# Patient Record
Sex: Male | Born: 1945 | Race: Black or African American | Hispanic: No | Smoking: Former smoker
Health system: Southern US, Community
[De-identification: ages and names within clinical notes are randomized; demographics above are authoritative.]

## PROBLEM LIST (undated history)

## (undated) DIAGNOSIS — R058 Adverse effect of angiotensin-converting-enzyme inhibitors, initial encounter: Secondary | ICD-10-CM

## (undated) DIAGNOSIS — I10 Essential (primary) hypertension: Secondary | ICD-10-CM

## (undated) DIAGNOSIS — R05 Cough: Secondary | ICD-10-CM

## (undated) DIAGNOSIS — I509 Heart failure, unspecified: Secondary | ICD-10-CM

## (undated) DIAGNOSIS — R002 Palpitations: Secondary | ICD-10-CM

## (undated) DIAGNOSIS — F1021 Alcohol dependence, in remission: Secondary | ICD-10-CM

## (undated) DIAGNOSIS — I2699 Other pulmonary embolism without acute cor pulmonale: Secondary | ICD-10-CM

## (undated) DIAGNOSIS — E1140 Type 2 diabetes mellitus with diabetic neuropathy, unspecified: Secondary | ICD-10-CM

## (undated) DIAGNOSIS — R0602 Shortness of breath: Secondary | ICD-10-CM

## (undated) DIAGNOSIS — J45909 Unspecified asthma, uncomplicated: Secondary | ICD-10-CM

## (undated) DIAGNOSIS — Z87891 Personal history of nicotine dependence: Secondary | ICD-10-CM

## (undated) DIAGNOSIS — D649 Anemia, unspecified: Secondary | ICD-10-CM

## (undated) DIAGNOSIS — T464X5A Adverse effect of angiotensin-converting-enzyme inhibitors, initial encounter: Secondary | ICD-10-CM

## (undated) DIAGNOSIS — G4730 Sleep apnea, unspecified: Secondary | ICD-10-CM

## (undated) DIAGNOSIS — M1990 Unspecified osteoarthritis, unspecified site: Secondary | ICD-10-CM

## (undated) DIAGNOSIS — H5440 Blindness, one eye, unspecified eye: Secondary | ICD-10-CM

## (undated) DIAGNOSIS — E11319 Type 2 diabetes mellitus with unspecified diabetic retinopathy without macular edema: Secondary | ICD-10-CM

## (undated) DIAGNOSIS — E119 Type 2 diabetes mellitus without complications: Secondary | ICD-10-CM

## (undated) DIAGNOSIS — N529 Male erectile dysfunction, unspecified: Secondary | ICD-10-CM

## (undated) DIAGNOSIS — E7800 Pure hypercholesterolemia, unspecified: Secondary | ICD-10-CM

## (undated) DIAGNOSIS — C61 Malignant neoplasm of prostate: Secondary | ICD-10-CM

## (undated) DIAGNOSIS — F411 Generalized anxiety disorder: Secondary | ICD-10-CM

## (undated) HISTORY — PX: BACK SURGERY: SHX140

## (undated) HISTORY — DX: Pure hypercholesterolemia, unspecified: E78.00

## (undated) HISTORY — DX: Essential (primary) hypertension: I10

## (undated) HISTORY — DX: Unspecified osteoarthritis, unspecified site: M19.90

## (undated) HISTORY — DX: Palpitations: R00.2

## (undated) HISTORY — DX: Type 2 diabetes mellitus with diabetic neuropathy, unspecified: E11.40

## (undated) HISTORY — DX: Other pulmonary embolism without acute cor pulmonale: I26.99

## (undated) HISTORY — DX: Type 2 diabetes mellitus without complications: E11.9

## (undated) HISTORY — DX: Personal history of nicotine dependence: Z87.891

## (undated) HISTORY — DX: Alcohol dependence, in remission: F10.21

## (undated) HISTORY — DX: Unspecified asthma, uncomplicated: J45.909

## (undated) HISTORY — DX: Male erectile dysfunction, unspecified: N52.9

## (undated) HISTORY — DX: Morbid (severe) obesity due to excess calories: E66.01

## (undated) HISTORY — DX: Adverse effect of angiotensin-converting-enzyme inhibitors, initial encounter: R05.8

## (undated) HISTORY — DX: Malignant neoplasm of prostate: C61

## (undated) HISTORY — PX: COLONOSCOPY: SHX174

## (undated) HISTORY — DX: Adverse effect of angiotensin-converting-enzyme inhibitors, initial encounter: T46.4X5A

## (undated) HISTORY — DX: Generalized anxiety disorder: F41.1

## (undated) HISTORY — DX: Anemia, unspecified: D64.9

## (undated) HISTORY — PX: TRACHEOSTOMY: SUR1362

## (undated) HISTORY — DX: Blindness, one eye, unspecified eye: H54.40

## (undated) HISTORY — DX: Type 2 diabetes mellitus with unspecified diabetic retinopathy without macular edema: E11.319

## (undated) HISTORY — DX: Heart failure, unspecified: I50.9

## (undated) HISTORY — DX: Cough: R05

---

## 1997-10-30 ENCOUNTER — Ambulatory Visit (HOSPITAL_COMMUNITY): Admission: RE | Admit: 1997-10-30 | Discharge: 1997-10-30 | Payer: Self-pay | Admitting: Sports Medicine

## 1999-03-27 ENCOUNTER — Emergency Department (HOSPITAL_COMMUNITY): Admission: EM | Admit: 1999-03-27 | Discharge: 1999-03-28 | Payer: Self-pay | Admitting: *Deleted

## 1999-03-28 ENCOUNTER — Encounter: Payer: Self-pay | Admitting: Emergency Medicine

## 1999-04-08 ENCOUNTER — Ambulatory Visit (HOSPITAL_COMMUNITY): Admission: RE | Admit: 1999-04-08 | Discharge: 1999-04-08 | Payer: Self-pay | Admitting: Endocrinology

## 1999-04-08 ENCOUNTER — Encounter: Payer: Self-pay | Admitting: Endocrinology

## 1999-05-20 ENCOUNTER — Encounter: Payer: Self-pay | Admitting: Endocrinology

## 1999-05-20 ENCOUNTER — Ambulatory Visit (HOSPITAL_COMMUNITY): Admission: RE | Admit: 1999-05-20 | Discharge: 1999-05-20 | Payer: Self-pay | Admitting: Endocrinology

## 1999-06-27 ENCOUNTER — Encounter: Payer: Self-pay | Admitting: *Deleted

## 1999-06-27 ENCOUNTER — Ambulatory Visit (HOSPITAL_COMMUNITY): Admission: RE | Admit: 1999-06-27 | Discharge: 1999-06-27 | Payer: Self-pay | Admitting: *Deleted

## 1999-09-06 ENCOUNTER — Encounter: Admission: RE | Admit: 1999-09-06 | Discharge: 1999-09-06 | Payer: Self-pay | Admitting: Specialist

## 1999-09-06 ENCOUNTER — Encounter: Payer: Self-pay | Admitting: Specialist

## 1999-11-02 ENCOUNTER — Ambulatory Visit (HOSPITAL_COMMUNITY): Admission: RE | Admit: 1999-11-02 | Discharge: 1999-11-02 | Payer: Self-pay | Admitting: Cardiology

## 1999-11-02 ENCOUNTER — Encounter: Payer: Self-pay | Admitting: *Deleted

## 2000-11-16 ENCOUNTER — Inpatient Hospital Stay (HOSPITAL_COMMUNITY): Admission: EM | Admit: 2000-11-16 | Discharge: 2000-11-24 | Payer: Self-pay | Admitting: Cardiology

## 2000-11-16 ENCOUNTER — Encounter: Payer: Self-pay | Admitting: Cardiology

## 2000-11-17 ENCOUNTER — Encounter: Payer: Self-pay | Admitting: Cardiology

## 2002-09-13 ENCOUNTER — Emergency Department (HOSPITAL_COMMUNITY): Admission: EM | Admit: 2002-09-13 | Discharge: 2002-09-13 | Payer: Self-pay | Admitting: Emergency Medicine

## 2002-09-15 ENCOUNTER — Emergency Department (HOSPITAL_COMMUNITY): Admission: EM | Admit: 2002-09-15 | Discharge: 2002-09-16 | Payer: Self-pay | Admitting: Emergency Medicine

## 2002-09-16 ENCOUNTER — Ambulatory Visit (HOSPITAL_COMMUNITY): Admission: RE | Admit: 2002-09-16 | Discharge: 2002-09-16 | Payer: Self-pay | Admitting: Emergency Medicine

## 2002-10-23 ENCOUNTER — Encounter: Payer: Self-pay | Admitting: Endocrinology

## 2002-10-23 ENCOUNTER — Ambulatory Visit (HOSPITAL_COMMUNITY): Admission: RE | Admit: 2002-10-23 | Discharge: 2002-10-23 | Payer: Self-pay | Admitting: Endocrinology

## 2003-07-26 ENCOUNTER — Emergency Department (HOSPITAL_COMMUNITY): Admission: EM | Admit: 2003-07-26 | Discharge: 2003-07-26 | Payer: Self-pay | Admitting: Emergency Medicine

## 2003-07-29 ENCOUNTER — Encounter: Admission: RE | Admit: 2003-07-29 | Discharge: 2003-07-29 | Payer: Self-pay | Admitting: Orthopedic Surgery

## 2003-11-18 ENCOUNTER — Ambulatory Visit: Payer: Self-pay | Admitting: Endocrinology

## 2003-12-04 ENCOUNTER — Ambulatory Visit: Payer: Self-pay | Admitting: Endocrinology

## 2004-03-18 ENCOUNTER — Ambulatory Visit: Payer: Self-pay | Admitting: Endocrinology

## 2004-03-23 ENCOUNTER — Ambulatory Visit: Payer: Self-pay | Admitting: Endocrinology

## 2004-03-30 ENCOUNTER — Ambulatory Visit: Payer: Self-pay | Admitting: Endocrinology

## 2004-04-11 ENCOUNTER — Ambulatory Visit: Payer: Self-pay | Admitting: Internal Medicine

## 2004-04-20 ENCOUNTER — Ambulatory Visit: Payer: Self-pay | Admitting: Internal Medicine

## 2004-04-20 LAB — HM COLONOSCOPY

## 2004-06-23 ENCOUNTER — Ambulatory Visit: Payer: Self-pay | Admitting: Endocrinology

## 2004-06-30 ENCOUNTER — Ambulatory Visit: Payer: Self-pay | Admitting: Endocrinology

## 2004-07-06 ENCOUNTER — Ambulatory Visit: Payer: Self-pay | Admitting: Internal Medicine

## 2004-07-06 ENCOUNTER — Ambulatory Visit: Payer: Self-pay | Admitting: Cardiology

## 2004-07-06 ENCOUNTER — Encounter: Payer: Self-pay | Admitting: Cardiology

## 2004-07-06 ENCOUNTER — Inpatient Hospital Stay (HOSPITAL_COMMUNITY): Admission: EM | Admit: 2004-07-06 | Discharge: 2004-07-29 | Payer: Self-pay | Admitting: Emergency Medicine

## 2004-07-08 ENCOUNTER — Ambulatory Visit: Payer: Self-pay | Admitting: Internal Medicine

## 2004-07-10 ENCOUNTER — Encounter: Payer: Self-pay | Admitting: Critical Care Medicine

## 2004-08-16 ENCOUNTER — Ambulatory Visit (HOSPITAL_COMMUNITY): Admission: RE | Admit: 2004-08-16 | Discharge: 2004-08-16 | Payer: Self-pay | Admitting: Internal Medicine

## 2004-09-07 ENCOUNTER — Ambulatory Visit: Payer: Self-pay | Admitting: Internal Medicine

## 2004-09-07 ENCOUNTER — Ambulatory Visit: Payer: Self-pay | Admitting: Endocrinology

## 2004-09-09 ENCOUNTER — Ambulatory Visit: Payer: Self-pay | Admitting: Endocrinology

## 2004-09-14 ENCOUNTER — Ambulatory Visit: Payer: Self-pay | Admitting: *Deleted

## 2004-09-20 ENCOUNTER — Ambulatory Visit: Payer: Self-pay | Admitting: Endocrinology

## 2004-09-23 ENCOUNTER — Ambulatory Visit: Payer: Self-pay | Admitting: Endocrinology

## 2004-09-30 ENCOUNTER — Ambulatory Visit: Payer: Self-pay | Admitting: Cardiology

## 2004-10-03 ENCOUNTER — Ambulatory Visit: Payer: Self-pay | Admitting: Cardiology

## 2004-10-07 ENCOUNTER — Ambulatory Visit: Payer: Self-pay | Admitting: Internal Medicine

## 2004-10-11 ENCOUNTER — Ambulatory Visit: Payer: Self-pay | Admitting: *Deleted

## 2004-10-25 ENCOUNTER — Ambulatory Visit: Payer: Self-pay | Admitting: Cardiovascular Disease

## 2004-10-28 ENCOUNTER — Ambulatory Visit: Payer: Self-pay | Admitting: Internal Medicine

## 2004-11-12 ENCOUNTER — Emergency Department (HOSPITAL_COMMUNITY): Admission: EM | Admit: 2004-11-12 | Discharge: 2004-11-12 | Payer: Self-pay | Admitting: Emergency Medicine

## 2004-11-15 ENCOUNTER — Ambulatory Visit: Payer: Self-pay | Admitting: Internal Medicine

## 2004-11-15 ENCOUNTER — Ambulatory Visit: Payer: Self-pay | Admitting: Cardiology

## 2004-11-25 ENCOUNTER — Ambulatory Visit: Payer: Self-pay | Admitting: Cardiology

## 2004-12-19 ENCOUNTER — Ambulatory Visit: Payer: Self-pay | Admitting: Cardiology

## 2005-01-10 ENCOUNTER — Ambulatory Visit: Payer: Self-pay | Admitting: Internal Medicine

## 2005-01-11 ENCOUNTER — Ambulatory Visit: Payer: Self-pay | Admitting: Internal Medicine

## 2005-02-03 ENCOUNTER — Ambulatory Visit: Payer: Self-pay | Admitting: Cardiology

## 2005-03-10 ENCOUNTER — Ambulatory Visit: Payer: Self-pay | Admitting: Internal Medicine

## 2005-03-31 ENCOUNTER — Ambulatory Visit: Payer: Self-pay | Admitting: Endocrinology

## 2005-04-07 ENCOUNTER — Ambulatory Visit: Payer: Self-pay | Admitting: Endocrinology

## 2005-04-07 ENCOUNTER — Ambulatory Visit: Payer: Self-pay | Admitting: Cardiovascular Disease

## 2005-04-28 ENCOUNTER — Ambulatory Visit: Payer: Self-pay | Admitting: Cardiology

## 2005-05-05 ENCOUNTER — Ambulatory Visit: Payer: Self-pay | Admitting: Endocrinology

## 2005-05-29 ENCOUNTER — Ambulatory Visit: Payer: Self-pay | Admitting: Endocrinology

## 2005-06-01 ENCOUNTER — Ambulatory Visit: Payer: Self-pay | Admitting: Endocrinology

## 2005-06-28 ENCOUNTER — Ambulatory Visit: Payer: Self-pay | Admitting: Cardiovascular Disease

## 2005-06-28 ENCOUNTER — Emergency Department (HOSPITAL_COMMUNITY): Admission: EM | Admit: 2005-06-28 | Discharge: 2005-06-28 | Payer: Self-pay | Admitting: Emergency Medicine

## 2005-06-30 ENCOUNTER — Ambulatory Visit: Payer: Self-pay | Admitting: Cardiology

## 2005-07-07 ENCOUNTER — Ambulatory Visit: Payer: Self-pay | Admitting: Endocrinology

## 2005-07-07 ENCOUNTER — Ambulatory Visit: Payer: Self-pay | Admitting: Internal Medicine

## 2005-09-04 ENCOUNTER — Ambulatory Visit: Payer: Self-pay | Admitting: Internal Medicine

## 2005-10-02 ENCOUNTER — Ambulatory Visit: Payer: Self-pay | Admitting: Cardiology

## 2005-10-05 ENCOUNTER — Ambulatory Visit: Payer: Self-pay | Admitting: Cardiology

## 2005-10-06 ENCOUNTER — Ambulatory Visit: Payer: Self-pay | Admitting: Endocrinology

## 2005-10-10 ENCOUNTER — Ambulatory Visit: Payer: Self-pay | Admitting: Cardiovascular Disease

## 2005-10-23 ENCOUNTER — Ambulatory Visit: Admission: RE | Admit: 2005-10-23 | Discharge: 2005-10-23 | Payer: Self-pay | Admitting: Endocrinology

## 2005-10-27 ENCOUNTER — Ambulatory Visit: Payer: Self-pay | Admitting: Endocrinology

## 2005-11-20 ENCOUNTER — Ambulatory Visit: Payer: Self-pay | Admitting: Cardiology

## 2005-11-23 ENCOUNTER — Ambulatory Visit: Payer: Self-pay | Admitting: Endocrinology

## 2005-12-01 ENCOUNTER — Emergency Department (HOSPITAL_COMMUNITY): Admission: EM | Admit: 2005-12-01 | Discharge: 2005-12-01 | Payer: Self-pay | Admitting: Emergency Medicine

## 2005-12-18 ENCOUNTER — Ambulatory Visit: Payer: Self-pay | Admitting: Internal Medicine

## 2006-01-23 ENCOUNTER — Ambulatory Visit: Payer: Self-pay | Admitting: Cardiology

## 2006-02-20 ENCOUNTER — Ambulatory Visit: Payer: Self-pay | Admitting: Cardiology

## 2006-02-27 ENCOUNTER — Ambulatory Visit: Payer: Self-pay | Admitting: Cardiology

## 2006-03-27 ENCOUNTER — Ambulatory Visit: Payer: Self-pay | Admitting: Cardiology

## 2006-04-19 ENCOUNTER — Ambulatory Visit: Payer: Self-pay | Admitting: Endocrinology

## 2006-04-19 LAB — CONVERTED CEMR LAB
ALT: 13 units/L (ref 0–40)
AST: 18 units/L (ref 0–37)
Albumin: 3.2 g/dL — ABNORMAL LOW (ref 3.5–5.2)
Alkaline Phosphatase: 64 units/L (ref 39–117)
BUN: 22 mg/dL (ref 6–23)
Bacteria, UA: NEGATIVE
Basophils Absolute: 0.1 10*3/uL (ref 0.0–0.1)
Basophils Relative: 0.7 % (ref 0.0–1.0)
Bilirubin Urine: NEGATIVE
Bilirubin, Direct: 0.1 mg/dL (ref 0.0–0.3)
CO2: 32 meq/L (ref 19–32)
Calcium: 9.4 mg/dL (ref 8.4–10.5)
Chloride: 106 meq/L (ref 96–112)
Cholesterol: 125 mg/dL (ref 0–200)
Creatinine, Ser: 1.2 mg/dL (ref 0.4–1.5)
Creatinine,U: 296.1 mg/dL
Crystals: NEGATIVE
Eosinophils Absolute: 0.1 10*3/uL (ref 0.0–0.6)
Eosinophils Relative: 1.8 % (ref 0.0–5.0)
GFR calc Af Amer: 79 mL/min
GFR calc non Af Amer: 66 mL/min
Glucose, Bld: 100 mg/dL — ABNORMAL HIGH (ref 70–99)
HCT: 36.9 % — ABNORMAL LOW (ref 39.0–52.0)
HDL: 43.7 mg/dL (ref >39.0)
Hemoglobin, Urine: NEGATIVE
Hemoglobin: 12.3 g/dL — ABNORMAL LOW (ref 13.0–17.0)
Hgb A1c MFr Bld: 6 % (ref 4.6–6.0)
Ketones, ur: NEGATIVE mg/dL
LDL Cholesterol: 70 mg/dL (ref 0–99)
Leukocytes, UA: NEGATIVE
Lymphocytes Relative: 20.2 % (ref 12.0–46.0)
MCHC: 33.4 g/dL (ref 30.0–36.0)
MCV: 86.5 fL (ref 78.0–100.0)
Microalb Creat Ratio: 24.3 mg/g (ref 0.0–30.0)
Microalb, Ur: 7.2 mg/dL — ABNORMAL HIGH (ref 0.0–1.9)
Monocytes Absolute: 0.5 10*3/uL (ref 0.2–0.7)
Monocytes Relative: 6.2 % (ref 3.0–11.0)
Neutro Abs: 5.5 10*3/uL (ref 1.4–7.7)
Neutrophils Relative %: 71.1 % (ref 43.0–77.0)
Nitrite: NEGATIVE
PSA: 2.3 ng/mL (ref 0.10–4.00)
Platelets: 236 10*3/uL (ref 150–400)
Potassium: 4 meq/L (ref 3.5–5.1)
RBC: 4.27 M/uL (ref 4.22–5.81)
RDW: 13.1 % (ref 11.5–14.6)
Sodium: 142 meq/L (ref 135–145)
Specific Gravity, Urine: >=1.03 (ref 1.000–1.03)
TSH: 1.12 microintl units/mL (ref 0.35–5.50)
Total Bilirubin: 0.8 mg/dL (ref 0.3–1.2)
Total CHOL/HDL Ratio: 2.9
Total Protein: 7.1 g/dL (ref 6.0–8.3)
Triglycerides: 56 mg/dL (ref 0–149)
Urine Glucose: NEGATIVE mg/dL
Urobilinogen, UA: 0.2 (ref 0.0–1.0)
VLDL: 11 mg/dL (ref 0–40)
Vitamin B-12: 949 pg/mL — ABNORMAL HIGH (ref 211–911)
WBC: 7.8 10*3/uL (ref 4.5–10.5)
pH: 6 (ref 5.0–8.0)

## 2006-04-24 ENCOUNTER — Ambulatory Visit: Payer: Self-pay | Admitting: Cardiology

## 2006-05-28 ENCOUNTER — Ambulatory Visit: Payer: Self-pay | Admitting: Internal Medicine

## 2006-05-29 ENCOUNTER — Ambulatory Visit: Payer: Self-pay | Admitting: Endocrinology

## 2006-06-28 ENCOUNTER — Ambulatory Visit: Payer: Self-pay | Admitting: Cardiology

## 2006-07-23 ENCOUNTER — Encounter: Payer: Self-pay | Admitting: Endocrinology

## 2006-07-23 DIAGNOSIS — J45909 Unspecified asthma, uncomplicated: Secondary | ICD-10-CM | POA: Insufficient documentation

## 2006-07-23 DIAGNOSIS — F411 Generalized anxiety disorder: Secondary | ICD-10-CM

## 2006-07-23 DIAGNOSIS — M1990 Unspecified osteoarthritis, unspecified site: Secondary | ICD-10-CM | POA: Insufficient documentation

## 2006-07-23 DIAGNOSIS — I10 Essential (primary) hypertension: Secondary | ICD-10-CM

## 2006-07-23 HISTORY — DX: Unspecified asthma, uncomplicated: J45.909

## 2006-07-23 HISTORY — DX: Essential (primary) hypertension: I10

## 2006-07-23 HISTORY — DX: Unspecified osteoarthritis, unspecified site: M19.90

## 2006-07-23 HISTORY — DX: Generalized anxiety disorder: F41.1

## 2006-07-25 ENCOUNTER — Ambulatory Visit: Payer: Self-pay | Admitting: Cardiology

## 2006-08-16 ENCOUNTER — Ambulatory Visit: Payer: Self-pay | Admitting: Cardiology

## 2006-08-29 ENCOUNTER — Ambulatory Visit: Payer: Self-pay | Admitting: Endocrinology

## 2006-08-29 LAB — CONVERTED CEMR LAB
ALT: 20 units/L (ref 0–53)
AST: 19 units/L (ref 0–37)
Albumin: 3.2 g/dL — ABNORMAL LOW (ref 3.5–5.2)
Alkaline Phosphatase: 67 units/L (ref 39–117)
Bilirubin, Direct: 0.1 mg/dL (ref 0.0–0.3)
Cholesterol: 87 mg/dL (ref 0–200)
HDL: 38.9 mg/dL — ABNORMAL LOW (ref >39.0)
Hgb A1c MFr Bld: 6.6 % — ABNORMAL HIGH (ref 4.6–6.0)
LDL Cholesterol: 42 mg/dL (ref 0–99)
Total Bilirubin: 0.8 mg/dL (ref 0.3–1.2)
Total CHOL/HDL Ratio: 2.2
Total Protein: 7.1 g/dL (ref 6.0–8.3)
Triglycerides: 30 mg/dL (ref 0–149)
VLDL: 6 mg/dL (ref 0–40)

## 2006-08-30 ENCOUNTER — Ambulatory Visit: Payer: Self-pay | Admitting: Cardiovascular Disease

## 2006-09-20 ENCOUNTER — Ambulatory Visit: Payer: Self-pay | Admitting: Cardiology

## 2006-09-26 ENCOUNTER — Ambulatory Visit: Payer: Self-pay | Admitting: Endocrinology

## 2006-10-30 ENCOUNTER — Ambulatory Visit: Payer: Self-pay | Admitting: Cardiology

## 2006-11-27 ENCOUNTER — Ambulatory Visit: Payer: Self-pay | Admitting: Cardiovascular Disease

## 2007-02-04 ENCOUNTER — Ambulatory Visit: Payer: Self-pay | Admitting: Cardiology

## 2007-03-13 ENCOUNTER — Ambulatory Visit: Payer: Self-pay | Admitting: Internal Medicine

## 2007-05-21 ENCOUNTER — Encounter: Payer: Self-pay | Admitting: Endocrinology

## 2007-05-23 ENCOUNTER — Ambulatory Visit: Payer: Self-pay | Admitting: Endocrinology

## 2007-05-23 DIAGNOSIS — E7800 Pure hypercholesterolemia, unspecified: Secondary | ICD-10-CM | POA: Insufficient documentation

## 2007-05-23 DIAGNOSIS — R9431 Abnormal electrocardiogram [ECG] [EKG]: Secondary | ICD-10-CM | POA: Insufficient documentation

## 2007-05-23 DIAGNOSIS — I428 Other cardiomyopathies: Secondary | ICD-10-CM

## 2007-05-23 DIAGNOSIS — M25519 Pain in unspecified shoulder: Secondary | ICD-10-CM

## 2007-05-23 DIAGNOSIS — E119 Type 2 diabetes mellitus without complications: Secondary | ICD-10-CM

## 2007-05-23 HISTORY — DX: Pure hypercholesterolemia, unspecified: E78.00

## 2007-05-23 HISTORY — DX: Type 2 diabetes mellitus without complications: E11.9

## 2007-05-24 ENCOUNTER — Telehealth: Payer: Self-pay | Admitting: Endocrinology

## 2007-05-27 ENCOUNTER — Encounter: Payer: Self-pay | Admitting: Endocrinology

## 2007-05-27 LAB — CONVERTED CEMR LAB
ALT: 20 units/L (ref 0–53)
AST: 25 units/L (ref 0–37)
Albumin: 3.7 g/dL (ref 3.5–5.2)
Alkaline Phosphatase: 65 units/L (ref 39–117)
BUN: 21 mg/dL (ref 6–23)
Basophils Absolute: 0 10*3/uL (ref 0.0–0.1)
Basophils Relative: 0.5 % (ref 0.0–1.0)
Bilirubin Urine: NEGATIVE
Bilirubin, Direct: 0.1 mg/dL (ref 0.0–0.3)
CO2: 30 meq/L (ref 19–32)
Calcium: 9.5 mg/dL (ref 8.4–10.5)
Chloride: 104 meq/L (ref 96–112)
Cholesterol: 104 mg/dL (ref 0–200)
Creatinine, Ser: 1.3 mg/dL (ref 0.4–1.5)
Creatinine,U: 291.2 mg/dL
Crystals: NEGATIVE
Eosinophils Absolute: 0.1 10*3/uL (ref 0.0–0.7)
Eosinophils Relative: 1 % (ref 0.0–5.0)
GFR calc Af Amer: 72 mL/min
GFR calc non Af Amer: 60 mL/min
Glucose, Bld: 101 mg/dL — ABNORMAL HIGH (ref 70–99)
HCT: 39.5 % (ref 39.0–52.0)
HDL: 38.5 mg/dL — ABNORMAL LOW (ref >39.0)
Hemoglobin, Urine: NEGATIVE
Hemoglobin: 12.8 g/dL — ABNORMAL LOW (ref 13.0–17.0)
Hgb A1c MFr Bld: 6.4 % — ABNORMAL HIGH (ref 4.6–6.0)
Ketones, ur: NEGATIVE mg/dL
LDL Cholesterol: 56 mg/dL (ref 0–99)
Leukocytes, UA: NEGATIVE
Lymphocytes Relative: 17 % (ref 12.0–46.0)
MCHC: 32.5 g/dL (ref 30.0–36.0)
MCV: 85.6 fL (ref 78.0–100.0)
Microalb Creat Ratio: 76.9 mg/g — ABNORMAL HIGH (ref 0.0–30.0)
Microalb, Ur: 22.4 mg/dL — ABNORMAL HIGH (ref 0.0–1.9)
Monocytes Absolute: 0.4 10*3/uL (ref 0.1–1.0)
Monocytes Relative: 5.4 % (ref 3.0–12.0)
Neutro Abs: 5.4 10*3/uL (ref 1.4–7.7)
Neutrophils Relative %: 76.1 % (ref 43.0–77.0)
Nitrite: NEGATIVE
PSA: 4.36 ng/mL — ABNORMAL HIGH (ref 0.10–4.00)
Platelets: 189 10*3/uL (ref 150–400)
Potassium: 3.3 meq/L — ABNORMAL LOW (ref 3.5–5.1)
RBC: 4.61 M/uL (ref 4.22–5.81)
RDW: 12.5 % (ref 11.5–14.6)
Sed Rate: 59 mm/hr — ABNORMAL HIGH (ref 0–16)
Sodium: 144 meq/L (ref 135–145)
Specific Gravity, Urine: >=1.03 (ref 1.000–1.03)
TSH: 1.25 microintl units/mL (ref 0.35–5.50)
Total Bilirubin: 0.9 mg/dL (ref 0.3–1.2)
Total CHOL/HDL Ratio: 2.7
Total Protein, Urine: 100 mg/dL — AB
Total Protein: 7.5 g/dL (ref 6.0–8.3)
Triglycerides: 49 mg/dL (ref 0–149)
Urine Glucose: NEGATIVE mg/dL
Urobilinogen, UA: 1 (ref 0.0–1.0)
VLDL: 10 mg/dL (ref 0–40)
WBC: 7.1 10*3/uL (ref 4.5–10.5)
pH: 6 (ref 5.0–8.0)

## 2007-05-29 ENCOUNTER — Ambulatory Visit: Payer: Self-pay | Admitting: Cardiology

## 2007-06-12 ENCOUNTER — Ambulatory Visit: Payer: Self-pay | Admitting: Cardiology

## 2007-06-18 ENCOUNTER — Ambulatory Visit: Payer: Self-pay | Admitting: Cardiology

## 2007-06-18 LAB — CONVERTED CEMR LAB: Pro B Natriuretic peptide (BNP): 27 pg/mL (ref 0.0–100.0)

## 2007-07-03 ENCOUNTER — Encounter: Payer: Self-pay | Admitting: Cardiology

## 2007-07-03 ENCOUNTER — Ambulatory Visit: Payer: Self-pay

## 2007-07-08 ENCOUNTER — Ambulatory Visit: Payer: Self-pay

## 2007-08-29 ENCOUNTER — Ambulatory Visit: Payer: Self-pay | Admitting: Endocrinology

## 2007-08-29 DIAGNOSIS — R0609 Other forms of dyspnea: Secondary | ICD-10-CM

## 2007-08-29 DIAGNOSIS — R0989 Other specified symptoms and signs involving the circulatory and respiratory systems: Secondary | ICD-10-CM

## 2007-09-09 ENCOUNTER — Ambulatory Visit: Payer: Self-pay | Admitting: Internal Medicine

## 2007-09-16 ENCOUNTER — Telehealth (INDEPENDENT_AMBULATORY_CARE_PROVIDER_SITE_OTHER): Payer: Self-pay | Admitting: *Deleted

## 2007-10-04 ENCOUNTER — Encounter: Payer: Self-pay | Admitting: Internal Medicine

## 2007-10-11 ENCOUNTER — Telehealth (INDEPENDENT_AMBULATORY_CARE_PROVIDER_SITE_OTHER): Payer: Self-pay | Admitting: *Deleted

## 2007-10-21 ENCOUNTER — Ambulatory Visit: Payer: Self-pay | Admitting: Endocrinology

## 2007-12-02 ENCOUNTER — Ambulatory Visit: Payer: Self-pay | Admitting: Cardiology

## 2008-01-01 ENCOUNTER — Telehealth (INDEPENDENT_AMBULATORY_CARE_PROVIDER_SITE_OTHER): Payer: Self-pay | Admitting: *Deleted

## 2008-01-23 ENCOUNTER — Telehealth (INDEPENDENT_AMBULATORY_CARE_PROVIDER_SITE_OTHER): Payer: Self-pay | Admitting: *Deleted

## 2008-01-27 ENCOUNTER — Telehealth (INDEPENDENT_AMBULATORY_CARE_PROVIDER_SITE_OTHER): Payer: Self-pay | Admitting: *Deleted

## 2008-01-30 ENCOUNTER — Ambulatory Visit: Payer: Self-pay | Admitting: Endocrinology

## 2008-01-30 LAB — CONVERTED CEMR LAB: Hgb A1c MFr Bld: 7 % — ABNORMAL HIGH (ref 4.6–6.0)

## 2008-02-04 ENCOUNTER — Telehealth (INDEPENDENT_AMBULATORY_CARE_PROVIDER_SITE_OTHER): Payer: Self-pay | Admitting: *Deleted

## 2008-02-10 ENCOUNTER — Ambulatory Visit: Payer: Self-pay | Admitting: Endocrinology

## 2008-02-10 DIAGNOSIS — R002 Palpitations: Secondary | ICD-10-CM

## 2008-02-10 HISTORY — DX: Palpitations: R00.2

## 2008-03-02 ENCOUNTER — Telehealth (INDEPENDENT_AMBULATORY_CARE_PROVIDER_SITE_OTHER): Payer: Self-pay | Admitting: *Deleted

## 2008-04-14 ENCOUNTER — Telehealth: Payer: Self-pay | Admitting: Endocrinology

## 2008-05-18 ENCOUNTER — Encounter: Payer: Self-pay | Admitting: Internal Medicine

## 2008-06-16 ENCOUNTER — Encounter: Payer: Self-pay | Admitting: *Deleted

## 2008-06-16 ENCOUNTER — Telehealth: Payer: Self-pay | Admitting: Endocrinology

## 2008-07-22 ENCOUNTER — Encounter: Payer: Self-pay | Admitting: *Deleted

## 2008-07-31 ENCOUNTER — Telehealth: Payer: Self-pay | Admitting: Endocrinology

## 2008-08-07 ENCOUNTER — Telehealth: Payer: Self-pay | Admitting: Endocrinology

## 2008-08-26 ENCOUNTER — Encounter (INDEPENDENT_AMBULATORY_CARE_PROVIDER_SITE_OTHER): Payer: Self-pay | Admitting: *Deleted

## 2008-08-26 ENCOUNTER — Ambulatory Visit: Payer: Self-pay | Admitting: Endocrinology

## 2008-08-26 ENCOUNTER — Encounter: Payer: Self-pay | Admitting: Cardiology

## 2008-08-26 DIAGNOSIS — Z87891 Personal history of nicotine dependence: Secondary | ICD-10-CM | POA: Insufficient documentation

## 2008-08-26 DIAGNOSIS — F1021 Alcohol dependence, in remission: Secondary | ICD-10-CM

## 2008-08-26 HISTORY — DX: Alcohol dependence, in remission: F10.21

## 2008-08-26 HISTORY — DX: Personal history of nicotine dependence: Z87.891

## 2008-08-31 ENCOUNTER — Telehealth: Payer: Self-pay | Admitting: Endocrinology

## 2008-08-31 DIAGNOSIS — D649 Anemia, unspecified: Secondary | ICD-10-CM

## 2008-08-31 DIAGNOSIS — R809 Proteinuria, unspecified: Secondary | ICD-10-CM | POA: Insufficient documentation

## 2008-08-31 HISTORY — DX: Anemia, unspecified: D64.9

## 2008-08-31 LAB — CONVERTED CEMR LAB
ALT: 19 units/L (ref 0–53)
AST: 21 units/L (ref 0–37)
Albumin: 3.7 g/dL (ref 3.5–5.2)
Alkaline Phosphatase: 64 units/L (ref 39–117)
BUN: 27 mg/dL — ABNORMAL HIGH (ref 6–23)
Basophils Absolute: 0.1 10*3/uL (ref 0.0–0.1)
Basophils Relative: 0.8 % (ref 0.0–3.0)
Bilirubin Urine: NEGATIVE
Bilirubin, Direct: 0.2 mg/dL (ref 0.0–0.3)
CO2: 34 meq/L — ABNORMAL HIGH (ref 19–32)
Calcium: 9.5 mg/dL (ref 8.4–10.5)
Chloride: 102 meq/L (ref 96–112)
Cholesterol: 97 mg/dL (ref 0–200)
Creatinine, Ser: 1.4 mg/dL (ref 0.4–1.5)
Creatinine,U: 190.9 mg/dL
Eosinophils Absolute: 0.1 10*3/uL (ref 0.0–0.7)
Eosinophils Relative: 1.5 % (ref 0.0–5.0)
GFR calc non Af Amer: 65.93 mL/min (ref >60)
Glucose, Bld: 68 mg/dL — ABNORMAL LOW (ref 70–99)
HCT: 38 % — ABNORMAL LOW (ref 39.0–52.0)
HDL: 42.4 mg/dL (ref >39.00)
Hemoglobin, Urine: NEGATIVE
Hemoglobin: 12.9 g/dL — ABNORMAL LOW (ref 13.0–17.0)
Hgb A1c MFr Bld: 6.7 % — ABNORMAL HIGH (ref 4.6–6.5)
Ketones, ur: NEGATIVE mg/dL
LDL Cholesterol: 46 mg/dL (ref 0–99)
Leukocytes, UA: NEGATIVE
Lymphocytes Relative: 30.1 % (ref 12.0–46.0)
Lymphs Abs: 2.2 10*3/uL (ref 0.7–4.0)
MCHC: 34 g/dL (ref 30.0–36.0)
MCV: 86.2 fL (ref 78.0–100.0)
Microalb Creat Ratio: 79.6 mg/g — ABNORMAL HIGH (ref 0.0–30.0)
Microalb, Ur: 15.2 mg/dL — ABNORMAL HIGH (ref 0.0–1.9)
Monocytes Absolute: 0.4 10*3/uL (ref 0.1–1.0)
Monocytes Relative: 5.3 % (ref 3.0–12.0)
Neutro Abs: 4.4 10*3/uL (ref 1.4–7.7)
Neutrophils Relative %: 62.3 % (ref 43.0–77.0)
Nitrite: NEGATIVE
PSA: 6.23 ng/mL — ABNORMAL HIGH (ref 0.10–4.00)
Platelets: 189 10*3/uL (ref 150.0–400.0)
Potassium: 3.5 meq/L (ref 3.5–5.1)
RBC: 4.4 M/uL (ref 4.22–5.81)
RDW: 13.3 % (ref 11.5–14.6)
Sodium: 142 meq/L (ref 135–145)
Specific Gravity, Urine: 1.025 (ref 1.000–1.030)
TSH: 2.28 microintl units/mL (ref 0.35–5.50)
Total Bilirubin: 0.7 mg/dL (ref 0.3–1.2)
Total CHOL/HDL Ratio: 2
Total Protein, Urine: 30 mg/dL
Total Protein: 7.4 g/dL (ref 6.0–8.3)
Triglycerides: 41 mg/dL (ref 0.0–149.0)
Urine Glucose: NEGATIVE mg/dL
Urobilinogen, UA: 0.2 (ref 0.0–1.0)
VLDL: 8.2 mg/dL (ref 0.0–40.0)
WBC: 7.2 10*3/uL (ref 4.5–10.5)
pH: 6 (ref 5.0–8.0)

## 2008-09-22 ENCOUNTER — Encounter: Payer: Self-pay | Admitting: Endocrinology

## 2008-09-29 ENCOUNTER — Telehealth: Payer: Self-pay | Admitting: Endocrinology

## 2008-10-26 ENCOUNTER — Telehealth: Payer: Self-pay | Admitting: Endocrinology

## 2008-11-04 ENCOUNTER — Ambulatory Visit: Payer: Self-pay | Admitting: Internal Medicine

## 2008-11-04 LAB — CONVERTED CEMR LAB: POC INR: 3.5

## 2008-11-05 ENCOUNTER — Telehealth: Payer: Self-pay | Admitting: Endocrinology

## 2008-11-23 ENCOUNTER — Ambulatory Visit: Payer: Self-pay | Admitting: Endocrinology

## 2008-11-23 DIAGNOSIS — R635 Abnormal weight gain: Secondary | ICD-10-CM | POA: Insufficient documentation

## 2008-11-23 LAB — CONVERTED CEMR LAB: Hgb A1c MFr Bld: 7 % — ABNORMAL HIGH (ref 4.6–6.5)

## 2008-11-25 ENCOUNTER — Ambulatory Visit: Payer: Self-pay | Admitting: Internal Medicine

## 2008-11-25 LAB — CONVERTED CEMR LAB: POC INR: 3.7

## 2008-11-30 ENCOUNTER — Telehealth: Payer: Self-pay | Admitting: Endocrinology

## 2008-12-09 ENCOUNTER — Ambulatory Visit: Payer: Self-pay | Admitting: Internal Medicine

## 2008-12-09 LAB — CONVERTED CEMR LAB: POC INR: 2.3

## 2008-12-14 ENCOUNTER — Ambulatory Visit: Payer: Self-pay | Admitting: Cardiology

## 2008-12-14 LAB — CONVERTED CEMR LAB: POC INR: 1.4

## 2009-01-04 ENCOUNTER — Ambulatory Visit (HOSPITAL_COMMUNITY): Admission: RE | Admit: 2009-01-04 | Discharge: 2009-01-04 | Payer: Self-pay | Admitting: Urology

## 2009-01-26 ENCOUNTER — Encounter: Payer: Self-pay | Admitting: Endocrinology

## 2009-02-01 ENCOUNTER — Ambulatory Visit: Admission: RE | Admit: 2009-02-01 | Discharge: 2009-05-02 | Payer: Self-pay | Admitting: Radiation Oncology

## 2009-02-02 ENCOUNTER — Encounter: Payer: Self-pay | Admitting: Endocrinology

## 2009-02-08 ENCOUNTER — Telehealth: Payer: Self-pay | Admitting: Endocrinology

## 2009-02-08 ENCOUNTER — Encounter: Payer: Self-pay | Admitting: Endocrinology

## 2009-02-19 ENCOUNTER — Ambulatory Visit: Payer: Self-pay | Admitting: Endocrinology

## 2009-02-24 ENCOUNTER — Ambulatory Visit: Payer: Self-pay | Admitting: Cardiology

## 2009-02-24 LAB — CONVERTED CEMR LAB: POC INR: 2.2

## 2009-03-03 ENCOUNTER — Encounter: Payer: Self-pay | Admitting: Internal Medicine

## 2009-03-08 ENCOUNTER — Ambulatory Visit: Payer: Self-pay | Admitting: Internal Medicine

## 2009-03-08 LAB — CONVERTED CEMR LAB: POC INR: 1.2

## 2009-03-09 ENCOUNTER — Encounter: Payer: Self-pay | Admitting: Endocrinology

## 2009-03-22 ENCOUNTER — Ambulatory Visit: Payer: Self-pay | Admitting: Endocrinology

## 2009-04-21 ENCOUNTER — Ambulatory Visit: Payer: Self-pay | Admitting: Endocrinology

## 2009-04-26 ENCOUNTER — Telehealth: Payer: Self-pay | Admitting: Endocrinology

## 2009-04-28 ENCOUNTER — Ambulatory Visit: Payer: Self-pay | Admitting: Internal Medicine

## 2009-04-28 LAB — CONVERTED CEMR LAB: POC INR: 1.5

## 2009-05-03 ENCOUNTER — Ambulatory Visit: Admission: RE | Admit: 2009-05-03 | Discharge: 2009-05-25 | Payer: Self-pay | Admitting: Radiation Oncology

## 2009-05-18 ENCOUNTER — Telehealth: Payer: Self-pay | Admitting: Endocrinology

## 2009-05-24 ENCOUNTER — Encounter: Payer: Self-pay | Admitting: Endocrinology

## 2009-07-07 ENCOUNTER — Ambulatory Visit: Payer: Self-pay | Admitting: Cardiovascular Disease

## 2009-07-08 ENCOUNTER — Encounter: Payer: Self-pay | Admitting: Endocrinology

## 2009-07-31 ENCOUNTER — Encounter: Payer: Self-pay | Admitting: Endocrinology

## 2009-08-03 ENCOUNTER — Telehealth: Payer: Self-pay | Admitting: Endocrinology

## 2009-08-24 ENCOUNTER — Encounter: Payer: Self-pay | Admitting: Endocrinology

## 2009-09-16 ENCOUNTER — Ambulatory Visit: Payer: Self-pay | Admitting: Internal Medicine

## 2009-09-17 ENCOUNTER — Telehealth: Payer: Self-pay | Admitting: Endocrinology

## 2009-09-21 ENCOUNTER — Telehealth: Payer: Self-pay | Admitting: Endocrinology

## 2009-09-23 ENCOUNTER — Encounter: Payer: Self-pay | Admitting: Endocrinology

## 2009-09-29 ENCOUNTER — Encounter: Payer: Self-pay | Admitting: Endocrinology

## 2009-09-29 ENCOUNTER — Ambulatory Visit: Payer: Self-pay | Admitting: Endocrinology

## 2009-09-29 DIAGNOSIS — M25569 Pain in unspecified knee: Secondary | ICD-10-CM | POA: Insufficient documentation

## 2009-09-29 DIAGNOSIS — C61 Malignant neoplasm of prostate: Secondary | ICD-10-CM

## 2009-09-29 HISTORY — DX: Malignant neoplasm of prostate: C61

## 2009-09-29 LAB — CONVERTED CEMR LAB
ALT: 19 units/L (ref 0–53)
Albumin: 3.4 g/dL — ABNORMAL LOW (ref 3.5–5.2)
Basophils Relative: 0.3 % (ref 0.0–3.0)
Bilirubin Urine: NEGATIVE
Bilirubin, Direct: 0.1 mg/dL (ref 0.0–0.3)
CO2: 30 meq/L (ref 19–32)
Calcium: 9.3 mg/dL (ref 8.4–10.5)
Chloride: 104 meq/L (ref 96–112)
Cholesterol: 100 mg/dL (ref 0–200)
Creatinine,U: 217.4 mg/dL
Eosinophils Relative: 1.9 % (ref 0.0–5.0)
Folate: 4.9 ng/mL
GFR calc non Af Amer: 68.52 mL/min (ref >60)
Glucose, Bld: 174 mg/dL — ABNORMAL HIGH (ref 70–99)
HCT: 36 % — ABNORMAL LOW (ref 39.0–52.0)
HDL: 34 mg/dL — ABNORMAL LOW (ref >39.00)
Hemoglobin, Urine: NEGATIVE
Hgb A1c MFr Bld: 7.6 % — ABNORMAL HIGH (ref 4.6–6.5)
Iron: 79 ug/dL (ref 42–165)
Ketones, ur: NEGATIVE mg/dL
LDL Cholesterol: 50 mg/dL (ref 0–99)
Leukocytes, UA: NEGATIVE
Lymphs Abs: 0.8 10*3/uL (ref 0.7–4.0)
MCV: 86.9 fL (ref 78.0–100.0)
Microalb Creat Ratio: 42.3 mg/g — ABNORMAL HIGH (ref 0.0–30.0)
Microalb, Ur: 92 mg/dL — ABNORMAL HIGH (ref 0.0–1.9)
Monocytes Absolute: 0.4 10*3/uL (ref 0.1–1.0)
Monocytes Relative: 8.3 % (ref 3.0–12.0)
Neutro Abs: 3.8 10*3/uL (ref 1.4–7.7)
Neutrophils Relative %: 74.5 % (ref 43.0–77.0)
PSA: 1.46 ng/mL (ref 0.10–4.00)
Platelets: 173 10*3/uL (ref 150.0–400.0)
Potassium: 3.8 meq/L (ref 3.5–5.1)
RBC: 4.14 M/uL — ABNORMAL LOW (ref 4.22–5.81)
RDW: 14.4 % (ref 11.5–14.6)
Saturation Ratios: 30.5 % (ref 20.0–50.0)
Sodium: 141 meq/L (ref 135–145)
Specific Gravity, Urine: >=1.03 (ref 1.000–1.030)
Total CHOL/HDL Ratio: 3
Total Protein: 6.6 g/dL (ref 6.0–8.3)
Transferrin: 185.3 mg/dL — ABNORMAL LOW (ref 212.0–360.0)
Triglycerides: 81 mg/dL (ref 0.0–149.0)
Uric Acid, Serum: 4.8 mg/dL (ref 4.0–7.8)
Urine Glucose: NEGATIVE mg/dL
Urobilinogen, UA: 1 (ref 0.0–1.0)
VLDL: 16.2 mg/dL (ref 0.0–40.0)
WBC: 5.1 10*3/uL (ref 4.5–10.5)

## 2009-10-04 ENCOUNTER — Telehealth: Payer: Self-pay | Admitting: Endocrinology

## 2009-10-13 ENCOUNTER — Telehealth: Payer: Self-pay | Admitting: Endocrinology

## 2009-10-13 ENCOUNTER — Ambulatory Visit: Payer: Self-pay | Admitting: Internal Medicine

## 2009-10-13 LAB — CONVERTED CEMR LAB: POC INR: 3.3

## 2009-10-26 ENCOUNTER — Telehealth: Payer: Self-pay | Admitting: Endocrinology

## 2009-11-19 ENCOUNTER — Encounter (INDEPENDENT_AMBULATORY_CARE_PROVIDER_SITE_OTHER): Payer: Self-pay | Admitting: Pharmacist

## 2009-12-02 ENCOUNTER — Ambulatory Visit: Payer: Self-pay | Admitting: Cardiovascular Disease

## 2009-12-02 LAB — CONVERTED CEMR LAB: POC INR: 2.5

## 2009-12-24 ENCOUNTER — Ambulatory Visit: Payer: Self-pay

## 2009-12-27 ENCOUNTER — Ambulatory Visit: Payer: Self-pay

## 2010-01-25 ENCOUNTER — Telehealth: Payer: Self-pay | Admitting: Endocrinology

## 2010-02-06 ENCOUNTER — Encounter: Payer: Self-pay | Admitting: Orthopedic Surgery

## 2010-02-07 ENCOUNTER — Encounter (INDEPENDENT_AMBULATORY_CARE_PROVIDER_SITE_OTHER): Payer: Self-pay | Admitting: Pharmacist

## 2010-02-14 ENCOUNTER — Ambulatory Visit: Admission: RE | Admit: 2010-02-14 | Discharge: 2010-02-14 | Payer: Self-pay | Source: Home / Self Care

## 2010-02-14 LAB — CONVERTED CEMR LAB: POC INR: 1.2

## 2010-02-15 NOTE — Medication Information (Signed)
Summary: cvrr  Anticoagulant Therapy  Managed by: Cloyde Reams, RN, BSN Referring MD: Sandrea Hughs PCP: Minus Breeding MD Supervising MD: Jens Som MD, Arlys John Indication 1: Pulmonary embolus (ICD-415.19) [place] Site: [facility] INR POC 2.2 INR RANGE 2 - 3  Dietary changes: no    Health status changes: no    Bleeding/hemorrhagic complications: no    Recent/future hospitalizations: no    Any changes in medication regimen? no    Recent/future dental: no  Any missed doses?: no       Is patient compliant with meds? yes      Comments: Pt having procedure on 2/22, will be holding coumadin 5 days prior to procedure.    Allergies (verified): No Known Drug Allergies  Anticoagulation Management History:      The patient is taking warfarin and comes in today for a routine follow up visit.  Positive risk factors for bleeding include presence of serious comorbidities.  Negative risk factors for bleeding include an age less than 77 years old.  The bleeding index is 'intermediate risk'.  Positive CHADS2 values include History of HTN and History of Diabetes.  Negative CHADS2 values include Age > 48 years old.  The start date was 01/16/2002.  Anticoagulation responsible provider: Jens Som MD, Arlys John.  INR POC: 2.2.  Cuvette Lot#: [id number].  Exp: 04/2009.    Anticoagulation Management Assessment/Plan:      The target INR is 2 - 3.  The next INR is due 01/05/2009.  Results were reviewed/authorized by Cloyde Reams, RN, BSN.  He was notified by Cloyde Reams RN.         Prior Anticoagulation Instructions: INR: 1.4  Resume Coumadin per Dr. instructions.  Recheck in 1 week.  Current Anticoagulation Instructions: INR 2.2 Continue on same dosage 10mg  daily except 5mg  on Mondays, Wednesdays, and Fridays.  Last dose of coumadin is on 03/03/09.  Recheck on 03/08/09.

## 2010-02-15 NOTE — Letter (Signed)
Summary: [facility]  [facility]   Imported By: Sherian Rein 08/09/2009 15:08:28  _____________________________________________________________________  External Attachment:    Type:   Image     Comment:   External Document

## 2010-02-15 NOTE — Letter (Signed)
Summary: Primary Care Appointment Letter  [facility]  [address] [place], [place] [address]   Phone: [phone]  Fax: [phone]    09/23/2009 MRN: [id number]  Travis Chapman [address] [place], [place]  [address]  Dear [name],   Your Primary Care Physician Minus Breeding MD has indicated that:    ___X____it is time to schedule an appointment with Dr. Everardo All for your physical.  Please call the office.    _______you missed your appointment on______ and need to call and          reschedule.    _______you need to have lab work done.    _______you need to schedule an appointment discuss lab or test results.    _______you need to call to reschedule your appointment that is                       scheduled on _________.     Please call our office as soon as possible. Our phone number is [phone]. Please press option 1. Our office is open 8a-12noon and 1p-5p, Monday through Friday.     Thank you,    [facility]

## 2010-02-15 NOTE — Progress Notes (Signed)
Summary: clonidine  Phone Note Refill Request Message from:  Fax from Pharmacy on October 13, 2009 11:31 AM  Refills Requested: Medication #1:  CLONIDINE HCL 0.2 MG TABS 1 tid   Dosage confirmed as above?Dosage Confirmed   Last Refilled: 09/11/2009  Method Requested: Electronic Initial call taken by: Brenton Grills MA,  October 13, 2009 11:32 AM    Prescriptions: CLONIDINE HCL 0.2 MG TABS (CLONIDINE HCL) 1 tid  #100 Each x 2   Entered by:   Brenton Grills MA   Authorized by:   Minus Breeding MD   Signed by:   Brenton Grills MA on 10/13/2009   Method used:   Electronically to        [facility] (retail)       [address]       [place], [place]  [address]       Ph: [phone]       Fax: [phone]   RxID:   [id number]

## 2010-02-15 NOTE — Consult Note (Signed)
Summary: [facility]  [facility]   Imported By: Lester Crellin 02/09/2009 17:02:46  _____________________________________________________________________  External Attachment:    Type:   Image     Comment:   External Document

## 2010-02-15 NOTE — Progress Notes (Signed)
Summary: refill  Phone Note Refill Request Message from:  Fax from Pharmacy on May 18, 2009 11:28 AM  Refills Requested: Medication #1:  ALPRAZOLAM 2 MG TABS 1 at bedtime  Medication #2:  CELEXA 40 MG  TABS TAKE 1 by mouth QD Next Appointment Scheduled: none Initial call taken by: Lucious Groves,  May 18, 2009 11:28 AM  Follow-up for Phone Call        i printed Follow-up by: Minus Breeding MD,  May 18, 2009 12:57 PM  Additional Follow-up for Phone Call Additional follow up Details #1::        Xanax faxed to pharmacy Additional Follow-up by: Margaret Pyle, CMA,  May 18, 2009 1:22 PM    Prescriptions: CELEXA 40 MG  TABS (CITALOPRAM HYDROBROMIDE) TAKE 1 by mouth QD  #30 Each x 10   Entered and Authorized by:   Minus Breeding MD   Signed by:   Minus Breeding MD on 05/18/2009   Method used:   Electronically to        [facility] (retail)       [address]       [place], [place]  [address]       Ph: [phone]       Fax: [phone]   RxID:   [id number] ALPRAZOLAM 2 MG TABS (ALPRAZOLAM) 1 at bedtime  #30 x 5   Entered and Authorized by:   Minus Breeding MD   Signed by:   Minus Breeding MD on 05/18/2009   Method used:   Print then Give to Patient   RxID:   [id number]

## 2010-02-15 NOTE — Letter (Signed)
[facility]   Imported By: Sherian Rein 02/17/2009 10:08:13  _____________________________________________________________________  External Attachment:    Type:   Image     Comment:   External Document

## 2010-02-15 NOTE — Assessment & Plan Note (Signed)
Summary: MED REFILL/#/CD   Vital Signs:  Patient profile:   65 year old male Height:      76 inches (193.04 cm) Weight:      378 pounds (171.82 kg) O2 Sat:      95 % on Room air Temp:     97.4 degrees F (36.33 degrees C) oral Pulse rate:   78 / minute BP sitting:   140 / 98  (left arm) Cuff size:   large  Vitals Entered By: Josph Macho CMA (February 19, 2009 2:09 PM)  O2 Flow:  Room air CC: Med refill/ pt needs Triazolam refilled/ All of the other meds have 1-2 refills availble EXCEPT Hydrochlorothiazide/ CF   Primary Provider:  Minus Breeding MD  CC:  Med refill/ pt needs Triazolam refilled/ All of the other meds have 1-2 refills availble EXCEPT Hydrochlorothiazide/ CF.  History of Present Illness: pt takes bp meds as rx'ed.  bp was normal at cancer center, last week he says with triazolam x2, he gets to sleep, but awakens early.  Current Medications (verified): 1)  Triazolam 0.25 Mg  Tabs (Triazolam) .... Take 2 At Bedtime Qd 2)  Hydrochlorothiazide 25 Mg  Tabs (Hydrochlorothiazide) .... Take 1 By Mouth Qd 3)  Furosemide 20 Mg  Tabs (Furosemide) .... Take 1 By Mouth Qd 4)  Celexa 40 Mg  Tabs (Citalopram Hydrobromide) .... Take 1 By Mouth Qd 5)  Norvasc 10 Mg  Tabs (Amlodipine Besylate) .... Take 1 By Mouth Qd 6)  Simvastatin 80 Mg  Tabs (Simvastatin) .... Take 1 By Mouth Qd 7)  Multivitamins   Tabs (Multiple Vitamin) .... Take 1 Qd 8)  Relion Insulin Syringe 30g X 5/16" 0.5 Ml  Misc (Insulin Syringe-Needle U-100) .... [place] As Directed 9)  Truetrack Test   Strp (Glucose Blood) .... Use 1 Strip Three Times A Day Once Daily 10)  Vicodin 5-500 Mg  Tabs (Hydrocodone-Acetaminophen) .... One Every 4 Hours As Needed For Pain 11)  Klor-Con M20 20 Meq Cr-Tabs (Potassium Chloride Crys Cr) .... Take 2 By Mouth Once Daily 12)  Lantus 100 Unit/ml Soln (Insulin Glargine) .... 45 Units Qam 13)  Benicar 20 Mg Tabs (Olmesartan Medoxomil) .Marland Kitchen.. 1 Qd 14)  Reglan 5 Mg Tabs (Metoclopramide  Hcl) .... Take 1 Three Times A Day 15)  Clonidine Hcl 0.2 Mg Tabs (Clonidine Hcl) .Marland Kitchen.. 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 1-2 at bedtime as needed sleep 2)  Benicar 40 Mg Tabs (Olmesartan medoxomil) .Marland Kitchen.. 1 qd  Other Orders: Est. Patient Level III ([address])  Patient Instructions: 1)  change triazolam to temazepam 30 mg, 1-2 at bedtime as needed sleep. 2)  increase benicar to 40 mg once daily 3)  return 1 month Prescriptions: SIMVASTATIN 80 MG  TABS (SIMVASTATIN) TAKE 1 by mouth QD  #30 Each x 11  Entered and Authorized by:   Minus Breeding MD   Signed by:   Minus Breeding MD on 02/19/2009   Method used:   Electronically to        [facility] (retail)       [address]       [place], [place]  [address]       Ph: [phone]       Fax: [phone]   RxID:   [id number] TEMAZEPAM 30 MG CAPS (TEMAZEPAM) 1-2 at bedtime as needed sleep  #60 x 0   Entered and Authorized by:   Minus Breeding MD   Signed by:   Minus Breeding MD on 02/19/2009   Method used:   Print then Give to Patient   RxID:   [id number]

## 2010-02-15 NOTE — Medication Information (Signed)
Summary: rov/ewj  Anticoagulant Therapy  Managed by: Bethena Midget, RN, BSN Referring MD: Sandrea Hughs PCP: Minus Breeding MD Supervising MD: Gala Romney MD, Reuel Boom Indication 1: Pulmonary embolus (ICD-415.19) [place] Site: [facility] INR POC 1.2 INR RANGE 2 - 3  Dietary changes: no    Health status changes: no    Bleeding/hemorrhagic complications: no    Recent/future hospitalizations: no    Any changes in medication regimen? no    Recent/future dental: no  Any missed doses?: yes     Details: Been off for 5 days for procedure tomorrow  Is patient compliant with meds? yes      Comments: Having Seed Implantation on tomorrow for Prostate CA  Allergies: No Known Drug Allergies  Anticoagulation Management History:      The patient is taking warfarin and comes in today for a routine follow up visit.  Positive risk factors for bleeding include presence of serious comorbidities.  Negative risk factors for bleeding include an age less than 28 years old.  The bleeding index is 'intermediate risk'.  Positive CHADS2 values include History of HTN and History of Diabetes.  Negative CHADS2 values include Age > 69 years old.  The start date was 01/16/2002.  Anticoagulation responsible provider: Ashtynn Berke MD, Reuel Boom.  INR POC: 1.2.  Cuvette Lot#: [id number].  Exp: 04/2009.    Anticoagulation Management Assessment/Plan:      The target INR is 2 - 3.  The next INR is due 03/17/2009.  Anticoagulation instructions were given to patient.  Results were reviewed/authorized by Bethena Midget, RN, BSN.  He was notified by Bethena Midget, RN, BSN.         Prior Anticoagulation Instructions: INR 2.2 Continue on same dosage 10mg  daily except 5mg  on Mondays, Wednesdays, and Fridays.  Last dose of coumadin is on 03/03/09.  Recheck on 03/08/09.  Current Anticoagulation Instructions: INR 1.2 Resume coumadin per MD instructions. Recheck on 03/17/09.

## 2010-02-15 NOTE — Medication Information (Signed)
Summary: ccr  Anticoagulant Therapy  Managed by: Bethena Midget, RN, BSN Referring MD: Sandrea Hughs PCP: Minus Breeding MD Supervising MD: Johney Frame MD, Fayrene Fearing Indication 1: Pulmonary embolus (ICD-415.19) Lab Used: [facility] [place] Site: [address] INR POC 1.5 INR RANGE 2 - 3  Dietary changes: no    Health status changes: no    Bleeding/hemorrhagic complications: no    Recent/future hospitalizations: no    Any changes in medication regimen? no    Recent/future dental: no  Any missed doses?: yes     Details: Been out of coumadin for 5 days.   Is patient compliant with meds? yes      Comments: Taking Radiation for Prostate CA. Goes 5 days aweek for 8weeks. Has 3 weeks left to go.   Allergies: No Known Drug Allergies  Anticoagulation Management History:      The patient is taking warfarin and comes in today for a routine follow up visit.  Positive risk factors for bleeding include presence of serious comorbidities.  Negative risk factors for bleeding include an age less than 78 years old.  The bleeding index is 'intermediate risk'.  Positive CHADS2 values include History of HTN and History of Diabetes.  Negative CHADS2 values include Age > 93 years old.  The start date was 01/16/2002.  Anticoagulation responsible provider: Yeraldine Forney MD, Fayrene Fearing.  INR POC: 1.5.  Cuvette Lot#: [id number].  Exp: 05/2010.    Anticoagulation Management Assessment/Plan:      The patient's current anticoagulation dose is Warfarin sodium 10 mg tabs: Use as directed by Anticoagulation Clinic.  The target INR is 2 - 3.  The next INR is due 05/12/2009.  Anticoagulation instructions were given to patient.  Results were reviewed/authorized by Bethena Midget, RN, BSN.  He was notified by Bethena Midget, RN, BSN.         Prior Anticoagulation Instructions: INR 1.2 Resume coumadin per MD instructions. Recheck on 03/17/09.   Current Anticoagulation Instructions: INR 1.5 Today take 10mg s then resume 10mg s  everyday except 5mg s on Mondays, Wednesdays and Fridays. Recheck in 2 weeks.   Prescriptions: WARFARIN SODIUM 10 MG TABS (WARFARIN SODIUM) Use as directed by Anticoagulation Clinic  #30 x 1   Entered by:   Bethena Midget, RN, BSN   Authorized by:   Hillis Range, MD   Signed by:   Bethena Midget, RN, BSN on 04/28/2009   Method used:   Electronically to        [facility] (retail)       [address]       [place], [place]  [address]       Ph: [phone]       Fax: [phone]   RxID:   [id number]

## 2010-02-15 NOTE — Progress Notes (Signed)
Summary: Coumadin refill request  Phone Note Call from Patient   Caller: Patient Call For: Coumadin Clinic/Dr Everardo All Summary of Call: Pt called out of coumadin, last dose yesterday, needs refill.  Advised pt last PT/INR check was 03/08/09 and he was 1.2 off coumadin for seed implant.  Pt states he resumed after procedure and has not had coumadin checked since.  Pt refused to make f/u appt in CVRR clinic states he is unable to afford $35 per month.  Advised it is not safe to take coumadin without having it monitored at least once a month.  Pt refused to make appt, Dr George Hugh office denied rx refill will forward message to them to address.   Initial call taken by: Cloyde Reams RN,  April 26, 2009 2:40 PM  Follow-up for Phone Call        Pt spoke with Ferdie Ping this morning and advised pt to contact Coumadin Clinic.  Per phone note on 11/05/08 Coumadin Clinic would not be giving pt anymore Coumadin? Please advise? Follow-up by: Josph Macho RMA,  April 26, 2009 3:31 PM  Additional Follow-up for Phone Call Additional follow up Details #1::        i called pt 04/26/09--his # does not accept "blocked" calls.   please call patient: it is not safe to take coumadin unless he goes to coumadin clinic.  please advise him that refill is declined until he resumes f/u in coumadin clinic. Additional Follow-up by: Minus Breeding MD,  April 26, 2009 7:42 PM    Additional Follow-up for Phone Call Additional follow up Details #2::    pt advised and states that he will call Coumadin clinic and check for appt today Follow-up by: Margaret Pyle, CMA,  April 27, 2009 8:52 AM

## 2010-02-15 NOTE — Miscellaneous (Signed)
Summary: Substances contract  Substances contract   Imported By: Lester  10/01/2009 12:52:43  _____________________________________________________________________  External Attachment:    Type:   Image     Comment:   External Document

## 2010-02-15 NOTE — Progress Notes (Signed)
Summary: Call Report  Phone Note Other Incoming   Caller: [facility] Summary of Call: [facility] Triage Call Report Triage Record Num: [id number] Operator: Karenann Cai Patient Name: Travis Chapman Call Date & Time: 09/19/2009 3:03:06PM Patient Phone: [phone] PCP: Romero Belling Patient Gender: Male PCP Fax : [phone] Patient DOB: 02-11-1945 Practice Name: [facility] Reason for Call: Katrice/Family regarding insulin prescription for patient. Patient's pharmacy is [facility] at [phone]. Prescription needed is [facility] insulin: RN contacted Pharmacist. Patient's prescription had been filled and is ready for pick up. This information shared with Katrice. Protocol(s) Used: Office Note Recommended Outcome per Protocol: Information Noted and Sent to Office Reason for Outcome: Caller information to office Care Advice:  ~ Initial call taken by: Margaret Pyle, CMA,  September 21, 2009 8:13 AM  Follow-up for Phone Call        please call patient: cpx is due Follow-up by: Minus Breeding MD,  September 21, 2009 8:56 AM  Additional Follow-up for Phone Call Additional follow up Details #1::        Pt informed via VM Additional Follow-up by: Margaret Pyle, CMA,  September 21, 2009 9:20 AM

## 2010-02-15 NOTE — Assessment & Plan Note (Signed)
Summary: FU--MED REFILL STC   Vital Signs:  Patient profile:   65 year old male Height:      76 inches (193.04 cm) Weight:      386 pounds (175.45 kg) O2 Sat:      87 % on Room air Temp:     98.4 degrees F (36.89 degrees C) oral Pulse rate:   100 / minute BP sitting:   152 / 90  (left arm) Cuff size:   large  Vitals Entered By: Josph Macho RMA (April 21, 2009 11:13 AM)  O2 Flow:  Room air CC: Follow-up visit/ pt needs a refill on Vicodin/ pt would like to change the sleeping medication/ CF Is Patient Diabetic? Yes   Primary Provider:  Minus Breeding MD  CC:  Follow-up visit/ pt needs a refill on Vicodin/ pt would like to change the sleeping medication/ CF.  History of Present Illness: the status of at least 3 ongoing medical problems is addressed today: pt says he forgot his bp meds this am, but tolerates them well.   pt says vicodin helps his chronic low-back pain.  he says he requires more since his prostate radiation. pt states temazepam gets him 3-4 hrs of sleep, then he awakens. no cbg record, but states cbg's are well-controlled.  he refuses a1c today.  Current Medications (verified): 1)  Hydrochlorothiazide 25 Mg  Tabs (Hydrochlorothiazide) .... Take 1 By Mouth Qd 2)  Furosemide 20 Mg  Tabs (Furosemide) .... Take 1 By Mouth Qd 3)  Celexa 40 Mg  Tabs (Citalopram Hydrobromide) .... Take 1 By Mouth Qd 4)  Norvasc 10 Mg  Tabs (Amlodipine Besylate) .... Take 1 By Mouth Qd 5)  Simvastatin 80 Mg  Tabs (Simvastatin) .... Take 1 By Mouth Qd 6)  Multivitamins   Tabs (Multiple Vitamin) .... Take 1 Qd 7)  Relion Insulin Syringe 30g X 5/16" 0.5 Ml  Misc (Insulin Syringe-Needle U-100) .... [place] As Directed 8)  Truetrack Test   Strp (Glucose Blood) .... Use 1 Strip Three Times A Day Once Daily 9)  Vicodin 5-500 Mg  Tabs (Hydrocodone-Acetaminophen) .... One Every 4 Hours As Needed For Pain 10)  Klor-Con M20 20 Meq Cr-Tabs (Potassium Chloride Crys Cr) .... Take 2 By Mouth Once  Daily 11)  Lantus 100 Unit/ml Soln (Insulin Glargine) .... 45 Units Qam 12)  Reglan 5 Mg Tabs (Metoclopramide Hcl) .... Take 1 Three Times A Day 13)  Clonidine Hcl 0.2 Mg Tabs (Clonidine Hcl) .Marland Kitchen.. 1 Tid 14)  Temazepam 30 Mg Caps (Temazepam) .Marland Kitchen.. 1-2 At Bedtime As Needed Sleep 15)  Benicar 40 Mg Tabs (Olmesartan Medoxomil) .Marland Kitchen.. 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  The patient denies depression.    Physical Exam  General:  morbidly obese.  no distress Msk:  back is nontender.  there is a healed midline lumbar surgical scar. Neurologic:  sensation is intact to touch on the feet    Impression & Recommendations:  Problem # 1:  HYPERTENSION (ICD-401.9) therapy limited by noncompliance.  i'll do the best i can.  Problem # 2:  ANXIETY (ICD-300.00) and  insomnia, needs increased rx.  Problem # 3:  OSTEOARTHRITIS (ICD-715.90) pain is well-controlled  Problem # 4:  DM (ICD-250.00)  Medications Added to Medication List This Visit: 1)  Alprazolam 2 Mg Tabs (Alprazolam) .Marland Kitchen.. 1 at bedtime  Other Orders: Est. Patient Level IV ([address])  Patient Instructions: 1)  change temazepam to alprazolam 2 mg at bedtime.  here is a 21-month supply on a trial basis.  call us before it runs out, and let us know how it works. 2)  here is also a refill of your vicodin.  please continue the same. 3)  please come in for a physical in 4 months. Prescriptions: VICODIN 5-500 MG  TABS (HYDROCODONE-ACETAMINOPHEN) one every 4 hours as needed for pain  #50 x 5   Entered and Authorized  by:   Minus Breeding MD   Signed by:   Minus Breeding MD on 04/21/2009   Method used:   Print then Give to Patient   RxID:   [id number] ALPRAZOLAM 2 MG TABS (ALPRAZOLAM) 1 at bedtime  #30 x 0   Entered and Authorized by:   Minus Breeding MD   Signed by:   Minus Breeding MD on 04/21/2009   Method used:   Print then Give to Patient   RxID:   [id number]

## 2010-02-15 NOTE — Progress Notes (Signed)
Summary: Rx refill req  Phone Note Refill Request Message from:  Patient on August 03, 2009 2:37 PM  Refills Requested: Medication #1:  BENICAR 40 MG TABS 1 qd   Dosage confirmed as above?Dosage Confirmed   Supply Requested: 6 months  Method Requested: Electronic Initial call taken by: Margaret Pyle, CMA,  August 03, 2009 2:37 PM    Prescriptions: BENICAR 40 MG TABS (OLMESARTAN MEDOXOMIL) 1 qd  #90 x 1   Entered by:   Margaret Pyle, CMA   Authorized by:   Minus Breeding MD   Signed by:   Margaret Pyle, CMA on 08/03/2009   Method used:   Electronically to        [facility] (retail)       [address]       [place], [place]  [address]       Ph: [phone]       Fax: [phone]   RxID:   [id number]

## 2010-02-15 NOTE — Progress Notes (Signed)
  Phone Note Refill Request Message from:  Fax from Pharmacy on September 17, 2009 4:12 PM  Refills Requested: Medication #1:  LANTUS 100 UNIT/ML SOLN 45 units qam   Dosage confirmed as above?Dosage Confirmed  Method Requested: Fax to Local Pharmacy Initial call taken by: Brenton Grills MA,  September 17, 2009 4:13 PM    Prescriptions: LANTUS 100 UNIT/ML SOLN (INSULIN GLARGINE) 45 units qam  #20 [place] x 0   Entered by:   Brenton Grills MA   Authorized by:   Minus Breeding MD   Signed by:   Brenton Grills MA on 09/17/2009   Method used:   F[facility]ed to ...       [facility]       [address]       [place], [place]  [address]       Ph: [phone]       Fax: [phone]   RxID:   [id number]

## 2010-02-15 NOTE — Letter (Signed)
[facility]   Imported By: Sherian Rein 03/04/2009 11:34:14  _____________________________________________________________________  External Attachment:    Type:   Image     Comment:   External Document

## 2010-02-15 NOTE — Letter (Signed)
[facility]   Imported By: Sherian Rein 07/28/2009 13:19:19  _____________________________________________________________________  External Attachment:    Type:   Image     Comment:   External Document

## 2010-02-15 NOTE — Progress Notes (Signed)
Summary: benicar  Phone Note Refill Request Message from:  Fax from Pharmacy on October 26, 2009 10:13 AM  Refills Requested: Medication #1:  BENICAR 40 MG TABS 1 qd   Dosage confirmed as above?Dosage Confirmed  Method Requested: Electronic Initial call taken by: Brenton Grills MA,  October 26, 2009 10:13 AM    Prescriptions: BENICAR 40 MG TABS (OLMESARTAN MEDOXOMIL) 1 qd  #90 x 2   Entered by:   Brenton Grills MA   Authorized by:   Minus Breeding MD   Signed by:   Brenton Grills MA on 10/26/2009   Method used:   Electronically to        [facility] (retail)       [address]       [place], [place]  [address]       Ph: [phone]       Fax: [phone]   RxID:   [id number]

## 2010-02-15 NOTE — Medication Information (Signed)
Summary: COUMADIN CK/MT  Anticoagulant Therapy  Managed by: Bethena Midget, RN, BSN Referring MD: Sandrea Hughs PCP: Minus Breeding MD Supervising MD: Graciela Husbands MD, Viviann Spare Indication 1: Pulmonary embolus (ICD-415.19) Lab Used: [facility] [place] Site: [address] INR POC 1.2 INR RANGE 2 - 3  Dietary changes: no    Health status changes: no    Bleeding/hemorrhagic complications: no    Recent/future hospitalizations: no    Any changes in medication regimen? no    Recent/future dental: no  Any missed doses?: yes     Details: Been off for a week. Ran out  Is patient compliant with meds? yes       Allergies: No Known Drug Allergies  Anticoagulation Management History:      The patient is taking warfarin and comes in today for a routine follow up visit.  Positive risk factors for bleeding include presence of serious comorbidities.  Negative risk factors for bleeding include an age less than 46 years old.  The bleeding index is 'intermediate risk'.  Positive CHADS2 values include History of HTN and History of Diabetes.  Negative CHADS2 values include Age > 61 years old.  The start date was 01/16/2002.  Anticoagulation responsible provider: Graciela Husbands MD, Viviann Spare.  INR POC: 1.2.  Cuvette Lot#: [id number].  Exp: 10/2010.    Anticoagulation Management Assessment/Plan:      The patient's current anticoagulation dose is Warfarin sodium 10 mg tabs: Use as directed by Anticoagulation Clinic.  The target INR is 2 - 3.  The next INR is due 09/27/2009.  Anticoagulation instructions were given to patient.  Results were reviewed/authorized by Bethena Midget, RN, BSN.  He was notified by Bethena Midget, RN, BSN.         Prior Anticoagulation Instructions: INR 2.9 Continue 10mg s everyday except 5mg s on Mondays, Wednesdays and Fridays. Recheck in 4 weeks.   Current Anticoagulation Instructions: INR 1.2 Today and Friday take 10mg s. Then resume regular dose of 10mg s everyday except 5mg s on Mondays,  Wednesdays and Fridays. Recheck in 10 days.  Prescriptions: WARFARIN SODIUM 10 MG TABS (WARFARIN SODIUM) Use as directed by Anticoagulation Clinic  #30 Each x 0   Entered by:   Bethena Midget, RN, BSN   Authorized by:   Nathen May, MD, [facility]   Signed by:   Bethena Midget, RN, BSN on 09/16/2009   Method used:   Electronically to        [facility] (retail)       [address]       [place], [place]  [address]       Ph: [phone]       Fax: [phone]   RxID:   [id number]

## 2010-02-15 NOTE — Progress Notes (Signed)
Summary: Lab results  Phone Note Call from Patient Call back at Home Phone [phone]   Caller: Patient Summary of Call: Pt called requesting results of labs. Pt was advised that labs were not review but it will be requested from MD when he returns to office tomorrow. Initial call taken by: Margaret Pyle, CMA,  October 04, 2009 12:02 PM  Follow-up for Phone Call        phone-tree message was left last week.  i checked today, and it is still there. Follow-up by: Minus Breeding MD,  October 04, 2009 5:31 PM  Additional Follow-up for Phone Call Additional follow up Details #1::        no answer/no VM. Margaret Pyle, CMA  October 05, 2009 9:17 AM  Pt's mother advise and will inform Pt to check PT for results Additional Follow-up by: Margaret Pyle, CMA,  October 05, 2009 11:47 AM

## 2010-02-15 NOTE — Letter (Signed)
[facility]   Imported By: Sherian Rein 06/09/2009 13:50:49  _____________________________________________________________________  External Attachment:    Type:   Image     Comment:   External Document

## 2010-02-15 NOTE — Medication Information (Signed)
Summary: rov/tm  Anticoagulant Therapy  Managed by: Bethena Midget, RN, BSN Referring MD: Sandrea Hughs PCP: Minus Breeding MD Supervising MD: Eden Emms MD, Theron Arista Indication 1: Pulmonary embolus (ICD-415.19) Lab Used: [facility] [place] Site: [address] INR POC 2.9 INR RANGE 2 - 3  Dietary changes: yes       Details: Eating less green veggies  Health status changes: no    Bleeding/hemorrhagic complications: no    Recent/future hospitalizations: no    Any changes in medication regimen? no    Recent/future dental: no  Any missed doses?: no       Is patient compliant with meds? yes       Allergies: No Known Drug Allergies  Anticoagulation Management History:      The patient is taking warfarin and comes in today for a routine follow up visit.  Positive risk factors for bleeding include presence of serious comorbidities.  Negative risk factors for bleeding include an age less than 83 years old.  The bleeding index is 'intermediate risk'.  Positive CHADS2 values include History of HTN and History of Diabetes.  Negative CHADS2 values include Age > 66 years old.  The start date was 01/16/2002.  Anticoagulation responsible provider: Eden Emms MD, Theron Arista.  INR POC: 2.9.  Cuvette Lot#: [id number].  Exp: 09/2010.    Anticoagulation Management Assessment/Plan:      The patient's current anticoagulation dose is Warfarin sodium 10 mg tabs: Use as directed by Anticoagulation Clinic.  The target INR is 2 - 3.  The next INR is due 08/04/2009.  Anticoagulation instructions were given to patient.  Results were reviewed/authorized by Bethena Midget, RN, BSN.  He was notified by Bethena Midget, RN, BSN.         Prior Anticoagulation Instructions: INR 1.5 Today take 10mg s then resume 10mg s everyday except 5mg s on Mondays, Wednesdays and Fridays. Recheck in 2 weeks.    Current Anticoagulation Instructions: INR 2.9 Continue 10mg s everyday except 5mg s on Mondays, Wednesdays and Fridays. Recheck  in 4 weeks.

## 2010-02-15 NOTE — Progress Notes (Signed)
  Phone Note Other Incoming   Caller: Dr Nadine Counts [phone] Summary of Call: Dr Larene Beach called regarding this pt. He would like to speak with you when you get a chance Initial call taken by: Ami Bullins CMA,  February 08, 2009 3:18 PM  Follow-up for Phone Call        i called dr Dayton Scrape 02/08/09.  we should plan and hope for > 5 years survival Follow-up by: Minus Breeding MD,  February 10, 2009 8:49 AM

## 2010-02-15 NOTE — Medication Information (Signed)
Summary: ccr   Anticoagulant Therapy  Managed by: Lyna Poser, PharmD Referring MD: Sandrea Hughs PCP: Minus Breeding MD Supervising MD: Clifton James MD, Cristal Deer Indication 1: Pulmonary embolus (ICD-415.19) Lab Used: [facility] [place] Site: [address] INR POC 2.5 INR RANGE 2 - 3  Dietary changes: no    Health status changes: no    Bleeding/hemorrhagic complications: no    Recent/future hospitalizations: no    Any changes in medication regimen? no    Recent/future dental: no  Any missed doses?: no       Is patient compliant with meds? yes       Allergies: No Known Drug Allergies  Anticoagulation Management History:      The patient is taking warfarin and comes in today for a routine follow up visit.  Positive risk factors for bleeding include presence of serious comorbidities.  Negative risk factors for bleeding include an age less than 40 years old.  The bleeding index is 'intermediate risk'.  Positive CHADS2 values include History of HTN and History of Diabetes.  Negative CHADS2 values include Age > 62 years old.  The start date was 01/16/2002.  Anticoagulation responsible provider: Clifton James MD, Cristal Deer.  INR POC: 2.5.  Cuvette Lot#: [id number].  Exp: 11/2010.    Anticoagulation Management Assessment/Plan:      The patient's current anticoagulation dose is Warfarin sodium 10 mg tabs: Use as directed by Anticoagulation Clinic.  The target INR is 2 - 3.  The next INR is due 12/24/2009.  Anticoagulation instructions were given to patient.  Results were reviewed/authorized by Lyna Poser, PharmD.         Prior Anticoagulation Instructions: INR 3.3  Skip today's dose of Coumadin then resume your same schedule of 10mg  on Sunday, Tuesday, Thursday and Saturday, 5mg on Monday and Thursday and no Coumadin on Wednesday.  Recheck INR in 3 weeks.   Current Anticoagulation Instructions: INR 2.5 Continue taking 1 tablet on sunday, tuesday, thursday, and  saturday. And a half tablet on monday and friday. And no coumadin on wednesday. Recheck in 3 weeks.  Prescriptions: WARFARIN SODIUM 10 MG TABS (WARFARIN SODIUM) Use as directed by Anticoagulation Clinic  #30 Each x 0   Entered by:   Monica Wilson PharmD   Authorized by:   Steven Cochran Klein, MD, FACC   Signed by:   Monica Wilson PharmD on 12/02/2009   Method used:   Electronically to        [facility] (retail)       [address]       [place], [place]  [address]       Ph: [phone]       Fax: [phone]   RxID:   [id number]

## 2010-02-15 NOTE — Letter (Signed)
Summary: [facility]  [facility]  [address]   [place], [place] [address]   Phone: [phone]  Fax: [phone]     November 19, 2009 MRN: [id number]   Travis Chapman [address] [place], [place]  [address]   Dear Mr. Plate,  This letter is being sent to you as a reminder that it is necessary for you to get your INR/PT checked regularly so that we can optimize your care.  Our records indicate that you were scheduled to have a test done recently.  As of today, we have not received the results of this test.  It is very important that you have your INR checked.  Please call our office at the number listed above to schedule an appointment at your earliest convenience.    If you have recently had your protime checked or have discontinued this medication, please contact our office at the above phone number to clarify this issue.  Thank you for this prompt attention to this important health care matter.  Sincerely,   [facility] Team

## 2010-02-15 NOTE — Letter (Signed)
Summary: [facility]  [facility]   Imported By: Sherian Rein 03/16/2009 10:53:28  _____________________________________________________________________  External Attachment:    Type:   Image     Comment:   External Document

## 2010-02-15 NOTE — Assessment & Plan Note (Signed)
Summary: PER PT PER LETTER SCHED PHY  STC   Vital Signs:  Patient profile:   65 year old male Height:      76 inches (193.04 cm) Weight:      383 pounds (174.09 kg) BMI:     46.79 O2 Sat:      94 % on Room air Temp:     98.5 degrees F (36.94 degrees C) oral Pulse rate:   81 / minute BP sitting:   142 / 82  (left arm) Cuff size:   large  Vitals Entered By: Brenton Grills MA (September 29, 2009 10:06 AM)  O2 Flow:  Room air CC: physical/aj Is Patient Diabetic? Yes   Primary Provider:  Minus Breeding MD  CC:  physical/aj.  History of Present Illness: here for regular wellness examination.  He's feeling pretty well in general, and does not drink or smoke.   Current Medications (verified): 1)  Hydrochlorothiazide 25 Mg  Tabs (Hydrochlorothiazide) .... Take 1 By Mouth Qd 2)  Furosemide 20 Mg  Tabs (Furosemide) .... Take 1 By Mouth Qd 3)  Celexa 40 Mg  Tabs (Citalopram Hydrobromide) .... Take 1 By Mouth Qd 4)  Norvasc 10 Mg  Tabs (Amlodipine Besylate) .... Take 1 By Mouth Qd 5)  Simvastatin 80 Mg  Tabs (Simvastatin) .... Take 1 By Mouth Qd 6)  Multivitamins   Tabs (Multiple Vitamin) .... Take 1 Qd 7)  Relion Insulin Syringe 30g X 5/16" 0.5 Ml  Misc (Insulin Syringe-Needle U-100) .... [place] As Directed 8)  Truetrack Test   Strp (Glucose Blood) .... Use 1 Strip Three Times A Day Once Daily 9)  Vicodin 5-500 Mg  Tabs (Hydrocodone-Acetaminophen) .... One Every 4 Hours As Needed For Pain 10)  Klor-Con M20 20 Meq Cr-Tabs (Potassium Chloride Crys Cr) .... Take 2 By Mouth Once Daily 11)  Lantus 100 Unit/ml Soln (Insulin Glargine) .... 45 Units Qam 12)  Reglan 5 Mg Tabs (Metoclopramide Hcl) .... Take 1 Three Times A Day 13)  Clonidine Hcl 0.2 Mg Tabs (Clonidine Hcl) .Marland Kitchen.. 1 Tid 14)  Benicar 40 Mg Tabs (Olmesartan Medoxomil) .Marland Kitchen.. 1 Qd 15)  Alprazolam 2 Mg Tabs (Alprazolam) .Marland Kitchen.. 1 At Bedtime 16)  Warfarin Sodium 10 Mg Tabs (Warfarin Sodium) .... Use As Directed By Anticoagulation  Clinic  Allergies (verified): No Known Drug Allergies  Family History: Reviewed history from 05/23/2007 and no changes required. no cancer  Social History: Reviewed history from 05/23/2007 and no changes required. disabled single lives with his elderly mother  Review of Systems  The patient denies fever, weight gain, vision loss, decreased hearing, chest pain, syncope, prolonged cough, headaches, abdominal pain, melena, hematochezia, severe indigestion/heartburn, hematuria, suspicious skin lesions, and depression.    Physical Exam  General:  morbidly obese.   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 Normal S1,S2.   Abdomen:  abdomen is soft, nontender.  no hepatosplenomegaly.   not distended.  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   Rectal:  sees urology  Genitalia:  sees urology  Prostate:  sees urology  Msk:  muscle bulk and strength are grossly normal.  no obvious joint swelling.  gait is normal and steady  Pulses:  dorsalis pedis intact bilat.  no carotid bruit Extremities:  no deformity.  no ulcer on the feet.  feet are  of normal color and temp.   skin on the feet is very dry toenails are very long. trace right pedal edema and trace left pedal edema.   Neurologic:  cn 2-12 grossly intact.   readily moves all 4's.   sensation is intact to touch on the feet  Skin:  normal texture and temp.  no rash.  not diaphoretic  Cervical Nodes:  No significant adenopathy.  Psych:  Alert and cooperative; normal mood and affect; normal attention span and concentration.   Additional Exam:  SEPARATE EVALUATION FOLLOWS--EACH PROBLEM HERE IS NEW, NOT RESPONDING TO TREATMENT, OR POSES SIGNIFICANT RISK TO THE PATIENT'S HEALTH: HISTORY OF THE PRESENT ILLNESS: he takes bp meds as rx'ed. he  takes zocor as rx'ed w/u of anemia has been neg pt states many years of mod bilat knee pain, but no assoc numbness. PAST MEDICAL HISTORY reviewed and up to date today REVIEW OF SYSTEMS: no change in chrnic sob.  denies weight loss PHYSICAL EXAMINATION: see vs page clear to auscultation.  no respiratory distress knees are normal to my exam LAB/XRAY RESULTS: LDL Cholesterol           50 mg/dL    Hemoglobin           [L]  12.0 g/dL                   40.9-81.1 Hematocrit           [L]  36.0 %         Microalbumin Ratio   [H]  42.3 mg/g        IMPRESSION: dyslipidemia.  zocor can interfere with norvasc microalbuminuria, prob due to htn and dm htn, needs increased rx knee pain, uncertain etiology anemia, uncertain etiology. PLAN: see instruction sheet   Impression & Recommendations:  Problem # 1:  ROUTINE GENERAL MEDICAL EXAM@HEALTH  CARE FACL (ICD-V70.0)  Medications Added to Medication List This Visit: 1)  Simvastatin 20 Mg Tabs (Simvastatin) .Marland Kitchen.. 1 tab at bedtime  Other Orders: Flu Vaccine 36yrs + MEDICARE PATIENTS (B1478) Administration Flu vaccine - MCR (G0008) TLB-Lipid Panel (80061-LIPID) TLB-BMP (Basic Metabolic Panel-BMET) (80048-METABOL) TLB-CBC Platelet - w/Differential (85025-CBCD) TLB-Hepatic/Liver Function Pnl (80076-HEPATIC) TLB-TSH (Thyroid Stimulating Hormone) (84443-TSH) TLB-IBC Pnl (Iron/FE;Transferrin) (83550-IBC) TLB-B12 + Folate Pnl (82746_82607-B12/FOL) TLB-A1C / Hgb A1C (Glycohemoglobin) (83036-A1C) TLB-Microalbumin/Creat Ratio, Urine (82043-MALB) TLB-PSA (Prostate Specific Antigen) (84153-PSA) TLB-Udip w/ Micro (81001-URINE) TLB-Uric Acid, Blood (84550-URIC) Est. Patient Level IV ([address]) Est. Patient 40-64 years (13086)  Patient Instructions: 1)  blood tests are being ordered for you today.  please call [phone] to hear your test results. 2)  please consider these measures for your health:  minimize alcohol.  do not use tobacco products.  have a  colonoscopy at least every 10 years from age 10.  keep firearms safely stored.  always use seat belts.  have working smoke alarms in your home.  see an eye doctor and dentist regularly.  never drive under the influence of alcohol or drugs (including prescription drugs).   3)  please let me know what your wishes would be, if artificial life support measures should become necessary.  it is critically important to prevent falling down (keep floor areas well-lit, dry, and free of loose objects) 4)  add tekturna 150 mg once daily.  here are some samples.  you can take 1 of 150 mg, or 1/2 of 300 mg. 5)  Please schedule a follow-up appointment in 4 months. 6)  go back to the foot center as  soon as you can, because you are overdue to have your toenails clipped. 7)  (update: i left message on phone-tree:  reduce zocor to 20 mg once daily.  we'll follow the anemia and proteinuria.) Prescriptions: SIMVASTATIN 20 MG TABS (SIMVASTATIN) 1 tab at bedtime  #30 x 11   Entered and Authorized by:   Minus Breeding MD   Signed by:   Minus Breeding MD on 09/29/2009   Method used:   Electronically to        [facility] (retail)       [address]       [place], [place]  [address]       Ph: [phone]       Fax: [phone]   RxID:   [id number]           Flu Vaccine Consent Questions     Do you have a history of severe allergic reactions to this vaccine? no    Any prior history of allergic reactions to egg and/or gelatin? no    Do you have a sensitivity to the preservative Thimersol? no    Do you have a past history of Guillan-Barre Syndrome? no    Do you currently have an acute febrile illness? no    Have you ever had a severe reaction to latex? no    Vaccine information given and explained to patient? yes    Are you currently pregnant? no    Lot Number:[id number]   Exp Date:07/16/2010   Site Given  Right Deltoid IMu

## 2010-02-15 NOTE — Miscellaneous (Signed)
Summary: [facility]  [facility]   Imported By: Lester  03/09/2009 09:45:42  _____________________________________________________________________  External Attachment:    Type:   Image     Comment:   External Document

## 2010-02-15 NOTE — Letter (Signed)
[facility]: [facility]   Imported By: Sherian Rein 08/30/2009 09:46:14  _____________________________________________________________________  External Attachment:    Type:   Image     Comment:   External Document

## 2010-02-15 NOTE — Medication Information (Signed)
Summary: ccr   Anticoagulant Therapy  Managed by: Weston Brass, PharmD Referring MD: Sandrea Hughs PCP: Minus Breeding MD Supervising MD: Ladona Ridgel MD, Sharlot Gowda Indication 1: Pulmonary embolus (ICD-415.19) Lab Used: [facility] [place] Site: [address] INR POC 3.3 INR RANGE 2 - 3  Dietary changes: no    Health status changes: no    Bleeding/hemorrhagic complications: no    Recent/future hospitalizations: no    Any changes in medication regimen? no    Recent/future dental: no  Any missed doses?: no       Is patient compliant with meds? yes       Allergies: No Known Drug Allergies  Anticoagulation Management History:      The patient is taking warfarin and comes in today for a routine follow up visit.  Positive risk factors for bleeding include presence of serious comorbidities.  Negative risk factors for bleeding include an age less than 37 years old.  The bleeding index is 'intermediate risk'.  Positive CHADS2 values include History of HTN and History of Diabetes.  Negative CHADS2 values include Age > 49 years old.  The start date was 01/16/2002.  Anticoagulation responsible provider: Ladona Ridgel MD, Sharlot Gowda.  INR POC: 3.3.  Cuvette Lot#: [id number].  Exp: 11/2010.    Anticoagulation Management Assessment/Plan:      The patient's current anticoagulation dose is Warfarin sodium 10 mg tabs: Use as directed by Anticoagulation Clinic.  The target INR is 2 - 3.  The next INR is due 11/03/2009.  Anticoagulation instructions were given to patient.  Results were reviewed/authorized by Weston Brass, PharmD.  He was notified by Weston Brass PharmD.         Prior Anticoagulation Instructions: INR 1.2 Today and Friday take 10mg s. Then resume regular dose of 10mg s everyday except 5mg s on Mondays, Wednesdays and Fridays. Recheck in 10 days.   Current Anticoagulation Instructions: INR 3.3  Skip today's dose of Coumadin then resume your same schedule of 10mg  on Sunday, Tuesday, Thursday and  Saturday, 5mg  on Monday and Thursday and no Coumadin on Wednesday.  Recheck INR in 3 weeks.

## 2010-02-17 NOTE — Letter (Signed)
Summary: [facility]  [facility]  [address]   [place], [place] [address]   Phone: [phone]  Fax: [phone]     February 07, 2010 MRN: [id number]   PARTH MCCORMAC [address] [place], [place]  [address]   Dear Mr. Kutsch,  This letter is being sent to you as a reminder that it is necessary for you to get your INR/PT checked regularly so that we can optimize your care.  Our records indicate that you were scheduled to have a test done recently.  As of today, we have not received the results of this test.  It is very important that you have your INR checked.  Please call our office at the number listed above to schedule an appointment at your earliest convenience.    If you have recently had your protime checked or have discontinued this medication, please contact our office at the above phone number to clarify this issue.  Thank you for this prompt attention to this important health care matter.  Sincerely,   [facility] Team    Appended Document: [facility] Coumadin 1 Spoke with Pt's mom, and she will give him message to call us.

## 2010-02-17 NOTE — Progress Notes (Signed)
Summary: rx refill req  Phone Note Refill Request Message from:  Fax from Pharmacy on January 25, 2010 8:38 AM  Refills Requested: Medication #1:  CLONIDINE HCL 0.2 MG TABS 1 tid   Dosage confirmed as above?Dosage Confirmed   Last Refilled: 09/11/2009  Method Requested: Electronic Next Appointment Scheduled: none Initial call taken by: Brenton Grills CMA Duncan Dull),  January 25, 2010 8:39 AM    Prescriptions: CLONIDINE HCL 0.2 MG TABS (CLONIDINE HCL) 1 tid  #100 Each x 2   Entered by:   Brenton Grills CMA (AAMA)   Authorized by:   Minus Breeding MD   Signed by:   Brenton Grills CMA (AAMA) on 01/25/2010   Method used:   Electronically to        [facility] (retail)       [address]       [place], [place]  [address]       Ph: [phone]       Fax: [phone]   RxID:   [id number]

## 2010-02-22 ENCOUNTER — Telehealth: Payer: Self-pay | Admitting: Endocrinology

## 2010-02-23 NOTE — Medication Information (Signed)
Summary: coumadin ck/mt  Anticoagulant Therapy  Managed by: Cloyde Reams, RN, BSN Referring MD: Sandrea Hughs PCP: Minus Breeding MD Supervising MD: Antoine Poche MD, Fayrene Fearing Indication 1: Pulmonary embolus (ICD-415.19) Lab Used: [facility] [place] Site: [address] INR POC 1.2 INR RANGE 2 - 3  Dietary changes: no    Health status changes: no    Bleeding/hemorrhagic complications: no    Recent/future hospitalizations: no    Any changes in medication regimen? no    Recent/future dental: no  Any missed doses?: yes     Details: Missed 1 week of Coumadin, ran out of meds needs refill.   Is patient compliant with meds? yes       Allergies: No Known Drug Allergies  Anticoagulation Management History:      The patient is taking warfarin and comes in today for a routine follow up visit.  Positive risk factors for bleeding include presence of serious comorbidities.  Negative risk factors for bleeding include an age less than 36 years old.  The bleeding index is 'intermediate risk'.  Positive CHADS2 values include History of HTN and History of Diabetes.  Negative CHADS2 values include Age > 66 years old.  The start date was 01/16/2002.  Anticoagulation responsible provider: Antoine Poche MD, Fayrene Fearing.  INR POC: 1.2.  Cuvette Lot#: [id number].  Exp: 01/2011.    Anticoagulation Management Assessment/Plan:      The patient's current anticoagulation dose is Warfarin sodium 10 mg tabs: Use as directed by Anticoagulation Clinic.  The target INR is 2 - 3.  The next INR is due 03/02/2010.  Anticoagulation instructions were given to patient.  Results were reviewed/authorized by Cloyde Reams, RN, BSN.  He was notified by Cloyde Reams RN.         Prior Anticoagulation Instructions: INR 2.5 Continue taking 1 tablet on sunday, tuesday, thursday, and saturday. And a half tablet on monday and friday. And no coumadin on wednesday. Recheck in 3 weeks.   Current Anticoagulation Instructions: INR  1.2  Take 1 tablet  today and tomorrow, then resume same dosage 1 tablet on Sundays, Tuesdays, Thursdays, and Saturdays and take 1/2 tablet on Mondays and Fridays, and no Coumadin on Wednesdays.   Prescriptions: WARFARIN SODIUM 10 MG TABS (WARFARIN SODIUM) Use as directed by Anticoagulation Clinic  #30 x 1   Entered by:   Erika Johnson RN   Authorized by:   Jeni Duling, MD, FACC   Signed by:   Erika Johnson RN on 02/14/2010   Method used:   Electronically to        [facility] (retail)       [address]       [place], [place]  [address]       Ph: [phone]       Fax: [phone]   RxID:   [id number]

## 2010-03-02 ENCOUNTER — Encounter (INDEPENDENT_AMBULATORY_CARE_PROVIDER_SITE_OTHER): Payer: Medicare Other

## 2010-03-02 ENCOUNTER — Encounter: Payer: Self-pay | Admitting: Internal Medicine

## 2010-03-02 DIAGNOSIS — I2699 Other pulmonary embolism without acute cor pulmonale: Secondary | ICD-10-CM

## 2010-03-02 DIAGNOSIS — Z7901 Long term (current) use of anticoagulants: Secondary | ICD-10-CM

## 2010-03-03 NOTE — Progress Notes (Signed)
Summary: new rx for BP med?  Phone Note Call from Patient Call back at Home Phone [phone]   Caller: Patient Reason for Call: Talk to Nurse Summary of Call: Requesting call back concerning medications. Initial call taken by: Orlan Leavens RMA,  February 22, 2010 2:16 PM  Follow-up for Phone Call        pt would like rx for Tekturna 150mg  to be sent to [facility], he received samples at his last OV in 09/2009. Follow-up by: Brenton Grills CMA Duncan Dull),  February 22, 2010 3:55 PM  Additional Follow-up for Phone Call Additional follow up Details #1::        ov is due.  can we address then? Additional Follow-up by: Minus Breeding MD,  February 22, 2010 4:04 PM    Additional Follow-up for Phone Call Additional follow up Details #2::    pt states he will callback office once he checks his schedule to schedule F/U OV  Follow-up by: Brenton Grills CMA Duncan Dull),  February 22, 2010 4:11 PM  Additional Follow-up for Phone Call Additional follow up Details #3:: Details for Additional Follow-up Action Taken: i have refilled x 1, pending ov Additional Follow-up by: Minus Breeding MD,  February 22, 2010 4:16 PM  New/Updated Medications: TEKTURNA 150 MG TABS (ALISKIREN FUMARATE) 1 tab once daily Prescriptions: TEKTURNA 150 MG TABS (ALISKIREN FUMARATE) 1 tab once daily  #30 x 0   Entered and Authorized by:   Minus Breeding MD   Signed by:   Minus Breeding MD on 02/22/2010   Method used:   Electronically to        [facility] (retail)       [address]       [place], [place]  [address]       Ph: [phone]       Fax: [phone]   RxID:   [id number]  Pt advised that 30 day supply sent to pharmacy pending OV Margaret Pyle, CMA  February 22, 2010 4:24 PM

## 2010-03-09 NOTE — Medication Information (Signed)
Summary: Coumadin Clinic  Anticoagulant Therapy  Managed by: Cloyde Reams, RN, BSN Referring MD: Sandrea Hughs PCP: Minus Breeding MD Supervising MD: Gala Romney MD, Reuel Boom Indication 1: Pulmonary embolus (ICD-415.19) Lab Used: [facility] [place] Site: [address] INR RANGE 2 - 3  Dietary changes: no    Health status changes: no    Bleeding/hemorrhagic complications: no    Recent/future hospitalizations: no    Any changes in medication regimen? no    Recent/future dental: no  Any missed doses?: no       Is patient compliant with meds? yes       Allergies: No Known Drug Allergies  Anticoagulation Management History:      Positive risk factors for bleeding include presence of serious comorbidities.  Negative risk factors for bleeding include an age less than 2 years old.  The bleeding index is 'intermediate risk'.  Positive CHADS2 values include History of HTN and History of Diabetes.  Negative CHADS2 values include Age > 33 years old.  The start date was 01/16/2002.  Anticoagulation responsible provider: Varian Innes MD, Reuel Boom.  Exp: 01/2011.    Anticoagulation Management Assessment/Plan:      The patient's current anticoagulation dose is Warfarin sodium 10 mg tabs: Use as directed by Anticoagulation Clinic.  The target INR is 2 - 3.  The next INR is due 03/16/2010.  Anticoagulation instructions were given to patient.  Results were reviewed/authorized by Cloyde Reams, RN, BSN.  He was notified by Cloyde Reams RN.         Prior Anticoagulation Instructions: INR 1.2  Take 1 tablet  today and tomorrow, then resume same dosage 1 tablet on Sundays, Tuesdays, Thursdays, and Saturdays and take 1/2 tablet on Mondays and Fridays, and no Coumadin on Wednesdays.    Current Anticoagulation Instructions: INR 1.9  Take 1/2 tablet today, then start taking 1 tablet daily except 1/2 tablet on Mondays, Wednesdays, and Fridays.  Recheck in 2 weeks.

## 2010-03-16 ENCOUNTER — Encounter: Payer: Self-pay | Admitting: Endocrinology

## 2010-03-16 DIAGNOSIS — I2699 Other pulmonary embolism without acute cor pulmonale: Secondary | ICD-10-CM

## 2010-03-16 HISTORY — DX: Other pulmonary embolism without acute cor pulmonale: I26.99

## 2010-03-21 ENCOUNTER — Encounter: Payer: Self-pay | Admitting: Cardiology

## 2010-03-21 ENCOUNTER — Encounter (INDEPENDENT_AMBULATORY_CARE_PROVIDER_SITE_OTHER): Payer: Medicare Other

## 2010-03-21 DIAGNOSIS — I2699 Other pulmonary embolism without acute cor pulmonale: Secondary | ICD-10-CM

## 2010-03-21 DIAGNOSIS — Z7901 Long term (current) use of anticoagulants: Secondary | ICD-10-CM

## 2010-03-29 NOTE — Medication Information (Signed)
Summary: rov/ewj   Anticoagulant Therapy  Managed by: Georgina Pillion, PharmD Referring MD: Sandrea Hughs PCP: Minus Breeding MD Supervising MD: Daleen Squibb MD, Maisie Fus Indication 1: Pulmonary embolus (ICD-415.19) Lab Used: [facility] [place] Site: [address] INR POC 4.1 INR RANGE 2 - 3  Dietary changes: no    Health status changes: no    Bleeding/hemorrhagic complications: no    Recent/future hospitalizations: no    Any changes in medication regimen? no    Recent/future dental: no  Any missed doses?: no       Is patient compliant with meds? yes       Allergies: No Known Drug Allergies  Anticoagulation Management History:      Positive risk factors for bleeding include presence of serious comorbidities.  Negative risk factors for bleeding include an age less than 75 years old.  The bleeding index is 'intermediate risk'.  Positive CHADS2 values include History of HTN and History of Diabetes.  Negative CHADS2 values include Age > 34 years old.  The start date was 01/16/2002.  Anticoagulation responsible provider: Daleen Squibb MD, Maisie Fus.  INR POC: 4.1.  Cuvette Lot#: [id number].  Exp: 01/2011.    Anticoagulation Management Assessment/Plan:      The patient's current anticoagulation dose is Warfarin sodium 10 mg tabs: Use as directed by Anticoagulation Clinic.  The target INR is 2 - 3.  The next INR is due 04/04/2010.  Anticoagulation instructions were given to patient.  Results were reviewed/authorized by Georgina Pillion, PharmD.  He was notified by Georgina Pillion PharmD.         Prior Anticoagulation Instructions: INR 1.9  Take 1/2 tablet today, then start taking 1 tablet daily except 1/2 tablet on Mondays, Wednesdays, and Fridays.  Recheck in 2 weeks.    Current Anticoagulation Instructions: Hold coumadin today only. Then take 1/2 tablet on Tuesday and Wednesday. Then change regimen to take 1/2 tablet (5 mg) daily EXCEPT for 1 tablet (10 mg) on Mondays, Wednesdays, and  Fridays.  INR 4.1

## 2010-04-25 ENCOUNTER — Other Ambulatory Visit: Payer: Self-pay | Admitting: Endocrinology

## 2010-04-26 NOTE — Telephone Encounter (Signed)
1 mo without refills pending further eval by PCP - thanks

## 2010-04-26 NOTE — Telephone Encounter (Signed)
SAE pt, last refilled 11/01/09 with 5 refills. Last OV 09/29/2009.

## 2010-04-26 NOTE — Telephone Encounter (Signed)
Rx faxed to [facility].

## 2010-05-02 ENCOUNTER — Other Ambulatory Visit: Payer: Self-pay | Admitting: Cardiology

## 2010-05-09 ENCOUNTER — Ambulatory Visit (INDEPENDENT_AMBULATORY_CARE_PROVIDER_SITE_OTHER): Payer: Medicare Other | Admitting: *Deleted

## 2010-05-09 DIAGNOSIS — I2699 Other pulmonary embolism without acute cor pulmonale: Secondary | ICD-10-CM

## 2010-05-24 ENCOUNTER — Telehealth: Payer: Self-pay | Admitting: *Deleted

## 2010-05-24 MED ORDER — ALPRAZOLAM 2 MG PO TABS: 2.0000 mg | ORAL_TABLET | Freq: Every evening | ORAL | Status: DC | PRN

## 2010-05-24 NOTE — Telephone Encounter (Signed)
i refilled No further refills until ov

## 2010-05-24 NOTE — Telephone Encounter (Signed)
Rx faxed to pharmacy  

## 2010-05-24 NOTE — Telephone Encounter (Signed)
R'cd fax from [facility] [place] [facility]  Pharmacy for refill of pt's Alprazolam-please advise  Last Filled-04/26/2010

## 2010-05-30 ENCOUNTER — Other Ambulatory Visit: Payer: Self-pay | Admitting: Endocrinology

## 2010-05-31 NOTE — Assessment & Plan Note (Signed)
[facility]                            CARDIOLOGY OFFICE NOTE   Travis Chapman, Travis Chapman                          MRN:          [id number]  DATE:06/18/2007                            DOB:          1945/12/21    PRIMARY CARE PHYSICIAN:  [name] A. Everardo All, MD   REASON FOR CONSULTATION:  Evaluate patient with dyspnea.   HISTORY OF PRESENT ILLNESS:  The patient is a pleasant 65 year old  gentleman who was seen in this clinic in 2006 most recently for  evaluation of heart failure with preserved ejection fraction.  His other  cardiac workup has included catheterization in 2002 with no coronary  disease.  He has had echocardiograms most recently dating back to 2006  demonstrating a well-preserved ejection fraction of 60%.  There has been  evidence of mild diastolic dysfunction.   He is now referred back for evaluation of dyspnea.  He has had  progressive shortness of breath over time.  There has been no acute or  abrupt change.  However, he is now dyspneic walking a short distance on  level ground.  He is not describing any resting shortness of breath.  He  did have ventilator-dependent respiratory failure 3 years ago.  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  With this, he sleeps through the night and does not have any  difficulty waking with shortness of breath.  He does not describe any  orthopnea.  He does not have any chest pressure, neck or arm discomfort.  He does not have palpitations, presyncope or syncope.  Has has had no  swelling or new weight gain.   PAST MEDICAL HISTORY:  1. Respiratory failure with subsequent tracheostomy as described      above.  2. Heart failure with well-preserved ejection fraction.  3. COPD.  4. Diabetes mellitus x20 years.  5. Hypertension x20 years.  6. Hyperlipidemia x20 years.  7. Pulmonary embolism in  2006.  8. Severe sleep apnea.   PAST SURGICAL HISTORY:  1. Tracheostomy.  2. Back surgery.   ALLERGIES:  None.   MEDICATIONS:  1. Mucous relief.  2. Trazodone.  3. Multivitamin.  4. Insulin.  5. Vitamin C.  6. Simvastatin 80 mg daily.  7. Norvasc 10 mg daily.  8. Metoclopramide 5 mg 3 times a day .  9. Klor-Con 40 mg daily.  10.Atacand 8 mg b.i.d.  11.Hydrochlorothiazide 25 mg daily.  12.Clonidine 0.3 mg 3 times a day.  13.Triazolam.  14.Coumadin.   SOCIAL HISTORY:  The patient he is not married.  He has four adult  children.  He lives with his mom.  He quit smoking about 6 years ago.  He does not drink alcohol.   FAMILY HISTORY:  Noncontributory for early coronary artery disease.   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  Otherwise negative for all  other systems.   PHYSICAL EXAMINATION:  GENERAL:  The patient is in no distress.  VITAL SIGNS:  Blood pressure 154/90, heart 60 and regular, weight 359  pounds.  HEENT:  The right cornea is opacified, the pupil not reactive. Left is  round and reactive. Fundi is within normal limits. Oral mucosa  unremarkable.  NECK:  No jugular venous distention at 45 degrees. Carotid upstroke  brisk and symmetric.  No bruits, no thyromegaly.  LYMPHATICS:  No cervical, axillary, inguinal adenopathy.  LUNGS:  Clear to auscultation bilaterally.  BACK:  No costovertebral angle tenderness.  CHEST:  Unremarkable.  HEART:  PMI not displaced or sustained. S1-S2 within normal.  No S3, no  S4, no clicks, rubs, murmurs.  ABDOMEN:  Obese, positive bowel sounds normal in frequency and pitch. No  bruits, rebound, guarding.  No midline pulsatile mass.  No hepatomegaly,  no splenomegaly.  SKIN:  No rashes, no nodules.  EXTREMITIES:  2+ pulse throughout, trace bilateral lower extremity  edema.  No cyanosis or clubbing.  NEUROLOGIC:  Oriented to person,  place, and time. Cranial nerves II-XII  are grossly intact. Motor grossly intact.   ASSESSMENT AND PLAN:  1. Dyspnea.  The patient's dyspnea is probably related to his      hypoventilation obesity syndrome, deconditioning, and weight.      However, cannot exclude the possibility of ischemia.  He certainly      has significant cardiovascular risk factors.  Today I am going to      check a BNP level.  I am going to order an adenosine perfusion      study.  He would not be able to walk on a treadmill.  Further      evaluation and treatment will be based on this.  2. Diastolic heart failure.  Again, I will check a BNP level.  I will      get an echocardiogram as it has been quite awhile.  I may      empirically give him some diuretic going forward to see if his      dyspnea improves.  3. Hypertension.  Blood pressure is slightly elevated today.  However,      he reports that it is well controlled in the 130s/80s usually.  He      needs certainly weight loss.  He avoids salt.  At this point, he      should keep a close eye on blood pressure, but I will not make any      changes to his regimen based on this one reading.  4. Diabetes per Dr. Everardo All.  5. Dyslipidemia.  I would suggest a goal LDL of less than 100, HDL      greater than 40.  6. Obesity.  He understands the need to lose weight with diet and      exercise.  7. Followup.  I will see the patient back based on the results of the      above.     Rollene Rotunda, MD, [facility]  Electronically Signed    JH/MedQ  DD: 06/18/2007  DT: 06/18/2007  Job #: [id number]   cc:   [name] A. Everardo All, MD

## 2010-06-03 NOTE — Cardiovascular Report (Signed)
[place]  Patient:    Travis Chapman, Travis Chapman                          MRN: [id number] Proc. Date: 11/02/99 Adm. Date:  [id number] Attending:  Learta Codding CC:         Justine Null, M.D. [facility]   Cardiac Catheterization  DATE OF BIRTH:  [birth year]  REFERRING PHYSICIAN:  Justine Null, M.D.  CARDIOLOGIST:  Lewayne Bunting, M.D.  DIAGNOSES: 1. Normal coronary anatomy. 2. Elevated systolic blood pressure. 3. Markedly elevated left ventricular end-diastolic pressure consistent with    diastolic dysfunction.  HISTORY:  Mr. Rockhill is a 65 year old male with a longstanding history severe hypertension treated by Dr. Romero Belling.  Over the last couple of months the patient has complained of increased dyspnea on exertion.  The patient had several noninvasive test done which were suggestive of possible coronary artery disease.  He had been referred for diagnostic catheterization to assess his coronary anatomy.  DESCRIPTION OF PROCEDURE:  After informed consent was obtained, the patient was brought to the catheterization laboratory.  The right groin was sterilely prepped and draped.  Lidocaine 1% was used to infiltrate the right groin and a 6 French arterial sheath was placed using the modified Seldinger technique. Subsequently a 6 [place] and JR4 preformed catheters were used to engage the left and right coronary artery.  Angiographic images were obtained in various views using manual injections of contrast.  After coronary angiography a 6 French pigtail catheter was advanced and placed in the left ventricle. Appropriate left-sided hemodynamically were obtained.  There was no ventriculogram done due to known normal LV function and underlying renal insufficiency.  Subsequently, the catheter was pulled back and assessment was made for an aortic valve gradient.  Subsequently the pigtail catheter was pulled back and removed.  An arteriogram was  then obtained in the 55% degree RAO projection to evaluate if the patient could possible be a Perclose closure device candidate.  However, it appeared that the sheath came in right at the level of the bifurcation and it was decided not to Perclose this patient.  At the termination of the case, the catheters and sheaths were removed and manual pressure applied until adequate hemostasis was achieved.  In addition, the patient was given 20 mg of IV labetalol to lower systolic blood pressure. The patient tolerated the procedure well and was transferred to holding are in stable condition.  HEMODYNAMICS:  Aortic pressure 172/104 mmHg.  Left ventricular pressure 172/38 mmHg.  SELECTIVE CORONARY ANGIOGRAPHY: 1. Left main coronary artery was a very large caliber vessel with no    evidence of flow-limiting coronary artery disease. 2. Left anterior descending artery was a large caliber artery giving rise to    several diagonal branches with no flow-limiting coronary artery disease. 3. Left circumflex coronary artery showed no evidence of flow-limiting    coronary artery disease. 4. The right coronary artery is a moderate sized vessel with no evidence of    flow-limiting coronary artery disease.  Coronary system was codominant.  CONCLUSIONS: 1. Normal coronary anatomy. 2. Severe diastolic dysfunction with markedly left ventricular diastolic    pressure. 3. Systemic hypertension.  RECOMMENDATIONS:  The results of the catheterization were discussed with the patient and Dr. Everardo All.  The plan is to increase his Lasix to 120 mg p.o. b.i.d.  The patient with follow up with Dr. Everardo All, ______ and  likely will further increase the patients labetalol. DD:  11/02/99 TD:  11/02/99 Job: [id number]

## 2010-06-03 NOTE — Op Note (Signed)
NAMEMCKINLEY, OLHEISER NO.:  [id number]   MEDICAL RECORD NO.:  [id number]          PATIENT TYPE:  INP   LOCATION:  2110                         FACILITY:  [facility]   PHYSICIAN:  Lucky Cowboy, MD         DATE OF BIRTH:  07-02-1945   DATE OF PROCEDURE:  07/21/2004  DATE OF DISCHARGE:                                 OPERATIVE REPORT   PREOPERATIVE DIAGNOSIS:  Chronic ventilatory dependency.   POSTOPERATIVE DIAGNOSIS:  Chronic ventilatory dependency.   PROCEDURE:  Tracheotomy.   SURGEON:  Lucky Cowboy, M.D.   ANESTHESIA:  General endotracheal anesthesia.   ESTIMATED BLOOD LOSS:  Less than 20 mL.   SPECIMENS:  None.   COMPLICATIONS:  None.   INDICATIONS:  This patient is a 65 year old male who has been intubated and  ventilated since July 09, 2004, due to pulmonary embolus and underlying  chronic pulmonary disease. He also has underlying severe obstructive sleep  apnea. He has failed extubation and weaning attempts and requires  tracheotomy.   FINDINGS:  The patient was noted to have normal tracheal anatomy with a  large trachea. The tracheotomy tube was placed between rings 1 and 2. There  was a large isthmus which was tied off in the midline.   DESCRIPTION OF PROCEDURE:  The patient is taken to the operating room and  placed on the table in the supine position. He was then placed under general  endotracheal anesthesia. The neck was prepped with Betadine and draped in  the usual sterile fashion. A transverse incision was made using Bovie  cautery over the cricoid cartilage. Lipectomy was performed using Bovie  cautery. Bovie was used to divide the strap muscles and the median raphe and  the cricoid cartilage was identified. It was divided from its thyroid  attachments down to the trachea. The thyroid isthmus was elevated using  Bovie cautery off of the tracheal cartilage. The isthmus was doubly clamped  with a right angle clamps and divided using Bovie cautery.  The isthmus was  tied off using 2-0 silk suture. Please note that there was a right anterior  jugular that was also required to be tied off using 2-0 silk suture. At this  point, a tracheal hook was placed and reflected superiorly. A #15 blade was  used to enter the trachea which was extended using Metzenbaum scissors. This  was performed between rings 1 and 2. A 2-0 silk suture was placed around  rings 2 and 1. Two knots were placed in the upper suture and one in the  lower suture. This was performed to serve the function as stay sutures. The  existing endotracheal tube was removed superiorly and a #8 cuffed Shiley  tube placed in the lumen and the cuff inflated. Breath sounds were equal and  CO2 was returned. The tracheotomy tube was secured to the  neck in four quadrant locations using 2-0 silk suture in a simple  interrupted fashion. It was further secured using Velcro trach tie. The  patient was then transported back to the intensive care unit in stable  condition. There were no complications.       SJ/MEDQ  D:  07/21/2004  T:  07/21/2004  Job:  [id number]   cc:   Ladora Daniel, Nose and Throat

## 2010-06-03 NOTE — H&P (Signed)
NAME:  Travis Chapman, Travis Chapman NO.:  [id number]   MEDICAL RECORD NO.:  [id number]          PATIENT TYPE:  EMS   LOCATION:  MAJO                         FACILITY:  [facility]   PHYSICIAN:  Charlton Haws, M.D.     DATE OF BIRTH:  [birth year]   DATE OF ADMISSION:  06/28/2005  DATE OF DISCHARGE:                                HISTORY & PHYSICAL   EMERGENCY ROOM NOTE:  Travis Chapman is a 65 year old gentleman we were asked to  evaluate in the minor site of the ER.  He came to the hospital for  tremulousness. He felt like his heart may have been fluttering.   The patient has a long and complicated history.  He has significant morbid  obesity. He apparently has COPD and asthma and had a pulmonary embolus which  required a prolonged hospitalization in July of 2006 with pulmonary emboli,  prolonged ventilation, needing endotracheal intubation and eventually  tracheostomy.  He is currently on a ventilator at home at night.  He only  knows that he is on 3 liters but his daughter describes positive pressure  ventilation and an actual ventilator at the house.   His primary care physician is Dr. Everardo All and he sees Dr. Sherene Sires.   The patient has not had any significant chest pain.  He does not feel like  his breathing is any worse than normal.  His daughter indicate that she  thought he might have been under increased stress and anxiety since he ran  out of some of his blood pressure medicine 2 days ago. His pharmacy  substituted amlodipine for Cardene.   He has since had all his medications.   The patient has not had any significant chest pain or syncope, his breathing  is stable.   In talking to the daughter, the patient has not had any undue edema, no  cough, no sputum and no fever.   REVIEW OF SYSTEMS:  Otherwise remarkable for difficulty speaking due to  having to plug his trache.  He is morbidly obese.  He does not do much  around the house.   PAST MEDICAL HISTORY:  Is otherwise  remarkable for significant hypertension.  He has no documented coronary disease and had a clean cath in 2001.   He is seen by Dr. Andee Lineman in our group.   He last saw Dr. Andee Lineman this past year and had no acute problems.   MEDICATIONS:  1.  Klor-Con b.i.d.  2.  Simvastatin 80 daily.  3.  Amlodipine 10 daily.  4.  Metoclopramide 5 mg t.i.d.  5.  Hygroton.  6.  Warfarin 10 mg daily (is followed in our [facility]).  7.  Triazolam 0.2 mg two at h.s.  8.  Lasix 20 daily.  9.  Atacand 8 mg two tablets once a day.  10. Clonidine 0.3 mg t.i.d.  11. Lantus 25 units at approximately 10:30 at night.  12. Over-the-counter Mucinex.  13. Multivitamins.   ALLERGIES:  The patient denies any allergies.   The patient does not have a history of atrial fibrillation or ventricular  tachycardia.   PHYSICAL EXAMINATION:  GENERAL:  Exam is remarkable for morbid obesity. He  is in no distress, he does seem a bit anxious.  RESPIRATORY:  He has minimal end-expiratory wheezes.  His trache area looks  chronic with no acute infection.  CARDIOVASCULAR:  His heart sounds are distant. His JVP is not visible.  ABDOMEN:  Benign, there is no palpable mass and no tenderness.  EXTREMITIES:  He has +1 to 2 lower extremity edema which his daughter says  is chronic.   The patient is not married.  He has four children who are in good health. He  has five grandchildren. He is currently not smoking. He denies alcohol.   His EKG shows sinus rhythm, he has occasional PVCs on the monitor, there are  no acute changes.   He had an echocardiogram from July of 2006 which showed normal LV function  and no cor pulmonale. No significant valvular disease.   The patient's lab work was remarkable for a hematocrit of 41.2, platelet  count of 216,000, white count was only 8900 without a left shift. His  potassium is 3.6, BUN 21, creatinine 1. 4.   The patient's D-dimer was less than 0.22.  BNP was less than 30.  Liver   function tests were normal at the time of this dictation. An INR was not  drawn.   Chest x-ray showed no acute problems with minimal cardiomegaly, trache site  and no acute infiltrates.   IMPRESSION:  I am not sure what to make of the patient's tremulousness.  I  watched him in the room for over 10 minutes and they are clearly not related  to his PVCs. He has no history of coronary disease and normal left  ventricular function.  I do not think that he is high risk for malignant  arrhythmias. His breathing status is stable per his daughter, and his trache  site seems fine.  His lab work is in order.   He may be a bit anxious due to his medication changes.  At this time I see  no reason to admit the patient. He has follow up appointments next week with  Dr. Everardo All and Dr. Sherene Sires.   They may want to further adjust his Xanax dose.   The patient does have albuterol but has not been using it lately.   I discussed this plan with patient and the daughter and they were  comfortable with being discharged.           ______________________________  Charlton Haws, M.D.     PN/MEDQ  D:  06/28/2005  T:  06/28/2005  Job:  [id number]

## 2010-06-03 NOTE — H&P (Signed)
[facility]  Patient:    Travis Chapman, Travis Chapman Visit Number: [id number] MRN: [id number]          Service Type: MED Location: 3W 0373 01 Attending Physician:  Learta Codding Dictated by:   Lewayne Bunting, M.D. [facility] Admit Date:  11/16/2000   CC:         Justine Null, M.D. [facility]  E. Graceann Congress, M.D. [facility]   History and Physical  REASON FOR ADMISSION:  New onset congestive heart failure with marked shortness of breath and lower extremity edema.  HISTORY OF PRESENT ILLNESS:  The patient is a 65 year old African-American male, morbidly obese, with a long-standing history of severe hypertension, which has been rather difficult to manage.  The patient is followed for this problem by Dr. Romero Belling and is on a multiple drug regimen.  The patient also has a prior history of substernal chest pain and was evaluated for coronary artery disease approximately a year ago.  He underwent a cardiac catheterization, which did not reveal any evidence of flow-limiting coronary artery disease.  Of note, was that he, however, had severe diastolic dysfunction with marked elevated left ventricular and diastolic pressure of 38-40 mmHg.  The patient was treated medically and placed on diuretic regimen. Over the last year, he had been doing well.  He also has a history of obstructive sleep apnea which is severe and has required BiPAP in the past. The patient, however, is very uncomfortable in BiPAP and refuses to wear this at this point in time.  He presented today to the office with complaints of increased shortness of breath.  He reports increased dyspnea on exertion of almost 2-3 weeks. However, in the last week, he became markedly dyspneic and was unable to do any further activity.  He was sitting up most of the time in his chair, unable to lie flat.  Today he called the office, complaining of extreme tightness and marked dyspnea, as well as audible wheezing.  He also reports  over the last week marked lower extremity edema.  He is positive for orthopnea and PND.  He denies, however, palpitations or syncope.  He denies nausea or vomiting.  It is not entirely clear what the patients weight is, but he appears to have gained a significant amount of weight since his last clinic visit.  ALLERGIES:  No known drug allergies.  PAST MEDICAL HISTORY:  1. Hypertension, severe, on multiple drug regimen including minoxidil,     followed by Dr. Romero Belling.  2. Severe diastolic dysfunction secondary to hypertensive heart disease, as     well as diabetes mellitus.  Last catheterization showed a markedly     elevated left ventricular end-diastolic pressure of 38-40 mmHg.  3. Status post __ cardiac catheterization with no evidence of     coronary artery disease, on November 02, 1999.  4. Obstructive sleep apnea, noncompliant with BiPAP, possibly also     hypertension, although we do not have an actual measurement of this.  5. Diabetes mellitus followed by Dr. Romero Belling.  6. Questionable history of atrial fibrillation, not documented, currently     normal sinus rhythm.  7. History of asthma and bronchospasms.  MEDICATIONS:  1. Diovan 320 mg p.o. q.d.  2. Actos 45 mg a day.  3. Norvasc 10 mg a day.  4. Aspirin one tablet daily.  5. Lasix 80 mg p.o. b.i.d.  6. Vioxx 25 mg p.o. q.d.  7. Normodyne 1200 mg p.o. b.i.d.  8. Advair  Diskus one puff b.i.d.  9. Minoxidil 10 mg p.o. q.d. 10. Lactulose 30 cc p.o. b.i.d 11. Mevacor 20 mg p.o. q.d. 12. Glucophage 1000 mg p.o. b.i.d.  FAMILY HISTORY/SOCIAL HISTORY:  The patient is not married.  He has four children who are in good health.  He has five grandchildren.  He does not smoke.  He used to smoke, however, one pack every 3-4 days.  He denies any alcohol use.  Family history is noncontributory.  REVIEW OF SYSTEMS:  As per HPI.  No nausea, vomiting, fever, or chills, audible wheezing, orthopnea, PND, no palpitations or  syncope.  PHYSICAL EXAMINATION:  VITAL SIGNS:  Blood pressure is 142/82 with a heart rate of 73 beats per minute.  Weight is unclear.  GENERAL:  Markedly obese African-American male in moderate distress with audible wheezing.  HEENT:  Pupils isocoric, conjunctivae clear.  NECK:  Supple, no carotid upstroke and no carotid bruits.  Pulses JVP and normal jugular reflex.  LUNGS:  Bilateral wheezing with crackles, approximately one third of the way up.  HEART:  Regular rate and rhythm, distant heart sounds, normal S1, S2, a questionable S3.  No obvious murmurs.  ABDOMEN:  Protuberant.  EXTREMITIES:  Supple extremities with 2+ peripheral pulses, with marked peripheral edema and atrophic skin changes.  LABORATORY:  Total electrocardiogram, low voltage QRS, but no acute ischemic changes.  IMPRESSION/PLAN: 1. Dyspnea.  The patient is in moderate respiratory distress.  He has clear    volume overload with marked wheezing.  This probably represents a    combination of congestive heart failure secondary to diastolic dysfunction,    as well as asthma exacerbation.  BMP level will be obtained, but the    patient will be started on Lasix 80 mg IV t.i.d.  I suspect he will need    several days of aggressive diuresis.  Exacerbating factor to this may well    be worsening asthma, rule out pneumonia.  Chest x-ray is currently pending.    We will give treatment on patients transfer to [facility].  We    will also rule out myocardial infarction with serial troponins, but given    his prior catheterization result, I think this is distinctly unlikely 2. Rule out asthma exacerbation versus bronchitis/bronchopneumonia.  I have    started the patient on inhalant therapy with albuterol/Atrovent and    continue his inhaled steroids.  Review of a chest x-ray, should clarify    whether the patient has underlying pneumonia.  Should also be treated     aggressively for this problem. 3.  Obstructive sleep apnea.  The patient has refused BiPAP in the past, but if    his shortness of breath becomes worse, this should be considered as an    interim therapy. 4. DVT prophylaxis.  Given the patients multiple risk factors for DVT, I have    opted to start him on Lovenox 40 mg q.d. 5. Diabetes mellitus.  Continue his current medications, but creatinine will    need to be followed closely, as Glucophage may be contraindicated in this    patient.  However, I will not make any changes in his medical regimen, as    the patient has been monitored very closely by Dr. Romero Belling. 6. Hypertension.  Blood pressures are well controlled and we will continue    with extensive multidrug regimen including minoxidil.  DISPOSITION:  I anticipate the patient will stay in the hospital for several days until he  has been adequately diuresed. Dictated by:   Lewayne Bunting, M.D. [facility] Attending Physician:  Learta Codding DD:  11/16/00 TD:  11/17/00 Job: [id number]

## 2010-06-03 NOTE — Discharge Summary (Signed)
NAMELY, BACCHI NO.:  [id number]   MEDICAL RECORD NO.:  [id number]          PATIENT TYPE:  INP   LOCATION:  3101                         FACILITY:  [facility]   PHYSICIAN:  Shan Levans, M.D. LHCDATE OF BIRTH:  Mar 15, 1945   DATE OF ADMISSION:  07/09/2004  DATE OF DISCHARGE:  07/29/2004                                 DISCHARGE SUMMARY   CONSULTING PHYSICIAN:  Lucky Cowboy, MD.   PROCEDURES:  1.  Tracheostomy placed July 21, 2004.  2.  July 25, 2004, endotracheal tube replaced.  3.  July 10, 2004, endotracheal intubation.  4.  July 10, 2004, [place] line.   DISCHARGE DIAGNOSES:  1.  Acute on chronic respiratory failure secondary to obstructive sleep      apnea and obesity hypoventilation syndrome.  2.  Ventilator dependent respiratory failure secondary to #1.  3.  Acute tracheal bronchitis.  4.  History of pulmonary embolus.  5.  Chronic obstructive pulmonary disease.   BRIEF HISTORY:  [name] is a 65 year old morbidly obese African-American  gentleman who was admitted on July 06, 2004 with a chief complaint of left  flank pain x3 days, and three days of increasing/progressive left-sided  thoracic knife-like pain.  His original admitting ABG was 7.31, PCO2 73, PO2  55, bicarbonate 36.  His admitting chest CAT scan demonstrated a left upper  lobe pulmonary embolus.  He was admitted to the medical ward, developed  progressive somnolence with hypoxic and hypercarbic respiratory failure.  He  was moved to intensive care on June 22 and supported on noninvasive positive  pressure ventilation.  His blood gasses continued to worsen.  He was placed  intermittently on BiPAP support, but was noncompliant with this.  He was  maintained on this regimen up until July 10, 2004.  At which time he was  transferred from [facility] to [facility] on the request  of his family.  He continued to demonstrate both hypoxic and hypercarbic  respiratory failure.  He  was intubated on July 10, 2004.   HOSPITAL COURSE BY DISCHARGE DIAGNOSIS:  1.  Ventilatory dependent respiratory failure secondary to obesity      hypoventilation syndrome and obstructive sleep apnea complicated by      tracheal bronchitis and chronic obstructive pulmonary disease.  The      patient was admitted to the hospital emergently at [facility],      then transferred later to [facility] for definitive treatment.      At which time he was intubated and at first empirically treated with      antibiotics, IV steroids, and aggressive bronchodilator regimen.  It was      found at this time that his underlying chronic obstructive pulmonary      disease may have been the major complicating factor influencing the      patient's progressive respiratory failure.  The patient was maintained      on mechanical ventilation.  He continued to demonstrate improvement in      his level of consciousness and gas exchange.  He was later extubated      after positive response to current regimen.  Unfortunately,      approximately 48 hours status post extubation, the patient again became      increasing somnolent, more hypoxic, and developed progressive      respiratory failure.  Because of this, he was again re-intubated, placed      back on full ventilator support.  The patient then underwent      tracheostomy placement on July 21, 2004 by Dr. Alita Chyle.  Since this      time, he has continued to make slow, but steady improvement in his      weaning status.  He is currently cycling on and off the ventilator.      However, this has been complicated secondary to an acute tracheal      bronchitis which has complicated his weaning process.  Culture data on      this has been negative.  He is currently on day #2 of 10 empiric      Rocephin.  Although the patient has made dramatic improvement in his      weaning progress, due to the complexity of his respiratory failure with      both  obstructive sleep apnea and obesity hypoventilation syndrome, he      still has required night time ventilation and occasional ventilation      during the day.  It is the opinion of Dr. Shan Levans that until the      patient has significant degree of weight loss he will certainly be slow      to wean and the entire time to complete ventilator liberation will be at      least minimally one month if not longer.  The patient will require      extensive physical rehabilitation and weight loss in order to stay off      the ventilator.  2.  Acute pulmonary embolus diagnosed by CT scan.  The patient has been      maintained on therapeutic low-molecular weight heparin throughout his      hospitalization.  This will need to be continued for up to six months      post acute pulmonary embolus.  3.  Chronic obstructive pulmonary disease.  The patient is currently on      inhaled nebulizer budesonide, Atrovent, and Ventolin in which he      receives this three times a day.  Additional therapy includes aggressive      pulmonary toilet and daily diuresis for underlying cor pulmonale.  4.  Hyperglycemia, managed primarily with aggressive insulin protocol.  5.  Acute tracheal bronchitis, currently on empiric day 2/10 for tracheal      bronchitis with IV Rocephin.  6.  Hypertension.  The patient has been managed primarily with diuretic      therapy and Clonidine.   LABORATORY DATA:  Blood gas date July 29, 2004:  PH 7.24, PO2 73, bicarb  40.1.  July 28, 2004 magnesium 2.4.  July 29, 2004 white blood cells 9.2,  hemoglobin 10.3, hematocrit 31.2, platelets 205.  July 29, 2004 PT 28.4, INR  2.7.  July 28, 2004 sodium 143, potassium 2.4, chloride 94, Co2 38, glucose  74, BUN 25, creatinine 1.7, calcium 9.5.  July 26, 2004 chest x-ray stable  pleural parenchymal opacities on the right, tracheostomy tube in place.  SPEECH LANGUAGE PATHOLOGY EVALUATION:  The patient is currently undergoing a  bedside  evaluation for swallowing.  He has been receiving tube feedings via  Osmolite by nasogastric tube.  However, should swallowing evaluation  demonstrate no significant dysphagia, will send recommendations per SLP.   MEDICATIONS:  1.  Warfarin daily.  2.  Budesonide 0.25 mg inhaled q.6h.  3.  Atrovent 0.5 mg inhaled q.6h.  4.  Ventolin 2.5 mg inhaled q.6h.  5.  Chlorhexidine 15 mL oral swab b.i.d.  6.  Catapres 0.2 mg via tube twice daily.  7.  Nasonex two sprays 50 mcg spray twice daily.  8.  Hydrochlorothiazide 25 mg p.o. daily.  9.  Protonix 40 mg p.o. daily.  10. Sliding scale insulin consistently for 60-100 equals 0 units, 101-150      equals three units, 151-200 equals six units, 201-250 equals nine units.  11. Reglan 10 mg IV q.6h.  12. Lantus 30 units subcutaneously every 12 hours.  13. Lovenox 120 mg subcu q.12h.  14. Free water via tube 150 mL every four hours.  15. Ceftriaxone 1 g daily IV, now day three of 10.   ACTIVITY:  Up out of bed t.i.d.   DIET:  Currently Osmolite tube feeds on 55 mL.  Will attach results of  swallowing evaluation to discharge packet.   WOUND CARE:  Trach care as applicable.   FOLLOW UP:  Not applicable.   DISPOSITION:  The patient is currently afebrile.  He is currently undergoing  swallowing evaluation.  Heart rate in the 80s.  Blood pressure is stable at  110/70, saturation is 96-98%,respirations unlabored on trach collar 30%,  respirations approximately 20 breaths per minute.  Chest x-ray with no clear  acute disease.  He is currently awaiting transfer to [facility].       PB/MEDQ  D:  07/29/2004  T:  07/29/2004  Job:  [id number]   cc:   Jackie Plum, M.D.   Lucky Cowboy, MD  [phone] [place]  [place] [address]  Fax: [phone]

## 2010-06-03 NOTE — Discharge Summary (Signed)
[place]  Patient:    Travis Chapman, Travis Chapman Visit Number: [id number] MRN: [id number]          Service Type: MED Location: [phone] Attending Physician:  Learta Codding Dictated by:   Delton See, P.A. Admit Date:  11/16/2000 Disc. Date: 11/24/00   CC:         Justine Null, M.D. [facility]             Barbaraann Share, M.D. [facility]                           Discharge Summary  DATE OF BIRTH:  10-29-1945  BRIEF HISTORY:  Travis Chapman is a 65 year old male with morbid obesity and long-standing history of severe hypertension who has been rather difficult to manage.  The patient has been followed by Dr. Everardo All.  He has a history of substernal chest pain and was evaluated for coronary artery disease with a cardiac catheterization approximately one year ago.  This did not reveal any significant flow-limiting lesions.  The patient does have diastolic dysfunction with marked elevated left ventricular and diastolic pressures of 38-40 mmHg.  The patient has been treated medically with diuretics and had been doing well until the day of admission, when he presented to the office with increased shortness of breath.  The patient was admitted for further evaluation and treatment of congestive heart failure.  PAST MEDICAL HISTORY:  Significant for obstructive sleep apnea.  The patient was prescribed BiPAP in the past; however, he refuses to wear this.  He also has a history of hypertension and has been on multiple medications.  He has severe diastolic dysfunction as noted above.  He has diabetes mellitus.  He underwent cardiac catheterization that did not reveal any significant flow-limiting lesions.  There is a question of atrial fibrillation; however, this has not been documented.  He has asthma and a history of bronchospasm.  HOSPITAL COURSE:  As noted, this patient was admitted to [facility] with congestive heart failure due to diastolic  dysfunction.  He was diuresed and followed carefully.  His potassium was noted to be low and was supplemented during his stay.  He reportedly had some bradycardia with a heart rate in the 30s and 40s; however, this was not documented by telemetry. A pulmonary consult was obtained from Dr. Sung Amabile during the patients stay. It was recommended that the patient stay on continuous home oxygen therapy at 2 liters/minute as well as BiPAP or CPAP which had previously been prescribed. Apparently the patient is still smoking and smoking cessation was recommended as well as nebulized bronchodilators.  The patient is to follow up with Dr. Shelle Iron on an outpatient basis.  The patient was diuresed with Demadex.  His Normodyne was decreased secondary to his bradycardia.  He was noted to have some ventricular ectopy on telemetry; however, this may have been secondary to low potassium levels. Arrangements were eventually made to discharge the patient on November 24, 2000 in improved condition.  Potassium on the day of discharge was 4.0.  LABORATORY DATA:  A BMET on the day of discharge revealed a BUN of 37, creatinine 1.8, potassium 4.0.  On the day prior to this, potassium was 3.4. ABGs performed on November 4 on room air revealed a pH 7.34, PCO2 70, PO2 49, bicarb 37, total CO2 39, O2 saturation 80%.  A CBC on admission revealed hemoglobin 11.3; hematocrit 33.6;  wbcs 5.1; platelets 166,000.  A d-dimer was elevated at 0.94.  Cardiac enzymes were negative.  A beta natriuretic peptide was 173, which was elevated.  A CT scan of the chest was negative for pulmonary emboli.  There were patchy areas of subpleural atelectasis but no evidence of pulmonary emboli.  A chest x-ray on November 1 showed marked cardiomegaly with pulmonary vascular congestion.  DISCHARGE MEDICATIONS:  1. Lactulose 30 mg b.i.d.  2. Mevacor 20 mg at h.s.  3. Glucophage 1000 mg b.i.d.  4. Combivent metered dose inhaler two puffs  q.i.d. p.r.n.  5. Diovan 325 mg daily.  6. Actos 40 mg daily.  7. Norvasc 10 mg daily.  8. Enteric-coated aspirin 325 mg daily.  9. Lasix 120 mg b.i.d. 10. Vioxx 25 mg daily. 11. Normodyne 300 mg b.i.d. 12. Minoxidil 10 mg daily. 13. K-Dur 20 mEq daily. 14. Xanax 0.25 mg one-half to one tablet q.8h. as needed # 30 no refill - the     patient requested a prescription for an anxiety medication.  DIET:  The patient was to be on a low salt/low fat diet.  SPECIAL INSTRUCTIONS:  He was to have a potassium level at his next office visit.  He was to check his weight daily.  He was to record this and bring it to his next appointment.  He was told to quit smoking.  FOLLOW-UP:  He was to see Dr. Everardo All as needed or as scheduled; Dr. Andee Lineman on November 30, 2000 and on January 10, 2001; Dr. Shelle Iron on December 26, 2000.  PROBLEM LIST AT TIME OF DISCHARGE:  1. Congestive heart failure secondary to diastolic dysfunction.  2. Cardiac catheterization November 02, 1999 revealing no significant     coronary artery disease.  3. Noncompliance.  4. Obstructive sleep apnea.  5. Diabetes mellitus.  6. Hypertension.  7. Questionable history of atrial fibrillation - no documentation.  8. Bradycardia this admission.  9. History of bronchospasm/asthma. 10. Mildly elevated BUN levels. 11. Mild anemia. 12. Hypokalemia - treated and resolved. 13. Mild ventricular ectopy. Dictated by:   Delton See, P.A. Attending Physician:  Learta Codding DD:  11/24/00 TD:  11/24/00 Job: [id number]

## 2010-06-06 ENCOUNTER — Encounter: Payer: Medicare Other | Admitting: *Deleted

## 2010-06-23 ENCOUNTER — Other Ambulatory Visit (INDEPENDENT_AMBULATORY_CARE_PROVIDER_SITE_OTHER): Payer: Medicare Other

## 2010-06-23 ENCOUNTER — Ambulatory Visit (INDEPENDENT_AMBULATORY_CARE_PROVIDER_SITE_OTHER): Payer: Medicare Other | Admitting: Endocrinology

## 2010-06-23 ENCOUNTER — Encounter: Payer: Self-pay | Admitting: Endocrinology

## 2010-06-23 DIAGNOSIS — E119 Type 2 diabetes mellitus without complications: Secondary | ICD-10-CM

## 2010-06-23 DIAGNOSIS — D649 Anemia, unspecified: Secondary | ICD-10-CM

## 2010-06-23 DIAGNOSIS — Z79899 Other long term (current) drug therapy: Secondary | ICD-10-CM

## 2010-06-23 LAB — CBC WITH DIFFERENTIAL/PLATELET
Basophils Relative: 0.2 % (ref 0.0–3.0)
Eosinophils Absolute: 0 10*3/uL (ref 0.0–0.7)
Hemoglobin: 12.7 g/dL — ABNORMAL LOW (ref 13.0–17.0)
MCHC: 34.1 g/dL (ref 30.0–36.0)
MCV: 88.1 fl (ref 78.0–100.0)
Monocytes Absolute: 0.4 10*3/uL (ref 0.1–1.0)
Neutro Abs: 3.7 10*3/uL (ref 1.4–7.7)
Neutrophils Relative %: 76.9 % (ref 43.0–77.0)
RBC: 4.25 Mil/uL (ref 4.22–5.81)

## 2010-06-23 MED ORDER — CLONIDINE HCL 0.2 MG PO TABS: 0.2000 mg | ORAL_TABLET | Freq: Three times a day (TID) | ORAL | Status: DC

## 2010-06-23 MED ORDER — CITALOPRAM HYDROBROMIDE 40 MG PO TABS: 40.0000 mg | ORAL_TABLET | Freq: Every day | ORAL | Status: DC

## 2010-06-23 MED ORDER — ALISKIREN FUMARATE 150 MG PO TABS: ORAL_TABLET | ORAL | Status: DC

## 2010-06-23 MED ORDER — ALPRAZOLAM 2 MG PO TABS: 2.0000 mg | ORAL_TABLET | Freq: Every evening | ORAL | Status: DC | PRN

## 2010-06-23 MED ORDER — OXYCODONE-ACETAMINOPHEN 10-325 MG PO TABS: 1.0000 | ORAL_TABLET | Freq: Four times a day (QID) | ORAL | Status: DC | PRN

## 2010-06-23 NOTE — Patient Instructions (Addendum)
Reduce tekturna to 75 mg daily. blood tests are being ordered for you today.  please call [phone] to hear your test results.  You will be prompted to enter the 9-digit "MRN" number that appears at the top left of this page, followed by #.  Then you will hear the message. Please make a regular physical appointment for late September. Here is a prescription for  stronger pain medication.  Call if this helps, so we can refill it. (update: i left message on phone-tree:  increase lantus to 50 units qam).

## 2010-06-23 NOTE — Progress Notes (Signed)
Subjective:    Patient ID: Travis Chapman, male    DOB: Oct 20, 1945, 65 y.o.   MRN: [id number]  HPI The state of at least three ongoing medical problems is addressed today: The safety of "tekturna" has been called into question.  Chronic sob is unchanged. Dm: no cbg record, but states cbg's are well-controlled Dslipidemia: denies myalgias. Chronic bilat shoulder pain: he says vicodin no longer helps. Past Medical History  Diagnosis Date  . ANXIETY 07/23/2006  . ASTHMA 07/23/2006  . DM 05/23/2007  . HYPERTENSION 07/23/2006  . OSTEOARTHRITIS 07/23/2006  . CARCINOMA, PROSTATE 09/29/2009  . HYPERCHOLESTEROLEMIA 05/23/2007  . ANEMIA 08/31/2008  . CARDIOMYOPATHY 05/23/2007  . Palpitations 02/10/2008  . ALCOHOL ABUSE, IN REMISSION, HX OF 08/26/2008  . TOBACCO ABUSE, HX OF 08/26/2008  . Pulmonary embolism 03/16/2010  . ED (erectile dysfunction)   . Morbid obesity   . DM retinopathy   . 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 DM neuropathy with neurologic complication   . CHF (congestive heart failure)   . 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  History   Social History  . Marital Status: Divorced    Spouse Name: N/A    Number of Children: N/A  . Years of Education: N/A   Occupational History  . Disabled    Social History Main Topics  . Smoking status: Former Games developer  . Smokeless tobacco: Not on file  . Alcohol Use: Not on file  . Drug Use: Not on file  . Sexually Active: Not on file   Other Topics Concern  . Not on file   Social History Narrative   SingleLives with his elderly mother    Current Outpatient Prescriptions on File Prior to Visit  Medication Sig Dispense Refill  . amLODipine (NORVASC) 10 MG tablet Take 10 mg by mouth daily.        . furosemide (LASIX) 20 MG tablet Take 20 mg by mouth daily.        Marland Kitchen glucose blood test strip Use as directed three times a day        . hydrochlorothiazide 25 MG tablet Take 25 mg by mouth daily.        . insulin glargine (LANTUS) 100 UNIT/ML injection Inject 50 Units into the skin every morning.       . Insulin Syringe-Needle U-100 (RELION INSULIN SYR 0.5CC/30G) 30G X 5/16" 0.5 ML MISC Use as directed       . KLOR-CON M20 20 MEQ tablet TAKE TWO TABLETS BY MOUTH EVERY DAY  60 each  3  . metoCLOPramide (REGLAN) 5 MG tablet Take 5 mg by mouth 3 (three) times daily.        . Multiple Vitamin (MULTIVITAMIN) tablet Take 1 tablet by mouth daily.        Marland Kitchen olmesartan (BENICAR) 40 MG tablet Take 40 mg by mouth daily.        . simvastatin (ZOCOR) 20 MG tablet Take 20 mg by mouth at bedtime.        Marland Kitchen warfarin (COUMADIN) 10 MG tablet TAKE AS DIRECTED BY ANTICOAGULATION CLINIC  40 tablet  0    No Known Allergies  Family History  Problem Relation Age of Onset  . Cancer Neg Hx     BP 132/84  Pulse 98  Temp(Src) 98.9 F (37.2 C) (Oral)  Ht 6\' 4"  (1.93 m)  Wt 391 lb 12.8 oz (177.719 kg)  BMI 47.69 kg/m2  SpO2 91% Review of Systems denies hypoglycemia, brbpr and hematuria.    Objective:   Physical Exam Pulses:  dorsalis pedis intact bilat.  no carotid bruit Extremities:  no deformity.  no ulcer on the feet.  feet are of normal color and temp.   skin on the feet is very dry.  toenails are very long.  1+ bilat leg edema.   Neurologic:  sensation is intact to touch on the feet Lab Results  Component Value Date   HGBA1C 9.0* 06/23/2010      Assessment & Plan:  Chronic anemia, w/u neg, stable Dm, therapy limited by noncompliance with cbg recording.  i'll do the best i can.  He needs increased rx Chronic bilat shoulder pain, needs increased rx. Htn.  The safety of tekturna in the setting of dm has been called into question. Dyslipidemia.  He tolerates med well.

## 2010-06-29 ENCOUNTER — Telehealth: Payer: Self-pay | Admitting: *Deleted

## 2010-06-29 NOTE — Telephone Encounter (Signed)
Message copied by Carin Primrose on Wed Jun 29, 2010 11:29 AM ------      Message from: Ian Malkin      Created: Tue Jun 28, 2010  4:01 PM                   ----- Message -----         From: Minus Breeding, MD         Sent: 06/26/2010   6:01 PM           To: Jocelyn Lamer            please call patient:      A1c=9.0.  Increase lantus to 50 units qam

## 2010-06-29 NOTE — Telephone Encounter (Signed)
Pt's daughter, Velma Hanna,  informed of lab results and of MD's advisement of increased dosage of insulin.

## 2010-07-04 ENCOUNTER — Other Ambulatory Visit: Payer: Self-pay | Admitting: Endocrinology

## 2010-07-29 ENCOUNTER — Other Ambulatory Visit: Payer: Self-pay

## 2010-07-29 MED ORDER — OXYCODONE-ACETAMINOPHEN 10-325 MG PO TABS: 1.0000 | ORAL_TABLET | Freq: Four times a day (QID) | ORAL | Status: DC | PRN

## 2010-07-29 NOTE — Telephone Encounter (Signed)
Left message on machine for pt to return my call  

## 2010-08-30 ENCOUNTER — Other Ambulatory Visit: Payer: Self-pay | Admitting: Endocrinology

## 2010-08-31 ENCOUNTER — Other Ambulatory Visit: Payer: Self-pay

## 2010-08-31 MED ORDER — OXYCODONE-ACETAMINOPHEN 10-325 MG PO TABS: 1.0000 | ORAL_TABLET | Freq: Four times a day (QID) | ORAL | Status: DC | PRN

## 2010-09-01 NOTE — Telephone Encounter (Signed)
Pt's spouse advised 

## 2010-09-01 NOTE — Telephone Encounter (Signed)
No answer, no VM

## 2010-09-26 ENCOUNTER — Other Ambulatory Visit: Payer: Self-pay

## 2010-09-26 MED ORDER — OXYCODONE-ACETAMINOPHEN 10-325 MG PO TABS: 1.0000 | ORAL_TABLET | Freq: Four times a day (QID) | ORAL | Status: DC | PRN

## 2010-09-26 NOTE — Telephone Encounter (Signed)
Pt informed, rx placed upfront in cabinet ready for pickup. 

## 2010-09-28 ENCOUNTER — Other Ambulatory Visit: Payer: Self-pay | Admitting: Endocrinology

## 2010-09-29 ENCOUNTER — Other Ambulatory Visit: Payer: Self-pay | Admitting: Endocrinology

## 2010-10-19 ENCOUNTER — Other Ambulatory Visit: Payer: Self-pay | Admitting: Endocrinology

## 2010-10-21 ENCOUNTER — Telehealth: Payer: Self-pay | Admitting: *Deleted

## 2010-10-21 DIAGNOSIS — Z0389 Encounter for observation for other suspected diseases and conditions ruled out: Secondary | ICD-10-CM

## 2010-10-21 DIAGNOSIS — Z0000 Encounter for general adult medical examination without abnormal findings: Secondary | ICD-10-CM

## 2010-10-21 DIAGNOSIS — E119 Type 2 diabetes mellitus without complications: Secondary | ICD-10-CM

## 2010-10-21 NOTE — Telephone Encounter (Signed)
Upcoming CPX labs placed into Epic.

## 2010-10-21 NOTE — Telephone Encounter (Signed)
Message copied by Carin Primrose on Fri Oct 21, 2010  3:38 PM ------      Message from: Newell Coral      Created: Fri Oct 21, 2010  1:29 PM      Regarding: physical scheduled - needs labs       This pt scheduled his physical for the end of October and is hoping to get labs done.             Thanks so much!

## 2010-10-26 ENCOUNTER — Other Ambulatory Visit: Payer: Self-pay | Admitting: Endocrinology

## 2010-11-15 ENCOUNTER — Encounter: Payer: Self-pay | Admitting: Endocrinology

## 2010-11-15 ENCOUNTER — Ambulatory Visit (INDEPENDENT_AMBULATORY_CARE_PROVIDER_SITE_OTHER): Payer: Medicare Other | Admitting: Endocrinology

## 2010-11-15 ENCOUNTER — Other Ambulatory Visit (INDEPENDENT_AMBULATORY_CARE_PROVIDER_SITE_OTHER): Payer: Medicare Other

## 2010-11-15 VITALS — BP 168/88 | HR 94 | Temp 99.0°F | Resp 16 | Ht 73.0 in | Wt 383.0 lb

## 2010-11-15 DIAGNOSIS — Z23 Encounter for immunization: Secondary | ICD-10-CM

## 2010-11-15 DIAGNOSIS — Z0389 Encounter for observation for other suspected diseases and conditions ruled out: Secondary | ICD-10-CM

## 2010-11-15 DIAGNOSIS — Z125 Encounter for screening for malignant neoplasm of prostate: Secondary | ICD-10-CM

## 2010-11-15 DIAGNOSIS — I428 Other cardiomyopathies: Secondary | ICD-10-CM

## 2010-11-15 DIAGNOSIS — Z136 Encounter for screening for cardiovascular disorders: Secondary | ICD-10-CM

## 2010-11-15 DIAGNOSIS — E119 Type 2 diabetes mellitus without complications: Secondary | ICD-10-CM

## 2010-11-15 DIAGNOSIS — Z0000 Encounter for general adult medical examination without abnormal findings: Secondary | ICD-10-CM

## 2010-11-15 LAB — URINALYSIS, ROUTINE W REFLEX MICROSCOPIC
Bilirubin Urine: NEGATIVE
Hgb urine dipstick: NEGATIVE
Ketones, ur: NEGATIVE
Leukocytes, UA: NEGATIVE
Specific Gravity, Urine: 1.02 (ref 1.000–1.030)
Urine Glucose: NEGATIVE
Urobilinogen, UA: 1 (ref 0.0–1.0)

## 2010-11-15 LAB — CBC WITH DIFFERENTIAL/PLATELET
Basophils Absolute: 0 10*3/uL (ref 0.0–0.1)
Basophils Relative: 0.3 % (ref 0.0–3.0)
Eosinophils Absolute: 0.1 10*3/uL (ref 0.0–0.7)
Hemoglobin: 13.1 g/dL (ref 13.0–17.0)
MCHC: 32.7 g/dL (ref 30.0–36.0)
MCV: 88.8 fl (ref 78.0–100.0)
Monocytes Absolute: 0.3 10*3/uL (ref 0.1–1.0)
Neutro Abs: 5.1 10*3/uL (ref 1.4–7.7)
Neutrophils Relative %: 81.7 % — ABNORMAL HIGH (ref 43.0–77.0)
RBC: 4.5 Mil/uL (ref 4.22–5.81)
RDW: 14 % (ref 11.5–14.6)

## 2010-11-15 LAB — LIPID PANEL
HDL: 43.1 mg/dL (ref >39.00)
LDL Cholesterol: 84 mg/dL (ref 0–99)
Total CHOL/HDL Ratio: 3
VLDL: 16.4 mg/dL (ref 0.0–40.0)

## 2010-11-15 LAB — TSH: TSH: 1.08 u[IU]/mL (ref 0.35–5.50)

## 2010-11-15 LAB — HEPATIC FUNCTION PANEL
AST: 15 U/L (ref 0–37)
Albumin: 3.7 g/dL (ref 3.5–5.2)
Alkaline Phosphatase: 75 U/L (ref 39–117)
Bilirubin, Direct: 0.1 mg/dL (ref 0.0–0.3)

## 2010-11-15 LAB — BASIC METABOLIC PANEL
GFR: 81.33 mL/min (ref >60.00)
Glucose, Bld: 159 mg/dL — ABNORMAL HIGH (ref 70–99)
Potassium: 3.8 mEq/L (ref 3.5–5.1)
Sodium: 137 mEq/L (ref 135–145)

## 2010-11-15 LAB — PSA: PSA: 0.3 ng/mL (ref 0.10–4.00)

## 2010-11-15 MED ORDER — FUROSEMIDE 40 MG PO TABS: 40.0000 mg | ORAL_TABLET | Freq: Two times a day (BID) | ORAL | Status: DC

## 2010-11-15 MED ORDER — OXYCODONE HCL 15 MG PO TABS: 15.0000 mg | ORAL_TABLET | ORAL | Status: DC | PRN

## 2010-11-15 NOTE — Progress Notes (Signed)
Subjective:    Patient ID: Travis Chapman, male    DOB: [birth year], 65 y.o.   MRN: [id number]  HPI here for regular wellness examination.  He's feeling pretty well in general, and says chronic med probs are stable, except as noted below Past Medical History  Diagnosis Date  . ANXIETY 07/23/2006  . ASTHMA 07/23/2006  . DM 05/23/2007  . HYPERTENSION 07/23/2006  . OSTEOARTHRITIS 07/23/2006  . CARCINOMA, PROSTATE 09/29/2009  . HYPERCHOLESTEROLEMIA 05/23/2007  . ANEMIA 08/31/2008  . CARDIOMYOPATHY 05/23/2007  . Palpitations 02/10/2008  . ALCOHOL ABUSE, IN REMISSION, HX OF 08/26/2008  . TOBACCO ABUSE, HX OF 08/26/2008  . Pulmonary embolism 03/16/2010  . ED (erectile dysfunction)   . Morbid obesity   . DM retinopathy   . 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 DM neuropathy with neurologic complication   . CHF (congestive heart failure)   . 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  History   Social History  . Marital Status: Divorced    Spouse Name: N/A    Number of Children: N/A  . Years of Education: N/A   Occupational History  . Disabled    Social History Main Topics  . Smoking status: Former Games developer  . Smokeless tobacco: Not on file  . Alcohol Use: Not on file  . Drug Use: Not on file  . Sexually Active: Not on file   Other Topics Concern  . Not on file   Social History Narrative   SingleLives with his elderly mother    Current Outpatient Prescriptions on File Prior to Visit  Medication Sig Dispense Refill  . alprazolam (XANAX) 2 MG tablet Take 1 tablet (2 mg total) by mouth at bedtime as needed for sleep.  30 tablet  5  . amLODipine (NORVASC) 10 MG tablet TAKE ONE TABLET BY MOUTH EVERY DAY  30 tablet  3  . BENICAR 40 MG tablet TAKE ONE TABLET BY MOUTH EVERY DAY  90 each  0  . citalopram (CELEXA) 40 MG tablet Take 1 tablet (40 mg total) by mouth daily.  30 tablet  11    . cloNIDine (CATAPRES) 0.2 MG tablet Take 1 tablet (0.2 mg total) by mouth 3 (three) times daily.  90 tablet  11  . glucose blood test strip Use as directed three times a day       . hydrochlorothiazide 25 MG tablet TAKE ONE TABLET BY MOUTH EVERY DAY  30 tablet  4  . insulin glargine (LANTUS) 100 UNIT/ML injection Inject 50 Units into the skin every morning.  20 mL  3  . Insulin Syringe-Needle U-100 (RELION INSULIN SYR 0.5CC/30G) 30G X 5/16" 0.5 ML MISC Use as directed       . KLOR-CON M20 20 MEQ tablet TAKE TWO TABLETS BY MOUTH EVERY DAY  60 each  2  . metoCLOPramide (REGLAN) 5 MG tablet TAKE ONE TABLET BY MOUTH THREE TIMES DAILY  90 tablet  3  . Multiple Vitamin (MULTIVITAMIN) tablet Take 1 tablet by mouth daily.        . simvastatin (ZOCOR) 20 MG tablet Take 20 mg by mouth at bedtime.        Marland Kitchen warfarin (COUMADIN) 10 MG tablet TAKE AS DIRECTED BY  ANTICOAGULATION  CLINIC  40 tablet  1    No Known Allergies  Family History  Problem Relation Age of Onset  . Cancer Neg Hx     BP 168/88  Pulse 94  Temp(Src) 99 F (37.2 C) (Oral)  Resp 16  Ht 6\' 1"  (1.854 m)  Wt 383 lb (173.728 kg)  BMI 50.53 kg/m2  SpO2 90%     Review of Systems  Constitutional: Negative for fever and diaphoresis.  HENT: Negative for hearing loss.   Eyes: Negative for visual disturbance.  Respiratory: Negative for cough.   Cardiovascular: Negative for chest pain.  Gastrointestinal: Negative for anal bleeding.  Genitourinary: Negative for hematuria.  Musculoskeletal: Negative for back pain.  Skin: Negative for rash.  Neurological: Negative for syncope and numbness.  Hematological: Does not bruise/bleed easily.  Psychiatric/Behavioral:       ? Of depression       Objective:   Physical Exam VS: see vs page GEN: no distress HEAD: head: no deformity eyes: no periorbital swelling, no proptosis. external nose and ears are normal. mouth: no lesion seen. NECK: supple, thyroid exam is not possible due  to tracheostomy. CHEST WALL: no deformity. LUNGS:  Clear to auscultation. BREASTS:  There is bilat pseudogynecomastia.  CV: reg rate and rhythm, no murmur ABD: abdomen is soft, nontender.  no hepatosplenomegaly.  not distended.  no hernia GENITALIA:  Normal external male.  Normal bimanual exam RECTAL: unable to do due to severe obesity MUSCULOSKELETAL: muscle bulk and strength are grossly normal.  no obvious joint swelling.  gait is normal and steady PULSES: dorsalis pedis intact bilat.  no carotid bruit. NEURO:  cn 2-12 grossly intact.   readily moves all 4's.  sensation is intact to touch on the feet. SKIN:  Normal texture and temperature.  No rash or suspicious lesion is visible.   NODES:  None palpable at the neck. PSYCH: alert, oriented x3.  Does not appear anxious nor depressed.     Assessment & Plan:  Wellness visit today, with problems stable, except as noted.    SEPARATE EVALUATION FOLLOWS--EACH PROBLEM HERE IS NEW, NOT RESPONDING TO TREATMENT, OR POSES SIGNIFICANT RISK TO THE PATIENT'S HEALTH: HISTORY OF THE PRESENT ILLNESS: He ran out of coumadin 1 week ago.   The safety of tekturna has been called into question, especially in patients who also take arb.  He says he inconsistently takes his meds.  No change in chronic sob He has few years of severe pain at the knees.  He is not a surgical candidate. PAST MEDICAL HISTORY reviewed and up to date today REVIEW OF SYSTEMS: denies hypoglycemia and weight gain PHYSICAL EXAMINATION: VITAL SIGNS:  See vs page GENERAL: no distress EXTEMITIES: no deformity.  no ulcer on the feet.  feet are of normal color and temp.  1+ bilat leg edema.  There is severely dry skin at both feet.  There is bilateral onychomycosis. LAB/XRAY RESULTS: Lab Results  Component Value Date   HGBA1C 7.5* 11/15/2010  IMPRESSION: PE, therapy limited by noncompliance.  i'll do the best i can. Dm, therapy limited by pt's need for a simple regimen Htn, he  needs some adjustment in his therapy Knee pain, chronic.  Therapy is limited by inability to safely undergo surgery. PLAN: See instruction page

## 2010-11-15 NOTE — Patient Instructions (Addendum)
Please go back to see dr hochrein.  you will receive a phone call, about a day and time for an appointment. blood tests are being requested for you today.  please call [phone] to hear your test results.  You will be prompted to enter the 9-digit "MRN" number that appears at the top left of this page, followed by #.  Then you will hear the message. Stop tekturna Increase furosemide to 40 mg daily. Here is a prescription for a stronger pain medication, on a trial basis. please consider these measures for your health:  minimize alcohol.  do not use tobacco products.  have a colonoscopy at least every 10 years from age 89.  keep firearms safely stored.  always use seat belts.  have working smoke alarms in your home.  see an eye doctor and dentist regularly.  never drive under the influence of alcohol or drugs (including prescription drugs).  please let me know what your wishes would be, if artificial life support measures should become necessary.  it is critically important to prevent falling down (keep floor areas well-lit, dry, and free of loose objects.  If you have a cane, walker, or wheelchair, you should use it, even for short trips around the house.  Also, try not to rush).  Please come back for a follow-up appointment for 1 month.  Please make an appointment. (update: we discussed code status.  pt requests full code, but would not want to be started or maintained on artificial life-support measures if there was not a reasonable chance of recovery).

## 2010-11-16 LAB — MICROALBUMIN / CREATININE URINE RATIO
Creatinine,U: 118.8 mg/dL
Microalb Creat Ratio: 98.4 mg/g — ABNORMAL HIGH (ref 0.0–30.0)
Microalb, Ur: 116.9 mg/dL — ABNORMAL HIGH (ref 0.0–1.9)

## 2010-11-16 LAB — LDL CHOLESTEROL, DIRECT: Direct LDL: 87.2 mg/dL

## 2010-11-17 ENCOUNTER — Institutional Professional Consult (permissible substitution): Payer: Medicare Other | Admitting: Cardiology

## 2010-11-28 ENCOUNTER — Institutional Professional Consult (permissible substitution): Payer: Medicare Other | Admitting: Cardiology

## 2010-11-29 ENCOUNTER — Other Ambulatory Visit: Payer: Self-pay | Admitting: Endocrinology

## 2010-12-02 ENCOUNTER — Other Ambulatory Visit: Payer: Self-pay

## 2010-12-02 MED ORDER — FUROSEMIDE 40 MG PO TABS: 40.0000 mg | ORAL_TABLET | Freq: Two times a day (BID) | ORAL | Status: DC

## 2010-12-02 NOTE — Telephone Encounter (Signed)
Pt called requesting updated quantity for Lasix 40 bid # 30, should be # 60. Rx resent per pt request.

## 2010-12-09 ENCOUNTER — Ambulatory Visit (INDEPENDENT_AMBULATORY_CARE_PROVIDER_SITE_OTHER): Payer: Medicare Other | Admitting: *Deleted

## 2010-12-09 DIAGNOSIS — I2699 Other pulmonary embolism without acute cor pulmonale: Secondary | ICD-10-CM

## 2010-12-09 LAB — POCT INR: INR: 1.2

## 2010-12-09 MED ORDER — WARFARIN SODIUM 10 MG PO TABS: 10.0000 mg | ORAL_TABLET | ORAL | Status: DC

## 2010-12-16 ENCOUNTER — Encounter: Payer: Medicare Other | Admitting: *Deleted

## 2010-12-19 ENCOUNTER — Encounter: Payer: Medicare Other | Admitting: *Deleted

## 2010-12-21 ENCOUNTER — Other Ambulatory Visit: Payer: Self-pay | Admitting: Endocrinology

## 2010-12-22 NOTE — Telephone Encounter (Signed)
Rx faxed to [facility].  

## 2010-12-22 NOTE — Telephone Encounter (Signed)
i printed Ov is due 

## 2010-12-26 ENCOUNTER — Other Ambulatory Visit: Payer: Self-pay

## 2010-12-26 MED ORDER — OXYCODONE HCL 15 MG PO TABS: 15.0000 mg | ORAL_TABLET | ORAL | Status: AC | PRN

## 2010-12-26 NOTE — Telephone Encounter (Signed)
Pt informed rx ready for pickup, rx placed upfront in cabinet.

## 2011-01-05 ENCOUNTER — Telehealth: Payer: Self-pay

## 2011-01-05 NOTE — Telephone Encounter (Signed)
Pt called stating he fell yesterday and now has some stiffness in his back. Pt is requesting Rx for muscle relaxer. Please advise in Dr George Hugh absence. Thanks!

## 2011-01-05 NOTE — Telephone Encounter (Signed)
Use Ibuprofen or tylenol and heat after 2 days OV if problems Thx

## 2011-01-06 NOTE — Telephone Encounter (Signed)
Pt's spouse advise and understood

## 2011-01-24 ENCOUNTER — Other Ambulatory Visit: Payer: Self-pay | Admitting: Endocrinology

## 2011-01-25 ENCOUNTER — Telehealth: Payer: Self-pay | Admitting: *Deleted

## 2011-01-25 NOTE — Telephone Encounter (Signed)
R'cd call from [facility] regarding rx for Reglan. 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

## 2011-01-26 NOTE — Telephone Encounter (Signed)
Ov due.  We'll address then

## 2011-01-27 NOTE — Telephone Encounter (Signed)
Can you call this pt to schedule an appointment for Dr. Everardo All, Thanks.

## 2011-02-01 ENCOUNTER — Ambulatory Visit (INDEPENDENT_AMBULATORY_CARE_PROVIDER_SITE_OTHER): Payer: Medicare Other | Admitting: Endocrinology

## 2011-02-01 ENCOUNTER — Encounter: Payer: Self-pay | Admitting: Endocrinology

## 2011-02-01 ENCOUNTER — Other Ambulatory Visit: Payer: Self-pay | Admitting: Endocrinology

## 2011-02-01 VITALS — BP 134/84 | HR 76 | Temp 98.6°F | Ht 73.0 in | Wt 375.0 lb

## 2011-02-01 DIAGNOSIS — E119 Type 2 diabetes mellitus without complications: Secondary | ICD-10-CM

## 2011-02-01 DIAGNOSIS — I428 Other cardiomyopathies: Secondary | ICD-10-CM

## 2011-02-01 MED ORDER — OXYCODONE HCL 15 MG PO TABS: 15.0000 mg | ORAL_TABLET | ORAL | Status: AC | PRN

## 2011-02-01 MED ORDER — ALPRAZOLAM ER 3 MG PO TB24: 3.0000 mg | ORAL_TABLET | ORAL | Status: AC

## 2011-02-01 NOTE — Progress Notes (Signed)
Subjective:    Patient ID: Travis Chapman, male    DOB: [birth year], 66 y.o.   MRN: [id number]  HPI Xanax doe not help sleep.  He is on a ventilator at night. The oxy-ir medication works well for his knee pain.  He received 2 local injections, but they did not help.   No change in chronic sob.  He missed his appt with dr hochrein. He says his diet is poor. Past Medical History  Diagnosis Date  . ANXIETY 07/23/2006  . ASTHMA 07/23/2006  . DM 05/23/2007  . HYPERTENSION 07/23/2006  . OSTEOARTHRITIS 07/23/2006  . CARCINOMA, PROSTATE 09/29/2009  . HYPERCHOLESTEROLEMIA 05/23/2007  . ANEMIA 08/31/2008  . CARDIOMYOPATHY 05/23/2007  . Palpitations 02/10/2008  . ALCOHOL ABUSE, IN REMISSION, HX OF 08/26/2008  . TOBACCO ABUSE, HX OF 08/26/2008  . Pulmonary embolism 03/16/2010  . ED (erectile dysfunction)   . Morbid obesity   . DM retinopathy   . 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 DM neuropathy with neurologic complication   . CHF (congestive heart failure)   . 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  History   Social History  . Marital Status: Divorced    Spouse Name: N/A    Number of Children: N/A  . Years of Education: N/A   Occupational History  . Disabled    Social History Main Topics  . Smoking status: Former Smoker    Quit date: 01/16/2005  . Smokeless tobacco: Not on file  . Alcohol Use: No  . Drug Use: Not on file  . Sexually Active: Not on file   Other Topics Concern  . Not on file   Social History Narrative   SingleLives with his elderly mother    Current Outpatient Prescriptions on File Prior to Visit  Medication Sig Dispense Refill  . BENICAR 40 MG tablet TAKE ONE TABLET BY MOUTH EVERY DAY  90 each  0  . citalopram (CELEXA) 40 MG tablet Take 1 tablet (40 mg total) by mouth daily.  30 tablet  11  . cloNIDine (CATAPRES) 0.2 MG tablet Take 1 tablet (0.2 mg  total) by mouth 3 (three) times daily.  90 tablet  11  . furosemide (LASIX) 40 MG tablet Take 1 tablet (40 mg total) by mouth 2 (two) times daily.  60 tablet  11  . glucose blood test strip Use as directed three times a day       . hydrochlorothiazide 25 MG tablet TAKE ONE TABLET BY MOUTH EVERY DAY  30 tablet  4  . insulin glargine (LANTUS) 100 UNIT/ML injection Inject 50 Units into the skin every morning.  20 mL  3  . Insulin Syringe-Needle U-100 (RELION INSULIN SYR 0.5CC/30G) 30G X 5/16" 0.5 ML MISC Use as directed       . KLOR-CON M20 20 MEQ tablet TAKE TWO TABLETS BY MOUTH EVERY DAY  60 each  2  . metoCLOPramide (REGLAN) 5 MG tablet TAKE ONE TABLET BY MOUTH THREE TIMES DAILY  90 tablet  2  . Multiple Vitamin (MULTIVITAMIN) tablet Take 1 tablet by mouth daily.        . simvastatin (ZOCOR) 20 MG tablet TAKE ONE TABLET BY MOUTH AT BEDTIME  30 tablet  5  . warfarin (COUMADIN) 10 MG tablet Take 1 tablet (10 mg total) by mouth as directed.  40 tablet  1    No Known Allergies  Family History  Problem Relation Age of Onset  . Cancer Neg Hx     BP 134/84  Pulse 76  Temp(Src) 98.6 F (37 C) (Oral)  Ht 6\' 1"  (1.854 m)  Wt 375 lb (170.099 kg)  BMI 49.48 kg/m2  SpO2 96%  Review of Systems he has anxiety.  He has weight gain.      Objective:   Physical Exam VITAL SIGNS:  See vs page GENERAL: no distress.  Morbid obesity.  In wheelchair Ext: 1+ bilat leg edema   Lab Results  Component Value Date   HGBA1C 7.5* 11/15/2010      Assessment & Plan:  CHF.  therapy limited by noncompliance with cardiol f/u.  i'll do the best i can. Insomnia, needs increased rx Hypovetilation.  His cpap mask gives him some protection against this, even with pain and sleep meds oa of knees.  i'll weight the risks and benefits of narcotics as best i can.   DM, this is the best control this pt should aim for, given this regimen, which does match insulin to his changing needs throughout the day

## 2011-02-01 NOTE — Patient Instructions (Addendum)
Increase alproazolam to 3 mg at bedtime, as needed for sleep.  Here is a prescription.   blood tests are being requested for you today.  please call [phone] to hear your test results.  You will be prompted to enter the 9-digit "MRN" number that appears at the top left of this page, followed by #.  Then you will hear the message. Here are 2 prescriptions for your pain medication.   Please come back for a follow-up appointment in 3 months. i have requested another appointment with dr hochrein.   Also refer to a dietician specialist.  you will receive 2 phone calls, about days and times for appointments.   (update: i left message on phone-tree:  rx as we discussed)

## 2011-02-11 ENCOUNTER — Other Ambulatory Visit: Payer: Self-pay | Admitting: Endocrinology

## 2011-02-21 ENCOUNTER — Other Ambulatory Visit: Payer: Self-pay | Admitting: Endocrinology

## 2011-02-21 NOTE — Telephone Encounter (Signed)
Faxed script back to [facility]... 02/21/11@4 :18pm/LMB

## 2011-02-21 NOTE — Telephone Encounter (Signed)
i printed 

## 2011-02-23 ENCOUNTER — Institutional Professional Consult (permissible substitution): Payer: Medicare Other | Admitting: Cardiology

## 2011-02-28 ENCOUNTER — Encounter: Payer: Self-pay | Admitting: Cardiology

## 2011-02-28 ENCOUNTER — Ambulatory Visit (INDEPENDENT_AMBULATORY_CARE_PROVIDER_SITE_OTHER): Payer: Medicare Other | Admitting: Cardiology

## 2011-02-28 DIAGNOSIS — I10 Essential (primary) hypertension: Secondary | ICD-10-CM

## 2011-02-28 DIAGNOSIS — I5030 Unspecified diastolic (congestive) heart failure: Secondary | ICD-10-CM

## 2011-02-28 DIAGNOSIS — I5032 Chronic diastolic (congestive) heart failure: Secondary | ICD-10-CM | POA: Insufficient documentation

## 2011-02-28 DIAGNOSIS — I428 Other cardiomyopathies: Secondary | ICD-10-CM

## 2011-02-28 DIAGNOSIS — R635 Abnormal weight gain: Secondary | ICD-10-CM

## 2011-02-28 NOTE — Patient Instructions (Signed)
Your physician recommends that you schedule a follow-up appointment in: AS NEEDED  

## 2011-02-28 NOTE — Assessment & Plan Note (Signed)
Obesity is certainly his most substantial problem. We discussed specific weight loss strategies.

## 2011-02-28 NOTE — Assessment & Plan Note (Signed)
The patient has a history of heart failure but with preserved ejection fraction. We talked about the physiology of this and the need for blood pressure control salt and fluid restriction. However, at this point I don't think change to his therapy or further testing is indicated. He actually has less dyspnea and edema than I would suspect with this situation.

## 2011-02-28 NOTE — Assessment & Plan Note (Signed)
I looked over previous blood pressure readings. His blood pressure is somewhat labile and not always above target. He has a neighbor who can take his blood pressure and keep a diary. We talked about salt and fluid restriction and weight loss.

## 2011-02-28 NOTE — Progress Notes (Signed)
HPI The patient presents for followup of congestive heart year. I saw him greater than 3 years ago for evaluation of dyspnea. In 2006 he had heart failure with preserved ejection fraction. His last echo in 2009 had poor acoustic windows but an EF of 65%. He has dyspnea with previous respiratory failure and tracheostomy. He describes to me being hooked up to a ventilator at night although I am not sure whether this is CPAP.  He was referred back to his of his previous history. He doesn't report to me any change in his dyspnea. He is very inactive. He can and related to his house. However, he doesn't really leave his house. He does not describe any new dyspnea with exertion. His weights have been slowly creeping up over the years and he's had chronic lower extremity swelling been no recent change. He is not describing new PND or orthopnea. There has been no chest pressure, neck or arm discomfort. He doesn't report palpitations, presyncope or syncope.  No Known Allergies  Current Outpatient Prescriptions  Medication Sig Dispense Refill  . ALPRAZolam (XANAX XR) 3 MG 24 hr tablet Take 1 tablet (3 mg total) by mouth every morning.  30 tablet  5  . amLODipine (NORVASC) 10 MG tablet TAKE ONE TABLET BY MOUTH EVERY DAY  30 tablet  5  . BENICAR 40 MG tablet TAKE ONE TABLET BY MOUTH EVERY DAY  90 each  0  . citalopram (CELEXA) 40 MG tablet Take 1 tablet (40 mg total) by mouth daily.  30 tablet  11  . cloNIDine (CATAPRES) 0.2 MG tablet Take 1 tablet (0.2 mg total) by mouth 3 (three) times daily.  90 tablet  11  . furosemide (LASIX) 40 MG tablet Take 1 tablet (40 mg total) by mouth 2 (two) times daily.  60 tablet  11  . glucose blood test strip Use as directed three times a day       . hydrochlorothiazide 25 MG tablet TAKE ONE TABLET BY MOUTH EVERY DAY  30 tablet  4  . Insulin Syringe-Needle U-100 (RELION INSULIN SYR 0.5CC/30G) 30G X 5/16" 0.5 ML MISC Use as directed       . KLOR-CON M20 20 MEQ tablet TAKE TWO  TABLETS BY MOUTH EVERY DAY  60 each  2  . LANTUS 100 UNIT/ML injection INJECT  50 UNITS INTO THE SKIN EVERY MORNING  20 mL  5  . metoCLOPramide (REGLAN) 5 MG tablet TAKE ONE TABLET BY MOUTH THREE TIMES DAILY  90 tablet  2  . Multiple Vitamin (MULTIVITAMIN) tablet Take 1 tablet by mouth daily.        Marland Kitchen oxyCODONE (OXYCONTIN) 15 MG TB12 Take 15 mg by mouth 4 (four) times daily as needed.      . simvastatin (ZOCOR) 20 MG tablet TAKE ONE TABLET BY MOUTH AT BEDTIME  30 tablet  5  . warfarin (COUMADIN) 10 MG tablet Take 1 tablet (10 mg total) by mouth as directed.  40 tablet  1    Past Medical History  Diagnosis Date  . ANXIETY 07/23/2006  . ASTHMA 07/23/2006  . DM 05/23/2007  . HYPERTENSION 07/23/2006  . OSTEOARTHRITIS 07/23/2006  . CARCINOMA, PROSTATE 09/29/2009  . HYPERCHOLESTEROLEMIA 05/23/2007  . ANEMIA 08/31/2008  . CARDIOMYOPATHY 05/23/2007  . Palpitations 02/10/2008  . ALCOHOL ABUSE, IN REMISSION, HX OF 08/26/2008  . TOBACCO ABUSE, HX OF 08/26/2008  . Pulmonary embolism 03/16/2010  . ED (erectile dysfunction)   . Morbid obesity   . DM  retinopathy   . 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 DM neuropathy with neurologic complication   . CHF (congestive heart failure)   . 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  Family History  Problem Relation Age of Onset  . Cancer Neg Hx     History   Social History  . Marital Status: Divorced    Spouse Name: N/A    Number of Children: N/A  . Years of Education: N/A   Occupational History  . Disabled    Social History Main Topics  . Smoking status: Former Smoker    Quit date: 01/16/2005  . Smokeless tobacco: Not on file  . Alcohol Use: No  . Drug Use: Not on file  . Sexually Active: Not on file   Other Topics Concern  . Not on file   Social History Narrative   SingleLives with his elderly mother    ROS:  Positive for  joint pains, leg file. Otherwise as stated in the HPI and negative for all other systems.  PHYSICAL EXAM BP 155/90  Pulse 73  Ht 6\' 2"  (1.88 m)  Wt 384 lb (174.181 kg)  BMI 49.30 kg/m2 PHYSICAL EXAM GEN:  No distress HEENT:  Right eye enucleation.   NECK:  No jugular venous distention at 90 degrees, waveform within normal limits, carotid upstroke brisk and symmetric, no bruits, no thyromegaly, tracheostomy. LYMPHATICS:  No cervical adenopathy LUNGS:  Clear to auscultation bilaterally BACK:  No CVA tenderness CHEST:  Unremarkable HEART:  S1 and S2 within normal limits, no S3, no S4, no clicks, no rubs, no murmurs ABD:  Positive bowel sounds normal in frequency in pitch, no bruits, no rebound, no guarding, unable to assess midline mass or bruit with the patient seated. EXT:  2 plus pulses throughout, moderate edema, no cyanosis no clubbing SKIN:  No rashes no nodules NEURO:  Cranial nerves II through XII grossly intact, motor grossly intact throughout PSYCH:  Cognitively intact, oriented to person place and time  EKG:  Sinus rhythm, left axis deviation, left anterior fascicular block, premature ventricular contractions, diffuse T-wave flattening. 02/28/2011   ASSESSMENT AND PLAN

## 2011-03-01 ENCOUNTER — Other Ambulatory Visit: Payer: Self-pay | Admitting: Endocrinology

## 2011-03-09 ENCOUNTER — Inpatient Hospital Stay (HOSPITAL_COMMUNITY)
Admission: EM | Admit: 2011-03-09 | Discharge: 2011-03-17 | DRG: 292 | Disposition: A | Discharge status: Home / Self Care | Payer: Medicare Other | Attending: Internal Medicine | Admitting: Internal Medicine

## 2011-03-09 DIAGNOSIS — R531 Weakness: Secondary | ICD-10-CM | POA: Diagnosis present

## 2011-03-09 DIAGNOSIS — I4729 Other ventricular tachycardia: Secondary | ICD-10-CM | POA: Diagnosis present

## 2011-03-09 DIAGNOSIS — Z8546 Personal history of malignant neoplasm of prostate: Secondary | ICD-10-CM

## 2011-03-09 DIAGNOSIS — R29898 Other symptoms and signs involving the musculoskeletal system: Secondary | ICD-10-CM | POA: Diagnosis present

## 2011-03-09 DIAGNOSIS — Z930 Tracheostomy status: Secondary | ICD-10-CM

## 2011-03-09 DIAGNOSIS — E876 Hypokalemia: Secondary | ICD-10-CM | POA: Diagnosis not present

## 2011-03-09 DIAGNOSIS — E1149 Type 2 diabetes mellitus with other diabetic neurological complication: Secondary | ICD-10-CM | POA: Diagnosis present

## 2011-03-09 DIAGNOSIS — Z7901 Long term (current) use of anticoagulants: Secondary | ICD-10-CM

## 2011-03-09 DIAGNOSIS — G4733 Obstructive sleep apnea (adult) (pediatric): Secondary | ICD-10-CM

## 2011-03-09 DIAGNOSIS — I472 Ventricular tachycardia, unspecified: Secondary | ICD-10-CM | POA: Diagnosis present

## 2011-03-09 DIAGNOSIS — I493 Ventricular premature depolarization: Secondary | ICD-10-CM | POA: Diagnosis present

## 2011-03-09 DIAGNOSIS — I509 Heart failure, unspecified: Secondary | ICD-10-CM | POA: Diagnosis present

## 2011-03-09 DIAGNOSIS — Z86711 Personal history of pulmonary embolism: Secondary | ICD-10-CM

## 2011-03-09 DIAGNOSIS — G4730 Sleep apnea, unspecified: Secondary | ICD-10-CM

## 2011-03-09 DIAGNOSIS — Z6841 Body Mass Index (BMI) 40.0 and over, adult: Secondary | ICD-10-CM

## 2011-03-09 DIAGNOSIS — Z87891 Personal history of nicotine dependence: Secondary | ICD-10-CM

## 2011-03-09 DIAGNOSIS — I5033 Acute on chronic diastolic (congestive) heart failure: Principal | ICD-10-CM | POA: Diagnosis present

## 2011-03-09 DIAGNOSIS — I10 Essential (primary) hypertension: Secondary | ICD-10-CM | POA: Diagnosis present

## 2011-03-09 DIAGNOSIS — E1142 Type 2 diabetes mellitus with diabetic polyneuropathy: Secondary | ICD-10-CM | POA: Diagnosis present

## 2011-03-09 DIAGNOSIS — I2699 Other pulmonary embolism without acute cor pulmonale: Secondary | ICD-10-CM | POA: Diagnosis present

## 2011-03-09 HISTORY — DX: Sleep apnea, unspecified: G47.30

## 2011-03-09 HISTORY — DX: Shortness of breath: R06.02

## 2011-03-09 NOTE — ED Notes (Addendum)
Pt AO X 4.  Pt able to push against resistance with LE.  He feels weakness is weight-bearing related.  However, his daughter stated for the last month she has noticed that he is walking "like my toddler" and holding on to things.

## 2011-03-09 NOTE — ED Notes (Addendum)
Earlier today pt was unable to get out of his chair to stand d/t weakness.  Pt is being given "steroid shots" for bil knee pain and "loose bones" but he states it is not working.  EMS attempted to ambulate pt and his knees buckled.  EMS CBG of 89.

## 2011-03-10 ENCOUNTER — Encounter (HOSPITAL_COMMUNITY): Payer: Self-pay | Admitting: Internal Medicine

## 2011-03-10 ENCOUNTER — Other Ambulatory Visit: Payer: Self-pay

## 2011-03-10 ENCOUNTER — Emergency Department (HOSPITAL_COMMUNITY): Payer: Medicare Other

## 2011-03-10 DIAGNOSIS — R531 Weakness: Secondary | ICD-10-CM | POA: Diagnosis present

## 2011-03-10 LAB — CARDIAC PANEL(CRET KIN+CKTOT+MB+TROPI)
CK, MB: 2.3 ng/mL (ref 0.3–4.0)
CK, MB: 2 ng/mL (ref 0.3–4.0)
CK, MB: 2 ng/mL (ref 0.3–4.0)
Total CK: 141 U/L (ref 7–232)
Troponin I: <0.3 ng/mL (ref <0.30)

## 2011-03-10 LAB — URINALYSIS, ROUTINE W REFLEX MICROSCOPIC
Hgb urine dipstick: NEGATIVE
Leukocytes, UA: NEGATIVE
Nitrite: NEGATIVE
Protein, ur: >300 mg/dL — AB
Specific Gravity, Urine: 1.026 (ref 1.005–1.030)
Urobilinogen, UA: 1 mg/dL (ref 0.0–1.0)

## 2011-03-10 LAB — DIFFERENTIAL
Basophils Absolute: 0 10*3/uL (ref 0.0–0.1)
Basophils Relative: 0 % (ref 0–1)
Eosinophils Relative: 1 % (ref 0–5)
Lymphocytes Relative: 17 % (ref 12–46)
Neutro Abs: 4.5 10*3/uL (ref 1.7–7.7)

## 2011-03-10 LAB — CBC
Hemoglobin: 12.8 g/dL — ABNORMAL LOW (ref 13.0–17.0)
MCHC: 31.5 g/dL (ref 30.0–36.0)
MCHC: 32.5 g/dL (ref 30.0–36.0)
Platelets: 168 10*3/uL (ref 150–400)
RDW: 13.6 % (ref 11.5–15.5)
WBC: 6.1 10*3/uL (ref 4.0–10.5)
WBC: 7.1 10*3/uL (ref 4.0–10.5)

## 2011-03-10 LAB — COMPREHENSIVE METABOLIC PANEL
ALT: 10 U/L (ref 0–53)
ALT: 9 U/L (ref 0–53)
AST: 15 U/L (ref 0–37)
Albumin: 3.5 g/dL (ref 3.5–5.2)
BUN: 21 mg/dL (ref 6–23)
CO2: 30 mEq/L (ref 19–32)
CO2: 29 mEq/L (ref 19–32)
Calcium: 9.9 mg/dL (ref 8.4–10.5)
Calcium: 10 mg/dL (ref 8.4–10.5)
Creatinine, Ser: 1.27 mg/dL (ref 0.50–1.35)
Creatinine, Ser: 1.31 mg/dL (ref 0.50–1.35)
GFR calc Af Amer: 64 mL/min — ABNORMAL LOW (ref >90)
GFR calc non Af Amer: 58 mL/min — ABNORMAL LOW (ref >90)
GFR calc non Af Amer: 56 mL/min — ABNORMAL LOW (ref >90)
Glucose, Bld: 163 mg/dL — ABNORMAL HIGH (ref 70–99)
Sodium: 142 mEq/L (ref 135–145)
Sodium: 141 mEq/L (ref 135–145)

## 2011-03-10 LAB — GLUCOSE, CAPILLARY
Glucose-Capillary: 122 mg/dL — ABNORMAL HIGH (ref 70–99)
Glucose-Capillary: 158 mg/dL — ABNORMAL HIGH (ref 70–99)
Glucose-Capillary: 150 mg/dL — ABNORMAL HIGH (ref 70–99)
Glucose-Capillary: 174 mg/dL — ABNORMAL HIGH (ref 70–99)

## 2011-03-10 LAB — TSH: TSH: 1.539 u[IU]/mL (ref 0.350–4.500)

## 2011-03-10 LAB — PROTIME-INR
INR: 2.07 — ABNORMAL HIGH (ref 0.00–1.49)
Prothrombin Time: 23.7 seconds — ABNORMAL HIGH (ref 11.6–15.2)

## 2011-03-10 LAB — APTT: aPTT: 39 seconds — ABNORMAL HIGH (ref 24–37)

## 2011-03-10 LAB — MAGNESIUM: Magnesium: 2.1 mg/dL (ref 1.5–2.5)

## 2011-03-10 LAB — HEMOGLOBIN A1C: Hgb A1c MFr Bld: 6 % — ABNORMAL HIGH (ref <5.7)

## 2011-03-10 LAB — PRO B NATRIURETIC PEPTIDE: Pro B Natriuretic peptide (BNP): 266.8 pg/mL — ABNORMAL HIGH (ref 0–125)

## 2011-03-10 LAB — URINE MICROSCOPIC-ADD ON

## 2011-03-10 MED ORDER — FUROSEMIDE 10 MG/ML IJ SOLN
80.0000 mg | Freq: Once | INTRAMUSCULAR | Status: AC
  Administered 2011-03-10: 80 mg via INTRAVENOUS
  Filled 2011-03-10: qty 8

## 2011-03-10 MED ORDER — ACETAMINOPHEN 325 MG PO TABS
650.0000 mg | ORAL_TABLET | Freq: Four times a day (QID) | ORAL | Status: DC | PRN
  Administered 2011-03-11 – 2011-03-16 (×6): 650 mg via ORAL
  Filled 2011-03-10 (×6): qty 2

## 2011-03-10 MED ORDER — ACETAMINOPHEN 650 MG RE SUPP: 650.0000 mg | Freq: Four times a day (QID) | RECTAL | Status: DC | PRN

## 2011-03-10 MED ORDER — CITALOPRAM HYDROBROMIDE 20 MG PO TABS
20.0000 mg | ORAL_TABLET | Freq: Every day | ORAL | Status: DC
  Administered 2011-03-11 – 2011-03-17 (×7): 20 mg via ORAL
  Filled 2011-03-10 (×8): qty 1

## 2011-03-10 MED ORDER — OLMESARTAN MEDOXOMIL 40 MG PO TABS
40.0000 mg | ORAL_TABLET | Freq: Every day | ORAL | Status: DC
  Administered 2011-03-10 – 2011-03-12 (×3): 40 mg via ORAL
  Filled 2011-03-10 (×3): qty 1

## 2011-03-10 MED ORDER — PNEUMOCOCCAL VAC POLYVALENT 25 MCG/0.5ML IJ INJ
0.5000 mL | INJECTION | Freq: Once | INTRAMUSCULAR | Status: AC
  Administered 2011-03-10: 0.5 mL via INTRAMUSCULAR
  Filled 2011-03-10: qty 0.5

## 2011-03-10 MED ORDER — AMLODIPINE BESYLATE 10 MG PO TABS
10.0000 mg | ORAL_TABLET | Freq: Every day | ORAL | Status: DC
  Administered 2011-03-10: 10 mg via ORAL
  Filled 2011-03-10: qty 1

## 2011-03-10 MED ORDER — SIMVASTATIN 20 MG PO TABS
20.0000 mg | ORAL_TABLET | Freq: Every evening | ORAL | Status: DC
  Administered 2011-03-10 – 2011-03-16 (×7): 20 mg via ORAL
  Filled 2011-03-10 (×9): qty 1

## 2011-03-10 MED ORDER — SODIUM CHLORIDE 0.9 % IJ SOLN
3.0000 mL | Freq: Two times a day (BID) | INTRAMUSCULAR | Status: DC
  Administered 2011-03-10 – 2011-03-12 (×4): 3 mL via INTRAVENOUS

## 2011-03-10 MED ORDER — ALPRAZOLAM ER 1 MG PO TB24: 3.0000 mg | ORAL_TABLET | Freq: Every day | ORAL | Status: DC

## 2011-03-10 MED ORDER — ONDANSETRON HCL 4 MG PO TABS: 4.0000 mg | ORAL_TABLET | Freq: Four times a day (QID) | ORAL | Status: DC | PRN

## 2011-03-10 MED ORDER — FUROSEMIDE 10 MG/ML IJ SOLN
60.0000 mg | Freq: Three times a day (TID) | INTRAMUSCULAR | Status: DC
  Administered 2011-03-10 – 2011-03-12 (×8): 60 mg via INTRAVENOUS
  Filled 2011-03-10 (×10): qty 6

## 2011-03-10 MED ORDER — POTASSIUM CHLORIDE CRYS ER 20 MEQ PO TBCR
40.0000 meq | EXTENDED_RELEASE_TABLET | Freq: Every day | ORAL | Status: DC
  Administered 2011-03-10 – 2011-03-11 (×2): 40 meq via ORAL
  Filled 2011-03-10 (×2): qty 2

## 2011-03-10 MED ORDER — ONDANSETRON HCL 4 MG/2ML IJ SOLN: 4.0000 mg | Freq: Four times a day (QID) | INTRAMUSCULAR | Status: DC | PRN

## 2011-03-10 MED ORDER — ALISKIREN FUMARATE 150 MG PO TABS
75.0000 mg | ORAL_TABLET | Freq: Every day | ORAL | Status: DC
  Administered 2011-03-10: 75 mg via ORAL
  Filled 2011-03-10: qty 1

## 2011-03-10 MED ORDER — CHLORHEXIDINE GLUCONATE CLOTH 2 % EX PADS
6.0000 | MEDICATED_PAD | Freq: Every day | CUTANEOUS | Status: AC
  Administered 2011-03-10 – 2011-03-14 (×4): 6 via TOPICAL

## 2011-03-10 MED ORDER — ADULT MULTIVITAMIN W/MINERALS CH
1.0000 | ORAL_TABLET | Freq: Every day | ORAL | Status: DC
  Administered 2011-03-10 – 2011-03-17 (×8): 1 via ORAL
  Filled 2011-03-10 (×10): qty 1

## 2011-03-10 MED ORDER — HYDROCHLOROTHIAZIDE 25 MG PO TABS
25.0000 mg | ORAL_TABLET | Freq: Every day | ORAL | Status: DC
  Administered 2011-03-10 – 2011-03-13 (×4): 25 mg via ORAL
  Filled 2011-03-10 (×4): qty 1

## 2011-03-10 MED ORDER — ALPRAZOLAM 0.25 MG PO TABS
1.0000 mg | ORAL_TABLET | Freq: Three times a day (TID) | ORAL | Status: DC
  Administered 2011-03-10 – 2011-03-12 (×7): 1 mg via ORAL
  Filled 2011-03-10: qty 4
  Filled 2011-03-10: qty 1
  Filled 2011-03-10: qty 4
  Filled 2011-03-10: qty 1
  Filled 2011-03-10: qty 2
  Filled 2011-03-10: qty 1
  Filled 2011-03-10: qty 2
  Filled 2011-03-10: qty 4
  Filled 2011-03-10: qty 3

## 2011-03-10 MED ORDER — BIOTENE DRY MOUTH MT LIQD
15.0000 mL | Freq: Four times a day (QID) | OROMUCOSAL | Status: DC
  Administered 2011-03-11 – 2011-03-17 (×26): 15 mL via OROMUCOSAL

## 2011-03-10 MED ORDER — WARFARIN SODIUM 5 MG PO TABS
5.0000 mg | ORAL_TABLET | ORAL | Status: DC
  Administered 2011-03-13 – 2011-03-16 (×2): 5 mg via ORAL
  Filled 2011-03-10 (×2): qty 1

## 2011-03-10 MED ORDER — OXYCODONE HCL 5 MG PO TABS: 15.0000 mg | ORAL_TABLET | Freq: Four times a day (QID) | ORAL | Status: DC | PRN

## 2011-03-10 MED ORDER — METOCLOPRAMIDE HCL 5 MG PO TABS
5.0000 mg | ORAL_TABLET | Freq: Three times a day (TID) | ORAL | Status: DC
  Administered 2011-03-10 – 2011-03-17 (×23): 5 mg via ORAL
  Filled 2011-03-10 (×30): qty 1

## 2011-03-10 MED ORDER — FUROSEMIDE 10 MG/ML IJ SOLN: 60.0000 mg | Freq: Three times a day (TID) | INTRAMUSCULAR | Status: DC

## 2011-03-10 MED ORDER — WARFARIN SODIUM 10 MG PO TABS
10.0000 mg | ORAL_TABLET | ORAL | Status: DC
  Administered 2011-03-10: 10 mg via ORAL
  Filled 2011-03-10 (×2): qty 1

## 2011-03-10 MED ORDER — SODIUM CHLORIDE 0.9 % IJ SOLN
3.0000 mL | Freq: Two times a day (BID) | INTRAMUSCULAR | Status: DC
  Administered 2011-03-10 – 2011-03-17 (×14): 3 mL via INTRAVENOUS

## 2011-03-10 MED ORDER — CITALOPRAM HYDROBROMIDE 40 MG PO TABS
40.0000 mg | ORAL_TABLET | Freq: Every day | ORAL | Status: DC
  Administered 2011-03-10: 40 mg via ORAL
  Filled 2011-03-10 (×2): qty 1

## 2011-03-10 MED ORDER — INSULIN ASPART 100 UNIT/ML ~~LOC~~ SOLN
0.0000 [IU] | Freq: Three times a day (TID) | SUBCUTANEOUS | Status: DC
  Administered 2011-03-10: 1 [IU] via SUBCUTANEOUS
  Administered 2011-03-10 – 2011-03-11 (×2): 2 [IU] via SUBCUTANEOUS
  Administered 2011-03-11: 1 [IU] via SUBCUTANEOUS
  Administered 2011-03-11 – 2011-03-12 (×2): 2 [IU] via SUBCUTANEOUS
  Administered 2011-03-12: 1 [IU] via SUBCUTANEOUS
  Administered 2011-03-13 (×2): 2 [IU] via SUBCUTANEOUS
  Administered 2011-03-14: 1 [IU] via SUBCUTANEOUS
  Administered 2011-03-14: 2 [IU] via SUBCUTANEOUS
  Filled 2011-03-10: qty 3

## 2011-03-10 MED ORDER — CHLORHEXIDINE GLUCONATE 0.12 % MT SOLN
15.0000 mL | Freq: Two times a day (BID) | OROMUCOSAL | Status: DC
  Administered 2011-03-11 – 2011-03-17 (×13): 15 mL via OROMUCOSAL
  Filled 2011-03-10 (×16): qty 15

## 2011-03-10 MED ORDER — INSULIN GLARGINE 100 UNIT/ML ~~LOC~~ SOLN
50.0000 [IU] | Freq: Every morning | SUBCUTANEOUS | Status: DC
  Administered 2011-03-10 – 2011-03-17 (×8): 50 [IU] via SUBCUTANEOUS
  Filled 2011-03-10 (×2): qty 3

## 2011-03-10 MED ORDER — MUPIROCIN 2 % EX OINT
1.0000 "application " | TOPICAL_OINTMENT | Freq: Two times a day (BID) | CUTANEOUS | Status: AC
  Administered 2011-03-10 – 2011-03-14 (×10): 1 via NASAL
  Filled 2011-03-10: qty 22

## 2011-03-10 MED ORDER — CLONIDINE HCL 0.2 MG PO TABS
0.2000 mg | ORAL_TABLET | Freq: Three times a day (TID) | ORAL | Status: DC
  Administered 2011-03-10 – 2011-03-12 (×8): 0.2 mg via ORAL
  Filled 2011-03-10 (×11): qty 1

## 2011-03-10 MED ORDER — CARVEDILOL 6.25 MG PO TABS
6.2500 mg | ORAL_TABLET | Freq: Two times a day (BID) | ORAL | Status: DC
  Administered 2011-03-10 – 2011-03-12 (×4): 6.25 mg via ORAL
  Filled 2011-03-10 (×6): qty 1

## 2011-03-10 NOTE — ED Notes (Signed)
Patient transported to X-ray 

## 2011-03-10 NOTE — Progress Notes (Signed)
PHARMACY - ANTICOAGULATION CONSULT INITIAL NOTE  Pharmacy Consult for: Coumadin  Indication: H/o pulmonary embolism     Patient Data:   Allergies: No Known Allergies  Patient Measurements: Height: 6\' 2"  (188 cm) Weight: 375 lb 7.1 oz (170.3 kg) IBW/kg (Calculated) : 82.2    Vital Signs: Temp:  [98 F (36.7 C)-98.4 F (36.9 C)] 98.4 F (36.9 C) (02/22 0620) Pulse Rate:  [65-88] 65  (02/22 0620) Resp:  [14-19] 18  (02/22 0620) BP: (135-189)/(72-97) 189/72 mmHg (02/22 0620) SpO2:  [94 %-97 %] 97 % (02/22 0620) Weight:  [375 lb 7.1 oz (170.3 kg)] 375 lb 7.1 oz (170.3 kg) (02/22 0620)  Intake/Output from previous day:  Intake/Output Summary (Last 24 hours) at 03/10/11 0635 Last data filed at 03/10/11 0509  Gross per 24 hour  Intake    250 ml  Output   2001 ml  Net  -1751 ml    Labs:  Basename 03/10/11 0500 03/10/11 0019  HGB 12.8* 11.5*  HCT 39.4 36.5*  PLT 192 168  APTT -- 39*  LABPROT -- 23.7*  INR -- 2.07*  HEPARINUNFRC -- --  CREATININE 1.31 1.27  CKTOTAL -- --  CKMB -- --  TROPONINI -- --   Estimated Creatinine Clearance: 93.4 ml/min (by C-G formula based on Cr of 1.31).  Medical History: Past Medical History  Diagnosis Date  . ANXIETY 07/23/2006  . ASTHMA 07/23/2006  . DM 05/23/2007  . HYPERTENSION 07/23/2006  . OSTEOARTHRITIS 07/23/2006  . HYPERCHOLESTEROLEMIA 05/23/2007  . ANEMIA 08/31/2008  . Palpitations 02/10/2008  . ALCOHOL ABUSE, IN REMISSION, HX OF 08/26/2008  . TOBACCO ABUSE, HX OF 08/26/2008  . Pulmonary embolism 03/16/2010  . ED (erectile dysfunction)   . Morbid obesity   . DM retinopathy   . 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 DM neuropathy with neurologic complication   . CHF (congestive heart failure)     With a preserved EF  . 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 Sleep apnea   . Shortness of breath   . CARCINOMA, PROSTATE 09/29/2009    Scheduled  medications:     . aliskiren  75 mg Oral Daily  . ALPRAZolam  1 mg Oral TID  . amLODipine  10 mg Oral Daily  . citalopram  40 mg Oral Daily  . cloNIDine  0.2 mg Oral Q8H  . furosemide  60 mg Intravenous Q8H  . furosemide  80 mg Intravenous Once  . hydrochlorothiazide  25 mg Oral Daily  . insulin aspart  0-9 Units Subcutaneous TID WC  . insulin glargine  50 Units Subcutaneous q morning - 10a  . metoCLOPramide  5 mg Oral TID WC  . mulitivitamin with minerals  1 tablet Oral Daily  . olmesartan  40 mg Oral Daily  . potassium chloride SA  40 mEq Oral Daily  . simvastatin  20 mg Oral QPM  . sodium chloride  3 mL Intravenous Q12H  . sodium chloride  3 mL Intravenous Q12H  . DISCONTD: ALPRAZolam  3 mg Oral QHS  . DISCONTD: furosemide  60 mg Intravenous Q8H     Assessment:  66 y.o. male admitted on 03/09/2011, with h/o pulmonary embolism per 12/09/2010 anticoagulation note. Pharmacy consulted to manage Coumadin. INR (2.07) is at-goal. PTA patient takes Coumadin 10mg  daily, except 5mg  on Monday and Thursdays.   Goal of Therapy:  1. INR  2-3  Plan:  1. Continue home Coumadin regimen of 10mg  daily, except 5mg  on Monday and Thursdays. 2. Daily PT / INR.  Dineen Kid Thad Ranger, PharmD 03/10/2011, 6:35 AM

## 2011-03-10 NOTE — Progress Notes (Signed)
Pt was noted PVCs on the monitor and some couplets,denied any discomfort otherwise-Dr Mcclung made aware.

## 2011-03-10 NOTE — ED Notes (Signed)
Dr. Kakrakandy at bedside. 

## 2011-03-10 NOTE — ED Notes (Signed)
RT cleaned inner cannula and is attempted to order new inner cannula. She stated there is a hole in the cannuala.

## 2011-03-10 NOTE — ED Notes (Addendum)
PVC's are increasing with several triplicates.  Pt continues to deny pain and denies nv.  Dr Toniann Fail paged.

## 2011-03-10 NOTE — ED Notes (Signed)
Dr Toniann Fail feels no further actions need to be taken other than observation and labs at this time.  Await orders to send pt to 2900.

## 2011-03-10 NOTE — H&P (Signed)
Travis Chapman is an 66 y.o. male.   PCP - Dr.[name] Everardo All Chief Complaint: Shortness of breath and weakness. HPI: 66 year-old male with history of diastolic CHF, OSA status post tracheostomy and uses ventilator at bedtime, questionable history of PE versus atrial fibrillation on Coumadin, history of hypertension, diabetes mellitus2 presents to the ER because of increasing weakness for the last couple of days and getting short of breath with minimal exertion. Patient in addition also has noted that he has gained weight but does not recall exactly how much. Chest x-ray in the ER shows vascular congestion and pulmonary edema. Patient had almost 1600 cc output after 80 mg of IV Lasix. Patient denies any chest pain loss of consciousness any focal deficits fever chills cough or phlegm. Patient will be admitted for further management.  Past Medical History  Diagnosis Date  . ANXIETY 07/23/2006  . ASTHMA 07/23/2006  . DM 05/23/2007  . HYPERTENSION 07/23/2006  . OSTEOARTHRITIS 07/23/2006  . CARCINOMA, PROSTATE 09/29/2009  . HYPERCHOLESTEROLEMIA 05/23/2007  . ANEMIA 08/31/2008  . Palpitations 02/10/2008  . ALCOHOL ABUSE, IN REMISSION, HX OF 08/26/2008  . TOBACCO ABUSE, HX OF 08/26/2008  . Pulmonary embolism 03/16/2010  . ED (erectile dysfunction)   . Morbid obesity   . DM retinopathy   . 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 DM neuropathy with neurologic complication   . CHF (congestive heart failure)     With a preserved EF  . 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 Back surgery   . Tracheostomy     Family History  Problem Relation Age of Onset  . Cancer Neg Hx    Social History:  reports that he quit smoking about 6 years ago. He does not have any smokeless tobacco history on file. He reports that he does not drink alcohol. His drug history not on file.  Allergies: No Known  Allergies  Medications Prior to Admission  Medication Dose Route Frequency Provider Last Rate Last Dose  . furosemide (LASIX) injection 80 mg  80 mg Intravenous Once Felisa Bonier, MD   80 mg at 03/10/11 0207   Medications Prior to Admission  Medication Sig Dispense Refill  . aliskiren (TEKTURNA) 150 MG tablet Take 75 mg by mouth daily.      Marland Kitchen ALPRAZolam (XANAX XR) 3 MG 24 hr tablet Take 3 mg by mouth at bedtime.      . citalopram (CELEXA) 40 MG tablet Take 40 mg by mouth daily.      . cloNIDine (CATAPRES) 0.2 MG tablet Take 0.2 mg by mouth 3 (three) times daily.      . furosemide (LASIX) 40 MG tablet Take 40 mg by mouth 2 (two) times daily.      . Multiple Vitamin (MULTIVITAMIN) tablet Take 1 tablet by mouth daily.        Marland Kitchen oxyCODONE (OXYCONTIN) 15 MG TB12 Take 15 mg by mouth 4 (four) times daily as needed. For pain.      Marland Kitchen warfarin (COUMADIN) 10 MG tablet Take 5-10 mg by mouth daily. Take 1 tablet (10 MG) every day except Mondays and Thursdays. On Mondays and Thursdays take 0.5 tablets (5 MG)        Results for orders placed during the hospital encounter of 03/09/11 (from the past 48 hour(s))  CBC  Status: Abnormal   Collection Time   03/10/11 12:19 AM      Component Value Range Comment   WBC 6.1  4.0 - 10.5 (K/uL)    RBC 4.06 (*) 4.22 - 5.81 (MIL/uL)    Hemoglobin 11.5 (*) 13.0 - 17.0 (g/dL)    HCT 16.1 (*) 09.6 - 52.0 (%)    MCV 89.9  78.0 - 100.0 (fL)    MCH 28.3  26.0 - 34.0 (pg)    MCHC 31.5  30.0 - 36.0 (g/dL)    RDW 04.5  40.9 - 81.1 (%)    Platelets 168  150 - 400 (K/uL)   DIFFERENTIAL     Status: Normal   Collection Time   03/10/11 12:19 AM      Component Value Range Comment   Neutrophils Relative 74  43 - 77 (%)    Neutro Abs 4.5  1.7 - 7.7 (K/uL)    Lymphocytes Relative 17  12 - 46 (%)    Lymphs Abs 1.0  0.7 - 4.0 (K/uL)    Monocytes Relative 8  3 - 12 (%)    Monocytes Absolute 0.5  0.1 - 1.0 (K/uL)    Eosinophils Relative 1  0 - 5 (%)    Eosinophils  Absolute 0.1  0.0 - 0.7 (K/uL)    Basophils Relative 0  0 - 1 (%)    Basophils Absolute 0.0  0.0 - 0.1 (K/uL)   COMPREHENSIVE METABOLIC PANEL     Status: Abnormal   Collection Time   03/10/11 12:19 AM      Component Value Range Comment   Sodium 142  135 - 145 (mEq/L)    Potassium 4.4  3.5 - 5.1 (mEq/L)    Chloride 106  96 - 112 (mEq/L)    CO2 30  19 - 32 (mEq/L)    Glucose, Bld 60 (*) 70 - 99 (mg/dL)    BUN 22  6 - 23 (mg/dL)    Creatinine, Ser 9.14  0.50 - 1.35 (mg/dL)    Calcium 9.9  8.4 - 10.5 (mg/dL)    Total Protein 7.5  6.0 - 8.3 (g/dL)    Albumin 3.5  3.5 - 5.2 (g/dL)    AST 15  0 - 37 (U/L)    ALT 10  0 - 53 (U/L)    Alkaline Phosphatase 77  39 - 117 (U/L)    Total Bilirubin 0.2 (*) 0.3 - 1.2 (mg/dL)    GFR calc non Af Amer 58 (*) >90 (mL/min)    GFR calc Af Amer 67 (*) >90 (mL/min)   LIPASE, BLOOD     Status: Abnormal   Collection Time   03/10/11 12:19 AM      Component Value Range Comment   Lipase 75 (*) 11 - 59 (U/L)   PROTIME-INR     Status: Abnormal   Collection Time   03/10/11 12:19 AM      Component Value Range Comment   Prothrombin Time 23.7 (*) 11.6 - 15.2 (seconds)    INR 2.07 (*) 0.00 - 1.49    APTT     Status: Abnormal   Collection Time   03/10/11 12:19 AM      Component Value Range Comment   aPTT 39 (*) 24 - 37 (seconds)   PRO B NATRIURETIC PEPTIDE     Status: Abnormal   Collection Time   03/10/11 12:29 AM      Component Value Range Comment   Pro B Natriuretic peptide (BNP) 266.8 (*)  0 - 125 (pg/mL)   URINALYSIS, ROUTINE W REFLEX MICROSCOPIC     Status: Abnormal   Collection Time   03/10/11 12:42 AM      Component Value Range Comment   Color, Urine YELLOW  YELLOW     APPearance CLEAR  CLEAR     Specific Gravity, Urine 1.026  1.005 - 1.030     pH 5.5  5.0 - 8.0     Glucose, UA NEGATIVE  NEGATIVE (mg/dL)    Hgb urine dipstick NEGATIVE  NEGATIVE     Bilirubin Urine NEGATIVE  NEGATIVE     Ketones, ur NEGATIVE  NEGATIVE (mg/dL)    Protein, ur >161 (*)  NEGATIVE (mg/dL)    Urobilinogen, UA 1.0  0.0 - 1.0 (mg/dL)    Nitrite NEGATIVE  NEGATIVE     Leukocytes, UA NEGATIVE  NEGATIVE    URINE MICROSCOPIC-ADD ON     Status: Abnormal   Collection Time   03/10/11 12:42 AM      Component Value Range Comment   Squamous Epithelial / LPF RARE  RARE     Bacteria, UA RARE  RARE     Casts GRANULAR CAST (*) NEGATIVE    POCT I-STAT TROPONIN I     Status: Normal   Collection Time   03/10/11  1:10 AM      Component Value Range Comment   Troponin i, poc 0.00  0.00 - 0.08 (ng/mL)    Comment 3            GLUCOSE, CAPILLARY     Status: Abnormal   Collection Time   03/10/11  2:52 AM      Component Value Range Comment   Glucose-Capillary 122 (*) 70 - 99 (mg/dL)    Comment 1 Notify RN      Comment 2 Documented in Chart     CBC     Status: Abnormal   Collection Time   03/10/11  5:00 AM      Component Value Range Comment   WBC 7.1  4.0 - 10.5 (K/uL)    RBC 4.43  4.22 - 5.81 (MIL/uL)    Hemoglobin 12.8 (*) 13.0 - 17.0 (g/dL)    HCT 09.6  04.5 - 40.9 (%)    MCV 88.9  78.0 - 100.0 (fL)    MCH 28.9  26.0 - 34.0 (pg)    MCHC 32.5  30.0 - 36.0 (g/dL)    RDW 81.1  91.4 - 78.2 (%)    Platelets 192  150 - 400 (K/uL)    Dg Chest 2 View  03/10/2011  *RADIOLOGY REPORT*  Clinical Data: Weakness; shortness of breath.  Inability to ambulate.  History of asthma and diabetes.  CHEST - 2 VIEW  Comparison: Chest radiograph performed 06/28/2005  Findings: The patient's tracheostomy tube is seen ending 6-7 cm above the carina.  The lungs are well-aerated.  Vascular congestion is noted, with question of mild interstitial edema, especially on correlation with the lateral view.  There is no evidence of pleural effusion or pneumothorax.  The heart remains significantly enlarged; the mediastinal contour is within normal limits.  No acute osseous abnormalities are seen.  IMPRESSION: Vascular congestion and significant cardiomegaly, with concern for mild pulmonary edema, particularly  on the lateral view.  Original Report Authenticated By: Tonia Ghent, M.D.    Review of Systems  HENT: Negative.   Eyes: Negative.   Respiratory: Positive for shortness of breath.   Cardiovascular: Negative.   Gastrointestinal: Negative.  Genitourinary: Negative.   Musculoskeletal: Negative.   Skin: Negative.   Neurological: Positive for weakness.  Endo/Heme/Allergies: Negative.   Psychiatric/Behavioral: Negative.     Blood pressure 135/97, pulse 88, temperature 98 F (36.7 C), temperature source Oral, resp. rate 14, SpO2 94.00%. Physical Exam  Constitutional: He is oriented to person, place, and time. He appears well-developed and well-nourished. No distress.  HENT:  Head: Normocephalic and atraumatic.  Right Ear: External ear normal.  Left Ear: External ear normal.  Nose: Nose normal.  Mouth/Throat: Oropharynx is clear and moist. No oropharyngeal exudate.  Eyes: Conjunctivae are normal. Right eye exhibits no discharge. Left eye exhibits no discharge. No scleral icterus.       Blind right eye.  Neck: Normal range of motion. Neck supple.  Cardiovascular: Normal rate and regular rhythm.   Respiratory: Effort normal and breath sounds normal. No respiratory distress. He has no wheezes.  GI: Soft. Bowel sounds are normal. He exhibits no distension. There is no tenderness. There is no rebound.  Musculoskeletal: He exhibits edema. He exhibits no tenderness.  Neurological: He is alert and oriented to person, place, and time.       Moves upper and lower extremities..  Skin: Skin is warm and dry. He is not diaphoretic.  Psychiatric: His behavior is normal.     Assessment/Plan #1. Decompensated diastolic CHF - we will continue with IV Lasix 60 mg IV every 8 with close followup of intake output and daily weights. Check metabolic panel. Cycle cardiac markers. #2. History of OSA status post tracheostomy and uses ventilator at bedtime - have discussed pulmonary consult. At this time  they will be monitoring the ventilator requirements. #3. Diabetes mellitus type 2 - continue present medications with sliding scale coverage. #4. On Coumadin for possible PE versus A. Fib - Coumadin per pharmacy. Per patient's old chart it states that patient had a PE. EKG shows sinus rhythm at this time. #5. Moderate obesity.  Code Status - Full code.  Galileah Piggee   03/10/2011,  5:41 AM

## 2011-03-10 NOTE — Progress Notes (Signed)
SPOKE WITH THE PT AND HIS FAMILY ABOUT THE CARE GIVEN AT HOME.  PT LIVES AT HOME WITH HIS MOTHER AND HAS A TRACH WHICH IS MANAGED BY [facility].  HE IS ON RA DURING THE DAY AND VENT AT NIGHT IN WHICH HE CONNECTS.  PT STATED THAT HE HAS LOTS OF FAMILY SUPPORT AND WAS AMBULATORY AND CARING FOR HIMSELF UNTIL THIS HOSP STAY.  WILL ASK ATTENDING TO ORDER PT/OT ONCE HE IS MED STABLE.  WILL F/U ON DC NEEDS. Willa Rough 03/10/2011 [phone] OR [phone]

## 2011-03-10 NOTE — Progress Notes (Signed)
Chaplain made initial visit with patient. Patient stated that he was coping okay, but was exhausted from being up all night from arrival in the ED at 10 p.m. on 2/21 until arrival to 2900 at 6 a.m. on 2/22. Chaplain listened, offered emotional support, and prayed with patient. No follow up needed.

## 2011-03-10 NOTE — ED Provider Notes (Addendum)
History     CSN: [id number]  Arrival date & time 03/09/11  2232   First MD Initiated Contact with Patient 03/09/11 2352      Chief Complaint  Patient presents with  . Knee Pain    knee pain and increased weakness    (Consider location/radiation/quality/duration/timing/severity/associated sxs/prior treatment) Patient is a 66 y.o. male presenting with weakness. The history is provided by the patient, medical records, the spouse and the EMS personnel.  Weakness Primary symptoms do not include headaches, syncope, loss of consciousness, altered mental status, seizures, dizziness, visual change, paresthesias, focal weakness, loss of sensation, speech change, memory loss, fever, nausea or vomiting. The symptoms began 2 to 6 hours ago (The patient noticed the weakness when he tried to get up off the couch at approximately 7:00 PM and could not hold his weight up.). The episode lasted 5 hours. The symptoms are unchanged. The neurological symptoms are diffuse (Weakness mostly notable in the bilateral lower extremities equally). Context: No back pain, no nausea, vomiting, or diarrhea, no chest pain, no fever, no chills.  Additional symptoms include weakness. Additional symptoms do not include neck stiffness, pain, lower back pain, leg pain, loss of balance, photophobia, hallucinations, hearing loss, anxiety or irritability. Medical issues also include diabetes and hypertension. Medical issues do not include seizures, cerebral vascular accident, alcohol use, drug use or recent surgery.    Past Medical History  Diagnosis Date  . ANXIETY 07/23/2006  . ASTHMA 07/23/2006  . DM 05/23/2007  . HYPERTENSION 07/23/2006  . OSTEOARTHRITIS 07/23/2006  . CARCINOMA, PROSTATE 09/29/2009  . HYPERCHOLESTEROLEMIA 05/23/2007  . ANEMIA 08/31/2008  . Palpitations 02/10/2008  . ALCOHOL ABUSE, IN REMISSION, HX OF 08/26/2008  . TOBACCO ABUSE, HX OF 08/26/2008  . Pulmonary embolism 03/16/2010  . ED (erectile dysfunction)   . Morbid  obesity   . DM retinopathy   . 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 DM neuropathy with neurologic complication   . CHF (congestive heart failure)     With a preserved EF  . 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 Back surgery   . Tracheostomy     Family History  Problem Relation Age of Onset  . Cancer Neg Hx     History  Substance Use Topics  . Smoking status: Former Smoker    Quit date: 01/16/2005  . Smokeless tobacco: Not on file  . Alcohol Use: No      Review of Systems  Constitutional: Positive for fatigue. Negative for fever, chills, diaphoresis, appetite change and irritability.  HENT: Negative for hearing loss, ear pain, congestion, sore throat, rhinorrhea, drooling, trouble swallowing, neck pain, neck stiffness, voice change and postnasal drip.   Eyes: Negative for photophobia and visual disturbance.  Respiratory: Negative for cough, chest tightness, shortness of breath and wheezing.   Cardiovascular: Negative for chest pain, palpitations and syncope.  Gastrointestinal: Negative for nausea, vomiting, abdominal pain, diarrhea, constipation and abdominal distention.  Genitourinary: Negative.   Musculoskeletal: Negative for myalgias, back pain, joint swelling, arthralgias and gait problem.  Skin: Negative for color change, pallor, rash and wound.  Neurological: Positive for weakness. Negative for dizziness, tremors, speech change, focal weakness, seizures, loss of consciousness, syncope, facial asymmetry, speech difficulty, light-headedness, numbness, headaches, paresthesias and loss of balance.  Psychiatric/Behavioral: Negative for hallucinations, memory loss, behavioral problems, confusion and  altered mental status.    Allergies  Review of patient's allergies indicates no known allergies.  Home Medications   Current Outpatient Rx  Name  Route Sig Dispense Refill  . AMLODIPINE BESYLATE 10 MG PO TABS Oral Take 10 mg by mouth daily.    Marland Kitchen CITALOPRAM HYDROBROMIDE 40 MG PO TABS Oral Take 40 mg by mouth daily.    Marland Kitchen CLONIDINE HCL 0.2 MG PO TABS Oral Take 0.2 mg by mouth 3 (three) times daily.    . FUROSEMIDE 40 MG PO TABS Oral Take 40 mg by mouth 2 (two) times daily.    Marland Kitchen HYDROCHLOROTHIAZIDE 25 MG PO TABS Oral Take 25 mg by mouth daily.    . INSULIN GLARGINE 100 UNIT/ML [place] SOLN Subcutaneous Inject 50 Units into the skin every morning.    Marland Kitchen METOCLOPRAMIDE HCL 5 MG PO TABS Oral Take 5 mg by mouth 3 (three) times daily.    Marland Kitchen ONE-DAILY MULTI VITAMINS PO TABS Oral Take 1 tablet by mouth daily.      Marland Kitchen OLMESARTAN MEDOXOMIL 40 MG PO TABS Oral Take 40 mg by mouth daily.    . OXYCODONE HCL ER 15 MG PO TB12 Oral Take 15 mg by mouth 4 (four) times daily as needed. For pain.    Marland Kitchen POTASSIUM CHLORIDE CRYS ER 20 MEQ PO TBCR Oral Take 40 mEq by mouth daily.    Marland Kitchen SIMVASTATIN 20 MG PO TABS Oral Take 20 mg by mouth every evening.    . WARFARIN SODIUM 10 MG PO TABS Oral Take 5-10 mg by mouth daily. Take 1 tablet (10 MG) every day except Mondays and Thursdays. On Mondays and Thursdays take 0.5 tablets (5 MG)      BP 153/75  Pulse 79  Resp 18  SpO2 95%  Physical Exam  Nursing note and vitals reviewed. Constitutional: He is oriented to person, place, and time. He appears well-nourished. No distress.       Morbidly obese  HENT:  Head: Normocephalic and atraumatic.  Right Ear: Hearing, tympanic membrane and external ear normal.  Left Ear: Hearing, tympanic membrane, external ear and ear canal normal.  Nose: Nose normal.  Mouth/Throat: Uvula is midline, oropharynx is clear and moist and mucous membranes are normal. No oropharyngeal exudate.  Eyes: Conjunctivae and EOM are normal. Pupils are equal, round, and reactive to light.  Fundoscopic exam:      The right eye shows no AV nicking, no exudate and no hemorrhage.       The left eye shows no AV  nicking, no exudate and no hemorrhage.  Neck: Normal range of motion. Neck supple. No tracheal deviation present.       Due to the patient's morbid obesity, I am unable to evaluate for jugular venous distention. He has a tracheostomy in place without any secretions.  Cardiovascular: Normal rate, regular rhythm, S1 normal, S2 normal, normal heart sounds and intact distal pulses.   No extrasystoles are present. PMI is not displaced.  Exam reveals no gallop and no friction rub.   No murmur heard. Pulmonary/Chest: Effort normal and breath sounds normal. No accessory muscle usage or stridor. Not tachypneic. No respiratory distress. He has no decreased breath sounds. He has no wheezes. He has no rhonchi. He has no rales. He exhibits no tenderness, no bony tenderness, no crepitus and no retraction.  Abdominal: Soft. Bowel sounds are normal. He exhibits no distension and no mass. There is no tenderness. There is no rebound and no guarding.  Musculoskeletal:  Normal range of motion. He exhibits no edema and no tenderness.  Neurological: He is alert and oriented to person, place, and time. He displays normal reflexes. No cranial nerve deficit or sensory deficit. He exhibits normal muscle tone. Coordination normal. GCS eye subscore is 4. GCS verbal subscore is 5. GCS motor subscore is 6.       The patient has 4+ out of 5 strength bilaterally at the hip flexors, 5 out of 5 strength at the hip extensors, knee flexors and extensors, ankle flexors and extensors.  Skin: Skin is warm and dry. No rash noted. He is not diaphoretic. No erythema. No pallor.  Psychiatric: He has a normal mood and affect. His behavior is normal. Judgment and thought content normal.    ED Course  Procedures (including critical care time)   Date: 03/10/2011  Rate: 86  Rhythm: normal sinus rhythm and premature ventricular contractions (PVC)  QRS Axis: left  Intervals: normal  ST/T Wave abnormalities: nonspecific T wave changes and  Diffuse T-wave flattening  Conduction Disutrbances:none  Narrative Interpretation: Left axis deviation otherwise no significant changes  Old EKG Reviewed: changes noted    Labs Reviewed  CBC  DIFFERENTIAL  COMPREHENSIVE METABOLIC PANEL  LIPASE, BLOOD  PROTIME-INR  APTT  URINALYSIS, ROUTINE W REFLEX MICROSCOPIC   No results found.   No diagnosis found.    MDM  Anemia, electrolyte abnormality, hyperglycemia, acidosis, arrhythmia, myocardial infarction, pulmonary edema, congestive heart failure, urinary tract infection are all potential etiologies of the patient's symptoms considered amongst others. I do not suspect stroke in this patient as his symptoms are bilateral and equal in both lower extremities. This would not be compatible with a single ischemic injury.        Felisa Bonier, MD 03/10/11 0025  1:36 AM The patient's laboratory workup appears normal with the exception of elevated pro B. natriuretic peptide and a chest x-ray showing pulmonary vascular congestion, explaining his shortness of breath as congestive heart failure. I will give the patient an initial dose of IV Lasix in the emergency department and assess for improvement as well as assess for his ability to ambulate afterwards.  Felisa Bonier, MD 03/10/11 1610  Felisa Bonier, MD 03/10/11 [phone]

## 2011-03-10 NOTE — Progress Notes (Signed)
[facility] [place] TEAM 8  Subjective: 66 year-old male with history of diastolic CHF, OSA status post tracheostomy and uses ventilator at bedtime, history of PE versus on Coumadin, history of hypertension, and diabetes mellitus 2 presents to the ER because of increasing weakness for the last couple of days and getting short of breath with minimal exertion.  At the time of my evaluation the pt reports feeling much more comfortable, with signif less SOB.    Objective: Weight change:   Intake/Output Summary (Last 24 hours) at 03/10/11 0844 Last data filed at 03/10/11 0700  Gross per 24 hour  Intake    400 ml  Output   2476 ml  Net  -2076 ml   Blood pressure 166/78, pulse 90, temperature 98.4 F (36.9 C), temperature source Oral, resp. rate 16, height 6\' 2"  (1.88 m), weight 170.3 kg (375 lb 7.1 oz), SpO2 94.00%.  Physical Exam: F/U exam completed  Lab Results:  Basename 03/10/11 0500 03/10/11 0019  NA 141 142  K 4.0 4.4  CL 102 106  CO2 29 30  GLUCOSE 163* 60*  BUN 21 22  CREATININE 1.31 1.27  CALCIUM 10.0 9.9  MG 2.1 --  PHOS -- --    Basename 03/10/11 0500 03/10/11 0019  AST 17 15  ALT 9 10  ALKPHOS 85 77  BILITOT 0.3 0.2*  PROT 8.3 7.5  ALBUMIN 3.7 3.5    Basename 03/10/11 0500 03/10/11 0019  WBC 7.1 6.1  NEUTROABS -- 4.5  HGB 12.8* 11.5*  HCT 39.4 36.5*  MCV 88.9 89.9  PLT 192 168    Basename 03/10/11 0500  CKTOTAL 116  CKMB 2.3  CKMBINDEX --  TROPONINI <0.30   No results found for this basename: HGBA1C:12 in the last 72 hours  Micro Results: Recent Results (from the past 240 hour(s))  MRSA PCR SCREENING     Status: Abnormal   Collection Time   03/10/11  6:24 AM      Component Value Range Status Comment   MRSA by PCR POSITIVE (*) NEGATIVE  Final     Studies/Results: All recent x-ray/radiology reports have been reviewed in detail.   Medications: I have reviewed the patient's complete medication list.  Assessment/Plan:  Acute  exacerbation of chronic diastolic CHF Last echo in 2009 revealed EF 65% - followed by [facility]) - was well compensated at last visit (02/28/11)  OSA s/p trach w/ QHS vent  PCCM aware and wishes to see "as a consult" for vent monitoring  DM2  hx of PE Compliance has been a complicating issue - INR at goal   Obesity  HTN  Hx of cough on ACE Inhib  Remote hx of EtOH and tobacco abuse  Lonia Blood, MD [facility] Office  [phone] Pager [phone]  On-Call/Text Page:      [email or web address]      password [facility]

## 2011-03-10 NOTE — Progress Notes (Signed)
Utilization Review Completed.Delena Casebeer T2/22/2013   

## 2011-03-10 NOTE — ED Notes (Signed)
Pt needs vent at night to sleep and RT states step-down will not take pt who is on a vent.  Flow called to order pt bed on 2600 per Dr Toniann Fail.

## 2011-03-10 NOTE — ED Notes (Addendum)
PT unable to ambulate.  HR increased to 145 when pt stood up.  Pt needed to lean on chair in order to support himself and he did not have enough strength to ambulate.  Pt also continues with pvc's every 6 beats.    Pt with diminished LS and rhonci noted in post lung bases bil.  2+ pitting edema bil to LE.

## 2011-03-11 LAB — BASIC METABOLIC PANEL
CO2: 35 mEq/L — ABNORMAL HIGH (ref 19–32)
Calcium: 10.1 mg/dL (ref 8.4–10.5)
Glucose, Bld: 135 mg/dL — ABNORMAL HIGH (ref 70–99)
Potassium: 3.3 mEq/L — ABNORMAL LOW (ref 3.5–5.1)
Sodium: 140 mEq/L (ref 135–145)

## 2011-03-11 LAB — GLUCOSE, CAPILLARY: Glucose-Capillary: 147 mg/dL — ABNORMAL HIGH (ref 70–99)

## 2011-03-11 LAB — PROTIME-INR: Prothrombin Time: 19.7 seconds — ABNORMAL HIGH (ref 11.6–15.2)

## 2011-03-11 LAB — CBC
Hemoglobin: 12.5 g/dL — ABNORMAL LOW (ref 13.0–17.0)
MCH: 28.8 pg (ref 26.0–34.0)
MCV: 87.1 fL (ref 78.0–100.0)
Platelets: 173 10*3/uL (ref 150–400)
RBC: 4.34 MIL/uL (ref 4.22–5.81)
WBC: 6.7 10*3/uL (ref 4.0–10.5)

## 2011-03-11 MED ORDER — WARFARIN SODIUM 7.5 MG PO TABS
15.0000 mg | ORAL_TABLET | Freq: Once | ORAL | Status: AC
  Administered 2011-03-11: 15 mg via ORAL
  Filled 2011-03-11: qty 2

## 2011-03-11 MED ORDER — POTASSIUM CHLORIDE CRYS ER 20 MEQ PO TBCR
40.0000 meq | EXTENDED_RELEASE_TABLET | Freq: Two times a day (BID) | ORAL | Status: DC
  Administered 2011-03-11 – 2011-03-15 (×8): 40 meq via ORAL
  Filled 2011-03-11: qty 2
  Filled 2011-03-11: qty 1
  Filled 2011-03-11 (×9): qty 2

## 2011-03-11 MED ORDER — ENOXAPARIN SODIUM 150 MG/ML ~~LOC~~ SOLN
170.0000 mg | Freq: Two times a day (BID) | SUBCUTANEOUS | Status: DC
  Administered 2011-03-11 – 2011-03-12 (×3): 170 mg via SUBCUTANEOUS
  Filled 2011-03-11 (×7): qty 2

## 2011-03-11 NOTE — Progress Notes (Signed)
[facility] called back with pts. home ventilator settings as used @ nite: SIMV/rate-6/vt-700/fi02-28%/peep+5/pressure support+13, **need order for our Ventilator use @ nite while admitted to unit ** by Primary/Attending, Thanks, RT

## 2011-03-11 NOTE — Progress Notes (Signed)
ANTICOAGULATION CONSULT NOTE - Follow Up Consult  Pharmacy Consult for lovenox  Indication: hx PE  No Known Allergies  Patient Measurements: Height: 6\' 2"  (188 cm) Weight: 370 lb 6 oz (168 kg) IBW/kg (Calculated) : 82.2   Vital Signs: Temp: 98.8 F (37.1 C) (02/23 1200) Temp src: Oral (02/23 1200) BP: 123/83 mmHg (02/23 1331) Pulse Rate: 71  (02/23 1200)  Labs:  Basename 03/11/11 0415 03/10/11 2200 03/10/11 1520 03/10/11 0500 03/10/11 0019  HGB 12.5* -- -- 12.8* --  HCT 37.8* -- -- 39.4 36.5*  PLT 173 -- -- 192 168  APTT -- -- -- -- 39*  LABPROT 19.7* -- -- -- 23.7*  INR 1.64* -- -- -- 2.07*  HEPARINUNFRC -- -- -- -- --  CREATININE 1.34 -- -- 1.31 1.27  CKTOTAL -- 141 118 116 --  CKMB -- 2.0 2.0 2.3 --  TROPONINI -- <0.30 <0.30 <0.30 --   Estimated Creatinine Clearance: 90.6 ml/min (by C-G formula based on Cr of 1.34).  Assessment: 66 yom on coumadin for history of PE. INR today is subtherapeutic. To start enoxaparin until INR is consistently at goal INR for 48 hours. Pt is obese so may need to consider checking anti-Xa levels to ensure the patient is getting a therapeutic dose.   Goal of Therapy:  INR 2-3 Anti-Xa 0.6-1.2   Plan:  1. Lovenox 170mg  SQ Q12H 2. CBC Q72H while on lovenox 3. F/u INR (continue lovenox until coumadin consistently at goal x 48 hours) 4. Consider an anti-Xa level due to patient size  Rhodesia Stanger, Drake Leach 03/11/2011,4:12 PM

## 2011-03-11 NOTE — Progress Notes (Signed)
03/11/2011: Pt. and family stated that existing Trach,(Shiley 7.0 cuffed XLT Proximal),  was scheduled to be changed Tuesday, February 19 th @ Physicians office in [place], have brand/model/size available to be done while in unit if ok'd with primary/attending, also need order for Nocturnal ventilation while in unit, pt. uses home ventilator h/s. Thanks.

## 2011-03-11 NOTE — Progress Notes (Signed)
Pt attempted to get OOB by himself. Pt did not call for help even though the call light was within reach. Pt is A&Ox 4 and does not have any sign of memory impairment. Pt is helped to the chair and is reminded to call for assistance and pt verbalized understanding.

## 2011-03-11 NOTE — Progress Notes (Signed)
PHARMACY - ANTICOAGULATION CONSULT INITIAL NOTE  Pharmacy Consult for: Coumadin  Indication: H/o pulmonary embolism     Patient Data:   Allergies: No Known Allergies  Patient Measurements: Height: 6\' 2"  (188 cm) Weight: 370 lb 6 oz (168 kg) IBW/kg (Calculated) : 82.2    Vital Signs: Temp:  [98 F (36.7 C)-99.2 F (37.3 C)] 99.2 F (37.3 C) (02/23 0422) Pulse Rate:  [68-86] 78  (02/23 0800) Resp:  [13-24] 24  (02/23 0800) BP: (115-156)/(61-92) 115/82 mmHg (02/23 0800) SpO2:  [93 %-100 %] 98 % (02/23 0800) FiO2 (%):  [28 %] 28 % (02/23 0800) Weight:  [365 lb 15.4 oz (166 kg)-370 lb 6 oz (168 kg)] 370 lb 6 oz (168 kg) (02/23 0445)  Intake/Output from previous day:  Intake/Output Summary (Last 24 hours) at 03/11/11 0818 Last data filed at 03/11/11 0424  Gross per 24 hour  Intake    818 ml  Output   5075 ml  Net  -4257 ml    Labs:  Basename 03/11/11 0415 03/10/11 2200 03/10/11 1520 03/10/11 0500 03/10/11 0019  HGB 12.5* -- -- 12.8* --  HCT 37.8* -- -- 39.4 36.5*  PLT 173 -- -- 192 168  APTT -- -- -- -- 39*  LABPROT 19.7* -- -- -- 23.7*  INR 1.64* -- -- -- 2.07*  HEPARINUNFRC -- -- -- -- --  CREATININE 1.34 -- -- 1.31 1.27  CKTOTAL -- 141 118 116 --  CKMB -- 2.0 2.0 2.3 --  TROPONINI -- <0.30 <0.30 <0.30 --   Estimated Creatinine Clearance: 90.6 ml/min (by C-G formula based on Cr of 1.34).  Medical History: Past Medical History  Diagnosis Date  . ANXIETY 07/23/2006  . ASTHMA 07/23/2006  . DM 05/23/2007  . HYPERTENSION 07/23/2006  . OSTEOARTHRITIS 07/23/2006  . HYPERCHOLESTEROLEMIA 05/23/2007  . ANEMIA 08/31/2008  . Palpitations 02/10/2008  . ALCOHOL ABUSE, IN REMISSION, HX OF 08/26/2008  . TOBACCO ABUSE, HX OF 08/26/2008  . Pulmonary embolism 03/16/2010  . ED (erectile dysfunction)   . Morbid obesity   . DM retinopathy   . 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 DM neuropathy with neurologic complication   . CHF (congestive heart failure)      With a preserved EF  . 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 Sleep apnea   . Shortness of breath   . CARCINOMA, PROSTATE 09/29/2009    Scheduled medications:     . ALPRAZolam  1 mg Oral TID  . antiseptic oral rinse  15 mL Mouth Rinse QID  . carvedilol  6.25 mg Oral BID WC  . chlorhexidine  15 mL Mouth Rinse BID  . Chlorhexidine Gluconate Cloth  6 each Topical Q0600  . citalopram  20 mg Oral Daily  . cloNIDine  0.2 mg Oral Q8H  . furosemide  60 mg Intravenous Q8H  . hydrochlorothiazide  25 mg Oral Daily  . insulin aspart  0-9 Units Subcutaneous TID WC  . insulin glargine  50 Units Subcutaneous q morning - 10a  . metoCLOPramide  5 mg Oral TID WC  . mulitivitamin with minerals  1 tablet Oral Daily  . mupirocin ointment  1 application Nasal BID  . olmesartan  40 mg Oral Daily  . pneumococcal 23 valent vaccine  0.5 mL Intramuscular Once  . potassium chloride SA  40 mEq Oral Daily  . simvastatin  20  mg Oral QPM  . sodium chloride  3 mL Intravenous Q12H  . sodium chloride  3 mL Intravenous Q12H  . warfarin  10 mg Oral Custom  . warfarin  5 mg Oral Custom  . DISCONTD: aliskiren  75 mg Oral Daily  . DISCONTD: amLODipine  10 mg Oral Daily  . DISCONTD: citalopram  40 mg Oral Daily     Assessment:  66 y.o. male admitted on 03/09/2011, with h/o pulmonary embolism in February 2012. Pharmacy consulted to manage Coumadin. INR 1.64<---2.07, received 10 mg yesterday per home dose. PTA patient takes Coumadin 10mg  daily, except 5mg  on Monday and Thursdays.  Goal of Therapy:  1. INR 2-3  Plan:  1. Supplement daily dose with 5 mg Coumadin tonight @1800  (total 15 mg today), and continue regularly scheduled dose thereafter. 2. Continue home Coumadin regimen of 10mg  daily, except 5mg  on Monday and Thursdays. 3. Daily PT / INR.  [place] R. Ki Corbo, PharmD 03/11/2011, 8:18 AM

## 2011-03-11 NOTE — Progress Notes (Signed)
[facility] [place] TEAM 8  Subjective: 66 year-old male with history of diastolic CHF, OSA status post tracheostomy and uses ventilator at bedtime, history of PE on Coumadin, history of hypertension, and diabetes mellitus 2 presents to the ER because of increasing weakness for the last couple of days and getting short of breath with minimal exertion.  The pt remains comfortable today.  He has no new complaints today.    Objective: Weight change: -4.3 kg (-9 lb 7.7 oz)  Intake/Output Summary (Last 24 hours) at 03/11/11 1559 Last data filed at 03/11/11 1200  Gross per 24 hour  Intake    616 ml  Output   3075 ml  Net  -2459 ml   Blood pressure 123/83, pulse 71, temperature 98.8 F (37.1 C), temperature source Oral, resp. rate 28, height 6\' 2"  (1.88 m), weight 168 kg (370 lb 6 oz), SpO2 97.00%.  Physical Exam: General: No acute respiratory distress-resting comfortably  Lungs: Clear to auscultation bilaterally without wheezes or crackles-distant BS  Cardiovascular: Regular rate and rhythm without murmur gallop or rub - heart sounds are quite distant Abdomen: Nontender, nondistended, soft, bowel sounds positive, no rebound, no ascites, no appreciable mass - morbidly obese Extremities: 2+ B LE edema, but improved since exam yesterday  Lab Results:  Basename 03/11/11 0415 03/10/11 0500 03/10/11 0019  NA 140 141 142  K 3.3* 4.0 4.4  CL 97 102 106  CO2 35* 29 30  GLUCOSE 135* 163* 60*  BUN 19 21 22   CREATININE 1.34 1.31 1.27  CALCIUM 10.1 10.0 9.9  MG -- 2.1 --  PHOS -- -- --    Basename 03/10/11 0500 03/10/11 0019  AST 17 15  ALT 9 10  ALKPHOS 85 77  BILITOT 0.3 0.2*  PROT 8.3 7.5  ALBUMIN 3.7 3.5    Basename 03/11/11 0415 03/10/11 0500 03/10/11 0019  WBC 6.7 7.1 6.1  NEUTROABS -- -- 4.5  HGB 12.5* 12.8* 11.5*  HCT 37.8* 39.4 36.5*  MCV 87.1 88.9 89.9  PLT 173 192 168    Basename 03/10/11 2200 03/10/11 1520 03/10/11 0500  CKTOTAL 141 118 116  CKMB 2.0  2.0 2.3  CKMBINDEX -- -- --  TROPONINI <0.30 <0.30 <0.30    Basename 03/10/11 0500  HGBA1C 6.0*    Micro Results: Recent Results (from the past 240 hour(s))  MRSA PCR SCREENING     Status: Abnormal   Collection Time   03/10/11  6:24 AM      Component Value Range Status Comment   MRSA by PCR POSITIVE (*) NEGATIVE  Final     Studies/Results: All recent x-ray/radiology reports have been reviewed in detail.   Medications: I have reviewed the patient's complete medication list.  Assessment/Plan:  Acute exacerbation of chronic diastolic CHF Last echo in 2009 revealed EF 65% - followed by Corinda [facility] [place]) - was well compensated at last visit (02/28/11) - fluid balance is -~7L since admit - cont to diurese - watch renal fxn and BP  OSA s/p trach w/ QHS vent  PCCM aware and to see PRN for vent monitoring - will resume home vent settings as clarified by RT - discuss trach change with PCCM   hypokalemia Due to diuresis - increase replacement and follow  DM2 Reasonably well controlled at present   hx of PE Compliance has been a complicating issue - Pharmacy to manage tx while in hospital  Obesity  HTN Well controlled at present   Hx of cough on ACE Inhib  Is tolerating an ARB w/o difficulty at present  Remote hx of EtOH and tobacco abuse Denies current use of either  Lonia Blood, MD [facility] Office  [phone] Pager [phone]  On-Call/Text Page:      [email or web address]      password [facility]

## 2011-03-12 LAB — CBC
HCT: 37.4 % — ABNORMAL LOW (ref 39.0–52.0)
Hemoglobin: 12.1 g/dL — ABNORMAL LOW (ref 13.0–17.0)
MCH: 28.7 pg (ref 26.0–34.0)
MCHC: 32.4 g/dL (ref 30.0–36.0)
MCV: 88.6 fL (ref 78.0–100.0)
Platelets: 180 10*3/uL (ref 150–400)
RBC: 4.22 MIL/uL (ref 4.22–5.81)
RDW: 13.2 % (ref 11.5–15.5)
WBC: 6.3 10*3/uL (ref 4.0–10.5)

## 2011-03-12 LAB — BASIC METABOLIC PANEL
BUN: 28 mg/dL — ABNORMAL HIGH (ref 6–23)
CO2: 36 mEq/L — ABNORMAL HIGH (ref 19–32)
Calcium: 9.5 mg/dL (ref 8.4–10.5)
Chloride: 96 mEq/L (ref 96–112)
Creatinine, Ser: 1.59 mg/dL — ABNORMAL HIGH (ref 0.50–1.35)

## 2011-03-12 LAB — GLUCOSE, CAPILLARY: Glucose-Capillary: 155 mg/dL — ABNORMAL HIGH (ref 70–99)

## 2011-03-12 LAB — PROTIME-INR
INR: 2.07 — ABNORMAL HIGH (ref 0.00–1.49)
Prothrombin Time: 23.7 seconds — ABNORMAL HIGH (ref 11.6–15.2)

## 2011-03-12 MED ORDER — FUROSEMIDE 40 MG PO TABS
40.0000 mg | ORAL_TABLET | Freq: Two times a day (BID) | ORAL | Status: DC
  Administered 2011-03-12 – 2011-03-13 (×2): 40 mg via ORAL
  Filled 2011-03-12 (×5): qty 1

## 2011-03-12 MED ORDER — ALPRAZOLAM 0.25 MG PO TABS
0.5000 mg | ORAL_TABLET | Freq: Three times a day (TID) | ORAL | Status: DC
  Administered 2011-03-12 – 2011-03-17 (×14): 0.5 mg via ORAL
  Filled 2011-03-12 (×14): qty 2

## 2011-03-12 MED ORDER — CLONIDINE HCL 0.1 MG PO TABS
0.1000 mg | ORAL_TABLET | Freq: Three times a day (TID) | ORAL | Status: DC
  Administered 2011-03-12 – 2011-03-13 (×3): 0.1 mg via ORAL
  Filled 2011-03-12 (×6): qty 1

## 2011-03-12 MED ORDER — OLMESARTAN MEDOXOMIL 20 MG PO TABS
20.0000 mg | ORAL_TABLET | Freq: Every day | ORAL | Status: DC
  Administered 2011-03-13 – 2011-03-17 (×5): 20 mg via ORAL
  Filled 2011-03-12 (×6): qty 1

## 2011-03-12 MED ORDER — WARFARIN SODIUM 10 MG PO TABS
10.0000 mg | ORAL_TABLET | ORAL | Status: DC
  Administered 2011-03-12: 10 mg via ORAL
  Filled 2011-03-12 (×2): qty 1

## 2011-03-12 MED ORDER — CARVEDILOL 3.125 MG PO TABS
3.1250 mg | ORAL_TABLET | Freq: Two times a day (BID) | ORAL | Status: DC
  Administered 2011-03-12 – 2011-03-13 (×2): 3.125 mg via ORAL
  Filled 2011-03-12 (×4): qty 1

## 2011-03-12 NOTE — Progress Notes (Signed)
[facility] [place] TEAM 8  Subjective: 66 year-old male with history of diastolic CHF, OSA status post tracheostomy and uses ventilator at bedtime, history of PE on Coumadin, history of hypertension, and diabetes mellitus 2 presents to the ER because of increasing weakness for the last couple of days and getting short of breath with minimal exertion.  The patient denies fevers chills nausea vomiting or chest pain today.  For the first time today the patient reports he is having severe weakness in both legs.  He states that this is the "problem that brought him into the emergency room" though this is the first time he is mentioning it to me.  He describes severe weakness in both legs that was slowly progressive but then acutely picked up speed last Thursday.  He states that prior to Thursday he was able to get around with extreme difficulty but that since Thursday his legs have been "so weak I can't stand on them".  Objective: Weight change: -0.8 kg (-1 lb 12.2 oz)  Intake/Output Summary (Last 24 hours) at 03/12/11 1624 Last data filed at 03/12/11 1500  Gross per 24 hour  Intake    240 ml  Output   1450 ml  Net  -1210 ml   Blood pressure 91/58, pulse 73, temperature 98.7 F (37.1 C), temperature source Oral, resp. rate 16, height 6\' 2"  (1.88 m), weight 165.2 kg (364 lb 3.2 oz), SpO2 93.00%.  Physical Exam: General: No acute respiratory distress-resting comfortably  Lungs: Clear to auscultation bilaterally without wheezes or crackles-distant BS throughout Cardiovascular: Regular rate and rhythm without murmur gallop or rub - heart sounds are quite distant Abdomen: Nontender, nondistended, soft, bowel sounds positive, no rebound, no ascites, no appreciable mass - morbidly obese Extremities: 1+ B LE edema Neuro:  4+/5 strength bilateral lower extremities - no appreciable hyperreflexia  Lab Results:  Basename 03/12/11 0520 03/11/11 0415 03/10/11 0500  NA 141 140 141  K 3.5 3.3*  4.0  CL 96 97 102  CO2 36* 35* 29  GLUCOSE 115* 135* 163*  BUN 28* 19 21  CREATININE 1.59* 1.34 1.31  CALCIUM 9.5 10.1 10.0  MG -- -- 2.1  PHOS -- -- --    Basename 03/10/11 0500 03/10/11 0019  AST 17 15  ALT 9 10  ALKPHOS 85 77  BILITOT 0.3 0.2*  PROT 8.3 7.5  ALBUMIN 3.7 3.5    Basename 03/12/11 0520 03/11/11 0415 03/10/11 0500 03/10/11 0019  WBC 6.3 6.7 7.1 --  NEUTROABS -- -- -- 4.5  HGB 12.1* 12.5* 12.8* --  HCT 37.4* 37.8* 39.4 --  MCV 88.6 87.1 88.9 --  PLT 180 173 192 --    Basename 03/10/11 2200 03/10/11 1520 03/10/11 0500  CKTOTAL 141 118 116  CKMB 2.0 2.0 2.3  CKMBINDEX -- -- --  TROPONINI <0.30 <0.30 <0.30    Basename 03/10/11 0500  HGBA1C 6.0*    Micro Results: Recent Results (from the past 240 hour(s))  MRSA PCR SCREENING     Status: Abnormal   Collection Time   03/10/11  6:24 AM      Component Value Range Status Comment   MRSA by PCR POSITIVE (*) NEGATIVE  Final     Studies/Results: All recent x-ray/radiology reports have been reviewed in detail.   Medications: I have reviewed the patient's complete medication list.  Assessment/Plan:  Acute exacerbation of chronic diastolic CHF Last echo in 2009 revealed EF 65% - followed by [facility] ([place]) - was well compensated at  last visit (02/28/11) - fluid balance is -8L since admit - renal function and blood pressure suggest that the patient is becoming volume depleted - we will slow diuresis  OSA s/p trach w/ QHS vent  PCCM aware and to see PRN for vent monitoring - have resumed home vent settings as clarified by RT - discuss trach change with PCCM on Monday  hypokalemia Due to diuresis - improving with replacement  DM2 Reasonably well controlled at present   Bilateral lower extremity weakness It's possible that this is simply related to the excessive weight due to the marked lower extremity edema present at admission - also, given his obesity, the possibility of a herniated nucleus  pulposus is quite high - unfortunately I suspect that imaging this area will be quite difficult - I find no evidence to suggest an acute cord compression - we will attempt to advance with physical therapy and occupational therapy and monitor  hx of PE Compliance has been a complicating issue - Pharmacy to manage tx while in hospital  Obesity  HTN Well controlled at present   Hx of cough on ACE Inhib Is tolerating an ARB w/o difficulty at present  Remote hx of EtOH and tobacco abuse Denies current use of either  Lonia Blood, MD [facility] Office  [phone] Pager [phone]  On-Call/Text Page:      [email or web address]      password [facility]

## 2011-03-12 NOTE — Progress Notes (Signed)
PHARMACY - ANTICOAGULATION CONSULT INITIAL NOTE  Pharmacy Consult for: Coumadin and Lovenox  Indication: H/o pulmonary embolism     Patient Data:   Allergies: No Known Allergies  Patient Measurements: Height: 6\' 2"  (188 cm) Weight: 364 lb 3.2 oz (165.2 kg) IBW/kg (Calculated) : 82.2    Vital Signs: Temp:  [97.1 F (36.2 C)-98.9 F (37.2 C)] 98.2 F (36.8 C) (02/24 0729) Pulse Rate:  [62-78] 62  (02/24 0800) Resp:  [11-28] 12  (02/24 0800) BP: (100-133)/(46-91) 114/80 mmHg (02/24 0800) SpO2:  [97 %-100 %] 100 % (02/24 0800) FiO2 (%):  [28 %-91.1 %] 91.1 % (02/24 0800) Weight:  [364 lb 3.2 oz (165.2 kg)] 364 lb 3.2 oz (165.2 kg) (02/24 0351)  Intake/Output from previous day:  Intake/Output Summary (Last 24 hours) at 03/12/11 0847 Last data filed at 03/12/11 0354  Gross per 24 hour  Intake    600 ml  Output   1750 ml  Net  -1150 ml    Labs:  Basename 03/12/11 0520 03/11/11 0415 03/10/11 2200 03/10/11 1520 03/10/11 0500 03/10/11 0019  HGB 12.1* 12.5* -- -- -- --  HCT 37.4* 37.8* -- -- 39.4 --  PLT 180 173 -- -- 192 --  APTT -- -- -- -- -- 39*  LABPROT 23.7* 19.7* -- -- -- 23.7*  INR 2.07* 1.64* -- -- -- 2.07*  HEPARINUNFRC -- -- -- -- -- --  CREATININE 1.59* 1.34 -- -- 1.31 --  CKTOTAL -- -- 141 118 116 --  CKMB -- -- 2.0 2.0 2.3 --  TROPONINI -- -- <0.30 <0.30 <0.30 --   Estimated Creatinine Clearance: 75.6 ml/min (by C-G formula based on Cr of 1.59).  Medical History: Past Medical History  Diagnosis Date  . ANXIETY 07/23/2006  . ASTHMA 07/23/2006  . DM 05/23/2007  . HYPERTENSION 07/23/2006  . OSTEOARTHRITIS 07/23/2006  . HYPERCHOLESTEROLEMIA 05/23/2007  . ANEMIA 08/31/2008  . Palpitations 02/10/2008  . ALCOHOL ABUSE, IN REMISSION, HX OF 08/26/2008  . TOBACCO ABUSE, HX OF 08/26/2008  . Pulmonary embolism 03/16/2010  . ED (erectile dysfunction)   . Morbid obesity   . DM retinopathy   . 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 DM neuropathy  with neurologic complication   . CHF (congestive heart failure)     With a preserved EF  . 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 Sleep apnea   . Shortness of breath   . CARCINOMA, PROSTATE 09/29/2009    Scheduled medications:     . ALPRAZolam  1 mg Oral TID  . antiseptic oral rinse  15 mL Mouth Rinse QID  . carvedilol  6.25 mg Oral BID WC  . chlorhexidine  15 mL Mouth Rinse BID  . Chlorhexidine Gluconate Cloth  6 each Topical Q0600  . citalopram  20 mg Oral Daily  . cloNIDine  0.2 mg Oral Q8H  . enoxaparin (LOVENOX) injection  170 mg Subcutaneous Q12H  . furosemide  60 mg Intravenous Q8H  . hydrochlorothiazide  25 mg Oral Daily  . insulin aspart  0-9 Units Subcutaneous TID WC  . insulin glargine  50 Units Subcutaneous q morning - 10a  . metoCLOPramide  5 mg Oral TID WC  . mulitivitamin with minerals  1 tablet Oral Daily  . mupirocin ointment  1 application Nasal BID  . olmesartan  40 mg Oral Daily  . potassium chloride SA  40 mEq Oral BID  . simvastatin  20 mg Oral QPM  . sodium chloride  3 mL Intravenous Q12H  . sodium chloride  3 mL Intravenous Q12H  . warfarin  15 mg Oral ONCE-1800  . warfarin  5 mg Oral Custom  . DISCONTD: potassium chloride SA  40 mEq Oral Daily     Assessment:  66 y.o. male admitted on 03/09/2011, with h/o pulmonary embolism in February 2012. Pharmacy consulted to manage Coumadin. INR 2.07<---1.64<---2.07, received 10 mg 2/22 and 15 mg 2/23. PTA patient takes Coumadin 10mg  daily, except 5mg  on Monday and Thursdays. Will continue with home dose. MD started Lovenox 2/23 until INR therapeutic x48 hours, dosed at 170 mg sq q12h. Will continue until INR 2-3 x48 hours.  Goal of Therapy:  1. INR 2-3  Plan:  1. Resume home dose Coumadin of 10 mg daily except 5 mg on Monday and Thursday 2. Daily PT / INR.  [place] R. Tannie Koskela, PharmD 03/12/2011, 8:47 AM

## 2011-03-13 DIAGNOSIS — I472 Ventricular tachycardia, unspecified: Secondary | ICD-10-CM

## 2011-03-13 DIAGNOSIS — I4729 Other ventricular tachycardia: Secondary | ICD-10-CM | POA: Diagnosis present

## 2011-03-13 DIAGNOSIS — I493 Ventricular premature depolarization: Secondary | ICD-10-CM | POA: Diagnosis present

## 2011-03-13 LAB — BASIC METABOLIC PANEL
BUN: 40 mg/dL — ABNORMAL HIGH (ref 6–23)
CO2: 34 mEq/L — ABNORMAL HIGH (ref 19–32)
Calcium: 9.4 mg/dL (ref 8.4–10.5)
Chloride: 95 mEq/L — ABNORMAL LOW (ref 96–112)
Creatinine, Ser: 1.9 mg/dL — ABNORMAL HIGH (ref 0.50–1.35)
GFR calc Af Amer: 45 mL/min — ABNORMAL LOW (ref >90)
GFR calc non Af Amer: 39 mL/min — ABNORMAL LOW (ref >90)
Potassium: 3.8 mEq/L (ref 3.5–5.1)
Sodium: 136 mEq/L (ref 135–145)

## 2011-03-13 LAB — GLUCOSE, CAPILLARY: Glucose-Capillary: 199 mg/dL — ABNORMAL HIGH (ref 70–99)

## 2011-03-13 LAB — MAGNESIUM: Magnesium: 2.3 mg/dL (ref 1.5–2.5)

## 2011-03-13 MED ORDER — CARVEDILOL 6.25 MG PO TABS
6.2500 mg | ORAL_TABLET | Freq: Two times a day (BID) | ORAL | Status: DC
  Administered 2011-03-14 – 2011-03-17 (×6): 6.25 mg via ORAL
  Filled 2011-03-13 (×10): qty 1

## 2011-03-13 MED ORDER — CLONIDINE HCL 0.1 MG PO TABS
0.1000 mg | ORAL_TABLET | Freq: Two times a day (BID) | ORAL | Status: DC
  Administered 2011-03-14 – 2011-03-17 (×7): 0.1 mg via ORAL
  Filled 2011-03-13 (×10): qty 1

## 2011-03-13 NOTE — Progress Notes (Signed)
Physical Therapy Evaluation Patient Details Name: Travis Chapman MRN: [id number] DOB: 1945/03/07 Today's Date: 03/13/2011  Problem List:  Patient Active Problem List  Diagnoses  . CARCINOMA, PROSTATE  . DM  . HYPERCHOLESTEROLEMIA  . ANEMIA  . ANXIETY  . HYPERTENSION  . CARDIOMYOPATHY  . ASTHMA  . OSTEOARTHRITIS  . SHOULDER PAIN, BILATERAL  . KNEE PAIN  . WEIGHT GAIN  . PALPITATIONS  . HYPOVENTILATION  . PROTEINURIA, MILD  . NONSPECIFIC ABNORMAL ELECTROCARDIOGRAM  . ALCOHOL ABUSE, IN REMISSION, HX OF  . TOBACCO ABUSE, HX OF  . Pulmonary embolism  . Diastolic HF (heart failure)  . CHF (congestive heart failure)  . Weakness    Past Medical History:  Past Medical History  Diagnosis Date  . ANXIETY 07/23/2006  . ASTHMA 07/23/2006  . DM 05/23/2007  . HYPERTENSION 07/23/2006  . OSTEOARTHRITIS 07/23/2006  . HYPERCHOLESTEROLEMIA 05/23/2007  . ANEMIA 08/31/2008  . Palpitations 02/10/2008  . ALCOHOL ABUSE, IN REMISSION, HX OF 08/26/2008  . TOBACCO ABUSE, HX OF 08/26/2008  . Pulmonary embolism 03/16/2010  . ED (erectile dysfunction)   . Morbid obesity   . DM retinopathy   . 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 DM neuropathy with neurologic complication   . CHF (congestive heart failure)     With a preserved EF  . 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 Sleep apnea   . Shortness of breath   . CARCINOMA, PROSTATE 09/29/2009   Past Surgical History:  Past Surgical History  Procedure Date  . Back surgery   . Tracheostomy     PT Assessment/Plan/Recommendation PT Assessment Clinical Impression Statement: Pt is a 66 y/o male admitted with severe weakness as well as CHF along with the below PT problem list.  Pt would benefit from acute PT to maximize independence and facilitate d/c to CIR. PT Recommendation/Assessment: Patient will need skilled PT in the acute care venue PT Problem List: Decreased  strength;Decreased activity tolerance;Decreased balance;Decreased mobility Barriers to Discharge: None PT Therapy Diagnosis : Generalized weakness;Difficulty walking PT Plan PT Frequency: Min 3X/week PT Treatment/Interventions: DME instruction;Gait training;Functional mobility training;Therapeutic activities;Therapeutic exercise;Balance training;Patient/family education PT Recommendation Recommendations for Other Services: Rehab consult Follow Up Recommendations: Inpatient Rehab Equipment Recommended: Defer to next venue PT Goals  Acute Rehab PT Goals PT Goal Formulation: With patient/family Time For Goal Achievement: 2 weeks Pt will go Supine/Side to Sit: with min assist PT Goal: Supine/Side to Sit - Progress: Goal set today Pt will go Sit to Supine/Side: with min assist PT Goal: Sit to Supine/Side - Progress: Goal set today Pt will go Sit to Stand: with min assist PT Goal: Sit to Stand - Progress: Goal set today Pt will go Stand to Sit: with min assist PT Goal: Stand to Sit - Progress: Goal set today Pt will Ambulate: 16 - 50 feet;with min assist;with least restrictive assistive device PT Goal: Ambulate - Progress: Goal set today Pt will Perform Home Exercise Program: Independently PT Goal: Perform Home Exercise Program - Progress: Goal set today  PT Evaluation Precautions/Restrictions  Precautions Precautions: Fall Required Braces or Orthoses: No Restrictions Weight Bearing Restrictions: No Prior Functioning  Home Living Lives With: Family;Other (Comment) (Mother.) Receives Help From: Family Type of Home: House Home Layout: One level Home Access: Stairs to enter Entrance Stairs-Rails: Right;Left;Can reach both Entrance Stairs-Number of Steps: 2 Bathroom Shower/Tub: Forensic scientist: Standard Home  Adaptive Equipment: Straight cane;Walker - standard Additional Comments: states someone brought him a w/c but he is not sure he fits into it. Prior  Function Level of Independence: Independent with basic ADLs;Independent with gait;Independent with transfers;Needs assistance with homemaking Homemaking Assistance Comments: Pt's 55 year old mother cooks and does all cleaning. Able to Take Stairs?: Yes Driving: Yes Vocation: On disability Leisure: Hobbies-no Comments: Pt drives and visits friends. Cognition Cognition Arousal/Alertness: Awake/alert Overall Cognitive Status: Appears within functional limits for tasks assessed Cognition - Other Comments: Intact. Sensation/Coordination Sensation Light Touch: Appears Intact Stereognosis: Not tested Hot/Cold: Not tested Proprioception: Not tested Coordination Gross Motor Movements are Fluid and Coordinated: Yes Fine Motor Movements are Fluid and Coordinated: No Extremity Assessment RUE Assessment RUE Assessment: Within Functional Limits LUE Assessment LUE Assessment: Within Functional Limits RLE Assessment RLE Assessment: Exceptions to [facility] RLE Strength RLE Overall Strength: Deficits RLE Overall Strength Comments: 4/5 LLE Assessment LLE Assessment: Exceptions to [facility] LLE Strength LLE Overall Strength: Deficits LLE Overall Strength Comments: 3+/5 Pain "My knees always bother me."  Pt repositioned. Mobility (including Balance) Bed Mobility Bed Mobility: No Transfers Transfers: Yes Sit to Stand: 1: +2 Total assist;Patient percentage (comment);With upper extremity assist;From chair/3-in-1 ((pt=50%); 2 trials.) Sit to Stand Details (indicate cue type and reason): Assist to translate trunk anterior with facilitation at sacrum/ischials to raise superiorly while blocking bilateral knees to prevent buckling.  Cues for sequence. Stand to Sit: 1: +2 Total assist;Patient percentage (comment);With upper extremity assist;To chair/3-in-1 ((pt=50%); 2 trials.) Stand to Sit Details: Assist to control eccentric descent to chair at sacrum while blocking bilateral knees to prevent buckling.  Cues  for sequence. Ambulation/Gait Ambulation/Gait: No Stairs: No Wheelchair Mobility Wheelchair Mobility: No  Posture/Postural Control Posture/Postural Control: No significant limitations Balance Balance Assessed: Yes Static Standing Balance Static Standing - Balance Support: No upper extremity supported Static Standing - Level of Assistance: 1: +2 Total assist;Patient percentage (comment) ((pt=65%)) Static Standing - Comment/# of Minutes: Pt able to stand 5 seconds x 2 trials with 2 person assist for balance and due to weakness.  Blocked bilateral knees throughout to prevent buckling.  Cues to extend at knees, hips, and trunk. End of Session PT - End of Session Activity Tolerance: Patient tolerated treatment well Patient left: in chair;with call bell in reach;with family/visitor present Nurse Communication: Mobility status for transfers General Behavior During Session: [facility] for tasks performed Cognition: [facility] for tasks performed  Cephus Shelling 03/13/2011, 10:51 AM  03/13/2011 Cephus Shelling, PT, DPT [phone]

## 2011-03-13 NOTE — Progress Notes (Signed)
[facility] [employer] TEAM 8  Subjective: 66 year-old male with history of diastolic CHF, OSA status post tracheostomy and uses ventilator at bedtime, history of PE on Coumadin, history of hypertension, and diabetes mellitus 2 presents to the ER because of increasing weakness for the last couple of days and getting short of breath with minimal exertion.  Endorsed on 03/12/2011 had developed severe bilateral extremity weakness last Thursday. Prior to that was able to walk as well as drop motor vehicle. Today he continues to endorse similar symptoms. As noted by physical therapy as well as occupational therapy with significant difficulty with ambulating do to same weakness. Also has mild lumbar back pain.  Objective: Weight change: -0.2 kg (-7.1 oz)  Intake/Output Summary (Last 24 hours) at 03/13/11 1258 Last data filed at 03/13/11 1000  Gross per 24 hour  Intake    590 ml  Output    950 ml  Net   -360 ml   Blood pressure 123/92, pulse 77, temperature 98.4 F (36.9 C), temperature source Oral, resp. rate 21, height 6\' 2"  (1.88 m), weight 165 kg (363 lb 12.1 oz), SpO2 94.00%.  Physical Exam: General: No acute respiratory distress-resting comfortably. Able to vocalize by placing finger over opening of tracheostomy tube Lungs: Clear to auscultation bilaterally without wheezes or crackles-distant BS throughout, midline cuffed trach patent- trach collar in place (has a [place]iley 7.0 cuffed XLT Proximal trach tube) Cardiovascular: Regular rate and rhythm without murmur gallop or rub - heart sounds are quite distant Abdomen: Nontender, nondistended, soft, bowel sounds positive, no rebound, no ascites, no appreciable mass - morbidly obese Extremities: 1+ B LE edema Neuro:  4+/5 strength bilateral lower extremities - no appreciable hyperreflexia  Lab Results:  Basename 03/13/11 0515 03/12/11 0520 03/11/11 0415  NA 138 141 140  K 3.8 3.5 3.3*  CL 95* 96 97  CO2 34* 36* 35*  GLUCOSE 118*  115* 135*  BUN 40* 28* 19  CREATININE 1.90* 1.59* 1.34  CALCIUM 9.4 9.5 10.1  MG -- -- --  PHOS -- -- --    Basename 03/12/11 0520 03/11/11 0415  WBC 6.3 6.7  NEUTROABS -- --  HGB 12.1* 12.5*  HCT 37.4* 37.8*  MCV 88.6 87.1  PLT 180 173    Basename 03/10/11 2200 03/10/11 1520  CKTOTAL 141 118  CKMB 2.0 2.0  CKMBINDEX -- --  TROPONINI <0.30 <0.30    Micro Results: Recent Results (from the past 240 hour(s))  MRSA PCR SCREENING     Status: Abnormal   Collection Time   03/10/11  6:24 AM      Component Value Range Status Comment   MRSA by PCR POSITIVE (*) NEGATIVE  Final     Studies/Results: All recent x-ray/radiology reports have been reviewed in detail.   Medications: I have reviewed the patient's complete medication list.  Assessment/Plan:  Acute exacerbation of chronic diastolic CHF *Last echo in 2009 revealed EF 65% - followed by [facility]) - was well compensated at last visit (02/28/11)  *fluid balance is negative 8L since admit and BUN/creatinine rising therefore will stop Lasix but continue hydrochlorothiazide  OSA s/p trach w/ QHS vent  *PCCM aware and to see PRN for vent monitoring - have resumed home vent settings as clarified by RT  *discussed trach change with PCCM today 03/13/2011  hypokalemia *Due to diuresis - improving with replacement  DM2 *Reasonably well controlled at present  *Continue Lantus insulin as well as sliding scale insulin  Bilateral lower extremity  weakness *Etiology unclear and no definitive neurological deficits on clinical exam but clinical exam also limited by patient's obesity *Concerned over acute change in ability to ambulate therefore we'll proceed with MRI of the lumbar spine today  hx of PE *Compliance has been a complicating issue  *Pharmacy managing warfarin dosing  Obesity  HTN *Well controlled at present  *Continue hydrochlorothiazide, clonidine, Olmesartan and Coreg  Hx of cough on ACE Inhib *Is  tolerating an ARB w/o difficulty at present  Remote hx of EtOH and tobacco abuse *Denies current use of either   Disposition *Will remain in step down until medically stable for discharge due to requirement of nocturnal mechanical ventilation *Pending results of MRI may be a candidate for [facility] admission  Junious Silk ANP [facility] Office  [phone] Pager [phone]  On-Call/Text Page:      [email or web address]      password [email or web address]  Addendum: 3:15pm PVC bigeminy/nonsustained ventricular tachycardia RN called. Pt with 25 beat run VT-asleep and no symptoms, Will check magnesium and potassium levels. Discussed with Dr. Sharon Seller- consider ECHO since last done 2009 and EF at lower limits of normal- deferred to Cardiology, as he is already established w/ [facility]. Will ask them to see in consultation.  Junious Silk, ANP  I have personally examined this patient and reviewed the entire database. I have reviewed the above note, made any necessary editorial changes, and agree with its content.  Lonia Blood, MD [facility]

## 2011-03-13 NOTE — Progress Notes (Signed)
PHARMACY - ANTICOAGULATION CONSULT INITIAL NOTE  Pharmacy Consult for: Coumadin and Lovenox  Indication: H/o pulmonary embolism     Patient Data:   Allergies: No Known Allergies  Patient Measurements: Height: 6\' 2"  (188 cm) Weight: 363 lb 12.1 oz (165 kg) IBW/kg (Calculated) : 82.2    Vital Signs: Temp:  [98 F (36.7 C)-99.2 F (37.3 C)] 99.2 F (37.3 C) (02/25 0700) Pulse Rate:  [68-80] 79  (02/25 0813) Resp:  [11-24] 21  (02/25 0813) BP: (91-125)/(58-92) 124/92 mmHg (02/25 0810) SpO2:  [93 %-100 %] 97 % (02/25 0813) FiO2 (%):  [28 %] 28 % (02/25 0813) Weight:  [363 lb 12.1 oz (165 kg)] 363 lb 12.1 oz (165 kg) (02/25 0500)  Intake/Output from previous day:  Intake/Output Summary (Last 24 hours) at 03/13/11 0902 Last data filed at 03/13/11 0700  Gross per 24 hour  Intake    240 ml  Output   1300 ml  Net  -1060 ml    Labs:  Basename 03/13/11 0515 03/12/11 0520 03/11/11 0415 03/10/11 2200 03/10/11 1520  HGB -- 12.1* 12.5* -- --  HCT -- 37.4* 37.8* -- --  PLT -- 180 173 -- --  APTT -- -- -- -- --  LABPROT 28.3* 23.7* 19.7* -- --  INR 2.60* 2.07* 1.64* -- --  HEPARINUNFRC -- -- -- -- --  CREATININE 1.90* 1.59* 1.34 -- --  CKTOTAL -- -- -- 141 118  CKMB -- -- -- 2.0 2.0  TROPONINI -- -- -- <0.30 <0.30   Estimated Creatinine Clearance: 63.2 ml/min (by C-G formula based on Cr of 1.9).  Medical History: Past Medical History  Diagnosis Date  . ANXIETY 07/23/2006  . ASTHMA 07/23/2006  . DM 05/23/2007  . HYPERTENSION 07/23/2006  . OSTEOARTHRITIS 07/23/2006  . HYPERCHOLESTEROLEMIA 05/23/2007  . ANEMIA 08/31/2008  . Palpitations 02/10/2008  . ALCOHOL ABUSE, IN REMISSION, HX OF 08/26/2008  . TOBACCO ABUSE, HX OF 08/26/2008  . Pulmonary embolism 03/16/2010  . ED (erectile dysfunction)   . Morbid obesity   . DM retinopathy   . 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 DM neuropathy with neurologic complication   . CHF (congestive heart failure)    With a preserved EF  . 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 Sleep apnea   . Shortness of breath   . CARCINOMA, PROSTATE 09/29/2009    Scheduled medications:     . ALPRAZolam  0.5 mg Oral TID  . antiseptic oral rinse  15 mL Mouth Rinse QID  . carvedilol  3.125 mg Oral BID WC  . chlorhexidine  15 mL Mouth Rinse BID  . Chlorhexidine Gluconate Cloth  6 each Topical Q0600  . citalopram  20 mg Oral Daily  . cloNIDine  0.1 mg Oral Q8H  . enoxaparin (LOVENOX) injection  170 mg Subcutaneous Q12H  . furosemide  40 mg Oral BID  . hydrochlorothiazide  25 mg Oral Daily  . insulin aspart  0-9 Units Subcutaneous TID WC  . insulin glargine  50 Units Subcutaneous q morning - 10a  . metoCLOPramide  5 mg Oral TID WC  . mulitivitamin with minerals  1 tablet Oral Daily  . mupirocin ointment  1 application Nasal BID  . olmesartan  20 mg Oral Daily  . potassium chloride SA  40 mEq Oral BID  . simvastatin  20 mg Oral QPM  . sodium  chloride  3 mL Intravenous Q12H  . sodium chloride  3 mL Intravenous Q12H  . warfarin  10 mg Oral Custom  . warfarin  5 mg Oral Custom  . DISCONTD: ALPRAZolam  1 mg Oral TID  . DISCONTD: carvedilol  6.25 mg Oral BID WC  . DISCONTD: cloNIDine  0.2 mg Oral Q8H  . DISCONTD: furosemide  60 mg Intravenous Q8H  . DISCONTD: olmesartan  40 mg Oral Daily     Assessment:  66 y.o. male admitted on 03/09/2011, with h/o pulmonary embolism in February 2012. Pharmacy consulted to manage Coumadin. INR 2.6 today, received 10 mg 2/22 and 15 mg 2/23. PTA patient takes Coumadin 10mg  daily, except 5mg  on Monday and Thursdays. Will continue with home dose. Will dc lovenox today since INR is therapeutic x 2 days now.   Goal of Therapy:  1. INR 2-3  Plan:  1. Resume home dose Coumadin of 10 mg daily except 5 mg on Monday and Thursday 2. Daily PT / INR. 3. Dc lovenox

## 2011-03-13 NOTE — Evaluation (Signed)
Occupational Therapy Evaluation Patient Details Name: Travis Chapman MRN: [id number] DOB: [birth year] Today's Date: 03/13/2011  Problem List:  Patient Active Problem List  Diagnoses  . CARCINOMA, PROSTATE  . DM  . HYPERCHOLESTEROLEMIA  . ANEMIA  . ANXIETY  . HYPERTENSION  . CARDIOMYOPATHY  . ASTHMA  . OSTEOARTHRITIS  . SHOULDER PAIN, BILATERAL  . KNEE PAIN  . WEIGHT GAIN  . PALPITATIONS  . HYPOVENTILATION  . PROTEINURIA, MILD  . NONSPECIFIC ABNORMAL ELECTROCARDIOGRAM  . ALCOHOL ABUSE, IN REMISSION, HX OF  . TOBACCO ABUSE, HX OF  . Pulmonary embolism  . Diastolic HF (heart failure)  . CHF (congestive heart failure)  . Weakness    Past Medical History:  Past Medical History  Diagnosis Date  . ANXIETY 07/23/2006  . ASTHMA 07/23/2006  . DM 05/23/2007  . HYPERTENSION 07/23/2006  . OSTEOARTHRITIS 07/23/2006  . HYPERCHOLESTEROLEMIA 05/23/2007  . ANEMIA 08/31/2008  . Palpitations 02/10/2008  . ALCOHOL ABUSE, IN REMISSION, HX OF 08/26/2008  . TOBACCO ABUSE, HX OF 08/26/2008  . Pulmonary embolism 03/16/2010  . ED (erectile dysfunction)   . Morbid obesity   . DM retinopathy   . 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 DM neuropathy with neurologic complication   . CHF (congestive heart failure)     With a preserved EF  . 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 Sleep apnea   . Shortness of breath   . CARCINOMA, PROSTATE 09/29/2009   Past Surgical History:  Past Surgical History  Procedure Date  . Back surgery   . Tracheostomy     OT Assessment/Plan/Recommendation OT Assessment Clinical Impression Statement: Pt admitted for acute weakness in LEs starting last Thursday that made it difficult for pt to ambulate and complete adls.  Pt would benefit from cont OT to increase I in basic adls and achieve baseline level of independence. OT Recommendation/Assessment: Patient will need skilled OT in the acute care  venue OT Problem List: Decreased strength;Decreased activity tolerance;Impaired balance (sitting and/or standing);Decreased coordination;Decreased knowledge of use of DME or AE;Obesity Barriers to Discharge: Decreased caregiver support Barriers to Discharge Comments: Pt lives with 65 year old mother who is unable to physically help too much.   OT Therapy Diagnosis : Generalized weakness OT Plan OT Frequency: Min 2X/week OT Treatment/Interventions: Self-care/ADL training;Therapeutic exercise;DME and/or AE instruction;Therapeutic activities OT Recommendation Recommendations for Other Services: PT consult;Rehab consult Follow Up Recommendations: Inpatient Rehab;Skilled nursing facility;Other (comment);Supervision/Assistance - 24 hour (depending on MRI results.  ) Equipment Recommended: Defer to next venue Individuals Consulted Consulted and Agree with Results and Recommendations: Patient OT Goals Acute Rehab OT Goals OT Goal Formulation: With patient Time For Goal Achievement: 2 weeks ADL Goals Pt Will Perform Grooming: with min assist;Standing at sink ADL Goal: Grooming - Progress: Goal set today Pt Will Perform Lower Body Bathing: with mod assist;Sitting at sink;Sit to stand from chair ADL Goal: Lower Body Bathing - Progress: Goal set today Pt Will Perform Lower Body Dressing: with min assist;Sit to stand from chair;with adaptive equipment ADL Goal: Lower Body Dressing - Progress: Goal set today Pt Will Perform Tub/Shower Transfer: Tub transfer;with min assist;Stand pivot transfer;Transfer tub bench ADL Goal: Tub/Shower Transfer - Progress: Goal set today Additional ADL Goal #1: Pt will complete all aspects of toileting onto a bariatric 3:1 with S. ADL Goal: Additional Goal #1 - Progress: Goal set today  OT Evaluation Precautions/Restrictions  Precautions Precautions: Fall Required Braces or Orthoses: No Restrictions Weight Bearing Restrictions: No Prior Functioning Home  Living Lives With: Family;Other (Comment) (mother) Receives Help From: Family Type of Home: House Home Layout: One level Home Access: Stairs to enter Entrance Stairs-Rails: Right;Left;Can reach both Secretary/administrator of Steps: 2 Bathroom Shower/Tub: Tub/shower unit;Curtain Firefighter: Standard Home Adaptive Equipment: Straight cane;Walker - standard Additional Comments: states someone brought him a w/c but he is not sure he fits into it. Prior Function Level of Independence: Independent with basic ADLs;Independent with gait;Independent with transfers;Needs assistance with homemaking Homemaking Assistance Comments: Pt's 40 year old mother cooks and does all cleaning. Able to Take Stairs?: Yes Driving: Yes Vocation: On disability Leisure: Hobbies-no Comments: Pt drives and visits friends. ADL ADL Eating/Feeding: Performed;Set up Eating/Feeding Details (indicate cue type and reason): Pt had difficulty cutting food and opening packages. Where Assessed - Eating/Feeding: Chair Grooming: Performed;Set up Grooming Details (indicate cue type and reason): Pts body habitus makes it difficult for him to reach his tray and therefore needs some assist with set up. Where Assessed - Grooming: Sitting, chair Upper Body Bathing: Simulated;Minimal assistance Upper Body Bathing Details (indicate cue type and reason): min assist to reach all areas secondary to obesity. Where Assessed - Upper Body Bathing: Sitting, chair Lower Body Bathing: Simulated;Maximal assistance Lower Body Bathing Details (indicate cue type and reason): Pt requires mod assist to stand but once in standing cannot let go of walker to perform adls. Where Assessed - Lower Body Bathing: Sit to stand from chair Upper Body Dressing: Simulated;Minimal assistance Where Assessed - Upper Body Dressing: Sitting, chair Lower Body Dressing: Maximal assistance;Simulated Lower Body Dressing Details (indicate cue type and reason): max  assist reaching feet.  May need to introduce adaptive equipment.  Pt states he can reach feet if he sits on EOB.  Will attempt next visit. Where Assessed - Lower Body Dressing: Sit to stand from chair Toilet Transfer: Performed;Maximal assistance Toilet Transfer Method: Stand pivot Toilet Transfer Equipment: Bedside commode Toileting - Clothing Manipulation: Simulated;+1 Total assistance Toileting - Clothing Manipulation Details (indicate cue type and reason): Pt unable to let go of walker to manage clothes. Where Assessed - Toileting Clothing Manipulation: Sit to stand from 3-in-1 or toilet Toileting - Hygiene: Simulated;Moderate assistance Toileting - Hygiene Details (indicate cue type and reason): difficult to reach on BSC secondary to body habitus. Where Assessed - Toileting Hygiene: Sit on 3-in-1 or toilet Tub/Shower Transfer: Not assessed Tub/Shower Transfer Method: Not assessed Ambulation Related to ADLs: unable to ambulate with one person assist. ADL Comments: Pt has difficulty now with LE adls due to weakness in LEs and decreased mobility. Vision/Perception  Vision - History Baseline Vision: Other (comment) (Blind in R eye) Visual History: Other (comment) (Blind R eye.) Patient Visual Report: No change from baseline Vision - Assessment Eye Alignment: Within Functional Limits Vision Assessment: Vision not tested Cognition Cognition Arousal/Alertness: Awake/alert Overall Cognitive Status: Appears within functional limits for tasks assessed Cognition - Other Comments: Intact. Sensation/Coordination Sensation Light Touch: Appears Intact Coordination Gross Motor Movements are Fluid and Coordinated: Yes Fine Motor Movements are Fluid and Coordinated: No Extremity Assessment RUE Assessment RUE Assessment: Within Functional Limits LUE Assessment LUE Assessment: Within Functional Limits Mobility  Bed Mobility Bed Mobility: No Transfers Transfers: Yes Sit to Stand: 3: Mod  assist;With upper extremity assist;With armrests;From chair/3-in-1 Sit to Stand Details (indicate cue type and reason): Pt required mod assist to maintain standing and could only do so for appx 20 seconds before  needing to sit. Stand to Sit: 3: Mod assist;With upper extremity assist;With armrests;To chair/3-in-1;Other (comment) (cues to control descent.) Exercises   End of Session OT - End of Session Activity Tolerance: Patient limited by fatigue Patient left: in chair;with call bell in reach;with family/visitor present Nurse Communication: Mobility status for transfers General Behavior During Session: [facility] for tasks performed Cognition: [facility] for tasks performed   Hope Budds [phone] 03/13/2011, 9:33 AM

## 2011-03-13 NOTE — Consult Note (Signed)
CARDIOLOGY CONSULT NOTE  Patient ID: Travis Chapman MRN: [id number] DOB/AGE: May 08, 1945 66 y.o.  Admit date: 03/09/2011 Referring Physician: Dr Sharon Seller Primary Physician: Dr Everardo All Primary Cardiologist: Dr Antoine Poche Reason for Consultation: Nonsustained VT  HPI: This is a 66 year old chronically ill gentleman who we were asked to evaluate for nonsustained ventricular tachycardia.  The patient was hospitalized 03/10/2011 with progressive weakness and inability to stand. The patient has a tracheostomy and he uses a ventilator on a nightly basis. His baseline activity level is markedly decreased. He has previously been able to get up and move around in his house as long as he has something to lean on. He was noted to have a fairly long run of nonsustained ventricular tachycardia today of 24 beats. He was asymptomatic with this. We were asked to see him for further evaluation.  The patient has a history of diastolic congestive heart failure. His last echocardiogram done in 2009 demonstrated preserved left ventricular systolic function. The patient's comorbidities include type 2 diabetes with complications, hypertension, history of pulmonary embolism on long-term warfarin, and morbid obesity.  The patient is a limited historian because of his tracheostomy. He clearly denies chest pain or pressure. He feels his breathing is stable. Much of the history was taken from his family members. They reported that he had significant leg swelling but that this has improved since admission. He denies orthopnea, PND, lightheadedness, or syncope.  The patient was recently seen in the office by Dr. Antoine Poche and these notes were reviewed. He was felt to be stable from a cardiovascular perspective, with recommendations made for lifestyle modification, salt reduction, weight loss, and no further cardiac testing was recommended.  Past Medical History  Diagnosis Date  . ANXIETY 07/23/2006  . ASTHMA 07/23/2006  . DM 05/23/2007   . HYPERTENSION 07/23/2006  . OSTEOARTHRITIS 07/23/2006  . HYPERCHOLESTEROLEMIA 05/23/2007  . ANEMIA 08/31/2008  . Palpitations 02/10/2008  . ALCOHOL ABUSE, IN REMISSION, HX OF 08/26/2008  . TOBACCO ABUSE, HX OF 08/26/2008  . Pulmonary embolism 03/16/2010  . ED (erectile dysfunction)   . Morbid obesity   . DM retinopathy   . 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 DM neuropathy with neurologic complication   . CHF (congestive heart failure)     With a preserved EF  . 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 Sleep apnea   . Shortness of breath   . CARCINOMA, PROSTATE 09/29/2009     Past Surgical History  Procedure Date  . Back surgery   . Tracheostomy      Family History  Problem Relation Age of Onset  . Cancer Neg Hx     Social History: History   Social History  . Marital Status: Divorced    Spouse Name: N/A    Number of Children: N/A  . Years of Education: N/A   Occupational History  . Disabled    Social History Main Topics  . Smoking status: Former Smoker    Quit date: 01/16/2005  . Smokeless tobacco: Not on file  . Alcohol Use: No  . Drug Use: Not on file  . Sexually Active: Not on file   Other Topics Concern  . Not on file   Social History Narrative   SingleLives with his elderly mother     Prescriptions prior to admission  Medication Sig Dispense Refill  . aliskiren (TEKTURNA) 150 MG tablet  Take 75 mg by mouth daily.      Marland Kitchen ALPRAZolam (XANAX XR) 3 MG 24 hr tablet Take 3 mg by mouth at bedtime.      Marland Kitchen amLODipine (NORVASC) 10 MG tablet Take 10 mg by mouth daily.      . citalopram (CELEXA) 40 MG tablet Take 40 mg by mouth daily.      . cloNIDine (CATAPRES) 0.2 MG tablet Take 0.2 mg by mouth 3 (three) times daily.      . furosemide (LASIX) 40 MG tablet Take 40 mg by mouth 2 (two) times daily.      . hydrochlorothiazide (HYDRODIURIL) 25 MG tablet Take 25 mg by mouth daily.      .  insulin glargine (LANTUS) 100 UNIT/ML injection Inject 50 Units into the skin every morning.      . metoCLOPramide (REGLAN) 5 MG tablet Take 5 mg by mouth 3 (three) times daily.      . Multiple Vitamin (MULTIVITAMIN) tablet Take 1 tablet by mouth daily.        Marland Kitchen olmesartan (BENICAR) 40 MG tablet Take 40 mg by mouth daily.      Marland Kitchen oxyCODONE (OXYCONTIN) 15 MG TB12 Take 15 mg by mouth 4 (four) times daily as needed. For pain.      . potassium chloride SA (K-DUR,KLOR-CON) 20 MEQ tablet Take 40 mEq by mouth daily.      . simvastatin (ZOCOR) 20 MG tablet Take 20 mg by mouth every evening.      . warfarin (COUMADIN) 10 MG tablet Take 5-10 mg by mouth daily. Take 1 tablet (10 MG) every day except Mondays and Thursdays. On Mondays and Thursdays take 0.5 tablets (5 MG)        ROS: General: no fevers/chills/night sweats. Positive for generalized weakness Eyes: no blurry vision, diplopia, or amaurosis. Positive for blindness in the left eye ENT: no sore throat or hearing loss Resp: Positive for cough, negative for wheezing or hemoptysis CV: See history of present illness GI: no abdominal pain, nausea, vomiting, diarrhea, or constipation GU: no dysuria, frequency, or hematuria Skin: no rash Neuro: no headache, numbness, tingling, or weakness of extremities Musculoskeletal: Positive for chronic bilateral knee pain Heme: no bleeding, DVT, or easy bruising Endo: no polydipsia or polyuria   Physical Exam: Blood pressure 104/56, pulse 81, temperature 98.4 F (36.9 C), temperature source Oral, resp. rate 23, height 6\' 2"  (1.88 m), weight 165 kg (363 lb 12.1 oz), SpO2 97.00%.  Pt is alert and oriented, chronically ill-appearing, morbidly obese male in no distress. HEENT: normal Neck: JVP - unable to visualize. Carotid upstrokes normal without bruits. No thyromegaly. Lungs: equal expansion, coarse bilateral upper respiratory sounds with good air movement CV: Apex is discrete and nondisplaced, RRR without  murmur or gallop. Heart sounds are distant Abd: soft, NT, +BS, no bruit, obese Back: no CVA tenderness Ext: Trace pretibial edema        Femoral pulses 2+=         DP/PT pulses intact and = Skin: warm and dry without rash Neuro: CNII-XII intact             Strength intact = bilaterally  Labs:   Lab Results  Component Value Date   WBC 6.3 03/12/2011   HGB 12.1* 03/12/2011   HCT 37.4* 03/12/2011   MCV 88.6 03/12/2011   PLT 180 03/12/2011    Lab 03/13/11 0515 03/10/11 0500  NA 138 --  K 3.8 --  CL 95* --  CO2 34* --  BUN 40* --  CREATININE 1.90* --  CALCIUM 9.4 --  PROT -- 8.3  BILITOT -- 0.3  ALKPHOS -- 85  ALT -- 9  AST -- 17  GLUCOSE 118* --   Lab Results  Component Value Date   CKTOTAL 141 03/10/2011   CKMB 2.0 03/10/2011   TROPONINI <0.30 03/10/2011    Lab Results  Component Value Date   CHOL 143 11/15/2010   CHOL 100 09/29/2009   CHOL 97 08/26/2008   Lab Results  Component Value Date   HDL 43.10 11/15/2010   HDL 34.00* 09/29/2009   HDL 42.40 08/26/2008   Lab Results  Component Value Date   LDLCALC 84 11/15/2010   LDLCALC 50 09/29/2009   LDLCALC 46 08/26/2008   Lab Results  Component Value Date   TRIG 82.0 11/15/2010   TRIG 81.0 09/29/2009   TRIG 41.0 08/26/2008   Lab Results  Component Value Date   CHOLHDL 3 11/15/2010   CHOLHDL 3 09/29/2009   CHOLHDL 2 08/26/2008   Lab Results  Component Value Date   LDLDIRECT 87.2 11/15/2010      Radiology: No results found.  EKG: 03/10/2011: Normal sinus rhythm with frequent PVCs. Poor R wave progression across the precordium cannot rule out anterior infarct age undetermined. Left axis deviation.  Telemetry: The baseline rhythm is normal sinus. There are frequent PVCs. There is a 24 beat run of wide-complex tachycardia with an axis change suggestive of nonsustained ventricular tachycardia.  ASSESSMENT AND PLAN:  1. Nonsustained ventricular tachycardia 2. Acute on chronic diastolic heart failure 3. 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 Acute on chronic renal insufficiency 5. Type 2 diabetes with complications  The patient had an asymptomatic episode of nonsustained ventricular tachycardia. This occurred on a background of chronic medical illness with multiple comorbidities. Recommend a 2-D echocardiogram to reassess left ventricular systolic function and to rule out new cardiomyopathy. Also would repeat an EKG to rule out medication induced prolonged QT.  Electrolytes should be repleted so the magnesium is maintained at greater than 2.0 and potassium is maintained greater than 4.0. Would also recommend decreasing clonidine to allow more beta blocker. Will reduce his clonidine dose to 0.1 mg twice daily and will increase his carvedilol dose to 6.25 mg twice daily. Also hold the patient's hydrochlorothiazide in order to avoid hypokalemia and worsening of his acute on chronic renal insufficiency. Will defer further management to the internal medicine team. We will followup tomorrow after the patient's echocardiogram is completed. Thank you for the opportunity to see this nice gentleman in consultation. Please feel free to call at any time with questions.   Tonny Bollman 03/13/2011, 5:26 PM

## 2011-03-13 NOTE — Progress Notes (Signed)
Pt has 25 beats run of V-tach. Pt is asymptomatic and VS remain stable (see flow sheet). PA notified. New order received. Will continue to monitor pt.

## 2011-03-13 NOTE — Clinical Documentation Improvement (Signed)
RESPIRATORY FAILURE DOCUMENTATION CLARIFICATION QUERY   THIS DOCUMENT IS NOT A PERMANENT PART OF THE MEDICAL RECORD  TO RESPOND TO THE THIS QUERY, FOLLOW THE INSTRUCTIONS BELOW:  1. If needed, update documentation for the patient's encounter via the notes activity.  2. Access this query again and click edit on the In [employer].  3. After updating, or not, click F2 to complete all highlighted (required) fields concerning your review. Select "additional documentation in the medical record" OR "no additional documentation provided".  4. Click Sign note button.  5. The deficiency will fall out of your In Basket *Please let us know if you are not able to complete this workflow by phone or e-mail (listed below).  Please update your documentation within the medical record to reflect your response to this query.                                                                                     03/13/11  Dear Dr. [facility],  In a better effort to capture your patient's severity of illness, reflect appropriate length of stay and utilization of resources, a review of the patient medical record has revealed the following indicators.    Based on your clinical judgment, please clarify and document in a progress note and/or discharge summary the clinical condition associated with the following supporting information:  In responding to this query please exercise your independent judgment.  The fact that a query is asked, does not imply that any particular answer is desired or expected.  Possible Clinical Conditions  _______Chronic Respiratory Failure _______Obesity Hyperventilation Syndrome _______Other Condition _______Cannot Clinically Determine    Supporting Information:  Risk Factors: Hx OSA s/p Trach (on ventilator at night) Diastolic Heart Failure Morbid Obesity w/ BMI=48   Treatment: Trach collar @ 50% during the day and Ventilator at HS Continuous  monitoring                   You may use possible, probable, or suspect with inpatient documentation. possible, probable, suspected diagnoses MUST be documented at the time of discharge   Reviewed: Query answered by Dr Murrell Converse    Thank You,    Rossie Muskrat RN, BSN Clinical Documentation Specialist Pager:  [phone] [email or web address]  Health Information Management [place]

## 2011-03-14 DIAGNOSIS — I369 Nonrheumatic tricuspid valve disorder, unspecified: Secondary | ICD-10-CM

## 2011-03-14 DIAGNOSIS — I509 Heart failure, unspecified: Secondary | ICD-10-CM

## 2011-03-14 LAB — COMPREHENSIVE METABOLIC PANEL WITH GFR
ALT: 15 U/L (ref 0–53)
AST: 16 U/L (ref 0–37)
Albumin: 3.2 g/dL — ABNORMAL LOW (ref 3.5–5.2)
Alkaline Phosphatase: 68 U/L (ref 39–117)
BUN: 40 mg/dL — ABNORMAL HIGH (ref 6–23)
CO2: 35 meq/L — ABNORMAL HIGH (ref 19–32)
Calcium: 9.6 mg/dL (ref 8.4–10.5)
Chloride: 95 meq/L — ABNORMAL LOW (ref 96–112)
Creatinine, Ser: 1.55 mg/dL — ABNORMAL HIGH (ref 0.50–1.35)
GFR calc Af Amer: 53 mL/min — ABNORMAL LOW
GFR calc non Af Amer: 45 mL/min — ABNORMAL LOW
Glucose, Bld: 136 mg/dL — ABNORMAL HIGH (ref 70–99)
Potassium: 3.6 meq/L (ref 3.5–5.1)
Sodium: 138 meq/L (ref 135–145)
Total Bilirubin: 0.3 mg/dL (ref 0.3–1.2)
Total Protein: 7.2 g/dL (ref 6.0–8.3)

## 2011-03-14 LAB — GLUCOSE, CAPILLARY
Glucose-Capillary: 124 mg/dL — ABNORMAL HIGH (ref 70–99)
Glucose-Capillary: 198 mg/dL — ABNORMAL HIGH (ref 70–99)

## 2011-03-14 LAB — PROTIME-INR
INR: 2.84 — ABNORMAL HIGH (ref 0.00–1.49)
Prothrombin Time: 30.3 seconds — ABNORMAL HIGH (ref 11.6–15.2)

## 2011-03-14 LAB — MAGNESIUM: Magnesium: 2.5 mg/dL (ref 1.5–2.5)

## 2011-03-14 MED ORDER — WARFARIN SODIUM 10 MG PO TABS
10.0000 mg | ORAL_TABLET | ORAL | Status: DC
  Administered 2011-03-15: 10 mg via ORAL
  Filled 2011-03-14 (×3): qty 1

## 2011-03-14 MED ORDER — INSULIN ASPART 100 UNIT/ML ~~LOC~~ SOLN
0.0000 [IU] | Freq: Every day | SUBCUTANEOUS | Status: DC
  Administered 2011-03-14: 2 [IU] via SUBCUTANEOUS

## 2011-03-14 MED ORDER — INSULIN ASPART 100 UNIT/ML ~~LOC~~ SOLN
0.0000 [IU] | Freq: Three times a day (TID) | SUBCUTANEOUS | Status: DC
  Administered 2011-03-14: 2 [IU] via SUBCUTANEOUS
  Administered 2011-03-15: 3 [IU] via SUBCUTANEOUS
  Administered 2011-03-16 – 2011-03-17 (×2): 2 [IU] via SUBCUTANEOUS

## 2011-03-14 MED ORDER — WARFARIN SODIUM 5 MG PO TABS
5.0000 mg | ORAL_TABLET | Freq: Once | ORAL | Status: AC
  Administered 2011-03-14: 5 mg via ORAL
  Filled 2011-03-14: qty 1

## 2011-03-14 MED ORDER — POTASSIUM CHLORIDE CRYS ER 20 MEQ PO TBCR
20.0000 meq | EXTENDED_RELEASE_TABLET | Freq: Once | ORAL | Status: AC
  Administered 2011-03-14: 20 meq via ORAL

## 2011-03-14 NOTE — Progress Notes (Signed)
SUBJECTIVE:   Breathing better than previous.  No chest pain.   PHYSICAL EXAM Filed Vitals:   03/14/11 1145 03/14/11 1226 03/14/11 1255 03/14/11 1624  BP: 129/115  93/60   Pulse: 49 81 74 63  Temp:      TempSrc:      Resp: 28 23 25 23   Height:      Weight:      SpO2: 97% 98% 94% 96%   General:  No distress Lungs:  Decreased BS, no crackles Heart:  RRR Abdomen:  Positive bowel sounds, no rebound no guarding Extremities:  Mild edema  LABS: Lab Results  Component Value Date   CKTOTAL 141 03/10/2011   CKMB 2.0 03/10/2011   TROPONINI <0.30 03/10/2011   Results for orders placed during the hospital encounter of 03/09/11 (from the past 24 hour(s))  MAGNESIUM     Status: Normal   Collection Time   03/13/11  5:13 PM      Component Value Range   Magnesium 2.3  1.5 - 2.5 (mg/dL)  BASIC METABOLIC PANEL     Status: Abnormal   Collection Time   03/13/11  5:13 PM      Component Value Range   Sodium 136  135 - 145 (mEq/L)   Potassium 3.8  3.5 - 5.1 (mEq/L)   Chloride 95 (*) 96 - 112 (mEq/L)   CO2 34 (*) 19 - 32 (mEq/L)   Glucose, Bld 177 (*) 70 - 99 (mg/dL)   BUN 43 (*) 6 - 23 (mg/dL)   Creatinine, Ser 1.61 (*) 0.50 - 1.35 (mg/dL)   Calcium 9.4  8.4 - 09.6 (mg/dL)   GFR calc non Af Amer 39 (*) >90 (mL/min)   GFR calc Af Amer 45 (*) >90 (mL/min)  GLUCOSE, CAPILLARY     Status: Abnormal   Collection Time   03/13/11  5:48 PM      Component Value Range   Glucose-Capillary 161 (*) 70 - 99 (mg/dL)   Comment 1 Notify RN    GLUCOSE, CAPILLARY     Status: Abnormal   Collection Time   03/13/11  9:10 PM      Component Value Range   Glucose-Capillary 286 (*) 70 - 99 (mg/dL)   Comment 1 Documented in Chart     Comment 2 Notify RN    PROTIME-INR     Status: Abnormal   Collection Time   03/14/11  4:18 AM      Component Value Range   Prothrombin Time 30.3 (*) 11.6 - 15.2 (seconds)   INR 2.84 (*) 0.00 - 1.49   MAGNESIUM     Status: Normal   Collection Time   03/14/11  4:18 AM   Component Value Range   Magnesium 2.5  1.5 - 2.5 (mg/dL)  COMPREHENSIVE METABOLIC PANEL     Status: Abnormal   Collection Time   03/14/11  4:18 AM      Component Value Range   Sodium 138  135 - 145 (mEq/L)   Potassium 3.6  3.5 - 5.1 (mEq/L)   Chloride 95 (*) 96 - 112 (mEq/L)   CO2 35 (*) 19 - 32 (mEq/L)   Glucose, Bld 136 (*) 70 - 99 (mg/dL)   BUN 40 (*) 6 - 23 (mg/dL)   Creatinine, Ser 0.45 (*) 0.50 - 1.35 (mg/dL)   Calcium 9.6  8.4 - 40.9 (mg/dL)   Total Protein 7.2  6.0 - 8.3 (g/dL)   Albumin 3.2 (*) 3.5 - 5.2 (g/dL)   AST  16  0 - 37 (U/L)   ALT 15  0 - 53 (U/L)   Alkaline Phosphatase 68  39 - 117 (U/L)   Total Bilirubin 0.3  0.3 - 1.2 (mg/dL)   GFR calc non Af Amer 45 (*) >90 (mL/min)   GFR calc Af Amer 53 (*) >90 (mL/min)  GLUCOSE, CAPILLARY     Status: Abnormal   Collection Time   03/14/11  8:19 AM      Component Value Range   Glucose-Capillary 124 (*) 70 - 99 (mg/dL)   Comment 1 Notify RN    GLUCOSE, CAPILLARY     Status: Abnormal   Collection Time   03/14/11 11:49 AM      Component Value Range   Glucose-Capillary 198 (*) 70 - 99 (mg/dL)   Comment 1 Notify RN      Intake/Output Summary (Last 24 hours) at 03/14/11 1639 Last data filed at 03/14/11 1500  Gross per 24 hour  Intake   1133 ml  Output   1277 ml  Net   -144 ml    ECHO:   Normal EF.  No evidence of valvular abnormalities.  ASSESSMENT AND PLAN:   1)  CHF (congestive heart failure):  Diastolic dysfunction.  Volume difficult to assess but pro BNP is markedly elevated.  Total I/O negative 8 liters.  Diuretics have been held with increased BUN/Creat.   2)   NSVT (nonsustained ventricular tachycardia):  Telemetry without sustained tachyarrhythmia.  No change in therapy suggested.  We will follow as needed.   Rollene Rotunda 03/14/2011 4:39 PM

## 2011-03-14 NOTE — Progress Notes (Signed)
PHARMACY - ANTICOAGULATION CONSULT INITIAL NOTE  Pharmacy Consult for: Coumadin and Lovenox  Indication: H/o pulmonary embolism     Patient Data:   Allergies: No Known Allergies  Patient Measurements: Height: 6\' 2"  (188 cm) Weight: 360 lb 0.2 oz (163.3 kg) IBW/kg (Calculated) : 82.2    Vital Signs: Temp:  [97.8 F (36.6 C)-98.6 F (37 C)] 98.1 F (36.7 C) (02/26 0754) Pulse Rate:  [77-92] 83  (02/26 0754) Resp:  [16-26] 21  (02/26 0754) BP: (100-137)/(39-92) 137/76 mmHg (02/26 0754) SpO2:  [94 %-99 %] 96 % (02/26 0754) FiO2 (%):  [27.8 %-28.2 %] 28 % (02/26 0754) Weight:  [360 lb 0.2 oz (163.3 kg)] 360 lb 0.2 oz (163.3 kg) (02/26 0100)  Intake/Output from previous day:  Intake/Output Summary (Last 24 hours) at 03/14/11 1022 Last data filed at 03/14/11 0700  Gross per 24 hour  Intake    893 ml  Output   1325 ml  Net   -432 ml    Labs:  Basename 03/14/11 0418 03/13/11 1713 03/13/11 0515 03/12/11 0520  HGB -- -- -- 12.1*  HCT -- -- -- 37.4*  PLT -- -- -- 180  APTT -- -- -- --  LABPROT 30.3* -- 28.3* 23.7*  INR 2.84* -- 2.60* 2.07*  HEPARINUNFRC -- -- -- --  CREATININE 1.55* 1.76* 1.90* --  CKTOTAL -- -- -- --  CKMB -- -- -- --  TROPONINI -- -- -- --   Estimated Creatinine Clearance: 77 ml/min (by C-G formula based on Cr of 1.55).  Medical History: Past Medical History  Diagnosis Date  . ANXIETY 07/23/2006  . ASTHMA 07/23/2006  . DM 05/23/2007  . HYPERTENSION 07/23/2006  . OSTEOARTHRITIS 07/23/2006  . HYPERCHOLESTEROLEMIA 05/23/2007  . ANEMIA 08/31/2008  . Palpitations 02/10/2008  . ALCOHOL ABUSE, IN REMISSION, HX OF 08/26/2008  . TOBACCO ABUSE, HX OF 08/26/2008  . Pulmonary embolism 03/16/2010  . ED (erectile dysfunction)   . Morbid obesity   . DM retinopathy   . 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 DM neuropathy with neurologic complication   . CHF (congestive heart failure)     With a preserved EF  . 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 Sleep apnea   . Shortness of breath   . CARCINOMA, PROSTATE 09/29/2009    Scheduled medications:     . ALPRAZolam  0.5 mg Oral TID  . antiseptic oral rinse  15 mL Mouth Rinse QID  . carvedilol  6.25 mg Oral BID WC  . chlorhexidine  15 mL Mouth Rinse BID  . Chlorhexidine Gluconate Cloth  6 each Topical Q0600  . citalopram  20 mg Oral Daily  . cloNIDine  0.1 mg Oral BID  . insulin aspart  0-9 Units Subcutaneous TID WC  . insulin glargine  50 Units Subcutaneous q morning - 10a  . metoCLOPramide  5 mg Oral TID WC  . mulitivitamin with minerals  1 tablet Oral Daily  . mupirocin ointment  1 application Nasal BID  . olmesartan  20 mg Oral Daily  . potassium chloride  20 mEq Oral Once  . potassium chloride SA  40 mEq Oral BID  . simvastatin  20 mg Oral QPM  . sodium chloride  3 mL Intravenous Q12H  . warfarin  10 mg Oral Custom  . warfarin  5 mg Oral Custom  . DISCONTD: carvedilol  3.125 mg Oral BID WC  . DISCONTD: cloNIDine  0.1 mg Oral Q8H  . DISCONTD: hydrochlorothiazide  25 mg Oral Daily     Assessment:  66 y.o. male admitted on 03/09/2011, with h/o pulmonary embolism in February 2012. Pharmacy consulted to manage Coumadin. INR 2.6 today, received 10 mg 2/22 and 15 mg 2/23. PTA patient takes Coumadin 10mg  daily, except 5mg  on Monday and Thursdays. Will continue with home dose.   Goal of Therapy:  1. INR 2-3  Plan:  1. Coumadin 5mg  x 1 today then resume home dose

## 2011-03-14 NOTE — Progress Notes (Signed)
Placed pt on vent for the night. No complications noted.

## 2011-03-14 NOTE — Progress Notes (Signed)
[facility] [place] TEAM 8  Subjective: 66 year-old male with history of diastolic CHF, OSA status post tracheostomy and uses ventilator at bedtime, history of PE on Coumadin, history of hypertension, and diabetes mellitus 2 presents to the ER because of increasing weakness for the last couple of days and getting short of breath with minimal exertion.  Continues to endorse mild lumbar pain and new bilateral extremity profound weakness without numbness, tingling, or neuropathic/shooting type pains.  Objective: Weight change: -1.7 kg (-3 lb 12 oz)  Intake/Output Summary (Last 24 hours) at 03/14/11 1422 Last data filed at 03/14/11 1300  Gross per 24 hour  Intake   1133 ml  Output   1526 ml  Net   -393 ml   Blood pressure 93/60, pulse 74, temperature 98.4 F (36.9 C), temperature source Oral, resp. rate 25, height 6\' 2"  (1.88 m), weight 163.3 kg (360 lb 0.2 oz), SpO2 94.00%.  Physical Exam: General: No acute respiratory distress-resting comfortably. Able to vocalize by placing finger over opening of tracheostomy tube Lungs: Clear to auscultation bilaterally without wheezes or crackles-distant BS throughout, midline cuffed trach patent- trach collar in place (has a Shiley 7.0 cuffed XLT Proximal trach tube) Cardiovascular: Regular rate and rhythm without murmur gallop or rub - heart sounds are quite distant Abdomen: Nontender, nondistended, soft, bowel sounds positive, no rebound, no ascites, no appreciable mass - morbidly obese Extremities: 1+ B LE edema Neuro:  4+/5 strength bilateral lower extremities - no appreciable hyperreflexia  Lab Results:  Basename 03/14/11 0418 03/13/11 1713 03/13/11 0515  NA 138 136 138  K 3.6 3.8 3.8  CL 95* 95* 95*  CO2 35* 34* 34*  GLUCOSE 136* 177* 118*  BUN 40* 43* 40*  CREATININE 1.55* 1.76* 1.90*  CALCIUM 9.6 9.4 9.4  MG 2.5 2.3 --  PHOS -- -- --    Basename 03/12/11 0520  WBC 6.3  NEUTROABS --  HGB 12.1*  HCT 37.4*  MCV 88.6    PLT 180   No results found for this basename: CKTOTAL:3,CKMB:3,CKMBINDEX:3,TROPONINI:3 in the last 72 hours  Micro Results: Recent Results (from the past 240 hour(s))  MRSA PCR SCREENING     Status: Abnormal   Collection Time   03/10/11  6:24 AM      Component Value Range Status Comment   MRSA by PCR POSITIVE (*) NEGATIVE  Final     Studies/Results: All recent x-ray/radiology reports have been reviewed in detail.   Medications: I have reviewed the patient's complete medication list.  Assessment/Plan:  Acute exacerbation of chronic diastolic CHF *Last echo in 2009 revealed EF 65% - followed by [facility]) - was well compensated at last visit (02/28/11)  *fluid balance is negative 8L since admit and BUN/creatinine rising therefore will stop Lasix but continue hydrochlorothiazide  PVC bigeminy/nonsustained ventricular tachycardia *Had 24 beat run VT on 03/13/2011 while asleep and no symptoms  *Magnesium level was 2.3 and potassium level is less than 4.0.  *Cardiology was consulted.  *2-D echocardiogram has been ordered to evaluate for possible cardiomyopathy.  *Hydrochlorothiazide was discontinued. Clonidine dosage was decreased in favor of increasing beta blocker dosage.  OSA s/p trach w/ QHS vent  *PCCM aware and to see PRN for vent monitoring - have resumed home vent settings as clarified by RT  *discussed trach change with PCCM today 03/13/2011  hypokalemia *Cardiology recommends keeping potassium greater than or equal to 4.0 in setting of recent nonsustained ventricular tachycardia *Already on twice a day potassium will also  give 20 mEq extra dose today *Follow electrolyte  DM2 *Reasonably well controlled at present  *Continue Lantus insulin as well as sliding scale insulin  Bilateral lower extremity weakness *Etiology unclear and no definitive neurological deficits on clinical exam but clinical exam also limited by patient's obesity *Concerned over acute  change in ability to ambulate - MRI of the lumbar spine is pending  hx of PE *Compliance has been a complicating issue  *Pharmacy managing warfarin dosing  Obesity  HTN *Well controlled at present  *Continue clonidine, Olmesartan and Coreg  Hx of cough on ACE Inhib *Is tolerating an ARB w/o difficulty at present  Remote hx of EtOH and tobacco abuse *Denies current use of either   Disposition *Will remain in step down until medically stable for discharge due to requirement of nocturnal mechanical ventilation *Pending results of MRI may be a candidate for LTAC since patient would like to return to home environment  Junious Silk ANP [facility] Office  [phone] Pager [phone]  On-Call/Text Page:      [email or web address]      password [email or web address]    I have examined the patient, reviewed the chart and discussed the plan with Junious Silk, NP. I agree with the above note.   Calvert Cantor, MD [phone]

## 2011-03-14 NOTE — Progress Notes (Signed)
*  PRELIMINARY RESULTS* Echocardiogram 2D Echocardiogram has been performed.  Travis Chapman 03/14/2011, 10:19 AM

## 2011-03-14 NOTE — Progress Notes (Signed)
Inpatient Diabetes Program Recommendations  AACE/ADA: New Consensus Statement on Inpatient Glycemic Control (2009)  Target Ranges:  Prepandial:   less than 140 mg/dL      Peak postprandial:   less than 180 mg/dL (1-2 hours)      Critically ill patients:  140 - 180 mg/dL   Reason for Visit: Hyperglycemia following meals    Inpatient Diabetes Program Recommendations Correction (SSI): Please increase to moderate correciton tidwc from sensitive. Diet: Please change to carbohydrate modified (which includes heart healthy) rather than Heart diet whicih does not include carb modififications)  Note: Thank you, Lenor Coffin, RN, CNS, Diabetes Coordinator [phone])

## 2011-03-14 NOTE — Progress Notes (Signed)
Pt requested to go back on trach collar. No complications noted.

## 2011-03-14 NOTE — Progress Notes (Signed)
Physical Therapy Treatment Patient Details Name: Volney Reierson MRN: [id number] DOB: August 16, 1945 Today's Date: 03/14/2011  PT Assessment/Plan  PT - Assessment/Plan Comments on Treatment Session: Pt with CHF improving with mobility from evaluation. Pt able to ambulate today on RA with sats 89-94 throughout. Pt with increased ability with transfers and mobility today and encouraged to continue mobilizing with nursing assist.  PT Plan: Discharge plan remains appropriate Follow Up Recommendations: Inpatient Rehab PT Goals  Acute Rehab PT Goals PT Goal: Supine/Side to Sit - Progress: Progressing toward goal PT Goal: Sit to Supine/Side - Progress: Progressing toward goal PT Goal: Sit to Stand - Progress: Progressing toward goal Pt will go Stand to Sit: with supervision PT Goal: Stand to Sit - Progress: Updated due to goals met PT Goal: Ambulate - Progress: Progressing toward goal PT Goal: Perform Home Exercise Program - Progress: Progressing toward goal  PT Treatment Precautions/Restrictions  Precautions Precautions: Fall Required Braces or Orthoses: No Restrictions Weight Bearing Restrictions: No Mobility (including Balance) Bed Mobility Bed Mobility: No Transfers Sit to Stand: 3: Mod assist;4: Min assist;From chair/3-in-1;With armrests Sit to Stand Details (indicate cue type and reason): Pt mod assist to stand x 3 from chair, min assist x 1 and supervision x 1 with cueing each time to scoot to edge of surface, perform anterior weight shift and push with armrests Stand to Sit: 4: Min assist;To chair/3-in-1;With armrests Stand to Sit Details: cueing for hand placement and control of descent Ambulation/Gait Ambulation/Gait: Yes Ambulation/Gait Assistance: 4: Min assist Ambulation/Gait Assistance Details (indicate cue type and reason): minguard with assist x 1 to turn RW Ambulation Distance (Feet): 22 Feet (16' then 22' second trial) Assistive device: Rolling walker Gait Pattern: Trunk  flexed;Decreased stride length (increased BOS) Stairs: No  Posture/Postural Control Posture/Postural Control: No significant limitations Exercise  General Exercises - Lower Extremity Long Arc Quad: AROM;Both;10 reps;Seated Hip Flexion/Marching: AROM;Both;Standing;Other reps (comment) Awilda Bill) End of Session PT - End of Session Activity Tolerance: Patient tolerated treatment well Patient left: in chair;with call bell in reach;with family/visitor present Nurse Communication: Mobility status for transfers;Mobility status for ambulation General Behavior During Session: [facility] for tasks performed Cognition: [facility] for tasks performed  Delorse Lek 03/14/2011, 3:12 PM Toney Sang, PT [phone]

## 2011-03-15 LAB — BASIC METABOLIC PANEL
BUN: 37 mg/dL — ABNORMAL HIGH (ref 6–23)
CO2: 36 mEq/L — ABNORMAL HIGH (ref 19–32)
Calcium: 10 mg/dL (ref 8.4–10.5)
Glucose, Bld: 133 mg/dL — ABNORMAL HIGH (ref 70–99)
Sodium: 139 mEq/L (ref 135–145)

## 2011-03-15 LAB — GLUCOSE, CAPILLARY
Glucose-Capillary: 120 mg/dL — ABNORMAL HIGH (ref 70–99)
Glucose-Capillary: 108 mg/dL — ABNORMAL HIGH (ref 70–99)
Glucose-Capillary: 141 mg/dL — ABNORMAL HIGH (ref 70–99)

## 2011-03-15 MED ORDER — OXYCODONE HCL 5 MG PO TABS: 15.0000 mg | ORAL_TABLET | Freq: Four times a day (QID) | ORAL | Status: DC | PRN

## 2011-03-15 MED ORDER — POTASSIUM CHLORIDE CRYS ER 20 MEQ PO TBCR
40.0000 meq | EXTENDED_RELEASE_TABLET | Freq: Every day | ORAL | Status: DC
  Administered 2011-03-16 – 2011-03-17 (×2): 40 meq via ORAL
  Filled 2011-03-15 (×3): qty 2

## 2011-03-15 NOTE — Progress Notes (Signed)
SUBJECTIVE:   Breathing better than previous.  No chest pain.  Wants to have his trach changed while he is here.   PHYSICAL EXAM Filed Vitals:   03/14/11 2316 03/15/11 0039 03/15/11 0403 03/15/11 0632  BP:  136/92 118/83 116/69  Pulse: 76 80 59 85  Temp:  98.3 F (36.8 C) 98.3 F (36.8 C)   TempSrc:  Oral Oral   Resp: 23 15 12 20   Height:      Weight:  163.5 kg (360 lb 7.2 oz)    SpO2: 99% 99% 99% 98%   General:  No distress Lungs:  Decreased BS, no crackles Heart:  RRR Abdomen:  Positive bowel sounds, no rebound no guarding Extremities:  Mild edema  LABS: Lab Results  Component Value Date   CKTOTAL 141 03/10/2011   CKMB 2.0 03/10/2011   TROPONINI <0.30 03/10/2011   Results for orders placed during the hospital encounter of 03/09/11 (from the past 24 hour(s))  GLUCOSE, CAPILLARY     Status: Abnormal   Collection Time   03/14/11  8:19 AM      Component Value Range   Glucose-Capillary 124 (*) 70 - 99 (mg/dL)   Comment 1 Notify RN    GLUCOSE, CAPILLARY     Status: Abnormal   Collection Time   03/14/11 11:49 AM      Component Value Range   Glucose-Capillary 198 (*) 70 - 99 (mg/dL)   Comment 1 Notify RN    GLUCOSE, CAPILLARY     Status: Abnormal   Collection Time   03/14/11  5:58 PM      Component Value Range   Glucose-Capillary 142 (*) 70 - 99 (mg/dL)   Comment 1 Notify RN    GLUCOSE, CAPILLARY     Status: Abnormal   Collection Time   03/14/11 10:20 PM      Component Value Range   Glucose-Capillary 202 (*) 70 - 99 (mg/dL)   Comment 1 Documented in Chart     Comment 2 Notify RN    PROTIME-INR     Status: Abnormal   Collection Time   03/15/11  4:20 AM      Component Value Range   Prothrombin Time 25.7 (*) 11.6 - 15.2 (seconds)   INR 2.30 (*) 0.00 - 1.49   BASIC METABOLIC PANEL     Status: Abnormal   Collection Time   03/15/11  4:20 AM      Component Value Range   Sodium 139  135 - 145 (mEq/L)   Potassium 4.5  3.5 - 5.1 (mEq/L)   Chloride 96  96 - 112 (mEq/L)     CO2 36 (*) 19 - 32 (mEq/L)   Glucose, Bld 133 (*) 70 - 99 (mg/dL)   BUN 37 (*) 6 - 23 (mg/dL)   Creatinine, Ser 4.09 (*) 0.50 - 1.35 (mg/dL)   Calcium 81.1  8.4 - 10.5 (mg/dL)   GFR calc non Af Amer 44 (*) >90 (mL/min)   GFR calc Af Amer 51 (*) >90 (mL/min)    Intake/Output Summary (Last 24 hours) at 03/15/11 9147 Last data filed at 03/15/11 0500  Gross per 24 hour  Intake    833 ml  Output   1203 ml  Net   -370 ml    ASSESSMENT AND PLAN:   1)  CHF (congestive heart failure):  Diastolic dysfunction.  Volume difficult to assess but pro BNP is markedly elevated.  Total I/O negative 8.795  liters.  Diuretics have been held  with increased BUN/Creat.  He will need to be restarted on Lasix at discharge. (I would suggest back to 40 bid.)  He eats a lot of salted snacks at home and he has no scale.  I discussed this with the patient and his family.   2)   NSVT (nonsustained ventricular tachycardia):  Telemetry without sustained tachyarrhythmia.  No change in therapy suggested.  We will follow as needed.   Fayrene Fearing [facility] 03/15/2011 6:43 AM

## 2011-03-15 NOTE — Progress Notes (Signed)
PHARMACY - ANTICOAGULATION CONSULT   Pharmacy Consult for: Coumadin  Indication: H/o pulmonary embolism     Patient Data:   Allergies: No Known Allergies  Patient Measurements: Height: 6\' 2"  (188 cm) Weight: 360 lb 7.2 oz (163.5 kg) IBW/kg (Calculated) : 82.2    Vital Signs: Temp:  [98.2 F (36.8 C)-98.5 F (36.9 C)] 98.2 F (36.8 C) (02/27 0840) Pulse Rate:  [49-95] 93  (02/27 0859) Resp:  [12-28] 20  (02/27 0859) BP: (93-168)/(60-115) 133/76 mmHg (02/27 0859) SpO2:  [94 %-99 %] 98 % (02/27 0859) FiO2 (%):  [28 %-28.1 %] 28 % (02/27 0859) Weight:  [360 lb 7.2 oz (163.5 kg)] 360 lb 7.2 oz (163.5 kg) (02/27 0039)  Intake/Output from previous day:  Intake/Output Summary (Last 24 hours) at 03/15/11 1142 Last data filed at 03/15/11 0500  Gross per 24 hour  Intake    483 ml  Output    802 ml  Net   -319 ml    Labs:  Basename 03/15/11 0420 03/14/11 0418 03/13/11 1713 03/13/11 0515  HGB -- -- -- --  HCT -- -- -- --  PLT -- -- -- --  APTT -- -- -- --  LABPROT 25.7* 30.3* -- 28.3*  INR 2.30* 2.84* -- 2.60*  HEPARINUNFRC -- -- -- --  CREATININE 1.59* 1.55* 1.76* --  CKTOTAL -- -- -- --  CKMB -- -- -- --  TROPONINI -- -- -- --   Estimated Creatinine Clearance: 75.1 ml/min (by C-G formula based on Cr of 1.59).     Assessment:  66 y.o. male admitted on 03/09/2011, with h/o pulmonary embolism in February 2012. Pharmacy consulted to manage Coumadin. INR 2.3 today. PTA patient takes Coumadin 10mg  daily, except 5mg  on Monday and Thursdays. Will continue with home dose.   Goal of Therapy:  1. INR 2-3  Plan:  1. Coumadin 10mg  today per home dose 2. Check INR 03/17/11  Celedonio Miyamoto, PharmD, BCPS Clinical Pharmacist Pager [phone]

## 2011-03-15 NOTE — Progress Notes (Signed)
[facility] [place] TEAM 8  Subjective: 65 year-old male with history of diastolic CHF, OSA status post tracheostomy and uses ventilator at bedtime, history of PE on Coumadin, history of hypertension, and diabetes mellitus 2 presents to the ER because of increasing weakness for the last couple of days and getting short of breath with minimal exertion.  More alert today and states lumbar pain has improved and now is complaining of bilateral knee pain. Patient feels mobility has also improved and when discussed he feels was related to extensive lower extremity edema causing difficulty in leg movement. He denies symptoms such as numbness weakness or tingling or shooting pains down either or both legs. He states he is able to feel his feet.  Objective: Weight change: 0.2 kg (7.1 oz)  Intake/Output Summary (Last 24 hours) at 03/15/11 1238 Last data filed at 03/15/11 0500  Gross per 24 hour  Intake    483 ml  Output    802 ml  Net   -319 ml   Blood pressure 133/76, pulse 93, temperature 98.2 F (36.8 C), temperature source Oral, resp. rate 20, height 6\' 2"  (1.88 m), weight 163.5 kg (360 lb 7.2 oz), SpO2 98.00%.  Physical Exam: General: No acute respiratory distress-resting comfortably. Able to vocalize by placing finger over opening of tracheostomy tube Lungs: Clear to auscultation bilaterally without wheezes or crackles-distant BS throughout, midline cuffed trach patent- trach collar in place (has a Shiley 7.0 cuffed XLT Proximal trach tube) Cardiovascular: Regular rate and rhythm without murmur gallop or rub - heart sounds are quite distant Abdomen: Nontender, nondistended, soft, bowel sounds positive, no rebound, no ascites, no appreciable mass - morbidly obese Extremities: 1+ B LE edema Neuro:  4+/5 strength bilateral lower extremities - no appreciable hyperreflexia  Lab Results:  Basename 03/15/11 0420 03/14/11 0418 03/13/11 1713  NA 139 138 136  K 4.5 3.6 3.8  CL 96 95*  95*  CO2 36* 35* 34*  GLUCOSE 133* 136* 177*  BUN 37* 40* 43*  CREATININE 1.59* 1.55* 1.76*  CALCIUM 10.0 9.6 9.4  MG -- 2.5 2.3  PHOS -- -- --   Micro Results: Recent Results (from the past 240 hour(s))  MRSA PCR SCREENING     Status: Abnormal   Collection Time   03/10/11  6:24 AM      Component Value Range Status Comment   MRSA by PCR POSITIVE (*) NEGATIVE  Final     Studies/Results: All recent x-ray/radiology reports have been reviewed in detail.   Medications: I have reviewed the patient's complete medication list.  Assessment/Plan:  Acute exacerbation of chronic diastolic CHF *Last echo in 2009 revealed EF 65% - followed by [facility]) - was well compensated at last visit (02/28/11)  *fluid balance is negative 8L since admit and BUN/creatinine rising therefore will stop Lasix *The patient admitted to the cardiologist's of dietary indiscretion regarding salty snacks. Patient counseled against the side of snacks after discharge   PVC bigeminy/nonsustained ventricular tachycardia *Had 24 beat run VT on 03/13/2011 while asleep and no symptoms and since that time has had no further recurrence of nonsustained ventricular tachycardia *Magnesium level was 2.3 and potassium level was less than 4.0. Current potassium is 4 point *Appreciate cardiology assistance *2-D echocardiogram this admission showed preserved LV systolic function, no mention of diastolic dysfunction *Hydrochlorothiazide was discontinued. Clonidine dosage was decreased in favor of increasing beta blocker dosage.  OSA s/p trach w/ QHS vent  *PCCM aware and to see PRN for vent  monitoring - have resumed home vent settings as clarified by RT  *discussed trach change with PCCM on 03/13/2011  hypokalemia *Cardiology recommends keeping potassium greater than or equal to 4.0 in setting of recent nonsustained ventricular tachycardia *Today potassium is 4.5-continue oral replete but will likely need to briefly  decrease dosage since home dosage was utilized in conjunction with Lasix and hydrochlorothiazide  DM2 *Reasonably well controlled at present  *Continue Lantus insulin as well as sliding scale insulin  Bilateral lower extremity weakness *Etiology unclear and no definitive neurological deficits on clinical exam but clinical exam also limited by patient's obesity *As of 03/15/2011 weakness has markedly improved and this appears to correlate with resolution of the patient's torture any edema. He has no neurological signs or symptoms so MRI has been canceled  hx of PE *Compliance has been a complicating issue  *Pharmacy managing warfarin dosing  Obesity  HTN *Well controlled at present  *Continue clonidine, Olmesartan and Coreg  Hx of cough on ACE Inhib *Is tolerating an ARB w/o difficulty at present  Remote hx of EtOH and tobacco abuse *Denies current use of either   Disposition *Will remain in step down until medically stable for discharge due to requirement of nocturnal mechanical ventilation   Junious Silk ANP [facility] Office  [phone] Pager [phone]  On-Call/Text Page:      [email or web address]      password [email or web address]  I have personally examined this patient and reviewed the entire database. I have reviewed the above note, made any necessary editorial changes, and agree with its content.  Lonia Blood, MD [facility]

## 2011-03-15 NOTE — Progress Notes (Signed)
Pt tolerated vent well all night. Placed back on 28% ATC at this time. No complications noted.

## 2011-03-16 LAB — BASIC METABOLIC PANEL
BUN: 35 mg/dL — ABNORMAL HIGH (ref 6–23)
Calcium: 9.7 mg/dL (ref 8.4–10.5)
GFR calc non Af Amer: 48 mL/min — ABNORMAL LOW (ref >90)
Glucose, Bld: 107 mg/dL — ABNORMAL HIGH (ref 70–99)
Sodium: 136 mEq/L (ref 135–145)

## 2011-03-16 LAB — GLUCOSE, CAPILLARY: Glucose-Capillary: 102 mg/dL — ABNORMAL HIGH (ref 70–99)

## 2011-03-16 MED ORDER — GUAIFENESIN ER 600 MG PO TB12
600.0000 mg | ORAL_TABLET | Freq: Two times a day (BID) | ORAL | Status: DC | PRN
  Filled 2011-03-16: qty 1

## 2011-03-16 MED ORDER — LOPERAMIDE HCL 2 MG PO CAPS
2.0000 mg | ORAL_CAPSULE | Freq: Three times a day (TID) | ORAL | Status: DC | PRN
  Administered 2011-03-17: 2 mg via ORAL
  Filled 2011-03-16: qty 1

## 2011-03-16 MED ORDER — FUROSEMIDE 40 MG PO TABS
40.0000 mg | ORAL_TABLET | Freq: Every day | ORAL | Status: DC
  Administered 2011-03-16 – 2011-03-17 (×2): 40 mg via ORAL
  Filled 2011-03-16 (×3): qty 1

## 2011-03-16 NOTE — Procedures (Signed)
Attempted to change out pt's trach to #7 XLT cuffed trach which is the same trach the pt currently has due to be changed. RT unable to get the current trach out of the stoma after several attempts. MD aware. Pt stated that an ENT doctor is the one that routinely changes his trach every 2 months and it was now a month overdue to be changed.

## 2011-03-16 NOTE — Progress Notes (Signed)
SUBJECTIVE:   Breathing better.  No distress.  PHYSICAL EXAM Filed Vitals:   03/16/11 0751 03/16/11 0758 03/16/11 0800 03/16/11 0807  BP: 132/92 132/92    Pulse: 86  64   Temp: 98 F (36.7 C)     TempSrc: Oral     Resp: 23  20   Height:      Weight:      SpO2: 98%  98% 97%   General:  No distress Lungs:  Decreased BS, no crackles Heart:  RRR Abdomen:  Positive bowel sounds, no rebound no guarding Extremities:  Mild edema  LABS: Lab Results  Component Value Date   CKTOTAL 141 03/10/2011   CKMB 2.0 03/10/2011   TROPONINI <0.30 03/10/2011   Results for orders placed during the hospital encounter of 03/09/11 (from the past 24 hour(s))  GLUCOSE, CAPILLARY     Status: Abnormal   Collection Time   03/15/11 12:27 PM      Component Value Range   Glucose-Capillary 135 (*) 70 - 99 (mg/dL)   Comment 1 Notify RN    GLUCOSE, CAPILLARY     Status: Abnormal   Collection Time   03/15/11  5:10 PM      Component Value Range   Glucose-Capillary 108 (*) 70 - 99 (mg/dL)   Comment 1 Notify RN    GLUCOSE, CAPILLARY     Status: Abnormal   Collection Time   03/15/11  9:50 PM      Component Value Range   Glucose-Capillary 141 (*) 70 - 99 (mg/dL)   Comment 1 Documented in Chart     Comment 2 Notify RN    BASIC METABOLIC PANEL     Status: Abnormal   Collection Time   03/16/11  4:20 AM      Component Value Range   Sodium 136  135 - 145 (mEq/L)   Potassium 4.4  3.5 - 5.1 (mEq/L)   Chloride 97  96 - 112 (mEq/L)   CO2 32  19 - 32 (mEq/L)   Glucose, Bld 107 (*) 70 - 99 (mg/dL)   BUN 35 (*) 6 - 23 (mg/dL)   Creatinine, Ser 1.47 (*) 0.50 - 1.35 (mg/dL)   Calcium 9.7  8.4 - 82.9 (mg/dL)   GFR calc non Af Amer 48 (*) >90 (mL/min)   GFR calc Af Amer 55 (*) >90 (mL/min)  GLUCOSE, CAPILLARY     Status: Normal   Collection Time   03/16/11  7:49 AM      Component Value Range   Glucose-Capillary 90  70 - 99 (mg/dL)   Comment 1 Documented in Chart     Comment 2 Notify RN      Intake/Output  Summary (Last 24 hours) at 03/16/11 0843 Last data filed at 03/16/11 0800  Gross per 24 hour  Intake   1013 ml  Output   1077 ml  Net    -64 ml    ASSESSMENT AND PLAN:   1)  CHF (congestive heart failure):  Diastolic dysfunction.  Volume difficult to assess but pro BNP is markedly elevated during this admission.  Weight up slightly yesterday.  Creat appears to be stable.  I will restart  PO Lasix.  He should have an outpatient BMET within five days of discharge.  He should have follow up in our clinic within 7 days of discharge.  Please let us know before timing of discharge so that we can arrange this.   2)   NSVT (nonsustained ventricular tachycardia):  Telemetry with ectopy but no sustained VTach.   No change in therapy.  Fayrene Fearing [facility] 03/16/2011 8:43 AM

## 2011-03-17 DIAGNOSIS — Z930 Tracheostomy status: Secondary | ICD-10-CM

## 2011-03-17 DIAGNOSIS — I5033 Acute on chronic diastolic (congestive) heart failure: Secondary | ICD-10-CM | POA: Diagnosis present

## 2011-03-17 DIAGNOSIS — G4733 Obstructive sleep apnea (adult) (pediatric): Secondary | ICD-10-CM

## 2011-03-17 LAB — PROTIME-INR
INR: 2.24 — ABNORMAL HIGH (ref 0.00–1.49)
Prothrombin Time: 25.2 seconds — ABNORMAL HIGH (ref 11.6–15.2)

## 2011-03-17 LAB — BASIC METABOLIC PANEL
Calcium: 9.8 mg/dL (ref 8.4–10.5)
GFR calc non Af Amer: 53 mL/min — ABNORMAL LOW (ref >90)
Glucose, Bld: 104 mg/dL — ABNORMAL HIGH (ref 70–99)
Sodium: 140 mEq/L (ref 135–145)

## 2011-03-17 MED ORDER — CLONIDINE HCL 0.1 MG PO TABS: 0.1000 mg | ORAL_TABLET | Freq: Two times a day (BID) | ORAL | Status: DC

## 2011-03-17 MED ORDER — CARVEDILOL 6.25 MG PO TABS: 6.2500 mg | ORAL_TABLET | Freq: Two times a day (BID) | ORAL | Status: DC

## 2011-03-17 NOTE — Progress Notes (Signed)
Physical Therapy Treatment Patient Details Name: Travis Chapman MRN: [id number] DOB: 11/17/1945 Today's Date: 03/17/2011  PT Assessment/Plan  PT - Assessment/Plan Comments on Treatment Session: Pt with CHF with marked improvement today able to ambulate in hall on RA and perform all basic transfers with S and increased time. On RA sats 94-100 throughout amb. Pt encouraged to continue ambulating with nursing and performing HEP as able to maintain strength.  PT Plan: Discharge plan needs to be updated Follow Up Recommendations: Home health PT;Supervision for mobility/OOB Equipment Recommended: None recommended by PT PT Goals  Acute Rehab PT Goals PT Goal: Supine/Side to Sit - Progress: Met PT Goal: Sit to Supine/Side - Progress: Met Pt will go Sit to Stand: with modified independence PT Goal: Sit to Stand - Progress: Updated due to goal met Pt will go Stand to Sit: with modified independence PT Goal: Stand to Sit - Progress: Updated due to goals met Pt will Ambulate: 51 - 150 feet;with modified independence;with least restrictive assistive device PT Goal: Ambulate - Progress: Updated due to goal met PT Goal: Perform Home Exercise Program - Progress: Progressing toward goal  PT Treatment Precautions/Restrictions  Precautions Precautions: Fall Required Braces or Orthoses: No Restrictions Weight Bearing Restrictions: No Mobility (including Balance) Bed Mobility Bed Mobility: Yes Supine to Sit: 6: Modified independent (Device/Increase time);HOB elevated (Comment degrees) (HOB 15degrees) Sit to Supine: 6: Modified independent (Device/Increase time);HOB elevated (comment degrees) (HOB 15degrees) Transfers Sit to Stand: 5: Supervision;From bed;From chair/3-in-1 Sit to Stand Details (indicate cue type and reason): cueing for safety  Stand to Sit: 5: Supervision;To chair/3-in-1;To bed Ambulation/Gait Ambulation/Gait Assistance: 5: Supervision Ambulation/Gait Assistance Details (indicate cue  type and reason): increased BOS Ambulation Distance (Feet): 80 Feet Assistive device: Rolling walker Gait Pattern: Decreased stride length;Trunk flexed;Step-through pattern Stairs: No  Posture/Postural Control Posture/Postural Control: No significant limitations Exercise  General Exercises - Lower Extremity Long Arc Quad: AROM;Both;15 reps;Seated Hip Flexion/Marching: AROM;Both;Seated;Other reps (comment) (15reps) End of Session PT - End of Session Activity Tolerance: Patient tolerated treatment well Patient left: in chair;with call bell in reach Nurse Communication: Mobility status for transfers;Mobility status for ambulation General Behavior During Session: [facility] for tasks performed Cognition: [facility] for tasks performed  Delorse Lek 03/17/2011, 11:44 AM Toney Sang, PT [phone]

## 2011-03-17 NOTE — Discharge Summary (Signed)
DISCHARGE SUMMARY  Travis Chapman  MR#: [id number]  DOB:1945-02-08  Date of Admission: 03/09/2011 Date of Discharge: 03/17/2011  Attending Physician:Travis Chapman  Patient's WUJ:[id number], Travis Signs, MD, MD  Consults:Treatment Team:  Travis Ancona, MD- [facility]  Presenting Complaint: Shortness of breath and weakness  Discharge Diagnoses:  Principal Problem: Acute exacerbation of chronic diastolic CHF  Active Problems:  NSVT (nonsustained ventricular tachycardia)  DM  HYPERTENSION  Pulmonary embolism  Weakness  PVC (premature ventricular contraction)  OSA  With trach dependance Morbid obesity   Discharge Medications: Medication List  As of 03/17/2011  1:43 PM   STOP taking these medications         amLODipine 10 MG tablet         TAKE these medications         aliskiren 150 MG tablet   Commonly known as: TEKTURNA   Take 75 mg by mouth daily.      ALPRAZolam 3 MG 24 hr tablet   Commonly known as: XANAX XR   Take 3 mg by mouth at bedtime.      carvedilol 6.25 MG tablet   Commonly known as: COREG   Take 1 tablet (6.25 mg total) by mouth 2 (two) times daily with a meal.      citalopram 40 MG tablet   Commonly known as: CELEXA   Take 40 mg by mouth daily.      cloNIDine 0.1 MG tablet   Commonly known as: CATAPRES   Take 1 tablet (0.1 mg total) by mouth 2 (two) times daily.      furosemide 40 MG tablet   Commonly known as: LASIX   Take 40 mg by mouth 2 (two) times daily.      hydrochlorothiazide 25 MG tablet   Commonly known as: HYDRODIURIL   Take 25 mg by mouth daily.      insulin glargine 100 UNIT/ML injection   Commonly known as: LANTUS   Inject 50 Units into the skin every morning.      metoCLOPramide 5 MG tablet   Commonly known as: REGLAN   Take 5 mg by mouth 3 (three) times daily.      multivitamin tablet   Take 1 tablet by mouth daily.      olmesartan 40 MG tablet   Commonly known as: BENICAR   Take 40 mg by mouth daily.      oxyCODONE  15 MG Tb12   Commonly known as: OXYCONTIN   Take 15 mg by mouth 4 (four) times daily as needed. For pain.      potassium chloride SA 20 MEQ tablet   Commonly known as: K-DUR,KLOR-CON   Take 40 mEq by mouth daily.      simvastatin 20 MG tablet   Commonly known as: ZOCOR   Take 20 mg by mouth every evening.      warfarin 10 MG tablet   Commonly known as: COUMADIN   Take 5-10 mg by mouth daily. Take 1 tablet (10 MG) every day except Mondays and Thursdays. On Mondays and Thursdays take 0.5 tablets (5 MG)             Procedures: Dg Chest 2 View  03/10/2011  *RADIOLOGY REPORT*  Clinical Data: Weakness; shortness of breath.  Inability to ambulate.  History of asthma and diabetes.  CHEST - 2 VIEW  Comparison: Chest radiograph performed 06/28/2005  Findings: The patient's tracheostomy tube is seen ending 6-7 cm above the carina.  The lungs are well-aerated.  Vascular congestion  is noted, with question of mild interstitial edema, especially on correlation with the lateral view.  There is no evidence of pleural effusion or pneumothorax.  The heart remains significantly enlarged; the mediastinal contour is within normal limits.  No acute osseous abnormalities are seen.  IMPRESSION: Vascular congestion and significant cardiomegaly, with concern for mild pulmonary edema, particularly on the lateral view.  Original Report Authenticated By: Travis Chapman, M.D.    Echocardiogram 03/14/11 Study Conclusions  - Left ventricle: Overall poor image quality. PVC;s during exam Abnormal septal motion with ectopy The cavity size was normal. Wall thickness was normal. Systolic function was normal. The estimated ejection fraction was in the range of 50% to 55%. - Atrial septum: No defect or patent foramen ovale was identified.   Hospital Course: Principal Problem:  *Diastolic CHF, acute on chronic 66 year-old male with history of diastolic CHF, OSA status post tracheostomy who uses ventilator at bedtime,  history of PE on Coumadin, history of hypertension, and diabetes mellitus 2 presents to the ER because of increasing weakness for the last couple of days and getting short of breath with minimal exertion. He was found to have acute on chronic diastolic CHF and was admitted for diuresis. He has diuresed well and is now back on room air with good O2 saturations.   NSVT (nonsustained ventricular tachycardia) A consult with [facility] [facility] was also requested and a repeat ECHO was performed (results above).  His Clonidine dose was subsequentially reduced and Coreg increased by Cardiology. He has not had any further arrhythmias although he does have isolated PVCs.   Active Problems:  DM  HYPERTENSION  Pulmonary embolism  Weakness He has been evaluated by PT and requires home health PT which we have arranged for him.   OSA (obstructive sleep apnea)  Tracheostomy dependent  Obesities, morbid Of note respiratory attempted to change his trach during the hospital stay. He states he has it changed by an ENT doctor in [place] every 2 months but it has been 3 months since the last trach change. The trach was quite tight and respiratory recommended that it be changed by and ENT physician. I did call ENT and spoke with Dr Travis Chapman who recommended that the patient return to his regular physician for this trach change. This has been conveyed to the patient and he is in agreement with following up with his doctor after being discharged.   Day of Discharge Physical Exam: BP 103/59  Pulse 59  Temp(Src) 98.1 F (36.7 C) (Oral)  Resp 21  Ht 6\' 2"  (1.88 m)  Wt 166 kg (365 lb 15.4 oz)  BMI 46.99 kg/m2  SpO2 95% General: No acute respiratory distress-resting comfortably. Able to vocalize by placing finger over opening of tracheostomy tube  Lungs: Clear to auscultation bilaterally without wheezes or crackles-distant BS throughout, midline cuffed trach patent- trach collar in place (has a Shiley 7.0 cuffed XLT  Proximal trach tube)  Cardiovascular: Regular rate and rhythm without murmur gallop or rub - heart sounds are quite distant  Abdomen: Nontender, nondistended, soft, bowel sounds positive, no rebound, no ascites, no appreciable mass - morbidly obese  Extremities: 1+ B LE edema  Neuro: 5/5 strength bilateral lower extremities - no appreciable hyperreflexia   Results for orders placed during the hospital encounter of 03/09/11 (from the past 24 hour(s))  GLUCOSE, CAPILLARY     Status: Abnormal   Collection Time   03/16/11  5:23 PM      Component Value Range   Glucose-Capillary  102 (*) 70 - 99 (mg/dL)   Comment 1 Documented in Chart     Comment 2 Notify RN    PROTIME-INR     Status: Abnormal   Collection Time   03/17/11  4:30 AM      Component Value Range   Prothrombin Time 25.2 (*) 11.6 - 15.2 (seconds)   INR 2.24 (*) 0.00 - 1.49   BASIC METABOLIC PANEL     Status: Abnormal   Collection Time   03/17/11  4:30 AM      Component Value Range   Sodium 140  135 - 145 (mEq/L)   Potassium 4.3  3.5 - 5.1 (mEq/L)   Chloride 100  96 - 112 (mEq/L)   CO2 31  19 - 32 (mEq/L)   Glucose, Bld 104 (*) 70 - 99 (mg/dL)   BUN 29 (*) 6 - 23 (mg/dL)   Creatinine, Ser 1.61 (*) 0.50 - 1.35 (mg/dL)   Calcium 9.8  8.4 - 09.6 (mg/dL)   GFR calc non Af Amer 53 (*) >90 (mL/min)   GFR calc Af Amer 61 (*) >90 (mL/min)  GLUCOSE, CAPILLARY     Status: Normal   Collection Time   03/17/11  8:42 AM      Component Value Range   Glucose-Capillary 95  70 - 99 (mg/dL)  GLUCOSE, CAPILLARY     Status: Abnormal   Collection Time   03/17/11 12:11 PM      Component Value Range   Glucose-Capillary 121 (*) 70 - 99 (mg/dL)    Disposition: stable   Follow-up Appts: Discharge Orders    Future Orders Please Complete By Expires   Diet - low sodium heart healthy      Comments:   And diabetic   Increase activity slowly         Follow-up with Dr.Ellison, [name] in 1 weeks. Dr Delfin Edis in 1/2 days for trach change Dr Antoine Poche  [facility]) in 1-2 wks.   Time on Discharge: >45 min  Signed: Shravan Salahuddin 03/17/2011, 1:43 PM

## 2011-03-17 NOTE — Progress Notes (Signed)
OT Cancellation Note  Treatment cancelled today due to Pt. is preparing to discharge home.  Boykin Reaper [phone] 03/17/2011, 2:28 PM

## 2011-03-17 NOTE — Progress Notes (Signed)
PHARMACY - ANTICOAGULATION CONSULT   Pharmacy Consult for: Coumadin  Indication: H/o pulmonary embolism     Patient Data:   Allergies: No Known Allergies  Patient Measurements: Height: 6\' 2"  (188 cm) Weight: 365 lb 15.4 oz (166 kg) IBW/kg (Calculated) : 82.2    Vital Signs: Temp:  [97.7 F (36.5 C)-98.2 F (36.8 C)] 98.1 F (36.7 C) (03/01 0516) Pulse Rate:  [66-88] 85  (03/01 0841) Resp:  [15-23] 18  (03/01 0841) BP: (111-150)/(59-95) 150/74 mmHg (03/01 0841) SpO2:  [90 %-100 %] 97 % (03/01 0841) FiO2 (%):  [27.7 %-28 %] 28 % (03/01 0841) Weight:  [365 lb 15.4 oz (166 kg)] 365 lb 15.4 oz (166 kg) (03/01 0516)  Intake/Output from previous day:  Intake/Output Summary (Last 24 hours) at 03/17/11 0846 Last data filed at 03/17/11 0400  Gross per 24 hour  Intake    513 ml  Output    901 ml  Net   -388 ml    Labs:  Basename 03/17/11 0430 03/16/11 0420 03/15/11 0420  HGB -- -- --  HCT -- -- --  PLT -- -- --  APTT -- -- --  LABPROT 25.2* -- 25.7*  INR 2.24* -- 2.30*  HEPARINUNFRC -- -- --  CREATININE 1.37* 1.49* 1.59*  CKTOTAL -- -- --  CKMB -- -- --  TROPONINI -- -- --   Estimated Creatinine Clearance: 88 ml/min (by C-G formula based on Cr of 1.37).     Assessment:  66 y.o. male admitted on 03/09/2011, with h/o pulmonary embolism in February 2012. Pharmacy consulted to manage Coumadin. INR 2.24 today. PTA patient takes Coumadin 10mg  daily, except 5mg  on Monday and Thursdays. Will continue with home dose.   Goal of Therapy:  1. INR 2-3  Plan:  1. Coumadin 10mg  today per home dose

## 2011-03-17 NOTE — Discharge Instructions (Signed)
Heart Failure Heart failure (HF) is a condition in which the heart has trouble pumping blood. This means your heart does not pump blood efficiently for your body to work well. In some cases of HF, fluid may back up into your lungs or you may have swelling (edema) in your lower legs. HF is a long-term (chronic) condition. It is important for you to take good care of yourself and follow your caregiver's treatment plan. CAUSES   Health conditions:   High blood pressure (hypertension) causes the heart muscle to work harder than normal. When pressure in the blood vessels is high, the heart needs to pump (contract) with more force in order to circulate blood throughout the body. High blood pressure eventually causes the heart to become stiff and weak.   Coronary artery disease (CAD) is the buildup of cholesterol and fat (plaques) in the arteries of the heart. The blockage in the arteries deprives the heart muscle of oxygen and blood. This can cause chest pain and may lead to a heart attack. High blood pressure can also contribute to CAD.   Heart attack (myocardial infarction) occurs when 1 or more arteries in the heart become blocked. The loss of oxygen damages the muscle tissue of the heart. When this happens, part of the heart muscle dies. The injured tissue does not contract as well and weakens the heart's ability to pump blood.   Abnormal heart valves can cause HF when the heart valves do not open and close properly. This makes the heart muscle pump harder to keep the blood flowing.   Heart muscle disease (cardiomyopathy or myocarditis) is damage to the heart muscle from a variety of causes. These can include drug or alcohol abuse, infections, or unknown reasons. These can increase the risk of HF.   Lung disease makes the heart work harder because the lungs do not work properly. This can cause a strain on the heart leading it to fail.   Diabetes increases the risk of HF. High blood sugar contributes  to high fat (lipid) levels in the blood. Diabetes can also cause slow damage to tiny blood vessels that carry important nutrients to the heart muscle. When the heart does not get enough oxygen and food, it can cause the heart to become weak and stiff. This leads to a heart that does not contract efficiently.   Other diseases can contribute to HF. These include abnormal heart rhythms, thyroid problems, and low blood counts (anemia).   Unhealthy lifestyle habits:   Obesity.   Smoking.   Eating foods high in fat and cholesterol.   Eating or drinking beverages high in salt.   Drug or alcohol abuse.   Lack of exercise.  SYMPTOMS  HF symptoms may vary and can be hard to detect. Symptoms may include:  Shortness of breath with activity, such as climbing stairs.   Persistent cough.   Swelling of the feet, ankles, legs, or abdomen.   Unexplained weight gain.   Difficulty breathing when lying flat.   Waking from sleep because of the need to sit up and get more air.   Rapid heartbeat.   Fatigue and loss of energy.   Feeling lightheaded or close to fainting.  DIAGNOSIS  A diagnosis of HF is based on your history, symptoms, physical examination, and diagnostic tests. Diagnostic tests for HF may include:  EKG.   Chest X-ray.   Blood tests.   Exercise stress test.   Blood oxygen test (arterial blood gas).   Evaluation   by a heart doctor (cardiologist).   Ultrasound evaluation of the heart (echocardiogram).   Heart artery test to look for blockages (angiogram).   Radioactive imaging to look at the heart (radionuclide test).  TREATMENT  Treatment is aimed at managing the symptoms of HF. Medicines, lifestyle changes, or surgical intervention may be necessary to treat HF.  Medicines to help treat HF may include:   Angiotensin-converting enzyme (ACE) inhibitors. These block the effects of a blood protein called angiotensin-converting enzyme. ACE inhibitors relax (dilate) the  blood vessels and help lower blood pressure. This decreases the workload of the heart, slows the progression of HF, and improves symptoms.   Angiotensin receptor blockers (ARBs). These medications work similar to ACE inhibitors. ARBs may be an alternative for people who cannot tolerate an ACE inhibitor.   Aldosterone antagonists. This medication helps get rid of extra fluid from your body. This lowers the volume of blood the heart has to pump.   Water pills (diuretics). Diuretics cause the kidneys to remove salt and water from the blood. The extra fluid is removed by urination. By removing extra fluid from the body, diuretics help lower the workload of the heart and help prevent fluid buildup in the lungs so breathing is easier.   Beta blockers. These prevent the heart from beating too fast and improve heart muscle strength. Beta blockers help maintain a normal heart rate, control blood pressure, and improve HF symptoms.   Digitalis. This increases the force of the heartbeat and may be helpful to people with HF or heart rhythm problems.   Healthy lifestyle changes include:   Stopping smoking.   Eating a healthy diet. Avoid foods high in fat. Avoid foods fried in oil or made with fat. A dietician can help with healthy food choices.   Limiting how much salt you eat.   Limiting alcohol intake to no more than 1 drink per day for women and 2 drinks per day for men. Drinking more than that is harmful to your heart. If your heart has already been damaged by alcohol or you have severe HF, drinking alcohol should be stopped completely.   Exercising as directed by your caregiver.   Surgical treatment for HF may include:   Procedures to open blocked arteries, repair damaged heart valves, or remove damaged heart muscle tissue.   A pacemaker to help heart muscle function and to control certain abnormal heart rhythms.   A defibrillator to possibly prevent sudden cardiac death.  HOME CARE  INSTRUCTIONS   Activity level. Your caregiver can help you determine what type of exercise program may be helpful. It is important to maintain your strength. Pace your physical activity to avoid shortness of breath or chest pain. Rest for 1 hour before and after meals. A cardiac rehabilitation program may be helpful to some people with HF.   Diet. Eat a heart healthy diet. Food choices should be low in saturated fat and cholesterol. Talk to a dietician to learn about heart healthy foods.   Salt intake. When you have HF, you need to limit the amount of salt you eat. Eat less than 1500 milligrams (mg) of salt per day or as recommended by your caregiver.   Weight monitoring. Weigh yourself every day. You should weigh yourself in the morning after you urinate and before you eat breakfast. Wear the same amount of clothing each time you weigh yourself. Record your weight daily. Bring your recorded weights to your clinic visits. Tell your caregiver right away if   you have gained 3 lb/1.4 kg in 1 day, or 5 lb/2.3 kg in a week or whatever amount you were told to report.   Blood pressure monitoring. This should be done as directed by your caregiver. A home blood pressure cuff can be purchased at a drugstore. Record your blood pressure numbers and bring them to your clinic visits. Tell your caregiver if you become dizzy or lightheaded upon standing up.   Smoking. If you are currently a smoker, it is time to quit. Nicotine makes your heart work harder by causing your blood vessels to constrict. Do not use nicotine gum or patches before talking to your caregiver.   Follow up. Be sure to schedule a follow-up visit with your caregiver. Keep all your appointments.  SEEK MEDICAL CARE IF:   Your weight increases by 3 lb/1.4 kg in 1 day or 5 lb/2.3 kg in a week.   You notice increasing shortness of breath that is unusual for you. This may happen during rest, sleep, or with activity.   You cough more than normal,  especially with physical activity.   You notice more swelling in your hands, feet, ankles, or belly (abdomen).   You are unable to sleep because it is hard to breathe.   You cough up bloody mucus (sputum).   You begin to feel "jumping" or "fluttering" sensations (palpitations) in your chest.  SEEK IMMEDIATE MEDICAL CARE IF:   You have severe chest pain or pressure which may include symptoms such as:   Pain or pressure in the arms, neck, jaw, or back.   Feeling sweaty.   Feeling sick to your stomach (nauseous).   Feeling short of breath while at rest.   Having a fast or irregular heartbeat.   You experience stroke symptoms. These symptoms include:   Facial weakness or numbness.   Weakness or numbness in an arm, leg, or on one side of your body.   Blurred vision.   Difficulty talking or thinking.   Dizziness or fainting.   Severe headache.  THESE ARE MEDICAL EMERGENCIES. Do not wait to see if the symptoms go away. Call your local emergency services (911 in [place] DO NOT drive yourself to the hospital. IMPORTANT  Make a list of every medicine, vitamin, or herbal supplement you are taking. Keep the list with you at all times. Show it to your caregiver at every visit. Keep the list up-to-date.   Ask your caregiver or pharmacist to write an explanation of each medicine you are taking. This should include:   Why you are taking it.   The possible side effects.   The best time of day to take it.   Foods to take with it or what foods to avoid.   When to stop taking it.  MAKE SURE YOU:   Understand these instructions.   Will watch your condition.   Will get help right away if you are not doing well or get worse.  Document Released: 01/02/2005 Document Revised: 09/14/2010 Document Reviewed: 04/16/2009 Exit[employer] Patient Information 2012 [employer]. 

## 2011-03-17 NOTE — Progress Notes (Signed)
Order to d/c patient.  Discussed concerns about pt lack of strength with patient and Dr. Butler Denmark.  Patient states he wants to go home and feels he is able to take care of himself.  [facility] from [facility].  IV removed without problems.  Pt given written d/c instructions including information on medications and CHF.  Reviewed all d/c instructions with patient and daughter.  Discussed f/u appt and when to call MD.  Pt and daughter verbalized understanding.  Given RX and discussed which medications to take this evening, also marked on instructions.  Pt verbalized understanding.  D/ced via wc with daughter.

## 2011-03-20 LAB — GLUCOSE, CAPILLARY: Glucose-Capillary: 124 mg/dL — ABNORMAL HIGH (ref 70–99)

## 2011-03-20 NOTE — Progress Notes (Signed)
   CARE MANAGEMENT NOTE 03/20/2011  Patient:  Travis Chapman, Travis Chapman   Account Number:  [id number]  Date Initiated:  03/20/2011  Documentation initiated by:  Onnie Boer  Subjective/Objective Assessment:   PT WAS ADMITTED WITH CHF AND OVERLOAD.     Action/Plan:   PROGRESSION OF CARE AND DISCHARGE PLANNING   Anticipated DC Date:  03/17/2011   Anticipated DC Plan:  HOME W HOME HEALTH SERVICES      DC Planning Services  CM consult      Choice offered to / List presented to:     DME arranged  WALKER - Lavone Nian      DME agency  [facility]     HH arranged  HH-2 PT  HH-3 OT  HH-1 RN      [facility] agency  [facility]   Status of service:  Completed, signed off Medicare Important Message given?   (If response is "NO", the following Medicare IM given date fields will be blank) Date Medicare IM given:   Date Additional Medicare IM given:    Discharge Disposition:  HOME W HOME HEALTH SERVICES  Per UR Regulation:  Reviewed for med. necessity/level of care/duration of stay  Comments:  03/17/11 Onnie Boer, RN,BSN 0945 PT WAS ADMITTED WITH OVERLOAD AND WAS FROM HOME WITH A TRACH AND HS VENT PLACEMENT.  PT HAS [facility] PROVIDING VENT AND TRACH SUPPLIES.  PT WAS DC TO HOME WITH [facility] PT/OT/RN.

## 2011-03-21 ENCOUNTER — Encounter: Payer: Self-pay | Admitting: Endocrinology

## 2011-03-21 ENCOUNTER — Ambulatory Visit (INDEPENDENT_AMBULATORY_CARE_PROVIDER_SITE_OTHER): Payer: Medicare Other | Admitting: Endocrinology

## 2011-03-21 VITALS — BP 92/70 | HR 65 | Temp 98.3°F | Wt 366.0 lb

## 2011-03-21 DIAGNOSIS — N058 Unspecified nephritic syndrome with other morphologic changes: Secondary | ICD-10-CM

## 2011-03-21 DIAGNOSIS — E1029 Type 1 diabetes mellitus with other diabetic kidney complication: Secondary | ICD-10-CM

## 2011-03-21 MED ORDER — LACTULOSE 20 GM/30ML PO SOLN: 30.0000 mL | Freq: Three times a day (TID) | ORAL | Status: DC

## 2011-03-21 NOTE — Progress Notes (Signed)
Subjective:    Patient ID: Travis Chapman, male    DOB: Oct 10, 1945, 66 y.o.   MRN: [id number]  HPI The state of at least three ongoing medical problems is addressed today: Pt returns for f/u of insulin-requiting DM (1992).  no cbg record, but states cbg's are sometimes low.   Pt was recently hospitalized for CHF exac.  Sob is much better now.   Pt says constipation persists.  He presumes this is due to narcotics.   Past Medical History  Diagnosis Date  . ANXIETY 07/23/2006  . ASTHMA 07/23/2006  . DM 05/23/2007  . HYPERTENSION 07/23/2006  . OSTEOARTHRITIS 07/23/2006  . HYPERCHOLESTEROLEMIA 05/23/2007  . ANEMIA 08/31/2008  . Palpitations 02/10/2008  . ALCOHOL ABUSE, IN REMISSION, HX OF 08/26/2008  . TOBACCO ABUSE, HX OF 08/26/2008  . Pulmonary embolism 03/16/2010  . ED (erectile dysfunction)   . Morbid obesity   . DM retinopathy   . 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 DM neuropathy with neurologic complication   . CHF (congestive heart failure)     With a preserved EF  . 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 Sleep apnea   . Shortness of breath   . CARCINOMA, PROSTATE 09/29/2009    Past Surgical History  Procedure Date  . Back surgery   . Tracheostomy     History   Social History  . Marital Status: Divorced    Spouse Name: N/A    Number of Children: N/A  . Years of Education: N/A   Occupational History  . Disabled    Social History Main Topics  . Smoking status: Former Smoker    Quit date: 01/16/2005  . Smokeless tobacco: Not on file  . Alcohol Use: No  . Drug Use: Not on file  . Sexually Active: Not on file   Other Topics Concern  . Not on file   Social History Narrative   SingleLives with his elderly mother    Current Outpatient Prescriptions on File Prior to Visit  Medication Sig Dispense Refill  . ALPRAZolam (XANAX XR) 3 MG 24 hr tablet Take 3 mg by mouth at bedtime.      .  carvedilol (COREG) 6.25 MG tablet Take 1 tablet (6.25 mg total) by mouth 2 (two) times daily with a meal.  60 tablet  0  . citalopram (CELEXA) 40 MG tablet Take 40 mg by mouth daily.      . cloNIDine (CATAPRES) 0.1 MG tablet Take 1 tablet (0.1 mg total) by mouth 2 (two) times daily.  60 tablet  0  . furosemide (LASIX) 40 MG tablet Take 40 mg by mouth 2 (two) times daily.      . hydrochlorothiazide (HYDRODIURIL) 25 MG tablet Take 25 mg by mouth daily.      . insulin glargine (LANTUS) 100 UNIT/ML injection Inject 40 Units into the skin every morning.       . metoCLOPramide (REGLAN) 5 MG tablet Take 5 mg by mouth 3 (three) times daily.      . Multiple Vitamin (MULTIVITAMIN) tablet Take 1 tablet by mouth daily.        Marland Kitchen olmesartan (BENICAR) 40 MG tablet Take 40 mg by mouth daily.      Marland Kitchen oxyCODONE (OXYCONTIN) 15 MG TB12 Take 15 mg by mouth 4 (four) times daily as needed. For pain.      . potassium  chloride SA (K-DUR,KLOR-CON) 20 MEQ tablet Take 40 mEq by mouth daily.      . simvastatin (ZOCOR) 20 MG tablet Take 20 mg by mouth every evening.      . warfarin (COUMADIN) 10 MG tablet Take 5-10 mg by mouth daily. Take 1 tablet (10 MG) every day except Mondays and Thursdays. On Mondays and Thursdays take 0.5 tablets (5 MG)        No Known Allergies  Family History  Problem Relation Age of Onset  . Cancer Neg Hx     BP 92/70  Pulse 65  Temp(Src) 98.3 F (36.8 C) (Oral)  Wt 366 lb (166.017 kg)  SpO2 96%   Review of Systems Denies loc and chest pain.    Objective:   Physical Exam VITAL SIGNS:  See vs page GENERAL: no distress.  Obese.  In wheelchair.   LUNGS:  Clear to auscultation Ext: trace bilat leg edema      Assessment & Plan:  CHF, much better Constipation due to narcotics, new DM, apparently overcontrolled HTN, slightly overcontrolled

## 2011-03-21 NOTE — Patient Instructions (Addendum)
Reduce lantus to 40 units daily  Stop tekturna.  i have sent a prescription to your pharmacy, for a liquid to help the bowels move.   Please come back for a follow-up appointment in 2 months.  Refer to a dietician specialist.  you will receive a phone call, about a day and time for an appointment.

## 2011-03-30 ENCOUNTER — Telehealth: Payer: Self-pay | Admitting: Cardiology

## 2011-03-30 ENCOUNTER — Ambulatory Visit: Payer: Self-pay | Admitting: Cardiology

## 2011-03-30 DIAGNOSIS — I4891 Unspecified atrial fibrillation: Secondary | ICD-10-CM

## 2011-03-30 DIAGNOSIS — I5030 Unspecified diastolic (congestive) heart failure: Secondary | ICD-10-CM

## 2011-03-30 DIAGNOSIS — I2699 Other pulmonary embolism without acute cor pulmonale: Secondary | ICD-10-CM

## 2011-03-30 DIAGNOSIS — I509 Heart failure, unspecified: Secondary | ICD-10-CM

## 2011-03-30 DIAGNOSIS — J45909 Unspecified asthma, uncomplicated: Secondary | ICD-10-CM

## 2011-03-30 NOTE — Telephone Encounter (Signed)
[facility] calling results of pt's INR 3.9 PT 46.3  10mg  on m, w, f  5mg  on tues, thu, sat, none on Sunday, if med changes please call dtr julia and fax order to [facility] @ [phone]

## 2011-03-30 NOTE — Telephone Encounter (Signed)
Started [facility] note.

## 2011-04-03 ENCOUNTER — Telehealth: Payer: Self-pay | Admitting: *Deleted

## 2011-04-03 MED ORDER — GLUCOSE BLOOD VI STRP: ORAL_STRIP | Status: AC

## 2011-04-03 NOTE — Telephone Encounter (Signed)
[facility] called-pt has misplaced glucometer and is requesting another one. Left Onetouch Meter upfront-pt informed of new meter-rx sent for test strips to pharmacy.

## 2011-04-06 ENCOUNTER — Ambulatory Visit: Payer: Self-pay | Admitting: Cardiology

## 2011-04-06 DIAGNOSIS — I2699 Other pulmonary embolism without acute cor pulmonale: Secondary | ICD-10-CM

## 2011-04-10 ENCOUNTER — Other Ambulatory Visit: Payer: Self-pay | Admitting: *Deleted

## 2011-04-10 MED ORDER — CLONIDINE HCL 0.1 MG PO TABS: 0.1000 mg | ORAL_TABLET | Freq: Two times a day (BID) | ORAL | Status: DC

## 2011-04-10 NOTE — Telephone Encounter (Signed)
R'cd fax from [facility] for refill of Clonidine.

## 2011-04-12 ENCOUNTER — Other Ambulatory Visit: Payer: Self-pay | Admitting: Endocrinology

## 2011-04-13 ENCOUNTER — Ambulatory Visit (INDEPENDENT_AMBULATORY_CARE_PROVIDER_SITE_OTHER): Payer: Self-pay | Admitting: Cardiology

## 2011-04-13 DIAGNOSIS — R0989 Other specified symptoms and signs involving the circulatory and respiratory systems: Secondary | ICD-10-CM

## 2011-04-13 DIAGNOSIS — I2699 Other pulmonary embolism without acute cor pulmonale: Secondary | ICD-10-CM

## 2011-04-18 ENCOUNTER — Other Ambulatory Visit: Payer: Self-pay

## 2011-04-18 MED ORDER — CARVEDILOL 6.25 MG PO TABS: 6.2500 mg | ORAL_TABLET | Freq: Two times a day (BID) | ORAL | Status: DC

## 2011-04-20 ENCOUNTER — Ambulatory Visit: Payer: Self-pay | Admitting: Cardiology

## 2011-04-20 DIAGNOSIS — I2699 Other pulmonary embolism without acute cor pulmonale: Secondary | ICD-10-CM

## 2011-04-20 LAB — POCT INR: INR: 2.4

## 2011-04-26 ENCOUNTER — Other Ambulatory Visit: Payer: Self-pay

## 2011-04-27 ENCOUNTER — Telehealth: Payer: Self-pay | Admitting: *Deleted

## 2011-04-27 MED ORDER — OXYCODONE HCL 15 MG PO TB12: 15.0000 mg | ORAL_TABLET | Freq: Four times a day (QID) | ORAL | Status: DC | PRN

## 2011-04-27 NOTE — Telephone Encounter (Signed)
Rx for Oxycodone was written for #60 with 0 refills, but signature is for 1 tablet qid. Pt needs amount changed to #120.

## 2011-04-27 NOTE — Telephone Encounter (Signed)
Pt informed, Rx in cabinet for pt pick up  

## 2011-04-27 NOTE — Telephone Encounter (Signed)
Left msg on triage requesting speak with nurse. Rx that was pick up pharmacist states it was wrong... 04/27/11@4 :49pm/LMB

## 2011-04-28 NOTE — Telephone Encounter (Signed)
i'll do when the rx for # 60 is returned

## 2011-04-28 NOTE — Telephone Encounter (Signed)
Pt's daughter informed to return rx for Oxycodone #60.

## 2011-05-01 ENCOUNTER — Telehealth: Payer: Self-pay | Admitting: Endocrinology

## 2011-05-01 MED ORDER — OXYCODONE HCL 15 MG PO TABS: 15.0000 mg | ORAL_TABLET | Freq: Four times a day (QID) | ORAL | Status: DC | PRN

## 2011-05-01 NOTE — Telephone Encounter (Signed)
i printed 

## 2011-05-01 NOTE — Telephone Encounter (Signed)
Rx placed upfront in cabinet, left message on daughter's VM that rx is ready for pickup.

## 2011-05-03 ENCOUNTER — Ambulatory Visit: Payer: Self-pay | Admitting: Internal Medicine

## 2011-05-03 DIAGNOSIS — I2699 Other pulmonary embolism without acute cor pulmonale: Secondary | ICD-10-CM

## 2011-05-03 LAB — POCT INR: INR: 2

## 2011-05-11 ENCOUNTER — Other Ambulatory Visit: Payer: Self-pay | Admitting: Endocrinology

## 2011-05-17 ENCOUNTER — Telehealth: Payer: Self-pay | Admitting: Cardiology

## 2011-05-17 ENCOUNTER — Ambulatory Visit: Payer: Self-pay | Admitting: Internal Medicine

## 2011-05-17 DIAGNOSIS — I2699 Other pulmonary embolism without acute cor pulmonale: Secondary | ICD-10-CM

## 2011-05-23 ENCOUNTER — Other Ambulatory Visit: Payer: Self-pay | Admitting: Endocrinology

## 2011-06-08 ENCOUNTER — Other Ambulatory Visit: Payer: Self-pay | Admitting: Endocrinology

## 2011-06-16 ENCOUNTER — Other Ambulatory Visit: Payer: Self-pay

## 2011-06-16 MED ORDER — OXYCODONE HCL 15 MG PO TABS: 15.0000 mg | ORAL_TABLET | Freq: Four times a day (QID) | ORAL | Status: DC | PRN

## 2011-06-19 NOTE — Telephone Encounter (Signed)
Pt informed, Rx in cabinet for pt pick up  

## 2011-07-06 ENCOUNTER — Other Ambulatory Visit: Payer: Self-pay | Admitting: Endocrinology

## 2011-07-18 ENCOUNTER — Telehealth: Payer: Self-pay | Admitting: *Deleted

## 2011-07-18 MED ORDER — OXYCODONE HCL 15 MG PO TABS: 15.0000 mg | ORAL_TABLET | Freq: Four times a day (QID) | ORAL | Status: AC | PRN

## 2011-07-18 NOTE — Telephone Encounter (Signed)
i printed Ov is due 

## 2011-07-18 NOTE — Telephone Encounter (Signed)
Left msg on triage needing renewal on his pain med... 07/18/11@2 :20pm/LMB

## 2011-07-18 NOTE — Telephone Encounter (Signed)
Notified pt with md response. Put rx up  Front for pick-up..07/18/11@4 :57pm/LMB

## 2011-07-27 ENCOUNTER — Other Ambulatory Visit: Payer: Self-pay | Admitting: Endocrinology

## 2011-08-17 ENCOUNTER — Other Ambulatory Visit: Payer: Self-pay | Admitting: Endocrinology

## 2011-08-17 NOTE — Telephone Encounter (Signed)
Last written 02/01/2011 #30 with 5 refills-please advise.

## 2011-08-18 MED ORDER — ALPRAZOLAM ER 3 MG PO TB24: ORAL_TABLET | ORAL | Status: DC

## 2011-08-18 NOTE — Telephone Encounter (Signed)
i printed Ov due

## 2011-08-18 NOTE — Telephone Encounter (Signed)
Rx faxed to [facility].  

## 2011-08-22 ENCOUNTER — Ambulatory Visit: Payer: Medicare Other | Admitting: Endocrinology

## 2011-08-24 ENCOUNTER — Other Ambulatory Visit: Payer: Self-pay | Admitting: Endocrinology

## 2011-09-04 ENCOUNTER — Telehealth: Payer: Self-pay

## 2011-09-04 NOTE — Telephone Encounter (Signed)
Called spoke with pt's wife advised pt is overdue for Coumadin follow-up.  Last Coumadin check by [facility] 05/17/11 was due for f/u in office in 2 weeks after last check.  Advised pt's wife to have pt call clinic to schedule f/u appt as it is very important to have this monitored to prevent complications of clot or bleeding.  Pt's wife verbalizes understanding and states she will have pt call back for appt.

## 2011-09-14 ENCOUNTER — Other Ambulatory Visit: Payer: Self-pay | Admitting: Endocrinology

## 2011-09-15 NOTE — Telephone Encounter (Signed)
Rx faxed to [facility].  

## 2011-09-15 NOTE — Telephone Encounter (Signed)
Last written 08/18/2011 #30 with 0 refills-please advise.

## 2011-09-18 ENCOUNTER — Other Ambulatory Visit: Payer: Self-pay | Admitting: Endocrinology

## 2011-09-19 ENCOUNTER — Other Ambulatory Visit: Payer: Self-pay | Admitting: Endocrinology

## 2011-09-19 MED ORDER — ALPRAZOLAM 2 MG PO TABS: 2.0000 mg | ORAL_TABLET | Freq: Every evening | ORAL | Status: AC | PRN

## 2011-09-20 ENCOUNTER — Telehealth: Payer: Self-pay | Admitting: *Deleted

## 2011-09-20 ENCOUNTER — Encounter: Payer: Self-pay | Admitting: General Practice

## 2011-09-20 NOTE — Telephone Encounter (Signed)
Pt informed

## 2011-09-20 NOTE — Telephone Encounter (Signed)
R'cd PA from [facility] for Alprazolam 3mg  ER-per SAE, he changed Alprazolam ER to Alprazolam 2mg  qhs prn. Called pt to inform, left message for pt to callback office.

## 2011-09-21 ENCOUNTER — Encounter: Payer: Self-pay | Admitting: Endocrinology

## 2011-09-21 ENCOUNTER — Other Ambulatory Visit (INDEPENDENT_AMBULATORY_CARE_PROVIDER_SITE_OTHER): Payer: Medicare Other

## 2011-09-21 ENCOUNTER — Ambulatory Visit (INDEPENDENT_AMBULATORY_CARE_PROVIDER_SITE_OTHER): Payer: Medicare Other | Admitting: Endocrinology

## 2011-09-21 ENCOUNTER — Ambulatory Visit (HOSPITAL_COMMUNITY)
Admission: RE | Admit: 2011-09-21 | Discharge: 2011-09-21 | Disposition: A | Discharge status: Home / Self Care | Payer: Medicare Other | Source: Ambulatory Visit | Attending: Endocrinology | Admitting: Endocrinology

## 2011-09-21 VITALS — BP 132/84 | HR 66 | Temp 97.5°F | Ht 73.0 in | Wt 353.0 lb

## 2011-09-21 DIAGNOSIS — E1029 Type 1 diabetes mellitus with other diabetic kidney complication: Secondary | ICD-10-CM

## 2011-09-21 DIAGNOSIS — N058 Unspecified nephritic syndrome with other morphologic changes: Secondary | ICD-10-CM

## 2011-09-21 DIAGNOSIS — M545 Low back pain, unspecified: Secondary | ICD-10-CM

## 2011-09-21 DIAGNOSIS — Z79899 Other long term (current) drug therapy: Secondary | ICD-10-CM

## 2011-09-21 DIAGNOSIS — M47817 Spondylosis without myelopathy or radiculopathy, lumbosacral region: Secondary | ICD-10-CM | POA: Insufficient documentation

## 2011-09-21 DIAGNOSIS — Z23 Encounter for immunization: Secondary | ICD-10-CM

## 2011-09-21 LAB — URINALYSIS, ROUTINE W REFLEX MICROSCOPIC
Hgb urine dipstick: NEGATIVE
Leukocytes, UA: NEGATIVE
Nitrite: NEGATIVE
Urobilinogen, UA: 0.2 (ref 0.0–1.0)

## 2011-09-21 LAB — HEMOGLOBIN A1C: Hgb A1c MFr Bld: 5.8 % (ref 4.6–6.5)

## 2011-09-21 MED ORDER — POTASSIUM CHLORIDE CRYS ER 20 MEQ PO TBCR: 40.0000 meq | EXTENDED_RELEASE_TABLET | Freq: Every day | ORAL | Status: DC

## 2011-09-21 MED ORDER — OXYCODONE HCL 15 MG PO TABS: 15.0000 mg | ORAL_TABLET | ORAL | Status: DC | PRN

## 2011-09-21 NOTE — Patient Instructions (Addendum)
Please come back for a regular physical appointment in 3 months. blood tests, a urine test, and x-rays are being requested for you today.  You will receive a letter with results. Here is a refill of your pain medication.

## 2011-09-21 NOTE — Progress Notes (Signed)
Subjective:    Patient ID: Travis Chapman, male    DOB: Aug 05, 1945, 66 y.o.   MRN: [id number]  HPI Pt returns for f/u of insulin-requiring DM (dx'ed 1992; complicated by nephropathy). no cbg record, but states cbg's are well-controlled. Pt states 3 weeks of moderate pain at the left lower back, with assoc pain at the left flank.   Past Medical History  Diagnosis Date  . ANXIETY 07/23/2006  . ASTHMA 07/23/2006  . DM 05/23/2007  . HYPERTENSION 07/23/2006  . OSTEOARTHRITIS 07/23/2006  . HYPERCHOLESTEROLEMIA 05/23/2007  . ANEMIA 08/31/2008  . Palpitations 02/10/2008  . ALCOHOL ABUSE, IN REMISSION, HX OF 08/26/2008  . TOBACCO ABUSE, HX OF 08/26/2008  . Pulmonary embolism 03/16/2010  . ED (erectile dysfunction)   . Morbid obesity   . DM retinopathy   . 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 DM neuropathy with neurologic complication   . CHF (congestive heart failure)     With a preserved EF  . 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 Sleep apnea   . Shortness of breath   . CARCINOMA, PROSTATE 09/29/2009    Past Surgical History  Procedure Date  . Back surgery   . Tracheostomy     History   Social History  . Marital Status: Divorced    Spouse Name: N/A    Number of Children: N/A  . Years of Education: N/A   Occupational History  . Disabled    Social History Main Topics  . Smoking status: Former Smoker    Quit date: 01/16/2005  . Smokeless tobacco: Not on file  . Alcohol Use: No  . Drug Use: Not on file  . Sexually Active: Not on file   Other Topics Concern  . Not on file   Social History Narrative   SingleLives with his elderly mother    Current Outpatient Prescriptions on File Prior to Visit  Medication Sig Dispense Refill  . alprazolam (XANAX) 2 MG tablet Take 1 tablet (2 mg total) by mouth at bedtime as needed for sleep.  30 tablet  0  . BENICAR 40 MG tablet TAKE ONE TABLET BY MOUTH EVERY DAY   90 each  1  . carvedilol (COREG) 6.25 MG tablet Take 1 tablet (6.25 mg total) by mouth 2 (two) times daily with a meal.  60 tablet  5  . citalopram (CELEXA) 40 MG tablet TAKE ONE TABLET BY MOUTH EVERY DAY  30 tablet  5  . cloNIDine (CATAPRES) 0.1 MG tablet TAKE ONE TABLET BY MOUTH TWICE DAILY  60 tablet  4  . furosemide (LASIX) 40 MG tablet Take 40 mg by mouth 2 (two) times daily.      Marland Kitchen glucose blood (ONE TOUCH ULTRA TEST) test strip Use as instructed twice daily  100 each  3  . hydrochlorothiazide (HYDRODIURIL) 25 MG tablet Take 25 mg by mouth daily.      . insulin glargine (LANTUS) 100 UNIT/ML injection Inject 25 Units into the skin every morning.       . Lactulose 20 GM/30ML SOLN Take 30 mLs (20 g total) by mouth 3 (three) times daily.  960 mL  11  . metoCLOPramide (REGLAN) 5 MG tablet TAKE ONE TABLET BY MOUTH THREE TIMES DAILY  90 tablet  5  . Multiple Vitamin (MULTIVITAMIN) tablet Take 1 tablet by mouth daily.        Marland Kitchen  potassium chloride SA (K-DUR,KLOR-CON) 20 MEQ tablet Take 2 tablets (40 mEq total) by mouth daily.  60 tablet  5  . RELION INSULIN SYR 1CC/30G 30G X 5/16" 1 ML MISC USE AS DIRECTED  100 each  3  . simvastatin (ZOCOR) 20 MG tablet TAKE ONE TABLET BY MOUTH AT BEDTIME  30 tablet  5  . warfarin (COUMADIN) 10 MG tablet Take as directed by Coumadin Clinic  40 tablet  3    No Known Allergies  Family History  Problem Relation Age of Onset  . Cancer Neg Hx     BP 132/84  Pulse 66  Temp 97.5 F (36.4 C) (Oral)  Ht 6\' 1"  (1.854 m)  Wt 353 lb (160.12 kg)  BMI 46.57 kg/m2  SpO2 95%  Review of Systems denies hypoglycemia and dysuria.      Objective:   Physical Exam VITAL SIGNS:  See vs page GENERAL: no distress Spine: nontender.   Neuro: sensation is intact to touch on the LE's.   Feet: sees podiatry Lab Results  Component Value Date   HGBA1C 5.8 09/21/2011   (i reviewed x-ray results)    Assessment & Plan:  DM: overcontrolled Low-back pain, new, prob due to  OA

## 2011-09-22 ENCOUNTER — Telehealth: Payer: Self-pay | Admitting: *Deleted

## 2011-09-22 NOTE — Telephone Encounter (Signed)
Pt informed of results.

## 2011-09-22 NOTE — Telephone Encounter (Signed)
Called pt to inform of lab results, left message for pt to callback office (letter also mailed to pt). 

## 2011-09-29 ENCOUNTER — Encounter (HOSPITAL_COMMUNITY): Payer: Self-pay | Admitting: *Deleted

## 2011-09-29 DIAGNOSIS — G4733 Obstructive sleep apnea (adult) (pediatric): Secondary | ICD-10-CM | POA: Diagnosis present

## 2011-09-29 DIAGNOSIS — J9610 Chronic respiratory failure, unspecified whether with hypoxia or hypercapnia: Secondary | ICD-10-CM | POA: Diagnosis present

## 2011-09-29 DIAGNOSIS — I2782 Chronic pulmonary embolism: Secondary | ICD-10-CM | POA: Diagnosis present

## 2011-09-29 DIAGNOSIS — Z9911 Dependence on respirator [ventilator] status: Secondary | ICD-10-CM

## 2011-09-29 DIAGNOSIS — E11319 Type 2 diabetes mellitus with unspecified diabetic retinopathy without macular edema: Secondary | ICD-10-CM | POA: Diagnosis present

## 2011-09-29 DIAGNOSIS — E7800 Pure hypercholesterolemia, unspecified: Secondary | ICD-10-CM | POA: Diagnosis present

## 2011-09-29 DIAGNOSIS — H5440 Blindness, one eye, unspecified eye: Secondary | ICD-10-CM | POA: Diagnosis present

## 2011-09-29 DIAGNOSIS — E1142 Type 2 diabetes mellitus with diabetic polyneuropathy: Secondary | ICD-10-CM | POA: Diagnosis present

## 2011-09-29 DIAGNOSIS — Z87891 Personal history of nicotine dependence: Secondary | ICD-10-CM

## 2011-09-29 DIAGNOSIS — Z7901 Long term (current) use of anticoagulants: Secondary | ICD-10-CM

## 2011-09-29 DIAGNOSIS — Z6841 Body Mass Index (BMI) 40.0 and over, adult: Secondary | ICD-10-CM

## 2011-09-29 DIAGNOSIS — R5381 Other malaise: Secondary | ICD-10-CM | POA: Diagnosis present

## 2011-09-29 DIAGNOSIS — N179 Acute kidney failure, unspecified: Secondary | ICD-10-CM | POA: Diagnosis present

## 2011-09-29 DIAGNOSIS — E1139 Type 2 diabetes mellitus with other diabetic ophthalmic complication: Secondary | ICD-10-CM | POA: Diagnosis present

## 2011-09-29 DIAGNOSIS — E1149 Type 2 diabetes mellitus with other diabetic neurological complication: Secondary | ICD-10-CM | POA: Diagnosis present

## 2011-09-29 DIAGNOSIS — Z79899 Other long term (current) drug therapy: Secondary | ICD-10-CM

## 2011-09-29 DIAGNOSIS — I509 Heart failure, unspecified: Secondary | ICD-10-CM | POA: Diagnosis present

## 2011-09-29 DIAGNOSIS — E662 Morbid (severe) obesity with alveolar hypoventilation: Secondary | ICD-10-CM | POA: Diagnosis present

## 2011-09-29 DIAGNOSIS — M48061 Spinal stenosis, lumbar region without neurogenic claudication: Principal | ICD-10-CM | POA: Diagnosis present

## 2011-09-29 DIAGNOSIS — I129 Hypertensive chronic kidney disease with stage 1 through stage 4 chronic kidney disease, or unspecified chronic kidney disease: Secondary | ICD-10-CM | POA: Diagnosis present

## 2011-09-29 DIAGNOSIS — F1011 Alcohol abuse, in remission: Secondary | ICD-10-CM | POA: Diagnosis present

## 2011-09-29 DIAGNOSIS — J4489 Other specified chronic obstructive pulmonary disease: Secondary | ICD-10-CM | POA: Diagnosis present

## 2011-09-29 DIAGNOSIS — I5032 Chronic diastolic (congestive) heart failure: Secondary | ICD-10-CM | POA: Diagnosis present

## 2011-09-29 DIAGNOSIS — Z8546 Personal history of malignant neoplasm of prostate: Secondary | ICD-10-CM

## 2011-09-29 DIAGNOSIS — Z794 Long term (current) use of insulin: Secondary | ICD-10-CM

## 2011-09-29 DIAGNOSIS — N189 Chronic kidney disease, unspecified: Secondary | ICD-10-CM | POA: Diagnosis present

## 2011-09-29 DIAGNOSIS — Z930 Tracheostomy status: Secondary | ICD-10-CM

## 2011-09-29 DIAGNOSIS — J449 Chronic obstructive pulmonary disease, unspecified: Secondary | ICD-10-CM | POA: Diagnosis present

## 2011-09-29 DIAGNOSIS — F411 Generalized anxiety disorder: Secondary | ICD-10-CM | POA: Diagnosis present

## 2011-09-29 DIAGNOSIS — M1710 Unilateral primary osteoarthritis, unspecified knee: Secondary | ICD-10-CM | POA: Diagnosis present

## 2011-09-29 NOTE — ED Notes (Signed)
Per EMS: pt uses walker at home, when leaving bathroom pt fell forward.  No LOC, no dizziness or vomiting.  States legs are weak and couldn't get up, problem is ongoing but worse tonight.  BP 140/80. P 80, RR 18-trach on now O2.

## 2011-09-30 ENCOUNTER — Inpatient Hospital Stay (HOSPITAL_COMMUNITY)
Admission: EM | Admit: 2011-09-30 | Discharge: 2011-10-05 | DRG: 552 | Disposition: A | Discharge status: Home Health | Payer: Medicare Other | Attending: Internal Medicine | Admitting: Internal Medicine

## 2011-09-30 ENCOUNTER — Other Ambulatory Visit (HOSPITAL_COMMUNITY): Payer: Medicare Other

## 2011-09-30 ENCOUNTER — Inpatient Hospital Stay (HOSPITAL_COMMUNITY): Payer: Medicare Other

## 2011-09-30 ENCOUNTER — Encounter (HOSPITAL_COMMUNITY): Payer: Self-pay

## 2011-09-30 ENCOUNTER — Emergency Department (HOSPITAL_COMMUNITY): Payer: Medicare Other

## 2011-09-30 DIAGNOSIS — R29898 Other symptoms and signs involving the musculoskeletal system: Secondary | ICD-10-CM

## 2011-09-30 DIAGNOSIS — R809 Proteinuria, unspecified: Secondary | ICD-10-CM

## 2011-09-30 DIAGNOSIS — I5030 Unspecified diastolic (congestive) heart failure: Secondary | ICD-10-CM

## 2011-09-30 DIAGNOSIS — I493 Ventricular premature depolarization: Secondary | ICD-10-CM

## 2011-09-30 DIAGNOSIS — Z87891 Personal history of nicotine dependence: Secondary | ICD-10-CM

## 2011-09-30 DIAGNOSIS — M25569 Pain in unspecified knee: Secondary | ICD-10-CM

## 2011-09-30 DIAGNOSIS — R0989 Other specified symptoms and signs involving the circulatory and respiratory systems: Secondary | ICD-10-CM

## 2011-09-30 DIAGNOSIS — I5032 Chronic diastolic (congestive) heart failure: Secondary | ICD-10-CM | POA: Diagnosis present

## 2011-09-30 DIAGNOSIS — R9431 Abnormal electrocardiogram [ECG] [EKG]: Secondary | ICD-10-CM

## 2011-09-30 DIAGNOSIS — G4733 Obstructive sleep apnea (adult) (pediatric): Secondary | ICD-10-CM | POA: Diagnosis present

## 2011-09-30 DIAGNOSIS — R002 Palpitations: Secondary | ICD-10-CM

## 2011-09-30 DIAGNOSIS — D649 Anemia, unspecified: Secondary | ICD-10-CM

## 2011-09-30 DIAGNOSIS — E1029 Type 1 diabetes mellitus with other diabetic kidney complication: Secondary | ICD-10-CM

## 2011-09-30 DIAGNOSIS — F1021 Alcohol dependence, in remission: Secondary | ICD-10-CM

## 2011-09-30 DIAGNOSIS — I4729 Other ventricular tachycardia: Secondary | ICD-10-CM

## 2011-09-30 DIAGNOSIS — M545 Low back pain, unspecified: Secondary | ICD-10-CM

## 2011-09-30 DIAGNOSIS — N289 Disorder of kidney and ureter, unspecified: Secondary | ICD-10-CM

## 2011-09-30 DIAGNOSIS — J45909 Unspecified asthma, uncomplicated: Secondary | ICD-10-CM

## 2011-09-30 DIAGNOSIS — R0609 Other forms of dyspnea: Secondary | ICD-10-CM | POA: Diagnosis present

## 2011-09-30 DIAGNOSIS — J9690 Respiratory failure, unspecified, unspecified whether with hypoxia or hypercapnia: Secondary | ICD-10-CM

## 2011-09-30 DIAGNOSIS — I2699 Other pulmonary embolism without acute cor pulmonale: Secondary | ICD-10-CM | POA: Diagnosis present

## 2011-09-30 DIAGNOSIS — R635 Abnormal weight gain: Secondary | ICD-10-CM

## 2011-09-30 DIAGNOSIS — I472 Ventricular tachycardia: Secondary | ICD-10-CM

## 2011-09-30 DIAGNOSIS — M1990 Unspecified osteoarthritis, unspecified site: Secondary | ICD-10-CM

## 2011-09-30 DIAGNOSIS — Z930 Tracheostomy status: Secondary | ICD-10-CM

## 2011-09-30 DIAGNOSIS — R531 Weakness: Secondary | ICD-10-CM

## 2011-09-30 DIAGNOSIS — I428 Other cardiomyopathies: Secondary | ICD-10-CM

## 2011-09-30 DIAGNOSIS — M25519 Pain in unspecified shoulder: Secondary | ICD-10-CM

## 2011-09-30 DIAGNOSIS — C61 Malignant neoplasm of prostate: Secondary | ICD-10-CM

## 2011-09-30 DIAGNOSIS — I5033 Acute on chronic diastolic (congestive) heart failure: Secondary | ICD-10-CM

## 2011-09-30 DIAGNOSIS — E7800 Pure hypercholesterolemia, unspecified: Secondary | ICD-10-CM

## 2011-09-30 DIAGNOSIS — I10 Essential (primary) hypertension: Secondary | ICD-10-CM | POA: Diagnosis present

## 2011-09-30 DIAGNOSIS — F411 Generalized anxiety disorder: Secondary | ICD-10-CM

## 2011-09-30 DIAGNOSIS — J9610 Chronic respiratory failure, unspecified whether with hypoxia or hypercapnia: Secondary | ICD-10-CM

## 2011-09-30 DIAGNOSIS — I509 Heart failure, unspecified: Secondary | ICD-10-CM

## 2011-09-30 LAB — COMPREHENSIVE METABOLIC PANEL
BUN: 31 mg/dL — ABNORMAL HIGH (ref 6–23)
CO2: 28 mEq/L (ref 19–32)
Calcium: 9.8 mg/dL (ref 8.4–10.5)
Creatinine, Ser: 1.79 mg/dL — ABNORMAL HIGH (ref 0.50–1.35)
GFR calc Af Amer: 44 mL/min — ABNORMAL LOW (ref >90)
GFR calc non Af Amer: 38 mL/min — ABNORMAL LOW (ref >90)
Glucose, Bld: 124 mg/dL — ABNORMAL HIGH (ref 70–99)

## 2011-09-30 LAB — GLUCOSE, CAPILLARY
Glucose-Capillary: 144 mg/dL — ABNORMAL HIGH (ref 70–99)
Glucose-Capillary: 178 mg/dL — ABNORMAL HIGH (ref 70–99)
Glucose-Capillary: 170 mg/dL — ABNORMAL HIGH (ref 70–99)

## 2011-09-30 LAB — URINALYSIS, ROUTINE W REFLEX MICROSCOPIC
Nitrite: NEGATIVE
Specific Gravity, Urine: 1.02 (ref 1.005–1.030)
Urobilinogen, UA: 1 mg/dL (ref 0.0–1.0)

## 2011-09-30 LAB — POCT I-STAT, CHEM 8
BUN: 34 mg/dL — ABNORMAL HIGH (ref 6–23)
Calcium, Ion: 1.21 mmol/L (ref 1.13–1.30)
Glucose, Bld: 124 mg/dL — ABNORMAL HIGH (ref 70–99)
HCT: 42 % (ref 39.0–52.0)
TCO2: 28 mmol/L (ref 0–100)

## 2011-09-30 LAB — CBC WITH DIFFERENTIAL/PLATELET
Basophils Absolute: 0 10*3/uL (ref 0.0–0.1)
Eosinophils Relative: 1 % (ref 0–5)
HCT: 39.8 % (ref 39.0–52.0)
Lymphocytes Relative: 17 % (ref 12–46)
Lymphs Abs: 1.7 10*3/uL (ref 0.7–4.0)
MCV: 89.8 fL (ref 78.0–100.0)
Monocytes Absolute: 0.9 10*3/uL (ref 0.1–1.0)
RDW: 13.6 % (ref 11.5–15.5)
WBC: 9.8 10*3/uL (ref 4.0–10.5)

## 2011-09-30 LAB — CBC
Hemoglobin: 12.5 g/dL — ABNORMAL LOW (ref 13.0–17.0)
MCHC: 32.5 g/dL (ref 30.0–36.0)

## 2011-09-30 LAB — PROTIME-INR
INR: 1.81 — ABNORMAL HIGH (ref 0.00–1.49)
Prothrombin Time: 21.3 seconds — ABNORMAL HIGH (ref 11.6–15.2)

## 2011-09-30 LAB — CREATININE, SERUM
Creatinine, Ser: 1.55 mg/dL — ABNORMAL HIGH (ref 0.50–1.35)
GFR calc non Af Amer: 45 mL/min — ABNORMAL LOW (ref >90)

## 2011-09-30 LAB — URINE MICROSCOPIC-ADD ON

## 2011-09-30 LAB — MRSA PCR SCREENING: MRSA by PCR: POSITIVE — AB

## 2011-09-30 LAB — POCT I-STAT TROPONIN I: Troponin i, poc: 0.05 ng/mL (ref 0.00–0.08)

## 2011-09-30 MED ORDER — WARFARIN - PHARMACIST DOSING INPATIENT
Freq: Every day | Status: DC
  Administered 2011-10-02: 19:00:00

## 2011-09-30 MED ORDER — POTASSIUM CHLORIDE CRYS ER 20 MEQ PO TBCR
40.0000 meq | EXTENDED_RELEASE_TABLET | Freq: Every day | ORAL | Status: DC
  Administered 2011-09-30 – 2011-10-05 (×6): 40 meq via ORAL
  Filled 2011-09-30 (×5): qty 2

## 2011-09-30 MED ORDER — ALPRAZOLAM ER 1 MG PO TB24: 3.0000 mg | ORAL_TABLET | Freq: Every morning | ORAL | Status: DC

## 2011-09-30 MED ORDER — CARVEDILOL 6.25 MG PO TABS
6.2500 mg | ORAL_TABLET | Freq: Two times a day (BID) | ORAL | Status: DC
  Administered 2011-09-30 – 2011-10-05 (×10): 6.25 mg via ORAL
  Filled 2011-09-30 (×12): qty 1

## 2011-09-30 MED ORDER — ACETAMINOPHEN 325 MG PO TABS
650.0000 mg | ORAL_TABLET | Freq: Four times a day (QID) | ORAL | Status: DC | PRN
  Administered 2011-09-30: 650 mg via ORAL
  Filled 2011-09-30: qty 2

## 2011-09-30 MED ORDER — ALPRAZOLAM 0.25 MG PO TABS
2.0000 mg | ORAL_TABLET | Freq: Every evening | ORAL | Status: DC | PRN
  Administered 2011-10-01 – 2011-10-05 (×4): 2 mg via ORAL
  Filled 2011-09-30 (×4): qty 8

## 2011-09-30 MED ORDER — SODIUM CHLORIDE 0.9 % IV SOLN: 250.0000 mL | INTRAVENOUS | Status: DC | PRN

## 2011-09-30 MED ORDER — ACETAMINOPHEN 650 MG RE SUPP: 650.0000 mg | Freq: Four times a day (QID) | RECTAL | Status: DC | PRN

## 2011-09-30 MED ORDER — BIOTENE DRY MOUTH MT LIQD
15.0000 mL | Freq: Four times a day (QID) | OROMUCOSAL | Status: DC
  Administered 2011-10-01 – 2011-10-05 (×14): 15 mL via OROMUCOSAL

## 2011-09-30 MED ORDER — PANTOPRAZOLE SODIUM 40 MG PO TBEC
40.0000 mg | DELAYED_RELEASE_TABLET | Freq: Every day | ORAL | Status: DC
  Administered 2011-09-30 – 2011-10-05 (×6): 40 mg via ORAL
  Filled 2011-09-30 (×6): qty 1

## 2011-09-30 MED ORDER — INSULIN GLARGINE 100 UNIT/ML ~~LOC~~ SOLN
25.0000 [IU] | Freq: Every morning | SUBCUTANEOUS | Status: DC
  Administered 2011-09-30 – 2011-10-05 (×6): 25 [IU] via SUBCUTANEOUS

## 2011-09-30 MED ORDER — AMLODIPINE BESYLATE 10 MG PO TABS
10.0000 mg | ORAL_TABLET | Freq: Every day | ORAL | Status: DC
  Administered 2011-09-30 – 2011-10-05 (×6): 10 mg via ORAL
  Filled 2011-09-30 (×6): qty 1

## 2011-09-30 MED ORDER — SIMVASTATIN 20 MG PO TABS
20.0000 mg | ORAL_TABLET | Freq: Every day | ORAL | Status: DC
  Administered 2011-09-30 – 2011-10-04 (×5): 20 mg via ORAL
  Filled 2011-09-30 (×6): qty 1

## 2011-09-30 MED ORDER — ALUM & MAG HYDROXIDE-SIMETH 200-200-20 MG/5ML PO SUSP
30.0000 mL | Freq: Four times a day (QID) | ORAL | Status: DC | PRN
  Filled 2011-09-30: qty 30

## 2011-09-30 MED ORDER — CHLORHEXIDINE GLUCONATE CLOTH 2 % EX PADS
6.0000 | MEDICATED_PAD | Freq: Every day | CUTANEOUS | Status: DC
  Administered 2011-10-01 – 2011-10-04 (×4): 6 via TOPICAL

## 2011-09-30 MED ORDER — WARFARIN SODIUM 7.5 MG PO TABS
7.5000 mg | ORAL_TABLET | Freq: Once | ORAL | Status: AC
  Administered 2011-09-30: 7.5 mg via ORAL
  Filled 2011-09-30: qty 1

## 2011-09-30 MED ORDER — METOCLOPRAMIDE HCL 5 MG PO TABS
5.0000 mg | ORAL_TABLET | Freq: Three times a day (TID) | ORAL | Status: DC
  Administered 2011-09-30 – 2011-10-05 (×16): 5 mg via ORAL
  Filled 2011-09-30 (×18): qty 1

## 2011-09-30 MED ORDER — ONDANSETRON HCL 4 MG PO TABS: 4.0000 mg | ORAL_TABLET | Freq: Four times a day (QID) | ORAL | Status: DC | PRN

## 2011-09-30 MED ORDER — WARFARIN SODIUM 5 MG PO TABS
5.0000 mg | ORAL_TABLET | ORAL | Status: DC
  Filled 2011-09-30: qty 1

## 2011-09-30 MED ORDER — ALBUTEROL SULFATE (5 MG/ML) 0.5% IN NEBU
2.5000 mg | INHALATION_SOLUTION | RESPIRATORY_TRACT | Status: DC | PRN
  Filled 2011-09-30 (×2): qty 0.5

## 2011-09-30 MED ORDER — BISACODYL 10 MG RE SUPP: 10.0000 mg | Freq: Every day | RECTAL | Status: DC | PRN

## 2011-09-30 MED ORDER — WARFARIN SODIUM 10 MG PO TABS: 10.0000 mg | ORAL_TABLET | ORAL | Status: DC

## 2011-09-30 MED ORDER — MUPIROCIN 2 % EX OINT
1.0000 "application " | TOPICAL_OINTMENT | Freq: Two times a day (BID) | CUTANEOUS | Status: AC
  Administered 2011-09-30 – 2011-10-05 (×10): 1 via NASAL
  Filled 2011-09-30: qty 22

## 2011-09-30 MED ORDER — CLONIDINE HCL 0.1 MG PO TABS
0.1000 mg | ORAL_TABLET | Freq: Two times a day (BID) | ORAL | Status: DC
  Administered 2011-09-30 – 2011-10-05 (×11): 0.1 mg via ORAL
  Filled 2011-09-30 (×12): qty 1

## 2011-09-30 MED ORDER — ALBUTEROL SULFATE (5 MG/ML) 0.5% IN NEBU
2.5000 mg | INHALATION_SOLUTION | Freq: Four times a day (QID) | RESPIRATORY_TRACT | Status: DC
  Administered 2011-09-30 – 2011-10-05 (×20): 2.5 mg via RESPIRATORY_TRACT
  Filled 2011-09-30 (×18): qty 0.5

## 2011-09-30 MED ORDER — SODIUM CHLORIDE 0.9 % IJ SOLN
3.0000 mL | Freq: Two times a day (BID) | INTRAMUSCULAR | Status: DC
  Administered 2011-09-30 – 2011-10-04 (×9): 3 mL via INTRAVENOUS

## 2011-09-30 MED ORDER — FUROSEMIDE 40 MG PO TABS
40.0000 mg | ORAL_TABLET | Freq: Every day | ORAL | Status: DC
  Administered 2011-09-30 – 2011-10-05 (×6): 40 mg via ORAL
  Filled 2011-09-30 (×6): qty 1

## 2011-09-30 MED ORDER — ONE-DAILY MULTI VITAMINS PO TABS: 1.0000 | ORAL_TABLET | Freq: Every day | ORAL | Status: DC

## 2011-09-30 MED ORDER — HEPARIN SODIUM (PORCINE) 5000 UNIT/ML IJ SOLN
5000.0000 [IU] | Freq: Three times a day (TID) | INTRAMUSCULAR | Status: DC
  Administered 2011-09-30 – 2011-10-01 (×3): 5000 [IU] via SUBCUTANEOUS
  Filled 2011-09-30 (×6): qty 1

## 2011-09-30 MED ORDER — INSULIN ASPART 100 UNIT/ML ~~LOC~~ SOLN
0.0000 [IU] | Freq: Three times a day (TID) | SUBCUTANEOUS | Status: DC
  Administered 2011-09-30 – 2011-10-01 (×3): 3 [IU] via SUBCUTANEOUS
  Administered 2011-10-02: 2 [IU] via SUBCUTANEOUS
  Administered 2011-10-02: 5 [IU] via SUBCUTANEOUS
  Administered 2011-10-02 – 2011-10-03 (×3): 3 [IU] via SUBCUTANEOUS
  Administered 2011-10-03 – 2011-10-04 (×2): 2 [IU] via SUBCUTANEOUS
  Administered 2011-10-04 – 2011-10-05 (×2): 3 [IU] via SUBCUTANEOUS
  Administered 2011-10-05: 2 [IU] via SUBCUTANEOUS

## 2011-09-30 MED ORDER — FLEET ENEMA 7-19 GM/118ML RE ENEM
1.0000 | ENEMA | Freq: Once | RECTAL | Status: AC | PRN
  Filled 2011-09-30: qty 1

## 2011-09-30 MED ORDER — IPRATROPIUM BROMIDE 0.02 % IN SOLN
0.5000 mg | Freq: Four times a day (QID) | RESPIRATORY_TRACT | Status: DC
  Administered 2011-09-30 – 2011-10-05 (×20): 0.5 mg via RESPIRATORY_TRACT
  Filled 2011-09-30 (×20): qty 2.5

## 2011-09-30 MED ORDER — WARFARIN SODIUM 5 MG PO TABS: 5.0000 mg | ORAL_TABLET | Freq: Every day | ORAL | Status: DC

## 2011-09-30 MED ORDER — CHLORHEXIDINE GLUCONATE 0.12 % MT SOLN
15.0000 mL | Freq: Two times a day (BID) | OROMUCOSAL | Status: DC
  Administered 2011-09-30 – 2011-10-05 (×10): 15 mL via OROMUCOSAL
  Filled 2011-09-30 (×12): qty 15

## 2011-09-30 MED ORDER — ADULT MULTIVITAMIN W/MINERALS CH
1.0000 | ORAL_TABLET | Freq: Every day | ORAL | Status: DC
  Administered 2011-09-30 – 2011-10-05 (×6): 1 via ORAL
  Filled 2011-09-30 (×6): qty 1

## 2011-09-30 MED ORDER — ALPRAZOLAM 0.25 MG PO TABS
0.2500 mg | ORAL_TABLET | Freq: Three times a day (TID) | ORAL | Status: DC | PRN
  Administered 2011-09-30: 0.25 mg via ORAL
  Filled 2011-09-30: qty 1

## 2011-09-30 MED ORDER — POLYETHYLENE GLYCOL 3350 17 G PO PACK
17.0000 g | PACK | Freq: Two times a day (BID) | ORAL | Status: DC
  Administered 2011-09-30 – 2011-10-05 (×3): 17 g via ORAL
  Filled 2011-09-30 (×12): qty 1

## 2011-09-30 MED ORDER — ONDANSETRON HCL 4 MG/2ML IJ SOLN: 4.0000 mg | Freq: Four times a day (QID) | INTRAMUSCULAR | Status: DC | PRN

## 2011-09-30 MED ORDER — SODIUM CHLORIDE 0.9 % IJ SOLN: 3.0000 mL | INTRAMUSCULAR | Status: DC | PRN

## 2011-09-30 MED ORDER — HYDROCODONE-ACETAMINOPHEN 5-325 MG PO TABS
1.0000 | ORAL_TABLET | ORAL | Status: DC | PRN
  Administered 2011-10-01: 2 via ORAL
  Administered 2011-10-01: 1 via ORAL
  Administered 2011-10-02 (×3): 2 via ORAL
  Administered 2011-10-03: 1 via ORAL
  Administered 2011-10-04: 2 via ORAL
  Administered 2011-10-04: 1 via ORAL
  Administered 2011-10-04 – 2011-10-05 (×3): 2 via ORAL
  Filled 2011-09-30 (×2): qty 2
  Filled 2011-09-30 (×2): qty 1
  Filled 2011-09-30: qty 2
  Filled 2011-09-30: qty 1
  Filled 2011-09-30 (×5): qty 2
  Filled 2011-09-30: qty 1

## 2011-09-30 MED ORDER — GUAIFENESIN ER 600 MG PO TB12
1200.0000 mg | ORAL_TABLET | Freq: Two times a day (BID) | ORAL | Status: DC
  Administered 2011-09-30 – 2011-10-05 (×11): 1200 mg via ORAL
  Filled 2011-09-30 (×12): qty 2

## 2011-09-30 MED ORDER — WARFARIN - PHYSICIAN DOSING INPATIENT: Freq: Every day | Status: DC

## 2011-09-30 MED ORDER — CHLORHEXIDINE GLUCONATE 0.12 % MT SOLN
OROMUCOSAL | Status: AC
  Administered 2011-09-30: 15 mL via OROMUCOSAL
  Filled 2011-09-30: qty 15

## 2011-09-30 NOTE — ED Notes (Signed)
Patient is resting comfortably. 

## 2011-09-30 NOTE — Progress Notes (Signed)
Pt prepared for transfer to 2100.  PT working with patient walking in hallway with walker.  Walks slow and has difficulty coming to a standing position.  Pt skin intact.  Trach in place # 7 shiley cuffed with cuff deflated.  Pt coughing up clear secretions, manages well himself as he cares for himself at home.  IV left hand saline locked.  Lungs clear.  Alert oriented able to talk by placing him finger over trach.  Daughter here with patient who is power of attorney.  MRSA swab sent due to hx of this.  Positive results placed on isolation with family educated.  Report called to RN on 2100, meds completed except for Zocor, transferred via wheelchair without oxygen at patient request.  Has glass eye right orbit, cleansed twice during stay on 5500, matted together after few hours.

## 2011-09-30 NOTE — ED Provider Notes (Signed)
History     CSN: [id number]  Arrival date & time 09/29/11  2206   First MD Initiated Contact with Patient 09/30/11 0209      Chief Complaint  Patient presents with  . Extremity Weakness    (Consider location/radiation/quality/duration/timing/severity/associated sxs/prior treatment) HPI Comments: 66 year old male with a history of hypertension, congestive heart failure, diabetes, blindness of the right eye, pulmonary embolism, tracheostomy and a history of back surgery. He presents with a complaint of bialteral leg weakness.  This is constant, and has had some difficulty with getting up off the floor, walks at baseline with a walker and denies history of stroke or upper extremity weakness.  He denies any other complaints including back pain, neck pain, fever, chills, nausea, vomiting, shortness of breath or chest pain, leg swelling, numbness of the extremities or visual changes. He states that this has never happened before.  Patient is a 66 y.o. male presenting with extremity weakness. The history is provided by the patient, a relative and medical records.  Extremity Weakness    Past Medical History  Diagnosis Date  . ANXIETY 07/23/2006  . ASTHMA 07/23/2006  . DM 05/23/2007  . HYPERTENSION 07/23/2006  . OSTEOARTHRITIS 07/23/2006  . HYPERCHOLESTEROLEMIA 05/23/2007  . ANEMIA 08/31/2008  . Palpitations 02/10/2008  . ALCOHOL ABUSE, IN REMISSION, HX OF 08/26/2008  . TOBACCO ABUSE, HX OF 08/26/2008  . Pulmonary embolism 03/16/2010  . ED (erectile dysfunction)   . Morbid obesity   . DM retinopathy   . 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 DM neuropathy with neurologic complication   . CHF (congestive heart failure)     With a preserved EF  . 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 Sleep apnea   . Shortness of breath   . CARCINOMA, PROSTATE 09/29/2009    Past Surgical History  Procedure Date  . Back surgery     . Tracheostomy     Family History  Problem Relation Age of Onset  . Cancer Neg Hx     History  Substance Use Topics  . Smoking status: Former Smoker    Quit date: 01/16/2005  . Smokeless tobacco: Not on file  . Alcohol Use: No      Review of Systems  Musculoskeletal: Positive for extremity weakness.  All other systems reviewed and are negative.    Allergies  Review of patient's allergies indicates no known allergies.  Home Medications   Current Outpatient Rx  Name Route Sig Dispense Refill  . ALPRAZOLAM ER 3 MG PO TB24 Oral Take 3 mg by mouth every morning.    Marland Kitchen ALPRAZOLAM 2 MG PO TABS Oral Take 1 tablet (2 mg total) by mouth at bedtime as needed for sleep. 30 tablet 0  . AMLODIPINE BESYLATE 10 MG PO TABS Oral Take 10 mg by mouth daily.    Marland Kitchen BENICAR 40 MG PO TABS  TAKE ONE TABLET BY MOUTH EVERY DAY 90 each 1  . CARVEDILOL 6.25 MG PO TABS Oral Take 1 tablet (6.25 mg total) by mouth 2 (two) times daily with a meal. 60 tablet 5  . CLONIDINE HCL 0.1 MG PO TABS  TAKE ONE TABLET BY MOUTH TWICE DAILY 60 tablet 4  . FUROSEMIDE 40 MG PO TABS Oral Take 40 mg by mouth 2 (two) times daily.    . INSULIN GLARGINE 100 UNIT/ML [place] SOLN Subcutaneous Inject 25 Units into  the skin every morning.     Marland Kitchen METOCLOPRAMIDE HCL 5 MG PO TABS  TAKE ONE TABLET BY MOUTH THREE TIMES DAILY 90 tablet 5  . ONE-DAILY MULTI VITAMINS PO TABS Oral Take 1 tablet by mouth daily.      Marland Kitchen POTASSIUM CHLORIDE CRYS ER 20 MEQ PO TBCR Oral Take 2 tablets (40 mEq total) by mouth daily. 60 tablet 5  . SIMVASTATIN 20 MG PO TABS  TAKE ONE TABLET BY MOUTH AT BEDTIME 30 tablet 5  . WARFARIN SODIUM 10 MG PO TABS Oral Take 5-10 mg by mouth daily. Take 1 tablet on Monday, Wednesday and Friday then take 1/2 tablet all other days    . GLUCOSE BLOOD VI STRP  Use as instructed twice daily 100 each 3  . RELION INSULIN SYRINGE 30G X 5/16" 1 ML MISC  USE AS DIRECTED 100 each 3    BP 129/73  Pulse 78  Temp 98.7 F (37.1 C) (Oral)   Resp 19  SpO2 90%  Physical Exam  Nursing note and vitals reviewed. Constitutional: He appears well-developed and well-nourished. No distress.  HENT:  Head: Normocephalic and atraumatic.  Mouth/Throat: Oropharynx is clear and moist. No oropharyngeal exudate.  Eyes: Conjunctivae normal and EOM are normal. Pupils are equal, round, and reactive to light. Right eye exhibits no discharge. Left eye exhibits no discharge. No scleral icterus.  Neck: Normal range of motion. Neck supple. No JVD present. No thyromegaly present.       Tracheostomy in appropriate position, patent  Cardiovascular: Normal rate, regular rhythm, normal heart sounds and intact distal pulses.  Exam reveals no gallop and no friction rub.   No murmur heard. Pulmonary/Chest: Effort normal and breath sounds normal. No respiratory distress. He has no wheezes. He has no rales.  Abdominal: Soft. Bowel sounds are normal. He exhibits no distension and no mass. There is no tenderness.  Musculoskeletal: Normal range of motion. He exhibits edema (mild bilateral lower extremity edema). He exhibits no tenderness.  Lymphadenopathy:    He has no cervical adenopathy.  Neurological: He is alert. Coordination normal.       Normal mental status, normal speech, normal coordination and strength of the bilateral upper extremities, normal sensation to light touch and pinprick of the bilateral lower extremities, bilateral symmetrical weakness of the legs at the hip flexors and knees, normal strength at the ankles to dorsi flexion and plantar flexion. He is able to lift both legs off the bed a couple of inches.  Skin: Skin is warm and dry. No rash noted. No erythema.  Psychiatric: He has a normal mood and affect. His behavior is normal.    ED Course  Procedures (including critical care time)  Labs Reviewed  COMPREHENSIVE METABOLIC PANEL - Abnormal; Notable for the following:    Glucose, Bld 124 (*)     BUN 31 (*)     Creatinine, Ser 1.79 (*)      AST 41 (*)     GFR calc non Af Amer 38 (*)     GFR calc Af Amer 44 (*)     All other components within normal limits  URINALYSIS, ROUTINE W REFLEX MICROSCOPIC - Abnormal; Notable for the following:    Hgb urine dipstick LARGE (*)     Protein, ur 100 (*)     All other components within normal limits  PROTIME-INR - Abnormal; Notable for the following:    Prothrombin Time 21.3 (*)     INR 1.81 (*)  All other components within normal limits  POCT I-STAT, CHEM 8 - Abnormal; Notable for the following:    BUN 34 (*)     Creatinine, Ser 1.90 (*)     Glucose, Bld 124 (*)     All other components within normal limits  URINE MICROSCOPIC-ADD ON - Abnormal; Notable for the following:    Casts HYALINE CASTS (*)     All other components within normal limits  CBC WITH DIFFERENTIAL  POCT I-STAT TROPONIN I   Dg Chest Port 1 View  09/30/2011  *RADIOLOGY REPORT*  Clinical Data: Weakness, history of asthma and CHF.  PORTABLE CHEST - 1 VIEW  Comparison: 03/10/2011  Findings: Cardiomegaly.  Central vascular congestion.  Tracheostomy tube.  Peribronchial thickening and interstitial prominence. Bibasilar opacities.  Cannot exclude pleural effusions.  No pneumothorax.  Due to technique and patient body habitus, unable to evaluate the spine.  IMPRESSION: Cardiomegaly with suggestion of pulmonary edema.  Retrocardiac space is obscured.  Cannot exclude effusion or consolidation.   Original Report Authenticated By: Waneta Martins, M.D.      1. Lower extremity weakness   2. Renal insufficiency       MDM  Bilateral leg weakness, he can follow commands, check for electrolytes, glucose abnormality, infectious etiology such as UTI, chest x-ray as he has had some hypoxia transiently while in the emergency department. Currently he is not requiring oxygen and his oxygen saturation is 98%.  ED ECG REPORT  I personally interpreted this EKG   Date: 09/30/2011   Rate: 99  Rhythm: normal sinus rhythm  QRS  Axis: left  Intervals: Intraventricular conduction delay  ST/T Wave abnormalities: nonspecific T wave changes  Conduction Disutrbances:nonspecific intraventricular conduction delay  Narrative Interpretation: Premature ventricular contractions  Old EKG Reviewed: none available   The patient has had ongoing weakness, I attempted to ambulate with the patient however he was unable to stand up the bedside for more than a second without falling backwards. Again he has no ataxia either truncal or limb ataxia. He has generalized weakness of the bilateral lower extremities and unfortunately he exceeds the weight limit for the MRI machine. At this time given the inability to do any further workup in the emergency department I have requested the [facility] admitted to the hospital for further evaluation and to start physical therapy and rehabilitation.      Vida Roller, MD 09/30/11 [phone]

## 2011-09-30 NOTE — H&P (Signed)
PATIENT DETAILS Name: Travis Chapman Age: 66 y.o. 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 The patient and his daughter, at baseline does have weakness on his lower extremities-he usually walks with a help of a walker-per patient's daughter-patient has very poor mobility and mostly lies either in bed or in a chair most of the day, he presented to the hospital as yesterday his legs gave away and he has not been easily able to ambulate after that. He subsequently presented to the ED, following which we were asked to admit him for further evaluation and treatment. -Patient denies any fever, shortness of breath, chest pain, headache, abdominal pain. He denies any diarrhea. He denies any recent upper respiratory tract infection. He denies any back pain. He claims that he is able to lift both his legs, however he is not now having difficulty walking as his knees give out.   A patient's daughter-LLERGIES:  No Known Allergies  PAST MEDICAL HISTORY: Past Medical History  Diagnosis Date  . ANXIETY 07/23/2006  . ASTHMA 07/23/2006  . DM 05/23/2007  . HYPERTENSION 07/23/2006  . OSTEOARTHRITIS 07/23/2006  . HYPERCHOLESTEROLEMIA 05/23/2007  . ANEMIA 08/31/2008  . Palpitations 02/10/2008  . ALCOHOL ABUSE, IN REMISSION, HX OF 08/26/2008  . TOBACCO ABUSE, HX OF 08/26/2008  . Pulmonary embolism 03/16/2010  . ED (erectile dysfunction)   . Morbid obesity   . DM retinopathy   . 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 DM neuropathy with neurologic complication   . CHF (congestive heart failure)     With a preserved EF  . 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 Sleep apnea   . Shortness of breath   . CARCINOMA, PROSTATE 09/29/2009    PAST SURGICAL HISTORY: Past Surgical History  Procedure Date  . Back surgery   . Tracheostomy     MEDICATIONS AT HOME: Prior to Admission medications   Medication Sig Start Date End Date Taking? Authorizing Provider  ALPRAZolam (XANAX XR) 3 MG 24 hr tablet Take 3 mg by mouth every morning.   Yes Historical Provider, MD  alprazolam Prudy Feeler) 2 MG tablet Take 1 tablet (2 mg total) by mouth at bedtime as needed for sleep. 09/19/11 10/19/11 Yes Romero Belling, MD  amLODipine (NORVASC) 10 MG tablet Take 10 mg by mouth daily.   Yes Historical Provider, MD  BENICAR 40 MG tablet TAKE ONE TABLET BY MOUTH EVERY DAY 05/11/11  Yes Romero Belling, MD  carvedilol (COREG) 6.25 MG tablet Take 1 tablet (6.25 mg total) by mouth 2 (two) times daily with a meal. 04/18/11 04/17/12 Yes Romero Belling, MD  cloNIDine (CATAPRES) 0.1 MG tablet TAKE ONE TABLET BY MOUTH TWICE DAILY 08/24/11  Yes Romero Belling, MD  furosemide (LASIX) 40 MG tablet Take 40 mg by mouth 2 (two) times daily. 12/02/10 12/02/11 Yes Romero Belling, MD  insulin glargine (LANTUS) 100 UNIT/ML injection Inject 25 Units into the skin every morning.    Yes Historical Provider, MD  metoCLOPramide (REGLAN) 5 MG tablet TAKE ONE TABLET BY MOUTH THREE TIMES DAILY 05/11/11  Yes Romero Belling, MD  Multiple Vitamin (MULTIVITAMIN) tablet Take 1 tablet  by mouth daily.     Yes Historical Provider, MD  potassium chloride SA (K-DUR,KLOR-CON) 20 MEQ tablet Take 2 tablets (40 mEq total) by mouth daily. 09/21/11  Yes Romero Belling, MD  simvastatin (ZOCOR) 20 MG tablet TAKE ONE TABLET BY MOUTH AT BEDTIME 06/08/11  Yes Romero Belling, MD  warfarin (COUMADIN) 10 MG tablet Take 5-10 mg by mouth daily. Take 1 tablet on Monday, Wednesday and Friday then take 1/2 tablet all other days   Yes Historical Provider, MD  glucose blood (ONE TOUCH ULTRA TEST) test strip Use as instructed twice daily 04/03/11 04/02/12  Romero Belling, MD  RELION INSULIN SYR 1CC/30G 30G X 5/16" 1 ML MISC USE AS DIRECTED 05/23/11   Romero Belling, MD    FAMILY HISTORY: Family History  Problem Relation Age of Onset  . Cancer Neg Hx     SOCIAL HISTORY:  reports that he quit smoking about 6 years ago. He does not have any smokeless tobacco history on file. He reports that he does not drink alcohol. His drug history not on file.  REVIEW OF SYSTEMS:  Constitutional:   No  weight loss, night sweats,  Fevers, chills, fatigue.  HEENT:    No headaches, Difficulty swallowing,Tooth/dental problems,Sore throat,  No sneezing, itching, ear ache, nasal congestion, post nasal drip,   Cardio-vascular: No chest pain,  Orthopnea, PND, swelling in lower extremities, anasarca,   dizziness, palpitations  GI:  No heartburn, indigestion, abdominal pain, nausea, vomiting, diarrhea, change in       bowel habits, loss of appetite  Resp: No shortness of breath with exertion or at rest.  No excess mucus, no productive cough, No non-productive cough,  No coughing up of blood.No change in color of mucus.No wheezing.No chest wall deformity  Skin:  no rash or lesions.  GU:  no dysuria, change in color of urine, no urgency or frequency.  No flank pain.  Musculoskeletal: No joint pain or swelling.  No decreased range of motion.  No back pain.  Psych: No change in mood or affect. No depression or anxiety.  No memory loss.   PHYSICAL EXAM: Blood pressure 158/90, pulse 86, temperature 99.8 F (37.7 C), temperature source Oral, resp. rate 20, weight 164.9 kg (363 lb 8.6 oz), SpO2 100.00%.  General appearance :Awake, alert, not in any distress. Speech Clear. Not toxic Looking. trach collar in place.  HEENT: Atraumatic and Normocephalic, pupils equally reactive to light and accomodation Neck: supple, no JVD. No cervical lymphadenopathy.  Chest:Good air entry bilaterally,  minimal scattered rhonchi.  CVS: S1 S2 regular, no murmurs.  Abdomen: Bowel  sounds present, Non tender and not distended with no gaurding, rigidity or rebound. patient obese.  Extremities: B/L Lower Ext shows  1  edema, both legs are warm to touch. Neurology: Awake alert, and oriented X 3, CN II-XII intact, Non focal, he has adequate strength in his upper extremities, he is easily able to give both of his lower extremities against gravity and mild resistance. DTRs are equivocal. Sensation appears to be intact. Skin:No Rash Wounds:N/A  LABS ON ADMISSION:   Basename 09/30/11 0259 09/30/11 0245  NA 143 141  K 4.2 4.2  CL 105 101  CO2 -- 28  GLUCOSE 124* 124*  BUN 34* 31*  CREATININE 1.90* 1.79*  CALCIUM -- 9.8  MG -- --  PHOS -- --    Basename 09/30/11 0245  AST 41*  ALT 22  ALKPHOS 87  BILITOT 0.3  PROT 7.8  ALBUMIN 3.7  No results found for this basename: LIPASE:2,AMYLASE:2 in the last 72 hours  Basename 09/30/11 1050 09/30/11 0259 09/30/11 0245  WBC 7.6 -- 9.8  NEUTROABS -- -- 7.2  HGB 12.5* 14.3 --  HCT 38.5* 42.0 --  MCV 89.7 -- 89.8  PLT 151 -- 172   No results found for this basename: CKTOTAL:3,CKMB:3,CKMBINDEX:3,TROPONINI:3 in the last 72 hours No results found for this basename: DDIMER:2 in the last 72 hours No components found with this basename: POCBNP:3   RADIOLOGIC STUDIES ON ADMISSION: Dg Chest Port 1 View  09/30/2011  *RADIOLOGY REPORT*  Clinical Data: Weakness, history of asthma and CHF.  PORTABLE CHEST - 1 VIEW  Comparison: 03/10/2011  Findings: Cardiomegaly.  Central vascular congestion.  Tracheostomy tube.  Peribronchial thickening and interstitial prominence. Bibasilar opacities.  Cannot exclude pleural effusions.  No pneumothorax.  Due to technique and patient body habitus, unable to evaluate the spine.  IMPRESSION: Cardiomegaly with suggestion of pulmonary edema.  Retrocardiac space is obscured.  Cannot exclude effusion or consolidation.   Original Report Authenticated By: Waneta Martins, M.D.     ASSESSMENT AND  PLAN: Present on Admission:  . Bilateral lower extremity weakness  -Suspect deconditioning-does not have worrisome findings on physical exam  -Does not have any back pain, has a chronic Foley catheter. Denies any fecal incontinence.  -Unfortunately unable to fix an MRI machine given weight, therefore will do a CT scan of his lumbar spine-cannot give contrast given acute or chronic kidney disease.  - Will get physical occupational therapy evaluation  therapy andand see if he progresses appropriately with physical therapy, otherwise will seek neurology evaluation.   .Pulmonary embolism -INR is slightly subtherapeutic, patient claims he has not missed any doses of Coumadin, we'll ask pharmacy to dose and follow INR closely. We'll place on prophylactic heparin until INR is more than 2.   .Acute on chronic renal failure -We'll back off on Lasix-he apparently takes twice a day-for now will make it daily and follow creatinine   .Diastolic HF (heart failure) -Seems him considered at present -Continue with Lasix as outlined above, Coreg -Monitor while in status closely  . 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 Dyslipidemia  -Continue with statin   .HYPOVENTILATION -Secondary to obstructive sleep apnea, obesity hypoventilation syndrome  -Nocturnal ventilator dependent-as above   Further plan will depend as patient's clinical course evolves and further radiologic and laboratory data become available. Patient will be monitored closely.   DVT Prophylaxis: -Subcutaneous heparin until INR therapeutic   Code Status: -Full code   Total time spent for admission equals 45 minutes.  [facility] Pager  [phone]  If 7PM-7AM, please contact night-coverage [email or web address] Password [facility] 09/30/2011, 11:48 AM

## 2011-09-30 NOTE — ED Notes (Signed)
Family at bedside. 

## 2011-09-30 NOTE — Consult Note (Signed)
Reason for Consult: OSA/ OHS w/ trach & nocturnal vent Referring Physician: TriadWynelle Beckmann  Date: 09/30/2011  Patient name: Travis Chapman Medical record number: [id number] Date of birth: 09-Aug-1945 Age: 66 y.o. Gender: male PCP: Romero Belling, MD   History of Present Illness: 66 y/o BM pt of DrEllison at [facility] w/ IDDM & mult medical problems.  He was [facility] in 2065 by DrWright w/ acute on chronic resp failure due to COPD, OSA/ OHS.  He was trached by [employer] during that adm & has been managed all these yrs by [facility](?) ENT from [facility]/ [place]- on TCollar during the day & noctural home vent at night (family does not know settings.  He is adm on this occas w/ bilat LE weakness ?etiology.  Avail records indicate that he saw DrEllison 09/19/11 for f/u of his IDDM w/ renal manifestations, & had c/o LBP for 3 weeks> he is on chronic narcotic analgesics for chronic pain... PMHx also includes Ex-smoker, Hx asthma, Hx PE on Coumadin, & numerous medical problems as below... Pulm/ CCM asked to eval & help w/ resp management while in the hosp... Note> he is on no resp meds other than the trach, TCollar, nocturnal home vent...   Allergies: Review of patient's allergies indicates no known allergies.  Past Medical History  Diagnosis Date  . ANXIETY 07/23/2006  . ASTHMA 07/23/2006  . DM 05/23/2007  . HYPERTENSION 07/23/2006  . OSTEOARTHRITIS 07/23/2006  . HYPERCHOLESTEROLEMIA 05/23/2007  . ANEMIA 08/31/2008  . Palpitations 02/10/2008  . ALCOHOL ABUSE, IN REMISSION, HX OF 08/26/2008  . TOBACCO ABUSE, HX OF 08/26/2008  . Pulmonary embolism 03/16/2010  . ED (erectile dysfunction)   . Morbid obesity   . DM retinopathy   . 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 DM neuropathy with neurologic complication   . CHF (congestive heart failure)     With a preserved EF  . 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 Sleep apnea   .  Shortness of breath   . CARCINOMA, PROSTATE 09/29/2009    Past Surgical History  Procedure Date  . Back surgery   . Tracheostomy     Family History  Problem Relation Age of Onset  . Cancer Neg Hx     History   Social History  . Marital Status: Divorced    Spouse Name: N/A    Number of Children: N/A  . Years of Education: N/A   Occupational History  . Disabled    Social History Main Topics  . Smoking status: Former Smoker    Quit date: 01/16/2005  . Smokeless tobacco: Not on file  . Alcohol Use: No  . Drug Use: Not on file  . Sexually Active: Not on file   Other Topics Concern  . Not on file   Social History Narrative   SingleLives with his elderly mother   Prior to Admission medications   Medication Sig Start Date End Date Taking? Authorizing Provider  ALPRAZolam (XANAX XR) 3 MG 24 hr tablet Take 3 mg by mouth every morning.   Yes Historical Provider, MD  alprazolam Prudy Feeler) 2 MG tablet Take 1 tablet (2 mg total) by mouth at bedtime as needed for sleep. 09/19/11 10/19/11 Yes Romero Belling, MD  amLODipine (NORVASC) 10 MG tablet Take 10 mg by mouth daily.   Yes Historical Provider, MD  BENICAR 40 MG tablet TAKE ONE TABLET  BY MOUTH EVERY DAY 05/11/11  Yes Romero Belling, MD  carvedilol (COREG) 6.25 MG tablet Take 1 tablet (6.25 mg total) by mouth 2 (two) times daily with a meal. 04/18/11 04/17/12 Yes Romero Belling, MD  cloNIDine (CATAPRES) 0.1 MG tablet TAKE ONE TABLET BY MOUTH TWICE DAILY 08/24/11  Yes Romero Belling, MD  furosemide (LASIX) 40 MG tablet Take 40 mg by mouth 2 (two) times daily. 12/02/10 12/02/11 Yes Romero Belling, MD  insulin glargine (LANTUS) 100 UNIT/ML injection Inject 25 Units into the skin every morning.    Yes Historical Provider, MD  metoCLOPramide (REGLAN) 5 MG tablet TAKE ONE TABLET BY MOUTH THREE TIMES DAILY 05/11/11  Yes Romero Belling, MD  Multiple Vitamin (MULTIVITAMIN) tablet Take 1 tablet by mouth daily.     Yes Historical Provider, MD  potassium chloride SA  (K-DUR,KLOR-CON) 20 MEQ tablet Take 2 tablets (40 mEq total) by mouth daily. 09/21/11  Yes Romero Belling, MD  simvastatin (ZOCOR) 20 MG tablet TAKE ONE TABLET BY MOUTH AT BEDTIME 06/08/11  Yes Romero Belling, MD  warfarin (COUMADIN) 10 MG tablet Take 5-10 mg by mouth daily. Take 1 tablet on Monday, Wednesday and Friday then take 1/2 tablet all other days   Yes Historical Provider, MD  glucose blood (ONE TOUCH ULTRA TEST) test strip Use as instructed twice daily 04/03/11 04/02/12  Romero Belling, MD  RELION INSULIN SYR 1CC/30G 30G X 5/16" 1 ML MISC USE AS DIRECTED 05/23/11   Romero Belling, MD        Review of Systems: Constitutional:   No  weight loss, night sweats,  Fevers, chills, sweats. HEENT:   No headaches, Sore throat, No sneezing, itching, ear ache, nasal congestion, post nasal drip,  CV:  No chest pain,  Orthopnea, PND, swelling in lower extremities, anasarca,  dizziness, palpitations GI:  No heartburn, indigestion, abdominal pain, nausea, vomiting, diarrhea, change in bowel habits, loss of appetite Resp: Trach w/ chronic cough, congestion, wheezing, thick secretions, etc... Skin: no rash or lesions. GU: no dysuria, change in color of urine, no urgency or frequency.  MS:  No joint pain or swelling. +decreased range of motion & weakness in LEs Psych:  No change in mood or affect. +depression & anxiety.   Physical Exam:  Filed Vitals:   09/30/11 0744 09/30/11 0850 09/30/11 0911 09/30/11 1047  BP: 139/62 153/65 158/90   Pulse: 80 81 86   Temp:   99.8 F (37.7 C)   TempSrc:   Oral   Resp:  20 20   Weight:    164.9 kg (363 lb 8.6 oz)  SpO2: 100%  100%     Gen: Pleasant, obese, in no distress,  normal affect ENT: No lesions,  mouth clear,  oropharynx clear, no postnasal drip Neck: No JVD, no carotid bruits, fat neck w/ trache- thick white sputum Lungs: No use of accessory muscles, bilat rhonchi, congestion, wheezing... Cardiovascular: RRR, heart sounds normal, no murmur or gallops, no  peripheral edema Abdomen:  Obese, soft and NT, no HSM, BS normal Musculoskeletal: No deformities, no cyanosis or clubbing Neuro: alert, cooperative, weak in bilat LEs Skin: Warm, no lesions or rashes   Labs  Results for orders placed during the hospital encounter of 09/30/11 (from the past 24 hour(s))  CBC WITH DIFFERENTIAL     Status: Normal   Collection Time   09/30/11  2:45 AM      Component Value Range   WBC 9.8  4.0 - 10.5 K/uL   RBC 4.43  4.22 - 5.81 MIL/uL   Hemoglobin 13.1  13.0 - 17.0 g/dL   HCT 69.6  29.5 - 28.4 %   MCV 89.8  78.0 - 100.0 fL   MCH 29.6  26.0 - 34.0 pg   MCHC 32.9  30.0 - 36.0 g/dL   RDW 13.2  44.0 - 10.2 %   Platelets 172  150 - 400 K/uL   Neutrophils Relative 73  43 - 77 %   Neutro Abs 7.2  1.7 - 7.7 K/uL   Lymphocytes Relative 17  12 - 46 %   Lymphs Abs 1.7  0.7 - 4.0 K/uL   Monocytes Relative 9  3 - 12 %   Monocytes Absolute 0.9  0.1 - 1.0 K/uL   Eosinophils Relative 1  0 - 5 %   Eosinophils Absolute 0.1  0.0 - 0.7 K/uL   Basophils Relative 0  0 - 1 %   Basophils Absolute 0.0  0.0 - 0.1 K/uL  COMPREHENSIVE METABOLIC PANEL     Status: Abnormal   Collection Time   09/30/11  2:45 AM      Component Value Range   Sodium 141  135 - 145 mEq/L   Potassium 4.2  3.5 - 5.1 mEq/L   Chloride 101  96 - 112 mEq/L   CO2 28  19 - 32 mEq/L   Glucose, Bld 124 (*) 70 - 99 mg/dL   BUN 31 (*) 6 - 23 mg/dL   Creatinine, Ser 7.25 (*) 0.50 - 1.35 mg/dL   Calcium 9.8  8.4 - 36.6 mg/dL   Total Protein 7.8  6.0 - 8.3 g/dL   Albumin 3.7  3.5 - 5.2 g/dL   AST 41 (*) 0 - 37 U/L   ALT 22  0 - 53 U/L   Alkaline Phosphatase 87  39 - 117 U/L   Total Bilirubin 0.3  0.3 - 1.2 mg/dL   GFR calc non Af Amer 38 (*) >90 mL/min   GFR calc Af Amer 44 (*) >90 mL/min  PROTIME-INR     Status: Abnormal   Collection Time   09/30/11  2:45 AM      Component Value Range   Prothrombin Time 21.3 (*) 11.6 - 15.2 seconds   INR 1.81 (*) 0.00 - 1.49  POCT I-STAT TROPONIN I     Status:  Normal   Collection Time   09/30/11  2:56 AM      Component Value Range   Troponin i, poc 0.05  0.00 - 0.08 ng/mL   Comment 3           POCT I-STAT, CHEM 8     Status: Abnormal   Collection Time   09/30/11  2:59 AM      Component Value Range   Sodium 143  135 - 145 mEq/L   Potassium 4.2  3.5 - 5.1 mEq/L   Chloride 105  96 - 112 mEq/L   BUN 34 (*) 6 - 23 mg/dL   Creatinine, Ser 4.40 (*) 0.50 - 1.35 mg/dL   Glucose, Bld 347 (*) 70 - 99 mg/dL   Calcium, Ion 4.25  9.56 - 1.30 mmol/L   TCO2 28  0 - 100 mmol/L   Hemoglobin 14.3  13.0 - 17.0 g/dL   HCT 38.7  56.4 - 33.2 %  URINALYSIS, ROUTINE W REFLEX MICROSCOPIC     Status: Abnormal   Collection Time   09/30/11  3:22 AM      Component Value Range   Color,  Urine YELLOW  YELLOW   APPearance CLEAR  CLEAR   Specific Gravity, Urine 1.020  1.005 - 1.030   pH 5.0  5.0 - 8.0   Glucose, UA NEGATIVE  NEGATIVE mg/dL   Hgb urine dipstick LARGE (*) NEGATIVE   Bilirubin Urine NEGATIVE  NEGATIVE   Ketones, ur NEGATIVE  NEGATIVE mg/dL   Protein, ur 161 (*) NEGATIVE mg/dL   Urobilinogen, UA 1.0  0.0 - 1.0 mg/dL   Nitrite NEGATIVE  NEGATIVE   Leukocytes, UA NEGATIVE  NEGATIVE  URINE MICROSCOPIC-ADD ON     Status: Abnormal   Collection Time   09/30/11  3:22 AM      Component Value Range   WBC, UA 0-2  <3 WBC/hpf   RBC / HPF 7-10  <3 RBC/hpf   Bacteria, UA RARE  RARE   Casts HYALINE CASTS (*) NEGATIVE   Urine-Other MUCOUS PRESENT    GLUCOSE, CAPILLARY     Status: Abnormal   Collection Time   09/30/11  9:16 AM      Component Value Range   Glucose-Capillary 144 (*) 70 - 99 mg/dL   Imaging results:   Dg Lumbar Spine Complete  09/21/2011  *RADIOLOGY REPORT*  Clinical Data: Low back pain for 3 weeks.  History of prostate cancer.  LUMBAR SPINE - COMPLETE 4+ VIEW  Comparison: Whole body bone scan 01/04/2009.  Findings: There is no fracture or subluxation of the lumbar spine. The patient has marked degenerative disc disease and facet arthropathy from  L3-S1.  Paraspinous structures demonstrate atherosclerotic vascular disease.  IMPRESSION: Marked lower lumbar spondylosis.   Original Report Authenticated By: Bernadene Bell. Maricela Curet, M.D.    Dg Chest Port 1 View  09/30/2011  *RADIOLOGY REPORT*  Clinical Data: Weakness, history of asthma and CHF.  PORTABLE CHEST - 1 VIEW  Comparison: 03/10/2011  Findings: Cardiomegaly.  Central vascular congestion.  Tracheostomy tube.  Peribronchial thickening and interstitial prominence. Bibasilar opacities.  Cannot exclude pleural effusions.  No pneumothorax.  Due to technique and patient body habitus, unable to evaluate the spine.  IMPRESSION: Cardiomegaly with suggestion of pulmonary edema.  Retrocardiac space is obscured.  Cannot exclude effusion or consolidation.   Original Report Authenticated By: Waneta Martins, M.D.     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  PLAN>> Trach care, TCollar during the day, monitor O2 sats carefully for supplemental O2, nocturnal vent at night as at home... >NEBS w/ Albut & Ipratrop Qid >Trach suctioning & check C&S >Mucinex 1200mg  Bid   OTHER PROBLEMS: Adm w/ LE weakness ?etiology Recent c/o LBP- seen by DrEllison 9/3 Pt on chronic narcotic analgesics for chr pain IDDM Renal insuffic HBP Diastolic dysfunction ==> check BNP, & gentle diuresis as tolerated.    Zuma Hust M  09/30/2011

## 2011-09-30 NOTE — Progress Notes (Signed)
ANTICOAGULATION CONSULT NOTE - Initial Consult  Pharmacy Consult for Warfarin  Indication: VTE prophylaxis, h/o PE  No Known Allergies  Patient Measurements: Weight: 363 lb 8.6 oz (164.9 kg)  Vital Signs: Temp: 99.8 F (37.7 C) (09/14 0911) Temp src: Oral (09/14 0911) BP: 158/90 mmHg (09/14 0911) Pulse Rate: 86  (09/14 0911)  Labs:  Basename 09/30/11 1050 09/30/11 0259 09/30/11 0245  HGB 12.5* 14.3 --  HCT 38.5* 42.0 39.8  PLT 151 -- 172  APTT -- -- --  LABPROT -- -- 21.3*  INR -- -- 1.81*  HEPARINUNFRC -- -- --  CREATININE -- 1.90* 1.79*  CKTOTAL -- -- --  CKMB -- -- --  TROPONINI -- -- --    The CrCl is unknown because both a height and weight (above a minimum accepted value) are required for this calculation.   Medical History: Past Medical History  Diagnosis Date  . ANXIETY 07/23/2006  . ASTHMA 07/23/2006  . DM 05/23/2007  . HYPERTENSION 07/23/2006  . OSTEOARTHRITIS 07/23/2006  . HYPERCHOLESTEROLEMIA 05/23/2007  . ANEMIA 08/31/2008  . Palpitations 02/10/2008  . ALCOHOL ABUSE, IN REMISSION, HX OF 08/26/2008  . TOBACCO ABUSE, HX OF 08/26/2008  . Pulmonary embolism 03/16/2010  . ED (erectile dysfunction)   . Morbid obesity   . DM retinopathy   . 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 DM neuropathy with neurologic complication   . CHF (congestive heart failure)     With a preserved EF  . 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 Sleep apnea   . Shortness of breath   . CARCINOMA, PROSTATE 09/29/2009    Medications:  Scheduled:    . albuterol  2.5 mg Nebulization Q6H  . amLODipine  10 mg Oral Daily  . carvedilol  6.25 mg Oral BID WC  . cloNIDine  0.1 mg Oral BID  . furosemide  40 mg Oral Daily  . guaiFENesin  1,200 mg Oral BID  . heparin  5,000 Units Subcutaneous Q8H  . insulin glargine  25 Units Subcutaneous q morning - 10a  . ipratropium  0.5 mg Nebulization Q6H  . metoCLOPramide   5 mg Oral TID AC  . multivitamin with minerals  1 tablet Oral Daily  . polyethylene glycol  17 g Oral BID  . potassium chloride SA  40 mEq Oral Daily  . simvastatin  20 mg Oral q1800  . sodium chloride  3 mL Intravenous Q12H  . warfarin  10 mg Oral Q M,W,F-1800  . warfarin  5 mg Oral Q T,Th,S,Su-1800  . Warfarin - Pharmacist Dosing Inpatient   Does not apply q1800  . DISCONTD: ALPRAZolam  3 mg Oral q morning - 10a  . DISCONTD: multivitamin  1 tablet Oral Daily  . DISCONTD: warfarin  5-10 mg Oral Daily  . DISCONTD: Warfarin - Physician Dosing Inpatient   Does not apply q1800    Assessment: Pt is a 88 YOM with significant PMH who is admitted for LE weakness. 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 His baseline INR today is 1.81. His last INR check at the coumadin clinic was 2.8 on 05/17/11. H/H 12.5/38.5, Plts 151, no evidence of bleeding reported. Scr 1.90<1.70 ( has elevated baseline Scr).   Warfarin home regimen: 10mg  on MWF 5mg  on Tues/Thurs/Sat/Sun  Since pts INR is sub-therapeutic today at 1.81, will provide a slightly increased  dose of warfarin today at 7.5mg  (normal dose for a Saturday would be 5mg )  Goal of Therapy:  INR 2-3 Monitor platelets by anticoagulation protocol: Yes   Plan:  -Warfarin 7.5mg  PO x 1 at 1800 -Daily PT/INR -Monitor CBC, signs of bleeding  Abran Duke, PharmD Clinical Pharmacist Phone: [phone] Pager: [phone] 09/30/2011 12:06 PM

## 2011-09-30 NOTE — ED Notes (Signed)
Patient unable to stand with 2 staff assist or 3 staff assist.  MD Hyacinth Meeker informed

## 2011-09-30 NOTE — Progress Notes (Signed)
Sputum Sample obtained via tracheal suction and sent to lab.

## 2011-09-30 NOTE — ED Notes (Signed)
Attempted to call report at this time.  Was told that Gigi Gin will be the receiving nurse, however, she is unavailable to take report at this time.

## 2011-09-30 NOTE — Progress Notes (Signed)
Stress ulcer prophylaxis ordered

## 2011-09-30 NOTE — Evaluation (Signed)
Physical Therapy Evaluation Patient Details Name: Travis Chapman MRN: [id number] DOB: [birth year] Today's Date: 09/30/2011 Time: 5621-3086 PT Time Calculation (min): 29 min  PT Assessment / Plan / Recommendation Clinical Impression  patient presents with generalized weakness, most likely due to decreased activity at home.  No obvious strength deficits or neuro issues.  Feel patient will improve with increased mobility and general strengthening exercises.  Patient will benefit from PT to increase independence and mobility to baseline level.    PT Assessment  Patient needs continued PT services    Follow Up Recommendations  Home health PT    Barriers to Discharge        Equipment Recommendations  None recommended by PT    Recommendations for Other Services     Frequency Min 3X/week    Precautions / Restrictions Precautions Precautions: Fall   Pertinent Vitals/Pain No pain      Mobility  Bed Mobility Bed Mobility: Supine to Sit Supine to Sit: 5: Supervision;HOB elevated;With rails Transfers Transfers: Sit to Stand;Stand to Sit Sit to Stand: 4: Min assist;With upper extremity assist;From bed Stand to Sit: 4: Min guard;To bed;With upper extremity assist Ambulation/Gait Ambulation/Gait Assistance: 4: Min guard Ambulation Distance (Feet): 80 Feet Assistive device: Rolling walker Gait Pattern: Step-through pattern Gait velocity: decreased    Exercises General Exercises - Lower Extremity Ankle Circles/Pumps: AROM;Both;10 reps;Seated Gluteal Sets: AROM;Both;10 reps;Seated Long Arc Quad: AROM;Both;10 reps;Seated   PT Diagnosis: Generalized weakness  PT Problem List: Decreased activity tolerance;Decreased mobility PT Treatment Interventions: Gait training;Functional mobility training;Therapeutic activities   PT Goals Acute Rehab PT Goals PT Goal Formulation: With patient Time For Goal Achievement: 10/14/11 Potential to Achieve Goals: Good Pt will go Sit to Stand: with  modified independence;with upper extremity assist PT Goal: Sit to Stand - Progress: Goal set today Pt will go Stand to Sit: with modified independence;with upper extremity assist PT Goal: Stand to Sit - Progress: Goal set today Pt will Ambulate: 51 - 150 feet;with modified independence;with rolling walker PT Goal: Ambulate - Progress: Goal set today Pt will Perform Home Exercise Program: Independently PT Goal: Perform Home Exercise Program - Progress: Goal set today  Visit Information  Last PT Received On: 09/30/11 Assistance Needed: +1    Subjective Data  Subjective: daughter reports patient sits around a lot at home and she thinks he is just weak Patient Stated Goal: none stated   Prior Functioning  Home Living Lives With: Family Available Help at Discharge: Family Type of Home: House Home Layout: One level Home Adaptive Equipment: Walker - rolling Prior Function Level of Independence: Independent with assistive device(s) Communication Communication: Tracheostomy    Cognition  Overall Cognitive Status: Appears within functional limits for tasks assessed/performed Arousal/Alertness: Awake/alert Orientation Level: Appears intact for tasks assessed Behavior During Session: [facility] for tasks performed    Extremity/Trunk Assessment Right Upper Extremity Assessment RUE ROM/Strength/Tone: Within functional levels Left Upper Extremity Assessment LUE ROM/Strength/Tone: Within functional levels Right Lower Extremity Assessment RLE ROM/Strength/Tone: Within functional levels Left Lower Extremity Assessment LLE ROM/Strength/Tone: Within functional levels Trunk Assessment Trunk Assessment: Normal   Balance    End of Session PT - End of Session Activity Tolerance: Patient tolerated treatment well Patient left: in bed (sitting EOB with RN and family present)  GP     Olivia Canter, [place] [phone] 09/30/2011, 5:01 PM

## 2011-10-01 DIAGNOSIS — J9610 Chronic respiratory failure, unspecified whether with hypoxia or hypercapnia: Secondary | ICD-10-CM

## 2011-10-01 DIAGNOSIS — J9690 Respiratory failure, unspecified, unspecified whether with hypoxia or hypercapnia: Secondary | ICD-10-CM

## 2011-10-01 LAB — BASIC METABOLIC PANEL
BUN: 18 mg/dL (ref 6–23)
Chloride: 100 mEq/L (ref 96–112)
Glucose, Bld: 134 mg/dL — ABNORMAL HIGH (ref 70–99)
Potassium: 3.8 mEq/L (ref 3.5–5.1)

## 2011-10-01 LAB — CBC
HCT: 37.5 % — ABNORMAL LOW (ref 39.0–52.0)
Hemoglobin: 12.5 g/dL — ABNORMAL LOW (ref 13.0–17.0)
MCHC: 33.3 g/dL (ref 30.0–36.0)
WBC: 6.3 10*3/uL (ref 4.0–10.5)

## 2011-10-01 MED ORDER — WARFARIN SODIUM 5 MG PO TABS
5.0000 mg | ORAL_TABLET | ORAL | Status: DC
  Administered 2011-10-01 – 2011-10-03 (×2): 5 mg via ORAL
  Filled 2011-10-01 (×3): qty 1

## 2011-10-01 MED ORDER — WARFARIN SODIUM 10 MG PO TABS
10.0000 mg | ORAL_TABLET | ORAL | Status: AC
  Administered 2011-10-02: 10 mg via ORAL
  Filled 2011-10-01: qty 1

## 2011-10-01 NOTE — Progress Notes (Signed)
CSW consulted by MD for SNF placement. Per PT, home health is being recommended. CSW signing off as there are no psychosocial needs. Please consult if needs arise.  Lia Foyer, LCSWA [facility] Clinical Social Worker Contact #: [phone] (weekend)

## 2011-10-01 NOTE — Progress Notes (Signed)
Reason for Consult: OSA/ OHS w/ trach & nocturnal vent Referring Physician: Triad[facility]yn[facility] Beckmann  Date: 10/01/2011  Patient name: Travis Chapman Medical record number: [id number] Date of birth: July 19, 1945 Age: 66 y.o. Gender: male PCP: Romero Belling, MD   History of Present Illness: 66 y/o BM with mult medical problems.  He was [facility] in 2006 by DrWright w/ acute on chronic resp failure due to COPD, OSA/ OHS.  He was trached by [employer] during that adm & has been managed all these yrs by [facility](?) ENT from [facility]- on TCollar during the day & noctural home vent at night (family does not know settings).  He is adm 9/14 w/ bilat LE weakness ?etiology. PCCM consulted for nocturnal vent management.   PMHx also includes Ex-smoker, Hx asthma, Hx PE on Coumadin, & numerous medical problems as below... Note> he is on no resp meds other than the trach, TCollar, nocturnal home vent...   Micro:  Sputum 9/14>>>  Abx:  None   Subjective/Overnight:  No acute change overnight.  Currently off vent, on RA, does not want ATC.   Physical Exam:  Filed Vitals:   10/01/11 0600 10/01/11 0700 10/01/11 0755 10/01/11 0850  BP: 135/65 131/74  140/56  Pulse: 69 71  91  Temp:   98.3 F (36.8 C)   TempSrc:   Oral   Resp: 13 16  18   Height:      Weight:      SpO2: 96% 96%  95%    Gen: Pleasant, obese, in no distress,  normal affect ENT: No lesions,  mouth clear,  oropharynx clear, no postnasal drip Neck: No JVD, no carotid bruits, fat neck w/ trache- thick white sputum Lungs: No use of accessory muscles, bilat rhonchi, congestion, no audible wheezing... Cardiovascular: RRR, heart sounds normal, no murmur or gallops, no peripheral edema Abdomen:  Obese, soft and NT, no HSM, BS normal Musculoskeletal: No deformities, no cyanosis or clubbing Neuro: alert, cooperative, weak in bilat LEs Skin: Warm, no lesions or rashes   Labs  Lab 10/01/11 0705 09/30/11 1050 09/30/11 0259 09/30/11 0245  HGB 12.5*  12.5* 14.3 --  HCT 37.5* 38.5* 42.0 --  WBC 6.3 7.6 -- 9.8  PLT 142* 151 -- 172    Lab 10/01/11 0705 09/30/11 1050 09/30/11 0259 09/30/11 0245  NA 140 -- 143 141  K 3.8 -- 4.2 --  CL 100 -- 105 101  CO2 32 -- -- 28  GLUCOSE 134* -- 124* 124*  BUN 18 -- 34* 31*  CREATININE 1.18 1.55* 1.90* 1.79*  CALCIUM 9.2 -- -- 9.8  MG -- -- -- --  PHOS -- -- -- --    Lab 10/01/11 0705 09/30/11 0245  INR 2.13* 1.81*    Imaging results:   Ct Lumbar Spine Wo Contrast  09/30/2011  *RADIOLOGY REPORT*  Clinical Data: Bilateral lower extremity weakness.  Difficulty walking.  The  CT LUMBAR SPINE WITHOUT CONTRAST  Technique:  Multidetector CT imaging of the lumbar spine was performed without intravenous contrast administration. Multiplanar CT image reconstructions were also generated.  Comparison: Radiography 09/21/2011  Findings: Detail is poor due to the patient's size.  There is thoracolumbar curvature convex to the right and lower lumbar curvature convex to the left.  T11-12:  Unremarkable interspace.  Mild facet degeneration but no stenosis.  T12-L1:  Unremarkable interspace.  Mild facet degeneration.  L1-2:  Minimal bulging of the disc.  Mild facet degeneration.  No stenosis.  L2-3:  Mild bulging of the disc.  Mild facet and ligamentous hypertrophy.  No significant stenosis.  L3-4:  Circumferential bulging of the disc.  Mild facet and ligamentous prominence.  Mild stenosis of the lateral recesses and foramina.  L4-5:  There is solid bridging osteophytes that appear to result in functional fusion.  There is facet hypertrophy bilaterally.  There is narrowing of the lateral recesses.  There is foraminal narrowing right more than left.  L5-S1:  There are prominent bridging osteophytes but I cannot determine if these are solid.  There is bilateral facet arthropathy.  There is bulging of the disc.  There is narrowing of the subarticular lateral recesses.  Neural foraminal narrowing is present bilaterally that  could effect either L5 nerve root.  There is osteoarthritis of both sacroiliac joints that could be symptomatic.  IMPRESSION: Mild spinal curvature.  No likely compressive pathology at L2-3 or above.  The patient does have some diffuse facet degeneration which could contribute to back pain.  L3-4:  Disc bulge and facet hypertrophy.  Mild narrowing of the lateral recesses and foramina.  L4-5:  Prominent bridging osteophytes which appear to result in solid fusion.  Facet arthropathy.  Narrowing of the lateral recesses and foramina, right worse than left.  Foraminal stenosis could be significant.  L5-S1:  Bulging of the disc.  Circumferential osteophytes without definite solid fusion.  Bilateral facet arthropathy.  Narrowing of the subarticular lateral recesses and neural foramina that could cause neural compression.  Bilateral sacroiliac osteoarthritis that could be symptomatic.   Original Report Authenticated By: Thomasenia Sales, M.D.    Dg Chest Port 1 View  09/30/2011  *RADIOLOGY REPORT*  Clinical Data: Weakness, history of asthma and CHF.  PORTABLE CHEST - 1 VIEW  Comparison: 03/10/2011  Findings: Cardiomegaly.  Central vascular congestion.  Tracheostomy tube.  Peribronchial thickening and interstitial prominence. Bibasilar opacities.  Cannot exclude pleural effusions.  No pneumothorax.  Due to technique and patient body habitus, unable to evaluate the spine.  IMPRESSION: Cardiomegaly with suggestion of pulmonary edema.  Retrocardiac space is obscured.  Cannot exclude effusion or consolidation.   Original Report Authenticated By: Waneta Martins, M.D.      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  PLAN>> Trach care, TCollar during the day, monitor O2 sats carefully for supplemental O2, nocturnal vent at  night as at home... >NEBS w/ Albut & Ipratrop QID >Trach suctioning & check C&S - pending  >Mucinex 1200mg  Bid   OTHER PROBLEMS per primary: Adm w/ LE weakness ?etiology (staff note: per NP patient has relayed this might be knee pain) Recent c/o LBP- seen by DrEllison 9/3 Pt on chronic narcotic analgesics for chr pain IDDM Renal insuffic HBP Diastolic dysfunction ==> check BNP, & gentle diuresis as tolerated.   Danford Bad, NP 10/01/2011  10:13 AM Pager: [phone] or [phone]  *Care during the described time interval was provided by me and/or other providers on the critical care team. I have reviewed this patient's available data, including medical history, events of note, physical examination and test results as part of my evaluation.    Dr. Kalman Shan, M.D., [facility].C.P Pulmonary and Critical Care Medicine Staff Physician [facility] [place] [facility] Pager: [phone], If no answer or between  15:00h - 7:00h: call [phone]  [phone]  10/01/2011 12:24 PM

## 2011-10-01 NOTE — Progress Notes (Addendum)
ANTICOAGULATION CONSULT NOTE - Initial Consult  Pharmacy Consult for Warfarin  Indication: VTE prophylaxis, h/o PE  No Known Allergies  Patient Measurements: Height: 6\' 2"  (188 cm) Weight: 360 lb 7.2 oz (163.5 kg) IBW/kg (Calculated) : 82.2   Vital Signs: Temp: 98.3 F (36.8 C) (09/15 0755) Temp src: Oral (09/15 0755) BP: 161/69 mmHg (09/15 1100) Pulse Rate: 72  (09/15 1100)  Labs:  Basename 10/01/11 0705 09/30/11 1050 09/30/11 0259 09/30/11 0245  HGB 12.5* 12.5* -- --  HCT 37.5* 38.5* 42.0 --  PLT 142* 151 -- 172  APTT -- -- -- --  LABPROT 24.2* -- -- 21.3*  INR 2.13* -- -- 1.81*  HEPARINUNFRC -- -- -- --  CREATININE 1.18 1.55* 1.90* --  CKTOTAL -- -- -- --  CKMB -- -- -- --  TROPONINI -- -- -- --    Estimated Creatinine Clearance: 101.3 ml/min (by C-G formula based on Cr of 1.18).   Medical History: Past Medical History  Diagnosis Date  . ANXIETY 07/23/2006  . ASTHMA 07/23/2006  . DM 05/23/2007  . HYPERTENSION 07/23/2006  . OSTEOARTHRITIS 07/23/2006  . HYPERCHOLESTEROLEMIA 05/23/2007  . ANEMIA 08/31/2008  . Palpitations 02/10/2008  . ALCOHOL ABUSE, IN REMISSION, HX OF 08/26/2008  . TOBACCO ABUSE, HX OF 08/26/2008  . Pulmonary embolism 03/16/2010  . ED (erectile dysfunction)   . Morbid obesity   . DM retinopathy   . 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 DM neuropathy with neurologic complication   . CHF (congestive heart failure)     With a preserved EF  . 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 Sleep apnea   . Shortness of breath   . CARCINOMA, PROSTATE 09/29/2009    Medications:  Scheduled:     . albuterol  2.5 mg Nebulization Q6H  . amLODipine  10 mg Oral Daily  . antiseptic oral rinse  15 mL Mouth Rinse QID  . carvedilol  6.25 mg Oral BID WC  . chlorhexidine  15 mL Mouth Rinse BID  . Chlorhexidine Gluconate Cloth  6 each Topical Q0600  . cloNIDine  0.1 mg Oral BID  .  furosemide  40 mg Oral Daily  . guaiFENesin  1,200 mg Oral BID  . heparin  5,000 Units Subcutaneous Q8H  . insulin aspart  0-15 Units Subcutaneous TID WC  . insulin glargine  25 Units Subcutaneous q morning - 10a  . ipratropium  0.5 mg Nebulization Q6H  . metoCLOPramide  5 mg Oral TID AC  . multivitamin with minerals  1 tablet Oral Daily  . mupirocin ointment  1 application Nasal BID  . pantoprazole  40 mg Oral Q1200  . polyethylene glycol  17 g Oral BID  . potassium chloride SA  40 mEq Oral Daily  . simvastatin  20 mg Oral q1800  . sodium chloride  3 mL Intravenous Q12H  . warfarin  7.5 mg Oral ONCE-1800  . Warfarin - Pharmacist Dosing Inpatient   Does not apply q1800  . DISCONTD: ALPRAZolam  3 mg Oral q morning - 10a  . DISCONTD: warfarin  10 mg Oral Q M,W,F-1800  . DISCONTD: warfarin  5 mg Oral Q T,Th,S,Su-1800    Assessment: Pt is a 45 YOM with significant PMH who is admitted for LE weakness. 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 INR this am is at goal after  boosted dose last night.  No complications noted.  Warfarin home regimen: 10mg  on MWF 5mg  on Tues/Thurs/Sat/Sun  Goal of Therapy:  INR 2-3 Monitor platelets by anticoagulation protocol: Yes   Plan:  - Resume home coumadin 10 mg MWF, 5 mg TRSS - Daily PT/INR - Monitor CBC, signs/sx of bleeding - Discussed with Dirk Dress, NP - will d/c sq heparin with INR >2  Jill Side L. Illene Bolus, PharmD, BCPS Clinical Pharmacist Pager: [phone] Pharmacy: [phone] 10/01/2011 11:34 AM

## 2011-10-01 NOTE — Progress Notes (Addendum)
[facility] PROGRESS NOTE  Travis Chapman ZOX:[id number] DOB: [birth year] DOA: 09/30/2011 PCP: Romero Belling, MD  Assessment/Plan: Principal Problem:  *Lower extremity weakness Possibly related to nerve root compression due to multilevel spinal stenosis- neurosurgical consult requested - I have spoken with Dr Lovell Sheehan who had reviewed the CT scan- I have explained that patient presented with progressive weakness and a fall- Dr Lovell Sheehan has reviewed the CT and recommends the patient f/u in the office with him -  Phone # [phone]. He also recommends a  PT eval which is already ordered. I have asked him if he feels that an oral prednisone taper would be appropriate - he feels that the patient should not need it at this point.    Pulmonary embolism  -INR is slightly subtherapeutic, patient claims he has not missed any doses of Coumadin, INR has now improved- he was temporarily bridges with Heparin  .Acute on chronic renal failure  -We'll back on Lasix-he apparently takes twice a day-for now will make it daily and follow creatinine   .Diastolic HF (heart failure)  -Seems compensated at present  -Continue with Lasix and Coreg   . 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 Dyslipidemia  -Continue with statin   .HYPOVENTILATION  -Secondary to obstructive sleep apnea, obesity hypoventilation syndrome  -Nocturnal ventilator dependent-as above    Code Status: full code Disposition Plan: due to requiring a vent at night, he is in the SDU DVT prophylaxis:INR therapeutic    Brief narrative: Patient is a 66 year old African American male with a history of obstructive sleep apnea, CHF, chronic tracheostomy in place, ventilator dependent nocturnally who  presents to the hospital for the above noted complaints. The patient and his daughter, at baseline does have weakness on his lower extremities-he usually walks with a help of a walker-per patient's daughter-patient has very poor mobility and mostly lies either in bed or in a chair most of the day- he presented to the hospital as his legs gave away and he has had increased difficult in ambulate after that. He subsequently presented to the ED, following which we were asked to admit him for further evaluation and treatment.  Consultants:  Pulm/critical care  Procedures:  none  Antibiotics:  none  HPI/Subjective: Pt admits to slow progressive weakness and also pain in his knees. At times he feels his legs just give out. This led to his fall yesterday. He clearly has some mild confusion as he states that he has been in the hospital for over a week. Although his fall was yesterday, he states it was the Thursday before last. He does not complain of back pain.    Objective: Filed Vitals:   10/01/11 1300 10/01/11 1400 10/01/11 1423 10/01/11 1632  BP: 110/77 129/90    Pulse: 68 78  70  Temp:      TempSrc:      Resp: 14 23  16   Height:      Weight:      SpO2: 94% 97% 96% 96%    Intake/Output Summary (Last 24 hours) at 10/01/11 1705 Last data filed at 10/01/11 1300  Gross per 24 hour  Intake    880 ml  Output   2975 ml  Net  -2095 ml    Exam:   General:  Obese, no acute distress, alert oriented. Has a trach with small amount of pale yellow mucous draining  Cardiovascular: RRR, no murmurs  Respiratory: CTA b/l   Abdomen: soft, NT, ND BS+  Ext: no c/c/e  Neuro- 4/5 strength b/l lower extremities, 5/5 in b/l upper extremities, CN 2-12 intact.   Data Reviewed: Basic Metabolic Panel:  Lab 10/01/11 1610 09/30/11 1050 09/30/11 0259 09/30/11 0245  NA 140 -- 143 141  K 3.8 -- 4.2 4.2  CL 100 -- 105 101  CO2 32 -- -- 28  GLUCOSE 134* -- 124* 124*  BUN 18 -- 34* 31*    CREATININE 1.18 1.55* 1.90* 1.79*  CALCIUM 9.2 -- -- 9.8  MG -- -- -- --  PHOS -- -- -- --   Liver Function Tests:  Lab 09/30/11 0245  AST 41*  ALT 22  ALKPHOS 87  BILITOT 0.3  PROT 7.8  ALBUMIN 3.7   No results found for this basename: LIPASE:5,AMYLASE:5 in the last 168 hours No results found for this basename: AMMONIA:5 in the last 168 hours CBC:  Lab 10/01/11 0705 09/30/11 1050 09/30/11 0259 09/30/11 0245  WBC 6.3 7.6 -- 9.8  NEUTROABS -- -- -- 7.2  HGB 12.5* 12.5* 14.3 13.1  HCT 37.5* 38.5* 42.0 39.8  MCV 87.4 89.7 -- 89.8  PLT 142* 151 -- 172   Cardiac Enzymes: No results found for this basename: CKTOTAL:5,CKMB:5,CKMBINDEX:5,TROPONINI:5 in the last 168 hours BNP (last 3 results)  Basename 03/10/11 0029  PROBNP 266.8*   CBG:  Lab 10/01/11 1613 10/01/11 1116 10/01/11 0732 09/30/11 2215 09/30/11 1656  GLUCAP 176* 193* 118* 170* 178*    Recent Results (from the past 240 hour(s))  MRSA PCR SCREENING     Status: Abnormal   Collection Time   09/30/11 12:33 PM      Component Value Range Status Comment   MRSA by PCR POSITIVE (*) NEGATIVE Final      Studies:  Ct Lumbar Spine Wo Contrast  09/30/2011  *RADIOLOGY REPORT*  Clinical Data: Bilateral lower extremity weakness.  Difficulty walking.  The  CT LUMBAR SPINE WITHOUT CONTRAST  Technique:  Multidetector CT imaging of the lumbar spine was performed without intravenous contrast administration. Multiplanar CT image reconstructions were also generated.  Comparison: Radiography 09/21/2011  Findings: Detail is poor due to the patient's size.  There is thoracolumbar curvature convex to the right and lower lumbar curvature convex to the left.  T11-12:  Unremarkable interspace.  Mild facet degeneration but no stenosis.  T12-L1:  Unremarkable interspace.  Mild facet degeneration.  L1-2:  Minimal bulging of the disc.  Mild facet degeneration.  No stenosis.  L2-3:  Mild bulging of the disc.  Mild facet and ligamentous hypertrophy.   No significant stenosis.  L3-4:  Circumferential bulging of the disc.  Mild facet and ligamentous prominence.  Mild stenosis of the lateral recesses and foramina.  L4-5:  There is solid bridging osteophytes that appear to result in functional fusion.  There is facet hypertrophy bilaterally.  There is narrowing of the lateral recesses.  There is foraminal narrowing right more than left.  L5-S1:  There are prominent bridging osteophytes but I cannot determine if these are solid.  There is bilateral facet arthropathy.  There is bulging of the disc.  There is narrowing of the subarticular lateral recesses.  Neural foraminal narrowing is present bilaterally that could effect either L5 nerve root.  There is osteoarthritis of both sacroiliac joints that could be symptomatic.  IMPRESSION: Mild  spinal curvature.  No likely compressive pathology at L2-3 or above.  The patient does have some diffuse facet degeneration which could contribute to back pain.  L3-4:  Disc bulge and facet hypertrophy.  Mild narrowing of the lateral recesses and foramina.  L4-5:  Prominent bridging osteophytes which appear to result in solid fusion.  Facet arthropathy.  Narrowing of the lateral recesses and foramina, right worse than left.  Foraminal stenosis could be significant.  L5-S1:  Bulging of the disc.  Circumferential osteophytes without definite solid fusion.  Bilateral facet arthropathy.  Narrowing of the subarticular lateral recesses and neural foramina that could cause neural compression.  Bilateral sacroiliac osteoarthritis that could be symptomatic.   Original Report Authenticated By: Thomasenia Sales, M.D.    Dg Chest Port 1 View  09/30/2011  *RADIOLOGY REPORT*  Clinical Data: Weakness, history of asthma and CHF.  PORTABLE CHEST - 1 VIEW  Comparison: 03/10/2011  Findings: Cardiomegaly.  Central vascular congestion.  Tracheostomy tube.  Peribronchial thickening and interstitial prominence. Bibasilar opacities.  Cannot exclude pleural  effusions.  No pneumothorax.  Due to technique and patient body habitus, unable to evaluate the spine.  IMPRESSION: Cardiomegaly with suggestion of pulmonary edema.  Retrocardiac space is obscured.  Cannot exclude effusion or consolidation.   Original Report Authenticated By: Waneta Martins, M.D.     Scheduled Meds:   . albuterol  2.5 mg Nebulization Q6H  . amLODipine  10 mg Oral Daily  . antiseptic oral rinse  15 mL Mouth Rinse QID  . carvedilol  6.25 mg Oral BID WC  . chlorhexidine  15 mL Mouth Rinse BID  . Chlorhexidine Gluconate Cloth  6 each Topical Q0600  . cloNIDine  0.1 mg Oral BID  . furosemide  40 mg Oral Daily  . guaiFENesin  1,200 mg Oral BID  . insulin aspart  0-15 Units Subcutaneous TID WC  . insulin glargine  25 Units Subcutaneous q morning - 10a  . ipratropium  0.5 mg Nebulization Q6H  . metoCLOPramide  5 mg Oral TID AC  . multivitamin with minerals  1 tablet Oral Daily  . mupirocin ointment  1 application Nasal BID  . pantoprazole  40 mg Oral Q1200  . polyethylene glycol  17 g Oral BID  . potassium chloride SA  40 mEq Oral Daily  . simvastatin  20 mg Oral q1800  . sodium chloride  3 mL Intravenous Q12H  . warfarin  10 mg Oral Q M,W,F-1800  . warfarin  5 mg Oral Q T,Th,S,Su-1800  . warfarin  7.5 mg Oral ONCE-1800  . Warfarin - Pharmacist Dosing Inpatient   Does not apply q1800  . DISCONTD: heparin  5,000 Units Subcutaneous Q8H   Continuous Infusions:   ________________________________________________________________________  Time spent: 35 min    [facility]  [facility] Pager [phone] If 8PM-8AM, please contact night-coverage at [email or web address], password [facility] 10/01/2011, 5:05 PM  LOS: 1 day

## 2011-10-01 NOTE — Progress Notes (Signed)
Pt. States he is on room air at home. He refuses trach collar for aerosol humidity at this time.

## 2011-10-02 DIAGNOSIS — M545 Low back pain, unspecified: Secondary | ICD-10-CM

## 2011-10-02 LAB — GLUCOSE, CAPILLARY
Glucose-Capillary: 202 mg/dL — ABNORMAL HIGH (ref 70–99)
Glucose-Capillary: 140 mg/dL — ABNORMAL HIGH (ref 70–99)
Glucose-Capillary: 162 mg/dL — ABNORMAL HIGH (ref 70–99)

## 2011-10-02 LAB — PROTIME-INR: Prothrombin Time: 21.8 seconds — ABNORMAL HIGH (ref 11.6–15.2)

## 2011-10-02 NOTE — Progress Notes (Signed)
ANTICOAGULATION CONSULT NOTE - Follow up Consult  Pharmacy Consult for Warfarin  Indication: VTE prophylaxis, h/o PE   Labs:  Basename 10/02/11 0610 10/01/11 0705 09/30/11 1050 09/30/11 0259 09/30/11 0245  HGB -- 12.5* 12.5* -- --  HCT -- 37.5* 38.5* 42.0 --  PLT -- 142* 151 -- 172  APTT -- -- -- -- --  LABPROT 21.8* 24.2* -- -- 21.3*  INR 1.86* 2.13* -- -- 1.81*  HEPARINUNFRC -- -- -- -- --  CREATININE -- 1.18 1.55* 1.90* --  CKTOTAL -- -- -- -- --  CKMB -- -- -- -- --  TROPONINI -- -- -- -- --    Estimated Creatinine Clearance: 100.5 ml/min (by C-G formula based on Cr of 1.18).     Assessment: Pt is a 89 YOM with significant PMH who is admitted for LE weakness. 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 INR this am is 1.86 which is slightly below goal. Warfarin home regimen: 10mg  on MWF 5mg  on Tues/Thurs/Sat/Sun  Goal of Therapy:  INR 2-3 Monitor platelets by anticoagulation protocol: Yes   Plan:  - Continue home coumadin 10 mg MWF, 5 mg TRSS.  As patient scheduled for 10mg  this evening, will not increase dose further even though INR slightly below goal. - Daily PT/INR - Monitor CBC, signs/sx of bleeding   Celedonio Miyamoto, PharmD, BCPS Clinical Pharmacist Pager [phone]  10/02/2011 8:25 AM

## 2011-10-02 NOTE — Progress Notes (Signed)
[facility] PROGRESS NOTE  Travis Chapman NFA:[id number] DOB: [birth year] DOA: 09/30/2011 PCP: Romero Belling, MD  Assessment/Plan:  Lower extremity weakness -Possibly related to nerve root compression due to multilevel spinal stenosis- neurosurgical consult requested - Dr Lovell Sheehan reviewed the CT scan- Dr Butler Denmark explained that patient presented with progressive weakness and a fal l- Dr Lovell Sheehan recommended the patient f/u in the office with him -  Phone # [phone] - He also recommends a  PT eval which is already ordered. Dr. Butler Denmark asked him if he feels that an oral prednisone taper would be appropriate - he feels that the patient should not need it at this point.  -Seems at this point that severity of arthritis and persistent pain also may be contributing to the symptoms   Pulmonary embolism  -INR is slightly subtherapeutic, patient claims he has not missed any doses of Coumadin, INR has now improved - he was temporarily bridged with Heparin  Acute on chronic renal failure  -back off on Lasix-he apparently takes twice a day at home-for now will make it daily and follow creatinine   .Diastolic HF (heart failure)  -Seems compensated at present  -Continue with Lasix and Coreg   . 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 Dyslipidemia  -Continue with statin   .HYPOVENTILATION  -Secondary to obstructive sleep apnea, obesity hypoventilation syndrome  -Nocturnal ventilator dependent-as above   Code Status: full code Disposition Plan: due to requiring a vent at night, he is in the SDU. Unclear if patient would benefit from rehabilitative therapy prior to going home but  this may be complicated by patient's chronic nocturnal vent dependence. Unclear if LTAC would be appropriate in regards to providing full rehabilitative care DVT prophylaxis:INR therapeutic    Brief narrative: Patient is a 66 year old male with a history of obstructive sleep apnea, CHF, chronic tracheostomy in place, ventilator dependent nocturnally who presents to the hospital for severe weakness with the inability to ambulate. The patient reported that at baseline he does have weakness on his lower extremities-he usually walks with a help of a walker-per patient's daughter-patient has very poor mobility and mostly lies either in bed or in a chair most of the day- he presented to the hospital as his legs gave away and he has had increased difficult in ambulate after that.   Consultants:  PCCM due to chronic vent management, wch requires PCCM consult   Procedures:  none  Antibiotics:  none  HPI/Subjective: Still complaining of bilateral knee pain as previously documented feels like his knees give out at times.  Objective: Filed Vitals:   10/02/11 0500 10/02/11 0800 10/02/11 0809 10/02/11 1100  BP:  145/76    Pulse:   7 72  Temp:      TempSrc:      Resp:   18 18  Height:      Weight: 161.2 kg (355 lb 6.1 oz)     SpO2:   97% 95%    Intake/Output Summary (Last 24 hours) at 10/02/11 1232 Last data filed at 10/02/11 1100  Gross per 24 hour  Intake   1195 ml  Output   1600 ml  Net   -405 ml    Exam:  General:  Obese, no acute distress, alert oriented. Has a trach with small amount of pale yellow mucous draining  Cardiovascular: RRR, no murmurs  Respiratory: CTA b/l   Abdomen: soft, NT, ND BS+  Ext: no c/c/e  Neuro- 4/5 strength b/l lower extremities, 5/5 in b/l upper extremities, CN 2-12 intact.   Data Reviewed: Basic Metabolic Panel:  Lab 10/01/11 9562 09/30/11 1050 09/30/11 0259 09/30/11 0245  NA 140 -- 143 141  K 3.8 -- 4.2 4.2  CL 100 -- 105 101  CO2 32  -- -- 28  GLUCOSE 134* -- 124* 124*  BUN 18 -- 34* 31*  CREATININE 1.18 1.55* 1.90* 1.79*  CALCIUM 9.2 -- -- 9.8  MG -- -- -- --  PHOS -- -- -- --   Liver Function Tests:  Lab 09/30/11 0245  AST 41*  ALT 22  ALKPHOS 87  BILITOT 0.3  PROT 7.8  ALBUMIN 3.7   CBC:  Lab 10/01/11 0705 09/30/11 1050 09/30/11 0259 09/30/11 0245  WBC 6.3 7.6 -- 9.8  NEUTROABS -- -- -- 7.2  HGB 12.5* 12.5* 14.3 13.1  HCT 37.5* 38.5* 42.0 39.8  MCV 87.4 89.7 -- 89.8  PLT 142* 151 -- 172   BNP (last 3 results)  Basename 03/10/11 0029  PROBNP 266.8*   CBG:  Lab 10/02/11 1218 10/02/11 0822 10/01/11 2239 10/01/11 1644 10/01/11 1613  GLUCAP 186* 140* 202* 213* 176*    Recent Results (from the past 240 hour(s))  MRSA PCR SCREENING     Status: Abnormal   Collection Time   09/30/11 12:33 PM      Component Value Range Status Comment   MRSA by PCR POSITIVE (*) NEGATIVE Final   CULTURE, RESPIRATORY     Status: Normal (Preliminary result)   Collection Time   09/30/11  9:41 PM      Component Value Range Status Comment   Specimen Description TRACHEAL ASPIRATE   Final    Special Requests NONE   Final    Gram Stain PENDING   Incomplete    Culture Culture reincubated for better growth   Final    Report Status PENDING   Incomplete     ________________________________________________________________________  Time spent: 35 min  ELLIS,ALLISON L. ANP  [facility] Pager [phone] If 8PM-8AM, please contact night-coverage at [email or web address], password [facility] 10/02/2011, 12:32 PM  LOS: 2 days    I have personally examined this patient and reviewed the entire database. I have reviewed the above note, made any necessary editorial changes, and agree with its content.  Lonia Blood, MD [facility]

## 2011-10-02 NOTE — Progress Notes (Signed)
Physical Therapy Treatment Patient Details Name: Travis Chapman MRN: [id number] DOB: [birth year] Today's Date: 10/02/2011 Time: 4098-1191 PT Time Calculation (min): 23 min  PT Assessment / Plan / Recommendation Comments on Treatment Session  Pt required increased assist today.  Pt does state that bil knee pain is worse than it has been but if pt does not progress with mobilty pt may need SNF due to living with 65 y/o mother & not having 24 hr assist.      Follow Up Recommendations  Home health PT    Barriers to Discharge        Equipment Recommendations  None recommended by OT    Recommendations for Other Services    Frequency Min 3X/week   Plan Discharge plan remains appropriate    Precautions / Restrictions Precautions Precautions: Fall Restrictions Weight Bearing Restrictions: No   Pertinent Vitals/Pain C/o bil knee pain but not rated.  RN made aware.      Mobility  Bed Mobility Bed Mobility: Not assessed Transfers Transfers: Sit to Stand;Stand to Sit Sit to Stand: 3: Mod assist;With upper extremity assist;With armrests;From chair/3-in-1 Stand to Sit: 4: Min assist;With upper extremity assist;With armrests;To chair/3-in-1 Details for Transfer Assistance: Pt with increased difficulty with transfers today.  Cued pt to use rocking motion to get momentum to achieve standing.  Pt states "I think it's just because ive sat here for so long & have gotten stiff".  Performed 5x's.  Pt did improve with last 2 sit<>stand trials, requiring only min assist to stand.  Cues for hand placement & technique.   Ambulation/Gait Ambulation/Gait Assistance: 4: Min assist Ambulation Distance (Feet): 20 Feet Assistive device: Rolling walker Ambulation/Gait Assistance Details: Distance limited due to pt's knees began to buckle & did not have a chair following behind him.  Pt with flexed posture, shuffle steps, heavy reliance of UE's on RW.   Gait Pattern: Decreased stride length;Shuffle;Trunk  flexed Gait velocity: decreased Stairs: No Wheelchair Mobility Wheelchair Mobility: No    Exercises General Exercises - Lower Extremity Ankle Circles/Pumps: AROM;Both;10 reps Quad Sets: AROM;Both;10 reps Gluteal Sets: AROM;Both;10 reps     PT Goals Acute Rehab PT Goals Time For Goal Achievement: 10/14/11 Potential to Achieve Goals: Good PT Goal: Sit to Stand - Progress: Not progressing PT Goal: Stand to Sit - Progress: Not progressing PT Goal: Ambulate - Progress: Not progressing  Visit Information  Last PT Received On: 10/02/11 Assistance Needed: +2 (safety )    Subjective Data      Cognition  Overall Cognitive Status: Appears within functional limits for tasks assessed/performed Arousal/Alertness: Awake/alert Orientation Level: Appears intact for tasks assessed Behavior During Session: [facility] for tasks performed    Balance  Balance Balance Assessed: No  End of Session PT - End of Session Equipment Utilized During Treatment: Gait belt Activity Tolerance: Patient limited by fatigue;Patient limited by pain Patient left: in chair;with call bell/phone within reach Nurse Communication: Mobility status     Verdell Face, [place] [phone] 10/02/2011

## 2011-10-02 NOTE — Progress Notes (Signed)
Reason for Consult: OSA/ OHS w/ trach & nocturnal vent Referring Physician: [facility] Beckmann  Date: 10/02/2011  Patient name: Travis Chapman Medical record number: [id number] Date of birth: 1945/10/24 Age: 66 y.o. Gender: male PCP: Romero Belling, MD   History of Present Illness: 66 y/o BM with mult medical problems.  He was [facility] in 2006 by DrWright w/ acute on chronic resp failure due to COPD, OSA/ OHS.  He was trached by [employer] during that adm & has been managed all these yrs by [facility] [place]- on TCollar during the day & noctural home vent at night (family does not know settings).  He is adm 9/14 w/ bilat LE weakness ?etiology. PCCM consulted for nocturnal vent management.   PMHx also includes Ex-smoker, Hx asthma, Hx PE on Coumadin, & numerous medical problems as below... Note> he is on no resp meds other than the trach, TCollar, nocturnal home vent...   Micro:  Sputum 9/14>>>  Abx:  None   Subjective/Overnight:  No acute change overnight.  Currently off vent, on RA, does not want ATC.   Physical Exam:  Filed Vitals:   10/02/11 0400 10/02/11 0402 10/02/11 0500 10/02/11 0809  BP: 114/57     Pulse: 64 65  7  Temp: 97.8 F (36.6 C)     TempSrc: Oral     Resp: 12   18  Height:      Weight:   161.2 kg (355 lb 6.1 oz)   SpO2: 100%   97%    Gen: Pleasant, obese, in no distress,  normal affect ENT: No lesions,  mouth clear Neck: No JVD, no carotid bruits, fat neck w/ trache- thick white sputum/ good phonation w/ occlusion of trach Lungs: No use of accessory muscles, clear, decreased in bases Cardiovascular: RRR, heart sounds normal, no murmur or gallops, no peripheral edema Abdomen:  Obese, soft and NT, no HSM, BS normal Musculoskeletal: No deformities, no cyanosis or clubbing Neuro: alert, cooperative, weak in bilat LEs Skin: Warm, no lesions or rashes   Labs  Lab 10/01/11 0705 09/30/11 1050 09/30/11 0259 09/30/11 0245  HGB 12.5* 12.5* 14.3 --    HCT 37.5* 38.5* 42.0 --  WBC 6.3 7.6 -- 9.8  PLT 142* 151 -- 172    Lab 10/01/11 0705 09/30/11 1050 09/30/11 0259 09/30/11 0245  NA 140 -- 143 141  K 3.8 -- 4.2 --  CL 100 -- 105 101  CO2 32 -- -- 28  GLUCOSE 134* -- 124* 124*  BUN 18 -- 34* 31*  CREATININE 1.18 1.55* 1.90* 1.79*  CALCIUM 9.2 -- -- 9.8  MG -- -- -- --  PHOS -- -- -- --    Lab 10/02/11 0610 10/01/11 0705 09/30/11 0245  INR 1.86* 2.13* 1.81*    Imaging results:    Assessment & Plan   PULM DIAGNOSES: Morbid Obesity OSA/ OHS w/ permanent trach & on nocturnal vent Underlying COPD 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  Currently at baseline pulm status.   Recommendation -Trach care, TCollar during the day, monitor O2 sats carefully for supplemental O2, nocturnal vent at night as at home... -NEBS w/ Albut & Ipratrop QID -Trach suctioning & check C&S - pending  -Mucinex 1200mg  Bid  OTHER PROBLEMS per primary: Adm w/ LE weakness ?etiology (staff note: per NP patient has relayed this might be knee pain) Recent c/o LBP- seen by DrEllison 9/3 Pt on chronic narcotic  analgesics for chr pain IDDM Renal insuffic HBP Diastolic dysfunction ==> check BNP, & gentle diuresis as tolerated.  We will see again on 9/18 BABCOCK,PETE,   10/02/2011 10:22 AM    Attending Addendum:  I have seen the patient, discussed the issues, test results and plans with Kreg Shropshire, NP. I agree with the Assessment and Plans as ammended above.   Levy Pupa, MD, PhD 10/02/2011, 12:26 PM [facility] [phone] or if no answer [phone]

## 2011-10-02 NOTE — Evaluation (Signed)
Occupational Therapy Evaluation Patient Details Name: Travis Chapman MRN: [id number] DOB: Jan 19, 1945 Today's Date: 10/02/2011 Time: 4098-1191 OT Time Calculation (min): 10 min  OT Assessment / Plan / Recommendation Clinical Impression  Pt admitted with generalized weakness in the setting of COPD with trach, OSA/OHA, obesity, and DJD of B knees per pt.  Pt is at a supervision level in mobility and ADL.  Will have supervision of his family as needed at home.  No further OT needs.    OT Assessment  Patient does not need any further OT services    Follow Up Recommendations  Supervision - Intermittent;No OT follow up    Barriers to Discharge      Equipment Recommendations  None recommended by OT    Recommendations for Other Services    Frequency       Precautions / Restrictions Precautions Precautions: Fall Restrictions Weight Bearing Restrictions: No   Pertinent Vitals/Pain     ADL  Eating/Feeding: Simulated;Independent Where Assessed - Eating/Feeding: Chair Grooming: Performed;Wash/dry hands;Supervision/safety Where Assessed - Grooming: Unsupported standing Upper Body Bathing: Simulated;Set up Where Assessed - Upper Body Bathing: Unsupported sitting Lower Body Bathing: Supervision/safety Where Assessed - Lower Body Bathing: Supported sit to stand Upper Body Dressing: Set up;Performed Where Assessed - Upper Body Dressing: Unsupported sitting Lower Body Dressing: Performed;Supervision/safety Where Assessed - Lower Body Dressing: Supported sit to stand Toilet Transfer: Buyer, retail Method: Sit to stand Equipment Used: Rolling walker Transfers/Ambulation Related to ADLs: supervision with RW ADL Comments: Pt introduced to AE for LB ADL for energy conservation.  Prefers to continue his methods of bringing his foot up onto the bed or edge of tub for bathing and dressing.    OT Diagnosis:    OT Problem List:   OT Treatment Interventions:     OT  Goals    Visit Information  Last OT Received On: 10/02/11 Assistance Needed: +1    Subjective Data  Subjective: "The shots don't help my knees anymore." Patient Stated Goal: Home with elderly mother.   Prior Functioning  Vision/Perception  Home Living Lives With: Other (Comment) (10 year old mother) Available Help at Discharge: Family Type of Home: House Home Layout: One level Bathroom Shower/Tub: Tub/shower unit;Curtain Firefighter: Standard Bathroom Accessibility: Yes How Accessible: Accessible via walker Home Adaptive Equipment: Walker - rolling;Tub transfer bench;Hand-held shower hose Prior Function Level of Independence: Independent with assistive device(s) Communication Communication: Tracheostomy Dominant Hand: Right      Cognition  Overall Cognitive Status: Appears within functional limits for tasks assessed/performed Arousal/Alertness: Awake/alert Orientation Level: Appears intact for tasks assessed Behavior During Session: [facility] for tasks performed    Extremity/Trunk Assessment Right Upper Extremity Assessment RUE ROM/Strength/Tone: Within functional levels Left Upper Extremity Assessment LUE ROM/Strength/Tone: Within functional levels Trunk Assessment Trunk Assessment: Normal   Mobility  Shoulder Instructions  Bed Mobility Bed Mobility: Not assessed Transfers Transfers: Sit to Stand;Stand to Sit Sit to Stand: 5: Supervision;With upper extremity assist;From chair/3-in-1 Stand to Sit: 5: Supervision;To chair/3-in-1;With upper extremity assist       Exercise     Balance     End of Session OT - End of Session Activity Tolerance: Patient tolerated treatment well Patient left: in chair;with call bell/phone within reach;with family/visitor present  GO     Evern Bio 10/02/2011, 11:54 AM [phone]

## 2011-10-02 NOTE — Progress Notes (Signed)
Utilization Review Completed.Travis Chapman T9/16/2013

## 2011-10-03 DIAGNOSIS — D649 Anemia, unspecified: Secondary | ICD-10-CM

## 2011-10-03 DIAGNOSIS — I472 Ventricular tachycardia, unspecified: Secondary | ICD-10-CM

## 2011-10-03 DIAGNOSIS — M1990 Unspecified osteoarthritis, unspecified site: Secondary | ICD-10-CM

## 2011-10-03 LAB — CULTURE, RESPIRATORY W GRAM STAIN

## 2011-10-03 LAB — PROTIME-INR
INR: 2.12 — ABNORMAL HIGH (ref 0.00–1.49)
Prothrombin Time: 24.1 seconds — ABNORMAL HIGH (ref 11.6–15.2)

## 2011-10-03 LAB — GLUCOSE, CAPILLARY
Glucose-Capillary: 124 mg/dL — ABNORMAL HIGH (ref 70–99)
Glucose-Capillary: 156 mg/dL — ABNORMAL HIGH (ref 70–99)

## 2011-10-03 MED ORDER — WARFARIN SODIUM 10 MG PO TABS: 10.0000 mg | ORAL_TABLET | ORAL | Status: DC

## 2011-10-03 MED ORDER — WARFARIN SODIUM 10 MG PO TABS
10.0000 mg | ORAL_TABLET | ORAL | Status: DC
  Administered 2011-10-04: 10 mg via ORAL
  Filled 2011-10-03: qty 1

## 2011-10-03 NOTE — Progress Notes (Signed)
[facility] PROGRESS NOTE  Yahye Siebert VHQ:[id number] DOB: 1945/04/28 DOA: 09/30/2011 PCP: Romero Belling, MD  Assessment/Plan:  Lower extremity weakness -Possibly related to nerve root compression due to multilevel spinal stenosis- neurosurgical consult requested - Dr Lovell Sheehan reviewed the CT scan- Dr Butler Denmark explained that patient presented with progressive weakness and a fal l- Dr Lovell Sheehan recommended the patient f/u in the office with him -  Phone # [phone] - He also recommends a  PT eval which is already ordered. Dr. Butler Denmark asked him if he feels that an oral prednisone taper would be appropriate - he feels that the patient should not need it at this point.  -Seems at this point that severity of arthritis and persistent pain also may be contributing to the symptoms -add extra strength Tylenol for pain management and consider Tylenol No. 3 if plain Tylenol not adequately controlling pain symptoms -Have completed face-to-face for home health PT/OT  Pulmonary embolism  -INR is slightly subtherapeutic, patient claims he has not missed any doses of Coumadin, INR has now improved - he was temporarily bridged with Heparin  Acute on chronic renal failure  -back off on Lasix-he apparently takes twice a day at home-for now will make it daily and follow creatinine   .Diastolic HF (heart failure)  -Seems compensated at present  -Continue with Lasix and Coreg   . 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 Dyslipidemia  -Continue with statin   .HYPOVENTILATION  -Secondary to obstructive sleep apnea, obesity hypoventilation syndrome  -Nocturnal ventilator dependent-as above    Code Status: full code Disposition Plan: due to requiring a vent at night, he is in the SDU. Discussed with the patient today and he believes he will be able to manage at home as prior to admission and current focuses continuing inpatient PT and pain management and hopeful for discharge within the next 24-48 hours to DVT prophylaxis:INR therapeutic    Brief narrative: Patient is a 66 year old male with a history of obstructive sleep apnea, CHF, chronic tracheostomy in place, ventilator dependent nocturnally who presents to the hospital for severe weakness with the inability to ambulate. The patient reported that at baseline he does have weakness on his lower extremities-he usually walks with a help of a walker-per patient's daughter-patient has very poor mobility and mostly lies either in bed or in a chair most of the day- he presented to the hospital as his legs gave away and he has had increased difficult in ambulate after that.   Consultants:  PCCM due to chronic vent management, wch requires PCCM consult   Procedures:  none  Antibiotics:  none  HPI/Subjective: Still complaining of bilateral knee pain as previously documented feels like his knees give out at times.  Objective: Filed Vitals:   10/03/11 0747 10/03/11 0806 10/03/11 0813 10/03/11 0900  BP: 136/98   108/54  Pulse: 47  77   Temp: 97.6 F (36.4 C)     TempSrc: Oral     Resp: 14  14   Height:      Weight:      SpO2: 96% 97%  94%    Intake/Output Summary (Last 24 hours) at 10/03/11 1159 Last data filed at 10/03/11 1027  Gross per 24 hour  Intake    840 ml  Output   1000 ml  Net   -160 ml    Exam:   General:  Obese, no acute distress, alert oriented. Has a trach with small amount of pale yellow mucous draining  Cardiovascular: RRR, no murmurs  Respiratory: CTA b/l   Abdomen: soft, NT, ND BS+  Ext: no c/c/e  Neuro- 4/5 strength b/l lower extremities, 5/5 in b/l upper extremities, CN 2-12 intact.    Data Reviewed: Basic Metabolic Panel:  Lab 10/01/11 1478 09/30/11 1050 09/30/11 0259 09/30/11 0245  NA 140 -- 143 141  K 3.8 -- 4.2 4.2  CL 100 -- 105 101  CO2 32 -- -- 28  GLUCOSE 134* -- 124* 124*  BUN 18 -- 34* 31*  CREATININE 1.18 1.55* 1.90* 1.79*  CALCIUM 9.2 -- -- 9.8  MG -- -- -- --  PHOS -- -- -- --   Liver Function Tests:  Lab 09/30/11 0245  AST 41*  ALT 22  ALKPHOS 87  BILITOT 0.3  PROT 7.8  ALBUMIN 3.7   CBC:  Lab 10/01/11 0705 09/30/11 1050 09/30/11 0259 09/30/11 0245  WBC 6.3 7.6 -- 9.8  NEUTROABS -- -- -- 7.2  HGB 12.5* 12.5* 14.3 13.1  HCT 37.5* 38.5* 42.0 39.8  MCV 87.4 89.7 -- 89.8  PLT 142* 151 -- 172   BNP (last 3 results)  Basename 03/10/11 0029  PROBNP 266.8*   CBG:  Lab 10/03/11 0818 10/02/11 2211 10/02/11 1610 10/02/11 1218 10/02/11 0822  GLUCAP 124* 162* 216* 186* 140*    Recent Results (from the past 240 hour(s))  MRSA PCR SCREENING     Status: Abnormal   Collection Time   09/30/11 12:33 PM      Component Value Range Status Comment   MRSA by PCR POSITIVE (*) NEGATIVE Final   CULTURE, RESPIRATORY     Status: Normal   Collection Time   09/30/11  9:41 PM      Component Value Range Status Comment   Specimen Description TRACHEAL ASPIRATE   Final    Special Requests NONE   Final    Gram Stain     Final    Value: MODERATE WBC PRESENT, PREDOMINANTLY PMN     FEW SQUAMOUS EPITHELIAL CELLS PRESENT     MODERATE GRAM POSITIVE COCCI IN PAIRS     MODERATE GRAM NEGATIVE RODS     FEW GRAM NEGATIVE COCCI   Culture Non-Pathogenic Oropharyngeal-type Flora Isolated.   Final    Report Status 10/03/2011 FINAL   Final     ________________________________________________________________________  Time spent: 35 min  ELLIS,ALLISON L. ANP  [facility] Pager [phone] If 8PM-8AM, please contact night-coverage at [email or web address], password [facility] 10/03/2011, 11:59 AM  LOS: 3 days     I have examined the patient and reviewed the chart. I  have modified the above note and agree with it.  Calvert Cantor, MD [phone]

## 2011-10-03 NOTE — Care Management Note (Signed)
    Page 1 of 2   10/03/2011     3:41:18 PM   CARE MANAGEMENT NOTE 10/03/2011  Patient:  Travis Chapman, Travis Chapman   Account Number:  [id number]  Date Initiated:  10/03/2011  Documentation initiated by:  Avie Arenas  Subjective/Objective Assessment:   Weakness - nocturnal vent  Has daughter.     Action/Plan:   Anticipated DC Date:  10/06/2011   Anticipated DC Plan:  HOME W HOME HEALTH SERVICES      DC Planning Services  CM consult      Choice offered to / List presented to:          [facility] arranged  HH-2 PT      [facility] agency  [facility]   Status of service:  In process, will continue to follow Medicare Important Message given?   (If response is "NO", the following Medicare IM given date fields will be blank) Date Medicare IM given:   Date Additional Medicare IM given:    Discharge Disposition:    Per UR Regulation:  Reviewed for med. necessity/level of care/duration of stay  If discussed at Long Length of Stay Meetings, dates discussed:    Comments:  ContactAldine, [place] Daughter [phone]  10-03-11 9:30am Avie Arenas, RNBSN [phone] Talked with patient in room.  Patient OOB in chair.  Has a home vent from home - cannot remeber name.  He puts self on at night.  Lives with his mother - both assist with cooking and home duties.  He admits to getting weak and shakey at home and not getting around very well.  Due to insurance and nocturnal vent - discharge options - other than home are limited.  However - being he is independent with vent, may have a SNF with a short term bed that may take him for rehab if he brings his own vent.  Gets his vent through [facility].  Have discussed option with [facility] - what type of vent, settings and can he take his own to a facility for ST rehab and they will call me back.  PT evaled and recommending HH PT.  Patient does not want to go to SNF even ST.  Feels can get stronger at home. Referral made to [facility] for HHPT.  Patient does not want  OT - just PT.

## 2011-10-03 NOTE — Progress Notes (Signed)
ANTICOAGULATION CONSULT NOTE - Follow up Consult  Pharmacy Consult for Warfarin  Indication: VTE prophylaxis, h/o PE   Labs:  Basename 10/03/11 0505 10/02/11 0610 10/01/11 0705 09/30/11 1050  HGB -- -- 12.5* 12.5*  HCT -- -- 37.5* 38.5*  PLT -- -- 142* 151  APTT -- -- -- --  LABPROT 24.1* 21.8* 24.2* --  INR 2.12* 1.86* 2.13* --  HEPARINUNFRC -- -- -- --  CREATININE -- -- 1.18 1.55*  CKTOTAL -- -- -- --  CKMB -- -- -- --  TROPONINI -- -- -- --    Estimated Creatinine Clearance: 100.9 ml/min (by C-G formula based on Cr of 1.18).     Assessment: Pt is a 35 YOM with significant PMH who is admitted for LE weakness. 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 INR this am is 2.12 which is slightly at goal. Warfarin home regimen: 10mg  on MWF 5mg  on Tues/Thurs/Sat/Sun  Goal of Therapy:  INR 2-3 Monitor platelets by anticoagulation protocol: Yes   Plan:  - Continue home coumadin 10 mg MWF, 5 mg TRSS.   - Check PT/INR on Tues/Thurs/Sat - Monitor CBC, signs/sx of bleeding   Celedonio Miyamoto, PharmD, [facility] Clinical Pharmacist Pager [phone]  10/03/2011 7:18 AM

## 2011-10-04 LAB — GLUCOSE, CAPILLARY
Glucose-Capillary: 146 mg/dL — ABNORMAL HIGH (ref 70–99)
Glucose-Capillary: 228 mg/dL — ABNORMAL HIGH (ref 70–99)

## 2011-10-04 NOTE — Clinical Social Work Psychosocial (Signed)
     Clinical Social Work Department BRIEF PSYCHOSOCIAL ASSESSMENT 10/04/2011  Patient:  Travis Chapman, Travis Chapman     Account Number:  [id number]     Admit date:  09/29/2011  Clinical Social Worker:  Margaree Mackintosh  Date/Time:  10/04/2011 11:16 AM  Referred by:  Care Management  Date Referred:  10/04/2011 Referred for  SNF Placement   Other Referral:   Interview type:  Patient Other interview type:    PSYCHOSOCIAL DATA Living Status:  FAMILY Admitted from facility:   Level of care:   Primary support name:  Raynelle Fanning: [phone] Primary support relationship to patient:  CHILD, ADULT Degree of support available:   Unknown.    CURRENT CONCERNS Current Concerns  Post-Acute Placement   Other Concerns:    SOCIAL WORK ASSESSMENT / PLAN Clinical Social Worker recieved request from [facility] to meet with pt to review SNF option.  CSW reviewed chart and staffed case with RNCM.  CSW met with pt.  CSW introduced self, explained role, and provided support.  CSW reviewed SNF options with pt-Pt declined SNF option. Pt would like to go home and work on PT in his home environment.  Pt states he has supports at home and is motivated to work on PT to improve strength.  CSW to sign off at this time.  CSW updated RNCM. Please re consult if needed.   Assessment/plan status:  No Further Intervention Required Other assessment/ plan:   Information/referral to community resources:   SNF.    PATIENTS/FAMILYS RESPONSE TO PLAN OF CARE: Pt was sitting upright in chart.  Pt was engaged in conversation.  Pt thanked CSW for intervention.

## 2011-10-04 NOTE — Progress Notes (Signed)
[facility] PROGRESS NOTE  Travis Chapman FAO:[id number] DOB: 06/26/1945 DOA: 09/30/2011 PCP: Romero Belling, MD  Assessment/Plan:  Lower extremity weakness -Possibly related to nerve root compression due to multilevel spinal stenosis- neurosurgical consult requested - Dr Lovell Sheehan reviewed the CT scan- Dr Butler Denmark explained that patient presented with progressive weakness and a fal l- Dr Lovell Sheehan recommended the patient f/u in the office with him -  Phone # [phone] - He also recommends a  PT eval which is already ordered. Dr. Butler Denmark asked him if he feels that an oral prednisone taper would be appropriate - he feels that the patient should not need it at this point.  -Seems at this point that severity of arthritis and persistent pain also may be contributing to the symptoms -add extra strength Tylenol for pain management and consider Tylenol No. 3 if plain Tylenol not adequately controlling pain symptoms -Have completed face-to-face for home health PT/OT  Chronic deconditioning -Patient clinically ready for discharge within the next 24 hours. He continues to endorse weakness and as noted above we have ordered home health PT -He has been given the option of going short term to a skilled nursing facility for a short rehabilitation. Because he is independent with the management of his ventilator this will provide more nursing facility options. Unfortunately the patient has declined our offer for rehabilitative therapy in a skilled nursing facility -Based on clinical exam on 10/04/2011 patient should be ready for discharge on 10/05/2011 and if he declines the rehabilitative therapy option he will need to discharge to home  Pulmonary embolism  -INR is slightly subtherapeutic, patient claims he has not missed any doses of Coumadin, INR has now improved - he was temporarily bridged with Heparin  Acute on chronic renal failure  -back off on Lasix-he apparently takes twice a day at home-for now will make it  daily and follow creatinine   .Diastolic HF (heart failure)  -Seems compensated at present  -Continue with Lasix and Coreg   . 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 Dyslipidemia  -Continue with statin   .HYPOVENTILATION  -Secondary to obstructive sleep apnea, obesity hypoventilation syndrome  -Nocturnal ventilator dependent-as above   Code Status: full code Disposition Plan: due to requiring a vent at night, he is in the SDU. Discussed with the patient today and he believes he will be able to manage at home as prior to admission and current focuses continuing inpatient PT and pain management and hopeful for discharge within the next 24 hours to home versus skilled nursing facility for rehabilitation DVT prophylaxis:INR therapeutic    Brief narrative: Patient is a 66 year old male with a history of obstructive sleep apnea, CHF, chronic tracheostomy in place, ventilator dependent nocturnally who presents to the hospital for severe weakness with the inability to ambulate. The patient reported that at baseline he does have weakness on his lower extremities-he usually walks with a help of a walker-per patient's daughter-patient has very poor mobility and mostly lies either in bed or in a chair most of the day- he presented to the hospital as his legs gave away and he has had increased difficult in ambulate after that.   Consultants:  PCCM due to chronic  vent management, [facility] requires PCCM consult   Procedures:  none  Antibiotics:  none  HPI/Subjective: Still complaining of bilateral knee pain as previously documented feels like his knees give out at  times.  Objective: Filed Vitals:   10/04/11 0800 10/04/11 0900 10/04/11 1129 10/04/11 1236  BP: 94/70 110/69 110/69 111/81  Pulse:   76 70  Temp:  97.8 F (36.6 C)  97.9 F (36.6 C)  TempSrc:  Oral  Oral  Resp: 17  18 18   Height:      Weight:      SpO2: 92%  97% 95%    Intake/Output Summary (Last 24 hours) at 10/04/11 1440 Last data filed at 10/04/11 0000  Gross per 24 hour  Intake      0 ml  Output    925 ml  Net   -925 ml    Exam:   General:  Obese, no acute distress, alert oriented. Has a trach with small amount of pale yellow mucous draining  Cardiovascular: RRR, no murmurs  Respiratory: CTA b/l   Abdomen: soft, NT, ND BS+  Ext: no c/c/e  Neuro- 4/5 strength b/l lower extremities, 5/5 in b/l upper extremities, CN 2-12 intact.   Data Reviewed: Basic Metabolic Panel:  Lab 10/01/11 1610 09/30/11 1050 09/30/11 0259 09/30/11 0245  NA 140 -- 143 141  K 3.8 -- 4.2 4.2  CL 100 -- 105 101  CO2 32 -- -- 28  GLUCOSE 134* -- 124* 124*  BUN 18 -- 34* 31*  CREATININE 1.18 1.55* 1.90* 1.79*  CALCIUM 9.2 -- -- 9.8  MG -- -- -- --  PHOS -- -- -- --   Liver Function Tests:  Lab 09/30/11 0245  AST 41*  ALT 22  ALKPHOS 87  BILITOT 0.3  PROT 7.8  ALBUMIN 3.7   CBC:  Lab 10/01/11 0705 09/30/11 1050 09/30/11 0259 09/30/11 0245  WBC 6.3 7.6 -- 9.8  NEUTROABS -- -- -- 7.2  HGB 12.5* 12.5* 14.3 13.1  HCT 37.5* 38.5* 42.0 39.8  MCV 87.4 89.7 -- 89.8  PLT 142* 151 -- 172   BNP (last 3 results)  Basename 03/10/11 0029  PROBNP 266.8*   CBG:  Lab 10/04/11 1201 10/04/11 0739 10/03/11 2106 10/03/11 1650 10/03/11 1204  GLUCAP 161* 118* 178* 193* 156*    Recent Results (from the past 240 hour(s))  MRSA PCR SCREENING     Status: Abnormal   Collection Time   09/30/11 12:33 PM      Component Value Range Status Comment   MRSA by PCR POSITIVE (*) NEGATIVE Final   CULTURE, RESPIRATORY     Status: Normal   Collection Time   09/30/11  9:41 PM      Component Value  Range Status Comment   Specimen Description TRACHEAL ASPIRATE   Final    Special Requests NONE   Final    Gram Stain     Final    Value: MODERATE WBC PRESENT, PREDOMINANTLY PMN     FEW SQUAMOUS EPITHELIAL CELLS PRESENT     MODERATE GRAM POSITIVE COCCI IN PAIRS     MODERATE GRAM NEGATIVE RODS     FEW GRAM NEGATIVE COCCI   Culture Non-Pathogenic Oropharyngeal-type Flora Isolated.   Final    Report Status 10/03/2011 FINAL   Final     ________________________________________________________________________  Time spent: 35 min  ELLIS,ALLISON L. ANP  [facility] Pager [phone] If 8PM-8AM, please contact night-coverage at [email or web address], password [facility] 10/04/2011, 2:40 PM  LOS: 4  days    I have personally examined this patient and reviewed the entire database. I have reviewed the above note, made any necessary editorial changes, and agree with its content.  Lonia Blood, MD [facility]

## 2011-10-04 NOTE — Progress Notes (Signed)
Reason for Consult: OSA/ OHS w/ trach & nocturnal vent Referring Physician: Triad[facility]ynelle Beckmann  Date: 10/04/2011  Patient name: Travis Chapman Medical record number: [id number] Date of birth: 01-03-1946 Age: 66 y.o. Gender: male PCP: Romero Belling, MD   History of Present Illness: 66 y/o BM with mult medical problems.  He was [facility] in 2006 by DrWright w/ acute on chronic resp failure due to COPD, OSA/ OHS.  He was trached by [employer] during that adm & has been managed all these yrs by [facility] ENT from [facility]/ [place]- on TCollar during the day & noctural home vent at night (family does not know settings).  He is adm 9/14 w/ bilat LE weakness ?etiology. PCCM consulted for nocturnal vent management.   PMHx also includes Ex-smoker, Hx asthma, Hx PE on Coumadin, & numerous medical problems as below... Note> he is on no resp meds other than the trach, TCollar, nocturnal home vent...  Micro:  Sputum 9/14>>> normal flora  Abx:  None   Subjective/Overnight:  Up to chair and talking on the phone.  Planning for home tomorrow.   Physical Exam:  Filed Vitals:   10/04/11 0800 10/04/11 0900 10/04/11 1129 10/04/11 1236  BP: 94/70 110/69 110/69 111/81  Pulse:   76 70  Temp:  97.8 F (36.6 C)  97.9 F (36.6 C)  TempSrc:  Oral  Oral  Resp: 17  18 18   Height:      Weight:      SpO2: 92%  97% 95%   Gen: Pleasant, obese, in no distress,  normal affect ENT: No lesions,  mouth clear Neck: No JVD, no carotid bruits, fat neck w/ trache- thick white sputum/ good phonation w/ occlusion of trach Lungs: No use of accessory muscles, clear, decreased in bases Cardiovascular: RRR, heart sounds normal, no murmur or gallops, no peripheral edema Abdomen:  Obese, soft and NT, no HSM, BS normal Musculoskeletal: No deformities, no cyanosis or clubbing Neuro: alert, cooperative, weak in bilat LEs Skin: Warm, no lesions or rashes  Labs  Lab 10/01/11 0705 09/30/11 1050 09/30/11 0259 09/30/11 0245  HGB  12.5* 12.5* 14.3 --  HCT 37.5* 38.5* 42.0 --  WBC 6.3 7.6 -- 9.8  PLT 142* 151 -- 172    Lab 10/01/11 0705 09/30/11 1050 09/30/11 0259 09/30/11 0245  NA 140 -- 143 141  K 3.8 -- 4.2 --  CL 100 -- 105 101  CO2 32 -- -- 28  GLUCOSE 134* -- 124* 124*  BUN 18 -- 34* 31*  CREATININE 1.18 1.55* 1.90* 1.79*  CALCIUM 9.2 -- -- 9.8  MG -- -- -- --  PHOS -- -- -- --    Lab 10/03/11 0505 10/02/11 0610 10/01/11 0705 09/30/11 0245  INR 2.12* 1.86* 2.13* 1.81*   Imaging results:   Assessment & Plan   PULM DIAGNOSES: Morbid Obesity OSA/ OHS w/ permanent trach & on nocturnal vent Underlying COPD 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  Recommendation -Trach care, TCollar during the day, monitor O2 sats carefully for supplemental O2, nocturnal vent at night. He has home vent, can use his baseline settings.  -NEBS w/ Albut & Ipratrop QID -Trach suctioning  -Mucinex 1200mg  Bid  OTHER PROBLEMS per primary: Adm w/ LE weakness ?etiology (staff note: per NP patient has relayed this might be knee pain) Recent c/o LBP- seen by DrEllison 9/3 Pt on chronic narcotic analgesics for chr pain IDDM Renal insuffic HBP Diastolic dysfunction ==>  check BNP, & gentle diuresis as tolerated.   Levy Pupa, MD, PhD 10/04/2011, 2:11 PM [facility] [phone] or if no answer [phone]

## 2011-10-04 NOTE — Progress Notes (Signed)
Physical Therapy Treatment Patient Details Name: Travis Chapman MRN: [id number] DOB: 1945-08-26 Today's Date: 10/04/2011 Time: 1914-7829 PT Time Calculation (min): 23 min  PT Assessment / Plan / Recommendation Comments on Treatment Session  Pt with improved mobility compared to last PT session but still requiring physical assist to achieve standing & min guard for ambulation.  Pt relates difficulty mobilizing to bil knee pain.      Follow Up Recommendations  Home health PT    Barriers to Discharge        Equipment Recommendations  None recommended by OT    Recommendations for Other Services    Frequency Min 3X/week   Plan Discharge plan remains appropriate    Precautions / Restrictions Precautions Precautions: Fall    Pertinent Vitals/Pain Reports bil knee pain.  Not rated.  RN notified for pain medication    Mobility  Bed Mobility Bed Mobility: Not assessed Transfers Transfers: Sit to Stand;Stand to Sit Sit to Stand: 4: Min assist;With upper extremity assist;With armrests;From chair/3-in-1 Stand to Sit: 4: Min guard;With upper extremity assist;With armrests;To chair/3-in-1 Details for Transfer Assistance: Assist to achieve standing, anterior weight shifting forwards.  Required decreased assist to stand since last PT session Ambulation/Gait Ambulation/Gait Assistance: 4: Min guard Ambulation Distance (Feet): 100 Feet Assistive device: Rolling walker Ambulation/Gait Assistance Details: cues for upright posture, stay closer to RW.  Pt with small steps, decreased floor clearance, & heavy reliance on RW with UE's.   Gait Pattern: Step-through pattern;Decreased stride length;Trunk flexed (decreased floor clearance.  ) Gait velocity: decreased General Gait Details: Pt reports this is not his baseline & that pain in bil knees is limiting him Stairs: No Wheelchair Mobility Wheelchair Mobility: No      PT Goals Acute Rehab PT Goals Time For Goal Achievement:  10/14/11 Potential to Achieve Goals: Good PT Goal: Sit to Stand - Progress: Progressing toward goal PT Goal: Stand to Sit - Progress: Progressing toward goal PT Goal: Ambulate - Progress: Progressing toward goal  Visit Information  Last PT Received On: 10/04/11 Assistance Needed: +2 (safety to follow with recliner)    Subjective Data      Cognition  Overall Cognitive Status: Appears within functional limits for tasks assessed/performed Arousal/Alertness: Awake/alert Orientation Level: Appears intact for tasks assessed Behavior During Session: [facility] for tasks performed    Balance     End of Session PT - End of Session Equipment Utilized During Treatment: Gait belt Activity Tolerance: Patient tolerated treatment well;Patient limited by pain Patient left: in chair;with call bell/phone within reach Nurse Communication: Mobility status     Verdell Face, [place] [phone] 10/04/2011

## 2011-10-05 LAB — PROTIME-INR: INR: 2.15 — ABNORMAL HIGH (ref 0.00–1.49)

## 2011-10-05 MED ORDER — FUROSEMIDE 40 MG PO TABS: 40.0000 mg | ORAL_TABLET | Freq: Every day | ORAL | Status: DC

## 2011-10-05 MED ORDER — ACETAMINOPHEN 500 MG PO TABS: 500.0000 mg | ORAL_TABLET | Freq: Four times a day (QID) | ORAL | Status: DC | PRN

## 2011-10-05 MED ORDER — HYDROCODONE-ACETAMINOPHEN 5-325 MG PO TABS: 1.0000 | ORAL_TABLET | ORAL | Status: DC | PRN

## 2011-10-05 MED ORDER — POLYETHYLENE GLYCOL 3350 17 G PO PACK: 17.0000 g | PACK | Freq: Two times a day (BID) | ORAL | Status: DC

## 2011-10-05 NOTE — Progress Notes (Signed)
Physical Therapy Treatment Patient Details Name: Jarl Sellitto MRN: [id number] DOB: December 07, 1945 Today's Date: 10/05/2011 Time: 7829-5621 PT Time Calculation (min): 20 min  PT Assessment / Plan / Recommendation Comments on Treatment Session  Cont's to make steady progress with mobilty but still not at mod I or (S) level.  Pt states that he has church family in neighborhood that can check in on him.      Follow Up Recommendations  Home health PT    Barriers to Discharge        Equipment Recommendations  None recommended by OT    Recommendations for Other Services    Frequency Min 3X/week   Plan Discharge plan remains appropriate    Precautions / Restrictions Precautions Precautions: Fall Restrictions Weight Bearing Restrictions: No    Pertinent Vitals/Pain C/o mild knee pain but did not rate.  RN states pt had pain medication earlier in AM.     Mobility  Bed Mobility Bed Mobility: Not assessed Details for Bed Mobility Assistance: Pt deferred bed mobility today reporting he does well with it.   Transfers Transfers: Sit to Stand;Stand to Sit Sit to Stand: 4: Min assist;With upper extremity assist;With armrests;From chair/3-in-1 Stand to Sit: 4: Min guard;With upper extremity assist;With armrests;To chair/3-in-1 Details for Transfer Assistance: Performed 6x's.  1st trial pt required mod assist to achieve standing but then progressed to min assist>min guard with remaining trials.  Pt states that chairs at home are higher than recliner here at hospital.   Ambulation/Gait Ambulation/Gait Assistance: 4: Min guard Ambulation Distance (Feet): 120 Feet Assistive device: Rolling walker Ambulation/Gait Assistance Details: Cont's to require cues for tall posture especially as he begins to fatigue.  Pt with semi flexed posture, shuffle steps, & heavy reliance of UE's on RW.  Pt reports distance that he has ambulated is sufficient enough to get him around his house.   Gait Pattern:  Step-through pattern;Decreased stride length;Decreased hip/knee flexion - right;Decreased hip/knee flexion - left;Shuffle;Trunk flexed;Wide base of support Gait velocity: decreased Stairs: No Wheelchair Mobility Wheelchair Mobility: No      PT Goals Acute Rehab PT Goals Time For Goal Achievement: 10/14/11 Potential to Achieve Goals: Good PT Goal: Sit to Stand - Progress: Progressing toward goal PT Goal: Stand to Sit - Progress: Progressing toward goal PT Goal: Ambulate - Progress: Progressing toward goal  Visit Information  Last PT Received On: 10/05/11 Assistance Needed: +1    Subjective Data      Cognition  Overall Cognitive Status: Appears within functional limits for tasks assessed/performed Arousal/Alertness: Awake/alert Orientation Level: Appears intact for tasks assessed Behavior During Session: [facility] for tasks performed    Balance     End of Session PT - End of Session Equipment Utilized During Treatment: Gait belt Activity Tolerance: Patient tolerated treatment well Patient left: in chair;with call bell/phone within reach Nurse Communication: Mobility status     Verdell Face, [place] [phone] 10/05/2011

## 2011-10-05 NOTE — Progress Notes (Signed)
ANTICOAGULATION CONSULT NOTE - Follow up Consult  Pharmacy Consult for Warfarin  Indication: VTE prophylaxis, h/o PE   Labs:  [facility] 10/05/11 0828 10/03/11 0505  HGB -- --  HCT -- --  PLT -- --  APTT -- --  LABPROT 23.1* 24.1*  INR 2.15* 2.12*  HEPARINUNFRC -- --  CREATININE -- --  CKTOTAL -- --  CKMB -- --  TROPONINI -- --    Estimated Creatinine Clearance: 100.7 ml/min (by C-G formula based on Cr of 1.18).     Assessment: Pt is a 88 YOM with significant PMH who is admitted for LE weakness. 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 INR this am is 2.15 which is at goal. Warfarin home regimen: 10mg  on MWF 5mg  on Tues/Thurs/Sat/Sun  Goal of Therapy:  INR 2-3 Monitor platelets by anticoagulation protocol: Yes   Plan:  - Continue home coumadin 10 mg MWF, 5 mg TRSS.   - Check PT/INR on Tues/Thurs/Sat - Monitor CBC, signs/sx of bleeding

## 2011-10-05 NOTE — Discharge Summary (Signed)
DISCHARGE SUMMARY  Travis Chapman  MR#: [id number]  DOB:[birth year]  Date of Admission: 09/30/2011 Date of Discharge: 10/05/2011  Attending Physician:Croy Drumwright Butler Denmark, MD  Patient's JYN:WGNFAOZ, [name], MD  Consults: PCCM/Dr. Delton Coombes                   Neurosurgery/Dr. Jenkins-telephone encounter  Pertinent discharge information: Primary rationale for admission was due to leg weakness and falling. No acute neurological changes on exam. No acute findings on back CT that would warrant surgical intervention during this admission. Patient offered option of short-term skilled nursing facility for rehabilitation versus discharge to home and he chose to return home with physical therapy after discharge.  Discharge Diagnoses: Principal Problem:  *Bilateral Lower extremity weakness-progressive do to multilevel spinal stenosis and chronic bilateral knee pain (likley arthritis) Active Problems:  HYPERTENSION  HYPOVENTILATION  Pulmonary embolism  Diastolic HF (heart failure)  OSA (obstructive sleep apnea)  Tracheostomy dependent  Obesities, morbid  Renal insufficiency  Chronic respiratory failure   Radiology: Dg Lumbar Spine Complete  09/21/2011  *RADIOLOGY REPORT*  Clinical Data: Low back pain for 3 weeks.  History of prostate cancer.  LUMBAR SPINE - COMPLETE 4+ VIEW  Comparison: Whole body bone scan 01/04/2009.  Findings: There is no fracture or subluxation of the lumbar spine. The patient has marked degenerative disc disease and facet arthropathy from L3-S1.  Paraspinous structures demonstrate atherosclerotic vascular disease.  IMPRESSION: Marked lower lumbar spondylosis.   Original Report Authenticated By: Bernadene Bell. Maricela Curet, M.D.    Ct Lumbar Spine Wo Contrast  09/30/2011  *RADIOLOGY REPORT*  Clinical Data: Bilateral lower extremity weakness.  Difficulty walking.  The  CT LUMBAR SPINE WITHOUT CONTRAST  Technique:  Multidetector CT imaging of the lumbar spine was performed without intravenous contrast  administration. Multiplanar CT image reconstructions were also generated.  Comparison: Radiography 09/21/2011  Findings: Detail is poor due to the patient's size.  There is thoracolumbar curvature convex to the right and lower lumbar curvature convex to the left.  T11-12:  Unremarkable interspace.  Mild facet degeneration but no stenosis.  T12-L1:  Unremarkable interspace.  Mild facet degeneration.  L1-2:  Minimal bulging of the disc.  Mild facet degeneration.  No stenosis.  L2-3:  Mild bulging of the disc.  Mild facet and ligamentous hypertrophy.  No significant stenosis.  L3-4:  Circumferential bulging of the disc.  Mild facet and ligamentous prominence.  Mild stenosis of the lateral recesses and foramina.  L4-5:  There is solid bridging osteophytes that appear to result in functional fusion.  There is facet hypertrophy bilaterally.  There is narrowing of the lateral recesses.  There is foraminal narrowing right more than left.  L5-S1:  There are prominent bridging osteophytes but I cannot determine if these are solid.  There is bilateral facet arthropathy.  There is bulging of the disc.  There is narrowing of the subarticular lateral recesses.  Neural foraminal narrowing is present bilaterally that could effect either L5 nerve root.  There is osteoarthritis of both sacroiliac joints that could be symptomatic.  IMPRESSION: Mild spinal curvature.  No likely compressive pathology at L2-3 or above.  The patient does have some diffuse facet degeneration which could contribute to back pain.  L3-4:  Disc bulge and facet hypertrophy.  Mild narrowing of the lateral recesses and foramina.  L4-5:  Prominent bridging osteophytes which appear to result in solid fusion.  Facet arthropathy.  Narrowing of the lateral recesses and foramina, right worse than left.  Foraminal stenosis could be significant.  L5-S1:  Bulging of the disc.  Circumferential osteophytes without definite solid fusion.  Bilateral facet arthropathy.   Narrowing of the subarticular lateral recesses and neural foramina that could cause neural compression.  Bilateral sacroiliac osteoarthritis that could be symptomatic.   Original Report Authenticated By: Thomasenia Sales, M.D.    Dg Chest Port 1 View  09/30/2011  *RADIOLOGY REPORT*  Clinical Data: Weakness, history of asthma and CHF.  PORTABLE CHEST - 1 VIEW  Comparison: 03/10/2011  Findings: Cardiomegaly.  Central vascular congestion.  Tracheostomy tube.  Peribronchial thickening and interstitial prominence. Bibasilar opacities.  Cannot exclude pleural effusions.  No pneumothorax.  Due to technique and patient body habitus, unable to evaluate the spine.  IMPRESSION: Cardiomegaly with suggestion of pulmonary edema.  Retrocardiac space is obscured.  Cannot exclude effusion or consolidation.   Original Report Authenticated By: Waneta Martins, M.D.     Laboratory: Results for orders placed during the hospital encounter of 09/30/11 (from the past 48 hour(s))  GLUCOSE, CAPILLARY     Status: Abnormal   Collection Time   10/03/11 12:04 PM      Component Value Range Comment   Glucose-Capillary 156 (*) 70 - 99 mg/dL   GLUCOSE, CAPILLARY     Status: Abnormal   Collection Time   10/03/11  4:50 PM      Component Value Range Comment   Glucose-Capillary 193 (*) 70 - 99 mg/dL   GLUCOSE, CAPILLARY     Status: Abnormal   Collection Time   10/03/11  9:06 PM      Component Value Range Comment   Glucose-Capillary 178 (*) 70 - 99 mg/dL   GLUCOSE, CAPILLARY     Status: Abnormal   Collection Time   10/04/11  7:39 AM      Component Value Range Comment   Glucose-Capillary 118 (*) 70 - 99 mg/dL    Comment 1 Notify RN     GLUCOSE, CAPILLARY     Status: Abnormal   Collection Time   10/04/11 12:01 PM      Component Value Range Comment   Glucose-Capillary 161 (*) 70 - 99 mg/dL    Comment 1 Notify RN     GLUCOSE, CAPILLARY     Status: Abnormal   Collection Time   10/04/11  4:37 PM      Component Value Range  Comment   Glucose-Capillary 146 (*) 70 - 99 mg/dL    Comment 1 Notify RN     GLUCOSE, CAPILLARY     Status: Abnormal   Collection Time   10/04/11  9:54 PM      Component Value Range Comment   Glucose-Capillary 228 (*) 70 - 99 mg/dL    Comment 1 Documented in Chart      Comment 2 Notify RN     GLUCOSE, CAPILLARY     Status: Abnormal   Collection Time   10/05/11  7:37 AM      Component Value Range Comment   Glucose-Capillary 130 (*) 70 - 99 mg/dL    Comment 1 Notify RN     PROTIME-INR     Status: Abnormal   Collection Time   10/05/11  8:28 AM      Component Value Range Comment   Prothrombin Time 23.1 (*) 11.6 - 15.2 seconds    INR 2.15 (*) 0.00 - 1.49        Medication List     As of 10/05/2011 10:32 AM    STOP taking these medications  ALPRAZolam 3 MG 24 hr tablet   Commonly known as: XANAX XR      TAKE these medications         acetaminophen 500 MG tablet   Commonly known as: TYLENOL   Take 1 tablet (500 mg total) by mouth every 6 (six) hours as needed for pain.      alprazolam 2 MG tablet   Commonly known as: XANAX   Take 1 tablet (2 mg total) by mouth at bedtime as needed for sleep.      amLODipine 10 MG tablet   Commonly known as: NORVASC   Take 10 mg by mouth daily.      BENICAR 40 MG tablet   Generic drug: olmesartan   TAKE ONE TABLET BY MOUTH EVERY DAY      carvedilol 6.25 MG tablet   Commonly known as: COREG   Take 1 tablet (6.25 mg total) by mouth 2 (two) times daily with a meal.      cloNIDine 0.1 MG tablet   Commonly known as: CATAPRES   TAKE ONE TABLET BY MOUTH TWICE DAILY      furosemide 40 MG tablet   Commonly known as: LASIX   Take 1 tablet (40 mg total) by mouth daily.      glucose blood test strip   Use as instructed twice daily      HYDROcodone-acetaminophen 5-325 MG per tablet   Commonly known as: NORCO/VICODIN   Take 1-2 tablets by mouth every 4 (four) hours as needed.      insulin glargine 100 UNIT/ML injection    Commonly known as: LANTUS   Inject 25 Units into the skin every morning.      metoCLOPramide 5 MG tablet   Commonly known as: REGLAN   TAKE ONE TABLET BY MOUTH THREE TIMES DAILY      multivitamin tablet   Take 1 tablet by mouth daily.      polyethylene glycol packet   Commonly known as: MIRALAX / GLYCOLAX   Take 17 g by mouth 2 (two) times daily.      potassium chloride SA 20 MEQ tablet   Commonly known as: K-DUR,KLOR-CON   Take 2 tablets (40 mEq total) by mouth daily.      RELION INSULIN SYR 1CC/30G 30G X 5/16" 1 ML Misc   Generic drug: Insulin Syringe-Needle U-100   USE AS DIRECTED      simvastatin 20 MG tablet   Commonly known as: ZOCOR   TAKE ONE TABLET BY MOUTH AT BEDTIME      warfarin 10 MG tablet   Commonly known as: COUMADIN   Take 5-10 mg by mouth daily. Take 1 tablet on Monday, Wednesday and Friday then take 1/2 tablet all other days          History of present illness: Patient is a 66 year old male with a history of obstructive sleep apnea, CHF, chronic tracheostomy in place, ventilator dependent nocturnally who presents to the hospital for severe weakness with the inability to ambulate. The patient reported that at baseline he does have weakness in his lower extremities-he usually walks with a help of a walker-per patient's daughter, patient has very poor mobility and either lays in bed or sits in a chair most of the day- he presented to the hospital when he fell- his "legs gave away" and he has had increased difficulty with ambulation after the fall. Because of need for chronic ventilatory support via his trach he was admitted to the step  down unit for further monitoring and treatment.    Hospital Course: Principal Problem:  *Lower extremity weakness Active Problems:  HYPERTENSION  HYPOVENTILATION  Pulmonary embolism  Diastolic HF (heart failure)  OSA (obstructive sleep apnea)  Tracheostomy dependent  Obesities, morbid  Renal insufficiency  Chronic  respiratory failure  Lower extremity weakness Radiology felt to be multifactorial related to bilateral knee pain and weakness as well as concern for nerve root compression due to multilevel spinal stenosis.  Neurosurgical consultation was requested. Dr. Butler Denmark did speak to Dr. Lovell Sheehan and explained patient's presentation with progressive weakness and fall. Dr. Lovell Sheehan reviewed the CT scan and states that the patient did not need and inpatient evaluation and further recommended that the patient follow-up in the office with him for possible spinal injections. In addition, Dr.Hagar Sadiq questioned Dr. Lovell Sheehan as to whether oral prednisone taper would be appropriate. He felt that at this time steroid therapy was not indicated.  The patient's arthritic pain was managed with a combination of exercise, Tylenol and  Vicodin - he will be prescribed a short-term course of Vicodin at discharge. It was explained to the patient that it is preferable that he takes the extra strength Tylenol as opposed to narcotics. He was also instructed that while taking the Vicodin not to take Tylenol at the same time since Vicodin contains Tylenol. He was instructed to use stool softeners for any constipation resulting from the Vicodin.  Physical therapy continued to work with the patient during the hospitalization and recommended short-term rehabilitation. Patient was offered the option of proceeding to a local skilled nursing facility for short-term rehabilitation but he declined stating he preferred to go home. He will continue home health physical therapy after discharge. He declined occupational therapy at home Chronic deconditioning As noted in the history of present illness at admission patient leads a very sedentary lifestyle certainly influence was underlying obesity and his obesity hypoventilation syndrome. As noted above he will continue physical therapy not only to assist with the issues with leg pain and weakness affecting  mobility but also to help with reconditioning  History of Pulmonary embolism  At presentation INR was slightly subtherapeutic. Patient endorses not missing any doses of warfarin. He did require temporary bridging with heparin during the hospitalization. INR on date of discharge was 2.15.   Acute on chronic renal failure  Lasix was held initially due to acute renal failure at presentation. Peak creatinine was 1.9 with a BUN of 34. A slightly Lasix has been resumed but only once daily instead of twice daily regimen. Last renal function panel was obtained on 10/01/2011 BUN was 18 creatinine 1.18.   Diastolic HF (heart failure)  Remained compensated during the hospitalization. He will continue Lasix and Coreg with Lasix dosage adjusted as noted above.   Chronic tracheostomy and chronic nocturnal ventilator dependence due to obesity hypoventilation syndrome Pulmonary critical care medicine assisted with chronic ventilator management. Patient remained stable on daytime trach collar and full ventilatory support at hour sleep. His nocturnal ventilator settings are as follows: SIMV mode, rate of 6, tidal volume 700, PEEP 5, FiO2 28%, peak airway pressure 19   HYPERTENSION  Has remained stable this hospitalization and has not required adjustments in medications   Day of Discharge BP 145/89  Pulse 83  Temp 98.1 F (36.7 C) (Oral)  Resp 18  Ht 6\' 2"  (1.88 m)  Wt 162 kg (357 lb 2.3 oz)  BMI 45.85 kg/m2  SpO2 94%  Physical Exam: General: Obese, no acute distress,  alert oriented. Has a trach with small amount of pale yellow mucous draining  Cardiovascular: RRR, no murmurs  Respiratory: CTA b/l  Abdomen: soft, NT, ND BS+  Ext: no c/c/e  Neuro- 4/5 strength b/l lower extremities, 5/5 in b/l upper extremities, CN 2-12 intact.   Follow-up: Call to arrange routine hospital followup appointment with your primary care physician Dr. Romero Belling  Call neurosurgeon Dr. Lovell Sheehan to arrange for an  initial patient consultation regarding your back pain and leg weakness.  Disposition:  Discharge to home with continued home health support for management of ventilator as well as new physical therapy followup.   Junious Silk, ANP pager [phone]   I have examined the patient and discussed discharge plans. I have reviewed his chart, modified the above note and agree with above discharge summary.   Calvert Cantor, MD

## 2011-10-05 NOTE — Progress Notes (Signed)
Pt given discharge instructions and patient verbalizes understanding of instructions.  Pt vital signs within normal limits and pt had no complaints of pain.  Pt escorted out of building via wheelchair to private vehicle.

## 2011-10-19 ENCOUNTER — Other Ambulatory Visit: Payer: Self-pay | Admitting: Endocrinology

## 2011-10-20 NOTE — Telephone Encounter (Signed)
Pt last seen on 09/21/11. Xanax last filled on 09/19/11. Coreg last filled on 04/18/11.

## 2011-11-03 ENCOUNTER — Other Ambulatory Visit (INDEPENDENT_AMBULATORY_CARE_PROVIDER_SITE_OTHER): Payer: Medicare Other | Admitting: Endocrinology

## 2011-11-03 DIAGNOSIS — R29898 Other symptoms and signs involving the musculoskeletal system: Secondary | ICD-10-CM

## 2011-11-08 ENCOUNTER — Encounter: Payer: Self-pay | Admitting: Endocrinology

## 2011-11-08 ENCOUNTER — Ambulatory Visit (INDEPENDENT_AMBULATORY_CARE_PROVIDER_SITE_OTHER): Payer: Medicare Other | Admitting: Endocrinology

## 2011-11-08 VITALS — BP 126/76 | HR 69 | Temp 98.4°F | Resp 16

## 2011-11-08 DIAGNOSIS — M545 Low back pain: Secondary | ICD-10-CM

## 2011-11-08 MED ORDER — OXYCODONE HCL 15 MG PO TABS: 15.0000 mg | ORAL_TABLET | ORAL | Status: DC | PRN

## 2011-11-08 NOTE — Progress Notes (Signed)
Subjective:    Patient ID: Travis Chapman, male    DOB: 1945/07/17, 66 y.o.   MRN: [id number]  HPI The state of at least three ongoing medical problems is addressed today: Pt was recently hospitalized for a fall, due to leg weakness.  He says this happened despite using his walker.  He feels better now. Insomnia: he says xanax does not help. Chronic pain syndrome, worst at the back and both knees: he says pain meds don't help enough.  He says neurosurg told him yesderday there was nothing further they could do for him.   Past Medical History  Diagnosis Date  . ANXIETY 07/23/2006  . ASTHMA 07/23/2006  . DM 05/23/2007  . HYPERTENSION 07/23/2006  . OSTEOARTHRITIS 07/23/2006  . HYPERCHOLESTEROLEMIA 05/23/2007  . ANEMIA 08/31/2008  . Palpitations 02/10/2008  . ALCOHOL ABUSE, IN REMISSION, HX OF 08/26/2008  . TOBACCO ABUSE, HX OF 08/26/2008  . Pulmonary embolism 03/16/2010  . ED (erectile dysfunction)   . Morbid obesity   . DM retinopathy   . 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 DM neuropathy with neurologic complication   . CHF (congestive heart failure)     With a preserved EF  . 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 Sleep apnea   . Shortness of breath   . CARCINOMA, PROSTATE 09/29/2009    Past Surgical History  Procedure Date  . Back surgery   . Tracheostomy     History   Social History  . Marital Status: Divorced    Spouse Name: N/A    Number of Children: N/A  . Years of Education: N/A   Occupational History  . Disabled    Social History Main Topics  . Smoking status: Former Smoker    Quit date: 01/16/2005  . Smokeless tobacco: Not on file  . Alcohol Use: No  . Drug Use: Not on file  . Sexually Active: Not on file   Other Topics Concern  . Not on file   Social History Narrative   SingleLives with his elderly mother    Current Outpatient Prescriptions on File Prior to Visit  Medication  Sig Dispense Refill  . acetaminophen (TYLENOL) 500 MG tablet Take 1 tablet (500 mg total) by mouth every 6 (six) hours as needed for pain.  30 tablet  0  . ALPRAZolam (XANAX XR) 3 MG 24 hr tablet TAKE ONE TABLET BY MOUTH IN THE MORNING  30 tablet  5  . amLODipine (NORVASC) 10 MG tablet Take 10 mg by mouth daily.      Marland Kitchen BENICAR 40 MG tablet TAKE ONE TABLET BY MOUTH EVERY DAY  90 each  1  . carvedilol (COREG) 6.25 MG tablet TAKE ONE TABLET BY MOUTH TWICE DAILY WITH MEALS  60 tablet  4  . cloNIDine (CATAPRES) 0.1 MG tablet TAKE ONE TABLET BY MOUTH TWICE DAILY  60 tablet  4  . furosemide (LASIX) 40 MG tablet Take 1 tablet (40 mg total) by mouth daily.  30 tablet  0  . glucose blood (ONE TOUCH ULTRA TEST) test strip Use as instructed twice daily  100 each  3  . HYDROcodone-acetaminophen (NORCO/VICODIN) 5-325 MG per tablet Take 1-2 tablets by mouth every 4 (four) hours as needed.  30 tablet  0  . insulin glargine (LANTUS) 100 UNIT/ML injection Inject 25 Units into the skin every morning.       Marland Kitchen  metoCLOPramide (REGLAN) 5 MG tablet TAKE ONE TABLET BY MOUTH THREE TIMES DAILY  90 tablet  5  . Multiple Vitamin (MULTIVITAMIN) tablet Take 1 tablet by mouth daily.        . polyethylene glycol (MIRALAX / GLYCOLAX) packet Take 17 g by mouth 2 (two) times daily.  14 each    . potassium chloride SA (K-DUR,KLOR-CON) 20 MEQ tablet Take 2 tablets (40 mEq total) by mouth daily.  60 tablet  5  . RELION INSULIN SYR 1CC/30G 30G X 5/16" 1 ML MISC USE AS DIRECTED  100 each  3  . simvastatin (ZOCOR) 20 MG tablet TAKE ONE TABLET BY MOUTH AT BEDTIME  30 tablet  5  . warfarin (COUMADIN) 10 MG tablet Take 5-10 mg by mouth daily. Take 1 tablet on Monday, Wednesday and Friday then take 1/2 tablet all other days        No Known Allergies  Family History  Problem Relation Age of Onset  . Cancer Neg Hx     BP 126/76  Pulse 69  Temp 98.4 F (36.9 C) (Oral)  Resp 16  SpO2 97%  Review of Systems Denies SOB and LOC.       Objective:   Physical Exam VITAL SIGNS:  See vs page GENERAL: no distress.  In wheelchair.  Morbid obesity. Right knee: normal to my exam.       Assessment & Plan:  Episode of generalized weakness, ? Due to meds Insomnia.  It is not safe to increase rx Chronic pain syndrome.  It is not safe to increase meds

## 2011-11-08 NOTE — Patient Instructions (Addendum)
Refer to a PMR specialist.  you will receive a phone call, about a day and time for an appointment.   It is not safe to increase your pain or sleep medications.

## 2011-11-10 ENCOUNTER — Telehealth: Payer: Self-pay | Admitting: General Practice

## 2011-11-10 ENCOUNTER — Telehealth: Payer: Self-pay

## 2011-11-10 DIAGNOSIS — M25569 Pain in unspecified knee: Secondary | ICD-10-CM

## 2011-11-10 NOTE — Telephone Encounter (Signed)
ok 

## 2011-11-10 NOTE — Telephone Encounter (Signed)
Per Corrie Dandy, [facility] you need to fix the orders stated on the last AVS. Referral now states Pediatric Physical Medicine Rehab. Please create a new referral. Thanks.

## 2011-11-10 NOTE — Telephone Encounter (Signed)
Is this ok?

## 2011-11-10 NOTE — Telephone Encounter (Signed)
done

## 2011-11-10 NOTE — Telephone Encounter (Signed)
Earnest Rosier, physical therapist for [facility] is requesting a verbal order for two more weeks of therapy with [name].  You can call her at [phone] to give verbal and she will send written order.

## 2011-11-10 NOTE — Telephone Encounter (Signed)
Nurse notified and advised to send fax.

## 2011-11-14 ENCOUNTER — Telehealth: Payer: Self-pay | Admitting: Endocrinology

## 2011-11-14 NOTE — Telephone Encounter (Signed)
Was done 11/10/11.

## 2011-11-14 NOTE — Telephone Encounter (Signed)
Pt called to check on referred to physical therapy. He hasn't heard anything about an appointment.

## 2011-11-14 NOTE — Telephone Encounter (Signed)
Please advise 

## 2011-11-16 ENCOUNTER — Telehealth: Payer: Self-pay

## 2011-11-16 ENCOUNTER — Other Ambulatory Visit: Payer: Self-pay | Admitting: Endocrinology

## 2011-11-16 NOTE — Telephone Encounter (Signed)
Pt advised referral had been requested

## 2011-11-16 NOTE — Telephone Encounter (Signed)
Pt called requesting a return call regarding medications.

## 2011-11-21 ENCOUNTER — Telehealth: Payer: Self-pay | Admitting: Endocrinology

## 2011-11-21 DIAGNOSIS — J9610 Chronic respiratory failure, unspecified whether with hypoxia or hypercapnia: Secondary | ICD-10-CM

## 2011-11-21 MED ORDER — INSULIN GLARGINE 100 UNIT/ML ~~LOC~~ SOLN: 25.0000 [IU] | Freq: Every morning | SUBCUTANEOUS | Status: DC

## 2011-11-21 NOTE — Telephone Encounter (Signed)
Pt calling wanting to know about the appt for the pain clinic? He would like someone to call him.

## 2011-11-21 NOTE — Telephone Encounter (Signed)
Pt calling wanting to know about the appt for the pain clinic? Please advise.

## 2011-11-21 NOTE — Telephone Encounter (Signed)
Per referral note on 11/10/11 from April Marshall at [facility] to Shelbie Proctor: Thank you for the referral, however, Dr. [name] Banker has reviewed records and feels we do not have anything to offer this patient. He suggests patient may need palliative care. Thank you again. Pt needs to be consulted concerning this and an order needs to be submitted for palliative care.

## 2011-11-21 NOTE — Telephone Encounter (Signed)
Please direct this call to [facility] 

## 2011-11-21 NOTE — Telephone Encounter (Signed)
i redirected consult request to palliative care

## 2011-11-23 ENCOUNTER — Telehealth: Payer: Self-pay | Admitting: *Deleted

## 2011-11-23 ENCOUNTER — Telehealth: Payer: Self-pay | Admitting: Endocrinology

## 2011-11-23 NOTE — Telephone Encounter (Signed)
Consult to palliative care was done 11/21/11

## 2011-11-23 NOTE — Telephone Encounter (Signed)
Caller: [name]/Patient; Patient Name: Travis Chapman; PCP: Romero Belling (Adults only); Best Callback Phone Number: [phone] Calling regarding the pain clinic,  he called [facility] and they are accepting new pts. He is out of oxycodone, was given 15 tablets on 11/08/11. PLEASE CALL REGARDING THIS MATTER, thanks. Declined triage.

## 2011-11-23 NOTE — Telephone Encounter (Signed)
Pt's caretaker called stating they have found a pain management practice that will take him, if you will do a referral for him. [facility] at [phone].

## 2011-11-23 NOTE — Telephone Encounter (Signed)
Tried to call pt, no answer (9:37am), will call back again.

## 2011-11-27 ENCOUNTER — Other Ambulatory Visit: Payer: Self-pay | Admitting: *Deleted

## 2011-11-27 MED ORDER — INSULIN GLARGINE 100 UNIT/ML ~~LOC~~ SOLN: 25.0000 [IU] | Freq: Every morning | SUBCUTANEOUS | Status: DC

## 2011-12-07 ENCOUNTER — Other Ambulatory Visit: Payer: Self-pay | Admitting: Endocrinology

## 2011-12-07 ENCOUNTER — Telehealth: Payer: Self-pay | Admitting: *Deleted

## 2011-12-07 NOTE — Telephone Encounter (Signed)
Pt is requesting refill of pain medication since he was unable to be seen by pain management-please advise.

## 2011-12-08 NOTE — Telephone Encounter (Signed)
Pt was ref to palliative care.  What is the states of this referral?

## 2011-12-08 NOTE — Telephone Encounter (Signed)
NO ANSWER AT PATIENT HOME # TO ASK IF PATIENT WENT TO PALLIATIVE CARE. PATIENT HAD CALLED ON 11/23/2011 REQEUST REFERRAL TO [facility]. REFERRAL PUT IN BY [facility], ON 12/04/2011 TO PHYSICAL MEDICINE REHAB. PER STATEMENT IN EPIC REFERRAL, DR. MARK PHILLIPS OFFICE HAS NOTING TO OFFER PATIENT. STATES PATIENT NOTIFIED OF THIS. PLEASE ADVISE ON RX FOR PAIN MED. SUE

## 2011-12-08 NOTE — Telephone Encounter (Signed)
Palliative care consul was recommended.  Has he had the consult?

## 2011-12-08 NOTE — Telephone Encounter (Signed)
Had pt gone for ref to palliative care?

## 2011-12-08 NOTE — Telephone Encounter (Signed)
Patient stated that the palliative care doctor stated he could not help him. They only helped critically ill patients. Patient stated he is in severe pain and was only given 15 pills of pain medication oxycodone last month and has been out . Please advise.

## 2011-12-11 ENCOUNTER — Telehealth: Payer: Self-pay | Admitting: *Deleted

## 2011-12-11 NOTE — Telephone Encounter (Signed)
i need to see the report from the palliative care consult.

## 2011-12-11 NOTE — Telephone Encounter (Signed)
PATIENT STATES HE DID NOT GO TO PALLIATIVE CARE BECAUSE HE WAS TOLD THEY WILL ONLY TAKE TERMINALLY ILL PATIENTS. I HAVE A CALL IN TO [facility] TO SEE WHAT THEY SAY. PATIENT IN PAIN.

## 2011-12-12 NOTE — Telephone Encounter (Signed)
please call patient: He needs to see palliative care

## 2011-12-12 NOTE — Telephone Encounter (Signed)
[facility] CALLED. STATE THEY DO NOT SEE PATIENTS IN THE HOME FOR PALLIATIVE CARE. SUGGESTED  I CALL [facility]. THEY DO PALLIATIVE CARE IN THE  HOME. I CALLED THEM AND WILL REVIEW THE NOTES IN THE PATIENT CHART IF THEY CAN HELP WITH THIS AND LET us KNOW. PATIENT AWARE OF THIS. SUE

## 2011-12-13 ENCOUNTER — Telehealth: Payer: Self-pay

## 2011-12-13 DIAGNOSIS — M545 Low back pain: Secondary | ICD-10-CM

## 2011-12-13 NOTE — Telephone Encounter (Signed)
Misty with Palliative care called [phone] ext [phone]) they can not provide services for patient for chronic back pain, pt would need a referral to the pain clinic,but Misty states that pt was told back Dr. Lin Givens back pain is not the cause of his problem. Pleas advise

## 2011-12-18 NOTE — Telephone Encounter (Signed)
please call patient: Which are you taking: hydrocodone or oxycodone?

## 2011-12-19 ENCOUNTER — Other Ambulatory Visit: Payer: Self-pay | Admitting: Endocrinology

## 2011-12-19 ENCOUNTER — Telehealth: Payer: Self-pay | Admitting: Endocrinology

## 2011-12-19 MED ORDER — TRAMADOL HCL 50 MG PO TABS: 50.0000 mg | ORAL_TABLET | Freq: Four times a day (QID) | ORAL | Status: DC | PRN

## 2011-12-19 NOTE — Telephone Encounter (Signed)
i sent rx 

## 2011-12-19 NOTE — Telephone Encounter (Signed)
Patient called stating that he would like to have a non-narcotic pain med called in so that he does not have to come in and pick it up. Patient's pharmacy is [facility] Please advise/assist.

## 2011-12-19 NOTE — Telephone Encounter (Signed)
Pt aware.

## 2011-12-20 ENCOUNTER — Other Ambulatory Visit: Payer: Self-pay | Admitting: Endocrinology

## 2011-12-28 ENCOUNTER — Other Ambulatory Visit: Payer: Self-pay | Admitting: Endocrinology

## 2011-12-29 MED ORDER — CLONIDINE HCL 0.1 MG PO TABS: 0.1000 mg | ORAL_TABLET | Freq: Two times a day (BID) | ORAL | Status: DC

## 2011-12-29 NOTE — Telephone Encounter (Signed)
Patient request refill on medication. Please advise. Last ov 10/2011.

## 2011-12-29 NOTE — Addendum Note (Signed)
Addended by: Elnora Morrison on: 12/29/2011 10:42 AM   Modules accepted: Orders

## 2012-01-04 ENCOUNTER — Other Ambulatory Visit: Payer: Self-pay | Admitting: Endocrinology

## 2012-01-18 ENCOUNTER — Other Ambulatory Visit: Payer: Self-pay | Admitting: Endocrinology

## 2012-01-20 ENCOUNTER — Other Ambulatory Visit: Payer: Self-pay | Admitting: Endocrinology

## 2012-02-27 ENCOUNTER — Other Ambulatory Visit: Payer: Self-pay

## 2012-02-27 ENCOUNTER — Telehealth: Payer: Self-pay | Admitting: Endocrinology

## 2012-02-27 MED ORDER — INSULIN GLARGINE 100 UNIT/ML ~~LOC~~ SOLN: 25.0000 [IU] | Freq: Every morning | SUBCUTANEOUS | Status: DC

## 2012-02-27 NOTE — Telephone Encounter (Signed)
Pt dropped his insulin vial and it has broken. He uses insulin daily, needs [place] to call his pharmacy to authorize a replacement. He was able to take his dose for today. Call back number - [phone] / Sherri S.

## 2012-02-28 ENCOUNTER — Telehealth: Payer: Self-pay | Admitting: Endocrinology

## 2012-02-28 NOTE — Telephone Encounter (Signed)
The patient called again to report that he dropped and broke his insulin vial and needs to be able to get insulin but his insurance won't pay for 30 days.   The patient is completely out of insulin at this time.  The patient may be reached at [phone].

## 2012-02-28 NOTE — Telephone Encounter (Signed)
Pt advised we sent rx for lantus to pharmacy yesterday, pt stated he was not aware another rx had been sent to pharmacy

## 2012-03-20 ENCOUNTER — Other Ambulatory Visit: Payer: Self-pay | Admitting: Endocrinology

## 2012-03-23 ENCOUNTER — Emergency Department (HOSPITAL_COMMUNITY): Payer: Medicare Other

## 2012-03-23 ENCOUNTER — Observation Stay (HOSPITAL_COMMUNITY)
Admission: EM | Admit: 2012-03-23 | Discharge: 2012-03-24 | Disposition: A | Discharge status: Home / Self Care | Payer: Medicare Other | Attending: Emergency Medicine | Admitting: Emergency Medicine

## 2012-03-23 DIAGNOSIS — Z9981 Dependence on supplemental oxygen: Secondary | ICD-10-CM | POA: Insufficient documentation

## 2012-03-23 DIAGNOSIS — E119 Type 2 diabetes mellitus without complications: Secondary | ICD-10-CM | POA: Insufficient documentation

## 2012-03-23 DIAGNOSIS — J9610 Chronic respiratory failure, unspecified whether with hypoxia or hypercapnia: Secondary | ICD-10-CM

## 2012-03-23 DIAGNOSIS — G4730 Sleep apnea, unspecified: Secondary | ICD-10-CM | POA: Insufficient documentation

## 2012-03-23 DIAGNOSIS — Z9911 Dependence on respirator [ventilator] status: Secondary | ICD-10-CM

## 2012-03-23 DIAGNOSIS — C673 Malignant neoplasm of anterior wall of bladder: Principal | ICD-10-CM | POA: Insufficient documentation

## 2012-03-23 DIAGNOSIS — I10 Essential (primary) hypertension: Secondary | ICD-10-CM | POA: Insufficient documentation

## 2012-03-23 DIAGNOSIS — I509 Heart failure, unspecified: Secondary | ICD-10-CM | POA: Insufficient documentation

## 2012-03-23 DIAGNOSIS — E7800 Pure hypercholesterolemia, unspecified: Secondary | ICD-10-CM | POA: Insufficient documentation

## 2012-03-23 DIAGNOSIS — Z930 Tracheostomy status: Secondary | ICD-10-CM | POA: Insufficient documentation

## 2012-03-23 DIAGNOSIS — J9690 Respiratory failure, unspecified, unspecified whether with hypoxia or hypercapnia: Secondary | ICD-10-CM | POA: Diagnosis present

## 2012-03-23 LAB — CBC WITH DIFFERENTIAL/PLATELET
Basophils Absolute: 0 10*3/uL (ref 0.0–0.1)
Basophils Relative: 0 % (ref 0–1)
Eosinophils Absolute: 0.1 10*3/uL (ref 0.0–0.7)
Eosinophils Relative: 1 % (ref 0–5)
MCH: 29.9 pg (ref 26.0–34.0)
MCHC: 34.8 g/dL (ref 30.0–36.0)
MCV: 86 fL (ref 78.0–100.0)
Platelets: 139 10*3/uL — ABNORMAL LOW (ref 150–400)
RDW: 13.2 % (ref 11.5–15.5)

## 2012-03-23 LAB — BASIC METABOLIC PANEL
Calcium: 9.6 mg/dL (ref 8.4–10.5)
GFR calc non Af Amer: 65 mL/min — ABNORMAL LOW (ref >90)
Glucose, Bld: 177 mg/dL — ABNORMAL HIGH (ref 70–99)
Sodium: 140 mEq/L (ref 135–145)

## 2012-03-23 LAB — PROTIME-INR
INR: 1.25 (ref 0.00–1.49)
Prothrombin Time: 15.5 seconds — ABNORMAL HIGH (ref 11.6–15.2)

## 2012-03-23 LAB — GLUCOSE, CAPILLARY: Glucose-Capillary: 186 mg/dL — ABNORMAL HIGH (ref 70–99)

## 2012-03-23 MED ORDER — HEPARIN SODIUM (PORCINE) 5000 UNIT/ML IJ SOLN
5000.0000 [IU] | Freq: Three times a day (TID) | INTRAMUSCULAR | Status: DC
  Administered 2012-03-24 (×2): 5000 [IU] via SUBCUTANEOUS
  Filled 2012-03-23 (×2): qty 1

## 2012-03-23 MED ORDER — ASPIRIN 300 MG RE SUPP: 300.0000 mg | RECTAL | Status: AC

## 2012-03-23 MED ORDER — SODIUM CHLORIDE 0.9 % IV SOLN: 250.0000 mL | INTRAVENOUS | Status: DC | PRN

## 2012-03-23 MED ORDER — ASPIRIN 81 MG PO CHEW
324.0000 mg | CHEWABLE_TABLET | ORAL | Status: AC
  Administered 2012-03-24: 324 mg via ORAL
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
## 2012-03-24 DIAGNOSIS — Z9911 Dependence on respirator [ventilator] status: Secondary | ICD-10-CM

## 2012-03-24 DIAGNOSIS — J9610 Chronic respiratory failure, unspecified whether with hypoxia or hypercapnia: Secondary | ICD-10-CM

## 2012-03-24 LAB — CREATININE, SERUM
Creatinine, Ser: 1.17 mg/dL (ref 0.50–1.35)
GFR calc non Af Amer: 63 mL/min — ABNORMAL LOW (ref >90)

## 2012-03-24 LAB — CBC
Hemoglobin: 13.9 g/dL (ref 13.0–17.0)
MCHC: 34.1 g/dL (ref 30.0–36.0)
WBC: 6.7 10*3/uL (ref 4.0–10.5)

## 2012-03-24 MED ORDER — TRAMADOL HCL 50 MG PO TABS: 50.0000 mg | ORAL_TABLET | Freq: Four times a day (QID) | ORAL | Status: DC | PRN

## 2012-03-24 MED ORDER — FUROSEMIDE 20 MG PO TABS
40.0000 mg | ORAL_TABLET | Freq: Every day | ORAL | Status: DC
  Administered 2012-03-24: 40 mg via ORAL
  Filled 2012-03-24: qty 2

## 2012-03-24 MED ORDER — WARFARIN SODIUM 5 MG PO TABS: 5.0000 mg | ORAL_TABLET | Freq: Every day | ORAL | Status: DC

## 2012-03-24 MED ORDER — CLONIDINE HCL 0.1 MG PO TABS
0.1000 mg | ORAL_TABLET | Freq: Two times a day (BID) | ORAL | Status: DC
  Administered 2012-03-24: 0.1 mg via ORAL
  Filled 2012-03-24: qty 1

## 2012-03-24 MED ORDER — ALPRAZOLAM 0.25 MG PO TABS: 1.0000 mg | ORAL_TABLET | Freq: Three times a day (TID) | ORAL | Status: DC

## 2012-03-24 MED ORDER — INSULIN GLARGINE 100 UNIT/ML ~~LOC~~ SOLN
25.0000 [IU] | Freq: Every morning | SUBCUTANEOUS | Status: DC
  Administered 2012-03-24: 25 [IU] via SUBCUTANEOUS
  Filled 2012-03-24: qty 1

## 2012-03-24 MED ORDER — ALPRAZOLAM ER 1 MG PO TB24: 3.0000 mg | ORAL_TABLET | Freq: Every day | ORAL | Status: DC

## 2012-03-24 MED ORDER — ASPIRIN 81 MG PO CHEW
CHEWABLE_TABLET | ORAL | Status: AC
  Filled 2012-03-24: qty 4

## 2012-03-24 MED ORDER — LACTULOSE 10 GM/15ML PO SOLN
10.0000 g | Freq: Every day | ORAL | Status: DC
  Filled 2012-03-24: qty 15

## 2012-03-24 MED ORDER — WARFARIN SODIUM 5 MG PO TABS
5.0000 mg | ORAL_TABLET | ORAL | Status: DC
  Administered 2012-03-24: 5 mg via ORAL
  Filled 2012-03-24: qty 1

## 2012-03-24 MED ORDER — POTASSIUM CHLORIDE CRYS ER 20 MEQ PO TBCR
40.0000 meq | EXTENDED_RELEASE_TABLET | Freq: Every day | ORAL | Status: DC
  Administered 2012-03-24: 40 meq via ORAL
  Filled 2012-03-24: qty 2

## 2012-03-24 MED ORDER — METOCLOPRAMIDE HCL 5 MG PO TABS
5.0000 mg | ORAL_TABLET | Freq: Three times a day (TID) | ORAL | Status: DC
  Administered 2012-03-24 (×2): 5 mg via ORAL
  Filled 2012-03-24 (×4): qty 1

## 2012-03-24 MED ORDER — WARFARIN - PHYSICIAN DOSING INPATIENT: Freq: Every day | Status: DC

## 2012-03-24 MED ORDER — AMLODIPINE BESYLATE 10 MG PO TABS
10.0000 mg | ORAL_TABLET | Freq: Every day | ORAL | Status: DC
  Administered 2012-03-24: 10 mg via ORAL
  Filled 2012-03-24: qty 1

## 2012-03-24 MED ORDER — ONE-DAILY MULTI VITAMINS PO TABS: 1.0000 | ORAL_TABLET | Freq: Every day | ORAL | Status: DC

## 2012-03-24 MED ORDER — OXYCODONE HCL 5 MG PO TABS: 15.0000 mg | ORAL_TABLET | ORAL | Status: DC | PRN

## 2012-03-24 MED ORDER — SIMVASTATIN 20 MG PO TABS
20.0000 mg | ORAL_TABLET | Freq: Every day | ORAL | Status: DC
  Administered 2012-03-24: 20 mg via ORAL
  Filled 2012-03-24: qty 1

## 2012-03-24 MED ORDER — ACETAMINOPHEN 500 MG PO TABS
500.0000 mg | ORAL_TABLET | Freq: Four times a day (QID) | ORAL | Status: DC | PRN
  Administered 2012-03-24 (×2): 500 mg via ORAL
  Filled 2012-03-24 (×2): qty 1

## 2012-03-24 MED ORDER — ADULT MULTIVITAMIN W/MINERALS CH
1.0000 | ORAL_TABLET | Freq: Every day | ORAL | Status: DC
  Administered 2012-03-24: 1 via ORAL
  Filled 2012-03-24: qty 1

## 2012-03-24 MED ORDER — POLYETHYLENE GLYCOL 3350 17 G PO PACK
17.0000 g | PACK | Freq: Two times a day (BID) | ORAL | Status: DC
  Filled 2012-03-24 (×2): qty 1

## 2012-03-24 MED ORDER — IRBESARTAN 75 MG PO TABS
75.0000 mg | ORAL_TABLET | Freq: Every day | ORAL | Status: DC
  Administered 2012-03-24: 75 mg via ORAL
  Filled 2012-03-24: qty 1

## 2012-03-24 MED ORDER — WARFARIN SODIUM 10 MG PO TABS: 10.0000 mg | ORAL_TABLET | ORAL | Status: DC

## 2012-03-24 MED ORDER — CARVEDILOL 6.25 MG PO TABS
6.2500 mg | ORAL_TABLET | Freq: Two times a day (BID) | ORAL | Status: DC
  Administered 2012-03-24 (×2): 6.25 mg via ORAL
  Filled 2012-03-24 (×3): qty 1
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
## 2012-03-24 NOTE — ED Notes (Signed)
Patient was given at home medication at bedside.  20mg  Simastatin, 6.25mg  Carvedilol (coreg), 0.1mg  Clonidine.

## 2012-03-24 NOTE — ED Notes (Signed)
RT in to see patient.
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
## 2012-03-24 NOTE — Progress Notes (Signed)
Patient removed from vent and trach cuff deflated.  Patient didn't want to be placed on oxygen stated that he didn't use it at home.  RT will continue to monitor.

## 2012-03-24 NOTE — ED Provider Notes (Signed)
Pt in no distress, eating a meal.  He is waiting for his power to return (pt uses at ventilator at home) He hopes to return home tomorrow He is in no distress.   Critical care is aware of this patient  Joya Gaskins, MD 03/24/12 [phone]
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
## 2012-03-24 NOTE — ED Notes (Signed)
Pt suctioned per request suctioned small amt of clear sputum.  States he wants Vent turned "up"  resp called to evaluate

## 2012-03-24 NOTE — ED Notes (Signed)
Pt awake, small amount of clear sputum suctioned from trach.

## 2012-03-24 NOTE — ED Notes (Signed)
meds held until pt off vent in the am

## 2012-03-24 NOTE — ED Provider Notes (Signed)
I saw and evaluated the patient, reviewed the resident's note and I agree with the findings and plan.  Morbidly obese male with trach, vent dependence at night presenting because power is out at home.  No SOB, cough, chest pain, fever. No distress.  Lungs clear.  Glynn Octave, MD 03/24/12 [phone]

## 2012-03-24 NOTE — Progress Notes (Signed)
Pt is out of room at this time.  

## 2012-03-24 NOTE — ED Notes (Signed)
FSBS at 153.  Suctioned small amt of clear white sputum from trach per pt request

## 2012-03-24 NOTE — ED Notes (Signed)
resp paged to place pt on vent

## 2012-03-26 ENCOUNTER — Other Ambulatory Visit: Payer: Self-pay | Admitting: Endocrinology

## 2012-03-26 ENCOUNTER — Other Ambulatory Visit: Payer: Self-pay | Admitting: *Deleted

## 2012-03-27 MED ORDER — ALPRAZOLAM ER 3 MG PO TB24: 3.0000 mg | ORAL_TABLET | ORAL | Status: DC

## 2012-03-27 NOTE — Telephone Encounter (Signed)
cpx is due 

## 2012-03-28 ENCOUNTER — Other Ambulatory Visit: Payer: Self-pay | Admitting: Endocrinology

## 2012-03-29 ENCOUNTER — Other Ambulatory Visit: Payer: Self-pay | Admitting: *Deleted

## 2012-04-01 ENCOUNTER — Telehealth: Payer: Self-pay | Admitting: General Practice

## 2012-04-01 NOTE — Telephone Encounter (Signed)
i do not follow INR, so i should not refill

## 2012-04-01 NOTE — Telephone Encounter (Signed)
Cindy, did you refill this medication for this pt?

## 2012-04-01 NOTE — Telephone Encounter (Signed)
LMOM for patient to call coumadin clinic.  Also need to know who is following patient's INR before re-filling coumadin.

## 2012-04-01 NOTE — Telephone Encounter (Signed)
Dr Everardo All, do you follow this pt's INR?

## 2012-04-04 ENCOUNTER — Telehealth: Payer: Self-pay | Admitting: *Deleted

## 2012-04-04 NOTE — Telephone Encounter (Signed)
i cannot fill this, because i do not follow this.  It looks like [facility] follows this

## 2012-04-04 NOTE — Telephone Encounter (Signed)
Pt has appt at [address] [facility] on 3/24.  They will need to re-fill coumadin.

## 2012-04-04 NOTE — Telephone Encounter (Signed)
Arline Asp, have you been able to find out who is following this pt's INR and refill his coumadin? Thanks!

## 2012-04-05 NOTE — Telephone Encounter (Signed)
i cannot fill this, because i do not follow this.  It looks like [facility] follows this 

## 2012-04-05 NOTE — Telephone Encounter (Signed)
Tried to call pt 2 times and unable to reach. Phone just rang, no answering machine.

## 2012-04-08 ENCOUNTER — Ambulatory Visit (INDEPENDENT_AMBULATORY_CARE_PROVIDER_SITE_OTHER): Payer: Medicare Other | Admitting: Pharmacist

## 2012-04-08 DIAGNOSIS — I2699 Other pulmonary embolism without acute cor pulmonale: Secondary | ICD-10-CM

## 2012-04-08 MED ORDER — WARFARIN SODIUM 10 MG PO TABS: ORAL_TABLET | ORAL | Status: DC

## 2012-04-08 NOTE — Telephone Encounter (Signed)
Tried calling pt na answer, several times per [name] , cardiology is following pt's pt/inr

## 2012-04-08 NOTE — Telephone Encounter (Signed)
Please call coumadin clinic at [facility]. Do they see pt for coumadin?

## 2012-04-17 ENCOUNTER — Other Ambulatory Visit: Payer: Self-pay | Admitting: Endocrinology

## 2012-04-18 ENCOUNTER — Other Ambulatory Visit: Payer: Self-pay | Admitting: *Deleted

## 2012-04-18 MED ORDER — CARVEDILOL 6.25 MG PO TABS: 6.2500 mg | ORAL_TABLET | Freq: Two times a day (BID) | ORAL | Status: DC

## 2012-04-23 ENCOUNTER — Telehealth: Payer: Self-pay | Admitting: Endocrinology

## 2012-04-23 NOTE — Telephone Encounter (Signed)
Spoke w/ pt and advised he is due for his CPE. Pt declines appt at this time, states he will call back to sch appt when ready / Sherri S.

## 2012-04-24 ENCOUNTER — Other Ambulatory Visit: Payer: Self-pay | Admitting: Endocrinology

## 2012-04-29 ENCOUNTER — Telehealth: Payer: Self-pay | Admitting: Endocrinology

## 2012-05-01 ENCOUNTER — Telehealth: Payer: Self-pay | Admitting: Endocrinology

## 2012-05-01 NOTE — Telephone Encounter (Signed)
recv'd fax request from [facility] for knee orthosis w/ adjustable knee joints. Per Dr. Everardo All, pt must have OV before we can sign order. Faxed note back to [facility] at # [phone]. Spoke w/pt 04/23/12 and advised he needs CPE. Pt declined appt / Sherri S.  [facility] # [phone], fax-[phone]

## 2012-05-15 ENCOUNTER — Other Ambulatory Visit: Payer: Self-pay

## 2012-05-15 ENCOUNTER — Other Ambulatory Visit: Payer: Self-pay | Admitting: Endocrinology

## 2012-05-15 MED ORDER — ALPRAZOLAM ER 3 MG PO TB24: 3.0000 mg | ORAL_TABLET | ORAL | Status: DC

## 2012-05-16 ENCOUNTER — Telehealth: Payer: Self-pay | Admitting: Endocrinology

## 2012-05-16 NOTE — Telephone Encounter (Signed)
please call patient: cpx is due We'll take care of alprazolam refill then

## 2012-05-17 ENCOUNTER — Telehealth: Payer: Self-pay

## 2012-05-17 NOTE — Telephone Encounter (Signed)
Pt called to sch follow up next week, we will sch CPE at that visit / Sherri

## 2012-05-17 NOTE — Telephone Encounter (Signed)
please call patient: cpx is due We'll take care of alprazolam refill then 

## 2012-05-22 ENCOUNTER — Encounter: Payer: Self-pay | Admitting: Endocrinology

## 2012-05-22 ENCOUNTER — Ambulatory Visit (INDEPENDENT_AMBULATORY_CARE_PROVIDER_SITE_OTHER): Payer: Medicare Other | Admitting: Endocrinology

## 2012-05-22 ENCOUNTER — Other Ambulatory Visit: Payer: Self-pay | Admitting: Endocrinology

## 2012-05-22 VITALS — BP 122/78 | HR 68 | Wt 353.0 lb

## 2012-05-22 DIAGNOSIS — Z79899 Other long term (current) drug therapy: Secondary | ICD-10-CM | POA: Insufficient documentation

## 2012-05-22 DIAGNOSIS — E7800 Pure hypercholesterolemia, unspecified: Secondary | ICD-10-CM

## 2012-05-22 DIAGNOSIS — R0609 Other forms of dyspnea: Secondary | ICD-10-CM

## 2012-05-22 DIAGNOSIS — N289 Disorder of kidney and ureter, unspecified: Secondary | ICD-10-CM

## 2012-05-22 DIAGNOSIS — R0989 Other specified symptoms and signs involving the circulatory and respiratory systems: Secondary | ICD-10-CM

## 2012-05-22 DIAGNOSIS — E1029 Type 1 diabetes mellitus with other diabetic kidney complication: Secondary | ICD-10-CM

## 2012-05-22 DIAGNOSIS — I10 Essential (primary) hypertension: Secondary | ICD-10-CM

## 2012-05-22 DIAGNOSIS — D649 Anemia, unspecified: Secondary | ICD-10-CM

## 2012-05-22 MED ORDER — ALPRAZOLAM ER 3 MG PO TB24: 3.0000 mg | ORAL_TABLET | Freq: Every day | ORAL | Status: DC

## 2012-05-22 MED ORDER — HYDROCODONE-ACETAMINOPHEN 10-325 MG PO TABS: 1.0000 | ORAL_TABLET | Freq: Four times a day (QID) | ORAL | Status: DC | PRN

## 2012-05-22 NOTE — Patient Instructions (Addendum)
Please come back for a regular physical appointment in 3 months.   check your blood sugar twice a day.  vary the time of day when you check, between before the 3 meals, and at bedtime.  also check if you have symptoms of your blood sugar being too high or too low.  please keep a record of the readings and bring it to your next appointment here.  please call us sooner if your blood sugar goes below 70, or if you have a lot of readings over 200.   blood tests are being requested for you today.  We'll contact you with results.

## 2012-05-22 NOTE — Progress Notes (Signed)
Subjective:    Patient ID: Travis Chapman, male    DOB: 11-22-1945, 67 y.o.   MRN: [id number]  HPI The state of at least three ongoing medical problems is addressed today, with interval history of each noted here: Pt returns for f/u of insulin-requiring DM (dx'ed 1992; complicated by nephropathy; therapy has been limited by pt's need for a simple regimen). no cbg record, but states cbg's are in the mid-100's.  Low-back pain.  Pt says this is well-controlled.  He denies weight change Anemia: He does not take fe tabs.  He denies hematuria. Renal insuff: he denies sob. Past Medical History  Diagnosis Date  . ANXIETY 07/23/2006  . ASTHMA 07/23/2006  . DM 05/23/2007  . HYPERTENSION 07/23/2006  . OSTEOARTHRITIS 07/23/2006  . HYPERCHOLESTEROLEMIA 05/23/2007  . ANEMIA 08/31/2008  . Palpitations 02/10/2008  . ALCOHOL ABUSE, IN REMISSION, HX OF 08/26/2008  . TOBACCO ABUSE, HX OF 08/26/2008  . Pulmonary embolism 03/16/2010  . ED (erectile dysfunction)   . Morbid obesity   . DM retinopathy   . 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 DM neuropathy with neurologic complication   . CHF (congestive heart failure)     With a preserved EF  . 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 Sleep apnea   . Shortness of breath   . CARCINOMA, PROSTATE 09/29/2009    Past Surgical History  Procedure Laterality Date  . Back surgery    . Tracheostomy      History   Social History  . Marital Status: Divorced    Spouse Name: N/A    Number of Children: N/A  . Years of Education: N/A   Occupational History  . Disabled    Social History Main Topics  . Smoking status: Former Smoker    Quit date: 01/16/2005  . Smokeless tobacco: Not on file  . Alcohol Use: No  . Drug Use: Not on file  . Sexually Active: Not on file   Other Topics Concern  . Not on file   Social History Narrative   Single   Lives with his elderly mother           Current Outpatient Prescriptions on File Prior to Visit  Medication Sig Dispense Refill  . acetaminophen (TYLENOL) 500 MG tablet Take 1 tablet (500 mg total) by mouth every 6 (six) hours as needed for pain.  30 tablet  0  . Alcohol Swabs (ALCOHOL PREP) 70 % PADS       . amLODipine (NORVASC) 10 MG tablet Take 10 mg by mouth daily.      Marland Kitchen BENICAR 40 MG tablet TAKE ONE TABLET BY MOUTH EVERY DAY  90 tablet  1  . carvedilol (COREG) 6.25 MG tablet Take 1 tablet (6.25 mg total) by mouth 2 (two) times daily with a meal.  60 tablet  4  . citalopram (CELEXA) 40 MG tablet       . cloNIDine (CATAPRES) 0.1 MG tablet TAKE ONE TABLET BY MOUTH TWICE DAILY  60 tablet  0  . EASY COMFORT INSULIN SYRINGE 30G X 1/2" 0.5 ML MISC       . EASY COMFORT INSULIN SYRINGE 30G X 5/16" 1 ML MISC       . furosemide (LASIX) 40 MG tablet TAKE ONE TABLET BY MOUTH TWICE DAILY  60 tablet  10  . insulin glargine (LANTUS) 100 UNIT/ML injection  Inject 25 Units into the skin every morning.  10 mL  3  . Multiple Vitamin (MULTIVITAMIN) tablet Take 1 tablet by mouth daily.        . polyethylene glycol (MIRALAX / GLYCOLAX) packet Take 17 g by mouth daily as needed (constipation).      . potassium chloride SA (K-DUR,KLOR-CON) 20 MEQ tablet Take 2 tablets (40 mEq total) by mouth daily.  60 tablet  5  . RELION INSULIN SYR 1CC/30G 30G X 5/16" 1 ML MISC USE AS DIRECTED  100 each  3  . simvastatin (ZOCOR) 20 MG tablet TAKE ONE TABLET BY MOUTH AT BEDTIME  30 tablet  5  . traMADol (ULTRAM) 50 MG tablet Take 1 tablet (50 mg total) by mouth every 6 (six) hours as needed for pain.  30 tablet  5  . warfarin (COUMADIN) 10 MG tablet Take as directed by Anticoagulation Clinic  45 tablet  3   No current facility-administered medications on file prior to visit.   No Known Allergies  Family History  Problem Relation Age of Onset  . Cancer Neg Hx    BP 122/78  Pulse 68  Wt 353 lb (160.12 kg)  BMI 45.3 kg/m2  SpO2 95%  Review of  Systems denies hypoglycemia and brbpr    Objective:   Physical Exam VITAL SIGNS:  See vs page GENERAL: no distress     Assessment & Plan:  DM: uncertain control fe-deficiency anemia, uncertain control Low-back pain, well-controlled Renal insuff.   We can follow this, as he is on lasix and benicar

## 2012-06-12 ENCOUNTER — Other Ambulatory Visit: Payer: Self-pay | Admitting: Endocrinology

## 2012-06-19 ENCOUNTER — Other Ambulatory Visit: Payer: Self-pay | Admitting: Endocrinology

## 2012-06-24 ENCOUNTER — Ambulatory Visit: Payer: Medicare Other | Admitting: Endocrinology

## 2012-06-24 ENCOUNTER — Encounter: Payer: Self-pay | Admitting: Endocrinology

## 2012-06-24 ENCOUNTER — Ambulatory Visit (INDEPENDENT_AMBULATORY_CARE_PROVIDER_SITE_OTHER): Payer: Medicare Other | Admitting: Endocrinology

## 2012-06-24 VITALS — BP 130/78 | HR 78 | Ht 74.0 in | Wt 360.0 lb

## 2012-06-24 DIAGNOSIS — E1029 Type 1 diabetes mellitus with other diabetic kidney complication: Secondary | ICD-10-CM

## 2012-06-24 DIAGNOSIS — E7800 Pure hypercholesterolemia, unspecified: Secondary | ICD-10-CM

## 2012-06-24 DIAGNOSIS — D649 Anemia, unspecified: Secondary | ICD-10-CM

## 2012-06-24 DIAGNOSIS — L97909 Non-pressure chronic ulcer of unspecified part of unspecified lower leg with unspecified severity: Secondary | ICD-10-CM

## 2012-06-24 DIAGNOSIS — I10 Essential (primary) hypertension: Secondary | ICD-10-CM

## 2012-06-24 DIAGNOSIS — L97911 Non-pressure chronic ulcer of unspecified part of right lower leg limited to breakdown of skin: Secondary | ICD-10-CM

## 2012-06-24 LAB — URINALYSIS, ROUTINE W REFLEX MICROSCOPIC
Hgb urine dipstick: NEGATIVE
Leukocytes, UA: NEGATIVE
Nitrite: NEGATIVE
Specific Gravity, Urine: 1.025 (ref 1.000–1.030)
Urobilinogen, UA: 1 (ref 0.0–1.0)

## 2012-06-24 LAB — IBC PANEL
Iron: 60 ug/dL (ref 42–165)
Saturation Ratios: 23.4 % (ref 20.0–50.0)
Transferrin: 183.4 mg/dL — ABNORMAL LOW (ref 212.0–360.0)

## 2012-06-24 LAB — BASIC METABOLIC PANEL
Calcium: 9 mg/dL (ref 8.4–10.5)
GFR: 71.59 mL/min (ref >60.00)
Glucose, Bld: 154 mg/dL — ABNORMAL HIGH (ref 70–99)
Sodium: 142 mEq/L (ref 135–145)

## 2012-06-24 LAB — HEPATIC FUNCTION PANEL
AST: 16 U/L (ref 0–37)
Albumin: 3.2 g/dL — ABNORMAL LOW (ref 3.5–5.2)
Alkaline Phosphatase: 68 U/L (ref 39–117)
Total Bilirubin: 0.4 mg/dL (ref 0.3–1.2)
Total Protein: 7.2 g/dL (ref 6.0–8.3)

## 2012-06-24 LAB — CBC WITH DIFFERENTIAL/PLATELET
Basophils Relative: 0.2 % (ref 0.0–3.0)
Eosinophils Relative: 1.5 % (ref 0.0–5.0)
MCV: 89.1 fl (ref 78.0–100.0)
Monocytes Absolute: 0.4 10*3/uL (ref 0.1–1.0)
Monocytes Relative: 6.3 % (ref 3.0–12.0)
Neutrophils Relative %: 78.6 % — ABNORMAL HIGH (ref 43.0–77.0)
Platelets: 192 10*3/uL (ref 150.0–400.0)
RBC: 4.31 Mil/uL (ref 4.22–5.81)
WBC: 6.9 10*3/uL (ref 4.5–10.5)

## 2012-06-24 LAB — LIPID PANEL
Total CHOL/HDL Ratio: 3
VLDL: 21.6 mg/dL (ref 0.0–40.0)

## 2012-06-24 LAB — HEMOGLOBIN A1C: Hgb A1c MFr Bld: 7.3 % — ABNORMAL HIGH (ref 4.6–6.5)

## 2012-06-24 NOTE — Progress Notes (Signed)
Subjective:    Patient ID: Travis Chapman, male    DOB: 1945/11/07, 66 y.o.   MRN: [id number]  HPI Pt states 1 week of slight ulcer on the right leg, and assoc itching. Past Medical History  Diagnosis Date  . ANXIETY 07/23/2006  . ASTHMA 07/23/2006  . DM 05/23/2007  . HYPERTENSION 07/23/2006  . OSTEOARTHRITIS 07/23/2006  . HYPERCHOLESTEROLEMIA 05/23/2007  . ANEMIA 08/31/2008  . Palpitations 02/10/2008  . ALCOHOL ABUSE, IN REMISSION, HX OF 08/26/2008  . TOBACCO ABUSE, HX OF 08/26/2008  . Pulmonary embolism 03/16/2010  . ED (erectile dysfunction)   . Morbid obesity   . DM retinopathy   . 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 DM neuropathy with neurologic complication   . CHF (congestive heart failure)     With a preserved EF  . 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 Sleep apnea   . Shortness of breath   . CARCINOMA, PROSTATE 09/29/2009    Past Surgical History  Procedure Laterality Date  . Back surgery    . Tracheostomy      History   Social History  . Marital Status: Divorced    Spouse Name: N/A    Number of Children: N/A  . Years of Education: N/A   Occupational History  . Disabled    Social History Main Topics  . Smoking status: Former Smoker    Quit date: 01/16/2005  . Smokeless tobacco: Not on file  . Alcohol Use: No  . Drug Use: Not on file  . Sexually Active: Not on file   Other Topics Concern  . Not on file   Social History Narrative   Single   Lives with his elderly mother          Current Outpatient Prescriptions on File Prior to Visit  Medication Sig Dispense Refill  . acetaminophen (TYLENOL) 500 MG tablet Take 1 tablet (500 mg total) by mouth every 6 (six) hours as needed for pain.  30 tablet  0  . Alcohol Swabs (ALCOHOL PREP) 70 % PADS       . ALPRAZolam (XANAX XR) 3 MG 24 hr tablet Take 1 tablet (3 mg total) by mouth daily.  30 tablet  5  . amLODipine (NORVASC) 10 MG  tablet Take 10 mg by mouth daily.      Marland Kitchen amLODipine (NORVASC) 10 MG tablet TAKE ONE TABLET BY MOUTH EVERY DAY  30 tablet  0  . BENICAR 40 MG tablet TAKE ONE TABLET BY MOUTH EVERY DAY  90 tablet  1  . carvedilol (COREG) 6.25 MG tablet Take 1 tablet (6.25 mg total) by mouth 2 (two) times daily with a meal.  60 tablet  4  . citalopram (CELEXA) 40 MG tablet       . cloNIDine (CATAPRES) 0.1 MG tablet TAKE ONE TABLET BY MOUTH TWICE DAILY  60 tablet  0  . EASY COMFORT INSULIN SYRINGE 30G X 1/2" 0.5 ML MISC       . EASY COMFORT INSULIN SYRINGE 30G X 5/16" 1 ML MISC       . furosemide (LASIX) 40 MG tablet TAKE ONE TABLET BY MOUTH TWICE DAILY  60 tablet  10  . HYDROcodone-acetaminophen (NORCO) 10-325 MG per tablet Take 1 tablet by mouth every 6 (six) hours as needed for pain.  30 tablet  5  . insulin glargine (LANTUS) 100 UNIT/ML  injection Inject 25 Units into the skin every morning.  10 mL  3  . metoCLOPramide (REGLAN) 5 MG tablet TAKE ONE TABLET BY MOUTH THREE TIMES DAILY  90 tablet  0  . Multiple Vitamin (MULTIVITAMIN) tablet Take 1 tablet by mouth daily.        . polyethylene glycol (MIRALAX / GLYCOLAX) packet Take 17 g by mouth daily as needed (constipation).      . potassium chloride SA (K-DUR,KLOR-CON) 20 MEQ tablet Take 2 tablets (40 mEq total) by mouth daily.  60 tablet  5  . RELION INSULIN SYR 1CC/30G 30G X 5/16" 1 ML MISC USE AS DIRECTED  100 each  3  . simvastatin (ZOCOR) 20 MG tablet TAKE ONE TABLET BY MOUTH EVERY DAY AT BEDTIME  30 tablet  0  . simvastatin (ZOCOR) 20 MG tablet TAKE ONE TABLET BY MOUTH EVERY DAY AT BEDTIME  30 tablet  0  . traMADol (ULTRAM) 50 MG tablet Take 1 tablet (50 mg total) by mouth every 6 (six) hours as needed for pain.  30 tablet  5  . warfarin (COUMADIN) 10 MG tablet Take as directed by Anticoagulation Clinic  45 tablet  3   No current facility-administered medications on file prior to visit.    No Known Allergies  Family History  Problem Relation Age of  Onset  . Cancer Neg Hx     BP 130/78  Pulse 78  Ht 6\' 2"  (1.88 m)  Wt 360 lb (163.295 kg)  BMI 46.2 kg/m2  SpO2 97%   Review of Systems He has slight right leg pain.  He says the edema is unchanged from chronic.       Objective:   Physical Exam VITAL SIGNS:  See vs page GENERAL: no distress.  Morbid obesity.  In wheelchair Right leg: 1+ edema Posterior aspect: 2-3 cm diameter shallow ulcer.  No drainage/erythema/swelling      Assessment & Plan:  Leg ulcer, new. Edema, unchanged, but this places him at risk for ulcers. DM, uncertain control.  Pt is advised to do previously ordered labs today.

## 2012-06-24 NOTE — Patient Instructions (Addendum)
Refer to a wound-care specialist.  you will receive a phone call, about a day and time for an appointment.   Until then, keep it covered with antibiotic ointment and a large bandaid.

## 2012-06-25 LAB — MICROALBUMIN / CREATININE URINE RATIO
Creatinine,U: 86 mg/dL
Microalb Creat Ratio: 240.7 mg/g — ABNORMAL HIGH (ref 0.0–30.0)
Microalb, Ur: 207 mg/dL — ABNORMAL HIGH (ref 0.0–1.9)

## 2012-06-27 ENCOUNTER — Other Ambulatory Visit: Payer: Self-pay | Admitting: *Deleted

## 2012-06-27 MED ORDER — METOCLOPRAMIDE HCL 5 MG PO TABS: 5.0000 mg | ORAL_TABLET | Freq: Three times a day (TID) | ORAL | Status: DC

## 2012-07-15 ENCOUNTER — Encounter (HOSPITAL_BASED_OUTPATIENT_CLINIC_OR_DEPARTMENT_OTHER): Payer: Medicare Other | Attending: General Surgery

## 2012-07-15 DIAGNOSIS — J449 Chronic obstructive pulmonary disease, unspecified: Secondary | ICD-10-CM | POA: Insufficient documentation

## 2012-07-15 DIAGNOSIS — J4489 Other specified chronic obstructive pulmonary disease: Secondary | ICD-10-CM | POA: Insufficient documentation

## 2012-07-15 DIAGNOSIS — Z79899 Other long term (current) drug therapy: Secondary | ICD-10-CM | POA: Insufficient documentation

## 2012-07-15 DIAGNOSIS — Z930 Tracheostomy status: Secondary | ICD-10-CM | POA: Insufficient documentation

## 2012-07-15 DIAGNOSIS — E119 Type 2 diabetes mellitus without complications: Secondary | ICD-10-CM | POA: Insufficient documentation

## 2012-07-15 DIAGNOSIS — Z794 Long term (current) use of insulin: Secondary | ICD-10-CM | POA: Insufficient documentation

## 2012-07-15 DIAGNOSIS — I87319 Chronic venous hypertension (idiopathic) with ulcer of unspecified lower extremity: Secondary | ICD-10-CM | POA: Insufficient documentation

## 2012-07-15 DIAGNOSIS — L97909 Non-pressure chronic ulcer of unspecified part of unspecified lower leg with unspecified severity: Secondary | ICD-10-CM | POA: Insufficient documentation

## 2012-07-15 DIAGNOSIS — I509 Heart failure, unspecified: Secondary | ICD-10-CM | POA: Insufficient documentation

## 2012-07-16 NOTE — Progress Notes (Signed)
[facility]  NAME:  Travis Chapman, Travis Chapman NO.:  [id number]  MEDICAL RECORD NO.:  [id number]      DATE OF BIRTH:  08/10/1945  PHYSICIAN:  Ardath Sax, M.D.           VISIT DATE:                                  OFFICE VISIT   HISTORY:  This is a 67 year old, morbidly obese, African American, who enters our [facility] because of a chronic wound on his right posterior leg.  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  It is about 5 x 6 cm.  He does have diabetes.  He does have congestive heart failure.  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  His weight when he came in today was reported as 350 pounds, blood pressure 149/79, respirations 18, pulse 76, temperature 98.7.  He has many other problems including hypertension, anemia, tobacco abuse, alcohol abuse, diabetic retinopathy, neuropathy, sleep apnea, history of carcinoma of the prostate.  He is on many different medicines of which I will go through. He is on Norvasc, Benicar, Coreg, clonidine, Lasix, Norco, Lantus insulin, MiraLax, potassium chloride, simvastatin, Ultram, and Coumadin 10 mg a day.  Other diagnoses that are written down is coronary artery disease, hypertension, diabetic neuropathy, diabetes mellitus, depression, asthma, osteoarthritis, hypercholesterolemia, and as I mentioned he has got a permanent trach, and he has had back surgery in the past.  I elected to treat this as a venous stasis ulcer, and I am going to put compression and silver alginate and have him come back in a week for a change of his Unna boot.     Ardath Sax, M.D.     PP/MEDQ  D:  07/15/2012  T:  07/16/2012  Job:  [id number]

## 2012-07-17 ENCOUNTER — Other Ambulatory Visit: Payer: Self-pay | Admitting: *Deleted

## 2012-07-17 MED ORDER — AMLODIPINE BESYLATE 10 MG PO TABS: 10.0000 mg | ORAL_TABLET | Freq: Every day | ORAL | Status: DC

## 2012-07-22 ENCOUNTER — Encounter (HOSPITAL_BASED_OUTPATIENT_CLINIC_OR_DEPARTMENT_OTHER): Payer: Medicare Other | Attending: General Surgery

## 2012-07-22 DIAGNOSIS — I87319 Chronic venous hypertension (idiopathic) with ulcer of unspecified lower extremity: Secondary | ICD-10-CM | POA: Insufficient documentation

## 2012-07-22 DIAGNOSIS — L97909 Non-pressure chronic ulcer of unspecified part of unspecified lower leg with unspecified severity: Secondary | ICD-10-CM | POA: Insufficient documentation

## 2012-07-25 ENCOUNTER — Other Ambulatory Visit: Payer: Self-pay | Admitting: *Deleted

## 2012-07-25 MED ORDER — CITALOPRAM HYDROBROMIDE 40 MG PO TABS: 40.0000 mg | ORAL_TABLET | Freq: Every day | ORAL | Status: DC

## 2012-08-22 ENCOUNTER — Other Ambulatory Visit: Payer: Self-pay

## 2012-08-22 MED ORDER — OLMESARTAN MEDOXOMIL 40 MG PO TABS: ORAL_TABLET | ORAL | Status: DC

## 2012-08-22 MED ORDER — SIMVASTATIN 20 MG PO TABS: ORAL_TABLET | ORAL | Status: DC

## 2012-08-28 ENCOUNTER — Other Ambulatory Visit: Payer: Self-pay | Admitting: Endocrinology

## 2012-08-28 ENCOUNTER — Other Ambulatory Visit: Payer: Self-pay

## 2012-08-28 MED ORDER — CARVEDILOL 6.25 MG PO TABS: 6.2500 mg | ORAL_TABLET | Freq: Two times a day (BID) | ORAL | Status: DC

## 2012-09-06 ENCOUNTER — Telehealth: Payer: Self-pay | Admitting: *Deleted

## 2012-09-06 NOTE — Telephone Encounter (Signed)
Refill x 1 cpx is due 

## 2012-09-06 NOTE — Telephone Encounter (Signed)
Pharmacy is requesting lactulose 10 gm/15 sol. Take 30 mls by mouth 3 times daily. Please advise?

## 2012-09-09 ENCOUNTER — Other Ambulatory Visit: Payer: Self-pay

## 2012-09-09 NOTE — Telephone Encounter (Signed)
Pt states he has not missed any testosterone doses, cell # [phone]

## 2012-09-19 ENCOUNTER — Other Ambulatory Visit: Payer: Self-pay | Admitting: *Deleted

## 2012-09-19 MED ORDER — SIMVASTATIN 20 MG PO TABS: ORAL_TABLET | ORAL | Status: DC

## 2012-09-26 ENCOUNTER — Other Ambulatory Visit: Payer: Self-pay | Admitting: *Deleted

## 2012-09-26 MED ORDER — POTASSIUM CHLORIDE CRYS ER 20 MEQ PO TBCR: 40.0000 meq | EXTENDED_RELEASE_TABLET | Freq: Every day | ORAL | Status: DC

## 2012-10-14 ENCOUNTER — Other Ambulatory Visit: Payer: Self-pay

## 2012-10-14 MED ORDER — CLONIDINE HCL 0.1 MG PO TABS: ORAL_TABLET | ORAL | Status: DC

## 2012-11-06 ENCOUNTER — Encounter: Payer: Self-pay | Admitting: Endocrinology

## 2012-11-06 ENCOUNTER — Ambulatory Visit (INDEPENDENT_AMBULATORY_CARE_PROVIDER_SITE_OTHER): Payer: Medicare Other | Admitting: Endocrinology

## 2012-11-06 VITALS — BP 134/70 | HR 69 | Ht 74.0 in | Wt 370.0 lb

## 2012-11-06 DIAGNOSIS — E1029 Type 1 diabetes mellitus with other diabetic kidney complication: Secondary | ICD-10-CM

## 2012-11-06 DIAGNOSIS — Z23 Encounter for immunization: Secondary | ICD-10-CM

## 2012-11-06 DIAGNOSIS — I509 Heart failure, unspecified: Secondary | ICD-10-CM

## 2012-11-06 DIAGNOSIS — I5033 Acute on chronic diastolic (congestive) heart failure: Secondary | ICD-10-CM

## 2012-11-06 DIAGNOSIS — R0602 Shortness of breath: Secondary | ICD-10-CM

## 2012-11-06 MED ORDER — HYDROCODONE-ACETAMINOPHEN 10-325 MG PO TABS: 1.0000 | ORAL_TABLET | ORAL | Status: DC | PRN

## 2012-11-06 MED ORDER — ALPRAZOLAM 2 MG PO TABS: ORAL_TABLET | ORAL | Status: DC

## 2012-11-06 MED ORDER — AMLODIPINE BESYLATE 5 MG PO TABS: 5.0000 mg | ORAL_TABLET | Freq: Every day | ORAL | Status: DC

## 2012-11-06 MED ORDER — LACTULOSE 10 GM/15ML PO SOLN: 20.0000 g | Freq: Two times a day (BID) | ORAL | Status: DC

## 2012-11-06 NOTE — Progress Notes (Signed)
  Subjective:    Patient ID: Travis Chapman, male    DOB: 26-Aug-1945, 67 y.o.   MRN: [id number]  HPI    Review of Systems  Constitutional: Negative for fever and unexpected weight change.  HENT: Negative for hearing loss.   Eyes: Negative for visual disturbance.  Respiratory: Negative for cough.   Cardiovascular: Negative for chest pain.  Gastrointestinal: Negative for blood in stool.  Endocrine: Negative for cold intolerance.  Genitourinary: Negative for hematuria.  Musculoskeletal: Negative for back pain.  Skin: Negative for rash.  Allergic/Immunologic: Negative for environmental allergies.  Neurological: Negative for syncope and numbness.  Psychiatric/Behavioral: Negative for dysphoric mood.       Objective:   Physical Exam VS: see vs page GEN: no distress.  Morbid obesity.  In wheelchair. HEAD: head: no deformity eyes: no periorbital swelling, no proptosis external nose and ears are normal mouth: no lesion seen NECK: supple, thyroid is not enlarged CHEST WALL: no deformity.  Tracheostomy is present LUNGS: clear to auscultation BREASTS:  bilat pseudogynecomastia CV: reg rate and rhythm, no murmur  ABD: abdomen is soft, nontender.  no hepatosplenomegaly.  not distended.  no hernia GENITALIA/RECTAL/PROSTATE:  sees urology.   MUSCULOSKELETAL: muscle bulk and strength are grossly normal.  no obvious joint swelling.  gait is normal and steady PULSES:  no carotid bruit NEURO:  cn 2-12 grossly intact.   readily moves all 4's.   SKIN:  Normal texture and temperature.  No rash or suspicious lesion is visible.   NODES:  None palpable at the neck.   PSYCH: alert, oriented x3.  Does not appear anxious nor depressed.      Assessment & Plan:  Wellness visit today, with problems stable, except as noted. we discussed code status.  pt requests full code, but would not want to be started or maintained on artificial life-support measures if there was not a reasonable chance of  recovery    SEPARATE EVALUATION FOLLOWS--EACH PROBLEM HERE IS NEW, NOT RESPONDING TO TREATMENT, OR POSES SIGNIFICANT RISK TO THE PATIENT'S HEALTH: HISTORY OF THE PRESENT ILLNESS: Pt states 1 month of slight worsening in his chronic swelling of the legs.  He does not have assoc worsening in his chronic sob. Pt says insomnia is not well-controlled no cbg record, but states cbg's are well-controlled. PAST MEDICAL HISTORY reviewed and up to date today REVIEW OF SYSTEMS: denies hypoglycemia.  constipation has recurred PHYSICAL EXAMINATION: VITAL SIGNS:  See vs page GENERAL: no distress LAB/XRAY RESULTS: Lab Results  Component Value Date   CREATININE 1.5 11/06/2012   BUN 17 11/06/2012   NA 138 11/06/2012   K 4.0 11/06/2012   CL 103 11/06/2012   CO2 28 11/06/2012  BNP=48 IMPRESSION: Constipation, recurrent Edema: appears due to norvasc, rather than CHF Insomnia: he needs increased rx Chronic resp failure: being chronically on nighttime ventilator reduces the risk of narcotic + benzodiazepine DM: this is the best control this pt should aim for, given this regimen, which does match insulin to her changing needs throughout the day PLAN: See instruction page

## 2012-11-06 NOTE — Patient Instructions (Addendum)
Please reduce the amlodipine to 5 mg daily. Please come back for a follow-up appointment in 3 months. Please increase the alprazolam to 2x2 mg at bedtime.  i have sent a prescription to your pharmacy, to resume the "lactulose" for your bowels.   blood tests are being requested for you today.  We'll contact you with results.  Based on the results, i may recommend that you also increase the fluid pill.   please consider these measures for your health:  minimize alcohol.  do not use tobacco products.  have a colonoscopy at least every 10 years from age 44.  keep firearms safely stored.  always use seat belts.  have working smoke alarms in your home.  see an eye doctor and dentist regularly.  never drive under the influence of alcohol or drugs (including prescription drugs).   it is critically important to prevent falling down (keep floor areas well-lit, dry, and free of loose objects.  If you have a cane, walker, or wheelchair, you should use it, even for short trips around the house.  Also, try not to rush)

## 2012-11-07 LAB — BASIC METABOLIC PANEL
Chloride: 103 mEq/L (ref 96–112)
Creatinine, Ser: 1.5 mg/dL (ref 0.4–1.5)
Potassium: 4 mEq/L (ref 3.5–5.1)
Sodium: 138 mEq/L (ref 135–145)

## 2012-11-07 LAB — HEMOGLOBIN A1C: Hgb A1c MFr Bld: 7.4 % — ABNORMAL HIGH (ref 4.6–6.5)

## 2012-11-07 LAB — BRAIN NATRIURETIC PEPTIDE: Pro B Natriuretic peptide (BNP): 58 pg/mL (ref 0.0–100.0)

## 2012-11-10 ENCOUNTER — Other Ambulatory Visit: Payer: Self-pay | Admitting: Endocrinology

## 2012-11-18 ENCOUNTER — Other Ambulatory Visit: Payer: Self-pay | Admitting: *Deleted

## 2012-11-18 MED ORDER — CITALOPRAM HYDROBROMIDE 40 MG PO TABS: 40.0000 mg | ORAL_TABLET | Freq: Every day | ORAL | Status: DC

## 2012-11-24 ENCOUNTER — Emergency Department (HOSPITAL_COMMUNITY): Payer: Medicare Other

## 2012-11-24 ENCOUNTER — Emergency Department (HOSPITAL_COMMUNITY)
Admission: EM | Admit: 2012-11-24 | Discharge: 2012-11-24 | Disposition: A | Discharge status: Home / Self Care | Payer: Medicare Other | Attending: Emergency Medicine | Admitting: Emergency Medicine

## 2012-11-24 ENCOUNTER — Encounter (HOSPITAL_COMMUNITY): Payer: Self-pay | Admitting: Emergency Medicine

## 2012-11-24 DIAGNOSIS — E7800 Pure hypercholesterolemia, unspecified: Secondary | ICD-10-CM | POA: Insufficient documentation

## 2012-11-24 DIAGNOSIS — I509 Heart failure, unspecified: Secondary | ICD-10-CM | POA: Insufficient documentation

## 2012-11-24 DIAGNOSIS — E1149 Type 2 diabetes mellitus with other diabetic neurological complication: Secondary | ICD-10-CM | POA: Insufficient documentation

## 2012-11-24 DIAGNOSIS — R5381 Other malaise: Secondary | ICD-10-CM | POA: Insufficient documentation

## 2012-11-24 DIAGNOSIS — Z87448 Personal history of other diseases of urinary system: Secondary | ICD-10-CM | POA: Insufficient documentation

## 2012-11-24 DIAGNOSIS — F411 Generalized anxiety disorder: Secondary | ICD-10-CM | POA: Insufficient documentation

## 2012-11-24 DIAGNOSIS — Z86711 Personal history of pulmonary embolism: Secondary | ICD-10-CM | POA: Insufficient documentation

## 2012-11-24 DIAGNOSIS — H5440 Blindness, one eye, unspecified eye: Secondary | ICD-10-CM | POA: Insufficient documentation

## 2012-11-24 DIAGNOSIS — Z87891 Personal history of nicotine dependence: Secondary | ICD-10-CM | POA: Insufficient documentation

## 2012-11-24 DIAGNOSIS — G8929 Other chronic pain: Secondary | ICD-10-CM | POA: Insufficient documentation

## 2012-11-24 DIAGNOSIS — Z794 Long term (current) use of insulin: Secondary | ICD-10-CM | POA: Insufficient documentation

## 2012-11-24 DIAGNOSIS — E1139 Type 2 diabetes mellitus with other diabetic ophthalmic complication: Secondary | ICD-10-CM | POA: Insufficient documentation

## 2012-11-24 DIAGNOSIS — M1990 Unspecified osteoarthritis, unspecified site: Secondary | ICD-10-CM | POA: Insufficient documentation

## 2012-11-24 DIAGNOSIS — I10 Essential (primary) hypertension: Secondary | ICD-10-CM | POA: Insufficient documentation

## 2012-11-24 DIAGNOSIS — E1142 Type 2 diabetes mellitus with diabetic polyneuropathy: Secondary | ICD-10-CM | POA: Insufficient documentation

## 2012-11-24 DIAGNOSIS — M6281 Muscle weakness (generalized): Secondary | ICD-10-CM

## 2012-11-24 DIAGNOSIS — Z79899 Other long term (current) drug therapy: Secondary | ICD-10-CM | POA: Insufficient documentation

## 2012-11-24 DIAGNOSIS — Z8546 Personal history of malignant neoplasm of prostate: Secondary | ICD-10-CM | POA: Insufficient documentation

## 2012-11-24 DIAGNOSIS — E11319 Type 2 diabetes mellitus with unspecified diabetic retinopathy without macular edema: Secondary | ICD-10-CM | POA: Insufficient documentation

## 2012-11-24 DIAGNOSIS — Z862 Personal history of diseases of the blood and blood-forming organs and certain disorders involving the immune mechanism: Secondary | ICD-10-CM | POA: Insufficient documentation

## 2012-11-24 DIAGNOSIS — M25569 Pain in unspecified knee: Secondary | ICD-10-CM | POA: Insufficient documentation

## 2012-11-24 DIAGNOSIS — J45909 Unspecified asthma, uncomplicated: Secondary | ICD-10-CM | POA: Insufficient documentation

## 2012-11-24 LAB — CBC WITH DIFFERENTIAL/PLATELET
Basophils Relative: 0 % (ref 0–1)
Eosinophils Absolute: 0.1 10*3/uL (ref 0.0–0.7)
Lymphs Abs: 1.1 10*3/uL (ref 0.7–4.0)
MCH: 29.6 pg (ref 26.0–34.0)
MCHC: 32.8 g/dL (ref 30.0–36.0)
Monocytes Absolute: 0.4 10*3/uL (ref 0.1–1.0)
Neutrophils Relative %: 71 % (ref 43–77)
Platelets: 152 10*3/uL (ref 150–400)

## 2012-11-24 LAB — BASIC METABOLIC PANEL
BUN: 19 mg/dL (ref 6–23)
GFR calc Af Amer: 54 mL/min — ABNORMAL LOW (ref >90)
GFR calc non Af Amer: 46 mL/min — ABNORMAL LOW (ref >90)
Glucose, Bld: 137 mg/dL — ABNORMAL HIGH (ref 70–99)
Potassium: 4.2 mEq/L (ref 3.5–5.1)
Sodium: 141 mEq/L (ref 135–145)

## 2012-11-24 LAB — URINALYSIS, ROUTINE W REFLEX MICROSCOPIC
Bilirubin Urine: NEGATIVE
Nitrite: NEGATIVE
Protein, ur: 100 mg/dL — AB
Specific Gravity, Urine: 1.015 (ref 1.005–1.030)
Urobilinogen, UA: 1 mg/dL (ref 0.0–1.0)

## 2012-11-24 LAB — PROTIME-INR
INR: 1.92 — ABNORMAL HIGH (ref 0.00–1.49)
Prothrombin Time: 21.4 seconds — ABNORMAL HIGH (ref 11.6–15.2)

## 2012-11-24 LAB — URINE MICROSCOPIC-ADD ON

## 2012-11-24 MED ORDER — OXYCODONE-ACETAMINOPHEN 5-325 MG PO TABS
2.0000 | ORAL_TABLET | Freq: Once | ORAL | Status: AC
  Administered 2012-11-24: 2 via ORAL
  Filled 2012-11-24: qty 2

## 2012-11-24 MED ORDER — OXYCODONE-ACETAMINOPHEN 5-325 MG PO TABS: 1.0000 | ORAL_TABLET | ORAL | Status: DC | PRN

## 2012-11-24 NOTE — ED Provider Notes (Signed)
CSN: [id number]     Arrival date & time 11/24/12  1216 History   First MD Initiated Contact with Patient 11/24/12 1217     Chief Complaint  Patient presents with  . Weakness   (Consider location/radiation/quality/duration/timing/severity/associated sxs/prior Treatment) The history is provided by the patient and medical records.   This is a 67 year old male with extensive past medical history, presenting to the ED for generalized weakness for the past 2 days, mostly of his LE. Patient states at baseline he walks assisted with a walker. Upon attempting to stand this morning he states he fell his legs could not support his weight, he slid down to the floor. He denies any injury or head trauma.  States his knees "just gave out."  Patient denies any abdominal pain, nausea, vomiting, or diarrhea. No chest pain, shortness, palpitations, cough or cold symptoms. Patient states he has been eating as normal. He states he has been using a sleep aid at night, but states he still will only sleep approx 3-4 hours at night.  Pt admits to chronic knee pain unchanged from baseline.  VS stable on arrival.  Past Medical History  Diagnosis Date  . ANXIETY 07/23/2006  . ASTHMA 07/23/2006  . DM 05/23/2007  . HYPERTENSION 07/23/2006  . OSTEOARTHRITIS 07/23/2006  . HYPERCHOLESTEROLEMIA 05/23/2007  . ANEMIA 08/31/2008  . Palpitations 02/10/2008  . ALCOHOL ABUSE, IN REMISSION, HX OF 08/26/2008  . TOBACCO ABUSE, HX OF 08/26/2008  . Pulmonary embolism 03/16/2010  . ED (erectile dysfunction)   . Morbid obesity   . DM retinopathy   . 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 DM neuropathy with neurologic complication   . CHF (congestive heart failure)     With a preserved EF  . 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 Sleep apnea   . Shortness of breath   . CARCINOMA, PROSTATE 09/29/2009   Past Surgical History  Procedure Laterality Date  . Back  surgery    . Tracheostomy     Family History  Problem Relation Age of Onset  . Cancer Neg Hx    History  Substance Use Topics  . Smoking status: Former Smoker    Quit date: 01/16/2005  . Smokeless tobacco: Not on file  . Alcohol Use: No    Review of Systems  Musculoskeletal: Positive for arthralgias.  Neurological: Positive for weakness.  All other systems reviewed and are negative.    Allergies  Review of patient's allergies indicates no known allergies.  Home Medications   Current Outpatient Rx  Name  Route  Sig  Dispense  Refill  . acetaminophen (TYLENOL) 500 MG tablet   Oral   Take 1 tablet (500 mg total) by mouth every 6 (six) hours as needed for pain.   30 tablet   0   . Alcohol Swabs (ALCOHOL PREP) 70 % PADS               . alprazolam (XANAX) 2 MG tablet      2 tabs at bedtime   60 tablet   4   . amLODipine (NORVASC) 5 MG tablet   Oral   Take 1 tablet (5 mg total) by mouth daily.   90 tablet   3   . carvedilol (COREG) 6.25 MG tablet   Oral   Take 1 tablet (6.25 mg total) by mouth 2 (two) times daily with  a meal.   60 tablet   4   . citalopram (CELEXA) 40 MG tablet   Oral   Take 1 tablet (40 mg total) by mouth daily.   30 tablet   4   . cloNIDine (CATAPRES) 0.1 MG tablet      TAKE ONE TABLET BY MOUTH TWICE DAILY   60 tablet   0   . EASY COMFORT INSULIN SYRINGE 30G X 5/16" 1 ML MISC               . furosemide (LASIX) 40 MG tablet      TAKE ONE TABLET BY MOUTH TWICE DAILY   60 tablet   10   . HYDROcodone-acetaminophen (NORCO) 10-325 MG per tablet   Oral   Take 1 tablet by mouth every 4 (four) hours as needed for pain.   100 tablet   0   . insulin glargine (LANTUS) 100 UNIT/ML injection   Subcutaneous   Inject 25 Units into the skin every morning.   10 mL   3   . lactulose (CHRONULAC) 10 GM/15ML solution   Oral   Take 30 mLs (20 g total) by mouth 2 (two) times daily.   240 mL   11   . metoCLOPramide (REGLAN) 5 MG  tablet      TAKE ONE TABLET BY MOUTH THREE TIMES DAILY   90 tablet   0   . Multiple Vitamin (MULTIVITAMIN) tablet   Oral   Take 1 tablet by mouth daily.           Marland Kitchen olmesartan (BENICAR) 40 MG tablet      TAKE ONE TABLET BY MOUTH EVERY DAY   90 tablet   1   . polyethylene glycol (MIRALAX / GLYCOLAX) packet   Oral   Take 17 g by mouth daily as needed (constipation).         . potassium chloride SA (K-DUR,KLOR-CON) 20 MEQ tablet   Oral   Take 2 tablets (40 mEq total) by mouth daily.   60 tablet   2   . RELION INSULIN SYR 1CC/30G 30G X 5/16" 1 ML MISC      USE AS DIRECTED   100 each   3   . simvastatin (ZOCOR) 20 MG tablet      TAKE ONE TABLET BY MOUTH EVERY DAY AT BEDTIME   30 tablet   2   . traMADol (ULTRAM) 50 MG tablet   Oral   Take 1 tablet (50 mg total) by mouth every 6 (six) hours as needed for pain.   30 tablet   5   . warfarin (COUMADIN) 10 MG tablet      Take as directed by Anticoagulation Clinic   45 tablet   3     1 month supply    BP 156/77  Pulse 70  Temp(Src) 98.2 F (36.8 C) (Oral)  Resp 19  Ht 6\' 2"  (1.88 m)  Wt 390 lb (176.903 kg)  BMI 50.05 kg/m2  SpO2 94%  Physical Exam  Nursing note and vitals reviewed. Constitutional: He is oriented to person, place, and time. No distress.  Morbidly obese  HENT:  Head: Normocephalic and atraumatic.  Mouth/Throat: Oropharynx is clear and moist.  Right eye absent  Eyes: Conjunctivae and EOM are normal. Pupils are equal, round, and reactive to light.  Neck: Normal range of motion. Neck supple.  Trach in place; no drainage or obstruction noted  Cardiovascular: Normal rate, regular rhythm and normal heart sounds.  Pulmonary/Chest: Effort normal and breath sounds normal. No respiratory distress. He has no wheezes.  Abdominal: Soft. Bowel sounds are normal. There is no tenderness.  Musculoskeletal: Normal range of motion. He exhibits no edema.  Neurological: He is alert and oriented to  person, place, and time. He has normal strength. He displays no tremor. No cranial nerve deficit or sensory deficit. He displays no seizure activity.  CN grossly intact, moves all extremities appropriately without ataxia, equal strength BUE and BLE; no focal neuro deficits or facial droop appreciated  Skin: Skin is warm and dry. He is not diaphoretic.  Psychiatric: He has a normal mood and affect. His speech is normal.    ED Course  Procedures (including critical care time) Labs Review Labs Reviewed  CBC WITH DIFFERENTIAL - Abnormal; Notable for the following:    Hemoglobin 12.5 (*)    HCT 38.1 (*)    All other components within normal limits  BASIC METABOLIC PANEL - Abnormal; Notable for the following:    Glucose, Bld 137 (*)    Creatinine, Ser 1.51 (*)    GFR calc non Af Amer 46 (*)    GFR calc Af Amer 54 (*)    All other components within normal limits  URINALYSIS, ROUTINE W REFLEX MICROSCOPIC - Abnormal; Notable for the following:    Protein, ur 100 (*)    All other components within normal limits  PROTIME-INR - Abnormal; Notable for the following:    Prothrombin Time 21.4 (*)    INR 1.92 (*)    All other components within normal limits  PRO B NATRIURETIC PEPTIDE - Abnormal; Notable for the following:    Pro B Natriuretic peptide (BNP) 264.4 (*)    All other components within normal limits  TROPONIN I  URINE MICROSCOPIC-ADD ON  CG4 I-STAT (LACTIC ACID)   Imaging Review Dg Chest 2 View  11/24/2012   CLINICAL DATA:  Weakness, knee pain  EXAM: CHEST  2 VIEW  COMPARISON:  03/23/2012  FINDINGS: Significant cardiomegaly with progression from prior exam. Tracheostomy tube in place. No pulmonary edema. Mild left basilar atelectasis. Mild degenerative changes thoracic spine.  IMPRESSION: Significant cardiomegaly with progression from prior exam. No pulmonary edema. Mild left basilar atelectasis. Tracheostomy tube in place.   Electronically Signed   By: Natasha Mead M.D.   On: 11/24/2012  13:23   Dg Knee Complete 4 Views Left  11/24/2012   CLINICAL DATA:  Left knee pain.  EXAM: LEFT KNEE - COMPLETE 4+ VIEW  COMPARISON:  None.  FINDINGS: There are severe tricompartmental degenerative changes with joint space narrowing, osteophytic spurring and bony eburnation. 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 No acute fracture or obvious joint effusion.  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  No acute bony findings.   Electronically Signed   By: Loralie Champagne M.D.   On: 11/24/2012 18:46   Dg Knee Complete 4 Views Right  11/24/2012   CLINICAL DATA:  Knee pain for 1 week, no injury  EXAM: RIGHT KNEE - COMPLETE 4+ VIEW  COMPARISON:  None.  FINDINGS: No fracture or dislocation. Severe narrowing and osteophyte formation of the medial compartment. Moderate narrowing and osteophyte formation of the lateral compartment. Moderate narrowing and osteophyte formation of the patellofemoral compartment. In the suprapatellar recess there is extensive soft tissue calcifications. This may represent a combination of loose  bodies or calcified center of itis.  IMPRESSION: Significant degenerative change. No acute findings.   Electronically Signed   By: Esperanza Heir M.D.   On: 11/24/2012 18:45    EKG Interpretation     Ventricular Rate:  69 PR Interval:  53 QRS Duration: 102 QT Interval:  457 QTC Calculation: 490 R Axis:   -47 Text Interpretation:  Sinus rhythm Ventricular premature complex Inferior infarct, old Anterior infarct, old Baseline wander in lead(s) I III aVL V1 V2 V3 V4 V5 V6            MDM   1. Knee pain, chronic, unspecified laterality   2. Muscle weakness of lower extremity    EKG normal sinus rhythm, no acute ischemic changes.  CXR clear.  Labs as above, no significant  abnormalities noted.    6:12 PM Have attempted to ambulate pt several times, he will take a few steps and state "i can't go anymore, my knee's are going to give out."  Do not feel that pt is putting forth his full effort but pts large body habitus is most definitely playing a role. Have explained to family that at this time there is no medical reason for admission.  Have already discussed with them that pt will likely need some PT and possible home health assistance.  Pts family remains unhappy with this discussion and have voiced complaints.  Will obtain plain films of both knees for further evaluation.  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  Pt states pain has improved after additional doses of percocet.  Referral to orthopedics given for further evaluation of knee findings.  Have also placed an order for face-to-face evaluation for pt evaluation to see if he qualifies for home health PT to help strengthen his legs.  Rx percocet.  FU with PCP as per normal routine.  Discussed plan with pt and family, they agreed.  Return precautions advised.  Discussed with Dr. Wilkie Aye who personally evaluated pt and agrees with assessment and plan of care.  Garlon Hatchet, PA-C 11/24/12 [id number]

## 2012-11-24 NOTE — ED Notes (Signed)
Patient transported to X-ray 

## 2012-11-24 NOTE — ED Notes (Signed)
Patient returned from X-ray 

## 2012-11-24 NOTE — ED Notes (Signed)
RT in room suctioning and assessing trach.

## 2012-11-24 NOTE — ED Notes (Signed)
Feeling weak in the legs for two days; usually gets around with walker, but increasingly weak and unable to stand today. Tried to get up from bed and slid to floor. Denies injury from fall. Denies pain. Denies N/V/D or fever; Reports no change in appetite or eating.

## 2012-11-24 NOTE — ED Notes (Addendum)
Attempted to stand at bedside; patient stated his knees were too weak to walk and sat back down. Notified Sharilyn Sites, PA. Patient sitting up on bedside; states he will stay sitting on bedside to see if he can feel more like moving. Have called ortho tech for walker (stated they don't have them, only PT, and they are not here), charge nurse and EDT looking for a walker to attempt to ambulate.

## 2012-11-24 NOTE — ED Notes (Signed)
Misty Stanley, [place] and this nurse attempted to ambulate patient again. Misty Stanley very encouraging and kind to the patient. Patient could only stand and sit back down. States he does not feel like his knees will hold him.

## 2012-11-24 NOTE — ED Notes (Signed)
Deep suctioned trach per patient request. Scant amount white sputum returned.

## 2012-11-24 NOTE — ED Notes (Signed)
Took about 3 steps with walker; then stated he could not go any further.

## 2012-11-25 ENCOUNTER — Inpatient Hospital Stay (HOSPITAL_COMMUNITY)
Admission: EM | Admit: 2012-11-25 | Discharge: 2012-11-28 | DRG: 948 | Disposition: A | Discharge status: Home Health | Payer: Medicare Other | Attending: Internal Medicine | Admitting: Internal Medicine

## 2012-11-25 ENCOUNTER — Encounter (HOSPITAL_COMMUNITY): Payer: Self-pay | Admitting: Emergency Medicine

## 2012-11-25 DIAGNOSIS — R635 Abnormal weight gain: Secondary | ICD-10-CM

## 2012-11-25 DIAGNOSIS — R002 Palpitations: Secondary | ICD-10-CM

## 2012-11-25 DIAGNOSIS — R29898 Other symptoms and signs involving the musculoskeletal system: Secondary | ICD-10-CM

## 2012-11-25 DIAGNOSIS — M545 Low back pain, unspecified: Secondary | ICD-10-CM

## 2012-11-25 DIAGNOSIS — I4729 Other ventricular tachycardia: Secondary | ICD-10-CM

## 2012-11-25 DIAGNOSIS — E662 Morbid (severe) obesity with alveolar hypoventilation: Secondary | ICD-10-CM | POA: Diagnosis present

## 2012-11-25 DIAGNOSIS — I2699 Other pulmonary embolism without acute cor pulmonale: Secondary | ICD-10-CM

## 2012-11-25 DIAGNOSIS — J9610 Chronic respiratory failure, unspecified whether with hypoxia or hypercapnia: Secondary | ICD-10-CM

## 2012-11-25 DIAGNOSIS — E1142 Type 2 diabetes mellitus with diabetic polyneuropathy: Secondary | ICD-10-CM | POA: Diagnosis present

## 2012-11-25 DIAGNOSIS — R0609 Other forms of dyspnea: Secondary | ICD-10-CM

## 2012-11-25 DIAGNOSIS — F1021 Alcohol dependence, in remission: Secondary | ICD-10-CM

## 2012-11-25 DIAGNOSIS — Z9911 Dependence on respirator [ventilator] status: Secondary | ICD-10-CM

## 2012-11-25 DIAGNOSIS — F411 Generalized anxiety disorder: Secondary | ICD-10-CM

## 2012-11-25 DIAGNOSIS — Z87891 Personal history of nicotine dependence: Secondary | ICD-10-CM

## 2012-11-25 DIAGNOSIS — J9690 Respiratory failure, unspecified, unspecified whether with hypoxia or hypercapnia: Secondary | ICD-10-CM | POA: Diagnosis present

## 2012-11-25 DIAGNOSIS — I472 Ventricular tachycardia: Secondary | ICD-10-CM

## 2012-11-25 DIAGNOSIS — I493 Ventricular premature depolarization: Secondary | ICD-10-CM

## 2012-11-25 DIAGNOSIS — Z86711 Personal history of pulmonary embolism: Secondary | ICD-10-CM

## 2012-11-25 DIAGNOSIS — N289 Disorder of kidney and ureter, unspecified: Secondary | ICD-10-CM

## 2012-11-25 DIAGNOSIS — E7800 Pure hypercholesterolemia, unspecified: Secondary | ICD-10-CM

## 2012-11-25 DIAGNOSIS — Z930 Tracheostomy status: Secondary | ICD-10-CM

## 2012-11-25 DIAGNOSIS — R296 Repeated falls: Secondary | ICD-10-CM

## 2012-11-25 DIAGNOSIS — E11319 Type 2 diabetes mellitus with unspecified diabetic retinopathy without macular edema: Secondary | ICD-10-CM | POA: Diagnosis present

## 2012-11-25 DIAGNOSIS — G4733 Obstructive sleep apnea (adult) (pediatric): Secondary | ICD-10-CM | POA: Diagnosis present

## 2012-11-25 DIAGNOSIS — I428 Other cardiomyopathies: Secondary | ICD-10-CM

## 2012-11-25 DIAGNOSIS — Z79899 Other long term (current) drug therapy: Secondary | ICD-10-CM

## 2012-11-25 DIAGNOSIS — R9431 Abnormal electrocardiogram [ECG] [EKG]: Secondary | ICD-10-CM

## 2012-11-25 DIAGNOSIS — I129 Hypertensive chronic kidney disease with stage 1 through stage 4 chronic kidney disease, or unspecified chronic kidney disease: Secondary | ICD-10-CM | POA: Diagnosis present

## 2012-11-25 DIAGNOSIS — N183 Chronic kidney disease, stage 3 unspecified: Secondary | ICD-10-CM | POA: Diagnosis present

## 2012-11-25 DIAGNOSIS — F1011 Alcohol abuse, in remission: Secondary | ICD-10-CM | POA: Diagnosis present

## 2012-11-25 DIAGNOSIS — E1029 Type 1 diabetes mellitus with other diabetic kidney complication: Secondary | ICD-10-CM | POA: Diagnosis present

## 2012-11-25 DIAGNOSIS — I5032 Chronic diastolic (congestive) heart failure: Secondary | ICD-10-CM | POA: Diagnosis present

## 2012-11-25 DIAGNOSIS — R5381 Other malaise: Principal | ICD-10-CM | POA: Diagnosis present

## 2012-11-25 DIAGNOSIS — Z794 Long term (current) use of insulin: Secondary | ICD-10-CM

## 2012-11-25 DIAGNOSIS — R262 Difficulty in walking, not elsewhere classified: Secondary | ICD-10-CM | POA: Diagnosis present

## 2012-11-25 DIAGNOSIS — E1049 Type 1 diabetes mellitus with other diabetic neurological complication: Secondary | ICD-10-CM | POA: Diagnosis present

## 2012-11-25 DIAGNOSIS — C61 Malignant neoplasm of prostate: Secondary | ICD-10-CM

## 2012-11-25 DIAGNOSIS — R531 Weakness: Secondary | ICD-10-CM

## 2012-11-25 DIAGNOSIS — I5030 Unspecified diastolic (congestive) heart failure: Secondary | ICD-10-CM

## 2012-11-25 DIAGNOSIS — Z6841 Body Mass Index (BMI) 40.0 and over, adult: Secondary | ICD-10-CM

## 2012-11-25 DIAGNOSIS — E785 Hyperlipidemia, unspecified: Secondary | ICD-10-CM | POA: Diagnosis present

## 2012-11-25 DIAGNOSIS — I5033 Acute on chronic diastolic (congestive) heart failure: Secondary | ICD-10-CM

## 2012-11-25 DIAGNOSIS — Z8546 Personal history of malignant neoplasm of prostate: Secondary | ICD-10-CM

## 2012-11-25 DIAGNOSIS — D649 Anemia, unspecified: Secondary | ICD-10-CM

## 2012-11-25 DIAGNOSIS — E1039 Type 1 diabetes mellitus with other diabetic ophthalmic complication: Secondary | ICD-10-CM | POA: Diagnosis present

## 2012-11-25 DIAGNOSIS — M1990 Unspecified osteoarthritis, unspecified site: Secondary | ICD-10-CM

## 2012-11-25 DIAGNOSIS — J45909 Unspecified asthma, uncomplicated: Secondary | ICD-10-CM

## 2012-11-25 DIAGNOSIS — Z7901 Long term (current) use of anticoagulants: Secondary | ICD-10-CM

## 2012-11-25 DIAGNOSIS — I509 Heart failure, unspecified: Secondary | ICD-10-CM | POA: Diagnosis present

## 2012-11-25 DIAGNOSIS — R0989 Other specified symptoms and signs involving the circulatory and respiratory systems: Secondary | ICD-10-CM

## 2012-11-25 DIAGNOSIS — R809 Proteinuria, unspecified: Secondary | ICD-10-CM

## 2012-11-25 DIAGNOSIS — I10 Essential (primary) hypertension: Secondary | ICD-10-CM

## 2012-11-25 LAB — CK: Total CK: 247 U/L — ABNORMAL HIGH (ref 7–232)

## 2012-11-25 LAB — BASIC METABOLIC PANEL
BUN: 19 mg/dL (ref 6–23)
CO2: 31 mEq/L (ref 19–32)
Chloride: 99 mEq/L (ref 96–112)
GFR calc Af Amer: 55 mL/min — ABNORMAL LOW (ref >90)
Potassium: 3.9 mEq/L (ref 3.5–5.1)
Sodium: 139 mEq/L (ref 135–145)

## 2012-11-25 LAB — CBC WITH DIFFERENTIAL/PLATELET
Basophils Absolute: 0 10*3/uL (ref 0.0–0.1)
Hemoglobin: 12.7 g/dL — ABNORMAL LOW (ref 13.0–17.0)
Lymphocytes Relative: 19 % (ref 12–46)
Lymphs Abs: 1.2 10*3/uL (ref 0.7–4.0)
MCV: 89.3 fL (ref 78.0–100.0)
Monocytes Relative: 7 % (ref 3–12)
Neutro Abs: 4.5 10*3/uL (ref 1.7–7.7)
Neutrophils Relative %: 72 % (ref 43–77)
Platelets: 154 10*3/uL (ref 150–400)
RBC: 4.29 MIL/uL (ref 4.22–5.81)
WBC: 6.2 10*3/uL (ref 4.0–10.5)

## 2012-11-25 MED ORDER — HYDROCODONE-ACETAMINOPHEN 5-325 MG PO TABS
2.0000 | ORAL_TABLET | Freq: Four times a day (QID) | ORAL | Status: DC | PRN
  Filled 2012-11-25: qty 2

## 2012-11-25 MED ORDER — HYDROCODONE-ACETAMINOPHEN 5-325 MG PO TABS
2.0000 | ORAL_TABLET | Freq: Once | ORAL | Status: AC
  Administered 2012-11-25: 2 via ORAL

## 2012-11-25 NOTE — ED Notes (Signed)
MRI contacted this RN regarding patients scan. Was informed pt is unable to be scanned due to weight of 390 lbs, scan can only accommodate 350 lbs. Dr. Jodi Mourning notified.

## 2012-11-25 NOTE — ED Provider Notes (Signed)
Medical screening examination/treatment/procedure(s) were conducted as a shared visit with non-physician practitioner(s) and myself.  I personally evaluated the patient during the encounter.  EKG Interpretation     Ventricular Rate:  69 PR Interval:  53 QRS Duration: 102 QT Interval:  457 QTC Calculation: 490 R Axis:   -47 Text Interpretation:  Sinus rhythm Ventricular premature complex Inferior infarct, old Anterior infarct, old Baseline wander in lead(s) I III aVL V1 V2 V3 V4 V5 V6            Patient presents with generalized weakness and bilateral knee pain and "giving out." Patient denies fevers.  Patient is nontoxic and nonfocal on exam.  He is morbidly obese and is slow to transfer.  Normal ROM of the bilateral knees.  Patient given pain medications and films obtained.  Films show synovial osteochondromatosis which is likely chronic in nature.  Multple attempts were made to ambulate the patient.  Will he showed poor effort, he was able to ambulate with a walker which is at his baseline.  Patient's family appears very frustrated that he will not be admitted.  Discussed with the patient and family that at this time he does not appear to have an emergent medical condition and he able to ambulate at his baseline.  Discussed with them the need for pain control, referral to orthopedics and referral back to PCP for PT and help with weight loss which is likely contributing to the patient's current condition.  After history, exam, and medical workup I feel the patient has been appropriately medically screened and is safe for discharge home. Pertinent diagnoses were discussed with the patient. Patient was given return precautions.   Shon Baton, MD 11/25/12 [phone]

## 2012-11-25 NOTE — ED Provider Notes (Signed)
CSN: [id number]     Arrival date & time 11/25/12  1831 History   First MD Initiated Contact with Patient 11/25/12 2227     Chief Complaint  Patient presents with  . Knee Pain   (Consider location/radiation/quality/duration/timing/severity/associated sxs/prior Treatment) HPI Comments: 67 yo male with trach, chf, PE, prostate CA presents with worsening lower leg weakness and pain to the point where he can hardly get out of bed.  He has a walker at home.  PCP sent pt back into ED, was seen yesterday.  No fevers, bowel/ bladder changes or back pain/ surgery hx.  Nothing improves. Pain in knees worse with movement.  Pt had another fall today, no head injury.   Patient is a 67 y.o. male presenting with knee pain. The history is provided by the patient.  Knee Pain Associated symptoms: fatigue   Associated symptoms: no back pain, no fever and no neck pain     Past Medical History  Diagnosis Date  . ANXIETY 07/23/2006  . ASTHMA 07/23/2006  . DM 05/23/2007  . HYPERTENSION 07/23/2006  . OSTEOARTHRITIS 07/23/2006  . HYPERCHOLESTEROLEMIA 05/23/2007  . ANEMIA 08/31/2008  . Palpitations 02/10/2008  . ALCOHOL ABUSE, IN REMISSION, HX OF 08/26/2008  . TOBACCO ABUSE, HX OF 08/26/2008  . Pulmonary embolism 03/16/2010  . ED (erectile dysfunction)   . Morbid obesity   . DM retinopathy   . 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 DM neuropathy with neurologic complication   . CHF (congestive heart failure)     With a preserved EF  . 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 Sleep apnea   . Shortness of breath   . CARCINOMA, PROSTATE 09/29/2009   Past Surgical History  Procedure Laterality Date  . Back surgery    . Tracheostomy     Family History  Problem Relation Age of Onset  . Cancer Neg Hx    History  Substance Use Topics  . Smoking status: Former Smoker    Quit date: 01/16/2005  . Smokeless tobacco: Not on file  .  Alcohol Use: No    Review of Systems  Constitutional: Positive for fatigue. Negative for fever and chills.  HENT: Negative for congestion.   Eyes: Negative for visual disturbance.  Respiratory: Negative for shortness of breath.   Cardiovascular: Negative for chest pain.  Gastrointestinal: Negative for vomiting and abdominal pain.  Genitourinary: Negative for dysuria and flank pain.  Musculoskeletal: Positive for gait problem. Negative for back pain, neck pain and neck stiffness.  Skin: Negative for rash.  Neurological: Positive for weakness. Negative for light-headedness and headaches.    Allergies  Review of patient's allergies indicates no known allergies.  Home Medications   Current Outpatient Rx  Name  Route  Sig  Dispense  Refill  . ALPRAZolam (XANAX XR) 3 MG 24 hr tablet   Oral   Take 3 mg by mouth daily.         Marland Kitchen amLODipine (NORVASC) 5 MG tablet   Oral   Take 1 tablet (5 mg total) by mouth daily.   90 tablet   3   . carvedilol (COREG) 6.25 MG tablet   Oral   Take 1 tablet (6.25 mg total) by mouth 2 (two) times daily with a meal.   60 tablet   4   . citalopram (CELEXA) 40 MG tablet   Oral  Take 1 tablet (40 mg total) by mouth daily.   30 tablet   4   . cloNIDine (CATAPRES) 0.1 MG tablet   Oral   Take 0.1 mg by mouth 2 (two) times daily.         . furosemide (LASIX) 40 MG tablet   Oral   Take 40 mg by mouth 2 (two) times daily.         . insulin glargine (LANTUS) 100 UNIT/ML injection   Subcutaneous   Inject 25 Units into the skin every morning.   10 mL   3   . lactulose (CHRONULAC) 10 GM/15ML solution   Oral   Take 30 mLs (20 g total) by mouth 2 (two) times daily.   240 mL   11   . metoCLOPramide (REGLAN) 5 MG tablet   Oral   Take 5 mg by mouth 3 (three) times daily.         . Multiple Vitamin (MULTIVITAMIN) tablet   Oral   Take 1 tablet by mouth daily.          Marland Kitchen olmesartan (BENICAR) 40 MG tablet   Oral   Take 40 mg by  mouth daily.         Marland Kitchen oxyCODONE-acetaminophen (PERCOCET/ROXICET) 5-325 MG per tablet   Oral   Take 1 tablet by mouth every 4 (four) hours as needed.   15 tablet   0   . potassium chloride SA (K-DUR,KLOR-CON) 20 MEQ tablet   Oral   Take 2 tablets (40 mEq total) by mouth daily.   60 tablet   2   . simvastatin (ZOCOR) 20 MG tablet   Oral   Take 20 mg by mouth daily.         Marland Kitchen warfarin (COUMADIN) 10 MG tablet   Oral   Take 5-10 mg by mouth See admin instructions. Take 10 mg on Monday, Wed, Friday and 5 mg on other days.          BP 159/81  Pulse 68  Temp(Src) 97.9 F (36.6 C) (Oral)  Resp 18  SpO2 95% Physical Exam  Nursing note and vitals reviewed. Constitutional: He is oriented to person, place, and time. He appears well-developed and well-nourished.  HENT:  Head: Normocephalic and atraumatic.  Eyes: Conjunctivae are normal. Right eye exhibits no discharge. Left eye exhibits no discharge.  Neck: Normal range of motion. Neck supple. No tracheal deviation present.  Cardiovascular: Normal rate and regular rhythm.   Pulmonary/Chest: Effort normal and breath sounds normal.  Abdominal: Soft. He exhibits no distension. There is no tenderness. There is no guarding.  Musculoskeletal: He exhibits tenderness. He exhibits no edema.  Tender anterior knee bilateral, no edema, full rom with mild pain  Neurological: He is alert and oriented to person, place, and time. No sensory deficit. GCS eye subscore is 4. GCS verbal subscore is 5. GCS motor subscore is 6.  Blind right eye, left eye eomfi Moves ext equal bilateral 4+ weakness bilateral LE Sensation to palpation intact 5+ strength bilateral UE  Skin: Skin is warm. No rash noted.  Psychiatric: He has a normal mood and affect.    ED Course  Procedures (including critical care time) Labs Review Labs Reviewed  CBC WITH DIFFERENTIAL - Abnormal; Notable for the following:    Hemoglobin 12.7 (*)    HCT 38.3 (*)    All other  components within normal limits  CK - Abnormal; Notable for the following:    Total CK 247 (*)  All other components within normal limits  BASIC METABOLIC PANEL - Abnormal; Notable for the following:    Glucose, Bld 136 (*)    Creatinine, Ser 1.48 (*)    GFR calc non Af Amer 48 (*)    GFR calc Af Amer 55 (*)    All other components within normal limits   Imaging Review Dg Chest 2 View  11/24/2012   CLINICAL DATA:  Weakness, knee pain  EXAM: CHEST  2 VIEW  COMPARISON:  03/23/2012  FINDINGS: Significant cardiomegaly with progression from prior exam. Tracheostomy tube in place. No pulmonary edema. Mild left basilar atelectasis. Mild degenerative changes thoracic spine.  IMPRESSION: Significant cardiomegaly with progression from prior exam. No pulmonary edema. Mild left basilar atelectasis. Tracheostomy tube in place.   Electronically Signed   By: Natasha Mead M.D.   On: 11/24/2012 13:23   Dg Knee Complete 4 Views Left  11/24/2012   CLINICAL DATA:  Left knee pain.  EXAM: LEFT KNEE - COMPLETE 4+ VIEW  COMPARISON:  None.  FINDINGS: There are severe tricompartmental degenerative changes with joint space narrowing, osteophytic spurring and bony eburnation. 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 No acute fracture or obvious joint effusion.  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  No acute bony findings.   Electronically Signed   By: Loralie Champagne M.D.   On: 11/24/2012 18:46   Dg Knee Complete 4 Views Right  11/24/2012   CLINICAL DATA:  Knee pain for 1 week, no injury  EXAM: RIGHT KNEE - COMPLETE 4+ VIEW  COMPARISON:  None.  FINDINGS: No fracture or dislocation. Severe narrowing and osteophyte formation of the medial compartment. Moderate narrowing and osteophyte formation of the lateral compartment.  Moderate narrowing and osteophyte formation of the patellofemoral compartment. In the suprapatellar recess there is extensive soft tissue calcifications. This may represent a combination of loose bodies or calcified center of itis.  IMPRESSION: Significant degenerative change. No acute findings.   Electronically Signed   By: Esperanza Heir M.D.   On: 11/24/2012 18:45    EKG Interpretation   None       MDM   1. Leg weakness, bilateral   2. Deconditioned low back   3. History of prostate cancer   4. Frequent falls   5. Chronic respiratory failure   6. Diastolic CHF, acute on chronic   7. Difficulty walking   8. OSA (obstructive sleep apnea)    Bounce back, worsening sxs.  Unable to obtain MRI due to weight.  Admission for further evaluation. Spoke with Child psychotherapist for assistance.  Spoke with [facility] for admission.  The patients results and plan were reviewed and discussed.   Any x-rays performed were personally reviewed by myself.   Differential diagnosis were considered with the presenting HPI.  Diagnosis: Leg weakness bilateral, Frequent falls, Deconditioned, see above  Admission/ observation were discussed with the admitting physician, patient and/or family and they are comfortable with the plan.      Enid Skeens, MD 11/26/12 [phone]

## 2012-11-25 NOTE — ED Notes (Signed)
Spoke with pt and his daughter who are agreeable to NHP in [place]  NHP process explained.  Unit CSW to initiate bed search.

## 2012-11-25 NOTE — ED Notes (Signed)
Phlebotomy at bedside.

## 2012-11-25 NOTE — ED Notes (Signed)
Per EMS: pt from home c/o chronic bilateral knee pain; pt seen here last night after fall; pt unable to stand on own but pt sts unrelated to fall; pt noted to have generalized weakness in legs

## 2012-11-25 NOTE — ED Notes (Signed)
Pt reports he fell this morning at 3am, EMS was called to pick him up. Denies LOC or head injury. Pt reports he is more concerned about the bilateral leg weakness as opposed to the pain.

## 2012-11-26 ENCOUNTER — Inpatient Hospital Stay (HOSPITAL_COMMUNITY): Payer: Medicare Other

## 2012-11-26 ENCOUNTER — Encounter (HOSPITAL_COMMUNITY): Payer: Self-pay | Admitting: Internal Medicine

## 2012-11-26 DIAGNOSIS — R262 Difficulty in walking, not elsewhere classified: Secondary | ICD-10-CM | POA: Diagnosis present

## 2012-11-26 DIAGNOSIS — R29898 Other symptoms and signs involving the musculoskeletal system: Secondary | ICD-10-CM

## 2012-11-26 LAB — COMPREHENSIVE METABOLIC PANEL
ALT: 11 U/L (ref 0–53)
AST: 20 U/L (ref 0–37)
BUN: 19 mg/dL (ref 6–23)
CO2: 29 mEq/L (ref 19–32)
Calcium: 8.8 mg/dL (ref 8.4–10.5)
Creatinine, Ser: 1.36 mg/dL — ABNORMAL HIGH (ref 0.50–1.35)
GFR calc Af Amer: 61 mL/min — ABNORMAL LOW (ref >90)
GFR calc non Af Amer: 53 mL/min — ABNORMAL LOW (ref >90)
Glucose, Bld: 185 mg/dL — ABNORMAL HIGH (ref 70–99)
Sodium: 139 mEq/L (ref 135–145)
Total Protein: 6.8 g/dL (ref 6.0–8.3)

## 2012-11-26 LAB — CBC WITH DIFFERENTIAL/PLATELET
Basophils Absolute: 0 10*3/uL (ref 0.0–0.1)
Basophils Relative: 0 % (ref 0–1)
Hemoglobin: 11.6 g/dL — ABNORMAL LOW (ref 13.0–17.0)
MCH: 29.3 pg (ref 26.0–34.0)
MCHC: 33 g/dL (ref 30.0–36.0)
Monocytes Absolute: 0.4 10*3/uL (ref 0.1–1.0)
Monocytes Relative: 8 % (ref 3–12)
Neutro Abs: 3.5 10*3/uL (ref 1.7–7.7)
Neutrophils Relative %: 64 % (ref 43–77)
Platelets: 148 10*3/uL — ABNORMAL LOW (ref 150–400)
WBC: 5.5 10*3/uL (ref 4.0–10.5)

## 2012-11-26 LAB — GLUCOSE, CAPILLARY
Glucose-Capillary: 179 mg/dL — ABNORMAL HIGH (ref 70–99)
Glucose-Capillary: 248 mg/dL — ABNORMAL HIGH (ref 70–99)

## 2012-11-26 LAB — PROTIME-INR
INR: 2.39 — ABNORMAL HIGH (ref 0.00–1.49)
Prothrombin Time: 25.3 seconds — ABNORMAL HIGH (ref 11.6–15.2)

## 2012-11-26 LAB — TSH: TSH: 1.459 u[IU]/mL (ref 0.350–4.500)

## 2012-11-26 MED ORDER — HYDRALAZINE HCL 20 MG/ML IJ SOLN
10.0000 mg | Freq: Once | INTRAMUSCULAR | Status: AC
  Administered 2012-11-26: 10 mg via INTRAVENOUS

## 2012-11-26 MED ORDER — ALPRAZOLAM 0.5 MG PO TABS
1.0000 mg | ORAL_TABLET | Freq: Three times a day (TID) | ORAL | Status: DC
  Administered 2012-11-26: 1 mg via ORAL
  Filled 2012-11-26: qty 2

## 2012-11-26 MED ORDER — INSULIN ASPART 100 UNIT/ML ~~LOC~~ SOLN
0.0000 [IU] | Freq: Three times a day (TID) | SUBCUTANEOUS | Status: DC
  Administered 2012-11-26: 2 [IU] via SUBCUTANEOUS
  Administered 2012-11-26: 3 [IU] via SUBCUTANEOUS
  Administered 2012-11-27: 2 [IU] via SUBCUTANEOUS
  Administered 2012-11-28: 1 [IU] via SUBCUTANEOUS
  Administered 2012-11-28: 3 [IU] via SUBCUTANEOUS

## 2012-11-26 MED ORDER — FUROSEMIDE 40 MG PO TABS
40.0000 mg | ORAL_TABLET | Freq: Two times a day (BID) | ORAL | Status: DC
  Administered 2012-11-26 – 2012-11-28 (×5): 40 mg via ORAL
  Filled 2012-11-26 (×7): qty 1

## 2012-11-26 MED ORDER — CITALOPRAM HYDROBROMIDE 20 MG PO TABS
20.0000 mg | ORAL_TABLET | Freq: Every day | ORAL | Status: DC
  Administered 2012-11-26 – 2012-11-27 (×2): 20 mg via ORAL
  Filled 2012-11-26 (×3): qty 1

## 2012-11-26 MED ORDER — AMLODIPINE BESYLATE 5 MG PO TABS
5.0000 mg | ORAL_TABLET | Freq: Every day | ORAL | Status: DC
  Administered 2012-11-26 – 2012-11-28 (×3): 5 mg via ORAL
  Filled 2012-11-26 (×3): qty 1

## 2012-11-26 MED ORDER — SODIUM CHLORIDE 0.9 % IJ SOLN
3.0000 mL | Freq: Two times a day (BID) | INTRAMUSCULAR | Status: DC
  Administered 2012-11-26 – 2012-11-27 (×4): 3 mL via INTRAVENOUS

## 2012-11-26 MED ORDER — POTASSIUM CHLORIDE CRYS ER 20 MEQ PO TBCR
40.0000 meq | EXTENDED_RELEASE_TABLET | Freq: Every day | ORAL | Status: DC
  Administered 2012-11-26 – 2012-11-28 (×3): 40 meq via ORAL
  Filled 2012-11-26 (×3): qty 2

## 2012-11-26 MED ORDER — OXYCODONE-ACETAMINOPHEN 5-325 MG PO TABS
1.0000 | ORAL_TABLET | ORAL | Status: DC | PRN
  Administered 2012-11-26 – 2012-11-28 (×4): 1 via ORAL
  Filled 2012-11-26 (×5): qty 1

## 2012-11-26 MED ORDER — CITALOPRAM HYDROBROMIDE 40 MG PO TABS
40.0000 mg | ORAL_TABLET | Freq: Every day | ORAL | Status: DC
  Filled 2012-11-26: qty 1

## 2012-11-26 MED ORDER — ACETAMINOPHEN 325 MG PO TABS: 650.0000 mg | ORAL_TABLET | Freq: Four times a day (QID) | ORAL | Status: DC | PRN

## 2012-11-26 MED ORDER — ALPRAZOLAM 0.5 MG PO TABS
3.0000 mg | ORAL_TABLET | Freq: Every evening | ORAL | Status: DC | PRN
  Administered 2012-11-26: 3 mg via ORAL
  Administered 2012-11-27: 1 mg via ORAL
  Filled 2012-11-26: qty 12
  Filled 2012-11-26: qty 6

## 2012-11-26 MED ORDER — WARFARIN SODIUM 5 MG PO TABS
5.0000 mg | ORAL_TABLET | ORAL | Status: DC
  Administered 2012-11-26: 5 mg via ORAL
  Filled 2012-11-26 (×3): qty 1

## 2012-11-26 MED ORDER — CHLORHEXIDINE GLUCONATE 0.12 % MT SOLN
OROMUCOSAL | Status: AC
  Filled 2012-11-26: qty 15

## 2012-11-26 MED ORDER — IRBESARTAN 300 MG PO TABS
300.0000 mg | ORAL_TABLET | Freq: Every day | ORAL | Status: DC
  Administered 2012-11-26 – 2012-11-28 (×3): 300 mg via ORAL
  Filled 2012-11-26 (×3): qty 1

## 2012-11-26 MED ORDER — ACETAMINOPHEN 650 MG RE SUPP: 650.0000 mg | Freq: Four times a day (QID) | RECTAL | Status: DC | PRN

## 2012-11-26 MED ORDER — ONDANSETRON HCL 4 MG/2ML IJ SOLN: 4.0000 mg | Freq: Four times a day (QID) | INTRAMUSCULAR | Status: DC | PRN

## 2012-11-26 MED ORDER — ALPRAZOLAM ER 1 MG PO TB24: 3.0000 mg | ORAL_TABLET | Freq: Every day | ORAL | Status: DC

## 2012-11-26 MED ORDER — WARFARIN SODIUM 10 MG PO TABS
10.0000 mg | ORAL_TABLET | ORAL | Status: DC
  Administered 2012-11-27: 10 mg via ORAL
  Filled 2012-11-26: qty 1

## 2012-11-26 MED ORDER — SODIUM CHLORIDE 0.9 % IJ SOLN
3.0000 mL | Freq: Two times a day (BID) | INTRAMUSCULAR | Status: DC
  Administered 2012-11-27 (×2): 3 mL via INTRAVENOUS

## 2012-11-26 MED ORDER — HYDRALAZINE HCL 20 MG/ML IJ SOLN
10.0000 mg | INTRAMUSCULAR | Status: DC | PRN
  Administered 2012-11-26: 10 mg via INTRAVENOUS
  Filled 2012-11-26 (×2): qty 1

## 2012-11-26 MED ORDER — ONDANSETRON HCL 4 MG PO TABS: 4.0000 mg | ORAL_TABLET | Freq: Four times a day (QID) | ORAL | Status: DC | PRN

## 2012-11-26 MED ORDER — CLONIDINE HCL 0.1 MG PO TABS
0.1000 mg | ORAL_TABLET | Freq: Two times a day (BID) | ORAL | Status: DC
Start: 2012-11-26 — End: 2012-11-28
  Administered 2012-11-26 – 2012-11-28 (×6): 0.1 mg via ORAL
  Filled 2012-11-26 (×8): qty 1

## 2012-11-26 MED ORDER — ADULT MULTIVITAMIN W/MINERALS CH
1.0000 | ORAL_TABLET | Freq: Every day | ORAL | Status: DC
  Administered 2012-11-26 – 2012-11-28 (×3): 1 via ORAL
  Filled 2012-11-26 (×3): qty 1

## 2012-11-26 MED ORDER — LACTULOSE 10 GM/15ML PO SOLN
20.0000 g | Freq: Two times a day (BID) | ORAL | Status: DC
  Administered 2012-11-26 (×2): 20 g via ORAL
  Filled 2012-11-26 (×6): qty 30

## 2012-11-26 MED ORDER — WARFARIN - PHARMACIST DOSING INPATIENT: Freq: Every day | Status: DC

## 2012-11-26 MED ORDER — CARVEDILOL 6.25 MG PO TABS
6.2500 mg | ORAL_TABLET | Freq: Two times a day (BID) | ORAL | Status: DC
  Administered 2012-11-26 – 2012-11-28 (×5): 6.25 mg via ORAL
  Filled 2012-11-26 (×7): qty 1

## 2012-11-26 MED ORDER — INSULIN GLARGINE 100 UNIT/ML ~~LOC~~ SOLN
25.0000 [IU] | Freq: Every morning | SUBCUTANEOUS | Status: DC
  Administered 2012-11-26 – 2012-11-28 (×3): 25 [IU] via SUBCUTANEOUS
  Filled 2012-11-26 (×3): qty 0.25

## 2012-11-26 MED ORDER — SIMVASTATIN 20 MG PO TABS
20.0000 mg | ORAL_TABLET | Freq: Every day | ORAL | Status: DC
  Filled 2012-11-26: qty 1

## 2012-11-26 MED ORDER — METOCLOPRAMIDE HCL 5 MG PO TABS
5.0000 mg | ORAL_TABLET | Freq: Three times a day (TID) | ORAL | Status: DC
  Administered 2012-11-26 – 2012-11-28 (×7): 5 mg via ORAL
  Filled 2012-11-26 (×9): qty 1

## 2012-11-26 MED ORDER — SIMVASTATIN 20 MG PO TABS
20.0000 mg | ORAL_TABLET | Freq: Every day | ORAL | Status: DC
  Administered 2012-11-26: 20 mg via ORAL
  Filled 2012-11-26 (×3): qty 1

## 2012-11-26 NOTE — Progress Notes (Signed)
Patient requesting ventilator QHS. There are no vent orders at this time, and this is patient's second night without vent. RN spoke with doctor and daughter will bring more info tomorrow concerning vent settings, etc. Patient VSS and resting well. RT will continue to monitor.

## 2012-11-26 NOTE — Progress Notes (Signed)
ANTICOAGULATION CONSULT NOTE - Follow Up Consult  Pharmacy Consult for Warfarin Indication: pulmonary embolus  No Known Allergies  Patient Measurements: Height: 6\' 2"  (188 cm) Weight: 445 lb 5.3 oz (202 kg) IBW/kg (Calculated) : 82.2  Vital Signs: Temp: 98.7 F (37.1 C) (11/11 0300) Temp src: Oral (11/11 0300) BP: 179/99 mmHg (11/11 0859) Pulse Rate: 73 (11/11 0859)  Labs:  Recent Labs  11/24/12 1250 11/25/12 2302 11/26/12 0600  HGB 12.5* 12.7* 11.6*  HCT 38.1* 38.3* 35.1*  PLT 152 154 148*  LABPROT 21.4*  --  25.3*  INR 1.92*  --  2.39*  CREATININE 1.51* 1.48* 1.36*  CKTOTAL  --  247*  --   TROPONINI <0.30  --   --     Estimated Creatinine Clearance: 98.3 ml/min (by C-G formula based on Cr of 1.36).   Medications:  Scheduled:  . ALPRAZolam  1 mg Oral Q8H  . amLODipine  5 mg Oral Daily  . carvedilol  6.25 mg Oral BID WC  . chlorhexidine      . citalopram  40 mg Oral Daily  . cloNIDine  0.1 mg Oral BID  . furosemide  40 mg Oral BID  . insulin aspart  0-9 Units Subcutaneous TID WC  . insulin glargine  25 Units Subcutaneous q morning - 10a  . irbesartan  300 mg Oral Daily  . lactulose  20 g Oral BID  . metoCLOPramide  5 mg Oral TID  . multivitamin with minerals  1 tablet Oral Daily  . potassium chloride SA  40 mEq Oral Daily  . simvastatin  20 mg Oral Daily  . sodium chloride  3 mL Intravenous Q12H  . sodium chloride  3 mL Intravenous Q12H  . Warfarin - Pharmacist Dosing Inpatient   Does not apply q1800    Assessment: 67yo male referred to ED by PCP for increasing difficulty ambulating d/t weakness, admitted for further workup, to continue Coumadin for h/o PE. Patient takes 10 mg MWF and 5mg  on all other days. INR therapeutic today, hgb & plt low. No signs of bleed noted.  Goal of Therapy:  INR 2-3 Monitor platelets by anticoagulation protocol: Yes   Plan:  - Restart patients home regimen - F/U on daily INR & CBC - Monitor for signs and symptoms of  bleed  Forestine Na M 11/26/2012,9:05 AM

## 2012-11-26 NOTE — Clinical Social Work Note (Signed)
RNCM asked pt if he would like to go to a vent SNF. Pt requested clinicals sent to [facility] vent [facility], as he has been there before. CSW submitted clinicals to [facility] and is waiting for response.   Maryclare Labrador, MSW, [facility] Clinical Social Worker [phone]

## 2012-11-26 NOTE — H&P (Signed)
[facility] History and Physical  Dago Jungwirth ZOX:[id number] DOB: 1945/12/04 DOA: 11/25/2012  Referring physician: ER physician. PCP: Romero Belling, MD   Chief Complaint: Difficulty walking.  HPI: Travis Chapman is a 67 y.o. male with multiple medical problems including morbid obesity, diabetes mellitus, history of PE on Coumadin, chronic CHF, OSA with tracheostomy was referred to the ER by patient's primary care as patient has been having increasing difficulty walking. Patient states that he's been feeling weak for last 3 days and has been having increasing falls. Patient had come to the ER yesterday and was discharged home. But at this time since patient is unable to ambulate without any have been admitted for further management and may need placement. Patient denies any chest pain shortness of breath nausea vomiting abdominal pain diarrhea. Did not lose consciousness. Last month patient states that he had weight gain and his primary care physician had increased his Lasix. X-rays of the knee does not show any fractures. Patient will be admitted for further management.   Review of Systems: As presented in the history of presenting illness, rest negative.  Past Medical History  Diagnosis Date  . ANXIETY 07/23/2006  . ASTHMA 07/23/2006  . DM 05/23/2007  . HYPERTENSION 07/23/2006  . OSTEOARTHRITIS 07/23/2006  . HYPERCHOLESTEROLEMIA 05/23/2007  . ANEMIA 08/31/2008  . Palpitations 02/10/2008  . ALCOHOL ABUSE, IN REMISSION, HX OF 08/26/2008  . TOBACCO ABUSE, HX OF 08/26/2008  . Pulmonary embolism 03/16/2010  . ED (erectile dysfunction)   . Morbid obesity   . DM retinopathy   . 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 DM neuropathy with neurologic complication   . CHF (congestive heart failure)     With a preserved EF  . 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 Sleep apnea   . Shortness of breath   . CARCINOMA, PROSTATE  09/29/2009   Past Surgical History  Procedure Laterality Date  . Back surgery    . Tracheostomy     Social History:  reports that he quit smoking about 7 years ago. He does not have any smokeless tobacco history on file. He reports that he does not drink alcohol. His drug history is not on file. Where does patient live home. Can patient participate in ADLs? Yes.  No Known Allergies  Family History:  Family History  Problem Relation Age of Onset  . Cancer Neg Hx       Prior to Admission medications   Medication Sig Start Date End Date Taking? Authorizing Provider  ALPRAZolam (XANAX XR) 3 MG 24 hr tablet Take 3 mg by mouth daily.    Historical Provider, MD  amLODipine (NORVASC) 5 MG tablet Take 1 tablet (5 mg total) by mouth daily. 11/06/12   Romero Belling, MD  carvedilol (COREG) 6.25 MG tablet Take 1 tablet (6.25 mg total) by mouth 2 (two) times daily with a meal. 08/28/12   Romero Belling, MD  citalopram (CELEXA) 40 MG tablet Take 1 tablet (40 mg total) by mouth daily. 11/18/12   Romero Belling, MD  cloNIDine (CATAPRES) 0.1 MG tablet Take 0.1 mg by mouth 2 (two) times daily.    Historical Provider, MD  furosemide (LASIX) 40 MG tablet Take 40 mg by mouth 2 (two) times daily.    Historical Provider, MD  insulin glargine (LANTUS) 100 UNIT/ML injection Inject 25 Units into the skin every morning. 02/27/12  Romero Belling, MD  lactulose (CHRONULAC) 10 GM/15ML solution Take 30 mLs (20 g total) by mouth 2 (two) times daily. 11/06/12   Romero Belling, MD  metoCLOPramide (REGLAN) 5 MG tablet Take 5 mg by mouth 3 (three) times daily.    Historical Provider, MD  Multiple Vitamin (MULTIVITAMIN) tablet Take 1 tablet by mouth daily.     Historical Provider, MD  olmesartan (BENICAR) 40 MG tablet Take 40 mg by mouth daily.    Historical Provider, MD  oxyCODONE-acetaminophen (PERCOCET/ROXICET) 5-325 MG per tablet Take 1 tablet by mouth every 4 (four) hours as needed. 11/24/12   Garlon Hatchet, PA-C  potassium  chloride SA (K-DUR,KLOR-CON) 20 MEQ tablet Take 2 tablets (40 mEq total) by mouth daily. 09/26/12   Romero Belling, MD  simvastatin (ZOCOR) 20 MG tablet Take 20 mg by mouth daily.    Historical Provider, MD  warfarin (COUMADIN) 10 MG tablet Take 5-10 mg by mouth See admin instructions. Take 10 mg on Monday, Wed, Friday and 5 mg on other days.    Historical Provider, MD    Physical Exam: Filed Vitals:   11/25/12 2145 11/25/12 2200 11/25/12 2230 11/25/12 2300  BP: 175/88 177/97 159/81 192/112  Pulse: 69 68 68 75  Temp:      TempSrc:      Resp:      SpO2: 97% 96% 95% 96%     General:  Well-developed and nourished.  Eyes: Anicteric no pallor. Right eye is blind.  ENT: No discharge from ears eyes nose mouth.  Neck: No mass felt.  Cardiovascular: S1-S2 heard.  Respiratory: No rhonchi or crepitations.  Abdomen: Soft nontender bowel sounds present.  Skin:  Chronic skin changes.  Musculoskeletal: No edema.  Psychiatric: Appears normal.  Neurologic: Alert awake oriented to time place and person. Moves all extremities.  Labs on Admission:  Basic Metabolic Panel:  Recent Labs Lab 11/24/12 1250 11/25/12 2302  NA 141 139  K 4.2 3.9  CL 101 99  CO2 31 31  GLUCOSE 137* 136*  BUN 19 19  CREATININE 1.51* 1.48*  CALCIUM 9.3 9.5   Liver Function Tests: No results found for this basename: AST, ALT, ALKPHOS, BILITOT, PROT, ALBUMIN,  in the last 168 hours No results found for this basename: LIPASE, AMYLASE,  in the last 168 hours No results found for this basename: AMMONIA,  in the last 168 hours CBC:  Recent Labs Lab 11/24/12 1250 11/25/12 2302  WBC 5.6 6.2  NEUTROABS 4.0 4.5  HGB 12.5* 12.7*  HCT 38.1* 38.3*  MCV 90.3 89.3  PLT 152 154   Cardiac Enzymes:  Recent Labs Lab 11/24/12 1250 11/25/12 2302  CKTOTAL  --  247*  TROPONINI <0.30  --     BNP (last 3 results)  Recent Labs  11/06/12 1640 11/24/12 1250  PROBNP 58.0 264.4*   CBG: No results found for  this basename: GLUCAP,  in the last 168 hours  Radiological Exams on Admission: Dg Chest 2 View  11/24/2012   CLINICAL DATA:  Weakness, knee pain  EXAM: CHEST  2 VIEW  COMPARISON:  03/23/2012  FINDINGS: Significant cardiomegaly with progression from prior exam. Tracheostomy tube in place. No pulmonary edema. Mild left basilar atelectasis. Mild degenerative changes thoracic spine.  IMPRESSION: Significant cardiomegaly with progression from prior exam. No pulmonary edema. Mild left basilar atelectasis. Tracheostomy tube in place.   Electronically Signed   By: Natasha Mead M.D.   On: 11/24/2012 13:23   Dg Knee Complete 4  Views Left  11/24/2012   CLINICAL DATA:  Left knee pain.  EXAM: LEFT KNEE - COMPLETE 4+ VIEW  COMPARISON:  None.  FINDINGS: There are severe tricompartmental degenerative changes with joint space narrowing, osteophytic spurring and bony eburnation. 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 No acute fracture or obvious joint effusion.  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  No acute bony findings.   Electronically Signed   By: Loralie Champagne M.D.   On: 11/24/2012 18:46   Dg Knee Complete 4 Views Right  11/24/2012   CLINICAL DATA:  Knee pain for 1 week, no injury  EXAM: RIGHT KNEE - COMPLETE 4+ VIEW  COMPARISON:  None.  FINDINGS: No fracture or dislocation. Severe narrowing and osteophyte formation of the medial compartment. Moderate narrowing and osteophyte formation of the lateral compartment. Moderate narrowing and osteophyte formation of the patellofemoral compartment. In the suprapatellar recess there is extensive soft tissue calcifications. This may represent a combination of loose bodies or calcified center of itis.  IMPRESSION: Significant degenerative change. No acute findings.    Electronically Signed   By: Esperanza Heir M.D.   On: 11/24/2012 18:45     Assessment/Plan Principal Problem:   Difficulty walking Active Problems:   Diastolic HF (heart failure)   OSA (obstructive sleep apnea)   Tracheostomy dependent   Type I (juvenile type) diabetes mellitus with renal manifestations, not stated as uncontrolled(250.41)   Chronic respiratory failure   1. Weakness with difficulty walking - probably secondary to deconditioning. Consult physical therapy and patient may need placement. 2. History of OSA and tracheostomy and on vent during nights - per respiratory. 3. History of CHF - continue with Lasix and closely follow intake output and metabolic panel. 4. Chronic kidney disease - closely follow intake output and metabolic panel. 5. Diabetes mellitus - closely follow CBGs. 6. History of PE on Coumadin - Coumadin per pharmacy.    Code Status: Full code.  Family Communication: Family at the bedside.  Disposition Plan: Admit to inpatient.    Sakshi Sermons N. [facility] Pager [phone].  If 7PM-7AM, please contact night-coverage [email or web address] Password [email or web address] 11/26/2012, 12:50 AM

## 2012-11-26 NOTE — Clinical Social Work Placement (Signed)
Clinical Social Work Department CLINICAL SOCIAL WORK PLACEMENT NOTE 11/26/2012  Patient:  Travis Chapman, Travis Chapman  Account Number:  [id number] Admit date:  11/25/2012  Clinical Social Worker:  Maryclare Labrador, Theresia Majors  Date/time:  11/26/2012 04:02 PM  Clinical Social Work is seeking post-discharge placement for this patient at the following level of care:   SKILLED NURSING   (*CSW will update this form in Epic as items are completed)   11/26/2012  Patient/family provided with [facility] Department of Clinical Social Work's list of facilities offering this level of care within the geographic area requested by the patient (or if unable, by the patient's family).  11/26/2012  Patient/family informed of their freedom to choose among providers that offer the needed level of care, that participate in Medicare, Medicaid or managed care program needed by the patient, have an available bed and are willing to accept the patient.  11/26/2012  Patient/family informed of [facility]' ownership interest in [facility], as well as of the fact that they are under no obligation to receive care at this facility.  PASARR submitted to EDS on 11/26/2012 PASARR number received from EDS on 11/26/2012  FL2 transmitted to all facilities in geographic area requested by pt/family on  11/26/2012 FL2 transmitted to all facilities within larger geographic area on   Patient informed that his/her managed care company has contracts with or will negotiate with  certain facilities, including the following:     Patient/family informed of bed offers received:   Patient chooses bed at  Physician recommends and patient chooses bed at    Patient to be transferred to  on   Patient to be transferred to facility by   The following physician request were entered in Epic:   Additional Comments:   Maryclare Labrador, MSW, [facility] Clinical Social Worker [phone]

## 2012-11-26 NOTE — Care Management Note (Addendum)
    Page 1 of 1   11/28/2012     3:01:58 PM   CARE MANAGEMENT NOTE 11/28/2012  Patient:  Travis Chapman, Travis Chapman   Account Number:  [id number]  Date Initiated:  11/26/2012  Documentation initiated by:  Junius Creamer  Subjective/Objective Assessment:   adm w htn crisis, difficulty walking     Action/Plan:   lives w fam,   In-house referral  Clinical Social Worker      DC Associate Professor  CM consult      Choice offered to / List presented to:  C-1 Patient      HH arranged  HH-1 RN  HH-2 PT  HH-3 OT  HH-4 NURSE'S AIDE      HH agency  [facility]   Status of service:  Completed, signed off  Per UR Regulation:  Reviewed for med. necessity/level of care/duration of stay  Comments:  11/28/12 1447 [facility] RN MSN BSN CCM No vent SNF beds available except [facility] in [place] - pt adamantly refuses.  TC to dtr, Amil Amen [phone]), she states pt's brother lives nearby and will be available to help during the day, pt's mother lives with him and provides meals, she or uncle will stay overnight with pt and his mother until he no longer needs them.  Pt to d/c home with home health services.  Provided list of agencies, referral made per choice.  11/26/12 1500 Montee Tallman RN MSN BSN CCM Discussed SNF for rehab, pt's choices limited to vent SNFs as others will not take him because of trach and nocturnal vent.  Pt and dtr are interested in [facility] - CSW to initiate bed search.  11/11 1029a debbie dowell rn,bsn spoke w pt and da. pt has trach. he is on vent at nite and he and fam handle. he gets trach supplies and vent from [facility]. he is thinking about snf short term for rehab. sw ref made.

## 2012-11-26 NOTE — Progress Notes (Signed)
[facility] PROGRESS NOTE  Travis Chapman WUJ:[id number] DOB: [birth year] DOA: 11/25/2012 PCP: Romero Belling, MD  Assessment/Plan: 1. Generalized weakness. Lab work unremarkable, with kidney function at baseline, CBC showed a normal white count with stable H&H. He denies nausea vomiting or diarrhea. I suspect this could be related to deconditioning. Will consult physical therapy, continue providing supportive care. 2. Hypertension. Patient's blood pressures remaining elevated with systolic blood pressures in the 170s to 180s. He is on multiple antihypertensive agents. Will increase his clonidine from 0.1 mg twice a day to 0.1 mg 3 times a day, increase amlodipine from 5-10 mg by mouth daily, irbesartan 300 mg by mouth daily. Continue carvedilol at 6.25 mg twice a day, provide when necessary hydralazine for systolic blood pressures were in the 160. 3. Obstructive sleep apnea/obesity hypoventilation syndrome. Status post tracheostomy placement, requiring ventilatory support at night. Continue monitoring in the step down unit. 4. History of pulmonary embolism, patient on chronic anticoagulation. Lab work showing therapeutic INR, continue Coumadin per pharmacy. 5. Stage III chronic kidney disease. Creatinine on a.m. Labs at 1.36. Appears to be at his baseline. 6. Type 2 diabetes mellitus. Continue Accu-Cheks q. A.c. And each bedtime with sliding scale coverage 7. Dyslipidemia. Continue simvastatin 8. Nutrition. Heart healthy diet 9. DVT prophylaxis. Patient fully anticoagulated  Code Status: full code Disposition Plan: physical therapy consultation pending   Consultants:  Physical therapy    HPI/Subjective: Patient is a pleasant 67 year old gentleman with an extensive past medical history including morbid obesity, obesity hypoventilation syndrome, status post tracheostomy placement, diabetes mellitus, hypertension, who was admitted overnight, presented to the emergency room with complaints  of generalized weakness, having recurrent falls at home. He was seen in the emergency room on the evening of 11/24/2012 after having a fall at home reporting "legs gave out" was discharged from the emergency department that evening, presented back overnight stating persistent recurrent falls and generalized weakness. Lab work unremarkable, remaining afebrile overnight.   Objective: Filed Vitals:   11/26/12 0859  BP: 179/99  Pulse: 73  Temp:   Resp: 18    Intake/Output Summary (Last 24 hours) at 11/26/12 1027 Last data filed at 11/26/12 9562  Gross per 24 hour  Intake    240 ml  Output    750 ml  Net   -510 ml   Filed Weights   11/26/12 0300  Weight: 202 kg (445 lb 5.3 oz)    Exam:   General:  Morbidly obese, in no acute distress. Status post tracheostomy placement  Cardiovascular: regular rate rhythm normal S1-S2 no murmurs or gallop  Respiratory: diminished breath sounds bilaterally, no wheezing rhonchi or rales  Abdomen: obese soft nontender nondistended  Musculoskeletal: patient having 1+ edema to lower extremities, hyperkeratosis noted to lower extremities.  Data Reviewed: Basic Metabolic Panel:  Recent Labs Lab 11/24/12 1250 11/25/12 2302 11/26/12 0600  NA 141 139 139  K 4.2 3.9 3.7  CL 101 99 100  CO2 31 31 29   GLUCOSE 137* 136* 185*  BUN 19 19 19   CREATININE 1.51* 1.48* 1.36*  CALCIUM 9.3 9.5 8.8   Liver Function Tests:  Recent Labs Lab 11/26/12 0600  AST 20  ALT 11  ALKPHOS 69  BILITOT 0.3  PROT 6.8  ALBUMIN 3.1*   No results found for this basename: LIPASE, AMYLASE,  in the last 168 hours No results found for this basename: AMMONIA,  in the last 168 hours CBC:  Recent Labs Lab 11/24/12 1250 11/25/12 2302 11/26/12 0600  WBC  5.6 6.2 5.5  NEUTROABS 4.0 4.5 3.5  HGB 12.5* 12.7* 11.6*  HCT 38.1* 38.3* 35.1*  MCV 90.3 89.3 88.6  PLT 152 154 148*   Cardiac Enzymes:  Recent Labs Lab 11/24/12 1250 11/25/12 2302  CKTOTAL  --  247*   TROPONINI <0.30  --    BNP (last 3 results)  Recent Labs  11/06/12 1640 11/24/12 1250 11/26/12 0600  PROBNP 58.0 264.4* 596.6*   CBG:  Recent Labs Lab 11/26/12 0301 11/26/12 0812  GLUCAP 165* 179*    Recent Results (from the past 240 hour(s))  MRSA PCR SCREENING     Status: None   Collection Time    11/26/12  3:41 AM      Result Value Range Status   MRSA by PCR NEGATIVE  NEGATIVE Final   Comment:            The GeneXpert MRSA Assay (FDA     approved for NASAL specimens     only), is one component of a     comprehensive MRSA colonization     surveillance program. It is not     intended to diagnose MRSA     infection nor to guide or     monitor treatment for     MRSA infections.     Studies: Dg Chest 2 View  11/24/2012   CLINICAL DATA:  Weakness, knee pain  EXAM: CHEST  2 VIEW  COMPARISON:  03/23/2012  FINDINGS: Significant cardiomegaly with progression from prior exam. Tracheostomy tube in place. No pulmonary edema. Mild left basilar atelectasis. Mild degenerative changes thoracic spine.  IMPRESSION: Significant cardiomegaly with progression from prior exam. No pulmonary edema. Mild left basilar atelectasis. Tracheostomy tube in place.   Electronically Signed   By: Natasha Mead M.D.   On: 11/24/2012 13:23   Dg Chest Port 1 View  11/26/2012   CLINICAL DATA:  Shortness of breath and cough.  EXAM: PORTABLE CHEST - 1 VIEW  COMPARISON:  Chest radiograph performed 11/24/2012  FINDINGS: The patient's tracheostomy tube is seen ending 6-7 cm above the carina.  The lungs are well-aerated. Vascular congestion is noted, with mildly increased interstitial markings, raising concern for mild interstitial edema. There is no evidence of pleural effusion or pneumothorax.  The cardiomediastinal silhouette is enlarged. No acute osseous abnormalities are seen.  IMPRESSION: Vascular congestion and cardiomegaly, with mildly increased interstitial markings, raising concern for mild interstitial  edema.   Electronically Signed   By: Roanna Raider M.D.   On: 11/26/2012 05:47   Dg Knee Complete 4 Views Left  11/24/2012   CLINICAL DATA:  Left knee pain.  EXAM: LEFT KNEE - COMPLETE 4+ VIEW  COMPARISON:  None.  FINDINGS: There are severe tricompartmental degenerative changes with joint space narrowing, osteophytic spurring and bony eburnation. 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 No acute fracture or obvious joint effusion.  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  No acute bony findings.   Electronically Signed   By: Loralie Champagne M.D.   On: 11/24/2012 18:46   Dg Knee Complete 4 Views Right  11/24/2012   CLINICAL DATA:  Knee pain for 1 week, no injury  EXAM: RIGHT KNEE - COMPLETE 4+ VIEW  COMPARISON:  None.  FINDINGS: No fracture or dislocation. Severe narrowing and osteophyte formation of the medial compartment. Moderate narrowing and osteophyte formation  of the lateral compartment. Moderate narrowing and osteophyte formation of the patellofemoral compartment. In the suprapatellar recess there is extensive soft tissue calcifications. This may represent a combination of loose bodies or calcified center of itis.  IMPRESSION: Significant degenerative change. No acute findings.   Electronically Signed   By: Esperanza Heir M.D.   On: 11/24/2012 18:45    Scheduled Meds: . ALPRAZolam  1 mg Oral Q8H  . amLODipine  5 mg Oral Daily  . carvedilol  6.25 mg Oral BID WC  . citalopram  40 mg Oral Daily  . cloNIDine  0.1 mg Oral BID  . furosemide  40 mg Oral BID  . insulin aspart  0-9 Units Subcutaneous TID WC  . insulin glargine  25 Units Subcutaneous q morning - 10a  . irbesartan  300 mg Oral Daily  . lactulose  20 g Oral BID  . metoCLOPramide  5 mg Oral TID  . multivitamin with minerals  1 tablet  Oral Daily  . potassium chloride SA  40 mEq Oral Daily  . simvastatin  20 mg Oral Daily  . sodium chloride  3 mL Intravenous Q12H  . sodium chloride  3 mL Intravenous Q12H  . [START ON 11/27/2012] warfarin  10 mg Oral Q M,W,F-1800   And  . warfarin  5 mg Oral Q T,Th,S,Su-1800  . Warfarin - Pharmacist Dosing Inpatient   Does not apply q1800   Continuous Infusions:   Principal Problem:   Difficulty walking Active Problems:   Diastolic HF (heart failure)   OSA (obstructive sleep apnea)   Tracheostomy dependent   Type I (juvenile type) diabetes mellitus with renal manifestations, not stated as uncontrolled(250.41)   Chronic respiratory failure    Time spent: 35 minutes   Jeralyn Bennett  [facility] Pager [phone]. If 7PM-7AM, please contact night-coverage at [email or web address], password [facility] 11/26/2012, 10:27 AM  LOS: 1 day

## 2012-11-26 NOTE — Progress Notes (Signed)
ANTICOAGULATION CONSULT NOTE - Initial Consult  Pharmacy Consult for Coumadin Indication: h/o PE  No Known Allergies  Patient Measurements: Height: 6\' 2"  (188 cm) Weight: 445 lb 5.3 oz (202 kg) IBW/kg (Calculated) : 82.2  Vital Signs: Temp: 98.7 F (37.1 C) (11/11 0300) Temp src: Oral (11/11 0300) BP: 177/92 mmHg (11/11 0300) Pulse Rate: 75 (11/11 0300)  Labs:  Recent Labs  11/24/12 1250 11/25/12 2302  HGB 12.5* 12.7*  HCT 38.1* 38.3*  PLT 152 154  LABPROT 21.4*  --   INR 1.92*  --   CREATININE 1.51* 1.48*  CKTOTAL  --  247*  TROPONINI <0.30  --     Estimated Creatinine Clearance: 90.3 ml/min (by C-G formula based on Cr of 1.48).   Medical History: Past Medical History  Diagnosis Date  . ANXIETY 07/23/2006  . ASTHMA 07/23/2006  . DM 05/23/2007  . HYPERTENSION 07/23/2006  . OSTEOARTHRITIS 07/23/2006  . HYPERCHOLESTEROLEMIA 05/23/2007  . ANEMIA 08/31/2008  . Palpitations 02/10/2008  . ALCOHOL ABUSE, IN REMISSION, HX OF 08/26/2008  . TOBACCO ABUSE, HX OF 08/26/2008  . Pulmonary embolism 03/16/2010  . ED (erectile dysfunction)   . Morbid obesity   . DM retinopathy   . 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 DM neuropathy with neurologic complication   . CHF (congestive heart failure)     With a preserved EF  . 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 Sleep apnea   . Shortness of breath   . CARCINOMA, PROSTATE 09/29/2009    Medications:  Prescriptions prior to admission  Medication Sig Dispense Refill  . ALPRAZolam (XANAX XR) 3 MG 24 hr tablet Take 3 mg by mouth daily.      Marland Kitchen amLODipine (NORVASC) 5 MG tablet Take 1 tablet (5 mg total) by mouth daily.  90 tablet  3  . carvedilol (COREG) 6.25 MG tablet Take 1 tablet (6.25 mg total) by mouth 2 (two) times daily with a meal.  60 tablet  4  . citalopram (CELEXA) 40 MG tablet Take 1 tablet (40 mg total) by mouth daily.  30 tablet  4  .  cloNIDine (CATAPRES) 0.1 MG tablet Take 0.1 mg by mouth 2 (two) times daily.      . furosemide (LASIX) 40 MG tablet Take 40 mg by mouth 2 (two) times daily.      . insulin glargine (LANTUS) 100 UNIT/ML injection Inject 25 Units into the skin every morning.  10 mL  3  . lactulose (CHRONULAC) 10 GM/15ML solution Take 30 mLs (20 g total) by mouth 2 (two) times daily.  240 mL  11  . metoCLOPramide (REGLAN) 5 MG tablet Take 5 mg by mouth 3 (three) times daily.      . Multiple Vitamin (MULTIVITAMIN) tablet Take 1 tablet by mouth daily.       Marland Kitchen olmesartan (BENICAR) 40 MG tablet Take 40 mg by mouth daily.      Marland Kitchen oxyCODONE-acetaminophen (PERCOCET/ROXICET) 5-325 MG per tablet Take 1 tablet by mouth every 4 (four) hours as needed.  15 tablet  0  . potassium chloride SA (K-DUR,KLOR-CON) 20 MEQ tablet Take 2 tablets (40 mEq total) by mouth daily.  60 tablet  2  . simvastatin (ZOCOR) 20 MG tablet Take 20 mg by mouth daily.      Marland Kitchen warfarin (COUMADIN) 10 MG tablet Take 5-10 mg by mouth See admin  instructions. Take 10 mg on Monday, Wed, Friday and 5 mg on other days.       Scheduled:  . ALPRAZolam  1 mg Oral Q8H  . amLODipine  5 mg Oral Daily  . carvedilol  6.25 mg Oral BID WC  . citalopram  40 mg Oral Daily  . cloNIDine  0.1 mg Oral BID  . furosemide  40 mg Oral BID  . insulin aspart  0-9 Units Subcutaneous TID WC  . insulin glargine  25 Units Subcutaneous q morning - 10a  . irbesartan  300 mg Oral Daily  . lactulose  20 g Oral BID  . metoCLOPramide  5 mg Oral TID  . multivitamin with minerals  1 tablet Oral Daily  . potassium chloride SA  40 mEq Oral Daily  . simvastatin  20 mg Oral Daily  . sodium chloride  3 mL Intravenous Q12H  . sodium chloride  3 mL Intravenous Q12H  . Warfarin - Pharmacist Dosing Inpatient   Does not apply q1800    Assessment: 67yo male referred to ED by PCP for increasing difficulty ambulating d/t weakness, admitted for further workup, to continue Coumadin for h/o  PE.  Goal of Therapy:  INR 2-3   Plan:  INR 2d ago was slightly subtherapeutic; will obtain updated INR and adjust dosing.  Vernard Gambles, PharmD, BCPS  11/26/2012,3:30 AM

## 2012-11-26 NOTE — Evaluation (Signed)
Physical Therapy Evaluation Patient Details Name: Travis Chapman MRN: [id number] DOB: 1945/12/14 Today's Date: 11/26/2012 Time: 4098-1191 PT Time Calculation (min): 24 min  PT Assessment / Plan / Recommendation History of Present Illness  Travis Chapman is a 67 y.o. male with multiple medical problems including morbid obesity, diabetes mellitus, history of PE on Coumadin, chronic CHF, OSA with tracheostomy was referred to the ER by patient's primary care as patient has been having increasing difficulty walking. Patient states that he's been feeling weak for last 3 days and has been having increasing falls. Patient had come to the ER yesterday and was discharged home. But at this time since patient is unable to ambulate without any have been admitted for further management and may need placement. Patient denies any chest pain shortness of breath nausea vomiting abdominal pain diarrhea  Clinical Impression  Pt admitted with the above. Pt currently with functional limitations due to the deficits listed below (see PT Problem List).  Pt would benefit from further therapy prior to d/c home for overall strength, mobility and ambulation.  Pt will benefit from skilled PT to increase their independence and safety with mobility to allow discharge to the venue listed below.      PT Assessment  Patient needs continued PT services    Follow Up Recommendations  SNF;Supervision/Assistance - 24 hour    Equipment Recommendations  None recommended by PT    Frequency Min 3X/week    Precautions / Restrictions Precautions Precautions: Fall Restrictions Weight Bearing Restrictions: No   Pertinent Vitals/Pain C/o bilateral knee pain but does not rate      Mobility  Bed Mobility Bed Mobility: Supine to Sit Supine to Sit: 4: Min assist;With rails;HOB elevated Details for Bed Mobility Assistance: (A) with shoulders OOB and overall stability.   Transfers Transfers: Sit to Stand;Stand to Sit;Stand Pivot  Transfers Sit to Stand: 1: +2 Total assist;From bed Sit to Stand: Patient Percentage: 70% Stand to Sit: 1: +2 Total assist;To chair/3-in-1 Stand to Sit: Patient Percentage: 70% Stand Pivot Transfers: 1: +2 Total assist;From elevated surface Stand Pivot Transfers: Patient Percentage: 70% Details for Transfer Assistance: +2 (A) to maintain balance.  Pt having difficulty advancing LE with attempt to ambulate.  Pt needed (A) to advance hips to recliner.  Pt attempted to stand once more after transfer and had difficulty reaching upright stance and return to sitting quickly.  Ambulation/Gait Ambulation/Gait Assistance: Not tested (comment) (attempted but pt unable to advance LEs.)    Exercises General Exercises - Lower Extremity Ankle Circles/Pumps: AROM;Both;10 reps Gluteal Sets: Strengthening;Both;5 reps Long Arc Quad: Strengthening;Both;10 reps Hip Flexion/Marching: Strengthening;Both;10 reps   PT Diagnosis: Difficulty walking;Generalized weakness;Acute pain  PT Problem List: Decreased strength;Decreased activity tolerance;Decreased balance;Decreased mobility;Decreased knowledge of use of DME;Obesity;Cardiopulmonary status limiting activity;Pain PT Treatment Interventions: DME instruction;Gait training;Stair training;Functional mobility training;Therapeutic activities;Therapeutic exercise;Balance training;Patient/family education     PT Goals(Current goals can be found in the care plan section) Acute Rehab PT Goals Patient Stated Goal: Pt did not set; however family wishes for pt to go SNF for further rehab prior to d/c home.  PT Goal Formulation: With patient/family Time For Goal Achievement: 12/10/12 Potential to Achieve Goals: Good  Visit Information  Last PT Received On: 11/26/12 Assistance Needed: +2 (due to girth) History of Present Illness: Travis Chapman is a 67 y.o. male with multiple medical problems including morbid obesity, diabetes mellitus, history of PE on Coumadin, chronic  CHF, OSA with tracheostomy was referred to the ER by patient's primary care as  patient has been having increasing difficulty walking. Patient states that he's been feeling weak for last 3 days and has been having increasing falls. Patient had come to the ER yesterday and was discharged home. But at this time since patient is unable to ambulate without any have been admitted for further management and may need placement. Patient denies any chest pain shortness of breath nausea vomiting abdominal pain diarrhea       Prior Functioning  Home Living Family/patient expects to be discharged to:: Private residence Living Arrangements: Parent Available Help at Discharge: Family Type of Home: House Home Access: Stairs to enter Secretary/administrator of Steps: 3 Entrance Stairs-Rails: Right;Left;Can reach both Home Layout: One level Home Equipment: Environmental consultant - 2 wheels;Other (comment) (vent, O2) Additional Comments: Pt wears vent at night time and has O2 just in case. Pt has been on vent for 8 years.  Prior Function Level of Independence: Independent with assistive device(s) Comments: Pt reports 2 falls in last week.  Communication Communication: Tracheostomy (chronic trach and able to communicate)    Cognition  Cognition Arousal/Alertness: Awake/alert Behavior During Therapy: WFL for tasks assessed/performed Overall Cognitive Status: Within Functional Limits for tasks assessed    Extremity/Trunk Assessment Lower Extremity Assessment Lower Extremity Assessment: Generalized weakness (c/o chronic bilateral LE knee pain)   Balance Balance Balance Assessed: Yes Static Sitting Balance Static Sitting - Balance Support: Feet supported Static Sitting - Level of Assistance: 5: Stand by assistance Static Sitting - Comment/# of Minutes: ~5 minutes while pt was able to rest due to fatigue prior to transfer Static Standing Balance Static Standing - Balance Support: Bilateral upper extremity  supported Static Standing - Level of Assistance: 1: +2 Total assist;Patient percentage (comment) (70%) Static Standing - Comment/# of Minutes: ~1 minutes & ~15 seconds.  Pt attempted to stand second time and unable to complete upright stance due to overall fatigue.   End of Session PT - End of Session Activity Tolerance: Patient limited by fatigue Patient left: in chair;with call bell/phone within reach Nurse Communication: Mobility status  GP     Travis Chapman 11/26/2012, 12:34 PM  Jake Shark, PT DPT [phone]

## 2012-11-26 NOTE — Clinical Social Work Note (Signed)
CSW called pt's daughter Amil Amen and explained that trach SNFs will not be able to offer pt a bed because he is on the nocturnal vent--though he is able to manage the vent independently, SNFs will not take on the liability (per Wandra Mannan, CSW supervisor). CSW offered to connect Amil Amen to [facility] who can arrange for home health to come provide services for pt a few times a week. Amil Amen says they have had [facility] for 8 years and that they like the services provided by this company. CSW alerted RNCM, who arranges for home health services.   Maryclare Labrador, MSW, [facility] Clinical Social Worker [phone]

## 2012-11-26 NOTE — ED Notes (Signed)
Waiting on Bariatric Bed to be brought to the patient's room before moving pt to room. Receiving RN to call ED when Bariatric bed is delivered.

## 2012-11-26 NOTE — Clinical Social Work Psychosocial (Signed)
Clinical Social Work Department BRIEF PSYCHOSOCIAL ASSESSMENT 11/26/2012  Patient:  Travis Chapman, Travis Chapman     Account Number:  [id number]     Admit date:  11/25/2012  Clinical Social Worker:  Illene Silver  Date/Time:  11/25/2012 11:28 PM  Referred by:  Physician  Date Referred:  11/25/2012 Referred for  SNF Placement   Other Referral:   Interview type:  Patient Other interview type:    PSYCHOSOCIAL DATA Living Status:  ALONE Admitted from facility:   Level of care:   Primary support name:  Travis Chapman Primary support relationship to patient:  CHILD, ADULT Degree of support available:   Good. Daughter with pt in ed, but unable to provide care for him at home.    CURRENT CONCERNS Current Concerns  Post-Acute Placement   Other Concerns:    SOCIAL WORK ASSESSMENT / PLAN Spoke with pt re: role of CSW/dcp.  Pt is unable to care for himself at home and is agreeable to SNF placement for rehab at d/c.  Pt interested in [employer]. facilities. Placement process explained.  Unit CSW to start bed search and provide bed offers.   Assessment/plan status:  Psychosocial Support/Ongoing Assessment of Needs Other assessment/ plan:   Information/referral to community resources:    PATIENT'S/FAMILY'S RESPONSE TO PLAN OF CARE: Daughter was silent during CSW visit.  Indicated by a head nod that she agreed with placement.  Pt not very talkative either.  CSW offered emotional support.       JODY DRAKE COMPLETED PSYCHOSOCIAL, STARTED IN [facility] BUT DID NOT COPY & PATE INTO EPIC. THIS CSW ([place] POSTING PSYCHOSOCIAL IN EPIC.

## 2012-11-27 DIAGNOSIS — Z9181 History of falling: Secondary | ICD-10-CM

## 2012-11-27 LAB — POCT I-STAT 3, ART BLOOD GAS (G3+)
Bicarbonate: 30.9 mEq/L — ABNORMAL HIGH (ref 20.0–24.0)
Patient temperature: 98.6
TCO2: 32 mmol/L (ref 0–100)
pCO2 arterial: 37.9 mmHg (ref 35.0–45.0)
pH, Arterial: 7.52 — ABNORMAL HIGH (ref 7.350–7.450)
pO2, Arterial: 157 mmHg — ABNORMAL HIGH (ref 80.0–100.0)

## 2012-11-27 LAB — BASIC METABOLIC PANEL
CO2: 28 mEq/L (ref 19–32)
Calcium: 8.9 mg/dL (ref 8.4–10.5)
Chloride: 99 mEq/L (ref 96–112)
GFR calc Af Amer: 56 mL/min — ABNORMAL LOW (ref >90)
Potassium: 3.7 mEq/L (ref 3.5–5.1)

## 2012-11-27 LAB — GLUCOSE, CAPILLARY
Glucose-Capillary: 158 mg/dL — ABNORMAL HIGH (ref 70–99)
Glucose-Capillary: 209 mg/dL — ABNORMAL HIGH (ref 70–99)
Glucose-Capillary: 179 mg/dL — ABNORMAL HIGH (ref 70–99)
Glucose-Capillary: 196 mg/dL — ABNORMAL HIGH (ref 70–99)
Glucose-Capillary: 147 mg/dL — ABNORMAL HIGH (ref 70–99)

## 2012-11-27 LAB — CBC
HCT: 34.5 % — ABNORMAL LOW (ref 39.0–52.0)
Hemoglobin: 11.5 g/dL — ABNORMAL LOW (ref 13.0–17.0)
MCV: 88 fL (ref 78.0–100.0)
RDW: 13.2 % (ref 11.5–15.5)
WBC: 5.9 10*3/uL (ref 4.0–10.5)

## 2012-11-27 LAB — PROTIME-INR
INR: 2.15 — ABNORMAL HIGH (ref 0.00–1.49)
Prothrombin Time: 23.3 seconds — ABNORMAL HIGH (ref 11.6–15.2)

## 2012-11-27 NOTE — Clinical Social Work Note (Signed)
CSW has not received a response from [facility] vent [facility]F--CSW left a voicemail for admissions rep to call CSW back and inform CSW if they can off pt a bed.   Maryclare Labrador, MSW, [facility] Clinical Social Worker [phone]

## 2012-11-27 NOTE — Clinical Social Work Note (Addendum)
CSW received call from [place], admissions rep with [facility]. Cassandra requested CSW fax clinicals to her at [phone]. CSW has done this.  Addendum: CSW received message that fax did not go through--CSW called Cassandra and told her fax didn't go through, asking her to call CSW back and provide accurate fax number or to ask if CSW can get clinicals to her through some other medium.  Addendum: CSW able to get clinicals through to [facility]. Cassandra called to confirm she is getting clinicals and will call CSW back with answer about bed availability.  Maryclare Labrador, MSW, [facility] Clinical Social Worker [phone]

## 2012-11-27 NOTE — Progress Notes (Signed)
[facility] PROGRESS NOTE  Travis Chapman NWG:[id number] DOB: Oct 02, 1945 DOA: 11/25/2012 PCP: Romero Belling, MD  Assessment/Plan: Principal Problem:  Difficulty walking -likely d/t Deconditioning, TSH is within normal limits - Await PT OT eval and further recs Active Problems:  Diastolic HF (heart failure)  -compensated, continue oral Lasix Chronic respiratory failure/s/p Tracheostomy - nocturnal vent dependence -CCM assisted with vent settings last p.m., will continue vent at current settings each bedtime -Respiratory status remaining stable off the vent this a.m., RT follwing Type I (juvenile type) diabetes mellitus with renal manifestations, not stated as uncontrolled(250.41) -Continue Lantus with sliding scale coverage, monitor CBGs and further adjust as appropriate OSA (obstructive sleep apnea)    Code Status: full Family Communication: Son-in-law at bedside Disposition Plan: Pending clinical course(needs to stay in step down given nocturnal vent dependence)    Consultants:  none  Procedures:  none  Antibiotics:  none  HPI/Subjective: Denies shortness of breath and cough, denies leg pain and no swelling  Objective: Filed Vitals:   11/27/12 0717  BP: 147/75  Pulse: 70  Temp: 98.4 F (36.9 C)  Resp: 19    Intake/Output Summary (Last 24 hours) at 11/27/12 5784 Last data filed at 11/27/12 6962  Gross per 24 hour  Intake    360 ml  Output    500 ml  Net   -140 ml   Filed Weights   11/26/12 0300 11/27/12 0418  Weight: 202 kg (445 lb 5.3 oz) 202 kg (445 lb 5.3 oz)    Exam:  General: alert & oriented x 3 In NAD, sitting up on side of bed with trach in place(off vent this a.m.) Cardiovascular: RRR, nl S1 s2 Respiratory: Decreased breath sounds at the bases, no crackles or wheezes Abdomen: soft +BS NT/ND, no masses palpable Extremities: No cyanosis, chronic skin changes/hyperkeratotic    Data Reviewed: Basic Metabolic Panel:  Recent Labs Lab  11/24/12 1250 11/25/12 2302 11/26/12 0600 11/27/12 0505  NA 141 139 139 138  K 4.2 3.9 3.7 3.7  CL 101 99 100 99  CO2 31 31 29 28   GLUCOSE 137* 136* 185* 153*  BUN 19 19 19 17   CREATININE 1.51* 1.48* 1.36* 1.45*  CALCIUM 9.3 9.5 8.8 8.9   Liver Function Tests:  Recent Labs Lab 11/26/12 0600  AST 20  ALT 11  ALKPHOS 69  BILITOT 0.3  PROT 6.8  ALBUMIN 3.1*   No results found for this basename: LIPASE, AMYLASE,  in the last 168 hours No results found for this basename: AMMONIA,  in the last 168 hours CBC:  Recent Labs Lab 11/24/12 1250 11/25/12 2302 11/26/12 0600 11/27/12 0505  WBC 5.6 6.2 5.5 5.9  NEUTROABS 4.0 4.5 3.5  --   HGB 12.5* 12.7* 11.6* 11.5*  HCT 38.1* 38.3* 35.1* 34.5*  MCV 90.3 89.3 88.6 88.0  PLT 152 154 148* 158   Cardiac Enzymes:  Recent Labs Lab 11/24/12 1250 11/25/12 2302  CKTOTAL  --  247*  TROPONINI <0.30  --    BNP (last 3 results)  Recent Labs  11/06/12 1640 11/24/12 1250 11/26/12 0600  PROBNP 58.0 264.4* 596.6*   CBG:  Recent Labs Lab 11/26/12 0812 11/26/12 1147 11/26/12 1635 11/26/12 2006 11/27/12 0725  GLUCAP 179* 216* 181* 248* 158*    Recent Results (from the past 240 hour(s))  MRSA PCR SCREENING     Status: None   Collection Time    11/26/12  3:41 AM      Result Value Range Status  MRSA by PCR NEGATIVE  NEGATIVE Final   Comment:            The GeneXpert MRSA Assay (FDA     approved for NASAL specimens     only), is one component of a     comprehensive MRSA colonization     surveillance program. It is not     intended to diagnose MRSA     infection nor to guide or     monitor treatment for     MRSA infections.     Studies: Dg Chest Port 1 View  11/26/2012   CLINICAL DATA:  Shortness of breath and cough.  EXAM: PORTABLE CHEST - 1 VIEW  COMPARISON:  Chest radiograph performed 11/24/2012  FINDINGS: The patient's tracheostomy tube is seen ending 6-7 cm above the carina.  The lungs are well-aerated.  Vascular congestion is noted, with mildly increased interstitial markings, raising concern for mild interstitial edema. There is no evidence of pleural effusion or pneumothorax.  The cardiomediastinal silhouette is enlarged. No acute osseous abnormalities are seen.  IMPRESSION: Vascular congestion and cardiomegaly, with mildly increased interstitial markings, raising concern for mild interstitial edema.   Electronically Signed   By: Roanna Raider M.D.   On: 11/26/2012 05:47    Scheduled Meds: . amLODipine  5 mg Oral Daily  . carvedilol  6.25 mg Oral BID WC  . citalopram  20 mg Oral Daily  . cloNIDine  0.1 mg Oral BID  . furosemide  40 mg Oral BID  . insulin aspart  0-9 Units Subcutaneous TID WC  . insulin glargine  25 Units Subcutaneous q morning - 10a  . irbesartan  300 mg Oral Daily  . lactulose  20 g Oral BID  . metoCLOPramide  5 mg Oral TID  . multivitamin with minerals  1 tablet Oral Daily  . potassium chloride SA  40 mEq Oral Daily  . simvastatin  20 mg Oral Daily  . sodium chloride  3 mL Intravenous Q12H  . sodium chloride  3 mL Intravenous Q12H  . warfarin  10 mg Oral Q M,W,F-1800   And  . warfarin  5 mg Oral Q T,Th,S,Su-1800  . Warfarin - Pharmacist Dosing Inpatient   Does not apply q1800   Continuous Infusions:   Principal Problem:   Difficulty walking Active Problems:   Diastolic HF (heart failure)   OSA (obstructive sleep apnea)   Tracheostomy dependent   Type I (juvenile type) diabetes mellitus with renal manifestations, not stated as uncontrolled(250.41)   Chronic respiratory failure    Time spent: 35    [facility]  [facility] Pager [phone]. If 7PM-7AM, please contact night-coverage at [email or web address], password [facility] 11/27/2012, 9:22 AM  LOS: 2 days

## 2012-11-27 NOTE — Progress Notes (Signed)
PT did not have extra trach in room. Ordered trach. RN aware.

## 2012-11-27 NOTE — Progress Notes (Signed)
RT will change.

## 2012-11-27 NOTE — Progress Notes (Addendum)
ANTICOAGULATION CONSULT NOTE - Follow Up Consult  Pharmacy Consult for Warfarin Indication: pulmonary embolus (history)  No Known Allergies  Patient Measurements: Height: 6\' 2"  (188 cm) Weight: 445 lb 5.3 oz (202 kg) IBW/kg (Calculated) : 82.2  Vital Signs: Temp: 98.4 F (36.9 C) (11/12 0717) Temp src: Oral (11/12 0717) BP: 147/75 mmHg (11/12 0717) Pulse Rate: 70 (11/12 0717)  Labs:  Recent Labs  11/24/12 1250 11/25/12 2302 11/26/12 0600 11/27/12 0505  HGB 12.5* 12.7* 11.6* 11.5*  HCT 38.1* 38.3* 35.1* 34.5*  PLT 152 154 148* 158  LABPROT 21.4*  --  25.3* 23.3*  INR 1.92*  --  2.39* 2.15*  CREATININE 1.51* 1.48* 1.36* 1.45*  CKTOTAL  --  247*  --   --   TROPONINI <0.30  --   --   --     Estimated Creatinine Clearance: 92.2 ml/min (by C-G formula based on Cr of 1.45).   Medications:  Scheduled:  . amLODipine  5 mg Oral Daily  . carvedilol  6.25 mg Oral BID WC  . citalopram  20 mg Oral Daily  . cloNIDine  0.1 mg Oral BID  . furosemide  40 mg Oral BID  . insulin aspart  0-9 Units Subcutaneous TID WC  . insulin glargine  25 Units Subcutaneous q morning - 10a  . irbesartan  300 mg Oral Daily  . lactulose  20 g Oral BID  . metoCLOPramide  5 mg Oral TID  . multivitamin with minerals  1 tablet Oral Daily  . potassium chloride SA  40 mEq Oral Daily  . simvastatin  20 mg Oral Daily  . sodium chloride  3 mL Intravenous Q12H  . sodium chloride  3 mL Intravenous Q12H  . warfarin  10 mg Oral Q M,W,F-1800   And  . warfarin  5 mg Oral Q T,Th,S,Su-1800  . Warfarin - Pharmacist Dosing Inpatient   Does not apply q1800    Assessment: 67yo male referred to ED by PCP for increasing difficulty ambulating d/t weakness. Hx of PE so he has been on coumadin. INR is 2.15 today on home dose. Will continue with the same dose.   Goal of Therapy:  INR 2-3 Monitor platelets by anticoagulation protocol: Yes   Plan:  - Coumadin 5mg  qday except 10mg  MWF - F/U on daily INR & prn  CBC - Monitor for signs and symptoms of bleed  Ulyses Southward [place] 11/27/2012,8:37 AM  Agree with above.  [employer], Pharm.D., BCPS Clinical Pharmacist Pager [phone] 11/27/2012 10:14 AM

## 2012-11-27 NOTE — Progress Notes (Signed)
Trach secured with securing device/ties. Site clean, dry, and no breakdown noted around trach.

## 2012-11-27 NOTE — Progress Notes (Signed)
Vt reduced from 650 to 550 per request after ABG results.

## 2012-11-27 NOTE — Progress Notes (Signed)
Trach secured, dry, no breakdown was noted around the [place]. Pt requested no drain sponge.

## 2012-11-27 NOTE — Progress Notes (Signed)
Comment:  Notified by RN that pt is vent dependent at night but there are no orders for vent to trache or vent settings. There is no one available in pt's family to get to pt's home tonight to get information regarding pt's vent settings at home. I spoke w/ Dr Darrick Penna w/ [facility] for guidance on appropriate vent mode and settings in this situation. She was happy to make recommendations and I placed the order. She recommended an ABG be done one hour after pt placed on vent to assure appropriate settings. Dr Deterding f/u w/ me after ABG resulted and reported pt somewhat alkalemic and recommended decreasing TV to 550 which RT has done. She did not think a follow up ABG would be necessary. At the time of this note pt has remained stable and has rested through-out the night in NAD.  Leanne Chang, NP-C [facility] Pager [phone]

## 2012-11-27 NOTE — Progress Notes (Signed)
Vent orders received and patient placed on ventilator. Will draw ABG in one hour.

## 2012-11-27 NOTE — Progress Notes (Signed)
Inpatient Diabetes Program Recommendations  AACE/ADA: New Consensus Statement on Inpatient Glycemic Control (2013)  Target Ranges:  Prepandial:   less than 140 mg/dL      Peak postprandial:   less than 180 mg/dL (1-2 hours)      Critically ill patients:  140 - 180 mg/dL  Results for MRK, BUZBY (MRN [id number]) as of 11/27/2012 11:40  Ref. Range 11/26/2012 08:12 11/26/2012 11:47 11/26/2012 16:35 11/26/2012 20:06 11/27/2012 07:25  Glucose-Capillary Latest Range: 70-99 mg/dL 409 (H) 811 (H) 914 (H) 248 (H) 158 (H)   Inpatient Diabetes Program Recommendations Correction (SSI): consider adding HS scale (starts at 200) Thank you  Piedad Climes BSN, RN,CDE Inpatient Diabetes Coordinator [phone] (team pager)

## 2012-11-28 DIAGNOSIS — I5030 Unspecified diastolic (congestive) heart failure: Secondary | ICD-10-CM

## 2012-11-28 LAB — BASIC METABOLIC PANEL
Calcium: 8.9 mg/dL (ref 8.4–10.5)
Chloride: 98 mEq/L (ref 96–112)
GFR calc Af Amer: 58 mL/min — ABNORMAL LOW (ref >90)
GFR calc non Af Amer: 50 mL/min — ABNORMAL LOW (ref >90)
Glucose, Bld: 177 mg/dL — ABNORMAL HIGH (ref 70–99)
Potassium: 4.1 mEq/L (ref 3.5–5.1)
Sodium: 136 mEq/L (ref 135–145)

## 2012-11-28 LAB — PROTIME-INR
INR: 2.28 — ABNORMAL HIGH (ref 0.00–1.49)
Prothrombin Time: 24.4 seconds — ABNORMAL HIGH (ref 11.6–15.2)

## 2012-11-28 LAB — GLUCOSE, CAPILLARY: Glucose-Capillary: 207 mg/dL — ABNORMAL HIGH (ref 70–99)

## 2012-11-28 MED ORDER — POTASSIUM CHLORIDE CRYS ER 20 MEQ PO TBCR: 20.0000 meq | EXTENDED_RELEASE_TABLET | Freq: Two times a day (BID) | ORAL | Status: DC

## 2012-11-28 MED ORDER — ALPRAZOLAM 2 MG PO TABS: 2.0000 mg | ORAL_TABLET | Freq: Every evening | ORAL | Status: DC | PRN

## 2012-11-28 MED ORDER — ALPRAZOLAM 0.5 MG PO TABS: 2.0000 mg | ORAL_TABLET | Freq: Every evening | ORAL | Status: DC | PRN

## 2012-11-28 NOTE — Progress Notes (Signed)
Physical Therapy Treatment Patient Details Name: Tarius Stangelo MRN: [id number] DOB: [birth year] Today's Date: 11/28/2012 Time: 4098-1191 PT Time Calculation (min): 18 min  PT Assessment / Plan / Recommendation  History of Present Illness Aydenn Gervin is a 67 y.o. male with multiple medical problems including morbid obesity, diabetes mellitus, history of PE on Coumadin, chronic CHF, OSA with tracheostomy was referred to the ER by patient's primary care as patient has been having increasing difficulty walking. Patient states that he's been feeling weak for last 3 days and has been having increasing falls. Patient had come to the ER yesterday and was discharged home. But at this time since patient is unable to ambulate without any have been admitted for further management and may need placement. Patient denies any chest pain shortness of breath nausea vomiting abdominal pain diarrhea   PT Comments   Pt admitted with above. Pt currently with functional limitations due to balance and endurance deficits.  Pt will benefit from skilled PT to increase their independence and safety with mobility to allow discharge to the venue listed below.   Follow Up Recommendations  SNF;Supervision/Assistance - 24 hour                 Equipment Recommendations  None recommended by PT        Frequency Min 3X/week   Progress towards PT Goals Progress towards PT goals: Progressing toward goals  Plan Current plan remains appropriate    Precautions / Restrictions Precautions Precautions: Fall Restrictions Weight Bearing Restrictions: No   Pertinent Vitals/Pain VSS, No pain    Mobility  Bed Mobility Bed Mobility: Not assessed Transfers Transfers: Sit to Stand;Stand to Sit Sit to Stand: With upper extremity assist;1: +2 Total assist;With armrests;From chair/3-in-1 Sit to Stand: Patient Percentage: 80% Stand to Sit: 1: +2 Total assist;With upper extremity assist;To chair/3-in-1;With armrests Stand to Sit:  Patient Percentage: 80% Stand Pivot Transfers: Not tested (comment) Details for Transfer Assistance: Pt needed cues for hand and feet placement.  Also needed cues for anterior lean to get up as pt needed to attempt several times to acheive.   Ambulation/Gait Ambulation/Gait Assistance: 4: Min assist Ambulation Distance (Feet): 100 Feet Assistive device: Rolling walker Ambulation/Gait Assistance Details: Pt needed cues to stay close to RW as well as for sequencing steps and RW. Noted pt shaky in UEs as he ambulated but was able to continue.  Gait overall slightly unsteady but pt did not have significant LOB.  Poor overall endurance.   Gait Pattern: Step-to pattern;Trunk flexed;Wide base of support Gait velocity: decreased Stairs: No Wheelchair Mobility Wheelchair Mobility: No      PT Goals (current goals can now be found in the care plan section)    Visit Information  Last PT Received On: 11/28/12 Assistance Needed: +2 (for chair follow) History of Present Illness: Fadel Clason is a 67 y.o. male with multiple medical problems including morbid obesity, diabetes mellitus, history of PE on Coumadin, chronic CHF, OSA with tracheostomy was referred to the ER by patient's primary care as patient has been having increasing difficulty walking. Patient states that he's been feeling weak for last 3 days and has been having increasing falls. Patient had come to the ER yesterday and was discharged home. But at this time since patient is unable to ambulate without any have been admitted for further management and may need placement. Patient denies any chest pain shortness of breath nausea vomiting abdominal pain diarrhea    Subjective Data  Subjective: "I feel like  I can walk."   Cognition  Cognition Arousal/Alertness: Awake/alert Behavior During Therapy: WFL for tasks assessed/performed Overall Cognitive Status: Within Functional Limits for tasks assessed    Balance  Static Sitting  Balance Static Sitting - Level of Assistance: Not tested (comment) Static Standing Balance Static Standing - Balance Support: Bilateral upper extremity supported;During functional activity Static Standing - Level of Assistance: 5: Stand by assistance Static Standing - Comment/# of Minutes: 2 min with RW.    End of Session PT - End of Session Equipment Utilized During Treatment: Gait belt Activity Tolerance: Patient limited by fatigue Patient left: in chair;with call bell/phone within reach Nurse Communication: Mobility status        INGOLD,Asusena Sigley 11/28/2012, 1:51 PM [facility] [phone] (pager)

## 2012-11-28 NOTE — Progress Notes (Signed)
ANTICOAGULATION CONSULT NOTE - Follow Up Consult  Pharmacy Consult for Warfarin Indication: pulmonary embolus (history)  No Known Allergies  Patient Measurements: Height: 6\' 2"  (188 cm) Weight: 445 lb 5.3 oz (202 kg) IBW/kg (Calculated) : 82.2  Vital Signs: Temp: 98.2 F (36.8 C) (11/13 0408) Temp src: Oral (11/13 0408) BP: 128/70 mmHg (11/13 0408) Pulse Rate: 62 (11/13 0408)  Labs:  Recent Labs  11/25/12 2302 11/26/12 0600 11/27/12 0505 11/28/12 0351  HGB 12.7* 11.6* 11.5*  --   HCT 38.3* 35.1* 34.5*  --   PLT 154 148* 158  --   LABPROT  --  25.3* 23.3* 24.4*  INR  --  2.39* 2.15* 2.28*  CREATININE 1.48* 1.36* 1.45* 1.42*  CKTOTAL 247*  --   --   --     Estimated Creatinine Clearance: 94.2 ml/min (by C-G formula based on Cr of 1.42).   Medications:  Scheduled:  . amLODipine  5 mg Oral Daily  . carvedilol  6.25 mg Oral BID WC  . citalopram  20 mg Oral Daily  . cloNIDine  0.1 mg Oral BID  . furosemide  40 mg Oral BID  . insulin aspart  0-9 Units Subcutaneous TID WC  . insulin glargine  25 Units Subcutaneous q morning - 10a  . irbesartan  300 mg Oral Daily  . lactulose  20 g Oral BID  . metoCLOPramide  5 mg Oral TID  . multivitamin with minerals  1 tablet Oral Daily  . potassium chloride SA  40 mEq Oral Daily  . simvastatin  20 mg Oral Daily  . sodium chloride  3 mL Intravenous Q12H  . sodium chloride  3 mL Intravenous Q12H  . warfarin  10 mg Oral Q M,W,F-1800   And  . warfarin  5 mg Oral Q T,Th,S,Su-1800  . Warfarin - Pharmacist Dosing Inpatient   Does not apply q1800    Assessment: 66yo male referred to ED by PCP for increasing difficulty ambulating d/t weakness. Hx of PE so he has been on coumadin. INR is 2.28 today on home dose. Will continue with the same dose.   Goal of Therapy:  INR 2-3 Monitor platelets by anticoagulation protocol: Yes   Plan:  - Coumadin 5mg  qday except 10mg  MWF - Change INR to MWF

## 2012-11-28 NOTE — Discharge Summary (Signed)
Physician Discharge Summary  Travis Chapman WUJ:[id number] DOB: 06-Dec-1945 DOA: 11/25/2012  PCP: Romero Belling, MD  Admit date: 11/25/2012 Discharge date: 11/28/2012  Time spent: >50minutes  Recommendations for Outpatient Follow-up:  Follow-up Information   Follow up with Romero Belling, MD. (in 1-2weeks, call for appt upon discharge)    Specialty:  Endocrinology   Contact information:   [address] [place] [address] [phone]        Discharge Diagnoses:  Principal Problem:   Difficulty walking Active Problems:   Diastolic HF (heart failure)   OSA (obstructive sleep apnea)   Tracheostomy dependent   Type I (juvenile type) diabetes mellitus with renal manifestations, not stated as uncontrolled(250.41)   Chronic respiratory failure   Discharge Condition: improved/stable  Diet recommendation: Modified carbohydrate  Filed Weights   11/26/12 0300 11/27/12 0418  Weight: 202 kg (445 lb 5.3 oz) 202 kg (445 lb 5.3 oz)    History of present illness:  The pt is a 67 y.o. male with multiple medical problems including morbid obesity, diabetes mellitus, history of PE on Coumadin, chronic CHF, OSA with tracheostomy was referred to the ER by patient's primary care as patient has been having increasing difficulty walking. Patient states that he's been feeling weak for 3 days PTA and has been having increasing falls. Patient had come to the ER yesterday and was discharged home. But at this time since patient is unable to ambulate without any have been admitted for further management and may need placement. Patient denied any chest pain shortness of breath nausea vomiting abdominal pain diarrhea. Did not lose consciousness. Last month patient states that he had weight gain and his primary care physician had increased his Lasix. X-rays of the knee done in the ED did not show any fractures. Patient was admitted for further evaluation and management.   Hospital Course:   Difficulty walking  As discussed above, upon admission was noted that patient had been seen in ED on 11/9 and workup included bilateral knee x-rays which came back negative for fractures, significant degenerative changes noted. Patient has also had urinalysis which was negative for infection, and chest x-ray on admission was negative. Patient had TSH done which came back within normal limits -The impression was that this likely d/t Deconditioning -PT OT was consulted and saw patient and recommended skilled nursing for rehabilitation -[facility], he was placed social work at a nursing facility in [place] but patient and his family have declined her. His daughter states her she has the resources to home O2 the patient and would like home health rehab services set up and this has been done. Active Problems:  Diastolic HF (heart failure)  -Pt was diuresed with Lasix this hospital stay and is to continue his oral Lasix upon discharge. Chronic respiratory failure/s/p Tracheostomy - nocturnal vent dependence  -CCM assisted with vent settings in the hospital, and he was maintained on the vent at these settings each bedtime  -Pt's Respiratory status has remained stable and he is off the vent during the days, this hospital stay.  -He is to continue with the vent each bedtime upon discharge and Follow up with his outpatient MDs. Type I (juvenile type) diabetes mellitus with renal manifestations, not stated as uncontrolled(250.41)  -Patient was maintained on his Lantus during this hospital stay with sliding scale coverage OSA (obstructive sleep apnea)    Procedures:  none  Consultations:  none  Discharge Exam: Filed Vitals:  11/28/12 1224  BP:   Pulse: 71  Temp:   Resp: 16    Exam:  General: alert & oriented x 3 In NAD, sitting up on side of bed with trach in place(off vent this a.m.)  Cardiovascular: RRR, nl S1 s2  Respiratory: Decreased  breath sounds at the bases, no crackles or wheezes  Abdomen: soft +BS NT/ND, no masses palpable  Extremities: No cyanosis, chronic skin changes/hyperkeratotic   Discharge Instructions  Discharge Orders   Future Appointments Provider Department Dept Phone   02/07/2013 3:00 PM Romero Belling, MD [facility] [phone]   Future Orders Complete By Expires   Diet Carb Modified  As directed    Increase activity slowly  As directed        Medication List    STOP taking these medications       ALPRAZolam 3 MG 24 hr tablet  Commonly known as:  XANAX XR  Replaced by:  alprazolam 2 MG tablet      TAKE these medications       alprazolam 2 MG tablet  Commonly known as:  XANAX  Take 1 tablet (2 mg total) by mouth at bedtime as needed for anxiety.     amLODipine 5 MG tablet  Commonly known as:  NORVASC  Take 1 tablet (5 mg total) by mouth daily.     carvedilol 6.25 MG tablet  Commonly known as:  COREG  Take 1 tablet (6.25 mg total) by mouth 2 (two) times daily with a meal.     citalopram 40 MG tablet  Commonly known as:  CELEXA  Take 1 tablet (40 mg total) by mouth daily.     cloNIDine 0.1 MG tablet  Commonly known as:  CATAPRES  Take 0.1 mg by mouth 2 (two) times daily.     furosemide 40 MG tablet  Commonly known as:  LASIX  Take 40 mg by mouth 2 (two) times daily.     insulin glargine 100 UNIT/ML injection  Commonly known as:  LANTUS  Inject 25 Units into the skin every morning.     lactulose 10 GM/15ML solution  Commonly known as:  CHRONULAC  Take 30 mLs (20 g total) by mouth 2 (two) times daily.     metoCLOPramide 5 MG tablet  Commonly known as:  REGLAN  Take 5 mg by mouth 3 (three) times daily.     multivitamin tablet  Take 1 tablet by mouth daily.     olmesartan 40 MG tablet  Commonly known as:  BENICAR  Take 40 mg by mouth daily.     oxyCODONE-acetaminophen 5-325 MG per tablet  Commonly known as:  PERCOCET/ROXICET  Take 1 tablet by  mouth every 4 (four) hours as needed.     potassium chloride SA 20 MEQ tablet  Commonly known as:  K-DUR,KLOR-CON  Take 1 tablet (20 mEq total) by mouth 2 (two) times daily.     simvastatin 20 MG tablet  Commonly known as:  ZOCOR  Take 20 mg by mouth daily.     warfarin 10 MG tablet  Commonly known as:  COUMADIN  Take 5-10 mg by mouth See admin instructions. Take 10 mg on Monday, Wed, Friday and 5 mg on other days.       No Known Allergies     Follow-up Information   Follow up with Romero Belling, MD. (in 1-2weeks, call for appt upon discharge)    Specialty:  Endocrinology   Contact information:   [phone]  [address] [place] [address] [phone]        The results of significant diagnostics from this hospitalization (including imaging, microbiology, ancillary and laboratory) are listed below for reference.    Significant Diagnostic Studies: Dg Chest 2 View  11/24/2012   CLINICAL DATA:  Weakness, knee pain  EXAM: CHEST  2 VIEW  COMPARISON:  03/23/2012  FINDINGS: Significant cardiomegaly with progression from prior exam. Tracheostomy tube in place. No pulmonary edema. Mild left basilar atelectasis. Mild degenerative changes thoracic spine.  IMPRESSION: Significant cardiomegaly with progression from prior exam. No pulmonary edema. Mild left basilar atelectasis. Tracheostomy tube in place.   Electronically Signed   By: Natasha Mead M.D.   On: 11/24/2012 13:23   Dg Chest Port 1 View  11/26/2012   CLINICAL DATA:  Shortness of breath and cough.  EXAM: PORTABLE CHEST - 1 VIEW  COMPARISON:  Chest radiograph performed 11/24/2012  FINDINGS: The patient's tracheostomy tube is seen ending 6-7 cm above the carina.  The lungs are well-aerated. Vascular congestion is noted, with mildly increased interstitial markings, raising concern for mild interstitial edema. There is no evidence of pleural effusion or pneumothorax.  The cardiomediastinal silhouette is enlarged. No acute osseous  abnormalities are seen.  IMPRESSION: Vascular congestion and cardiomegaly, with mildly increased interstitial markings, raising concern for mild interstitial edema.   Electronically Signed   By: Roanna Raider M.D.   On: 11/26/2012 05:47   Dg Knee Complete 4 Views Left  11/24/2012   CLINICAL DATA:  Left knee pain.  EXAM: LEFT KNEE - COMPLETE 4+ VIEW  COMPARISON:  None.  FINDINGS: There are severe tricompartmental degenerative changes with joint space narrowing, osteophytic spurring and bony eburnation. 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 No acute fracture or obvious joint effusion.  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  No acute bony findings.   Electronically Signed   By: Loralie Champagne M.D.   On: 11/24/2012 18:46   Dg Knee Complete 4 Views Right  11/24/2012   CLINICAL DATA:  Knee pain for 1 week, no injury  EXAM: RIGHT KNEE - COMPLETE 4+ VIEW  COMPARISON:  None.  FINDINGS: No fracture or dislocation. Severe narrowing and osteophyte formation of the medial compartment. Moderate narrowing and osteophyte formation of the lateral compartment. Moderate narrowing and osteophyte formation of the patellofemoral compartment. In the suprapatellar recess there is extensive soft tissue calcifications. This may represent a combination of loose bodies or calcified center of itis.  IMPRESSION: Significant degenerative change. No acute findings.   Electronically Signed   By: Esperanza Heir M.D.   On: 11/24/2012 18:45    Microbiology: Recent Results (from the past 240 hour(s))  MRSA PCR SCREENING     Status: None   Collection Time    11/26/12  3:41 AM      Result Value Range Status   MRSA by PCR NEGATIVE  NEGATIVE Final   Comment:            The GeneXpert MRSA Assay (FDA     approved for NASAL specimens      only), is one component of a     comprehensive MRSA colonization     surveillance program. It is not     intended to diagnose MRSA     infection nor to guide or  monitor treatment for     MRSA infections.     Labs: Basic Metabolic Panel:  Recent Labs Lab 11/24/12 1250 11/25/12 2302 11/26/12 0600 11/27/12 0505 11/28/12 0351  NA 141 139 139 138 136  K 4.2 3.9 3.7 3.7 4.1  CL 101 99 100 99 98  CO2 31 31 29 28 29   GLUCOSE 137* 136* 185* 153* 177*  BUN 19 19 19 17 17   CREATININE 1.51* 1.48* 1.36* 1.45* 1.42*  CALCIUM 9.3 9.5 8.8 8.9 8.9   Liver Function Tests:  Recent Labs Lab 11/26/12 0600  AST 20  ALT 11  ALKPHOS 69  BILITOT 0.3  PROT 6.8  ALBUMIN 3.1*   No results found for this basename: LIPASE, AMYLASE,  in the last 168 hours No results found for this basename: AMMONIA,  in the last 168 hours CBC:  Recent Labs Lab 11/24/12 1250 11/25/12 2302 11/26/12 0600 11/27/12 0505  WBC 5.6 6.2 5.5 5.9  NEUTROABS 4.0 4.5 3.5  --   HGB 12.5* 12.7* 11.6* 11.5*  HCT 38.1* 38.3* 35.1* 34.5*  MCV 90.3 89.3 88.6 88.0  PLT 152 154 148* 158   Cardiac Enzymes:  Recent Labs Lab 11/24/12 1250 11/25/12 2302  CKTOTAL  --  247*  TROPONINI <0.30  --    BNP: BNP (last 3 results)  Recent Labs  11/06/12 1640 11/24/12 1250 11/26/12 0600  PROBNP 58.0 264.4* 596.6*   CBG:  Recent Labs Lab 11/27/12 1726 11/27/12 2024 11/27/12 2233 11/28/12 0807 11/28/12 1151  GLUCAP 179* 196* 147* 137* 207*       Signed:  Aarav Burgett C  [facility] 11/28/2012, 2:08 PM

## 2012-11-28 NOTE — Clinical Social Work Note (Signed)
CSW called [facility], who says they don't have any male beds. CSW asked if pt's clinicals are on a waiting list/if CSW will be contact as soon as a bed opens, and Cassandra with admissions says they have pt's clinicals and they will call CSW when a bed opens because they do want pt. CSW continues to follow case and will follow up with [facility] again when discharge occurs to see if pt has this as an option. Otherwise, plan is for pt to go home as no other facility can accommodate pt. Pt is not interested in [facility] in [place] so options are limited.   Maryclare Labrador, MSW, [facility] Clinical Social Worker [phone]

## 2012-12-04 ENCOUNTER — Telehealth: Payer: Self-pay | Admitting: *Deleted

## 2012-12-04 NOTE — Telephone Encounter (Signed)
Unless he comes for ov, i cannot certify need for [facility] care

## 2012-12-04 NOTE — Telephone Encounter (Signed)
Consuella Lose from [facility] called, she wants more information on this patient has he is getting home nursing care.  You said you wanted to see him 2 weeks after he was discharged from the hospital, but the patient states he will not come because it's just another copay, CB# for Nurse is [phone]

## 2012-12-05 NOTE — Telephone Encounter (Signed)
Travis Chapman, home health nurse with [facility] at [phone] and lvm concerning pt. Advised her that Dr Everardo All said unless pt comes in for an OV, he cannot certify need for [facility] care. Be advised.

## 2012-12-09 ENCOUNTER — Other Ambulatory Visit: Payer: Self-pay | Admitting: Internal Medicine

## 2012-12-09 ENCOUNTER — Other Ambulatory Visit: Payer: Self-pay | Admitting: Endocrinology

## 2012-12-10 ENCOUNTER — Telehealth: Payer: Self-pay | Admitting: Endocrinology

## 2012-12-10 ENCOUNTER — Other Ambulatory Visit: Payer: Self-pay | Admitting: *Deleted

## 2012-12-10 ENCOUNTER — Telehealth: Payer: Self-pay | Admitting: Internal Medicine

## 2012-12-10 MED ORDER — METOCLOPRAMIDE HCL 5 MG PO TABS: 5.0000 mg | ORAL_TABLET | Freq: Three times a day (TID) | ORAL | Status: DC

## 2012-12-10 NOTE — Telephone Encounter (Signed)
I spoke with Melissa and she states she needs clinical documents supporting the pt need for a vent so she can continue to rent one. We do not have any clinical documents because the pt has never been seen here in the office. Melissa states that she will look into this and let us know. Carron Curie, CMA

## 2012-12-10 NOTE — Telephone Encounter (Signed)
Please call [facility].  As pt refuses appt, I can't certify HH.

## 2012-12-11 NOTE — Telephone Encounter (Signed)
Roosevelt Locks with [facility] at # [phone] and advised her that Dr Everardo All said, as the pt refuses appt, I can't certify [facility]. She stated she would tell him.

## 2012-12-16 ENCOUNTER — Other Ambulatory Visit: Payer: Self-pay | Admitting: Internal Medicine

## 2012-12-16 ENCOUNTER — Other Ambulatory Visit: Payer: Self-pay | Admitting: Endocrinology

## 2012-12-17 ENCOUNTER — Other Ambulatory Visit: Payer: Self-pay | Admitting: *Deleted

## 2012-12-17 ENCOUNTER — Other Ambulatory Visit: Payer: Self-pay | Admitting: Endocrinology

## 2012-12-17 ENCOUNTER — Other Ambulatory Visit: Payer: Self-pay | Admitting: Internal Medicine

## 2012-12-17 MED ORDER — CLONIDINE HCL 0.1 MG PO TABS: 0.1000 mg | ORAL_TABLET | Freq: Two times a day (BID) | ORAL | Status: DC

## 2012-12-17 NOTE — Telephone Encounter (Signed)
Please refill x 1 Ov is due  

## 2012-12-17 NOTE — Telephone Encounter (Signed)
Travis Chapman pt 

## 2012-12-18 ENCOUNTER — Other Ambulatory Visit: Payer: Self-pay | Admitting: *Deleted

## 2012-12-18 MED ORDER — AMLODIPINE BESYLATE 5 MG PO TABS: 5.0000 mg | ORAL_TABLET | Freq: Every day | ORAL | Status: DC

## 2012-12-23 ENCOUNTER — Ambulatory Visit (INDEPENDENT_AMBULATORY_CARE_PROVIDER_SITE_OTHER): Payer: Medicare Other | Admitting: Endocrinology

## 2012-12-23 ENCOUNTER — Encounter: Payer: Self-pay | Admitting: Endocrinology

## 2012-12-23 ENCOUNTER — Other Ambulatory Visit: Payer: Self-pay | Admitting: *Deleted

## 2012-12-23 VITALS — BP 126/80 | HR 68 | Temp 99.3°F | Ht 74.0 in

## 2012-12-23 DIAGNOSIS — I10 Essential (primary) hypertension: Secondary | ICD-10-CM

## 2012-12-23 MED ORDER — OXYCODONE-ACETAMINOPHEN 5-325 MG PO TABS: 1.0000 | ORAL_TABLET | ORAL | Status: DC | PRN

## 2012-12-23 MED ORDER — SIMVASTATIN 20 MG PO TABS: 20.0000 mg | ORAL_TABLET | Freq: Every day | ORAL | Status: DC

## 2012-12-23 MED ORDER — AMLODIPINE BESYLATE 2.5 MG PO TABS: 2.5000 mg | ORAL_TABLET | Freq: Every day | ORAL | Status: DC

## 2012-12-23 NOTE — Patient Instructions (Addendum)
Please reduce the amlodipine to 2.5 mg daily.  i have sent a prescription to your pharmacy. You should have a vaccine against shingles (a painful rash which results from the  chickenpox infection which most people had many years ago).  This vaccine reduces, but does not totally eliminate the risk of shingles.  Because this is a medicare part d benefit, you should get it at a pharmacy.   We'll call home-health, to ask them to resume services, now that you have been here today. i'll see you in late January.

## 2012-12-23 NOTE — Progress Notes (Signed)
Subjective:    Patient ID: Travis Chapman, male    DOB: 12/03/1945, 67 y.o.   MRN: [id number]  HPI The state of at least three ongoing medical problems is addressed today, with interval history of each noted here: HTN: pt says edema is less now.  CHF: he denies sob. Insomnia: increased xanax works well.   OA: Pt says he walks with a walker, but still has bilat knee pain.  Past Medical History  Diagnosis Date  . ANXIETY 07/23/2006  . ASTHMA 07/23/2006  . DM 05/23/2007  . HYPERTENSION 07/23/2006  . OSTEOARTHRITIS 07/23/2006  . HYPERCHOLESTEROLEMIA 05/23/2007  . ANEMIA 08/31/2008  . Palpitations 02/10/2008  . ALCOHOL ABUSE, IN REMISSION, HX OF 08/26/2008  . TOBACCO ABUSE, HX OF 08/26/2008  . Pulmonary embolism 03/16/2010  . ED (erectile dysfunction)   . Morbid obesity   . DM retinopathy   . 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 DM neuropathy with neurologic complication   . CHF (congestive heart failure)     With a preserved EF  . 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 Sleep apnea   . Shortness of breath   . CARCINOMA, PROSTATE 09/29/2009    Past Surgical History  Procedure Laterality Date  . Back surgery    . Tracheostomy      History   Social History  . Marital Status: Divorced    Spouse Name: N/A    Number of Children: N/A  . Years of Education: N/A   Occupational History  . Disabled    Social History Main Topics  . Smoking status: Former Smoker    Quit date: 01/16/2005  . Smokeless tobacco: Not on file  . Alcohol Use: No  . Drug Use: Not on file  . Sexual Activity: Not on file   Other Topics Concern  . Not on file   Social History Narrative   Single   Lives with his elderly mother          Current Outpatient Prescriptions on File Prior to Visit  Medication Sig Dispense Refill  . Alcohol Swabs (ALCOHOL PREP) 70 % PADS       . ALPRAZolam (XANAX) 2 MG tablet Take 1 tablet (2 mg  total) by mouth at bedtime as needed for anxiety.    0  . carvedilol (COREG) 6.25 MG tablet Take 1 tablet (6.25 mg total) by mouth 2 (two) times daily with a meal.  60 tablet  4  . citalopram (CELEXA) 40 MG tablet Take 1 tablet (40 mg total) by mouth daily.  30 tablet  4  . cloNIDine (CATAPRES) 0.1 MG tablet Take 1 tablet (0.1 mg total) by mouth 2 (two) times daily.  60 tablet  1  . EASY COMFORT INSULIN SYRINGE 30G X 1/2" 1 ML MISC       . furosemide (LASIX) 40 MG tablet Take 40 mg by mouth 2 (two) times daily.      . insulin glargine (LANTUS) 100 UNIT/ML injection Inject 25 Units into the skin every morning.  10 mL  3  . lactulose (CHRONULAC) 10 GM/15ML solution Take 30 mLs (20 g total) by mouth 2 (two) times daily.  240 mL  11  . metoCLOPramide (REGLAN) 5 MG tablet Take 1 tablet (5 mg total) by mouth 3 (three) times daily.  90 tablet  2  . Multiple Vitamin (MULTIVITAMIN) tablet Take  1 tablet by mouth daily.       Marland Kitchen olmesartan (BENICAR) 40 MG tablet Take 40 mg by mouth daily.      . potassium chloride SA (K-DUR,KLOR-CON) 20 MEQ tablet Take 1 tablet (20 mEq total) by mouth 2 (two) times daily.  60 tablet  2  . warfarin (COUMADIN) 10 MG tablet Take 5-10 mg by mouth See admin instructions. Take 10 mg on Monday, Wed, Friday and 5 mg on other days.       No current facility-administered medications on file prior to visit.   No Known Allergies  Family History  Problem Relation Age of Onset  . Cancer Neg Hx     BP 126/80  Pulse 68  Temp(Src) 99.3 F (37.4 C) (Oral)  Ht 6\' 2"  (1.88 m)  SpO2 93%  Review of Systems Denies chest pain and weight change.      Objective:   Physical Exam VITAL SIGNS:  See vs page GENERAL: no distress.  Morbid obesity.  In wheelchair.   Ext: 1+ bilat leg edema     Assessment & Plan:  Edema, prob exac by norvasc, improved but not resolved HTN: well-controlled CHF: well-controlled OA: this severely limits his ability to ambulate.

## 2012-12-29 ENCOUNTER — Other Ambulatory Visit: Payer: Self-pay | Admitting: Endocrinology

## 2013-01-13 ENCOUNTER — Other Ambulatory Visit: Payer: Self-pay

## 2013-01-13 MED ORDER — INSULIN GLARGINE 100 UNIT/ML ~~LOC~~ SOLN: 25.0000 [IU] | Freq: Every morning | SUBCUTANEOUS | Status: DC

## 2013-01-22 ENCOUNTER — Ambulatory Visit (INDEPENDENT_AMBULATORY_CARE_PROVIDER_SITE_OTHER): Payer: Medicare Other | Admitting: *Deleted

## 2013-01-22 DIAGNOSIS — I2699 Other pulmonary embolism without acute cor pulmonale: Secondary | ICD-10-CM

## 2013-01-22 LAB — POCT INR: INR: 1.2

## 2013-01-22 MED ORDER — WARFARIN SODIUM 10 MG PO TABS: 10.0000 mg | ORAL_TABLET | ORAL | Status: DC

## 2013-01-23 ENCOUNTER — Other Ambulatory Visit: Payer: Self-pay | Admitting: Endocrinology

## 2013-01-28 ENCOUNTER — Telehealth: Payer: Self-pay | Admitting: Emergency Medicine

## 2013-01-28 ENCOUNTER — Telehealth: Payer: Self-pay

## 2013-01-28 DIAGNOSIS — Z9911 Dependence on respirator [ventilator] status: Secondary | ICD-10-CM

## 2013-01-28 NOTE — Telephone Encounter (Signed)
Spoke with [employer]. She is needing orders signed on pt for her vent (plan of treatment form). When she called back in November she was reports she was told this was RB pt. I do not see where pt has ever seen RB in the office ONLY [facility] back in 2010. RB saw pt in the hospital back in 2013. Pt has not bene seen in the office since 2010. Dr. Melvyn Novas please advise if we need to see pt or if this needs to go through another one of pt's physicians? thanks

## 2013-01-28 NOTE — Telephone Encounter (Signed)
Not able to do sign any orders as have not seen pt in over 4 years - will need primary doctor to do these or refer her back

## 2013-01-28 NOTE — Telephone Encounter (Signed)
I called and made Melissa aware of MW recs. Nothing further needed

## 2013-01-28 NOTE — Telephone Encounter (Signed)
Melissa a nurse with [facility] called asking if it would be ok with for Ventilator plan of care paper work to be faxed over. Pt's Pulmonary Dr has already been contacted and declined due to the fact that he has not seen the pt with in a year. Pt states that Dr. Loanne Drilling is PCP. Please advise, Thanks!

## 2013-01-28 NOTE — Telephone Encounter (Signed)
This is outside of the scope of my practice.  i have ref pt back to pulm.

## 2013-01-29 ENCOUNTER — Telehealth: Payer: Self-pay | Admitting: Internal Medicine

## 2013-01-29 NOTE — Telephone Encounter (Signed)
Informed Melissa with [facility].

## 2013-01-29 NOTE — Telephone Encounter (Signed)
Spoke with Melissa. She reports she spoke with Dr. Loanne Drilling. He is referring her back to [facility]. Dr. Loanne Drilling did place referral in EPIC. Melissa will drop paperwork off on pt for when he schedules an appt.   I called pt and wife answered. Pt was sleeping but will advised pt to call our office back.

## 2013-01-30 NOTE — Telephone Encounter (Signed)
ATC no answer. No voicemail. WCB. [place] Bing, CMA

## 2013-01-31 ENCOUNTER — Ambulatory Visit (INDEPENDENT_AMBULATORY_CARE_PROVIDER_SITE_OTHER): Payer: Medicare Other | Admitting: General Practice

## 2013-01-31 DIAGNOSIS — I2699 Other pulmonary embolism without acute cor pulmonale: Secondary | ICD-10-CM

## 2013-01-31 LAB — POCT INR: INR: 1.9

## 2013-01-31 NOTE — Telephone Encounter (Signed)
Appt is set with RB for 02-10-13. [place] Bing, CMA

## 2013-01-31 NOTE — Progress Notes (Signed)
Pre-visit discussion using our clinic review tool. No additional management support is needed unless otherwise documented below in the visit note.  

## 2013-02-07 ENCOUNTER — Ambulatory Visit: Payer: Medicare Other | Admitting: Endocrinology

## 2013-02-10 ENCOUNTER — Institutional Professional Consult (permissible substitution): Payer: Medicare Other | Admitting: Emergency Medicine

## 2013-02-11 ENCOUNTER — Other Ambulatory Visit: Payer: Self-pay | Admitting: Endocrinology

## 2013-02-12 ENCOUNTER — Other Ambulatory Visit: Payer: Self-pay | Admitting: *Deleted

## 2013-02-12 ENCOUNTER — Other Ambulatory Visit: Payer: Self-pay

## 2013-02-12 MED ORDER — FUROSEMIDE 40 MG PO TABS: 40.0000 mg | ORAL_TABLET | Freq: Two times a day (BID) | ORAL | Status: DC

## 2013-02-12 MED ORDER — CLONIDINE HCL 0.1 MG PO TABS: 0.1000 mg | ORAL_TABLET | Freq: Two times a day (BID) | ORAL | Status: DC

## 2013-02-19 ENCOUNTER — Encounter: Payer: Self-pay | Admitting: Emergency Medicine

## 2013-02-19 ENCOUNTER — Ambulatory Visit (INDEPENDENT_AMBULATORY_CARE_PROVIDER_SITE_OTHER): Payer: Medicare Other | Admitting: Emergency Medicine

## 2013-02-19 VITALS — BP 140/86 | HR 67 | Ht 74.0 in | Wt 370.0 lb

## 2013-02-19 DIAGNOSIS — Z9911 Dependence on respirator [ventilator] status: Secondary | ICD-10-CM

## 2013-02-19 NOTE — Progress Notes (Signed)
Subjective:    Patient ID: Travis Chapman, male    DOB: Jul 01, 1945, 68 y.o.   MRN: [id number]  HPI 68 yo man, former smoker, hx of chronic resp failure due to OSA/OHS, HTN with diastolic CHF, hx PE (treated). He has trach placed in 2006 and has been on TC during day, vent at night.  Followed by Dr Yevonne Aline in [place]. Gets his trach changed q2 months. Sleeps every night w vent thru [facility]. Suctions himself.    Review of Systems  Constitutional: Negative for fever and unexpected weight change.  HENT: Negative for congestion, dental problem, ear pain, nosebleeds, postnasal drip, rhinorrhea, sinus pressure, sneezing, sore throat and trouble swallowing.   Eyes: Negative for redness and itching.  Respiratory: Positive for shortness of breath. Negative for cough, chest tightness and wheezing.   Cardiovascular: Negative for palpitations and leg swelling.  Gastrointestinal: Negative for nausea and vomiting.  Genitourinary: Negative for dysuria.  Musculoskeletal: Positive for joint swelling.       Pain  Skin: Negative for rash.  Neurological: Negative for headaches.  Hematological: Does not bruise/bleed easily.  Psychiatric/Behavioral: Negative for dysphoric mood. The patient is not nervous/anxious.    Past Medical History  Diagnosis Date  . ANXIETY 07/23/2006  . ASTHMA 07/23/2006  . DM 05/23/2007  . HYPERTENSION 07/23/2006  . OSTEOARTHRITIS 07/23/2006  . HYPERCHOLESTEROLEMIA 05/23/2007  . ANEMIA 08/31/2008  . Palpitations 02/10/2008  . ALCOHOL ABUSE, IN REMISSION, HX OF 08/26/2008  . TOBACCO ABUSE, HX OF 08/26/2008  . Pulmonary embolism 03/16/2010  . ED (erectile dysfunction)   . Morbid obesity   . DM retinopathy   . 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 DM neuropathy with neurologic complication   . CHF (congestive heart failure)     With a preserved EF  . 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 Sleep apnea    . Shortness of breath   . CARCINOMA, PROSTATE 09/29/2009     Family History  Problem Relation Age of Onset  . Cancer Neg Hx      History   Social History  . Marital Status: Divorced    Spouse Name: N/A    Number of Children: N/A  . Years of Education: N/A   Occupational History  . Disabled    Social History Main Topics  . Smoking status: Former Smoker -- 0.50 packs/day for 15 years    Types: Cigarettes    Quit date: 01/16/2005  . Smokeless tobacco: Not on file  . Alcohol Use: No  . Drug Use: Not on file  . Sexual Activity: Not on file   Other Topics Concern  . Not on file   Social History Narrative   Single   Lives with his elderly mother           No Known Allergies   Outpatient Prescriptions Prior to Visit  Medication Sig Dispense Refill  . Alcohol Swabs (ALCOHOL PREP) 70 % PADS       . ALPRAZolam (XANAX) 2 MG tablet Take 1 tablet (2 mg total) by mouth at bedtime as needed for anxiety.    0  . amLODipine (NORVASC) 2.5 MG tablet Take 1 tablet (2.5 mg total) by mouth daily.  90 tablet  3  . carvedilol (COREG) 6.25 MG tablet Take 1 tablet (6.25 mg total) by mouth 2 (two) times daily with a meal.  60  tablet  4  . citalopram (CELEXA) 40 MG tablet Take 1 tablet (40 mg total) by mouth daily.  30 tablet  4  . cloNIDine (CATAPRES) 0.1 MG tablet Take 1 tablet (0.1 mg total) by mouth 2 (two) times daily.  60 tablet  1  . EASY COMFORT INSULIN SYRINGE 30G X 1/2" 1 ML MISC       . furosemide (LASIX) 40 MG tablet Take 1 tablet (40 mg total) by mouth 2 (two) times daily.  180 tablet  0  . HYDROcodone-acetaminophen (NORCO) 10-325 MG per tablet       . insulin glargine (LANTUS) 100 UNIT/ML injection Inject 0.25 mLs (25 Units total) into the skin every morning.  10 mL  3  . KLOR-CON M20 20 MEQ tablet TAKE TWO TABLETS BY MOUTH ONCE DAILY  60 tablet  0  . lactulose (CHRONULAC) 10 GM/15ML solution Take 30 mLs (20 g total) by mouth 2 (two) times daily.  240 mL  11  .  metoCLOPramide (REGLAN) 5 MG tablet Take 1 tablet (5 mg total) by mouth 3 (three) times daily.  90 tablet  2  . Multiple Vitamin (MULTIVITAMIN) tablet Take 1 tablet by mouth daily.       Marland Kitchen olmesartan (BENICAR) 40 MG tablet Take 40 mg by mouth daily.      Marland Kitchen oxyCODONE-acetaminophen (PERCOCET/ROXICET) 5-325 MG per tablet Take 1 tablet by mouth every 4 (four) hours as needed.  100 tablet  0  . simvastatin (ZOCOR) 20 MG tablet Take 1 tablet (20 mg total) by mouth daily.  90 tablet  0  . warfarin (COUMADIN) 10 MG tablet Take 1 tablet (10 mg total) by mouth as directed.  30 tablet  0   No facility-administered medications prior to visit.         Objective:   Physical Exam Filed Vitals:   02/19/13 1529  BP: 140/86  Pulse: 67  Height: 6\' 2"  (1.88 m)  Weight: 370 lb (167.831 kg)  SpO2: 97%   Gen: Pleasant, obese man, in no distress,  normal affect, in wheelchair  ENT: No lesions,  mouth clear,  oropharynx clear, no postnasal drip, speaks by occluding trach w finger.   Neck: No JVD, no TMG, no carotid bruits, trach site CDI  Lungs: No use of accessory muscles, no dullness to percussion, clear without rales or rhonchi  Cardiovascular: RRR, heart sounds normal, no murmur or gallops, chronic edema and venous stasis changes  Musculoskeletal: No deformities, no cyanosis or clubbing  Neuro: alert, non focal  Skin: Warm, no lesions or rashes     Assessment & Plan:  Ventilator dependence Chronic resp failure, vent dependent at night. Seems well compensated on current regimen. Will continue same. He gets his trach changed w ENT in [place].  - will [facility] - continue same vent settings for now.  - rov 6

## 2013-02-19 NOTE — Assessment & Plan Note (Signed)
Chronic resp failure, vent dependent at night. Seems well compensated on current regimen. Will continue same. He gets his trach changed w ENT in [place].  - will get his vent settings from [facility] - continue same vent settings for now.  - rov 6

## 2013-02-19 NOTE — Patient Instructions (Signed)
We will get your ventilator settings from [facility] and continue these settings for now Continue to get your scheduled trach changes in [place] Call our office if you have any problems with your ventilator or trach Follow with Dr Lamonte Sakai in 6 months or sooner if you have any problems

## 2013-02-25 ENCOUNTER — Other Ambulatory Visit: Payer: Self-pay | Admitting: Endocrinology

## 2013-02-26 ENCOUNTER — Ambulatory Visit (INDEPENDENT_AMBULATORY_CARE_PROVIDER_SITE_OTHER): Payer: Medicare Other | Admitting: General Practice

## 2013-02-26 ENCOUNTER — Telehealth: Payer: Self-pay

## 2013-02-26 DIAGNOSIS — Z5181 Encounter for therapeutic drug level monitoring: Secondary | ICD-10-CM | POA: Insufficient documentation

## 2013-02-26 DIAGNOSIS — I2699 Other pulmonary embolism without acute cor pulmonale: Secondary | ICD-10-CM

## 2013-02-26 LAB — POCT INR: INR: 2.1

## 2013-02-26 MED ORDER — OLMESARTAN MEDOXOMIL 40 MG PO TABS: 40.0000 mg | ORAL_TABLET | Freq: Every day | ORAL | Status: DC

## 2013-02-26 NOTE — Telephone Encounter (Signed)
Request from pharmacy for [place] came in. Pt was suppose to come back in January and did not.  Ok to fill? Thanks!

## 2013-02-26 NOTE — Progress Notes (Signed)
Pre-visit discussion using our clinic review tool. No additional management support is needed unless otherwise documented below in the visit note.  

## 2013-02-26 NOTE — Telephone Encounter (Signed)
Please refill x 1 Ov is due  

## 2013-02-26 NOTE — Telephone Encounter (Signed)
Done

## 2013-03-05 ENCOUNTER — Ambulatory Visit: Payer: Medicare Other | Admitting: Endocrinology

## 2013-03-05 ENCOUNTER — Telehealth: Payer: Self-pay | Admitting: *Deleted

## 2013-03-05 NOTE — Telephone Encounter (Signed)
[facility] Fax: [phone] From: [facility] Date/ Time: 03/04/2013 11:48 AM Taken By: Olena Heckle, CSR Caller: [place]: not collected Patient: Travis Chapman DOB: 1945/10/12 Phone: [phone] Reason for Call: See info below Regarding Appointment: Yes Appt Date: 03/05/2013 Appt Time: 3:45:00 AM Provider: Renato Shin (Adults only) Reason: Cancel Appointment Details: Pt calling to cancel due to weather. Outcome: Cancelled appointment in EPIC [facility])

## 2013-03-10 ENCOUNTER — Ambulatory Visit (INDEPENDENT_AMBULATORY_CARE_PROVIDER_SITE_OTHER): Payer: Medicare Other | Admitting: Endocrinology

## 2013-03-10 ENCOUNTER — Encounter: Payer: Self-pay | Admitting: Endocrinology

## 2013-03-10 VITALS — BP 132/98 | HR 70 | Temp 98.2°F

## 2013-03-10 DIAGNOSIS — R0600 Dyspnea, unspecified: Secondary | ICD-10-CM

## 2013-03-10 DIAGNOSIS — R071 Chest pain on breathing: Secondary | ICD-10-CM

## 2013-03-10 DIAGNOSIS — R0989 Other specified symptoms and signs involving the circulatory and respiratory systems: Secondary | ICD-10-CM

## 2013-03-10 DIAGNOSIS — R0609 Other forms of dyspnea: Secondary | ICD-10-CM

## 2013-03-10 DIAGNOSIS — E1029 Type 1 diabetes mellitus with other diabetic kidney complication: Secondary | ICD-10-CM

## 2013-03-10 DIAGNOSIS — R0789 Other chest pain: Secondary | ICD-10-CM

## 2013-03-10 MED ORDER — OXYCODONE-ACETAMINOPHEN 10-325 MG PO TABS: 1.0000 | ORAL_TABLET | ORAL | Status: DC | PRN

## 2013-03-10 MED ORDER — LOSARTAN POTASSIUM 100 MG PO TABS: 50.0000 mg | ORAL_TABLET | Freq: Every day | ORAL | Status: DC

## 2013-03-10 NOTE — Progress Notes (Signed)
Subjective:    Patient ID: Travis Chapman, male    DOB: 1945-03-03, 68 y.o.   MRN: [id number]  HPI Pt returns for f/u of insulin-requiring DM (dx'ed 6789; complicated by nephropathy; he needs a simple regimen, as he was unwilling to take multiple daily injections). no cbg record, but states cbg's are well-controlled. He ran out of benicar.   Past Medical History  Diagnosis Date  . ANXIETY 07/23/2006  . ASTHMA 07/23/2006  . DM 05/23/2007  . HYPERTENSION 07/23/2006  . OSTEOARTHRITIS 07/23/2006  . HYPERCHOLESTEROLEMIA 05/23/2007  . ANEMIA 08/31/2008  . Palpitations 02/10/2008  . ALCOHOL ABUSE, IN REMISSION, HX OF 08/26/2008  . TOBACCO ABUSE, HX OF 08/26/2008  . Pulmonary embolism 03/16/2010  . ED (erectile dysfunction)   . Morbid obesity   . DM retinopathy   . 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 DM neuropathy with neurologic complication   . CHF (congestive heart failure)     With a preserved EF  . 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 Sleep apnea   . Shortness of breath   . CARCINOMA, PROSTATE 09/29/2009    Past Surgical History  Procedure Laterality Date  . Back surgery    . Tracheostomy      History   Social History  . Marital Status: Divorced    Spouse Name: N/A    Number of Children: N/A  . Years of Education: N/A   Occupational History  . Disabled    Social History Main Topics  . Smoking status: Former Smoker -- 0.50 packs/day for 15 years    Types: Cigarettes    Quit date: 01/16/2005  . Smokeless tobacco: Not on file  . Alcohol Use: No  . Drug Use: Not on file  . Sexual Activity: Not on file   Other Topics Concern  . Not on file   Social History Narrative   Single   Lives with his elderly mother          Current Outpatient Prescriptions on File Prior to Visit  Medication Sig Dispense Refill  . Alcohol Swabs (ALCOHOL PREP) 70 % PADS       . ALPRAZolam (XANAX) 2 MG tablet  Take 1 tablet (2 mg total) by mouth at bedtime as needed for anxiety.    0  . amLODipine (NORVASC) 10 MG tablet TAKE ONE TABLET BY MOUTH ONCE DAILY  30 tablet  0  . amLODipine (NORVASC) 2.5 MG tablet Take 1 tablet (2.5 mg total) by mouth daily.  90 tablet  3  . carvedilol (COREG) 6.25 MG tablet Take 1 tablet (6.25 mg total) by mouth 2 (two) times daily with a meal.  60 tablet  4  . citalopram (CELEXA) 40 MG tablet Take 1 tablet (40 mg total) by mouth daily.  30 tablet  4  . cloNIDine (CATAPRES) 0.1 MG tablet Take 1 tablet (0.1 mg total) by mouth 2 (two) times daily.  60 tablet  1  . EASY COMFORT INSULIN SYRINGE 30G X 1/2" 1 ML MISC       . furosemide (LASIX) 40 MG tablet Take 1 tablet (40 mg total) by mouth 2 (two) times daily.  180 tablet  0  . insulin glargine (LANTUS) 100 UNIT/ML injection Inject 0.25 mLs (25 Units total) into the skin every morning.  10 mL  3  . KLOR-CON M20 20 MEQ tablet TAKE TWO  TABLETS BY MOUTH ONCE DAILY  60 tablet  0  . lactulose (CHRONULAC) 10 GM/15ML solution Take 30 mLs (20 g total) by mouth 2 (two) times daily.  240 mL  11  . metoCLOPramide (REGLAN) 5 MG tablet Take 1 tablet (5 mg total) by mouth 3 (three) times daily.  90 tablet  2  . Multiple Vitamin (MULTIVITAMIN) tablet Take 1 tablet by mouth daily.       . simvastatin (ZOCOR) 20 MG tablet Take 1 tablet (20 mg total) by mouth daily.  90 tablet  0  . warfarin (COUMADIN) 10 MG tablet TAKE AS DIRECTED  30 tablet  0   No current facility-administered medications on file prior to visit.    No Known Allergies  Family History  Problem Relation Age of Onset  . Cancer Neg Hx     BP 132/98  Pulse 70  Temp(Src) 98.2 F (36.8 C) (Oral)  SpO2 93%  Review of Systems vicodin does not control knee pain.  He has left-sided chest pain, with any cough or twisting of the torso.  denies hypoglycemia.      Objective:   Physical Exam VITAL SIGNS:  See vs page GENERAL: no distress.  Obese.  In wheelchair Chest wall:  nontender. LUNGS:  Clear to auscultation  Lab Results  Component Value Date   HGBA1C 7.3* 03/10/2013      Assessment & Plan:  DM: this is the best control this pt should aim for, given this regimen, which does match insulin to her changing needs throughout the day HTN: therapy limited by noncompliance.  i'll do the best i can. Chronic pain syndrome: he needs increased rx.  Narcotics can interact with his sleep med.  i'll balance symptom control and safety as best i can. Chest wall pain: usually due to muscle sprain.

## 2013-03-10 NOTE — Patient Instructions (Addendum)
blood tests are being requested for you today.  We'll contact you with results. check your blood sugar twice a day.  vary the time of day when you check, between before the 3 meals, and at bedtime.  also check if you have symptoms of your blood sugar being too high or too low.  please keep a record of the readings and bring it to your next appointment here.  You can write it on any piece of paper.  please call us sooner if your blood sugar goes below 70, or if you have a lot of readings over 200. Please come back for a follow-up appointment in 1 month.   i have sent a prescription to your pharmacy, for a cheaper alternative to the benicar.   Here is a prescription for a stronger pain pill.

## 2013-03-11 ENCOUNTER — Other Ambulatory Visit: Payer: Self-pay | Admitting: Endocrinology

## 2013-03-11 LAB — BRAIN NATRIURETIC PEPTIDE: PRO B NATRI PEPTIDE: 58 pg/mL (ref 0.0–100.0)

## 2013-03-11 LAB — D-DIMER, QUANTITATIVE: D-Dimer, Quant: 0.33 ug/mL-FEU (ref 0.00–0.48)

## 2013-03-11 LAB — HEMOGLOBIN A1C: Hgb A1c MFr Bld: 7.3 % — ABNORMAL HIGH (ref 4.6–6.5)

## 2013-03-12 ENCOUNTER — Other Ambulatory Visit: Payer: Self-pay

## 2013-03-12 DIAGNOSIS — I509 Heart failure, unspecified: Secondary | ICD-10-CM

## 2013-03-12 DIAGNOSIS — J45909 Unspecified asthma, uncomplicated: Secondary | ICD-10-CM

## 2013-03-12 DIAGNOSIS — E1049 Type 1 diabetes mellitus with other diabetic neurological complication: Secondary | ICD-10-CM

## 2013-03-12 DIAGNOSIS — I5032 Chronic diastolic (congestive) heart failure: Secondary | ICD-10-CM

## 2013-03-24 ENCOUNTER — Encounter (HOSPITAL_COMMUNITY): Payer: Self-pay | Admitting: Emergency Medicine

## 2013-03-24 ENCOUNTER — Observation Stay (HOSPITAL_COMMUNITY): Payer: Medicare Other

## 2013-03-24 ENCOUNTER — Inpatient Hospital Stay (HOSPITAL_COMMUNITY)
Admission: EM | Admit: 2013-03-24 | Discharge: 2013-03-29 | DRG: 313 | Disposition: A | Discharge status: Home Health | Payer: Medicare Other | Attending: Internal Medicine | Admitting: Internal Medicine

## 2013-03-24 DIAGNOSIS — R0902 Hypoxemia: Secondary | ICD-10-CM

## 2013-03-24 DIAGNOSIS — I5030 Unspecified diastolic (congestive) heart failure: Secondary | ICD-10-CM

## 2013-03-24 DIAGNOSIS — E1029 Type 1 diabetes mellitus with other diabetic kidney complication: Secondary | ICD-10-CM

## 2013-03-24 DIAGNOSIS — IMO0002 Reserved for concepts with insufficient information to code with codable children: Secondary | ICD-10-CM | POA: Diagnosis present

## 2013-03-24 DIAGNOSIS — I509 Heart failure, unspecified: Secondary | ICD-10-CM | POA: Diagnosis present

## 2013-03-24 DIAGNOSIS — Z9911 Dependence on respirator [ventilator] status: Secondary | ICD-10-CM

## 2013-03-24 DIAGNOSIS — Z79899 Other long term (current) drug therapy: Secondary | ICD-10-CM

## 2013-03-24 DIAGNOSIS — Z86711 Personal history of pulmonary embolism: Secondary | ICD-10-CM

## 2013-03-24 DIAGNOSIS — J9690 Respiratory failure, unspecified, unspecified whether with hypoxia or hypercapnia: Secondary | ICD-10-CM | POA: Diagnosis present

## 2013-03-24 DIAGNOSIS — R0989 Other specified symptoms and signs involving the circulatory and respiratory systems: Secondary | ICD-10-CM

## 2013-03-24 DIAGNOSIS — R531 Weakness: Secondary | ICD-10-CM

## 2013-03-24 DIAGNOSIS — E1142 Type 2 diabetes mellitus with diabetic polyneuropathy: Secondary | ICD-10-CM | POA: Diagnosis present

## 2013-03-24 DIAGNOSIS — R5383 Other fatigue: Secondary | ICD-10-CM

## 2013-03-24 DIAGNOSIS — N183 Chronic kidney disease, stage 3 unspecified: Secondary | ICD-10-CM | POA: Diagnosis present

## 2013-03-24 DIAGNOSIS — R0789 Other chest pain: Principal | ICD-10-CM

## 2013-03-24 DIAGNOSIS — E1139 Type 2 diabetes mellitus with other diabetic ophthalmic complication: Secondary | ICD-10-CM | POA: Diagnosis present

## 2013-03-24 DIAGNOSIS — R509 Fever, unspecified: Secondary | ICD-10-CM

## 2013-03-24 DIAGNOSIS — R262 Difficulty in walking, not elsewhere classified: Secondary | ICD-10-CM

## 2013-03-24 DIAGNOSIS — R5381 Other malaise: Secondary | ICD-10-CM

## 2013-03-24 DIAGNOSIS — I493 Ventricular premature depolarization: Secondary | ICD-10-CM

## 2013-03-24 DIAGNOSIS — L97909 Non-pressure chronic ulcer of unspecified part of unspecified lower leg with unspecified severity: Secondary | ICD-10-CM

## 2013-03-24 DIAGNOSIS — R079 Chest pain, unspecified: Secondary | ICD-10-CM

## 2013-03-24 DIAGNOSIS — M545 Low back pain, unspecified: Secondary | ICD-10-CM

## 2013-03-24 DIAGNOSIS — M25519 Pain in unspecified shoulder: Secondary | ICD-10-CM

## 2013-03-24 DIAGNOSIS — J9610 Chronic respiratory failure, unspecified whether with hypoxia or hypercapnia: Secondary | ICD-10-CM

## 2013-03-24 DIAGNOSIS — I10 Essential (primary) hypertension: Secondary | ICD-10-CM

## 2013-03-24 DIAGNOSIS — R635 Abnormal weight gain: Secondary | ICD-10-CM

## 2013-03-24 DIAGNOSIS — R9431 Abnormal electrocardiogram [ECG] [EKG]: Secondary | ICD-10-CM

## 2013-03-24 DIAGNOSIS — F411 Generalized anxiety disorder: Secondary | ICD-10-CM

## 2013-03-24 DIAGNOSIS — R0600 Dyspnea, unspecified: Secondary | ICD-10-CM

## 2013-03-24 DIAGNOSIS — E860 Dehydration: Secondary | ICD-10-CM

## 2013-03-24 DIAGNOSIS — M1990 Unspecified osteoarthritis, unspecified site: Secondary | ICD-10-CM

## 2013-03-24 DIAGNOSIS — I5032 Chronic diastolic (congestive) heart failure: Secondary | ICD-10-CM | POA: Diagnosis present

## 2013-03-24 DIAGNOSIS — G4733 Obstructive sleep apnea (adult) (pediatric): Secondary | ICD-10-CM

## 2013-03-24 DIAGNOSIS — R809 Proteinuria, unspecified: Secondary | ICD-10-CM

## 2013-03-24 DIAGNOSIS — E1149 Type 2 diabetes mellitus with other diabetic neurological complication: Secondary | ICD-10-CM | POA: Diagnosis present

## 2013-03-24 DIAGNOSIS — N289 Disorder of kidney and ureter, unspecified: Secondary | ICD-10-CM

## 2013-03-24 DIAGNOSIS — Z5181 Encounter for therapeutic drug level monitoring: Secondary | ICD-10-CM

## 2013-03-24 DIAGNOSIS — I428 Other cardiomyopathies: Secondary | ICD-10-CM

## 2013-03-24 DIAGNOSIS — C61 Malignant neoplasm of prostate: Secondary | ICD-10-CM

## 2013-03-24 DIAGNOSIS — Z87891 Personal history of nicotine dependence: Secondary | ICD-10-CM

## 2013-03-24 DIAGNOSIS — E7800 Pure hypercholesterolemia, unspecified: Secondary | ICD-10-CM

## 2013-03-24 DIAGNOSIS — F1021 Alcohol dependence, in remission: Secondary | ICD-10-CM

## 2013-03-24 DIAGNOSIS — Z7901 Long term (current) use of anticoagulants: Secondary | ICD-10-CM

## 2013-03-24 DIAGNOSIS — Z930 Tracheostomy status: Secondary | ICD-10-CM

## 2013-03-24 DIAGNOSIS — I2699 Other pulmonary embolism without acute cor pulmonale: Secondary | ICD-10-CM

## 2013-03-24 DIAGNOSIS — R0609 Other forms of dyspnea: Secondary | ICD-10-CM

## 2013-03-24 DIAGNOSIS — J45909 Unspecified asthma, uncomplicated: Secondary | ICD-10-CM

## 2013-03-24 DIAGNOSIS — I4729 Other ventricular tachycardia: Secondary | ICD-10-CM

## 2013-03-24 DIAGNOSIS — Z6841 Body Mass Index (BMI) 40.0 and over, adult: Secondary | ICD-10-CM

## 2013-03-24 DIAGNOSIS — I472 Ventricular tachycardia: Secondary | ICD-10-CM

## 2013-03-24 DIAGNOSIS — I5033 Acute on chronic diastolic (congestive) heart failure: Secondary | ICD-10-CM

## 2013-03-24 DIAGNOSIS — Z794 Long term (current) use of insulin: Secondary | ICD-10-CM

## 2013-03-24 DIAGNOSIS — D649 Anemia, unspecified: Secondary | ICD-10-CM

## 2013-03-24 DIAGNOSIS — M25569 Pain in unspecified knee: Secondary | ICD-10-CM

## 2013-03-24 DIAGNOSIS — M1710 Unilateral primary osteoarthritis, unspecified knee: Secondary | ICD-10-CM | POA: Diagnosis present

## 2013-03-24 DIAGNOSIS — R002 Palpitations: Secondary | ICD-10-CM

## 2013-03-24 DIAGNOSIS — E11319 Type 2 diabetes mellitus with unspecified diabetic retinopathy without macular edema: Secondary | ICD-10-CM | POA: Diagnosis present

## 2013-03-24 DIAGNOSIS — I129 Hypertensive chronic kidney disease with stage 1 through stage 4 chronic kidney disease, or unspecified chronic kidney disease: Secondary | ICD-10-CM | POA: Diagnosis present

## 2013-03-24 DIAGNOSIS — R29898 Other symptoms and signs involving the musculoskeletal system: Secondary | ICD-10-CM

## 2013-03-24 DIAGNOSIS — H5440 Blindness, one eye, unspecified eye: Secondary | ICD-10-CM | POA: Diagnosis present

## 2013-03-24 DIAGNOSIS — J9620 Acute and chronic respiratory failure, unspecified whether with hypoxia or hypercapnia: Secondary | ICD-10-CM

## 2013-03-24 DIAGNOSIS — E662 Morbid (severe) obesity with alveolar hypoventilation: Secondary | ICD-10-CM

## 2013-03-24 LAB — URINALYSIS, ROUTINE W REFLEX MICROSCOPIC
Glucose, UA: NEGATIVE mg/dL
HGB URINE DIPSTICK: NEGATIVE
KETONES UR: 15 mg/dL — AB
Leukocytes, UA: NEGATIVE
NITRITE: NEGATIVE
PH: 5.5 (ref 5.0–8.0)
Protein, ur: >300 mg/dL — AB
Specific Gravity, Urine: 1.038 — ABNORMAL HIGH (ref 1.005–1.030)
Urobilinogen, UA: 1 mg/dL (ref 0.0–1.0)

## 2013-03-24 LAB — COMPREHENSIVE METABOLIC PANEL
ALBUMIN: 3 g/dL — AB (ref 3.5–5.2)
ALK PHOS: 74 U/L (ref 39–117)
ALT: 12 U/L (ref 0–53)
AST: 16 U/L (ref 0–37)
BILIRUBIN TOTAL: 0.2 mg/dL — AB (ref 0.3–1.2)
BUN: 20 mg/dL (ref 6–23)
CHLORIDE: 103 meq/L (ref 96–112)
CO2: 29 mEq/L (ref 19–32)
Calcium: 8.7 mg/dL (ref 8.4–10.5)
Creatinine, Ser: 1.59 mg/dL — ABNORMAL HIGH (ref 0.50–1.35)
GFR calc Af Amer: 50 mL/min — ABNORMAL LOW (ref >90)
GFR calc non Af Amer: 43 mL/min — ABNORMAL LOW (ref >90)
Glucose, Bld: 110 mg/dL — ABNORMAL HIGH (ref 70–99)
Potassium: 4.4 mEq/L (ref 3.7–5.3)
Sodium: 142 mEq/L (ref 137–147)
Total Protein: 6.9 g/dL (ref 6.0–8.3)

## 2013-03-24 LAB — CBC
HCT: 36.2 % — ABNORMAL LOW (ref 39.0–52.0)
Hemoglobin: 11.6 g/dL — ABNORMAL LOW (ref 13.0–17.0)
MCH: 29 pg (ref 26.0–34.0)
MCHC: 32 g/dL (ref 30.0–36.0)
MCV: 90.5 fL (ref 78.0–100.0)
Platelets: 147 10*3/uL — ABNORMAL LOW (ref 150–400)
RBC: 4 MIL/uL — AB (ref 4.22–5.81)
RDW: 13.3 % (ref 11.5–15.5)
WBC: 5.6 10*3/uL (ref 4.0–10.5)

## 2013-03-24 LAB — PROTIME-INR
INR: 2.34 — ABNORMAL HIGH (ref 0.00–1.49)
Prothrombin Time: 24.9 seconds — ABNORMAL HIGH (ref 11.6–15.2)

## 2013-03-24 LAB — URINE MICROSCOPIC-ADD ON

## 2013-03-24 LAB — GLUCOSE, CAPILLARY: Glucose-Capillary: 150 mg/dL — ABNORMAL HIGH (ref 70–99)

## 2013-03-24 LAB — CBG MONITORING, ED
GLUCOSE-CAPILLARY: 103 mg/dL — AB (ref 70–99)
Glucose-Capillary: 90 mg/dL (ref 70–99)

## 2013-03-24 LAB — TROPONIN I
Troponin I: <0.3 ng/mL (ref <0.30)
Troponin I: <0.3 ng/mL (ref <0.30)
Troponin I: <0.3 ng/mL (ref <0.30)

## 2013-03-24 LAB — MRSA PCR SCREENING: MRSA by PCR: NEGATIVE

## 2013-03-24 MED ORDER — SODIUM CHLORIDE 0.9 % IJ SOLN: 3.0000 mL | INTRAMUSCULAR | Status: DC | PRN

## 2013-03-24 MED ORDER — OXYCODONE-ACETAMINOPHEN 5-325 MG PO TABS
2.0000 | ORAL_TABLET | Freq: Once | ORAL | Status: AC
  Administered 2013-03-24: 2 via ORAL
  Filled 2013-03-24: qty 2

## 2013-03-24 MED ORDER — ADULT MULTIVITAMIN W/MINERALS CH
1.0000 | ORAL_TABLET | Freq: Every day | ORAL | Status: DC
  Administered 2013-03-25 – 2013-03-29 (×5): 1 via ORAL
  Filled 2013-03-24 (×5): qty 1

## 2013-03-24 MED ORDER — OXYCODONE-ACETAMINOPHEN 5-325 MG PO TABS
1.0000 | ORAL_TABLET | ORAL | Status: DC | PRN
  Administered 2013-03-25 – 2013-03-29 (×7): 1 via ORAL
  Filled 2013-03-24 (×7): qty 1

## 2013-03-24 MED ORDER — CITALOPRAM HYDROBROMIDE 40 MG PO TABS
40.0000 mg | ORAL_TABLET | Freq: Every day | ORAL | Status: DC
  Administered 2013-03-24 – 2013-03-27 (×4): 40 mg via ORAL
  Filled 2013-03-24: qty 1
  Filled 2013-03-24: qty 4
  Filled 2013-03-24 (×2): qty 1

## 2013-03-24 MED ORDER — METOCLOPRAMIDE HCL 5 MG PO TABS
5.0000 mg | ORAL_TABLET | Freq: Three times a day (TID) | ORAL | Status: DC
Start: 2013-03-24 — End: 2013-03-29
  Administered 2013-03-24 – 2013-03-29 (×20): 5 mg via ORAL
  Filled 2013-03-24 (×24): qty 1

## 2013-03-24 MED ORDER — FUROSEMIDE 40 MG PO TABS
40.0000 mg | ORAL_TABLET | Freq: Two times a day (BID) | ORAL | Status: DC
  Administered 2013-03-25: 40 mg via ORAL
  Filled 2013-03-24 (×3): qty 1

## 2013-03-24 MED ORDER — SODIUM CHLORIDE 0.9 % IV SOLN: 250.0000 mL | INTRAVENOUS | Status: DC | PRN

## 2013-03-24 MED ORDER — WARFARIN SODIUM 10 MG PO TABS
10.0000 mg | ORAL_TABLET | ORAL | Status: DC
  Filled 2013-03-24: qty 1

## 2013-03-24 MED ORDER — NITROGLYCERIN 0.4 MG SL SUBL: 0.4000 mg | SUBLINGUAL_TABLET | SUBLINGUAL | Status: DC | PRN

## 2013-03-24 MED ORDER — LACTULOSE 10 GM/15ML PO SOLN
20.0000 g | Freq: Two times a day (BID) | ORAL | Status: DC
  Administered 2013-03-25 (×2): 20 g via ORAL
  Filled 2013-03-24 (×11): qty 30

## 2013-03-24 MED ORDER — OXYCODONE HCL 5 MG PO TABS
5.0000 mg | ORAL_TABLET | ORAL | Status: DC | PRN
Start: 2013-03-24 — End: 2013-03-29
  Administered 2013-03-24 – 2013-03-29 (×5): 5 mg via ORAL
  Filled 2013-03-24 (×5): qty 1

## 2013-03-24 MED ORDER — WARFARIN - PHARMACIST DOSING INPATIENT
Freq: Every day | Status: DC
  Administered 2013-03-25: 18:00:00

## 2013-03-24 MED ORDER — ALBUTEROL SULFATE (2.5 MG/3ML) 0.083% IN NEBU: 2.5000 mg | INHALATION_SOLUTION | RESPIRATORY_TRACT | Status: DC | PRN

## 2013-03-24 MED ORDER — ONDANSETRON HCL 4 MG/2ML IJ SOLN: 4.0000 mg | Freq: Four times a day (QID) | INTRAMUSCULAR | Status: DC | PRN

## 2013-03-24 MED ORDER — ONDANSETRON HCL 4 MG PO TABS
4.0000 mg | ORAL_TABLET | Freq: Four times a day (QID) | ORAL | Status: DC | PRN
  Administered 2013-03-24: 4 mg via ORAL
  Filled 2013-03-24: qty 1

## 2013-03-24 MED ORDER — INSULIN ASPART 100 UNIT/ML ~~LOC~~ SOLN
0.0000 [IU] | Freq: Three times a day (TID) | SUBCUTANEOUS | Status: DC
  Administered 2013-03-25 – 2013-03-27 (×5): 1 [IU] via SUBCUTANEOUS
  Administered 2013-03-27 – 2013-03-28 (×2): 2 [IU] via SUBCUTANEOUS
  Administered 2013-03-28: 1 [IU] via SUBCUTANEOUS
  Administered 2013-03-29: 2 [IU] via SUBCUTANEOUS

## 2013-03-24 MED ORDER — AMLODIPINE BESYLATE 10 MG PO TABS
10.0000 mg | ORAL_TABLET | Freq: Every day | ORAL | Status: DC
Start: 2013-03-24 — End: 2013-03-29
  Administered 2013-03-24 – 2013-03-29 (×6): 10 mg via ORAL
  Filled 2013-03-24 (×6): qty 1

## 2013-03-24 MED ORDER — SODIUM CHLORIDE 0.9 % IJ SOLN
3.0000 mL | Freq: Two times a day (BID) | INTRAMUSCULAR | Status: DC
  Administered 2013-03-24 – 2013-03-29 (×10): 3 mL via INTRAVENOUS

## 2013-03-24 MED ORDER — WARFARIN SODIUM 5 MG PO TABS
5.0000 mg | ORAL_TABLET | ORAL | Status: DC
  Filled 2013-03-24: qty 1

## 2013-03-24 MED ORDER — INSULIN GLARGINE 100 UNIT/ML ~~LOC~~ SOLN
25.0000 [IU] | Freq: Every morning | SUBCUTANEOUS | Status: DC
  Administered 2013-03-24 – 2013-03-29 (×6): 25 [IU] via SUBCUTANEOUS
  Filled 2013-03-24 (×6): qty 0.25

## 2013-03-24 MED ORDER — SODIUM CHLORIDE 0.9 % IV BOLUS (SEPSIS)
500.0000 mL | Freq: Once | INTRAVENOUS | Status: AC
  Administered 2013-03-24: 500 mL via INTRAVENOUS

## 2013-03-24 MED ORDER — ALPRAZOLAM 0.5 MG PO TABS
2.0000 mg | ORAL_TABLET | Freq: Every evening | ORAL | Status: DC | PRN
  Administered 2013-03-24 – 2013-03-29 (×5): 2 mg via ORAL
  Filled 2013-03-24 (×5): qty 4

## 2013-03-24 MED ORDER — SIMVASTATIN 20 MG PO TABS
20.0000 mg | ORAL_TABLET | Freq: Every day | ORAL | Status: DC
  Administered 2013-03-24 – 2013-03-29 (×6): 20 mg via ORAL
  Filled 2013-03-24 (×6): qty 1

## 2013-03-24 MED ORDER — GUAIFENESIN ER 600 MG PO TB12
600.0000 mg | ORAL_TABLET | Freq: Two times a day (BID) | ORAL | Status: DC
  Administered 2013-03-24 – 2013-03-29 (×11): 600 mg via ORAL
  Filled 2013-03-24 (×13): qty 1

## 2013-03-24 MED ORDER — ASPIRIN 81 MG PO CHEW
81.0000 mg | CHEWABLE_TABLET | Freq: Every day | ORAL | Status: DC
  Administered 2013-03-24 – 2013-03-29 (×6): 81 mg via ORAL
  Filled 2013-03-24 (×6): qty 1

## 2013-03-24 MED ORDER — CARVEDILOL 6.25 MG PO TABS
6.2500 mg | ORAL_TABLET | Freq: Two times a day (BID) | ORAL | Status: DC
  Administered 2013-03-24 – 2013-03-29 (×10): 6.25 mg via ORAL
  Filled 2013-03-24 (×12): qty 1

## 2013-03-24 MED ORDER — OXYCODONE-ACETAMINOPHEN 10-325 MG PO TABS: 1.0000 | ORAL_TABLET | ORAL | Status: DC | PRN

## 2013-03-24 MED ORDER — CLONIDINE HCL 0.1 MG PO TABS
0.1000 mg | ORAL_TABLET | Freq: Two times a day (BID) | ORAL | Status: DC
  Administered 2013-03-24 – 2013-03-29 (×11): 0.1 mg via ORAL
  Filled 2013-03-24 (×12): qty 1

## 2013-03-24 MED ORDER — SODIUM CHLORIDE 0.9 % IV BOLUS (SEPSIS)
1000.0000 mL | Freq: Once | INTRAVENOUS | Status: DC
Start: 2013-03-24 — End: 2013-03-24

## 2013-03-24 NOTE — ED Notes (Signed)
[facility] at bedside seeing patient

## 2013-03-24 NOTE — ED Notes (Signed)
Backup 7.0 [place] XLT delivered to room from [employer]-stated to RN that they don't have odd numbered inner cannulas just even ones, plan to call directly.

## 2013-03-24 NOTE — ED Notes (Signed)
Pt back from xray resp at bedside to do sputum and suction pt placed on 12 lead for ekg son at bedside

## 2013-03-24 NOTE — H&P (Addendum)
[facility] History and Physical  Travis Chapman [id number] DOB: August 13, 1945 DOA: 03/24/2013  Referring physician: Sabra Heck.  PCP: Renato Shin, MD   Chief Complaint: Lower extremities weakness, chest pain.   HPI: Travis Chapman is a 68 y.o. male hx of chronic resp failure due to OSA/OHS, HTN with diastolic CHF, hx PE on coumadin. He has trach placed in 2006 and has been on TC during day, vent at night who presents to ED complaining of LE weakness. He relates LE weakness for more than 10 years. The weakness is worse over last few days. He relates that his leg give out, he was not able to stand up or move. No focal weakness, no slurred speech. He also relates chronic bilateral knee pain, worse recently.   He also relates chest pain, for last 2 weeks. On and off, worse when he cough. At times chest pain is not related with cough. He denies fever, no worsening secretions.  He denies nausea, vomiting, abdominal pain.    Review of Systems:  Negative.   Past Medical History  Diagnosis Date  . ANXIETY 07/23/2006  . ASTHMA 07/23/2006  . DM 05/23/2007  . HYPERTENSION 07/23/2006  . OSTEOARTHRITIS 07/23/2006  . HYPERCHOLESTEROLEMIA 05/23/2007  . ANEMIA 08/31/2008  . Palpitations 02/10/2008  . ALCOHOL ABUSE, IN REMISSION, HX OF 08/26/2008  . TOBACCO ABUSE, HX OF 08/26/2008  . Pulmonary embolism 03/16/2010  . ED (erectile dysfunction)   . Morbid obesity   . DM retinopathy   . 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 DM neuropathy with neurologic complication   . CHF (congestive heart failure)     With a preserved EF  . 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 Sleep apnea   . Shortness of breath   . CARCINOMA, PROSTATE 09/29/2009   Past Surgical History  Procedure Laterality Date  . Back surgery    . Tracheostomy     Social History:  reports that he quit smoking about 8 years ago. His smoking use included Cigarettes. He  has a 7.5 pack-year smoking history. He does not have any smokeless tobacco history on file. He reports that he does not drink alcohol. His drug history is not on file.  No Known Allergies  Family History  Problem Relation Age of Onset  . Cancer Neg Hx      Prior to Admission medications   Medication Sig Start Date End Date Taking? Authorizing Provider  Alcohol Swabs (ALCOHOL PREP) 70 % PADS  11/25/12   Historical Provider, MD  ALPRAZolam Duanne Moron) 2 MG tablet Take 1 tablet (2 mg total) by mouth at bedtime as needed for anxiety. 11/28/12   Sheila Oats, MD  amLODipine (NORVASC) 10 MG tablet TAKE ONE TABLET BY MOUTH ONCE DAILY    Renato Shin, MD  amLODipine (NORVASC) 2.5 MG tablet Take 1 tablet (2.5 mg total) by mouth daily. 12/23/12   Renato Shin, MD  carvedilol (COREG) 6.25 MG tablet TAKE ONE TABLET BY MOUTH TWICE DAILY WITH  A  MEAL    Renato Shin, MD  citalopram (CELEXA) 40 MG tablet Take 1 tablet (40 mg total) by mouth daily. 11/18/12   Renato Shin, MD  cloNIDine (CATAPRES) 0.1 MG tablet Take 1 tablet (0.1 mg total) by mouth 2 (two) times daily. 02/12/13   Renato Shin, MD  EASY COMFORT INSULIN SYRINGE 30G X 1/2" 1 ML MISC  11/25/12  Historical Provider, MD  furosemide (LASIX) 40 MG tablet Take 1 tablet (40 mg total) by mouth 2 (two) times daily. 02/12/13   Renato Shin, MD  insulin glargine (LANTUS) 100 UNIT/ML injection Inject 0.25 mLs (25 Units total) into the skin every morning. 01/13/13   Renato Shin, MD  KLOR-CON M20 20 MEQ tablet TAKE TWO TABLETS BY MOUTH ONCE DAILY    Renato Shin, MD  lactulose (CHRONULAC) 10 GM/15ML solution Take 30 mLs (20 g total) by mouth 2 (two) times daily. 11/06/12   Renato Shin, MD  losartan (COZAAR) 100 MG tablet Take 0.5 tablets (50 mg total) by mouth daily. 03/10/13   Renato Shin, MD  metoCLOPramide (REGLAN) 5 MG tablet TAKE ONE TABLET BY MOUTH THREE TIMES DAILY    Renato Shin, MD  Multiple Vitamin (MULTIVITAMIN) tablet Take 1 tablet by mouth  daily.     Historical Provider, MD  oxyCODONE-acetaminophen (PERCOCET) 10-325 MG per tablet Take 1 tablet by mouth every 4 (four) hours as needed for pain. 03/10/13   Renato Shin, MD  simvastatin (ZOCOR) 20 MG tablet Take 1 tablet (20 mg total) by mouth daily. 12/23/12   Renato Shin, MD  warfarin (COUMADIN) 10 MG tablet TAKE AS DIRECTED    Renato Shin, MD   Physical Exam: Filed Vitals:   03/24/13 0759  BP: 142/80  Pulse: 76  Temp:   Resp: 20    BP 142/80  Pulse 76  Temp(Src) 99 F (37.2 C) (Oral)  Resp 20  Wt 183.707 kg (405 lb)  SpO2 100%  General:  Appears calm and comfortable, tach in place.  Eyes: PERRL, normal lids, irises & conjunctiva ENT: grossly normal hearing, lips & tongue Neck: no LAD, masses or thyromegaly, trach in place.  Cardiovascular: RRR, no m/r/g. Trace bilateral LE edema.  Respiratory:  Normal respiratory effort. Trach dependent, no wheezes, bilateral ronchus.  Abdomen: soft, ntnd, obese.  Skin: chronic venous stasis changes LE, hyperpigmentation, dry skin.  Musculoskeletal: grossly normal tone BUE/BLE Psychiatric: grossly normal mood and affect, speech fluent and appropriate Neurologic: grossly non-focal.           Labs on Admission:  Basic Metabolic Panel:  Recent Labs Lab 03/24/13 0339  NA 142  K 4.4  CL 103  CO2 29  GLUCOSE 110*  BUN 20  CREATININE 1.59*  CALCIUM 8.7   Liver Function Tests:  Recent Labs Lab 03/24/13 0339  AST 16  ALT 12  ALKPHOS 74  BILITOT 0.2*  PROT 6.9  ALBUMIN 3.0*   No results found for this basename: LIPASE, AMYLASE,  in the last 168 hours No results found for this basename: AMMONIA,  in the last 168 hours CBC:  Recent Labs Lab 03/24/13 0339  WBC 5.6  HGB 11.6*  HCT 36.2*  MCV 90.5  PLT 147*   Cardiac Enzymes: No results found for this basename: CKTOTAL, CKMB, CKMBINDEX, TROPONINI,  in the last 168 hours  BNP (last 3 results)  Recent Labs  11/24/12 1250 11/26/12 0600 03/10/13 1630    PROBNP 264.4* 596.6* 58.0   CBG: No results found for this basename: GLUCAP,  in the last 168 hours  Radiological Exams on Admission: No results found.  EKG: Independently reviewed. Ordered.   Assessment/Plan Active Problems:   CARDIOMYOPATHY   KNEE PAIN   Diastolic HF (heart failure)   Lower extremity weakness   Chronic respiratory failure   Ventilator dependence   Generalized weakness   Chest pain   1-Chest Pain; could be secondary to  bronchitis. Will check Chest X ray, EKG, Cycle cardiac enzymes. D dimer done in the ED negative. Nitroglycerin PRN for pain. Continue with Coreg, statins.  Start baby aspirin.   2-Chronic Respiratory Failure; TC dependent during day and Vent at night. Follow up by [facility]. I have consulted CCM/Pulmionary to help with management. Will check Chest x ray. Will send secretion for culture. Depending on Chest X ray results will consider antibiotics.   3-Bilateral Lower extremities weakness, bilateral knee pain; no focal motor strengthen on exam. Will order Knee X ray. PT consult.   4-Diastolic Heart Failure; compensated. BNP normal. I will continue with lasix.  5-Diabetes; Continue with lantus. SSI.  6-CKD stage 3; Last Cr per records at 1.4 (11-2012). Today Cr at 1.59. Will monitor on lasix. Hold cozaar.  7-Hypertension; continue with clonidine, coreg, Norvasc, lasix.  8-History of PE; coumadin per pharmacy.   Code Status: Full Code.  Family Communication: care discussed with son in law who was at bedside.  Disposition Plan: expect less than 2 nights.   Time spent: 75 minutes.   Kimber Relic [facility] Pager [phone]

## 2013-03-24 NOTE — Consult Note (Signed)
Name: Travis Chapman MRN: [id number] DOB: 30-Jul-1945    ADMISSION DATE:  03/24/2013 CONSULTATION DATE:  03/24/2013  REFERRING MD :  EDP PRIMARY SERVICE:  TRH  CHIEF COMPLAINT:  BLE weakness  BRIEF PATIENT DESCRIPTION: 68 year old male, trach 2006 requiring nocturnal vent. Presents 3/9 with BLE weakness and atypical chest pain x 2 weeks. Office pt of Dr. Lamonte Sakai.    SIGNIFICANT EVENTS / STUDIES:  3/9 - Admitted to [place] / TUBES: Trach 2006 >>> PIV  CULTURES: Sputum 3/9 >>>  ANTIBIOTICS: None  HISTORY OF PRESENT ILLNESS:  68 yo man, former smoker, hx of chronic resp failure due to OSA/OHS, HTN with diastolic CHF, hx PE (on coumadin). He has trach placed in 2006 and has been on TC during day, vent at night. Followed by Dr Yevonne Aline in [place]. Gets his trach changed q2 months. Sleeps every night w vent thru [facility]. Suctions himself.   He presented to ED 3/9 complaining of LE weakness. He relates LE weakness for more than 10 years. The weakness is worse over last few days. He relates that his leg give out, he was not able to stand up or move. No focal weakness, no slurred speech. He also relates chronic bilateral knee pain, worse recently. He also complains of chest pain, for last 2 weeks. On and off, worse when he cough. At times chest pain is not related with cough. He denies fever, no worsening secretions.     PAST MEDICAL HISTORY :  Past Medical History  Diagnosis Date  . ANXIETY 07/23/2006  . ASTHMA 07/23/2006  . DM 05/23/2007  . HYPERTENSION 07/23/2006  . OSTEOARTHRITIS 07/23/2006  . HYPERCHOLESTEROLEMIA 05/23/2007  . ANEMIA 08/31/2008  . Palpitations 02/10/2008  . ALCOHOL ABUSE, IN REMISSION, HX OF 08/26/2008  . TOBACCO ABUSE, HX OF 08/26/2008  . Pulmonary embolism 03/16/2010  . ED (erectile dysfunction)   . Morbid obesity   . DM retinopathy   . 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 DM neuropathy with neurologic complication   . CHF (congestive  heart failure)     With a preserved EF  . 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 Sleep apnea   . Shortness of breath   . CARCINOMA, PROSTATE 09/29/2009   Past Surgical History  Procedure Laterality Date  . Back surgery    . Tracheostomy     Prior to Admission medications   Medication Sig Start Date End Date Taking? Authorizing Provider  Alcohol Swabs (ALCOHOL PREP) 70 % PADS  11/25/12   Historical Provider, MD  ALPRAZolam Duanne Moron) 2 MG tablet Take 1 tablet (2 mg total) by mouth at bedtime as needed for anxiety. 11/28/12   Sheila Oats, MD  amLODipine (NORVASC) 10 MG tablet TAKE ONE TABLET BY MOUTH ONCE DAILY    Renato Shin, MD  amLODipine (NORVASC) 2.5 MG tablet Take 1 tablet (2.5 mg total) by mouth daily. 12/23/12   Renato Shin, MD  carvedilol (COREG) 6.25 MG tablet TAKE ONE TABLET BY MOUTH TWICE DAILY WITH  A  MEAL    Renato Shin, MD  citalopram (CELEXA) 40 MG tablet Take 1 tablet (40 mg total) by mouth daily. 11/18/12   Renato Shin, MD  cloNIDine (CATAPRES) 0.1 MG tablet Take 1 tablet (0.1 mg total) by mouth 2 (two) times daily. 02/12/13   Renato Shin, MD  EASY COMFORT INSULIN SYRINGE  30G X 1/2" 1 ML MISC  11/25/12   Historical Provider, MD  furosemide (LASIX) 40 MG tablet Take 1 tablet (40 mg total) by mouth 2 (two) times daily. 02/12/13   Renato Shin, MD  insulin glargine (LANTUS) 100 UNIT/ML injection Inject 0.25 mLs (25 Units total) into the skin every morning. 01/13/13   Renato Shin, MD  KLOR-CON M20 20 MEQ tablet TAKE TWO TABLETS BY MOUTH ONCE DAILY    Renato Shin, MD  lactulose (CHRONULAC) 10 GM/15ML solution Take 30 mLs (20 g total) by mouth 2 (two) times daily. 11/06/12   Renato Shin, MD  losartan (COZAAR) 100 MG tablet Take 0.5 tablets (50 mg total) by mouth daily. 03/10/13   Renato Shin, MD  metoCLOPramide (REGLAN) 5 MG tablet TAKE ONE TABLET BY MOUTH THREE TIMES DAILY    Renato Shin, MD  Multiple Vitamin  (MULTIVITAMIN) tablet Take 1 tablet by mouth daily.     Historical Provider, MD  oxyCODONE-acetaminophen (PERCOCET) 10-325 MG per tablet Take 1 tablet by mouth every 4 (four) hours as needed for pain. 03/10/13   Renato Shin, MD  simvastatin (ZOCOR) 20 MG tablet Take 1 tablet (20 mg total) by mouth daily. 12/23/12   Renato Shin, MD  warfarin (COUMADIN) 10 MG tablet TAKE AS DIRECTED    Renato Shin, MD   No Known Allergies  FAMILY HISTORY:  Family History  Problem Relation Age of Onset  . Cancer Neg Hx    SOCIAL HISTORY:  reports that he quit smoking about 8 years ago. His smoking use included Cigarettes. He has a 7.5 pack-year smoking history. He does not have any smokeless tobacco history on file. He reports that he does not drink alcohol. His drug history is not on file.  REVIEW OF SYSTEMS:   Bolds are positive  Constitutional: weight loss, gain, night sweats, Fevers, chills, fatigue .  HEENT: headaches, Sore throat, sneezing, nasal congestion, post nasal drip, Difficulty swallowing, Tooth/dental problems, visual complaints visual changes, ear ache CV:  chest pain, radiates: ,Orthopnea, PND, swelling in lower extremities, dizziness, palpitations, syncope.  GI  heartburn, indigestion, abdominal pain, nausea, vomiting, diarrhea, change in bowel habits, loss of appetite, bloody stools.  Resp: cough, productive: , hemoptysis, dyspnea, chest pain, pleuritic, worse with coughing and movement. Skin: rash or itching or icterus GU: dysuria, change in color of urine, urgency or frequency. flank pain, hematuria  MS: joint pain (BL knees) or swelling. decreased range of motion  Psych: change in mood or affect. depression or anxiety.  Neuro: difficulty with speech, weakness, numbness, ataxia    SUBJECTIVE:   VITAL SIGNS: Temp:  [99 F (37.2 C)] 99 F (37.2 C) (03/09 0328) Pulse Rate:  [62-86] 75 (03/09 0815) Resp:  [16-20] 17 (03/09 0815) BP: (134-170)/(63-130) 152/94 mmHg (03/09  0815) SpO2:  [91 %-100 %] 98 % (03/09 0815) FiO2 (%):  [40 %] 40 % (03/09 0730) Weight:  [183.707 kg (405 lb)] 183.707 kg (405 lb) (03/09 0328)  PHYSICAL EXAMINATION: General:  Chronically ill appearing obese male.  Neuro:  Alert, oriented HEENT:  [place]/AT, PERRL Neck:  Supple, Trach in place, no JVD noted Cardiovascular:  RRR Lungs:  diminished bibasilar Abdomen:  Soft, non-tender, non-distended Musculoskeletal:  ROM intact, no acute deformity Skin:  Intact, MMM  Recent Labs Lab 03/24/13 0339  NA 142  K 4.4  CL 103  CO2 29  BUN 20  CREATININE 1.59*  GLUCOSE 110*    Recent Labs Lab 03/24/13 0339  HGB 11.6*  HCT 36.2*  WBC 5.6  PLT 147*   Dg Chest 2 View  03/24/2013   CLINICAL DATA:  Cough.  EXAM: CHEST  2 VIEW  COMPARISON:  11/26/2012 and 11/24/2012  FINDINGS: Tracheostomy tube has tip approximately 7 cm above the carina. Lungs are adequately inflated without focal consolidation or effusion. There is stable cardiomegaly. Remainder the exam is unchanged.  IMPRESSION: No active cardiopulmonary disease.  Stable cardiomegaly.  Tracheostomy tube unchanged.   Electronically Signed   By: Marin Olp M.D.   On: 03/24/2013 08:59   Dg Knee Complete 4 Views Left  03/24/2013   CLINICAL DATA:  Bilateral knee pain.  EXAM: LEFT KNEE - COMPLETE 4+ VIEW  COMPARISON:  11/24/2012.  FINDINGS: Multiple views of the left knee demonstrate no definite acute displaced fracture, subluxation or dislocation. Severe tricompartmental joint space narrowing, subchondral sclerosis, subchondral cyst formation and osteophyte formation, compatible with advanced osteoarthritis, most severe in the medial and patellofemoral compartments. Numerous ossified loose bodies are again noted in the suprapatellar bursa and posterior to the joint space, suspicious for potential synovial osteochondromatosis.  IMPRESSION: 1. No acute abnormality. 2. Chronic degenerative changes of advanced tricompartmental osteoarthritis, most  severe in the medial and patellofemoral compartments, redemonstrated. 3. Changes suggestive of synovial osteochondromatosis again noted.   Electronically Signed   By: Vinnie Langton M.D.   On: 03/24/2013 09:02   Dg Knee Complete 4 Views Right  03/24/2013   CLINICAL DATA:  Bilateral knee pain right greater than left, no known injury  EXAM: RIGHT KNEE - COMPLETE 4+ VIEW  COMPARISON:  11/24/2012  FINDINGS: Mild osseous demineralization.  Tricompartmental osteoarthritic changes with joint space narrowing and marginal spur formation, with bone-on-bone appearance at the medial compartment.  Bulky spur formation and calcified loose bodies and debris at the suprapatellar recess.  Small patellar spur at quadriceps tendon insertion.  Mild infrapatellar soft tissue swelling anteriorly.  No acute fracture, dislocation or bone destruction.  IMPRESSION: Advanced at osteoarthritic changes of the right knee with calcified loose bodies and debris at the suprapatellar recess.   Electronically Signed   By: Lavonia Dana M.D.   On: 03/24/2013 09:11    ASSESSMENT / PLAN:  Chronic Respiratory Failure (Chronic trach, ATC during day and Nocturnal Vent at home).   - ATC during the day, 40% FiO2, humidified.  - Mechanical Ventilation home vent settings, SIMV / Vt 700 / PS 13 / PEEP 5 / RR 6 / 2L FiO2. (Per [facility]) - Will transition to [facility] as inpatient then can go back to SIMV at home. - May bring home vent to hospital if desires to and ok with biomed.  Chest Pain CLE weakness CKD HTN  -Per Primary Team  Georgann Housekeeper, ACNP [facility] Pulmonology/Critical Care Pager [phone] or [phone] 03/24/2013, 9:20 AM  Patient seen and examined, agree with above note.  I dictated the care and orders written for this patient under my direction.  Rush Farmer, MD [phone]

## 2013-03-24 NOTE — ED Provider Notes (Signed)
CSN: [id number]     Arrival date & time 03/24/13  0321 History   First MD Initiated Contact with Patient 03/24/13 [phone]     Chief Complaint  Patient presents with  . Knee Pain     (Consider location/radiation/quality/duration/timing/severity/associated sxs/prior Treatment) HPI Comments: 68 year old male with a history of chronic respiratory failure with a tracheostomy, uses ventilatory support at night, also has a history of chronic deconditioning, congestive heart failure and chronic bilateral knee pain. The patient has been admitted to the hospital in the past because of generalized weakness that required home health and physical therapy. The patient has refused nursing home placement twice while in patient but as alternative used home health. He has not had a home health aid in 3 weeks, he has had increased shaking and weakness of his legs today. This is bilateral, it resulted in his knee giving out and falling to the floor this evening. Family members helped him get to a chair. He states that the pain is same as his constant pain, it is not worse, his knees are not red or swollen and he has no fevers. He also denies any chest pain coughing or shortness of breath.  Patient is a 68 y.o. male presenting with knee pain. The history is provided by the patient and medical records.  Knee Pain Location:  Knee   Past Medical History  Diagnosis Date  . ANXIETY 07/23/2006  . ASTHMA 07/23/2006  . DM 05/23/2007  . HYPERTENSION 07/23/2006  . OSTEOARTHRITIS 07/23/2006  . HYPERCHOLESTEROLEMIA 05/23/2007  . ANEMIA 08/31/2008  . Palpitations 02/10/2008  . ALCOHOL ABUSE, IN REMISSION, HX OF 08/26/2008  . TOBACCO ABUSE, HX OF 08/26/2008  . Pulmonary embolism 03/16/2010  . ED (erectile dysfunction)   . Morbid obesity   . DM retinopathy   . 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 DM neuropathy with neurologic complication   . CHF (congestive heart failure)     With a preserved EF  . Cough  secondary to angiotensin converting enzyme inhibitor (ACE-I)     Cough due to Zestril  . Sleep apnea   . Shortness of breath   . CARCINOMA, PROSTATE 09/29/2009   Past Surgical History  Procedure Laterality Date  . Back surgery    . Tracheostomy     Family History  Problem Relation Age of Onset  . Cancer Neg Hx    History  Substance Use Topics  . Smoking status: Former Smoker -- 0.50 packs/day for 15 years    Types: Cigarettes    Quit date: 01/16/2005  . Smokeless tobacco: Not on file  . Alcohol Use: No    Review of Systems  All other systems reviewed and are negative.      Allergies  Review of patient's allergies indicates no known allergies.  Home Medications   Current Outpatient Rx  Name  Route  Sig  Dispense  Refill  . Alcohol Swabs (ALCOHOL PREP) 70 % PADS               . ALPRAZolam (XANAX) 2 MG tablet   Oral   Take 1 tablet (2 mg total) by mouth at bedtime as needed for anxiety.      0     Continue as previously   . amLODipine (NORVASC) 10 MG tablet      TAKE ONE TABLET BY MOUTH ONCE DAILY   30 tablet   0   . amLODipine (NORVASC) 2.5  MG tablet   Oral   Take 1 tablet (2.5 mg total) by mouth daily.   90 tablet   3   . carvedilol (COREG) 6.25 MG tablet      TAKE ONE TABLET BY MOUTH TWICE DAILY WITH  A  MEAL   60 tablet   0   . citalopram (CELEXA) 40 MG tablet   Oral   Take 1 tablet (40 mg total) by mouth daily.   30 tablet   4   . cloNIDine (CATAPRES) 0.1 MG tablet   Oral   Take 1 tablet (0.1 mg total) by mouth 2 (two) times daily.   60 tablet   1     PT NEEDS TO SCHEDULE OFFICE VISIT WITH DR Loanne Drilling   . EASY COMFORT INSULIN SYRINGE 30G X 1/2" 1 ML MISC               . furosemide (LASIX) 40 MG tablet   Oral   Take 1 tablet (40 mg total) by mouth 2 (two) times daily.   180 tablet   0   . insulin glargine (LANTUS) 100 UNIT/ML injection   Subcutaneous   Inject 0.25 mLs (25 Units total) into the skin every morning.   10  mL   3   . KLOR-CON M20 20 MEQ tablet      TAKE TWO TABLETS BY MOUTH ONCE DAILY   60 tablet   0   . lactulose (CHRONULAC) 10 GM/15ML solution   Oral   Take 30 mLs (20 g total) by mouth 2 (two) times daily.   240 mL   11   . losartan (COZAAR) 100 MG tablet   Oral   Take 0.5 tablets (50 mg total) by mouth daily.   30 tablet   11   . metoCLOPramide (REGLAN) 5 MG tablet      TAKE ONE TABLET BY MOUTH THREE TIMES DAILY   90 tablet   0   . Multiple Vitamin (MULTIVITAMIN) tablet   Oral   Take 1 tablet by mouth daily.          Marland Kitchen oxyCODONE-acetaminophen (PERCOCET) 10-325 MG per tablet   Oral   Take 1 tablet by mouth every 4 (four) hours as needed for pain.   100 tablet   0   . simvastatin (ZOCOR) 20 MG tablet   Oral   Take 1 tablet (20 mg total) by mouth daily.   90 tablet   0     PT NEEDS TO SCHEDULE AN APPT WITH DR Loanne Drilling   . warfarin (COUMADIN) 10 MG tablet      TAKE AS DIRECTED   30 tablet   0    BP 145/82  Pulse 68  Temp(Src) 99 F (37.2 C) (Oral)  Resp 19  SpO2 98% Physical Exam  Nursing note and vitals reviewed. Constitutional: He appears well-developed and well-nourished. No distress.  HENT:  Head: Normocephalic and atraumatic.  Mouth/Throat: Oropharynx is clear and moist. No oropharyngeal exudate.  Eyes: Conjunctivae and EOM are normal. Pupils are equal, round, and reactive to light. Right eye exhibits no discharge. Left eye exhibits no discharge. No scleral icterus.  Neck: Normal range of motion. Neck supple. No JVD present. No thyromegaly present.  Tracheostomy present, no secretions, no redness  Cardiovascular: Normal rate, regular rhythm, normal heart sounds and intact distal pulses.  Exam reveals no gallop and no friction rub.   No murmur heard. Pulmonary/Chest: Effort normal and breath sounds normal. No respiratory distress. He  has no wheezes. He has no rales.  Abdominal: Soft. Bowel sounds are normal. He exhibits no distension and no  mass. There is no tenderness.  Musculoskeletal: Normal range of motion. He exhibits edema. He exhibits no tenderness.  Severe bilateral edema, mildly pitting, strong pulses at the dorsalis pedis bilaterally. Bilateral knees with normal range of motion, there is crepitus felt in each knee with range of motion. He is able to straight leg raise bilaterally.  Lymphadenopathy:    He has no cervical adenopathy.  Neurological: He is alert. Coordination normal.  Normal strength sensation and speech  Skin: Skin is warm and dry. No rash noted. No erythema.  Minimal erythema to the bilateral lower extremities, hyperpigmented skin over edematous skin  Psychiatric: He has a normal mood and affect. His behavior is normal.    ED Course  Procedures (including critical care time) Labs Review Labs Reviewed  COMPREHENSIVE METABOLIC PANEL - Abnormal; Notable for the following:    Glucose, Bld 110 (*)    Creatinine, Ser 1.59 (*)    Albumin 3.0 (*)    Total Bilirubin 0.2 (*)    GFR calc non Af Amer 43 (*)    GFR calc Af Amer 50 (*)    All other components within normal limits  CBC - Abnormal; Notable for the following:    RBC 4.00 (*)    Hemoglobin 11.6 (*)    HCT 36.2 (*)    Platelets 147 (*)    All other components within normal limits  URINALYSIS, ROUTINE W REFLEX MICROSCOPIC - Abnormal; Notable for the following:    Specific Gravity, Urine 1.038 (*)    Bilirubin Urine SMALL (*)    Ketones, ur 15 (*)    Protein, ur >300 (*)    All other components within normal limits  URINE MICROSCOPIC-ADD ON - Abnormal; Notable for the following:    Bacteria, UA FEW (*)    Casts GRANULAR CAST (*)    All other components within normal limits   Imaging Review No results found.   EKG Interpretation None      MDM   Final diagnoses:  Generalized weakness  Dehydration    We'll check reasons for the patient to have given out weakness or shaking. He has no tremor or seizure on exam, he is able to  straight leg raise and has normal strength bilaterally. The patient is generally deconditioned, he does walk with a walker. He denies that he is having increased pain. I doubt that he has a septic arthritis, we'll check electrolytes, fluid status, urinalysis.  Labs unremarkable - dehdyration only - pt unable to walk, will admit.  D/w Dr. Hal Hope.  Meds given in ED:  Medications  oxyCODONE-acetaminophen (PERCOCET/ROXICET) 5-325 MG per tablet 2 tablet (2 tablets Oral Given 03/24/13 0410)  sodium chloride 0.9 % bolus 500 mL (500 mLs Intravenous New Bag/Given 03/24/13 0530)      Johnna Acosta, MD 03/24/13 [phone]

## 2013-03-24 NOTE — ED Notes (Signed)
Pt to xray w/ resp

## 2013-03-24 NOTE — ED Notes (Signed)
MD at bedside. 

## 2013-03-24 NOTE — ED Notes (Signed)
Ordered diet tray 

## 2013-03-24 NOTE — ED Notes (Signed)
Patient is resting comfortably. 

## 2013-03-24 NOTE — Progress Notes (Signed)
Pt came to floor from ED. Pt is alert and oriented x 4. Pt has a trach and is on trach collar and respiratory came with nurse to bring pt to floor. Vent brought for night time use per md orders. Pt given a CHG bath and swabbed nares for MRSA. Called telemetry and notified. Giving report to night nurse.

## 2013-03-24 NOTE — Progress Notes (Signed)
ANTICOAGULATION CONSULT NOTE - Initial Consult  Pharmacy Consult for Coumadin Indication: pulmonary embolus  No Known Allergies  Patient Measurements: Weight: 405 lb (183.707 kg) Heparin Dosing Weight:    Vital Signs: Temp: 99 F (37.2 C) (03/09 0328) Temp src: Oral (03/09 0328) BP: 142/80 mmHg (03/09 0759) Pulse Rate: 76 (03/09 0759)  Labs:  Recent Labs  03/24/13 0339  HGB 11.6*  HCT 36.2*  PLT 147*  CREATININE 1.59*    The CrCl is unknown because both a height and weight (above a minimum accepted value) are required for this calculation.   Medical History: Past Medical History  Diagnosis Date  . ANXIETY 07/23/2006  . ASTHMA 07/23/2006  . DM 05/23/2007  . HYPERTENSION 07/23/2006  . OSTEOARTHRITIS 07/23/2006  . HYPERCHOLESTEROLEMIA 05/23/2007  . ANEMIA 08/31/2008  . Palpitations 02/10/2008  . ALCOHOL ABUSE, IN REMISSION, HX OF 08/26/2008  . TOBACCO ABUSE, HX OF 08/26/2008  . Pulmonary embolism 03/16/2010  . ED (erectile dysfunction)   . Morbid obesity   . DM retinopathy   . 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 DM neuropathy with neurologic complication   . CHF (congestive heart failure)     With a preserved EF  . 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 Sleep apnea   . Shortness of breath   . CARCINOMA, PROSTATE 09/29/2009    Medications:  Coumadin 10mg  MWF, 5mg  TTSS  Assessment: c/o knee pain and LE weakness, CP  68 y/o M with chronic respiratory failure on TC (2006) presents with weakness in LE and knee pain resulting in fall 3/8. He has chronic deconditioning and previously refused NH placement. Patient was on Coumadin PTA for h/o PE noted 03/16/10 with admit INR 2.34 (10mg  MWF, 5mg  TTSS).  Goal of Therapy:  INR 2-3 Monitor platelets by anticoagulation protocol: Yes   Plan:  Resume home Coumadin regimen 10mg  MWF, 5mg  TTSS Daily INR  Chizuko Trine S. Alford Highland, PharmD, BCPS Clinical  Staff Pharmacist Pager [phone]  Eilene Ghazi Stillinger 03/24/2013,8:59 AM

## 2013-03-24 NOTE — ED Notes (Signed)
Pt. back from Xray, Tracheal Aspirate obtained/labelled/sent to lab, RN charted collection, awaiting room assignment, RT to monitor.

## 2013-03-24 NOTE — ED Notes (Signed)
Pt called needs to be suctioned resp notified meds ordered from pharmacy  Pt resting no c/o discomfort

## 2013-03-24 NOTE — ED Notes (Signed)
Admit dr into see pt and bed changed to step down pt suctioned per resp

## 2013-03-24 NOTE — ED Notes (Signed)
Pt. arrived [facility] prior to my arrival, found on 40% humidified ATC with 7.0 XLT(distal) cuffed,(cuff down), for weakness, sx'd for small amount tan/yellow sputum, s/u Trach collar for transport to Xray, inner cannula patent/due to be chg'd, once admitted to floor, Trach ties chg'd, A-11 filled out/plan to send to [facility] once step down bed assigned for required b/u equipment, [place] [facility] phone number@ [place] given to Son-in-law @ bedside, pt. followed by Dr. Lamonte Sakai with [facility].

## 2013-03-24 NOTE — ED Notes (Signed)
Called pharmacy to change pain medicine order so we can get out of pyxis.

## 2013-03-24 NOTE — ED Notes (Signed)
Pt. uses Ventilator @ h/s, RT to monitor.

## 2013-03-24 NOTE — ED Notes (Signed)
According to EMS, the patient's family called due to the patient complaining to his family about knee pain.  EMS said the family reported that the patient was walking (with a walker) to his bedroom to go to bed and his knees "gave out on him".  EMS said the family helped him to a chair in the kitchen and they called 911.  Patient rated his pain 8/10 in both knees.  The patient is a diabetic however his BS on the seen was 137.  The patient was transported to the [facility] to be evaluated.

## 2013-03-24 NOTE — ED Notes (Signed)
Additional back-up 7.0 Shiley XLT delivered from [facility]

## 2013-03-25 DIAGNOSIS — R509 Fever, unspecified: Secondary | ICD-10-CM

## 2013-03-25 DIAGNOSIS — E662 Morbid (severe) obesity with alveolar hypoventilation: Secondary | ICD-10-CM

## 2013-03-25 LAB — CBC
HCT: 34.3 % — ABNORMAL LOW (ref 39.0–52.0)
Hemoglobin: 11.1 g/dL — ABNORMAL LOW (ref 13.0–17.0)
MCH: 29.3 pg (ref 26.0–34.0)
MCHC: 32.4 g/dL (ref 30.0–36.0)
MCV: 90.5 fL (ref 78.0–100.0)
Platelets: 157 10*3/uL (ref 150–400)
RBC: 3.79 MIL/uL — ABNORMAL LOW (ref 4.22–5.81)
RDW: 13.1 % (ref 11.5–15.5)
WBC: 5.2 10*3/uL (ref 4.0–10.5)

## 2013-03-25 LAB — BASIC METABOLIC PANEL
BUN: 16 mg/dL (ref 6–23)
CO2: 31 meq/L (ref 19–32)
CREATININE: 1.39 mg/dL — AB (ref 0.50–1.35)
Calcium: 8.5 mg/dL (ref 8.4–10.5)
Chloride: 103 mEq/L (ref 96–112)
GFR calc non Af Amer: 51 mL/min — ABNORMAL LOW (ref >90)
GFR, EST AFRICAN AMERICAN: 59 mL/min — AB (ref >90)
Glucose, Bld: 93 mg/dL (ref 70–99)
Potassium: 4.4 mEq/L (ref 3.7–5.3)
Sodium: 141 mEq/L (ref 137–147)

## 2013-03-25 LAB — GLUCOSE, CAPILLARY
GLUCOSE-CAPILLARY: 143 mg/dL — AB (ref 70–99)
GLUCOSE-CAPILLARY: 152 mg/dL — AB (ref 70–99)
Glucose-Capillary: 80 mg/dL (ref 70–99)
Glucose-Capillary: 130 mg/dL — ABNORMAL HIGH (ref 70–99)

## 2013-03-25 LAB — INFLUENZA PANEL BY PCR (TYPE A & B)
H1N1 flu by pcr: NOT DETECTED
Influenza A By PCR: NEGATIVE
Influenza B By PCR: NEGATIVE

## 2013-03-25 LAB — PROTIME-INR
INR: 1.8 — AB (ref 0.00–1.49)
PROTHROMBIN TIME: 20.4 s — AB (ref 11.6–15.2)

## 2013-03-25 MED ORDER — WARFARIN SODIUM 10 MG PO TABS
10.0000 mg | ORAL_TABLET | Freq: Once | ORAL | Status: AC
  Administered 2013-03-25: 10 mg via ORAL
  Filled 2013-03-25: qty 1

## 2013-03-25 NOTE — Progress Notes (Signed)
[facility] Progress Note [place] TEAM 1 - Stepdown/ICU TEAM   [name] [id number] DOB: Jun 26, 1945 DOA: 03/24/2013 PCP: Renato Shin, MD  Brief narrative: Travis Chapman is a 68 y.o. 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    Subjective: Still feeling quite weak. Chest pain on and off. Admits to 20 lb weight gain.  Assessment/Plan: Principal Problem:   Chest pain - atypical - cardiac enzymes negative. Likely muscles spasm.  Active Problems: Fever - acute bronchitis? Per pt his is not coughing. UA negative. No GI symptoms. Influenza negative and no leukocytosis.  Rt knee xray w/o effusion.    CARDIOMYOPATHY--  Diastolic HF (heart failure) - stable    KNEE PAIN Osteoarthritis - currently no pain    Lower extremity weakness---  Generalized weakness - admitted for the same in the fall - Per PT eval will need SNF - I have advised him to lose weight    Chronic respiratory failure/ OSA OHS--  Ventilator dependence--Tracheostomy status - stable  DM - A1c7.3  - sugars stable  Morbid obesity - advised to lose weight- does not appear to be fluid weight  CKD 3 - stable  HTN - stable  H/o PE - cont coumadin- INR low  Code Status: full code Family Communication: none Disposition Plan: PT eval  Consultants: PCCM  Procedures: none  Antibiotics: Antibiotics Given (last 72 hours)   None       DVT prophylaxis: Coumadin for h/o  PE  Objective: Filed Weights   03/24/13 0328 03/24/13 1900  Weight: 183.707 kg (405 lb) 169.645 kg (374 lb)   Blood pressure 116/64, pulse 71, temperature 98.9 F (37.2 C), temperature source Oral, resp. rate 20, height 6\' 2"  (1.88 m), weight 169.645 kg (374 lb), SpO2 97.00%.  Intake/Output Summary (Last 24 hours) at 03/25/13 1821 Last data filed at 03/25/13 1648  Gross per 24 hour  Intake      0 ml  Output   1665 ml  Net  -1665 ml     Exam: General: No acute respiratory distress- morbidly obese- sitting up in chair Lungs: Clear to auscultation bilaterally without wheezes or crackles- Trach intact Cardiovascular: Regular rate and rhythm without murmur gallop or rub normal S1 and S2 Abdomen: Nontender, nondistended, soft, bowel sounds positive, no rebound, no ascites, no appreciable mass Extremities: No significant cyanosis, clubbing, or edema bilateral lower extremities  Data Reviewed: Basic Metabolic Panel:  Recent Labs Lab 03/24/13 0339 03/25/13 0250  NA 142 141  K 4.4 4.4  CL 103 103  CO2 29 31  GLUCOSE 110* 93  BUN 20 16  CREATININE 1.59* 1.39*  CALCIUM 8.7 8.5   Liver Function Tests:  Recent Labs Lab 03/24/13 0339  AST 16  ALT 12  ALKPHOS 74  BILITOT 0.2*  PROT 6.9  ALBUMIN 3.0*   No results found for this basename: LIPASE, AMYLASE,  in the last 168 hours No results found for this basename: AMMONIA,  in the last 168 hours CBC:  Recent Labs Lab 03/24/13 0339 03/25/13 0250  WBC  5.6 5.2  HGB 11.6* 11.1*  HCT 36.2* 34.3*  MCV 90.5 90.5  PLT 147* 157   Cardiac Enzymes:  Recent Labs Lab 03/24/13 0945 03/24/13 1411 03/24/13 2019  TROPONINI <0.30 <0.30 <0.30   BNP (last 3 results)  Recent Labs  11/24/12 1250 11/26/12 0600 03/10/13 1630  PROBNP 264.4* 596.6* 58.0   CBG:  Recent Labs Lab 03/24/13 1221 03/24/13 1609 03/24/13 2111 03/25/13 0736 03/25/13 1245  GLUCAP 103* 90 150* 80 143*    Recent Results (from the past 240  hour(s))  CULTURE, RESPIRATORY (NON-EXPECTORATED)     Status: None   Collection Time    03/24/13  9:17 AM      Result Value Ref Range Status   Specimen Description TRACHEAL ASPIRATE   Final   Special Requests Normal   Final   Gram Stain     Final   Value: ABUNDANT WBC PRESENT, PREDOMINANTLY PMN     RARE SQUAMOUS EPITHELIAL CELLS PRESENT     ABUNDANT GRAM NEGATIVE COCCI     ABUNDANT GRAM NEGATIVE RODS     ABUNDANT GRAM POSITIVE RODS   Culture     Final   Value: Culture reincubated for better growth     Performed at [facility]   Report Status PENDING   Incomplete  MRSA PCR SCREENING     Status: None   Collection Time    03/24/13  7:09 PM      Result Value Ref Range Status   MRSA by PCR NEGATIVE  NEGATIVE Final   Comment:            The GeneXpert MRSA Assay (FDA     approved for NASAL specimens     only), is one component of a     comprehensive MRSA colonization     surveillance program. It is not     intended to diagnose MRSA     infection nor to guide or     monitor treatment for     MRSA infections.     Studies:  Recent x-ray studies have been reviewed in detail by the Attending Physician  Scheduled Meds:  Scheduled Meds: . amLODipine  10 mg Oral Daily  . aspirin  81 mg Oral Daily  . carvedilol  6.25 mg Oral BID WC  . citalopram  40 mg Oral Daily  . cloNIDine  0.1 mg Oral BID  . guaiFENesin  600 mg Oral BID  . insulin aspart  0-9 Units Subcutaneous TID WC  . insulin glargine  25 Units Subcutaneous q morning - 10a  . lactulose  20 g Oral BID  . metoCLOPramide  5 mg Oral TID AC & HS  . multivitamin with minerals  1 tablet Oral Daily  . simvastatin  20 mg Oral Daily  . sodium chloride  3 mL Intravenous Q12H  . Warfarin - Pharmacist Dosing Inpatient   Does not apply q1800   Continuous Infusions:   Time spent on care of this patient: >35 min   Debbe Odea, MD  [facility] Office  [phone] Pager - Text Page per Shea Evans as per  below:  On-Call/Text Page:      [email or web address]  If 7PM-7AM, please contact night-coverage [email or web address] 03/25/2013, 6:21 PM   LOS: 1 day

## 2013-03-25 NOTE — ED Notes (Signed)
Pt. transported on 40%/8L Trach collar from ED, humidified s/u placed back on once arrived to 2C08 and placed on special bed, b/u Trach equipment with pt., uneventful.

## 2013-03-25 NOTE — Consult Note (Signed)
PULMONARY / CRITICAL CARE MEDICINE  Name: Travis Chapman MRN: [id number] DOB: 1945/03/15    ADMISSION DATE:  03/24/2013 CONSULTATION DATE:  03/24/2013  REFERRING MD :  EDP PRIMARY SERVICE:  TRH  CHIEF COMPLAINT:  BLE weakness  BRIEF PATIENT DESCRIPTION: 68 year old male, trach 2006 requiring nocturnal vent. Presents 3/9 with BLE weakness and atypical chest pain x 2 weeks. Office pt of Dr. Lamonte Sakai.    SIGNIFICANT EVENTS / STUDIES:  3/9  Admitted to [place] / TUBES: Trach 2006 >>>  CULTURES: Sputum 3/9 >>>  ANTIBIOTICS:  INTERVAL HISTORY:  No acute overnight events.  No complaints.  VITAL SIGNS: Temp:  [99.1 F (37.3 C)-100.4 F (38 C)] 99.1 F (37.3 C) (03/10 1117) Pulse Rate:  [68-85] 73 (03/10 1209) Resp:  [12-25] 25 (03/10 1209) BP: (97-165)/(51-90) 145/59 mmHg (03/10 1117) SpO2:  [92 %-100 %] 95 % (03/10 1209) FiO2 (%):  [28 %-30 %] 28 % (03/10 1209) Weight:  [169.645 kg (374 lb)] 169.645 kg (374 lb) (03/09 1900)  PHYSICAL EXAMINATION: General:  Resting in no distress Neuro:  Alert, oriented HEENT:  Moist membranes Neck:  Tracheostomy site intact Cardiovascular:  Regular, distant heart sounds Lungs: Bilateral air entry, no added sounds Abdomen:  Obese, soft Musculoskeletal:  Trace edema Skin:  Intact, No rash  LABS:  Recent Labs Lab 03/24/13 0339 03/25/13 0250  NA 142 141  K 4.4 4.4  CL 103 103  CO2 29 31  BUN 20 16  CREATININE 1.59* 1.39*  GLUCOSE 110* 93    Recent Labs Lab 03/24/13 0339 03/25/13 0250  HGB 11.6* 11.1*  HCT 36.2* 34.3*  WBC 5.6 5.2  PLT 147* 157   IMAGING:  Dg Chest 2 View  03/24/2013   CLINICAL DATA:  Cough.  EXAM: CHEST  2 VIEW  COMPARISON:  11/26/2012 and 11/24/2012  FINDINGS: Tracheostomy tube has tip approximately 7 cm above the carina. Lungs are adequately inflated without focal consolidation or effusion. There is stable cardiomegaly. Remainder the exam is unchanged.  IMPRESSION: No active cardiopulmonary disease.  Stable  cardiomegaly.  Tracheostomy tube unchanged.   Electronically Signed   By: Marin Olp M.D.   On: 03/24/2013 08:59   Dg Knee Complete 4 Views Left  03/24/2013   CLINICAL DATA:  Bilateral knee pain.  EXAM: LEFT KNEE - COMPLETE 4+ VIEW  COMPARISON:  11/24/2012.  FINDINGS: Multiple views of the left knee demonstrate no definite acute displaced fracture, subluxation or dislocation. Severe tricompartmental joint space narrowing, subchondral sclerosis, subchondral cyst formation and osteophyte formation, compatible with advanced osteoarthritis, most severe in the medial and patellofemoral compartments. Numerous ossified loose bodies are again noted in the suprapatellar bursa and posterior to the joint space, suspicious for potential synovial osteochondromatosis.  IMPRESSION: 1. No acute abnormality. 2. Chronic degenerative changes of advanced tricompartmental osteoarthritis, most severe in the medial and patellofemoral compartments, redemonstrated. 3. Changes suggestive of synovial osteochondromatosis again noted.   Electronically Signed   By: Vinnie Langton M.D.   On: 03/24/2013 09:02   Dg Knee Complete 4 Views Right  03/24/2013   CLINICAL DATA:  Bilateral knee pain right greater than left, no known injury  EXAM: RIGHT KNEE - COMPLETE 4+ VIEW  COMPARISON:  11/24/2012  FINDINGS: Mild osseous demineralization.  Tricompartmental osteoarthritic changes with joint space narrowing and marginal spur formation, with bone-on-bone appearance at the medial compartment.  Bulky spur formation and calcified loose bodies and debris at the suprapatellar recess.  Small patellar spur at quadriceps tendon insertion.  Mild infrapatellar soft tissue swelling anteriorly.  No acute fracture, dislocation or bone destruction.  IMPRESSION: Advanced at osteoarthritic changes of the right knee with calcified loose bodies and debris at the suprapatellar recess.   Electronically Signed   By: Lavonia Dana M.D.   On: 03/24/2013 09:11     ASSESSMENT / PLAN:  OSA / OHS Tracheostomy status Chronic respiratory failure on nocturnal ventilator  -->  Goal SpO2>92 -->  ATC during daytime hours -->  Nocturnal ventilator support -->  May use home ventilator -->  Albuterol PRN -->  Will follow intermittently   I have personally obtained history, examined patient, evaluated and interpreted laboratory and imaging results, reviewed medical records, formulated assessment / plan and placed orders.  Doree Fudge, MD Pulmonary and [place] Pager: [phone]  03/25/2013, 1:46 PM

## 2013-03-25 NOTE — Progress Notes (Signed)
UR completed 

## 2013-03-25 NOTE — Progress Notes (Signed)
ANTICOAGULATION CONSULT NOTE  Pharmacy Consult for Coumadin Indication: pulmonary embolus  No Known Allergies  Patient Measurements: Height: 6\' 2"  (188 cm) Weight: 374 lb (169.645 kg) IBW/kg (Calculated) : 82.2 Heparin Dosing Weight:    Vital Signs: Temp: 99.4 F (37.4 C) (03/10 0730) Temp src: Oral (03/10 0730) BP: 157/79 mmHg (03/10 0730) Pulse Rate: 80 (03/10 0847)  Labs:  Recent Labs  03/24/13 0339 03/24/13 0945 03/24/13 1411 03/24/13 2019 03/25/13 0250  HGB 11.6*  --   --   --  11.1*  HCT 36.2*  --   --   --  34.3*  PLT 147*  --   --   --  157  LABPROT  --  24.9*  --   --  20.4*  INR  --  2.34*  --   --  1.80*  CREATININE 1.59*  --   --   --  1.39*  TROPONINI  --  <0.30 <0.30 <0.30  --     Estimated Creatinine Clearance: 85.5 ml/min (by C-G formula based on Cr of 1.39).   Medications:  Coumadin 10mg  MWF, 5mg  TTSS  Assessment: c/o knee pain and LE weakness, CP  68 y/o M with chronic respiratory failure on TC (2006) presents with weakness in LE and knee pain resulting in fall 3/8. He has chronic deconditioning and previously refused NH placement. Patient was on Coumadin PTA for h/o PE noted 03/16/10 with admit INR 2.34 (10mg  MWF, 5mg  TTSS).  INR today decreased some to 1.8  Goal of Therapy:  INR 2-3 Monitor platelets by anticoagulation protocol: Yes   Plan:  1) Coumadin 10 mg po x 1 dose today (deviation from home dose due to decreased INR) 2) Daily INR  Thank you. Anette Guarneri, PharmD [phone]  03/25/2013,9:17 AM

## 2013-03-25 NOTE — Progress Notes (Signed)
Rehab Admissions Coordinator Note:  Patient was screened by Cleatrice Burke for appropriateness for an Inpatient Acute Rehab Consult per OT recommendation. At this time, we are recommending [facility]. [facility] inpatient rehab facility currently does not accept nocturnal vents. Please call me if any questions.  Cleatrice Burke 03/25/2013, 7:27 PM  I can be reached at [phone].

## 2013-03-25 NOTE — Progress Notes (Addendum)
Occupational Therapy Evaluation Patient Details Name: Travis Chapman MRN: [id number] DOB: 1945/07/18 Today's Date: 03/25/2013 Time: 1601-0932 OT Time Calculation (min): 18 min  OT Assessment / Plan / Recommendation History of present illness Pt admit with bil LE weakness, CP and PE.  Long term trach since 2006.     Clinical Impression   PTA, pt lived with his mother and was mod I with all ADL. States he had more difficulty with ADL  Prior to admission due to increased weakness. Pt presents with below deficits. At this time,feel pt is appropriate for CIR to mazimize with ADL/ functional level of return to facilitate D/C home with wife. Pt independently manages his own vent at night. Pt will benefit from skilled OT services to facilitate D/C to next venue due to below deficits.    OT Assessment  Patient needs continued OT Services    Follow Up Recommendations    CIR   Barriers to Discharge      Equipment Recommendations  None recommended by OT    Recommendations for Other Services Rehab consult  Frequency  Min 2X/week    Precautions / Restrictions Precautions Precautions: Fall Precaution Comments: trach Restrictions Weight Bearing Restrictions: No   Pertinent Vitals/Pain no apparent distress     ADL  Grooming: Supervision/safety;Set up Where Assessed - Grooming: Unsupported sitting Upper Body Bathing: Minimal assistance Where Assessed - Upper Body Bathing: Unsupported sitting Lower Body Bathing: Maximal assistance Where Assessed - Lower Body Bathing: Supported sit to stand Upper Body Dressing: Minimal assistance Where Assessed - Upper Body Dressing: Supported sitting Lower Body Dressing: Maximal assistance Where Assessed - Lower Body Dressing: Supported sit to Lobbyist: +2 Total assistance Equipment Used: Gait belt Transfers/Ambulation Related to ADLs: +2 physical assist needed ADL Comments: Daughter states pt could do all ADL PTA...when he wants to.    OT  Diagnosis: Generalized weakness  OT Problem List: Decreased strength;Decreased activity tolerance;Decreased knowledge of use of DME or AE;Cardiopulmonary status limiting activity;Obesity OT Treatment Interventions: Self-care/ADL training;Therapeutic exercise;Energy conservation;DME and/or AE instruction;Therapeutic activities;Cognitive remediation/compensation;Patient/family education;Balance training   OT Goals(Current goals can be found in the care plan section) Acute Rehab OT Goals Patient Stated Goal: to go home OT Goal Formulation: With patient/family Time For Goal Achievement: 04/08/13 Potential to Achieve Goals: Good  Visit Information  Last OT Received On: 03/25/13 Assistance Needed: +2 History of Present Illness: Pt admit with bil LE weakness, CP and PE.  Long term trach since 2006.         Prior [place] expects to be discharged to:: Private residence Living Arrangements: Parent (Lives with 10 year old mom, MEals on wheels and daughter coo) Available Help at Discharge: Family;Available 24 hours/day (elderly mom ) Type of Home: House Home Access: Stairs to enter [employer] of Steps: 3 Entrance Stairs-Rails: Right;Left;Can reach both Home Layout: One level Home Equipment: Walker - 2 wheels;Other (comment) (vent,O2) Additional Comments: Pt wears vent at night time and has O2 just in case. Pt has been on vent for 8 years.  Prior Function Level of Independence: Independent with assistive device(s) Communication Communication: Tracheostomy (chronic trach and able to communicate) Dominant Hand: Right         Vision/Perception Vision - History Baseline Vision: Other (comment) Patient Visual Report:  (R eye drainage?)   Cognition  Cognition Arousal/Alertness: Awake/alert Behavior During Therapy: WFL for tasks assessed/performed Overall Cognitive Status: Within Functional Limits for tasks assessed    Extremity/Trunk  Assessment Upper  Extremity Assessment Upper Extremity Assessment: Generalized weakness (but functional) Lower Extremity Assessment Lower Extremity Assessment: Generalized weakness Cervical / Trunk Assessment Cervical / Trunk Assessment: Normal     Mobility Transfers Overall transfer level: Needs assistance Equipment used: Rolling walker (2 wheeled) Transfers: Sit to/from Stand Sit to Stand: +2 physical assistance;From elevated surface;Mod assist;Max assist Stand pivot transfers: Mod assist;+2 physical assistance General transfer comment: Pt could not complete transitional movements in order to come to upright standing.      Exercise Other Exercises Other Exercises: encouraged pt to complete B U/LE AROM   Balance Balance Sitting-balance support: Feet supported Sitting balance-Leahy Scale: Good   End of Session OT - End of Session Equipment Utilized During Treatment: Gait belt;Rolling walker Activity Tolerance: Patient tolerated treatment well Patient left: in chair;with call bell/phone within reach;with nursing/sitter in room;with family/visitor present Nurse Communication: Mobility status  GO Functional Assessment Tool Used: clinical judgement Functional Limitation: Self care Self Care Current Status (W9798): At least 60 percent but less than 80 percent impaired, limited or restricted Self Care Goal Status (X2119): At least 20 percent but less than 40 percent impaired, limited or restricted   Travis Chapman,Travis Chapman 03/25/2013, 5:02 PM [facility], OTR/L  [phone] 03/25/2013

## 2013-03-25 NOTE — Evaluation (Signed)
Physical Therapy Evaluation Patient Details Name: Travis Chapman MRN: [id number] DOB: 1945-03-15 Today's Date: 03/25/2013 Time: 1036-1100 PT Time Calculation (min): 24 min  PT Assessment / Plan / Recommendation History of Present Illness  Pt admit with bil LE weakness, CP and PE.  Long term trach since 2006.    Clinical Impression  Pt admitted with above. Pt currently with functional limitations due to the deficits listed below (see PT Problem List).  Pt will benefit from skilled PT to increase their independence and safety with mobility to allow discharge to the venue listed below.     PT Assessment  Patient needs continued PT services    Follow Up Recommendations  SNF;Supervision/Assistance - 24 hour (May need SNF depending on progress over next few days. )    Does the patient have the potential to tolerate intense rehabilitation      Barriers to Discharge Decreased caregiver support pt lives with 71 year old mother    Equipment Recommendations  Rolling walker with 5" wheels;3in1 (PT) (Pt needs bariatric RW  and 3N1 for > 374 pounds.  )    Recommendations for Other Services     Frequency Min 3X/week    Precautions / Restrictions Precautions Precautions: Fall Restrictions Weight Bearing Restrictions: No   Pertinent Vitals/Pain VSS, No pain      Mobility  Bed Mobility Overal bed mobility: Needs Assistance;+2 for physical assistance Bed Mobility: Supine to Sit Supine to sit: Max assist;+2 for physical assistance General bed mobility comments: Took incr time and feel that the bed hindered his ability to get out of it due to height and armrest on side of bed.  Needed asssit for LEs and elevation of trunk.   Transfers Overall transfer level: Needs assistance Equipment used: Rolling walker (2 wheeled) Transfers: Sit to/from [employer] Sit to Stand: +2 physical assistance;From elevated surface;Mod assist;Max assist Stand pivot transfers: Mod assist;+2  physical assistance General transfer comment: Pt initially could not get left LE under him as it was externally rotated and abducted.  Pt could not push up from bed with hands but allowed pt to pull up on RW and with bed elevated, pt was able to perform sit to stand.  Once up pt got balance and was able to stand and pivot around to recliner and was able to weight shift without assistance.  Pt with very wide BOS.  Needed assist to control descent into chair as well.       Exercises General Exercises - Lower Extremity Ankle Circles/Pumps: AROM;Both;10 reps;Seated Long Arc Quad: AROM;Both;10 reps;Seated Hip Flexion/Marching: AROM;Both;10 reps;Seated   PT Diagnosis: Generalized weakness  PT Problem List: Decreased activity tolerance;Decreased balance;Decreased strength;Decreased mobility;Decreased knowledge of use of DME;Decreased safety awareness;Decreased knowledge of precautions PT Treatment Interventions: DME instruction;Gait training;Functional mobility training;Therapeutic activities;Therapeutic exercise;Balance training;Patient/family education     PT Goals(Current goals can be found in the care plan section) Acute Rehab PT Goals Patient Stated Goal: to go home PT Goal Formulation: With patient Time For Goal Achievement: 04/01/13 Potential to Achieve Goals: Good  Visit Information  Last PT Received On: 03/25/13 Assistance Needed: +2 History of Present Illness: Pt admit with bil LE weakness, CP and PE.  Long term trach since 2006.         Prior [place] expects to be discharged to:: Private residence Living Arrangements: Parent (Lives with 24 year old mom, MEals on wheels and daughter coo) Available Help at Discharge: Family;Available 24 hours/day (elderly mom ) Type of  Home: House Home Access: Stairs to enter [employer] of Steps: 3 Entrance Stairs-Rails: Right;Left;Can reach both Home Layout: One level Home Equipment: [place] - 2  wheels;Other (comment) (vent,O2) Additional Comments: Pt wears vent at night time and has O2 just in case. Pt has been on vent for 8 years.  Prior Function Level of Independence: Independent with assistive device(s) Communication Communication: Tracheostomy (chronic trach and able to communicate) Dominant Hand: Right    Cognition  Cognition Arousal/Alertness: Awake/alert Behavior During Therapy: WFL for tasks assessed/performed Overall Cognitive Status: Within Functional Limits for tasks assessed    Extremity/Trunk Assessment Upper Extremity Assessment Upper Extremity Assessment: Defer to OT evaluation Lower Extremity Assessment Lower Extremity Assessment: Generalized weakness Cervical / Trunk Assessment Cervical / Trunk Assessment: Normal   Balance Balance Overall balance assessment: Needs assistance;History of Falls Sitting-balance support: Bilateral upper extremity supported;Feet supported Sitting balance-Leahy Scale: Fair Sitting balance - Comments: Can sit EOB with supervision.   Standing balance support: Bilateral upper extremity supported;During functional activity Standing balance-Leahy Scale: Poor Standing balance comment: Pt relying heavily on UEs and RW for support.    End of Session PT - End of Session Equipment Utilized During Treatment: Gait belt;Oxygen (trach) Activity Tolerance: Patient limited by fatigue Patient left: in chair;with call bell/phone within reach Nurse Communication: Mobility status  GP Functional Assessment Tool Used: clinical judgment Functional Limitation: Mobility: Walking and moving around Mobility: Walking and Moving Around Current Status (D4287): At least 60 percent but less than 80 percent impaired, limited or restricted Mobility: Walking and Moving Around Goal Status [phone]): At least 1 percent but less than 20 percent impaired, limited or restricted   Chapman,Travis Saavedra 03/25/2013, 11:44 AM [facility] Acute  Rehabilitation [phone] (pager)

## 2013-03-26 ENCOUNTER — Encounter (INDEPENDENT_AMBULATORY_CARE_PROVIDER_SITE_OTHER): Payer: Medicare Other

## 2013-03-26 DIAGNOSIS — I2699 Other pulmonary embolism without acute cor pulmonale: Secondary | ICD-10-CM

## 2013-03-26 LAB — GLUCOSE, CAPILLARY
GLUCOSE-CAPILLARY: 121 mg/dL — AB (ref 70–99)
GLUCOSE-CAPILLARY: 130 mg/dL — AB (ref 70–99)
Glucose-Capillary: 119 mg/dL — ABNORMAL HIGH (ref 70–99)
Glucose-Capillary: 214 mg/dL — ABNORMAL HIGH (ref 70–99)

## 2013-03-26 LAB — PROTIME-INR
INR: 1.82 — ABNORMAL HIGH (ref 0.00–1.49)
Prothrombin Time: 20.5 seconds — ABNORMAL HIGH (ref 11.6–15.2)

## 2013-03-26 MED ORDER — WARFARIN SODIUM 10 MG PO TABS
10.0000 mg | ORAL_TABLET | Freq: Once | ORAL | Status: AC
  Administered 2013-03-26: 10 mg via ORAL
  Filled 2013-03-26: qty 1

## 2013-03-26 NOTE — Progress Notes (Signed)
Patient changed to inpatient- requires vent at night.

## 2013-03-26 NOTE — Progress Notes (Signed)
PULMONARY / CRITICAL CARE MEDICINE  Name: Travis Chapman MRN: [id number] DOB: 11/06/1945    ADMISSION DATE:  03/24/2013 CONSULTATION DATE:  03/24/2013  REFERRING MD :  EDP PRIMARY SERVICE:  TRH  CHIEF COMPLAINT:  BLE weakness  BRIEF PATIENT DESCRIPTION: 68 year old male, trach 2006 requiring nocturnal vent. Presents 3/9 with BLE weakness and atypical chest pain x 2 weeks. Office pt of Dr. Lamonte Sakai.    SIGNIFICANT EVENTS / STUDIES:  3/9  Admitted to [place] / TUBES: Trach 2006 >>>  CULTURES: Sputum 3/9 >>>  ANTIBIOTICS:  INTERVAL HISTORY:  No overnight events.  VITAL SIGNS: Temp:  [98.5 F (36.9 C)-99.3 F (37.4 C)] 99.3 F (37.4 C) (03/11 1239) Pulse Rate:  [66-74] 66 (03/11 1239) Resp:  [16-26] 18 (03/11 1239) BP: (109-156)/(53-93) 156/83 mmHg (03/11 1239) SpO2:  [96 %-99 %] 97 % (03/11 1239) FiO2 (%):  [28 %-30 %] 28 % (03/11 1239)  PHYSICAL EXAMINATION: General:  Resting in the chair Neuro:  Alert, oriented HEENT:  Moist membranes Neck:  Minimal secretions via trach Cardiovascular:  Regular, distant heart sounds Lungs: Few rhonchi Abdomen:  Obese, soft Musculoskeletal:  Trace edema Skin:  Intact, No rash  LABS:  Recent Labs Lab 03/24/13 0339 03/25/13 0250  NA 142 141  K 4.4 4.4  CL 103 103  CO2 29 31  BUN 20 16  CREATININE 1.59* 1.39*  GLUCOSE 110* 93    Recent Labs Lab 03/24/13 0339 03/25/13 0250  HGB 11.6* 11.1*  HCT 36.2* 34.3*  WBC 5.6 5.2  PLT 147* 157   IMAGING:  No results found.  ASSESSMENT / PLAN:  OSA / OHS Tracheostomy status Chronic respiratory failure on nocturnal ventilator  -->  Goal SpO2>92 -->  ATC during daytime hours -->  Nocturnal ventilator support -->  Albuterol PRN  I have personally obtained history, examined patient, evaluated and interpreted laboratory and imaging results, reviewed medical records, formulated assessment / plan and placed orders.  Doree Fudge, MD Pulmonary and [place] Pager: [phone]  03/26/2013, 1:15 PM

## 2013-03-26 NOTE — Progress Notes (Signed)
ANTICOAGULATION CONSULT NOTE  Pharmacy Consult for Coumadin Indication: pulmonary embolus  No Known Allergies  Patient Measurements: Height: 6\' 2"  (188 cm) Weight: 374 lb (169.645 kg) IBW/kg (Calculated) : 82.2  Vital Signs: Temp: 99 F (37.2 C) (03/11 0419) Temp src: Oral (03/11 0419) BP: 132/67 mmHg (03/11 0419) Pulse Rate: 69 (03/11 0419)  Labs:  Recent Labs  03/24/13 0339 03/24/13 0945 03/24/13 1411 03/24/13 2019 03/25/13 0250 03/26/13 0453  HGB 11.6*  --   --   --  11.1*  --   HCT 36.2*  --   --   --  34.3*  --   PLT 147*  --   --   --  157  --   LABPROT  --  24.9*  --   --  20.4* 20.5*  INR  --  2.34*  --   --  1.80* 1.82*  CREATININE 1.59*  --   --   --  1.39*  --   TROPONINI  --  <0.30 <0.30 <0.30  --   --     Estimated Creatinine Clearance: 85.5 ml/min (by C-G formula based on Cr of 1.39).  Medications:  Coumadin 10mg  MWF, 5mg  TTSS  Assessment:  68 y/o M with chronic respiratory failure on TC (2006) presented with weakness in LE and knee pain resulting in fall 3/8. He has chronic deconditioning and previously refused NH placement. Patient is on chronic coumadin for hx PE noted 03/16/10. INR remains slightly low at 1.82. It appears pt was not given his ordered dose on 3/9 for unclear reasons. So INR is low related to skipped dose.   Goal of Therapy:  INR 2-3   Plan:  1. Repeat coumadin 10mg  PO x 1 tonight 2. F/u AM INR  Salome Arnt, PharmD, BCPS Pager # [phone] 03/26/2013 8:51 AM

## 2013-03-26 NOTE — Progress Notes (Signed)
Patient placed on mechanical ventilation for nocturnal rest. Patient tolerating well at this time. RN aware.

## 2013-03-26 NOTE — Care Management Note (Addendum)
    Page 1 of 1   03/28/2013     12:32:54 PM   CARE MANAGEMENT NOTE 03/28/2013  Patient:  Travis Chapman, Travis Chapman   Account Number:  [id number]  Date Initiated:  03/25/2013  Documentation initiated by:  Bethanny Toelle  Subjective/Objective Assessment:   dx atypical chest pain; chronic trach and nocturnal vent through [place]. Lives with mother- brother and dtr assist as needed     In-house referral  Clinical Social Worker      DC Forensic scientist  CM consult      Choice offered to / List presented to:  C-1 Patient   [place] arranged  HH-1 RN  [place].   Per UR Regulation:  Reviewed for med. necessity/level of care/duration of stay  Comments:  Contact:  Wylie Hail #[phone]  03/28/13 [place] RN MSN BSN CCM Per CSW, pt will probably need to transfer to vent SNF in [place].  Discussed with pt and he states he would prefer to go home with home health RN, PT, and OT. Provided list of agencies, referral made per choice.  Dtr is out of town until 3/15 but he will ask brother to assist as needed.  PT to re-eval. 1216  Per PT, pt has improved, will need bariatric RW, states his is "rickety."  [facility] liaison will talk with pt about cost as pt received walker just one year ago.  03/26/13 [place] MSN BSN CCM PT recommending SNF; OT recommends CIR but they will not take due to nocturnal vent, recommend SNF for rehab.  CSW notified.

## 2013-03-26 NOTE — Progress Notes (Addendum)
[facility] Progress Note [place] TEAM 1 - Stepdown/ICU TEAM   Travis Chapman CZY:[id number] DOB: 10-Jan-1946 DOA: 03/24/2013 PCP: Travis Shin, MD  Brief narrative: Travis Chapman is a 68 y.o. 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    Subjective: No complaints. Per RN who suctioned him, not much mucous.   Assessment/Plan: Principal Problem:   Chest pain - atypical - cardiac enzymes negative. Likely muscles spasm. No further complaints of it.   Active Problems: Fever - improving- if resolution does not occur, then will check for PE/ DVT-  - acute bronchitis? Per pt his is not coughing. UA negative. No GI symptoms. Influenza negative and no leukocytosis.  - Rt knee xray w/o effusion.    CARDIOMYOPATHY--  Diastolic HF (heart failure) - stable    KNEE PAIN - likely Osteoarthritis - currently no pain    Lower extremity weakness---  Generalized weakness - admitted for the same in the fall - Per PT eval will need SNF - I have advised him to lose weight    Chronic respiratory failure/ OSA OHS--  Ventilator dependence--Tracheostomy status - stable  DM - A1c7.3  - sugars stable  Morbid obesity - advised to lose weight- does not appear to be fluid weight  CKD 3 - stable  HTN - stable  H/o PE - cont coumadin- INR low  Code Status: full code Family Communication: none Disposition Plan:  CIR  Consultants: PCCM  Procedures: none  Antibiotics: Antibiotics Given (last 72 hours)   None       DVT prophylaxis: Coumadin for h/o PE  Objective: Filed Weights   03/24/13 0328 03/24/13 1900  Weight: 183.707 kg (405 lb) 169.645 kg (374 lb)   Blood pressure 156/83, pulse 66, temperature 99.3 F (37.4 C), temperature source Oral, resp. rate 18, height 6\' 2"  (1.88 m), weight 169.645 kg (374 lb), SpO2 97.00%.  Intake/Output Summary (Last 24 hours) at 03/26/13 1349 Last data filed at 03/26/13 0600  Gross per 24 hour  Intake    220 ml  Output   1150 ml  Net   -930 ml     Exam: General: No acute respiratory distress- morbidly obese- sitting up in chair Lungs: Clear to auscultation bilaterally without wheezes or crackles- Trach intact Cardiovascular: Regular rate and rhythm without murmur gallop or rub normal S1 and S2 Abdomen: Nontender, nondistended, soft, bowel sounds positive, no rebound, no ascites, no appreciable mass Extremities: No significant cyanosis, clubbing, or edema bilateral lower extremities  Data Reviewed: Basic Metabolic Panel:  Recent Labs Lab 03/24/13 0339 03/25/13 0250  NA 142 141  K 4.4 4.4  CL 103 103  CO2 29 31  GLUCOSE 110* 93  BUN 20 16  CREATININE 1.59* 1.39*  CALCIUM 8.7 8.5   Liver Function Tests:  Recent Labs Lab 03/24/13 0339  AST 16  ALT 12  ALKPHOS 74  BILITOT 0.2*  PROT 6.9  ALBUMIN 3.0*   No results found for this basename: LIPASE, AMYLASE,  in the last 168 hours No results found for this basename: AMMONIA,  in the last 168 hours CBC:  Recent Labs Lab 03/24/13 0339 03/25/13 0250  WBC 5.6 5.2  HGB 11.6* 11.1*  HCT 36.2* 34.3*  MCV 90.5 90.5  PLT 147* 157   Cardiac Enzymes:  Recent Labs Lab 03/24/13 0945 03/24/13 1411 03/24/13 2019  TROPONINI <0.30 <0.30 <0.30   BNP (last 3 results)  Recent Labs  11/24/12 1250 11/26/12 0600 03/10/13 1630  PROBNP 264.4* 596.6* 58.0   CBG:  Recent  Labs Lab 03/25/13 1245 03/25/13 1631 03/25/13 2138 03/26/13 0725 03/26/13 1207  GLUCAP 143* 130* 152* 121* 130*    Recent Results (from the past 240 hour(s))  CULTURE, RESPIRATORY (NON-EXPECTORATED)     Status: None   Collection Time    03/24/13  9:17 AM      Result Value Ref Range Status   Specimen Description TRACHEAL ASPIRATE   Final   Special Requests Normal   Final   Gram Stain     Final   Value: ABUNDANT WBC PRESENT, PREDOMINANTLY PMN     RARE SQUAMOUS EPITHELIAL CELLS PRESENT     ABUNDANT GRAM NEGATIVE COCCI     ABUNDANT GRAM NEGATIVE RODS     ABUNDANT GRAM POSITIVE RODS   Culture     Final   Value: Culture reincubated for better growth     Performed at [facility]   Report Status PENDING   Incomplete  MRSA PCR SCREENING     Status: None   Collection Time    03/24/13  7:09 PM      Result Value Ref Range Status   MRSA by PCR NEGATIVE  NEGATIVE Final   Comment:            The GeneXpert MRSA Assay (FDA     approved for NASAL specimens     only), is one component of a     comprehensive MRSA colonization     surveillance program. It is not     intended to diagnose MRSA     infection nor to guide or     monitor treatment for     MRSA infections.     Studies:  Recent x-ray studies have been reviewed in detail by the Attending Physician  Scheduled Meds:  Scheduled Meds: . amLODipine  10 mg Oral Daily  . aspirin  81 mg Oral Daily  . carvedilol  6.25 mg Oral BID WC  . citalopram  40 mg Oral Daily  . cloNIDine  0.1 mg Oral BID  . guaiFENesin  600 mg Oral BID  . insulin aspart  0-9 Units Subcutaneous TID WC  . insulin glargine  25 Units Subcutaneous q morning - 10a  . lactulose  20 g Oral BID  . metoCLOPramide  5 mg Oral TID AC & HS  . multivitamin with minerals  1 tablet Oral Daily  . simvastatin  20 mg Oral Daily  . sodium chloride  3 mL Intravenous Q12H  . warfarin  10 mg Oral ONCE-1800  . Warfarin - Pharmacist Dosing Inpatient   Does not apply  q1800   Continuous Infusions:   Time spent on care of this patient: >35 min   Debbe Odea, MD  [facility] Office  [phone] Pager - Text Page per Shea Evans as per below:  On-Call/Text Page:      [email or web address]  If 7PM-7AM, please contact night-coverage [email or web address] 03/26/2013, 1:49 PM   LOS: 2 days

## 2013-03-26 NOTE — Progress Notes (Signed)
Physical Therapy Treatment Patient Details Name: Travis Chapman MRN: [id number] DOB: November 05, 1945 Today's Date: 03/26/2013 Time: 6948-5462 PT Time Calculation (min): 25 min  PT Assessment / Plan / Recommendation  History of Present Illness Pt admit with bil LE weakness, CP and PE.  Long term trach since 2006.     PT Comments   Pt admitted with above. Pt currently with functional limitations due to balance and endurance deficits.  Short term SNF would be beneficial since pt is discharging home with 67 year old mom.  Pt will benefit from skilled PT to increase their independence and safety with mobility to allow discharge to the venue listed below.    Follow Up Recommendations  SNF;Supervision/Assistance - 24 hour     Does the patient have the potential to tolerate intense rehabilitation     Barriers to Discharge        Equipment Recommendations  Rolling walker with 5" wheels;3in1 (PT) (Pt needs bariatric RW  and 3N1 for > 374 pounds.  )    Recommendations for Other Services    Frequency Min 3X/week   Progress towards PT Goals    Plan Current plan remains appropriate    Precautions / Restrictions Precautions Precautions: Fall Precaution Comments: trach (at 28%) Restrictions Weight Bearing Restrictions: No   Pertinent Vitals/Pain VSS, No pain.    Mobility  Transfers Overall transfer level: Needs assistance Equipment used: Rolling walker (2 wheeled) Transfers: Sit to/from Stand Sit to Stand: +2 physical assistance;Max assist General transfer comment: Needed +2 max assist to rise from low recliner.  Contifnues to have difficulty getting left LE under him as it is externally rotated and abducted.  Pt needed cues for hand placement as well.  Pt continues with wide BOS.  Needed stezdying assist to get balance once upright.  Needed assist to control descent into chair as well.   Ambulation/Gait Ambulation/Gait assistance: +2 safety/equipment;Min assist;Mod assist Ambulation Distance  (Feet): 25 Feet Assistive device: Rolling walker (2 wheeled) Gait Pattern/deviations: Step-to pattern;Decreased stride length;Decreased step length - right;Decreased step length - left;Wide base of support;Trunk flexed Gait velocity interpretation: Below normal speed for age/gender General Gait Details: Pt needed cues for sequencing steps and RW.  Cues for postural stability.      Exercises General Exercises - Lower Extremity Ankle Circles/Pumps: AROM;Both;10 reps;Seated Long Arc Quad: AROM;Both;10 reps;Seated Hip Flexion/Marching: AROM;Both;10 reps;Seated Other Exercises Other Exercises: encouraged pt to complete B U/LE AROM   PT Diagnosis:    PT Problem List:   PT Treatment Interventions:     PT Goals (current goals can now be found in the care plan section)    Visit Information  Last PT Received On: 03/26/13 Assistance Needed: +2 History of Present Illness: Pt admit with bil LE weakness, CP and PE.  Long term trach since 2006.      Subjective Data  Subjective: "I am tired but want to try."   Cognition  Cognition Arousal/Alertness: Awake/alert Behavior During Therapy: WFL for tasks assessed/performed Overall Cognitive Status: Within Functional Limits for tasks assessed    Balance  Balance Standing balance support: Bilateral upper extremity supported;During functional activity Standing balance-Leahy Scale: Poor Standing balance comment: Continues to rely heavily on UEs and RW for support.   End of Session PT - End of Session Equipment Utilized During Treatment: Gait belt;Oxygen (trach) Activity Tolerance: Patient limited by fatigue Patient left: in chair;with call bell/phone within reach Nurse Communication: Mobility status   Travis Chapman,Travis Chapman 03/26/2013, 11:37 AM Travis Chapman  Acute Rehabilitation [phone] (pager)

## 2013-03-26 NOTE — Progress Notes (Signed)
Placed patient on mechanical ventilation for nocturnal use.  RN aware.  Tolerating well.

## 2013-03-27 DIAGNOSIS — E860 Dehydration: Secondary | ICD-10-CM

## 2013-03-27 DIAGNOSIS — I5030 Unspecified diastolic (congestive) heart failure: Secondary | ICD-10-CM

## 2013-03-27 LAB — PROTIME-INR
INR: 2 — ABNORMAL HIGH (ref 0.00–1.49)
PROTHROMBIN TIME: 22.1 s — AB (ref 11.6–15.2)

## 2013-03-27 LAB — CULTURE, RESPIRATORY

## 2013-03-27 LAB — GLUCOSE, CAPILLARY
GLUCOSE-CAPILLARY: 186 mg/dL — AB (ref 70–99)
Glucose-Capillary: 118 mg/dL — ABNORMAL HIGH (ref 70–99)
Glucose-Capillary: 176 mg/dL — ABNORMAL HIGH (ref 70–99)
Glucose-Capillary: 140 mg/dL — ABNORMAL HIGH (ref 70–99)
Glucose-Capillary: 180 mg/dL — ABNORMAL HIGH (ref 70–99)

## 2013-03-27 LAB — CULTURE, RESPIRATORY W GRAM STAIN: Special Requests: NORMAL

## 2013-03-27 MED ORDER — WARFARIN SODIUM 10 MG PO TABS
10.0000 mg | ORAL_TABLET | Freq: Once | ORAL | Status: AC
  Administered 2013-03-27: 10 mg via ORAL
  Filled 2013-03-27: qty 1

## 2013-03-27 MED ORDER — CITALOPRAM HYDROBROMIDE 20 MG PO TABS
20.0000 mg | ORAL_TABLET | Freq: Every day | ORAL | Status: DC
  Administered 2013-03-28 – 2013-03-29 (×2): 20 mg via ORAL
  Filled 2013-03-27 (×2): qty 1

## 2013-03-27 NOTE — Progress Notes (Addendum)
Placed patient on 28%  ATC. Cuff deflated.  No distress noted. Vent at bedside on stand-by. RN aware.

## 2013-03-27 NOTE — Progress Notes (Signed)
ANTICOAGULATION CONSULT NOTE  Pharmacy Consult for Coumadin Indication: pulmonary embolus  No Known Allergies  Patient Measurements: Height: 6\' 2"  (188 cm) Weight: 374 lb (169.645 kg) IBW/kg (Calculated) : 82.2  Vital Signs: Temp: 98.4 F (36.9 C) (03/12 0717) Temp src: Oral (03/12 0717) BP: 126/72 mmHg (03/12 0717) Pulse Rate: 95 (03/12 0717)  Labs:  Recent Labs  03/24/13 0945 03/24/13 1411 03/24/13 2019 03/25/13 0250 03/26/13 0453 03/27/13 0407  HGB  --   --   --  11.1*  --   --   HCT  --   --   --  34.3*  --   --   PLT  --   --   --  157  --   --   LABPROT 24.9*  --   --  20.4* 20.5* 22.1*  INR 2.34*  --   --  1.80* 1.82* 2.00*  CREATININE  --   --   --  1.39*  --   --   TROPONINI <0.30 <0.30 <0.30  --   --   --     Estimated Creatinine Clearance: 85.5 ml/min (by C-G formula based on Cr of 1.39).  Medications:  Coumadin 10mg  MWF, 5mg  TTSS  Assessment:  68 y/o M with chronic respiratory failure on TC (2006) presented with weakness in LE and knee pain resulting in fall 3/8. He has chronic deconditioning and previously refused NH placement. Patient is on chronic coumadin for hx PE noted 03/16/10. INR is now at goal at 2. It appears pt was not given his ordered dose on 3/9 for unclear reasons. So INR was low likely related to skipped dose.   Goal of Therapy:  INR 2-3   Plan:  1. Repeat coumadin 10mg  PO x 1 tonight  2. F/u AM INR  Salome Arnt, PharmD, BCPS Pager # [phone] 03/27/2013 8:14 AM

## 2013-03-27 NOTE — Progress Notes (Signed)
[facility] Progress Note [place] TEAM 1 - Stepdown/ICU TEAM   Travis Chapman [id number] DOB: 01-23-1945 DOA: 03/24/2013 PCP: Renato Shin, MD  Brief narrative: Travis Chapman is a 68 y.o. 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    Subjective: No complaints today- feeling stronger  Assessment/Plan: Principal Problem:   Chest pain - atypical - cardiac enzymes negative. Likely muscles spasm. No further complaints of it.   Active Problems: Fever - improving- if resolution does not occur, then will check for PE/ DVT-  - acute bronchitis? Per pt his is not coughing. UA negative. No GI symptoms. Influenza negative and no leukocytosis.  - Rt knee xray w/o effusion.    CARDIOMYOPATHY--  Diastolic HF (heart failure) - stable    KNEE PAIN - likely Osteoarthritis - currently no pain    Lower extremity weakness---  Generalized weakness - admitted for the same in the fall - Per PT eval will need SNF - I have advised him to lose weight    Chronic respiratory failure/ OSA OHS--  Ventilator dependence--Tracheostomy status - stable  DM - A1c7.3  - sugars stable  Morbid obesity - advised to lose weight- does not appear to be fluid weight  CKD 3 - stable  HTN - stable  H/o PE - cont coumadin-   Code Status: full code Family Communication: none Disposition Plan: SNF  Consultants: PCCM  Procedures: none  Antibiotics: Antibiotics Given (last 72 hours)    None       DVT prophylaxis: Coumadin for h/o PE  Objective: Filed Weights   03/24/13 0328 03/24/13 1900  Weight: 183.707 kg (405 lb) 169.645 kg (374 lb)   Blood pressure 152/74, pulse 95, temperature 98.2 F (36.8 C), temperature source Oral, resp. rate 22, height 6\' 2"  (1.88 m), weight 169.645 kg (374 lb), SpO2 99.00%.  Intake/Output Summary (Last 24 hours) at 03/27/13 1336 Last data filed at 03/27/13 0850  Gross per 24 hour  Intake      6 ml  Output   2735 ml  Net  -2729 ml     Exam: General: No acute respiratory distress- morbidly obese- sitting up in chair Lungs: Clear to auscultation bilaterally without wheezes or crackles- Trach intact Cardiovascular: Regular rate and rhythm without murmur gallop or rub normal S1 and S2 Abdomen: Nontender, nondistended, soft, bowel sounds positive, no rebound, no ascites, no appreciable mass Extremities: No significant cyanosis, clubbing, or edema bilateral lower extremities  Data Reviewed: Basic Metabolic Panel:  Recent Labs Lab 03/24/13 0339 03/25/13 0250  NA 142 141  K 4.4 4.4  CL 103 103  CO2 29 31  GLUCOSE 110* 93  BUN 20 16  CREATININE 1.59* 1.39*  CALCIUM 8.7 8.5   Liver Function Tests:  Recent Labs Lab 03/24/13 0339  AST 16  ALT 12  ALKPHOS 74  BILITOT 0.2*  PROT 6.9  ALBUMIN 3.0*   No results found for this basename: LIPASE, AMYLASE,  in the last 168 hours No results found for this basename: AMMONIA,  in the last 168 hours  CBC:  Recent Labs Lab 03/24/13 0339 03/25/13 0250  WBC 5.6 5.2  HGB 11.6* 11.1*  HCT 36.2* 34.3*  MCV 90.5 90.5  PLT 147* 157   Cardiac Enzymes:  Recent Labs Lab 03/24/13 0945 03/24/13 1411 03/24/13 2019  TROPONINI <0.30 <0.30 <0.30   BNP (last 3 results)  Recent Labs  11/24/12 1250 11/26/12 0600 03/10/13 1630  PROBNP 264.4* 596.6* 58.0   CBG:  Recent Labs Lab 03/26/13 1600 03/26/13 2009 03/27/13 0717 03/27/13 1213 03/27/13 1253  GLUCAP 119* 214*  118* 176* 186*    Recent Results (from the past 240 hour(s))  CULTURE, RESPIRATORY (NON-EXPECTORATED)     Status: None   Collection Time    03/24/13  9:17 AM      Result Value Ref Range Status   Specimen Description TRACHEAL ASPIRATE   Final   Special Requests Normal   Final   Gram Stain     Final   Value: ABUNDANT WBC PRESENT, PREDOMINANTLY PMN     RARE SQUAMOUS EPITHELIAL CELLS PRESENT     ABUNDANT GRAM NEGATIVE COCCI     ABUNDANT GRAM NEGATIVE RODS     ABUNDANT GRAM POSITIVE RODS   Culture     Final   Value: MODERATE SERRATIA MARCESCENS     ABUNDANT HAEMOPHILUS INFLUENZAE     Note: BETA LACTAMASE NEGATIVE     ABUNDANT MORAXELLA CATARRHALIS(BRANHAMELLA)     Note: BETA LACTAMASE POSITIVE   Report Status 03/27/2013 FINAL   Final   Organism ID, Bacteria SERRATIA MARCESCENS   Final  MRSA PCR SCREENING     Status: None   Collection Time    03/24/13  7:09 PM      Result Value Ref Range Status   MRSA by PCR NEGATIVE  NEGATIVE Final   Comment:            The GeneXpert MRSA Assay (FDA     approved for NASAL specimens     only), is one component of a     comprehensive MRSA colonization     surveillance program. It is not     intended to diagnose MRSA     infection nor to guide or     monitor treatment for     MRSA infections.     Studies:  Recent x-ray studies have been reviewed in detail by the Attending Physician  Scheduled Meds:  Scheduled Meds: . amLODipine  10 mg Oral Daily  . aspirin  81 mg Oral Daily  . carvedilol  6.25 mg Oral BID WC  . citalopram  40 mg Oral Daily  . cloNIDine  0.1 mg Oral BID  . guaiFENesin  600 mg Oral BID  . insulin aspart  0-9 Units Subcutaneous TID WC  . insulin glargine  25 Units Subcutaneous q morning - 10a  . lactulose  20 g Oral BID  . metoCLOPramide  5 mg Oral TID AC & HS  . multivitamin with minerals  1 tablet Oral Daily  . simvastatin  20 mg Oral Daily  . sodium chloride  3 mL Intravenous Q12H  . warfarin  10 mg Oral ONCE-1800   . Warfarin - Pharmacist Dosing Inpatient   Does not apply q1800   Continuous Infusions:   Time spent on care of this patient: >35 min   Debbe Odea, MD  [facility] Office  [phone] Pager - Text Page per Shea Evans as per below:  On-Call/Text Page:      [email or web address]  If 7PM-7AM, please contact night-coverage [email or web address]  03/27/2013, 1:36 PM   LOS: 3 days

## 2013-03-27 NOTE — Progress Notes (Signed)
PULMONARY / CRITICAL CARE MEDICINE  Name: Travis Chapman MRN: [id number] DOB: [birth year]    ADMISSION DATE:  03/24/2013 CONSULTATION DATE:  03/24/2013  REFERRING MD :  EDP PRIMARY SERVICE:  TRH  CHIEF COMPLAINT:  BLE weakness  BRIEF PATIENT DESCRIPTION: 68 year old male, trach 2006 requiring nocturnal vent. Presents 3/9 with BLE weakness and atypical chest pain x 2 weeks. Office pt of Dr. Lamonte Sakai.    SIGNIFICANT EVENTS / STUDIES:  3/9  Admitted to [facility] / TUBES: Trach 2006 >>>  CULTURES: Sputum 3/9 >>>  ANTIBIOTICS:  INTERVAL HISTORY:  No overnight events.  VITAL SIGNS: Temp:  [98 F (36.7 C)-99.3 F (37.4 C)] 98.4 F (36.9 C) (03/12 0717) Pulse Rate:  [60-95] 95 (03/12 0717) Resp:  [12-22] 22 (03/12 0717) BP: (126-156)/(72-97) 126/72 mmHg (03/12 0856) SpO2:  [96 %-100 %] 99 % (03/12 0856) FiO2 (%):  [28 %-30 %] 28 % (03/12 0856)  PHYSICAL EXAMINATION: General:  No distress, no WOB increase Neuro:  Sleepy, wakes up to voice HEENT:  Moist membranes Neck:  Minimal secretions via trach Cardiovascular:  Distant heart sounds Lungs: Scattered rhonchi Abdomen:  Obese, soft Musculoskeletal:  Trace edema Skin:  Intact, No rash  LABS:  Recent Labs Lab 03/24/13 0339 03/25/13 0250  NA 142 141  K 4.4 4.4  CL 103 103  CO2 29 31  BUN 20 16  CREATININE 1.59* 1.39*  GLUCOSE 110* 93    Recent Labs Lab 03/24/13 0339 03/25/13 0250  HGB 11.6* 11.1*  HCT 36.2* 34.3*  WBC 5.6 5.2  PLT 147* 157   IMAGING:  No results found.  ASSESSMENT / PLAN:  OSA / OHS Tracheostomy status Chronic respiratory failure on nocturnal ventilator  -->  Goal SpO2>92 -->  ATC during daytime hours -->  Nocturnal ventilator support -->  Needs chronic vent facility placement -->  Albuterol PRN -->  Will follow  I have personally obtained history, examined patient, evaluated and interpreted laboratory and imaging results, reviewed medical records, formulated assessment / plan and  placed orders.  Doree Fudge, MD Pulmonary and [place] Pager: [phone]  03/27/2013, 11:32 AM

## 2013-03-27 NOTE — Progress Notes (Signed)
Clinical Social Work Department BRIEF PSYCHOSOCIAL ASSESSMENT 03/27/2013  Patient:  Travis Chapman, Travis Chapman     Account Number:  [id number]     Admit date:  03/24/2013  Clinical Social Worker:  Adair Laundry  Date/Time:  03/27/2013 03:30 PM  Referred by:  Physician  Date Referred:  03/27/2013 Referred for  SNF Placement   Other Referral:   Interview type:  Patient Other interview type:    PSYCHOSOCIAL DATA Living Status:  FAMILY Admitted from facility:   Level of care:   Primary support name:  Kagan Mutchler [phone] Primary support relationship to patient:  CHILD, ADULT Degree of support available:   Pt reports having family support    CURRENT CONCERNS Current Concerns  Post-Acute Placement   Other Concerns:    SOCIAL WORK ASSESSMENT / PLAN CSW spoke to pt about recommendation for SNF and explained vent SNF. Pt was already aware of both and is agreeable. CSW did talk to pt about difficulty of finding placement in or around [place]. Pt informed CSW that at this time he does not know how he would manage at home and he would be willing to go elsewhere. However, pt did state he would not want to go too far. CSW explained referal process and will keep pt updated.      CSW has called multiple facilities and at this time there is no facility able to work with pt needs. SNFs able to support vent are requiring that pt either be able to be weaned off or require vent for 10+ hours. CSW working with supervisor to contact SNFs but has also informed RN CM that pt will likely have to return home with [facility] services.   Assessment/plan status:  Psychosocial Support/Ongoing Assessment of Needs Other assessment/ plan:   Information/referral to community resources:   Will provide bed offers if/when available    PATIENT'S/FAMILY'S RESPONSE TO PLAN OF CARE: Pt is agreeable to SNF       [place], [place]

## 2013-03-28 LAB — PROTIME-INR
INR: 2.18 — AB (ref 0.00–1.49)
PROTHROMBIN TIME: 23.6 s — AB (ref 11.6–15.2)

## 2013-03-28 LAB — GLUCOSE, CAPILLARY
GLUCOSE-CAPILLARY: 181 mg/dL — AB (ref 70–99)
Glucose-Capillary: 111 mg/dL — ABNORMAL HIGH (ref 70–99)
Glucose-Capillary: 149 mg/dL — ABNORMAL HIGH (ref 70–99)

## 2013-03-28 MED ORDER — WARFARIN SODIUM 10 MG PO TABS
10.0000 mg | ORAL_TABLET | Freq: Once | ORAL | Status: AC
  Administered 2013-03-28: 10 mg via ORAL
  Filled 2013-03-28: qty 1

## 2013-03-28 NOTE — Progress Notes (Signed)
[facility] Progress Note Travis Chapman TEAM 1 - Stepdown/ICU TEAM   Travis Chapman YSA:[id number] DOB: [birth year] DOA: 03/24/2013 PCP: Renato Shin, MD  Brief narrative: Travis Chapman is a 68 y.o. 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    Subjective: No complaints   Assessment/Plan: Principal Problem:   Chest pain - atypical - cardiac enzymes negative. Likely muscles spasm. No further complaints of it.   Active Problems: Fever - improved- possibly atelectasis - acute bronchitis? Per pt his is not coughing. UA negative. No GI symptoms. Influenza negative and no leukocytosis.  - Rt knee xray w/o effusion.    CARDIOMYOPATHY--  Diastolic HF (heart failure) - stable    KNEE PAIN - likely Osteoarthritis - currently no pain    Lower extremity weakness---  Generalized weakness - actually feeling stronger over last few day - admitted for the same in the fall - Per PT eval will need SNF - I have advised him to lose weight- he is asking for a dietician consult    Chronic respiratory failure/ OSA OHS--  Ventilator dependence at night--Tracheostomy status - stable  DM - A1c7.3  - sugars stable  Morbid obesity - advised to lose weight- does not appear to be fluid weight  CKD 3 - stable  HTN - stable  H/o PE - cont coumadin-   Code Status: full code Family Communication: none Disposition Plan: home with home  healthy  Consultants: PCCM  Procedures: none  Antibiotics: Antibiotics Given (last 72 hours)   None       DVT prophylaxis: Coumadin for h/o PE  Objective: Filed Weights   03/24/13 0328 03/24/13 1900  Weight: 183.707 kg (405 lb) 169.645 kg (374 lb)   Blood pressure 121/89, pulse 70, temperature 98.4 F (36.9 C), temperature source Oral, resp. rate 19, height 6\' 2"  (1.88 m), weight 169.645 kg (374 lb), SpO2 95.00%.  Intake/Output Summary (Last 24 hours) at 03/28/13 1400 Last data filed at 03/28/13 1119  Gross per 24 hour  Intake    126 ml  Output   2250 ml  Net  -2124 ml     Exam: General: No acute respiratory distress- morbidly obese- sitting up in chair Lungs: Clear to auscultation bilaterally without wheezes or crackles- Trach intact Cardiovascular: Regular rate and rhythm without murmur gallop or rub normal S1 and S2 Abdomen: Nontender, nondistended, soft, bowel sounds positive, no rebound, no ascites, no appreciable mass Extremities: No significant cyanosis, clubbing, or edema bilateral lower extremities  Data Reviewed: Basic Metabolic Panel:  Recent Labs Lab 03/24/13 0339 03/25/13 0250  NA 142 141  K 4.4 4.4  CL 103 103  CO2 29 31  GLUCOSE 110* 93  BUN 20 16  CREATININE 1.59* 1.39*  CALCIUM 8.7 8.5   Liver Function Tests:  Recent Labs Lab 03/24/13 0339  AST 16  ALT 12  ALKPHOS 74  BILITOT 0.2*  PROT 6.9  ALBUMIN 3.0*   No results found for this basename: LIPASE, AMYLASE,  in the last 168 hours No results found for this basename: AMMONIA,  in the last 168 hours CBC:  Recent Labs Lab 03/24/13 0339 03/25/13 0250  WBC 5.6 5.2  HGB 11.6* 11.1*  HCT 36.2* 34.3*  MCV 90.5 90.5  PLT 147* 157   Cardiac Enzymes:  Recent Labs Lab 03/24/13 0945 03/24/13 1411 03/24/13 2019  TROPONINI <0.30 <0.30 <0.30   BNP (last 3 results)  Recent Labs  11/24/12 1250 11/26/12 0600 03/10/13 1630  PROBNP 264.4* 596.6* 58.0   CBG:  Recent  Labs Lab 03/27/13 1213 03/27/13 1253 03/27/13 1715 03/27/13 2018 03/28/13 0803  GLUCAP 176* 186* 140* 180* 111*    Recent Results (from the past 240 hour(s))  CULTURE, RESPIRATORY (NON-EXPECTORATED)     Status: None   Collection Time    03/24/13  9:17 AM      Result Value Ref Range Status   Specimen Description TRACHEAL ASPIRATE   Final   Special Requests Normal   Final   Gram Stain     Final   Value: ABUNDANT WBC PRESENT, PREDOMINANTLY PMN     RARE SQUAMOUS EPITHELIAL CELLS PRESENT     ABUNDANT GRAM NEGATIVE COCCI     ABUNDANT GRAM NEGATIVE RODS     ABUNDANT GRAM POSITIVE RODS   Culture     Final   Value: MODERATE SERRATIA MARCESCENS     ABUNDANT HAEMOPHILUS INFLUENZAE     Note: BETA LACTAMASE NEGATIVE     ABUNDANT MORAXELLA CATARRHALIS(BRANHAMELLA)     Note: BETA LACTAMASE POSITIVE   Report Status 03/27/2013 FINAL   Final   Organism ID, Bacteria SERRATIA MARCESCENS   Final  MRSA PCR SCREENING     Status: None   Collection Time    03/24/13  7:09 PM      Result Value Ref Range Status   MRSA by PCR NEGATIVE  NEGATIVE Final   Comment:            The GeneXpert MRSA Assay (FDA     approved for NASAL specimens     only), is one component of a     comprehensive MRSA colonization     surveillance program. It is not     intended to diagnose MRSA     infection nor to guide or     monitor treatment for     MRSA infections.     Studies:  Recent x-ray studies have been reviewed in detail by the Attending Physician  Scheduled Meds:  Scheduled Meds: . amLODipine  10 mg Oral Daily  . aspirin  81 mg Oral Daily  . carvedilol  6.25 mg Oral BID WC  . citalopram  20 mg Oral Daily  . cloNIDine  0.1 mg Oral BID  . guaiFENesin  600 mg Oral BID  . insulin aspart  0-9 Units Subcutaneous TID WC  . insulin glargine  25 Units Subcutaneous q morning - 10a  . lactulose  20 g Oral BID  . metoCLOPramide  5 mg Oral TID AC & HS  . multivitamin with minerals  1 tablet Oral Daily  .  simvastatin  20 mg Oral Daily  . sodium chloride  3 mL Intravenous Q12H  . warfarin  10 mg Oral ONCE-1800  . Warfarin - Pharmacist Dosing Inpatient   Does not apply q1800   Continuous Infusions:   Time spent on care of this patient: 25 min   Debbe Odea, MD  [facility] Office  [phone] Pager - Text Page per Shea Evans as per below:  On-Call/Text Page:      [email or web address]  If  7PM-7AM, please contact night-coverage [email or web address] 03/28/2013, 2:00 PM   LOS: 4 days

## 2013-03-28 NOTE — Progress Notes (Addendum)
Clinical Social Work Department CLINICAL SOCIAL WORK PLACEMENT NOTE 03/28/2013  Patient:  Travis Chapman, Travis Chapman  Account Number:  [id number] [place] date:  03/24/2013  Clinical Social Worker:  Berton Mount, Latanya Presser  Date/time:  03/27/2013 05:00 PM  Clinical Social Work is seeking post-discharge placement for this patient at the following level of care:   [place]   (*CSW will update this form in Epic as items are completed)   03/27/2013  Patient/family provided with [place] Department of Clinical Social Work's list of facilities offering this level of care within the geographic area requested by the patient (or if unable, by the patient's family).  03/27/2013  Patient/family informed of their freedom to choose among providers that offer the needed level of care, that participate in Medicare, Medicaid or managed care program needed by the patient, have an available bed and are willing to accept the patient.  03/27/2013  Patient/family informed of [facility]' ownership interest in [facility], as well as of the fact that they are under no obligation to receive care at this facility.  PASARR submitted to EDS on Existing PASARR number received from EDS on   FL2 transmitted to all facilities in geographic area requested by pt/family on  03/27/2013 FL2 transmitted to all facilities within larger geographic area on 03/27/2013  Patient informed that his/her managed care company has contracts with or will negotiate with  certain facilities, including the following:     Patient/family informed of bed offers received:  -- (None as of yet-pt informed on 03/13) Patient chooses bed at -- Physician recommends and patient chooses bed at    Patient to be transferred to [place] with [facility] on   Patient to be transferred to facility by   The following physician request were entered in Epic:   Additional Comments:  [place], [place]

## 2013-03-28 NOTE — Progress Notes (Signed)
Physical Therapy Treatment Patient Details Name: Travis Chapman MRN: [id number] DOB: 02-06-1945 Today's Date: 03/28/2013 Time: 0258-5277 PT Time Calculation (min): 14 min  PT Assessment / Plan / Recommendation  History of Present Illness Pt admit with bil LE weakness, CP and PE.  Long term trach since 2006.     PT Comments   Pt admitted with above. Pt currently with functional limitations due to balance and endurance deficits. Pt has improved significantly and wants to go home with HHPT and [place].  Pt demonstrates good safety awareness therefore feel that pt can go home with mom providing 24 hour supervision.  Will need new bariatric RW as well.  Informed [place] with CM.  Pt uses trach at night only at home per pt.  Ambulated pt on RA with pt >90% with ambulation with brief desat after seated to 88% but came back up to >90% quickly.  He states he uses the O2 as needed at home and can manage that.   Pt will benefit from skilled PT to increase their independence and safety with mobility to allow discharge to the venue listed below.   Follow Up Recommendations  Home health PT;Supervision/Assistance - 24 hour (HHOT)     Does the patient have the potential to tolerate intense rehabilitation     Barriers to Discharge        Equipment Recommendations  Rolling walker with 5" wheels (Pt needs bariatric RW   for > 374 pounds.  )    Recommendations for Other Services    Frequency Min 3X/week   Progress towards PT Goals Progress towards PT goals: Progressing toward goals  Plan Discharge plan needs to be updated    Precautions / Restrictions Precautions Precautions: Fall Precaution Comments: trach Restrictions Weight Bearing Restrictions: No   Pertinent Vitals/Pain Ambulated pt on RA with pt >90% with ambulation with brief desat after seated to 88% but came back up to >90% quickly. Other VSS.  No pain    Mobility  Transfers Overall transfer level: Independent Equipment used: Rolling walker  (2 wheeled) Transfers: Sit to/from Stand Sit to Stand: Supervision General transfer comment: Took pt incr time to get up from recliner but pt was able to place feet appropriately and push up from chair to standing position and steady himself once up.  Pt states he does not sit on that low of a surface at home.     Ambulation/Gait Ambulation/Gait assistance: Modified independent (Device/Increase time) Ambulation Distance (Feet): 75 Feet Assistive device: Rolling walker (2 wheeled) Gait Pattern/deviations: Decreased stride length;Step-through pattern;Decreased step length - right;Decreased step length - left;Wide base of support Gait velocity interpretation: Below normal speed for age/gender General Gait Details: No cues needed for steps.  Occasional cues to stand tall.  Pt very safe with RW.  Pt very cautious with mobility.      Exercises General Exercises - Lower Extremity Ankle Circles/Pumps: AROM;Both;10 reps;Seated Long Arc Quad: AROM;Both;10 reps;Seated   PT Diagnosis:    PT Problem List:   PT Treatment Interventions:     PT Goals (current goals can now be found in the care plan section)    Visit Information  Last PT Received On: 03/28/13 Assistance Needed: +1 History of Present Illness: Pt admit with bil LE weakness, CP and PE.  Long term trach since 2006.      Subjective Data  Subjective: "I want to go home."   Cognition  Cognition Arousal/Alertness: Awake/alert Behavior During Therapy: WFL for tasks assessed/performed Overall Cognitive Status: Within Functional  Limits for tasks assessed    Balance  Balance Standing balance support: Bilateral upper extremity supported;During functional activity Standing balance-Leahy Scale: Poor Standing balance comment: Relies on RW for support and balance.   High level balance activites: Turns;Sudden stops;Head turns High Level Balance Comments: Can perform above challenges with RW without LOb.    End of Session PT - End of  Session Equipment Utilized During Treatment: Gait belt;Oxygen (trach) Activity Tolerance: Patient tolerated treatment well Patient left: in chair;with call bell/phone within reach Nurse Communication: Mobility status   GP     INGOLD,Laverle Pillard 03/28/2013, 11:44 AM [facility] [phone] (pager)

## 2013-03-28 NOTE — Progress Notes (Signed)
SATURATION QUALIFICATIONS: (This note is used to comply with regulatory documentation for home oxygen)  Patient Saturations on Room Air at Rest = 94%  Patient Saturations on Room Air while Ambulating = 92%  Patient Saturations on 2 Liters of oxygen while Ambulating = 96%  Please briefly explain why patient needs home oxygen:Pt did not need O2 with ambulation.  However upon return to room pt desat to 88% for a few seconds and then return to >90%.  Pt reports he desats at night and wears the O2 on trach at night at home.  Thanks. [facility] [phone] (pager)

## 2013-03-28 NOTE — Progress Notes (Signed)
  RD consulted for nutrition education regarding weight loss.  Body mass index is 48 kg/(m^2). Pt meets criteria for Morbid Obesity based on current BMI.  RD provided "Weight Loss Tips" handout from the Academy of [employer] and [employer]etics as well as "5-day 1800-Calorie Sample Menus". Emphasized the importance of serving sizes and provided examples of correct portions of common foods. Discussed importance of controlled and consistent intake throughout the day. Provided examples of ways to balance meals/snacks and encouraged intake of high-fiber, whole grain complex carbohydrates. Pt reports eating a lot of vegetables but, preparing them with a lot of fat. Encouraged pt to limit/avoid high fat foods, encouraged pt to choose non-fat dairy products, and discussed low fat cooking methods. Encouraged pt to discuss physical activity options with physician. Encouraged pt to see an outpatient dietitian at [facility].Teach back method used. Pt plans to eat more fruit and to decrease portion sizes and amount of fat used in cooking.   Expect good compliance.  Current diet order is Heart Healthy/ Carb Modifed, patient is consuming approximately 100% of meals at this time. Labs and medications reviewed. No further nutrition interventions warranted at this time. RD contact information provided. Encouraged pt to contact RD with any questions or concerns. If additional nutrition issues arise, please re-consult RD.  Pryor Ochoa RD, LDN Inpatient Clinical Dietitian Pager: [phone] After Hours Pager: [phone]

## 2013-03-28 NOTE — Progress Notes (Signed)
[place] for Coumadin Indication: pulmonary embolus  No Known Allergies  Patient Measurements: Height: 6\' 2"  (188 cm) Weight:  (bed scale error) IBW/kg (Calculated) : 82.2  Vital Signs: Temp: 98.2 F (36.8 C) (03/13 0800) Temp src: Oral (03/13 0800) BP: 155/71 mmHg (03/13 0800) Pulse Rate: 65 (03/13 0800)  Labs:  Recent Labs  03/26/13 0453 03/27/13 0407 03/28/13 0246  LABPROT 20.5* 22.1* 23.6*  INR 1.82* 2.00* 2.18*    Estimated Creatinine Clearance: 85.5 ml/min (by C-G formula based on Cr of 1.39).  Medications:  Coumadin 10mg  MWF, 5mg  TTSS  Assessment:  68 y/o M with chronic respiratory failure on TC (2006) presented with weakness in LE and knee pain resulting in fall 3/8. He has chronic deconditioning and previously refused NH placement. Patient is on chronic coumadin for hx PE noted 03/16/10. INR remains at goal at 2.18. It appears pt was not given his ordered dose on 3/9 for unclear reasons.   Goal of Therapy:  INR 2-3   Plan:  1. Repeat coumadin 10mg  PO x 1 tonight - this is her normal home dose for Fridays 2. F/u AM INR  Salome Arnt, PharmD, BCPS Pager # [phone] 03/28/2013 8:27 AM

## 2013-03-29 ENCOUNTER — Other Ambulatory Visit: Payer: Self-pay | Admitting: Endocrinology

## 2013-03-29 LAB — PROTIME-INR
INR: 2.17 — AB (ref 0.00–1.49)
Prothrombin Time: 23.5 seconds — ABNORMAL HIGH (ref 11.6–15.2)

## 2013-03-29 LAB — GLUCOSE, CAPILLARY
GLUCOSE-CAPILLARY: 119 mg/dL — AB (ref 70–99)
Glucose-Capillary: 164 mg/dL — ABNORMAL HIGH (ref 70–99)

## 2013-03-29 MED ORDER — GUAIFENESIN ER 600 MG PO TB12: 600.0000 mg | ORAL_TABLET | Freq: Two times a day (BID) | ORAL | Status: DC

## 2013-03-29 MED ORDER — ASPIRIN 81 MG PO CHEW: 81.0000 mg | CHEWABLE_TABLET | Freq: Every day | ORAL | Status: DC

## 2013-03-29 MED ORDER — WARFARIN SODIUM 5 MG PO TABS
5.0000 mg | ORAL_TABLET | Freq: Once | ORAL | Status: DC
Start: 2013-03-29 — End: 2013-03-29
  Filled 2013-03-29: qty 1

## 2013-03-29 NOTE — Progress Notes (Signed)
ANTICOAGULATION CONSULT NOTE - Follow-Up  Pharmacy Consult for Coumadin Indication: pulmonary embolus  No Known Allergies  Patient Measurements: Height: 6\' 2"  (188 cm) Weight: 221 lb (100.245 kg) IBW/kg (Calculated) : 82.2  Vital Signs: Temp: 98.5 F (36.9 C) (03/14 0821) Temp src: Oral (03/14 0821) BP: 131/89 mmHg (03/14 0821) Pulse Rate: 68 (03/14 0821)  Labs:  Recent Labs  03/27/13 0407 03/28/13 0246 03/29/13 0355  LABPROT 22.1* 23.6* 23.5*  INR 2.00* 2.18* 2.17*    Estimated Creatinine Clearance: 65.2 ml/min (by C-G formula based on Cr of 1.39).  Medications:  Coumadin 10mg  MWF, 5mg  TTSS  Assessment:  68 y/o M with chronic respiratory failure on TC (2006) presented with weakness in LE and knee pain resulting in fall 3/8. He has chronic deconditioning and previously refused NH placement. Patient is on chronic coumadin for hx PE noted 03/16/10. INR remains at goal at 2.17. Patient did not get dose on 3/9. Patient's home warfarin regimen will be resumed. No new CBC today. No s/s of bleeding noted.   Goal of Therapy:  INR 2-3   Plan:  - Warfarin 5mg  x 1 today - Monitor CBC, INR, and s/s bleeding  Roderic Palau A. Pincus Badder, PharmD Clinical Pharmacist - Resident Pager: [phone] Pharmacy: [phone] 03/29/2013 9:11 AM

## 2013-03-29 NOTE — Discharge Summary (Signed)
Physician Discharge Summary  Travis Chapman ZOX:[id number] DOB: 27-Dec-1945 DOA: 03/24/2013  PCP: Renato Shin, MD  Admit date: 03/24/2013 Discharge date: 03/29/2013  Time spent: >45 minutes   Discharge Diagnoses:  Principal Problem:   Chest pain Active Problems: Fever, unspecified  Lower extremity weakness   CARDIOMYOPATHY   KNEE PAIN   Diastolic HF (heart failure)- chronic    Chronic respiratory failure   Ventilator dependence at night   Acute and chronic respiratory failure   Tracheostomy status   Obesity hypoventilation syndrome      Discharge Condition: stable  Diet recommendation: heart healthy and diabetic, low fat  Filed Weights   03/24/13 0328 03/24/13 1900 03/29/13 0412  Weight: 183.707 kg (405 lb) 169.645 kg (374 lb) 100.245 kg (221 lb)    History of present illness:  Travis Chapman is a 68 y.o. 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    Hospital Course:  Principal Problem:  Chest pain  - atypical - cardiac enzymes negative. Likely muscles spasm. No further complaints of it.   Active Problems:  Fever (100.4 one time) - improved- possibly atelectasis  - NOTE: sputum culture postitive for serratia and Hemophilus but no signs of infection - likely colonizers and as mentioned above fever resolved without antibiotics.  -  Per pt his is not coughing. UA negative. No GI symptoms. Influenza negative and no leukocytosis.  - Rt knee xray w/o effusion.   Lower extremity  weakness--- Generalized weakness  - actually feeling stronger over last few day  - admitted for the same in the fall  - Per PT eval will need SNF however, only SNF that accepts his insurnace is in [place] and patient would rather go home with home health PT which I have arranged.   CARDIOMYOPATHY-- Diastolic HF (heart failure)  - stable   KNEE PAIN  - likely Osteoarthritis  - currently no pain   Chronic respiratory failure/ OSA [facility]- - Ventilator dependence at night--Tracheostomy status  - stable   DM  - A1c7.3  - sugars stable   Morbid obesity  - advised to lose weight- does not appear to be fluid weight  - dietician has instructed pt  CKD 3  - stable   HTN  - stable   H/o PE - INR was sub therapeutic on admission - cont coumadin-    Procedures:  none  Consultations:  none  Discharge Exam: Filed Vitals:   03/29/13 0929  BP: 139/79  Pulse: 68  Temp: 98.5  Resp: 14    General: No acute respiratory distress- morbidly obese- sitting up in chair  Lungs: Clear to auscultation bilaterally without wheezes or crackles- Trach intact  Cardiovascular: Regular rate and rhythm without murmur gallop or rub normal S1 and S2  Abdomen: Nontender, nondistended, soft, bowel sounds positive, no rebound, no ascites, no appreciable mass  Extremities: No significant cyanosis, clubbing, or edema bilateral lower extremities  Discharge Instructions       Future Appointments Provider Department Dept Phone   04/07/2013 2:30 PM Renato Shin, MD [facility] [phone]       Medication  List    ASK your doctor about these medications       Alcohol Prep 70 % Pads     alprazolam 2 MG tablet  Commonly known as:  XANAX  Take 1 tablet (2 mg total) by mouth at bedtime as needed for anxiety.     amLODipine 10 MG tablet  Commonly known as:  NORVASC  Take 10 mg by mouth daily.     carvedilol 6.25 MG tablet  Commonly known as:  COREG  Take 6.25 mg by  mouth 2 (two) times daily with a meal.     citalopram 40 MG tablet  Commonly known as:  CELEXA  Take 1 tablet (40 mg total) by mouth daily.     cloNIDine 0.1 MG tablet  Commonly known as:  CATAPRES  Take 1 tablet (0.1 mg total) by mouth 2 (two) times daily.     EASY COMFORT INSULIN SYRINGE 30G X 1/2" 1 ML Misc  Generic drug:  Insulin Syringe-Needle U-100     furosemide 40 MG tablet  Commonly known as:  LASIX  Take 40 mg by mouth daily as needed for fluid or edema.     insulin glargine 100 UNIT/ML injection  Commonly known as:  LANTUS  Inject 0.25 mLs (25 Units total) into the skin every morning.     lactulose 10 GM/15ML solution  Commonly known as:  CHRONULAC  Take 30 mLs (20 g total) by mouth 2 (two) times daily.     metoCLOPramide 5 MG tablet  Commonly known as:  REGLAN  Take 5 mg by mouth daily as needed for nausea or vomiting.     multivitamin tablet  Take 1 tablet by mouth daily.     potassium chloride SA 20 MEQ tablet  Commonly known as:  K-DUR,KLOR-CON  Take 40 mEq by mouth daily.     simvastatin 20 MG tablet  Commonly known as:  ZOCOR  Take 1 tablet (20 mg total) by mouth daily.     warfarin 10 MG tablet  Commonly known as:  COUMADIN  Take 5-10 mg by mouth daily. Take 1 tablet (10mg ) by mouth on Monday, Wednesday, and Friday.  Take 1/2 tablet (5mg ) by mouth on tuesday, Thursday, Saturday, and sunday       No Known Allergies    The results of significant diagnostics from this hospitalization (including imaging, microbiology, ancillary and laboratory) are listed below for reference.    Significant Diagnostic Studies: Dg Chest 2 View  03/24/2013   CLINICAL DATA:  Cough.  EXAM: CHEST  2 VIEW  COMPARISON:  11/26/2012 and 11/24/2012  FINDINGS: Tracheostomy tube has tip approximately 7 cm above the carina. Lungs are adequately inflated without focal consolidation or effusion. There is stable cardiomegaly. Remainder the exam is unchanged.  IMPRESSION: No active  cardiopulmonary disease.  Stable cardiomegaly.  Tracheostomy tube unchanged.   Electronically Signed   By: Marin Olp M.D.   On: 03/24/2013 08:59   Dg Knee Complete 4 Views Left  03/24/2013   CLINICAL DATA:  Bilateral knee pain.  EXAM: LEFT KNEE - COMPLETE 4+ VIEW  COMPARISON:  11/24/2012.  FINDINGS: Multiple views of the left knee demonstrate no definite acute displaced fracture, subluxation or dislocation. Severe tricompartmental joint space narrowing, subchondral sclerosis, subchondral cyst formation and osteophyte formation, compatible with advanced osteoarthritis, most severe in the medial and patellofemoral compartments. Numerous ossified loose bodies are again noted in the suprapatellar bursa and posterior to the joint space, suspicious for potential synovial osteochondromatosis.  IMPRESSION: 1. No acute abnormality. 2. Chronic degenerative changes of advanced tricompartmental osteoarthritis, most severe in the medial and patellofemoral compartments, redemonstrated. 3. Changes suggestive of synovial osteochondromatosis again noted.   Electronically Signed   By: Vinnie Langton M.D.   On: 03/24/2013 09:02   Dg Knee Complete 4 Views Right  03/24/2013   CLINICAL DATA:  Bilateral knee pain right greater than left, no known injury  EXAM: RIGHT KNEE - COMPLETE 4+ VIEW  COMPARISON:  11/24/2012  FINDINGS: Mild osseous demineralization.  Tricompartmental osteoarthritic changes with joint space narrowing and marginal spur formation, with bone-on-bone appearance at the medial compartment.  Bulky spur formation and calcified loose bodies and debris at the suprapatellar recess.  Small patellar spur at quadriceps tendon insertion.  Mild infrapatellar soft tissue swelling anteriorly.  No acute fracture, dislocation or bone destruction.  IMPRESSION: Advanced at osteoarthritic changes of the right knee with calcified loose bodies and debris at the suprapatellar recess.   Electronically Signed   By: Lavonia Dana M.D.    On: 03/24/2013 09:11    Microbiology: Recent Results (from the past 240 hour(s))  CULTURE, RESPIRATORY (NON-EXPECTORATED)     Status: None   Collection Time    03/24/13  9:17 AM      Result Value Ref Range Status   Specimen Description TRACHEAL ASPIRATE   Final   Special Requests Normal   Final   Gram Stain     Final   Value: ABUNDANT WBC PRESENT, PREDOMINANTLY PMN     RARE SQUAMOUS EPITHELIAL CELLS PRESENT     ABUNDANT GRAM NEGATIVE COCCI     ABUNDANT GRAM NEGATIVE RODS     ABUNDANT GRAM POSITIVE RODS   Culture     Final   Value: MODERATE SERRATIA MARCESCENS     ABUNDANT HAEMOPHILUS INFLUENZAE     Note: BETA LACTAMASE NEGATIVE     ABUNDANT MORAXELLA CATARRHALIS(BRANHAMELLA)     Note: BETA LACTAMASE POSITIVE   Report Status 03/27/2013 FINAL   Final   Organism ID, Bacteria SERRATIA MARCESCENS   Final  MRSA PCR SCREENING     Status: None   Collection Time    03/24/13  7:09 PM      Result Value Ref Range Status   MRSA by PCR NEGATIVE  NEGATIVE Final   Comment:            The GeneXpert MRSA Assay (FDA     approved for NASAL specimens     only), is one component of a     comprehensive MRSA colonization     surveillance program. It is not     intended to diagnose MRSA     infection nor to guide or     monitor treatment for     MRSA infections.     Labs: Basic Metabolic Panel:  Recent Labs Lab 03/24/13 0339 03/25/13 0250  NA 142 141  K 4.4 4.4  CL 103 103  CO2 29 31  GLUCOSE 110* 93  BUN 20 16  CREATININE 1.59* 1.39*  CALCIUM 8.7 8.5   Liver Function Tests:  Recent Labs Lab 03/24/13 0339  AST 16  ALT 12  ALKPHOS 74  BILITOT 0.2*  PROT 6.9  ALBUMIN 3.0*   No results found for this basename: LIPASE, AMYLASE,  in the last 168 hours No results found for this basename: AMMONIA,  in the last 168 hours CBC:  Recent Labs Lab 03/24/13 0339 03/25/13 0250  WBC 5.6 5.2  HGB 11.6* 11.1*  HCT 36.2* 34.3*  MCV 90.5 90.5  PLT 147* 157   Cardiac  Enzymes:  Recent Labs Lab 03/24/13 0945 03/24/13 1411 03/24/13 2019  TROPONINI <0.30 <0.30 <0.30   BNP: BNP (last 3 results)  Recent Labs  11/24/12 1250 11/26/12 0600 03/10/13 1630  PROBNP 264.4* 596.6* 58.0   CBG:  Recent Labs Lab 03/27/13 2018 03/28/13 0803 03/28/13 1223 03/28/13 1632 03/29/13 0759  GLUCAP 180* 111* 181* 149* 119*       SignedDebbe Odea, MD [facility] 03/29/2013, 10:55 AM

## 2013-03-29 NOTE — Progress Notes (Signed)
Patient removed from ventilator and placed on room air. No complications. Vital signs stable. Vent at bedside if needed. RN aware. RT will continue to monitor.

## 2013-03-29 NOTE — Progress Notes (Signed)
Pt discharged home with belongings in care of brother.  Pt teaching accepted.  Pt alert and oriented without questions or concerns prior to discharge

## 2013-03-30 ENCOUNTER — Other Ambulatory Visit: Payer: Self-pay | Admitting: Endocrinology

## 2013-03-30 NOTE — Progress Notes (Signed)
   CARE MANAGEMENT NOTE 03/30/2013  Patient:  Travis Chapman, Travis Chapman   Account Number:  [id number]  Date Initiated:  03/25/2013  Documentation initiated by:  MAYO,HENRIETTA  Subjective/Objective Assessment:   dx atypical chest pain; chronic trach and nocturnal vent through [place]. Lives with mother- brother and dtr assist as needed     Action/Plan:   Anticipated DC Date:     Anticipated DC Plan:    In-house referral  Clinical Social Worker      DC Forensic scientist  CM consult      Choice offered to / List presented to:  C-1 Patient        [place] arranged  HH-1 RN  [place].   Status of service:  Completed, signed off Medicare Important Message given?   (If response is "NO", the following Medicare IM given date fields will be blank) Date Medicare IM given:   Date Additional Medicare IM given:    Discharge Disposition:  [place]  Per UR Regulation:  Reviewed for med. necessity/level of care/duration of stay  If discussed at [place] of Stay Meetings, dates discussed:    Comments:  03/30/13/ 07:16 CM notified [facility] of pt discharge 03/29/13.  No other CM needs were communicated.  Mariane Masters, BSN, [place] [phone]   Contact:  Wylie Hail [phone]  03/28/13 [place] RN MSN BSN CCM Per CSW, pt will probably need to transfer to vent SNF in [place].  Discussed with pt and he states he would prefer to go home with home health RN, PT, and OT. Provided list of agencies, referral made per choice.  Dtr is out of town until 3/15 but he will ask brother to assist as needed.  PT to re-eval. 1216  Per PT, pt has improved, will need bariatric RW, states his is "rickety."  [facility] liaison will talk with pt about cost as pt received walker just one year ago  03/26/13 [place] PT recommending SNF; OT recommends [facility] but they will not take due to nocturnal vent, recommend SNF for rehab.  CSW  notified.

## 2013-04-07 ENCOUNTER — Encounter: Payer: Self-pay | Admitting: Endocrinology

## 2013-04-07 ENCOUNTER — Other Ambulatory Visit: Payer: Self-pay | Admitting: Endocrinology

## 2013-04-07 ENCOUNTER — Ambulatory Visit (INDEPENDENT_AMBULATORY_CARE_PROVIDER_SITE_OTHER): Payer: Medicare Other | Admitting: Endocrinology

## 2013-04-07 VITALS — BP 128/90 | HR 69 | Temp 98.5°F

## 2013-04-07 DIAGNOSIS — I5030 Unspecified diastolic (congestive) heart failure: Secondary | ICD-10-CM

## 2013-04-07 MED ORDER — OXYCODONE-ACETAMINOPHEN 10-325 MG PO TABS: 1.0000 | ORAL_TABLET | ORAL | Status: DC | PRN

## 2013-04-07 NOTE — Progress Notes (Signed)
Subjective:    Patient ID: Travis Chapman, male    DOB: 1945/04/01, 68 y.o.   MRN: [id number]  HPI The state of at least three ongoing medical problems is addressed today, with interval history of each noted here: CHF: he denies sob Chronic pain syndrome: pt says pain control is much better now. Pt was recently in the hospital with fever: it is resolved. Past Medical History  Diagnosis Date  . ANXIETY 07/23/2006  . ASTHMA 07/23/2006  . DM 05/23/2007  . HYPERTENSION 07/23/2006  . OSTEOARTHRITIS 07/23/2006  . HYPERCHOLESTEROLEMIA 05/23/2007  . ANEMIA 08/31/2008  . Palpitations 02/10/2008  . ALCOHOL ABUSE, IN REMISSION, HX OF 08/26/2008  . TOBACCO ABUSE, HX OF 08/26/2008  . Pulmonary embolism 03/16/2010  . ED (erectile dysfunction)   . Morbid obesity   . DM retinopathy   . 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 DM neuropathy with neurologic complication   . CHF (congestive heart failure)     With a preserved EF  . 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 Sleep apnea   . Shortness of breath   . CARCINOMA, PROSTATE 09/29/2009    Past Surgical History  Procedure Laterality Date  . Back surgery    . Tracheostomy      History   Social History  . Marital Status: Divorced    Spouse Name: N/A    Number of Children: N/A  . Years of Education: N/A   Occupational History  . Disabled    Social History Main Topics  . Smoking status: Former Smoker -- 0.50 packs/day for 15 years    Types: Cigarettes    Quit date: 01/16/2005  . Smokeless tobacco: Not on file  . Alcohol Use: No  . Drug Use: Not on file  . Sexual Activity: Not on file   Other Topics Concern  . Not on file   Social History Narrative   Single   Lives with his elderly mother          Current Outpatient Prescriptions on File Prior to Visit  Medication Sig Dispense Refill  . Alcohol Swabs (ALCOHOL PREP) 70 % PADS       . ALPRAZolam (XANAX)  2 MG tablet Take 1 tablet (2 mg total) by mouth at bedtime as needed for anxiety.    0  . amLODipine (NORVASC) 10 MG tablet Take 10 mg by mouth daily.      Marland Kitchen aspirin 81 MG chewable tablet Chew 1 tablet (81 mg total) by mouth daily.      . carvedilol (COREG) 6.25 MG tablet Take 6.25 mg by mouth 2 (two) times daily with a meal.      . citalopram (CELEXA) 40 MG tablet Take 1 tablet (40 mg total) by mouth daily.  30 tablet  4  . cloNIDine (CATAPRES) 0.1 MG tablet Take 1 tablet (0.1 mg total) by mouth 2 (two) times daily.  60 tablet  1  . EASY COMFORT INSULIN SYRINGE 30G X 1/2" 1 ML MISC       . furosemide (LASIX) 40 MG tablet Take 40 mg by mouth daily.       Marland Kitchen guaiFENesin (MUCINEX) 600 MG 12 hr tablet Take 1 tablet (600 mg total) by mouth 2 (two) times daily.  60 suppository  0  . insulin glargine (LANTUS) 100 UNIT/ML injection Inject 0.25 mLs (25 Units total) into the  skin every morning.  10 mL  3  . lactulose (CHRONULAC) 10 GM/15ML solution Take 30 mLs (20 g total) by mouth 2 (two) times daily.  240 mL  11  . metoCLOPramide (REGLAN) 5 MG tablet Take 5 mg by mouth daily as needed for nausea or vomiting.      . Multiple Vitamin (MULTIVITAMIN) tablet Take 1 tablet by mouth daily.       Marland Kitchen warfarin (COUMADIN) 10 MG tablet Take 5-10 mg by mouth daily. Take 1 tablet (10mg ) by mouth on Monday, Wednesday, and Friday.  Take 1/2 tablet (5mg ) by mouth on tuesday, Thursday, Saturday, and sunday       No current facility-administered medications on file prior to visit.    No Known Allergies  Family History  Problem Relation Age of Onset  . Cancer Neg Hx     BP 128/90  Pulse 69  Temp(Src) 98.5 F (36.9 C) (Oral)  SpO2 95%   Review of Systems Chest pain is resolved.  Denies dysuria.      Objective:   Physical Exam VITAL SIGNS:  See vs page GENERAL: no distress.  Morbid obesity.  In wheelchair. LUNGS:  Clear to auscultation      Assessment & Plan:  Chronic pain syndrome:  well-controlled CHF: well-controlled Febrile illness: resolved

## 2013-04-07 NOTE — Patient Instructions (Addendum)
blood tests are being requested for you today.  We'll contact you with results. check your blood sugar twice a day.  vary the time of day when you check, between before the 3 meals, and at bedtime.  also check if you have symptoms of your blood sugar being too high or too low.  please keep a record of the readings and bring it to your next appointment here.  You can write it on any piece of paper.  please call us sooner if your blood sugar goes below 70, or if you have a lot of readings over 200. Please come back for a follow-up appointment in 2 months. Here are 2 prescriptions for your pain pill--1 for now, and 1 for next month.

## 2013-04-10 ENCOUNTER — Other Ambulatory Visit: Payer: Self-pay

## 2013-04-10 MED ORDER — ONETOUCH ULTRASOFT LANCETS MISC: Status: DC

## 2013-04-10 MED ORDER — ONETOUCH ULTRA 2 W/DEVICE KIT: PACK | Status: DC

## 2013-04-10 MED ORDER — GLUCOSE BLOOD VI STRP: ORAL_STRIP | Status: DC

## 2013-04-14 ENCOUNTER — Other Ambulatory Visit: Payer: Self-pay | Admitting: Endocrinology

## 2013-04-14 ENCOUNTER — Telehealth: Payer: Self-pay | Admitting: Endocrinology

## 2013-04-14 MED ORDER — GLUCOSE BLOOD VI STRP: ORAL_STRIP | Status: DC

## 2013-04-14 NOTE — Telephone Encounter (Signed)
Pt states he received his One touch ultra 2 meter but no strips   [facility] on [address]  :)

## 2013-04-14 NOTE — Telephone Encounter (Signed)
Strips were sent on 3/26. Strips resent 3/30.

## 2013-04-14 NOTE — Telephone Encounter (Signed)
i printed 

## 2013-04-16 ENCOUNTER — Telehealth: Payer: Self-pay

## 2013-04-16 NOTE — Telephone Encounter (Signed)
Dianna, pt's case manager with [employer] called wanting Dr to be informed with medication interactions. Carvedilol and [place]in[place]. Celexa with Warfarin and Simvastatin.

## 2013-04-19 ENCOUNTER — Other Ambulatory Visit: Payer: Self-pay | Admitting: Endocrinology

## 2013-04-21 ENCOUNTER — Telehealth: Payer: Self-pay

## 2013-04-21 NOTE — Telephone Encounter (Signed)
Scott with [facility] called requesting a verbal order that an extension be made for the pt's home health visits. Home Health is requesting 1 visit for this week and then 2 visits for next week. The nurse visits are for wound care.  Please advise during Dr. Cordelia Pen absence.  Thanks!

## 2013-04-21 NOTE — Telephone Encounter (Signed)
Approved.  

## 2013-04-21 NOTE — Telephone Encounter (Signed)
Please advise if ok to refill during Dr. Cordelia Pen absence. Thanks!

## 2013-04-21 NOTE — Telephone Encounter (Signed)
[facility] with [facility] notified.

## 2013-04-22 ENCOUNTER — Telehealth: Payer: Self-pay | Admitting: General Practice

## 2013-04-22 NOTE — Telephone Encounter (Signed)
Faxed order to [facility] to check patient's INR.

## 2013-04-25 ENCOUNTER — Telehealth: Payer: Self-pay | Admitting: Endocrinology

## 2013-04-25 ENCOUNTER — Telehealth: Payer: Self-pay | Admitting: *Deleted

## 2013-04-25 NOTE — Telephone Encounter (Signed)
Pt called asking if Dr Loanne Drilling can admit him in the hospital. Advised him that he is [place] on vacation. Asked him what is going on. Pt stated he is such pain, his knees are swollen, he can hardly move them it feels like someone is putting a knife in them. Pt asked if another physician can help get him in the hospital. Advised pt we would check with another physician and contact him back. Spoke with Hoyle Sauer (and York Cerise) at the [facility]. She advised that they would call him back and put him with Call a Nurse or advise him to go to the ED. Be advised.

## 2013-04-25 NOTE — Telephone Encounter (Signed)
Patient Information:  Caller Name: [facility]  Phone: [phone]  Patient: Travis Chapman, Travis Chapman  Gender: Male  DOB: [birth year]  Age: 68 Years  PCP: Renato Shin (Adults only)  Office Follow Up:  Does the office need to follow up with this patient?: No  Instructions For The Office: N/A   Symptoms  Reason For Call & Symptoms: Patient calling to see if he is going to be admitted to the hospital as earlier requested?  Per note in his chart he needs to be triaged.  He states that he is "dragging around" and has been seen for same 4 times recently.  His knees are painful and swollen.  The pain is a 7/10 right now.  He walks with a walker.  He is taking his pain medication as ordered with minimal relief.  Reviewed Health History In EMR: Yes  Reviewed Medications In EMR: Yes  Reviewed Allergies In EMR: Yes  Reviewed Surgeries / Procedures: Yes  Date of Onset of Symptoms: 04/18/2013  Guideline(s) Used:  Knee Pain  Disposition Per Guideline:   See Within 3 Days in Office  Reason For Disposition Reached:   Moderate pain (e.g., symptoms interfere with work or school, limping) and present > 3 days  Advice Given:  Knee Pain after Overuse:   Apply Cold to the Area: Apply a cold pack or ice bag (wrapped in a moist towel) to the area for 20 minutes. Repeat in 1 hour, then every 4 hours while awake. Continue this for the first 48 hours after an overuse injury (Reason: reduce the swelling and pain).  Rest Your Knee   for the next couple days. Avoid activities that worsen your pain. Reduce activities that put a lot of strain on the knee joint (e.g., deep knee bends, stair climbing, running).  Call Back If:  You become worse.  Patient Will Follow Care Advice:  YES

## 2013-04-26 ENCOUNTER — Encounter (HOSPITAL_COMMUNITY): Payer: Self-pay | Admitting: Emergency Medicine

## 2013-04-26 ENCOUNTER — Inpatient Hospital Stay (HOSPITAL_COMMUNITY)
Admission: EM | Admit: 2013-04-26 | Discharge: 2013-04-30 | DRG: 682 | Disposition: A | Discharge status: Home / Self Care | Payer: Medicare Other | Attending: Internal Medicine | Admitting: Internal Medicine

## 2013-04-26 ENCOUNTER — Emergency Department (HOSPITAL_COMMUNITY): Payer: Medicare Other

## 2013-04-26 DIAGNOSIS — J9620 Acute and chronic respiratory failure, unspecified whether with hypoxia or hypercapnia: Secondary | ICD-10-CM

## 2013-04-26 DIAGNOSIS — E7800 Pure hypercholesterolemia, unspecified: Secondary | ICD-10-CM | POA: Diagnosis present

## 2013-04-26 DIAGNOSIS — Z86711 Personal history of pulmonary embolism: Secondary | ICD-10-CM

## 2013-04-26 DIAGNOSIS — N183 Chronic kidney disease, stage 3 unspecified: Secondary | ICD-10-CM | POA: Diagnosis present

## 2013-04-26 DIAGNOSIS — Z9911 Dependence on respirator [ventilator] status: Secondary | ICD-10-CM

## 2013-04-26 DIAGNOSIS — Z87891 Personal history of nicotine dependence: Secondary | ICD-10-CM

## 2013-04-26 DIAGNOSIS — E1049 Type 1 diabetes mellitus with other diabetic neurological complication: Secondary | ICD-10-CM | POA: Diagnosis present

## 2013-04-26 DIAGNOSIS — N289 Disorder of kidney and ureter, unspecified: Secondary | ICD-10-CM

## 2013-04-26 DIAGNOSIS — I428 Other cardiomyopathies: Secondary | ICD-10-CM | POA: Diagnosis present

## 2013-04-26 DIAGNOSIS — C61 Malignant neoplasm of prostate: Secondary | ICD-10-CM | POA: Diagnosis present

## 2013-04-26 DIAGNOSIS — K3184 Gastroparesis: Secondary | ICD-10-CM

## 2013-04-26 DIAGNOSIS — R5383 Other fatigue: Secondary | ICD-10-CM

## 2013-04-26 DIAGNOSIS — Z930 Tracheostomy status: Secondary | ICD-10-CM

## 2013-04-26 DIAGNOSIS — I129 Hypertensive chronic kidney disease with stage 1 through stage 4 chronic kidney disease, or unspecified chronic kidney disease: Secondary | ICD-10-CM | POA: Diagnosis present

## 2013-04-26 DIAGNOSIS — Z79899 Other long term (current) drug therapy: Secondary | ICD-10-CM

## 2013-04-26 DIAGNOSIS — I2699 Other pulmonary embolism without acute cor pulmonale: Secondary | ICD-10-CM | POA: Diagnosis present

## 2013-04-26 DIAGNOSIS — M1711 Unilateral primary osteoarthritis, right knee: Secondary | ICD-10-CM

## 2013-04-26 DIAGNOSIS — I10 Essential (primary) hypertension: Secondary | ICD-10-CM

## 2013-04-26 DIAGNOSIS — I4729 Other ventricular tachycardia: Secondary | ICD-10-CM | POA: Diagnosis present

## 2013-04-26 DIAGNOSIS — E785 Hyperlipidemia, unspecified: Secondary | ICD-10-CM | POA: Diagnosis present

## 2013-04-26 DIAGNOSIS — Z794 Long term (current) use of insulin: Secondary | ICD-10-CM

## 2013-04-26 DIAGNOSIS — H5440 Blindness, one eye, unspecified eye: Secondary | ICD-10-CM | POA: Diagnosis present

## 2013-04-26 DIAGNOSIS — I5033 Acute on chronic diastolic (congestive) heart failure: Secondary | ICD-10-CM | POA: Diagnosis present

## 2013-04-26 DIAGNOSIS — R262 Difficulty in walking, not elsewhere classified: Secondary | ICD-10-CM | POA: Diagnosis present

## 2013-04-26 DIAGNOSIS — R5381 Other malaise: Secondary | ICD-10-CM

## 2013-04-26 DIAGNOSIS — R531 Weakness: Secondary | ICD-10-CM | POA: Diagnosis present

## 2013-04-26 DIAGNOSIS — I5030 Unspecified diastolic (congestive) heart failure: Secondary | ICD-10-CM

## 2013-04-26 DIAGNOSIS — E1065 Type 1 diabetes mellitus with hyperglycemia: Secondary | ICD-10-CM | POA: Diagnosis present

## 2013-04-26 DIAGNOSIS — N179 Acute kidney failure, unspecified: Principal | ICD-10-CM | POA: Diagnosis present

## 2013-04-26 DIAGNOSIS — Z6841 Body Mass Index (BMI) 40.0 and over, adult: Secondary | ICD-10-CM

## 2013-04-26 DIAGNOSIS — I493 Ventricular premature depolarization: Secondary | ICD-10-CM

## 2013-04-26 DIAGNOSIS — M1710 Unilateral primary osteoarthritis, unspecified knee: Secondary | ICD-10-CM | POA: Diagnosis present

## 2013-04-26 DIAGNOSIS — E1029 Type 1 diabetes mellitus with other diabetic kidney complication: Secondary | ICD-10-CM

## 2013-04-26 DIAGNOSIS — M1990 Unspecified osteoarthritis, unspecified site: Secondary | ICD-10-CM | POA: Diagnosis present

## 2013-04-26 DIAGNOSIS — G4733 Obstructive sleep apnea (adult) (pediatric): Secondary | ICD-10-CM | POA: Diagnosis present

## 2013-04-26 DIAGNOSIS — M25569 Pain in unspecified knee: Secondary | ICD-10-CM | POA: Diagnosis present

## 2013-04-26 DIAGNOSIS — J9610 Chronic respiratory failure, unspecified whether with hypoxia or hypercapnia: Secondary | ICD-10-CM | POA: Diagnosis present

## 2013-04-26 DIAGNOSIS — E1143 Type 2 diabetes mellitus with diabetic autonomic (poly)neuropathy: Secondary | ICD-10-CM | POA: Diagnosis present

## 2013-04-26 DIAGNOSIS — E662 Morbid (severe) obesity with alveolar hypoventilation: Secondary | ICD-10-CM

## 2013-04-26 DIAGNOSIS — I509 Heart failure, unspecified: Secondary | ICD-10-CM | POA: Diagnosis present

## 2013-04-26 DIAGNOSIS — I472 Ventricular tachycardia: Secondary | ICD-10-CM

## 2013-04-26 DIAGNOSIS — R079 Chest pain, unspecified: Secondary | ICD-10-CM

## 2013-04-26 DIAGNOSIS — N058 Unspecified nephritic syndrome with other morphologic changes: Secondary | ICD-10-CM | POA: Diagnosis present

## 2013-04-26 LAB — COMPREHENSIVE METABOLIC PANEL
ALK PHOS: 71 U/L (ref 39–117)
ALT: 13 U/L (ref 0–53)
AST: 19 U/L (ref 0–37)
Albumin: 3.3 g/dL — ABNORMAL LOW (ref 3.5–5.2)
BUN: 30 mg/dL — ABNORMAL HIGH (ref 6–23)
CO2: 24 mEq/L (ref 19–32)
CREATININE: 2 mg/dL — AB (ref 0.50–1.35)
Calcium: 9.2 mg/dL (ref 8.4–10.5)
Chloride: 101 mEq/L (ref 96–112)
GFR calc non Af Amer: 33 mL/min — ABNORMAL LOW (ref >90)
GFR, EST AFRICAN AMERICAN: 38 mL/min — AB (ref >90)
GLUCOSE: 116 mg/dL — AB (ref 70–99)
POTASSIUM: 5.1 meq/L (ref 3.7–5.3)
Sodium: 139 mEq/L (ref 137–147)
TOTAL PROTEIN: 7.2 g/dL (ref 6.0–8.3)
Total Bilirubin: 0.2 mg/dL — ABNORMAL LOW (ref 0.3–1.2)

## 2013-04-26 LAB — URINE MICROSCOPIC-ADD ON

## 2013-04-26 LAB — SODIUM, URINE, RANDOM: SODIUM UR: 129 meq/L

## 2013-04-26 LAB — CBC WITH DIFFERENTIAL/PLATELET
BASOS PCT: 0 % (ref 0–1)
Basophils Absolute: 0 10*3/uL (ref 0.0–0.1)
EOS PCT: 3 % (ref 0–5)
Eosinophils Absolute: 0.2 10*3/uL (ref 0.0–0.7)
HEMATOCRIT: 35.1 % — AB (ref 39.0–52.0)
HEMOGLOBIN: 11.6 g/dL — AB (ref 13.0–17.0)
LYMPHS ABS: 1.1 10*3/uL (ref 0.7–4.0)
Lymphocytes Relative: 19 % (ref 12–46)
MCH: 29.7 pg (ref 26.0–34.0)
MCHC: 33 g/dL (ref 30.0–36.0)
MCV: 90 fL (ref 78.0–100.0)
MONOS PCT: 7 % (ref 3–12)
Monocytes Absolute: 0.4 10*3/uL (ref 0.1–1.0)
NEUTROS PCT: 71 % (ref 43–77)
Neutro Abs: 4.1 10*3/uL (ref 1.7–7.7)
Platelets: 150 10*3/uL (ref 150–400)
RBC: 3.9 MIL/uL — AB (ref 4.22–5.81)
RDW: 13.5 % (ref 11.5–15.5)
WBC: 5.8 10*3/uL (ref 4.0–10.5)

## 2013-04-26 LAB — URINALYSIS, ROUTINE W REFLEX MICROSCOPIC
Bilirubin Urine: NEGATIVE
Bilirubin Urine: NEGATIVE
GLUCOSE, UA: NEGATIVE mg/dL
Glucose, UA: NEGATIVE mg/dL
Hgb urine dipstick: NEGATIVE
Hgb urine dipstick: NEGATIVE
KETONES UR: NEGATIVE mg/dL
Ketones, ur: NEGATIVE mg/dL
LEUKOCYTES UA: NEGATIVE
LEUKOCYTES UA: NEGATIVE
NITRITE: NEGATIVE
Nitrite: NEGATIVE
PH: 5.5 (ref 5.0–8.0)
PROTEIN: 30 mg/dL — AB
Protein, ur: >300 mg/dL — AB
Specific Gravity, Urine: 1.031 — ABNORMAL HIGH (ref 1.005–1.030)
Specific Gravity, Urine: 1.009 (ref 1.005–1.030)
Urobilinogen, UA: 0.2 mg/dL (ref 0.0–1.0)
Urobilinogen, UA: 0.2 mg/dL (ref 0.0–1.0)
pH: 6.5 (ref 5.0–8.0)

## 2013-04-26 LAB — CBG MONITORING, ED: Glucose-Capillary: 114 mg/dL — ABNORMAL HIGH (ref 70–99)

## 2013-04-26 LAB — CREATININE, URINE, RANDOM: CREATININE, URINE: 12.54 mg/dL

## 2013-04-26 LAB — PROTIME-INR
INR: 1.13 (ref 0.00–1.49)
Prothrombin Time: 14.3 seconds (ref 11.6–15.2)

## 2013-04-26 LAB — MRSA PCR SCREENING: MRSA BY PCR: NEGATIVE

## 2013-04-26 LAB — OSMOLALITY, URINE: OSMOLALITY UR: 329 mosm/kg — AB (ref 390–1090)

## 2013-04-26 LAB — URIC ACID: Uric Acid, Serum: 4.7 mg/dL (ref 4.0–7.8)

## 2013-04-26 MED ORDER — OXYCODONE HCL 5 MG PO TABS
5.0000 mg | ORAL_TABLET | Freq: Four times a day (QID) | ORAL | Status: DC | PRN
  Administered 2013-04-27 – 2013-04-28 (×4): 5 mg via ORAL
  Filled 2013-04-26 (×4): qty 1

## 2013-04-26 MED ORDER — GUAIFENESIN-DM 100-10 MG/5ML PO SYRP
5.0000 mL | ORAL_SOLUTION | ORAL | Status: DC | PRN
  Filled 2013-04-26: qty 5

## 2013-04-26 MED ORDER — OXYCODONE-ACETAMINOPHEN 5-325 MG PO TABS
1.0000 | ORAL_TABLET | Freq: Four times a day (QID) | ORAL | Status: DC | PRN
  Administered 2013-04-26 – 2013-04-30 (×9): 1 via ORAL
  Filled 2013-04-26 (×9): qty 1

## 2013-04-26 MED ORDER — HYDROCODONE-ACETAMINOPHEN 5-325 MG PO TABS
1.0000 | ORAL_TABLET | ORAL | Status: DC | PRN
  Administered 2013-04-28: 1 via ORAL
  Administered 2013-04-29 – 2013-04-30 (×4): 2 via ORAL
  Filled 2013-04-26: qty 2
  Filled 2013-04-26: qty 1
  Filled 2013-04-26 (×3): qty 2

## 2013-04-26 MED ORDER — ONDANSETRON HCL 4 MG/2ML IJ SOLN: 4.0000 mg | Freq: Four times a day (QID) | INTRAMUSCULAR | Status: DC | PRN

## 2013-04-26 MED ORDER — OXYCODONE-ACETAMINOPHEN 10-325 MG PO TABS: 1.0000 | ORAL_TABLET | Freq: Four times a day (QID) | ORAL | Status: DC | PRN

## 2013-04-26 MED ORDER — CARVEDILOL 6.25 MG PO TABS
6.2500 mg | ORAL_TABLET | Freq: Two times a day (BID) | ORAL | Status: DC
  Administered 2013-04-26 – 2013-04-30 (×9): 6.25 mg via ORAL
  Filled 2013-04-26 (×10): qty 1

## 2013-04-26 MED ORDER — AMLODIPINE BESYLATE 10 MG PO TABS
10.0000 mg | ORAL_TABLET | Freq: Every day | ORAL | Status: DC
  Administered 2013-04-27 – 2013-04-30 (×4): 10 mg via ORAL
  Filled 2013-04-26 (×4): qty 1

## 2013-04-26 MED ORDER — GUAIFENESIN ER 600 MG PO TB12
600.0000 mg | ORAL_TABLET | Freq: Two times a day (BID) | ORAL | Status: DC
  Administered 2013-04-26 – 2013-04-30 (×8): 600 mg via ORAL
  Filled 2013-04-26 (×9): qty 1

## 2013-04-26 MED ORDER — POLYETHYLENE GLYCOL 3350 17 G PO PACK
17.0000 g | PACK | Freq: Every day | ORAL | Status: DC | PRN
  Filled 2013-04-26: qty 1

## 2013-04-26 MED ORDER — CLONIDINE HCL 0.1 MG PO TABS
0.1000 mg | ORAL_TABLET | Freq: Every day | ORAL | Status: DC
  Administered 2013-04-27 – 2013-04-30 (×4): 0.1 mg via ORAL
  Filled 2013-04-26 (×4): qty 1

## 2013-04-26 MED ORDER — ONDANSETRON HCL 4 MG PO TABS: 4.0000 mg | ORAL_TABLET | Freq: Four times a day (QID) | ORAL | Status: DC | PRN

## 2013-04-26 MED ORDER — WARFARIN SODIUM 10 MG PO TABS
10.0000 mg | ORAL_TABLET | Freq: Once | ORAL | Status: AC
  Administered 2013-04-26: 10 mg via ORAL
  Filled 2013-04-26: qty 1

## 2013-04-26 MED ORDER — SODIUM CHLORIDE 0.9 % IJ SOLN
3.0000 mL | Freq: Two times a day (BID) | INTRAMUSCULAR | Status: DC
  Administered 2013-04-26 – 2013-04-30 (×7): 3 mL via INTRAVENOUS

## 2013-04-26 MED ORDER — NITROGLYCERIN 2 % TD OINT
1.0000 [in_us] | TOPICAL_OINTMENT | Freq: Four times a day (QID) | TRANSDERMAL | Status: DC
Start: 2013-04-26 — End: 2013-04-28
  Administered 2013-04-26 – 2013-04-28 (×9): 1 [in_us] via TOPICAL
  Filled 2013-04-26: qty 30

## 2013-04-26 MED ORDER — CLONIDINE HCL 0.1 MG PO TABS: 0.1000 mg | ORAL_TABLET | Freq: Two times a day (BID) | ORAL | Status: DC

## 2013-04-26 MED ORDER — INSULIN GLARGINE 100 UNIT/ML ~~LOC~~ SOLN
25.0000 [IU] | Freq: Every morning | SUBCUTANEOUS | Status: DC
  Administered 2013-04-27 – 2013-04-30 (×4): 25 [IU] via SUBCUTANEOUS
  Filled 2013-04-26 (×4): qty 0.25

## 2013-04-26 MED ORDER — LACTULOSE 10 GM/15ML PO SOLN
20.0000 g | Freq: Two times a day (BID) | ORAL | Status: DC
  Administered 2013-04-27: 20 g via ORAL
  Filled 2013-04-26 (×9): qty 30

## 2013-04-26 MED ORDER — METOCLOPRAMIDE HCL 5 MG PO TABS: 5.0000 mg | ORAL_TABLET | Freq: Three times a day (TID) | ORAL | Status: DC

## 2013-04-26 MED ORDER — INSULIN ASPART 100 UNIT/ML ~~LOC~~ SOLN
0.0000 [IU] | Freq: Three times a day (TID) | SUBCUTANEOUS | Status: DC
  Administered 2013-04-27 (×2): 1 [IU] via SUBCUTANEOUS
  Administered 2013-04-27 – 2013-04-28 (×3): 2 [IU] via SUBCUTANEOUS
  Administered 2013-04-28 – 2013-04-29 (×3): 1 [IU] via SUBCUTANEOUS
  Administered 2013-04-29 – 2013-04-30 (×2): 2 [IU] via SUBCUTANEOUS

## 2013-04-26 MED ORDER — ALPRAZOLAM 0.5 MG PO TABS
1.0000 mg | ORAL_TABLET | Freq: Three times a day (TID) | ORAL | Status: DC | PRN
  Administered 2013-04-26 – 2013-04-29 (×3): 1 mg via ORAL
  Filled 2013-04-26 (×3): qty 2

## 2013-04-26 MED ORDER — METOCLOPRAMIDE HCL 5 MG PO TABS
5.0000 mg | ORAL_TABLET | Freq: Three times a day (TID) | ORAL | Status: DC
  Administered 2013-04-26 – 2013-04-30 (×16): 5 mg via ORAL
  Filled 2013-04-26 (×18): qty 1

## 2013-04-26 MED ORDER — HYDROMORPHONE HCL PF 1 MG/ML IJ SOLN
1.0000 mg | Freq: Once | INTRAMUSCULAR | Status: AC
  Administered 2013-04-26: 1 mg via INTRAVENOUS
  Filled 2013-04-26: qty 1

## 2013-04-26 MED ORDER — WARFARIN SODIUM 5 MG PO TABS: 5.0000 mg | ORAL_TABLET | Freq: Every day | ORAL | Status: DC

## 2013-04-26 MED ORDER — WARFARIN - PHARMACIST DOSING INPATIENT: Freq: Every day | Status: DC

## 2013-04-26 MED ORDER — INSULIN ASPART 100 UNIT/ML ~~LOC~~ SOLN
0.0000 [IU] | Freq: Every day | SUBCUTANEOUS | Status: DC
  Administered 2013-04-26: 2 [IU] via SUBCUTANEOUS

## 2013-04-26 MED ORDER — CITALOPRAM HYDROBROMIDE 40 MG PO TABS
40.0000 mg | ORAL_TABLET | Freq: Every day | ORAL | Status: DC
  Administered 2013-04-27 – 2013-04-28 (×2): 40 mg via ORAL
  Filled 2013-04-26 (×2): qty 1

## 2013-04-26 MED ORDER — SIMVASTATIN 20 MG PO TABS
20.0000 mg | ORAL_TABLET | Freq: Every day | ORAL | Status: DC
  Administered 2013-04-27 – 2013-04-30 (×4): 20 mg via ORAL
  Filled 2013-04-26 (×4): qty 1

## 2013-04-26 MED ORDER — FUROSEMIDE 10 MG/ML IJ SOLN
40.0000 mg | Freq: Two times a day (BID) | INTRAMUSCULAR | Status: DC
  Administered 2013-04-26: 40 mg via INTRAVENOUS
  Filled 2013-04-26 (×3): qty 4

## 2013-04-26 NOTE — ED Notes (Signed)
Admitting MD at bedside.

## 2013-04-26 NOTE — ED Notes (Signed)
RT at bedside to suction PT at the request of daughter.

## 2013-04-26 NOTE — Progress Notes (Signed)
ANTICOAGULATION CONSULT NOTE - Initial Consult  Pharmacy Consult for Coumadin  Indication: H/o PE   No Known Allergies  Patient Measurements:   Heparin Dosing Weight: n/a  Vital Signs: Temp: 98.3 F (36.8 C) (04/11 1008) Temp src: Oral (04/11 1008) BP: 156/74 mmHg (04/11 1400) Pulse Rate: 93 (04/11 1400)  Labs:  Recent Labs  04/26/13 1115  HGB 11.6*  HCT 35.1*  PLT 150  LABPROT 14.3  INR 1.13  CREATININE 2.00*    The CrCl is unknown because both a height and weight (above a minimum accepted value) are required for this calculation.   Medical History: Past Medical History  Diagnosis Date  . ANXIETY 07/23/2006  . ASTHMA 07/23/2006  . DM 05/23/2007  . HYPERTENSION 07/23/2006  . OSTEOARTHRITIS 07/23/2006  . HYPERCHOLESTEROLEMIA 05/23/2007  . ANEMIA 08/31/2008  . Palpitations 02/10/2008  . ALCOHOL ABUSE, IN REMISSION, HX OF 08/26/2008  . TOBACCO ABUSE, HX OF 08/26/2008  . Pulmonary embolism 03/16/2010  . ED (erectile dysfunction)   . Morbid obesity   . DM retinopathy   . 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 DM neuropathy with neurologic complication   . CHF (congestive heart failure)     With a preserved EF  . 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 Sleep apnea   . Shortness of breath   . CARCINOMA, PROSTATE 09/29/2009    Medications:   (Not in a hospital admission)  Assessment: 46 YOM with h/o of PE presents with CC of generalized weakness, SOB and worsening swelling in both lower extremities. Pharmacy to resume home coumadin. INR today was subtherapeutic at 1.13.   Home coumadin dose: 10 mg on MWF, 5 mg on all other days   Goal of Therapy:  INR 2-3 Monitor platelets by anticoagulation protocol: Yes   Plan:  1) Coumadin 10 mg x 1 dose tonight 2) F/u daily INR  3) Monitor for s/s of bleeding   Albertina Parr, PharmD.  Clinical Pharmacist Pager [phone]

## 2013-04-26 NOTE — ED Notes (Signed)
Family and Pt requested to have Pt's trach suctioned. No mucous was suctioned out. Daughter stated "He has had the trach for 9 years He knows when suctioned is needed." Daughter then left room.

## 2013-04-26 NOTE — H&P (Addendum)
Patient Demographics  Travis Chapman, is a 68 y.o. male  MRN: [id number]   DOB - 01/07/1946  Admit Date - 04/26/2013  Outpatient Primary MD for the patient is Renato Shin, MD   With History of -  Past Medical History  Diagnosis Date  . ANXIETY 07/23/2006  . ASTHMA 07/23/2006  . DM 05/23/2007  . HYPERTENSION 07/23/2006  . OSTEOARTHRITIS 07/23/2006  . HYPERCHOLESTEROLEMIA 05/23/2007  . ANEMIA 08/31/2008  . Palpitations 02/10/2008  . ALCOHOL ABUSE, IN REMISSION, HX OF 08/26/2008  . TOBACCO ABUSE, HX OF 08/26/2008  . Pulmonary embolism 03/16/2010  . ED (erectile dysfunction)   . Morbid obesity   . DM retinopathy   . Blindness of R eye     No vision due to childhood accident  . DM neuropathy with neurologic complication   . CHF (congestive heart failure)     With a preserved EF  . 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 Sleep apnea   . Shortness of breath   . CARCINOMA, PROSTATE 09/29/2009      Past Surgical History  Procedure Laterality Date  . Back surgery    . Tracheostomy      in for   Chief Complaint  Patient presents with  . Fall     HPI  Travis Chapman  is a 68 y.o. 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  He now presents to the hospital with chief complaints of generalized weakness, near fall at home, worsening pain in both knees, shortness of breath along with orthopnea, also has noticed worsening swelling in both lower extremities despite taking Lasix as instructed, in the ER workup consistent with acute renal failure, deconditioning, exam consistent with osteoarthritis of both knees, I was called to admit the patient   Patient denies any headache, no chest pain palpitations cough or phlegm, no abdominal pain, no diarrhea or dysuria, no focal weakness per se. No increased secretion from the trach site.    Review of Systems    In addition to the HPI above,   No Fever-chills, No Headache, No changes with Vision or hearing, No problems swallowing food or Liquids, No Chest pain, Cough but positive Shortness of Breath along with orthopnea, No Abdominal pain, No Nausea or Vommitting, Bowel movements are regular, No Blood in stool or  Urine, No dysuria, No new skin rashes or bruises, No new joints pains-aches, chronic bilateral knee pain,  No new weakness, tingling, numbness in any extremity, chronic generalized weakness No recent weight gain or loss, No polyuria, polydypsia or polyphagia, No significant Mental Stressors.  A full 10 point Review of Systems was done, except as stated above, all other Review of Systems were negative.   Social History History  Substance Use Topics  . Smoking status: Former Smoker -- 0.50 packs/day for 15 years    Types: Cigarettes    Quit date: 01/16/2005  . Smokeless tobacco: Not on file  . Alcohol Use: No      Family History Family History  Problem Relation Age of Onset  . Cancer Neg Hx       Prior to Admission medications   Medication Sig Start Date End Date Taking? Authorizing Provider  Alcohol Swabs (ALCOHOL PREP) 70 % PADS   11/25/12  Yes Historical Provider, MD  alprazolam Prudy Feeler) 2 MG tablet TAKE TWO TABLETS BY MOUTH AT BEDTIME 04/14/13  Yes Romero Belling, MD  amLODipine (NORVASC) 10 MG tablet Take 10 mg by mouth daily.   Yes Historical Provider, MD  Blood Glucose Monitoring Suppl (ONE TOUCH ULTRA 2) W/DEVICE KIT Use to check blood sugars 2/day. 04/10/13  Yes Romero Belling, MD  carvedilol (COREG) 6.25 MG tablet Take 6.25 mg by mouth 2 (two) times daily with a meal.   Yes Historical Provider, MD  citalopram (CELEXA) 40 MG tablet Take 1 tablet (40 mg total) by mouth daily. 11/18/12  Yes Romero Belling, MD  cloNIDine (CATAPRES) 0.1 MG tablet Take 0.1 mg by mouth daily. 02/12/13  Yes Romero Belling, MD  EASY COMFORT INSULIN SYRINGE 30G X 1/2" 1 ML MISC  11/25/12  Yes Historical Provider, MD  furosemide (LASIX) 40 MG tablet Take 40 mg by mouth daily.    Yes Historical Provider, MD  glucose blood (ONE TOUCH ULTRA TEST) test strip Use to check blood sugars 2/day. 04/14/13  Yes Romero Belling, MD  guaiFENesin (MUCINEX) 600 MG 12 hr tablet Take 1 tablet (600 mg total) by mouth 2 (two) times daily. 03/29/13  Yes Calvert Cantor, MD  insulin glargine (LANTUS) 100 UNIT/ML injection Inject 0.25 mLs (25 Units total) into the skin every morning. 01/13/13  Yes Romero Belling, MD  KLOR-CON M20 20 MEQ tablet TAKE TWO TABLETS BY MOUTH ONCE DAILY 04/07/13  Yes Romero Belling, MD  lactulose (CHRONULAC) 10 GM/15ML solution Take 30 mLs (20 g total) by mouth 2 (two) times daily. 11/06/12  Yes Romero Belling, MD  Lancets [facility] ULTRASOFT) lancets Use to check blood sugars 2/day. 04/10/13  Yes Romero Belling, MD  losartan (COZAAR) 100 MG tablet Take 100 mg by mouth daily.  03/10/13  Yes Historical Provider, MD  metoCLOPramide (REGLAN) 5 MG tablet Take 5 mg by mouth 4 (four) times daily -  before meals and at bedtime.    Yes Historical Provider, MD  metoCLOPramide (REGLAN) 5 MG tablet TAKE ONE TABLET BY MOUTH THREE TIMES DAILY 04/14/13  Yes Romero Belling, MD  Multiple  Vitamin (MULTIVITAMIN) tablet Take 1 tablet by mouth daily.    Yes Historical Provider, MD  oxyCODONE-acetaminophen (PERCOCET) 10-325 MG per tablet Take 1 tablet by mouth every 4 (four) hours as needed for pain. Please make available 05/05/13 04/07/13  Yes Romero Belling, MD  simvastatin (ZOCOR) 20 MG tablet TAKE ONE TABLET BY MOUTH ONCE DAILY 04/07/13  Yes Romero Belling, MD  warfarin (COUMADIN) 10 MG tablet Take  5-10 mg by mouth daily. Take 1 tablet ($RemoveB'10mg'hODKaWkc$ ) by mouth on Monday, Wednesday, and Friday.  Take 1/2 tablet ($RemoveBef'5mg'NzKnXHPBoK$ ) by mouth on tuesday, Thursday, Saturday, and sunday   Yes Historical Provider, MD    No Known Allergies  Physical Exam  Vitals  Blood pressure 156/74, pulse 93, temperature 98.3 F (36.8 C), temperature source Oral, resp. rate 18, SpO2 92.00%.   1. General morbidly obese African American male lying in bed in NAD,  blind in right eye, trach site stable  2. Normal affect and insight, Not Suicidal or Homicidal, Awake Alert, Oriented X 3.  3. No F.N deficits, ALL C.Nerves Intact, Strength 5/5 all 4 extremities, Sensation intact all 4 extremities, Plantars down going.  4. Ears and Eyes appear Normal, Conjunctivae clear, PERRLA. Moist Oral Mucosa.  5. Supple Neck, No JVD, No cervical lymphadenopathy appriciated, No Carotid Bruits.  6. Symmetrical Chest wall movement, Good air movement bilaterally, CTAB.  7. RRR, No Gallops, Rubs or Murmurs, No Parasternal Heave.  8. Positive Bowel Sounds, Abdomen Soft, Non tender, No organomegaly appriciated,No rebound -guarding or rigidity.  9.  No Cyanosis, Normal Skin Turgor, No Skin Rash or Bruise.  10. Good muscle tone,  joints appear normal , no effusions, Normal ROM. Bilateral knees have no effusion, no crepitus, no warmth, no redness no tenderness on passive range of motion. 1-2+ chronic edema with chronic skin changes in both legs.  11. No Palpable Lymph Nodes in Neck or Axillae     Data Review  CBC  Recent Labs Lab  04/26/13 1115  WBC 5.8  HGB 11.6*  HCT 35.1*  PLT 150  MCV 90.0  MCH 29.7  MCHC 33.0  RDW 13.5  LYMPHSABS 1.1  MONOABS 0.4  EOSABS 0.2  BASOSABS 0.0   ------------------------------------------------------------------------------------------------------------------  Chemistries   Recent Labs Lab 04/26/13 1115  NA 139  K 5.1  CL 101  CO2 24  GLUCOSE 116*  BUN 30*  CREATININE 2.00*  CALCIUM 9.2  AST 19  ALT 13  ALKPHOS 71  BILITOT 0.2*   ------------------------------------------------------------------------------------------------------------------ CrCl is unknown because both a height and weight (above a minimum accepted value) are required for this calculation. ------------------------------------------------------------------------------------------------------------------ No results found for this basename: TSH, T4TOTAL, FREET3, T3FREE, THYROIDAB,  in the last 72 hours   Coagulation profile No results found for this basename: INR, PROTIME,  in the last 168 hours ------------------------------------------------------------------------------------------------------------------- No results found for this basename: DDIMER,  in the last 72 hours -------------------------------------------------------------------------------------------------------------------  Cardiac Enzymes No results found for this basename: CK, CKMB, TROPONINI, MYOGLOBIN,  in the last 168 hours ------------------------------------------------------------------------------------------------------------------ No components found with this basename: POCBNP,    ---------------------------------------------------------------------------------------------------------------  Urinalysis    Component Value Date/Time   COLORURINE YELLOW 04/26/2013 1114   APPEARANCEUR CLEAR 04/26/2013 1114   LABSPEC 1.031* 04/26/2013 1114   PHURINE 5.5 04/26/2013 1114   GLUCOSEU NEGATIVE 04/26/2013 [place] 06/24/2012 1628   HGBUR NEGATIVE 04/26/2013 1114   BILIRUBINUR NEGATIVE 04/26/2013 1114   [place] 04/26/2013 1114   PROTEINUR >300* 04/26/2013 1114   UROBILINOGEN 0.2 04/26/2013 1114   NITRITE NEGATIVE 04/26/2013 [place] 04/26/2013 1114    ----------------------------------------------------------------------------------------------------------------  Imaging results:   Dg Chest Port 1 View  04/26/2013   CLINICAL DATA:  Weakness.  EXAM: PORTABLE CHEST - 1 VIEW  COMPARISON:  03/24/2013  FINDINGS: Stable large when of the cardiopericardial silhouette. No mediastinal or hilar masses or adenopathy.  Well-positioned tracheostomy tube.  Lungs are clear.  IMPRESSION: No acute cardiopulmonary disease.   Electronically Signed   By: Lajean Manes M.D.   On: 04/26/2013 11:54    EKG Ordered    Assessment & Plan   1. Acute renal failure in a patient with history of chronic kidney disease stage III baseline creatinine 1.5 - this likely is due to acute on chronic diastolic CHF decompensation, he does not appear dehydrated, will obtain urine electrolytes, hold ARB, initiate the patient on IV Lasix, repeat BMP in the morning.     2. Acute on chronic diastolic CHF. Will obtain baseline EKG, repeat echo gram as last echo from 2013, fluid and salt restriction, IV Lasix, continue Coreg add nitro paste and monitor.     3. Diabetes mellitus type 1. We'll check A1c, continue home dose Lantus, place on sliding scale insulin q. A.c.     4. Chronic respiratory failure due to obesity-related hypoventilation-to sleep apnea. Status post trach placement, he is on room air at the time, does not prefer history to be, no increased secretion from the trach site, nighttime ventilator dependent. Will be placed on ventilator at nighttime per home settings for restrictive sleep apnea.     5. Hypertension. Currently stable. Holding ACE inhibitor, continue Catapres, Coreg, Norvasc  add nitro paste and monitor.     6. Dyslipidemia home dose statin to be continued.     7. History of PE. On Coumadin pharmacy to monitor.     8. Chronic obesity-related osteoarthritis of both knees. Exam not consistent with any sort of active inflammation, no effusion, no redness, no erythema, have noted recent left knee x-ray obtained 3 weeks ago which appears unremarkable. There has been no acute change in his status. We'll continue to monitor. We'll have PT evaluate the patient, he uses walker to ambulate.     9. Generalized weakness difficulty in ambulation. This is more likely secondary to severe bilateral knee osteoarthritis, extreme obesity, generalized deconditioning, supportive care, PT evaluate, will require placement. Social work consulted.     10. 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 No acute issues.      DVT Prophylaxis Coumadin  AM Labs Ordered, also please review Full Orders  Family Communication: Admission, patients condition and plan of care including tests being ordered have been discussed with the patient and daughter who indicate understanding and agree with the plan and Code Status.  Code Status Full  Likely DC to SNF  Condition GUARDED     Time spent in minutes : 35    Thurnell Lose M.D on 04/26/2013 at 2:39 PM  Between 7am to 7pm - Pager - [phone]  After 7pm go to [email or web address] - password [email or web address]  And look for the night coverage person covering me after hours  [facility] Office  [phone]

## 2013-04-26 NOTE — Progress Notes (Signed)
Pt suctioned per request, very scant amount of thick white suctioned. Pt has strong cough. Family at bedside rolling eyes and not responding to RT.

## 2013-04-26 NOTE — ED Notes (Signed)
Per request of EDP [place] , With the assistance of 2 EMT 's and a walker , Pt attempted to stand. Pt unable to stand and states MY knees are going to cave in. Pt returned to bed with the assistance of 2 EMT's.

## 2013-04-26 NOTE — ED Provider Notes (Signed)
CSN: [id number]     Arrival date & time 04/26/13  6160 History   First MD Initiated Contact with Patient 04/26/13 1007     Chief Complaint  Patient presents with  . Fall     (Consider location/radiation/quality/duration/timing/severity/associated sxs/prior Treatment) HPI Comments: 68 year old male with chronic knee pain, as well as chronically trach dependent and morbid obesity, who presents today with worsening of his bilateral knee pain. He was admitted to the hospital last month after similar symptoms. He complains of generalized weakness as well. No fevers. No shortness of breath.  Patient is a 68 y.o. male presenting with extremity pain.  Extremity Pain This is a chronic problem. Episode onset: Worse yesterday. The problem occurs constantly. The problem has been rapidly worsening. Pertinent negatives include no chest pain, no abdominal pain and no shortness of breath. The symptoms are aggravated by bending and standing. Nothing relieves the symptoms. Treatments tried: Percocet. The treatment provided no relief.    Past Medical History  Diagnosis Date  . ANXIETY 07/23/2006  . ASTHMA 07/23/2006  . DM 05/23/2007  . HYPERTENSION 07/23/2006  . OSTEOARTHRITIS 07/23/2006  . HYPERCHOLESTEROLEMIA 05/23/2007  . ANEMIA 08/31/2008  . Palpitations 02/10/2008  . ALCOHOL ABUSE, IN REMISSION, HX OF 08/26/2008  . TOBACCO ABUSE, HX OF 08/26/2008  . Pulmonary embolism 03/16/2010  . ED (erectile dysfunction)   . Morbid obesity   . DM retinopathy   . 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 DM neuropathy with neurologic complication   . CHF (congestive heart failure)     With a preserved EF  . 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 Sleep apnea   . Shortness of breath   . CARCINOMA, PROSTATE 09/29/2009   Past Surgical History  Procedure Laterality Date  . Back surgery    . Tracheostomy     Family History  Problem Relation  Age of Onset  . Cancer Neg Hx    History  Substance Use Topics  . Smoking status: Former Smoker -- 0.50 packs/day for 15 years    Types: Cigarettes    Quit date: 01/16/2005  . Smokeless tobacco: Not on file  . Alcohol Use: No    Review of Systems  Constitutional: Negative for fever.  HENT: Negative for congestion.   Respiratory: Negative for cough and shortness of breath.   Cardiovascular: Negative for chest pain.  Gastrointestinal: Negative for nausea, vomiting, abdominal pain and diarrhea.  All other systems reviewed and are negative.     Allergies  Review of patient's allergies indicates no known allergies.  Home Medications   Current Outpatient Rx  Name  Route  Sig  Dispense  Refill  . Alcohol Swabs (ALCOHOL PREP) 70 % PADS               . alprazolam (XANAX) 2 MG tablet      TAKE TWO TABLETS BY MOUTH AT BEDTIME   60 tablet   0   . amLODipine (NORVASC) 10 MG tablet   Oral   Take 10 mg by mouth daily.         . Blood Glucose Monitoring Suppl (ONE TOUCH ULTRA 2) W/DEVICE KIT      Use to check blood sugars 2/day.   1 each   0   . carvedilol (COREG) 6.25 MG tablet   Oral   Take 6.25 mg by mouth 2 (two) times  daily with a meal.         . citalopram (CELEXA) 40 MG tablet   Oral   Take 1 tablet (40 mg total) by mouth daily.   30 tablet   4   . cloNIDine (CATAPRES) 0.1 MG tablet   Oral   Take 0.1 mg by mouth daily.         . EASY COMFORT INSULIN SYRINGE 30G X 1/2" 1 ML MISC               . furosemide (LASIX) 40 MG tablet   Oral   Take 40 mg by mouth daily.          . glucose blood (ONE TOUCH ULTRA TEST) test strip      Use to check blood sugars 2/day.   100 each   2   . guaiFENesin (MUCINEX) 600 MG 12 hr tablet   Oral   Take 1 tablet (600 mg total) by mouth 2 (two) times daily.   60 suppository   0   . insulin glargine (LANTUS) 100 UNIT/ML injection   Subcutaneous   Inject 0.25 mLs (25 Units total) into the skin every  morning.   10 mL   3   . KLOR-CON M20 20 MEQ tablet      TAKE TWO TABLETS BY MOUTH ONCE DAILY   60 tablet   0   . lactulose (CHRONULAC) 10 GM/15ML solution   Oral   Take 30 mLs (20 g total) by mouth 2 (two) times daily.   240 mL   11   . Lancets (ONETOUCH ULTRASOFT) lancets      Use to check blood sugars 2/day.   100 each   2   . losartan (COZAAR) 100 MG tablet   Oral   Take 100 mg by mouth daily.          . metoCLOPramide (REGLAN) 5 MG tablet   Oral   Take 5 mg by mouth 4 (four) times daily -  before meals and at bedtime.          . metoCLOPramide (REGLAN) 5 MG tablet      TAKE ONE TABLET BY MOUTH THREE TIMES DAILY   90 tablet   1   . Multiple Vitamin (MULTIVITAMIN) tablet   Oral   Take 1 tablet by mouth daily.          . oxyCODONE-acetaminophen (PERCOCET) 10-325 MG per tablet   Oral   Take 1 tablet by mouth every 4 (four) hours as needed for pain. Please make available 05/05/13   120 tablet   0   . simvastatin (ZOCOR) 20 MG tablet      TAKE ONE TABLET BY MOUTH ONCE DAILY   90 tablet   0   . warfarin (COUMADIN) 10 MG tablet   Oral   Take 5-10 mg by mouth daily. Take 1 tablet (10mg) by mouth on Monday, Wednesday, and Friday.  Take 1/2 tablet (5mg) by mouth on tuesday, Thursday, Saturday, and sunday          BP 143/118  Pulse 68  Temp(Src) 98.3 F (36.8 C) (Oral)  Resp 17  SpO2 93% Physical Exam  Nursing note and vitals reviewed. Constitutional: He is oriented to person, place, and time. He appears well-developed and well-nourished. No distress.  Obese  HENT:  Head: Normocephalic and atraumatic.  Mouth/Throat: Oropharynx is clear and moist.  Eyes: Conjunctivae are normal. Pupils are equal, round, and reactive to light. No scleral   icterus.  Neck: Neck supple.  Trach in place. Clean. Minimal secretions.  Cardiovascular: Normal rate, regular rhythm, normal heart sounds and intact distal pulses.   No murmur heard. Pulmonary/Chest: Breath  sounds normal. No stridor. Tachypnea noted. No respiratory distress. He has no wheezes. He has no rales.  Distant breath sounds. Slightly tachypneic, only able to speak in short sentences.  Abdominal: Soft. He exhibits no distension. There is no tenderness.  Musculoskeletal: Normal range of motion. He exhibits no edema.       Right knee: He exhibits normal range of motion (no erythema, no warmth), no swelling and no effusion. Tenderness found.       Left knee: He exhibits normal range of motion (no erythema, no warmth), no swelling and no effusion. Tenderness found.  Significant bilateral lower extremity swelling with mild pitting.  Chronic venous stasis skin changes.    Neurological: He is alert and oriented to person, place, and time.  Skin: Skin is warm and dry. No rash noted.  Psychiatric: He has a normal mood and affect. His behavior is normal.    ED Course  Procedures (including critical care time) Labs Review Labs Reviewed  CBG MONITORING, ED - Abnormal; Notable for the following:    Glucose-Capillary 114 (*)    All other components within normal limits   Imaging Review Dg Chest Port 1 View  04/26/2013   CLINICAL DATA:  Weakness.  EXAM: PORTABLE CHEST - 1 VIEW  COMPARISON:  03/24/2013  FINDINGS: Stable large when of the cardiopericardial silhouette. No mediastinal or hilar masses or adenopathy.  Well-positioned tracheostomy tube.  Lungs are clear.  IMPRESSION: No acute cardiopulmonary disease.   Electronically Signed   By: Lajean Manes M.D.   On: 04/26/2013 11:54  All radiology studies independently viewed by me.      EKG Interpretation None      MDM   Final diagnoses:  Acute renal failure Bilateral knee pain  68 year old male presenting with bilateral knee pain and generalized weakness. Has a history of chronic knee pain, and denies new injuries. No signs of septic arthritis. Given IV Dilaudid for pain control.  Lab tests consistent acute renal failure. Patient unable  to stand and ambulate. Appears very deconditioned. Internal medicine will admit for acute renal failure. Tolerating by mouth fluids.  Houston Siren III, MD 04/26/13 [phone]

## 2013-04-26 NOTE — ED Notes (Signed)
Pt  Using a urinal resp therapy at the bedside suctioning the pt

## 2013-04-26 NOTE — ED Notes (Signed)
RT @ bed side to suction PT.

## 2013-04-26 NOTE — Progress Notes (Signed)
Pt suctioned with sterile suction for thick tan secretions and large green mucous plug. RT will continue to monitor.

## 2013-04-26 NOTE — ED Notes (Signed)
Per EMS information. Pt missed chair when he sat down today and sat down on his buttocks. Pt now reports pain in legs with a reported HX of AR in knees.

## 2013-04-26 NOTE — ED Notes (Addendum)
Trach 7 x-tra long shily

## 2013-04-27 DIAGNOSIS — I319 Disease of pericardium, unspecified: Secondary | ICD-10-CM

## 2013-04-27 LAB — CBC
HEMATOCRIT: 33.9 % — AB (ref 39.0–52.0)
HEMOGLOBIN: 11.4 g/dL — AB (ref 13.0–17.0)
MCH: 29.2 pg (ref 26.0–34.0)
MCHC: 33.6 g/dL (ref 30.0–36.0)
MCV: 86.9 fL (ref 78.0–100.0)
Platelets: 157 10*3/uL (ref 150–400)
RBC: 3.9 MIL/uL — ABNORMAL LOW (ref 4.22–5.81)
RDW: 13.1 % (ref 11.5–15.5)
WBC: 5.4 10*3/uL (ref 4.0–10.5)

## 2013-04-27 LAB — BASIC METABOLIC PANEL
BUN: 28 mg/dL — ABNORMAL HIGH (ref 6–23)
CHLORIDE: 104 meq/L (ref 96–112)
CO2: 26 mEq/L (ref 19–32)
Calcium: 9.4 mg/dL (ref 8.4–10.5)
Creatinine, Ser: 1.68 mg/dL — ABNORMAL HIGH (ref 0.50–1.35)
GFR calc Af Amer: 47 mL/min — ABNORMAL LOW (ref >90)
GFR calc non Af Amer: 40 mL/min — ABNORMAL LOW (ref >90)
GLUCOSE: 147 mg/dL — AB (ref 70–99)
Potassium: 4.2 mEq/L (ref 3.7–5.3)
SODIUM: 142 meq/L (ref 137–147)

## 2013-04-27 LAB — GLUCOSE, CAPILLARY
GLUCOSE-CAPILLARY: 150 mg/dL — AB (ref 70–99)
Glucose-Capillary: 212 mg/dL — ABNORMAL HIGH (ref 70–99)
Glucose-Capillary: 145 mg/dL — ABNORMAL HIGH (ref 70–99)
Glucose-Capillary: 182 mg/dL — ABNORMAL HIGH (ref 70–99)

## 2013-04-27 LAB — PROTIME-INR
INR: 1.12 (ref 0.00–1.49)
Prothrombin Time: 14.2 seconds (ref 11.6–15.2)

## 2013-04-27 LAB — HEMOGLOBIN A1C
HEMOGLOBIN A1C: 6.6 % — AB (ref <5.7)
Mean Plasma Glucose: 143 mg/dL — ABNORMAL HIGH (ref <117)

## 2013-04-27 LAB — OSMOLALITY: Osmolality: 299 mOsm/kg (ref 275–300)

## 2013-04-27 MED ORDER — FUROSEMIDE 40 MG PO TABS
40.0000 mg | ORAL_TABLET | Freq: Two times a day (BID) | ORAL | Status: DC
  Administered 2013-04-28 (×2): 40 mg via ORAL
  Filled 2013-04-27 (×3): qty 1

## 2013-04-27 MED ORDER — WARFARIN SODIUM 10 MG PO TABS
10.0000 mg | ORAL_TABLET | Freq: Once | ORAL | Status: AC
  Administered 2013-04-27: 10 mg via ORAL
  Filled 2013-04-27: qty 1

## 2013-04-27 MED ORDER — FUROSEMIDE 10 MG/ML IJ SOLN
40.0000 mg | Freq: Two times a day (BID) | INTRAMUSCULAR | Status: AC
  Administered 2013-04-27 (×2): 40 mg via INTRAVENOUS
  Filled 2013-04-27: qty 4

## 2013-04-27 MED ORDER — ENOXAPARIN SODIUM 150 MG/ML ~~LOC~~ SOLN
1.0000 mg/kg | Freq: Two times a day (BID) | SUBCUTANEOUS | Status: DC
  Administered 2013-04-27 – 2013-04-30 (×7): 175 mg via SUBCUTANEOUS
  Filled 2013-04-27 (×12): qty 2

## 2013-04-27 NOTE — Evaluation (Signed)
Physical Therapy Evaluation Patient Details Name: Travis Chapman MRN: [id number] DOB: 1945/07/12 Today's Date: 04/27/2013   History of Present Illness     68 y.o. 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  He now presents to the hospital with chief complaints of generalized weakness, near fall at home, worsening pain in both knees, shortness of breath along with orthopnea, also has noticed worsening swelling in both lower extremities despite taking Lasix as instructed, in the ER workup consistent with acute renal failure, deconditioning, exam consistent with osteoarthritis of both knees, I was called to admit the patient     Clinical Impression  Pt with improved strength this eval including able to stand and pivot to [facility] with minimal assistance.  Expect if he continues to improve he will be able to return home with HHPT to follow.  Unclear if he is a surgical candidate for knee OA, may benefit from orthopedic c/s if has not pursued in the past.  PT will follow acutely.  Thank you.    Follow Up Recommendations Home health PT;Supervision/Assistance - 24 hour    Equipment Recommendations  None recommended by PT    Recommendations for Other Services       Precautions / Restrictions Precautions Precautions: Fall      Mobility  Bed  Mobility Overal bed mobility: Needs Assistance Bed Mobility: Supine to Sit;Sit to Supine     Supine to sit: Min assist;HOB elevated Sit to supine: Min guard   General bed mobility comments: pt moves quite well despite obesity and pain, able to achieve EOB with hand held assist and min assist to clear foot off bed; returns to supine unassisted and bed inverted to assist with +2 repositing up in bed  Transfers Overall transfer level: Needs assistance Equipment used: Rolling walker (2 wheeled) (bariatric RW) Transfers: Sit to/from [facility] Sit to Stand: +2 physical assistance (pt= at least 85%) Stand pivot transfers: +2 physical assistance (pt = at least 85%)       General transfer comment: utilized +2 assist for safety and physical assist as pt reportedly buckled legs in ED on admission, however this date pt able to move to/from [facility] with verbal cues and hands on for safety more than assistance.  needs incr time and hindered by lines but moves well  Ambulation/Gait                Stairs            Wheelchair Mobility    Modified Rankin (Stroke Patients Only)       Balance Overall balance assessment: History of Falls;No apparent balance deficits (not formally assessed)                                           Pertinent Vitals/Pain Moderate pain in bil  knees     Home Living Family/patient expects to be discharged to:: Private residence   Available Help at Discharge: Family;Available 24 hours/day Type of Home: House Home Access: Stairs to enter Entrance Stairs-Rails: Right;Left;Can reach both Entrance Stairs-Number of Steps: 3 Home Layout: One level Home Equipment: Walker - 2 wheels;Other (comment) Additional Comments: Pt wears vent at night time and has O2 just in case. Pt has been on vent for 8 years.  (per previous admission)    Prior Function Level of Independence: Independent with assistive device(s)                Hand Dominance   Dominant Hand: Right    Extremity/Trunk Assessment   Upper Extremity Assessment: Overall WFL for tasks assessed;Defer to OT evaluation           Lower Extremity Assessment: Overall WFL for tasks assessed (chronic arthritis limits; no buckling noted using RW)         Communication   Communication: Tracheostomy  Cognition Arousal/Alertness: Awake/alert Behavior During Therapy: WFL for tasks assessed/performed Overall Cognitive Status: Within Functional Limits for tasks assessed                      General Comments General comments (skin integrity, edema, etc.): morbidly obese; large BM, nursing present and aware    Exercises        Assessment/Plan    PT Assessment Patient needs continued PT services  PT Diagnosis Difficulty walking   PT Problem List Obesity;Cardiopulmonary status limiting activity;Decreased mobility;Decreased activity tolerance  PT Treatment Interventions Patient/family education;Therapeutic activities;Functional mobility training;Gait training;DME instruction   PT Goals (Current goals can be found in the Care Plan section) Acute Rehab PT Goals PT Goal Formulation: With patient Time For Goal Achievement: 05/11/13 Potential to Achieve Goals: Good    Frequency Min 3X/week   Barriers to discharge        Co-evaluation               End of Session Equipment Utilized During Treatment: Gait belt Activity Tolerance: Patient tolerated treatment well Patient left: in bed;with call bell/phone within reach;with nursing/sitter in room Nurse Communication: Mobility status         Time: 1550-1615 PT Time Calculation (min): 25 min   Charges:   PT Evaluation $Initial PT Evaluation Tier I: 1 Procedure PT Treatments $Therapeutic Activity: 8-22 mins   PT G Codes:          Herbie Drape 04/27/2013, 4:26 PM

## 2013-04-27 NOTE — Progress Notes (Signed)
Patient Demographics  Travis Chapman, is a 68 y.o. male, DOB - [birth year], YNW:[id number]  Admit date - 04/26/2013   Admitting Physician Thurnell Lose, MD  Outpatient Primary MD for the patient is Renato Shin, MD  LOS - 1   Chief Complaint  Patient presents with  . Fall        Assessment & Plan    1. Acute renal failure in a patient with history of chronic kidney disease stage III baseline creatinine 1.5 - this likely is due to acute on chronic diastolic CHF decompensation, he does not appear dehydrated, will obtain urine electrolytes, hold ARB, initiate the patient on IV Lasix, he has improved with diuresis and extra Lasix.     2. Acute on chronic diastolic CHF. Stable baseline EKG, mildly elevated QTC for which magnesium will be given, continue beta blocker, pending repeat echo gram , continue fluid and salt restriction, IV Lasix, continue Coreg add nitro paste and monitor. Much improved.     3. Diabetes mellitus type 1.   continue home dose Lantus, place on sliding scale insulin q. A.c.   Lab Results  Component Value Date   HGBA1C 7.3* 03/10/2013    CBG (last 3)   Recent Labs  04/26/13 1034 04/26/13 2057  GLUCAP 114* 212*      4. Chronic respiratory failure due to obesity-related hypoventilation-to sleep apnea. Status post trach placement, he is on room air at the time, does not prefer history to be, no increased secretion from the trach site, nighttime ventilator dependent. Will be placed on ventilator at nighttime per home settings for restrictive sleep apnea.     5. Hypertension. Currently stable. Holding ACE inhibitor, continue Catapres, Coreg, Norvasc add nitro paste and monitor.     6. Dyslipidemia home dose statin to be continued.     7. History of PE. On  Coumadin pharmacy to monitor we'll give him Lovenox overlap until Coumadin is therapeutic.  Lab Results  Component Value Date   INR 1.12 04/27/2013   INR 1.13 04/26/2013   INR 2.17* 03/29/2013     8. Chronic obesity-related osteoarthritis of both knees. Exam not consistent with any sort of active inflammation, no effusion, no redness, no erythema, have noted recent left knee x-ray obtained 3 weeks ago which appears unremarkable. There has been no acute change in his status. We'll continue to monitor. We'll have PT evaluate the patient, he uses walker to ambulate. Symptoms much improved on 04/27/2013 with just supportive care.    9. Generalized weakness difficulty in ambulation. This is more likely secondary to severe bilateral knee osteoarthritis, extreme obesity, generalized deconditioning, supportive care, PT evaluate, will require placement. Social work consulted.     10. 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 No acute issues.     Code Status: Full  Family Communication: Daughter  Disposition Plan: SNF if possible   Procedures echo gram   Consults   PT, social work   Medications  Scheduled Meds: . amLODipine  10 mg Oral Daily  . carvedilol  6.25 mg Oral BID WC  . citalopram  40 mg Oral Daily  . cloNIDine  0.1 mg Oral Daily  . furosemide  40 mg Intravenous BID  . [START ON 04/28/2013] furosemide  40 mg Oral BID  . guaiFENesin  600 mg Oral BID  . insulin aspart  0-5 Units Subcutaneous QHS  . insulin aspart  0-9 Units Subcutaneous TID WC  . insulin glargine  25 Units Subcutaneous q morning - 10a  . lactulose  20 g Oral BID  . metoCLOPramide  5 mg Oral TID AC & HS  . nitroGLYCERIN  1 inch Topical 4 times per day  . simvastatin  20 mg Oral q1800  . sodium chloride  3 mL Intravenous Q12H  . Warfarin - Pharmacist Dosing Inpatient   Does not apply q1800   Continuous Infusions:  PRN Meds:.alprazolam, guaiFENesin-dextromethorphan, HYDROcodone-acetaminophen, ondansetron (ZOFRAN)  IV, ondansetron, oxyCODONE, oxyCODONE-acetaminophen, polyethylene glycol  DVT Prophylaxis  Coumadin  Lab Results  Component Value Date   INR 1.12 04/27/2013   INR 1.13 04/26/2013   INR 2.17* 03/29/2013     Lab Results  Component Value Date   PLT 157 04/27/2013    Antibiotics     Anti-infectives   None          Subjective:   Travis Chapman today has, No headache, No chest pain, No abdominal pain - No Nausea, No new weakness tingling or numbness, No Cough - SOB.    Objective:   Filed Vitals:   04/27/13 0742 04/27/13 0745 04/27/13 0916 04/27/13 0953  BP:  136/69  143/65  Pulse: 83 73 76   Temp:  97.9 F (36.6 C)    TempSrc:  Oral    Resp: 14 15 16    Height:      Weight:      SpO2: 95%  94%     Wt Readings from Last 3 Encounters:  04/27/13 173.5 kg (382 lb 8 oz)  03/29/13 100.245 kg (221 lb)  02/19/13 167.831 kg (370 lb)     Intake/Output Summary (Last 24 hours) at 04/27/13 1016 Last data filed at 04/27/13 1000  Gross per 24 hour  Intake      3 ml  Output    900 ml  Net   -897 ml     Physical Exam  Awake Alert, Oriented X 3, No new F.N deficits, Normal affect [place].AT, chronic blindness in right eye, Supple Neck,No JVD, No cervical lymphadenopathy appriciated.  Symmetrical Chest wall movement, Good air movement bilaterally, few rales but improved from yesterday, trach site stable and clean RRR,No Gallops,Rubs or new Murmurs, No Parasternal Heave +ve B.Sounds, Abd Soft, Non tender, No organomegaly appriciated, No rebound - guarding or rigidity. No Cyanosis, Clubbing or edema, No new Rash or bruise, chronic 1-2+ leg edema   Data Review   Micro Results Recent Results (from the past 240 hour(s))  MRSA PCR SCREENING     Status: None   Collection Time    04/26/13  6:35 PM      Result Value Ref Range Status   MRSA by PCR NEGATIVE  NEGATIVE Final   Comment:            The GeneXpert MRSA Assay (FDA     approved for NASAL specimens     only), is one  component of a     comprehensive MRSA colonization     surveillance program. It is not     intended to diagnose MRSA     infection nor to guide or     monitor treatment for     MRSA infections.    Radiology Reports Dg Chest Port 1 View  04/26/2013   CLINICAL DATA:  Weakness.  EXAM: PORTABLE CHEST - 1 VIEW  COMPARISON:  03/24/2013  FINDINGS: Stable large when of the cardiopericardial silhouette. No mediastinal or hilar masses or adenopathy.  Well-positioned tracheostomy tube.  Lungs are clear.  IMPRESSION: No acute cardiopulmonary disease.   Electronically Signed   By: Lajean Manes M.D.   On: 04/26/2013 11:54    CBC  Recent Labs Lab 04/26/13 1115 04/27/13 0420  WBC 5.8 5.4  HGB 11.6* 11.4*  HCT 35.1* 33.9*  PLT 150 157  MCV 90.0 86.9  MCH 29.7 29.2  MCHC 33.0 33.6  RDW 13.5 13.1  LYMPHSABS 1.1  --   MONOABS 0.4  --   EOSABS 0.2  --   BASOSABS 0.0  --     Chemistries   Recent Labs Lab 04/26/13 1115 04/27/13 0420  NA 139 142  K 5.1 4.2  CL 101 104  CO2 24 26  GLUCOSE 116* 147*  BUN 30* 28*  CREATININE 2.00* 1.68*  CALCIUM 9.2 9.4  AST 19  --   ALT 13  --   ALKPHOS 71  --   BILITOT 0.2*  --    ------------------------------------------------------------------------------------------------------------------ estimated creatinine clearance is 72.5 ml/min (by C-G formula based on Cr of 1.68). ------------------------------------------------------------------------------------------------------------------ No results found for this basename: HGBA1C,  in the last 72 hours ------------------------------------------------------------------------------------------------------------------ No results found for this basename: CHOL, HDL, LDLCALC, TRIG, CHOLHDL, LDLDIRECT,  in the last 72 hours ------------------------------------------------------------------------------------------------------------------ No results found for this basename: TSH, T4TOTAL, FREET3, T3FREE,  THYROIDAB,  in the last 72 hours ------------------------------------------------------------------------------------------------------------------ No results found for this basename: VITAMINB12, FOLATE, FERRITIN, TIBC, IRON, RETICCTPCT,  in the last 72 hours  Coagulation profile  Recent Labs Lab 04/26/13 1115 04/27/13 0420  INR 1.13 1.12    No results found for this basename: DDIMER,  in the last 72 hours  Cardiac Enzymes No results found for this basename: CK, CKMB, TROPONINI, MYOGLOBIN,  in the last 168 hours ------------------------------------------------------------------------------------------------------------------ No components found with this basename: POCBNP,      Time Spent in minutes   35   Thurnell Lose M.D on 04/27/2013 at 10:16 AM  Between 7am to 7pm - Pager - [phone]  After 7pm go to [email or web address] - password [email or web address]  And look for the night coverage person covering for me after hours  [facility] Office  [phone]

## 2013-04-27 NOTE — Progress Notes (Signed)
ANTICOAGULATION CONSULT NOTE - Initial Consult  Pharmacy Consult for Lovenox/Coumadin  Indication: H/o PE   No Known Allergies  Patient Measurements: Height: 6\' 3"  (190.5 cm) Weight: 382 lb 8 oz (173.5 kg) IBW/kg (Calculated) : 84.5 Heparin Dosing Weight: n/a  Vital Signs: Temp: 97.9 F (36.6 C) (04/12 0745) Temp src: Oral (04/12 0745) BP: 143/65 mmHg (04/12 0953) Pulse Rate: 76 (04/12 0916)  Labs:  Recent Labs  04/26/13 1115 04/27/13 0420  HGB 11.6* 11.4*  HCT 35.1* 33.9*  PLT 150 157  LABPROT 14.3 14.2  INR 1.13 1.12  CREATININE 2.00* 1.68*    Estimated Creatinine Clearance: 72.5 ml/min (by C-G formula based on Cr of 1.68).   Assessment: 18 YOM with h/o of PE presents with CC of generalized weakness, SOB and worsening swelling in both lower extremities. Pharmacy to start lovenox due to subtherapeutic INR and continue home coumadin. INR remains subtherapeutic and slightly decreased after 10mg  yesterday.  Per patient and daughter, patient has not missed doses of medication. H/H and plt stable.  No noted bleeding.    Home coumadin dose: 10 mg on MWF, 5 mg on all other days   Goal of Therapy:  INR 2-3 Monitor platelets by anticoagulation protocol: Yes   Plan:  - Start lovenox 175mg  (1mg /kg) Q12H until INR >2 - Coumadin 10 mg x 1 dose tonight - F/u daily INR  - Monitor for s/s of bleeding   Thank you, Vivia Ewing, PharmD Clinical Pharmacist - Resident Pager: [phone] Pharmacy: [phone] 04/27/2013 11:05 AM

## 2013-04-27 NOTE — Progress Notes (Signed)
  Echocardiogram 2D Echocardiogram has been performed.  Carney Corners 04/27/2013, 10:33 AM

## 2013-04-27 NOTE — Progress Notes (Signed)
PT off vent, on RA sitting up eating breakfast when RT arrived for am check. Pt states he no longer uses speaking valve, just places finger over trach to communicate. Pt states he "blew so many valves out, I just stopped buying them". Pt says no suction needed at this time, and all vitals are within normal limits at this time. RT made sure cuff was deflated, pt eating breakfast, and no obvious distress was noted at this time. RT will continue to monitor.

## 2013-04-28 LAB — CBC
HCT: 35.3 % — ABNORMAL LOW (ref 39.0–52.0)
Hemoglobin: 11.7 g/dL — ABNORMAL LOW (ref 13.0–17.0)
MCH: 29.1 pg (ref 26.0–34.0)
MCHC: 33.1 g/dL (ref 30.0–36.0)
MCV: 87.8 fL (ref 78.0–100.0)
Platelets: 173 K/uL (ref 150–400)
RBC: 4.02 MIL/uL — ABNORMAL LOW (ref 4.22–5.81)
RDW: 13 % (ref 11.5–15.5)
WBC: 6.2 K/uL (ref 4.0–10.5)

## 2013-04-28 LAB — GLUCOSE, CAPILLARY
GLUCOSE-CAPILLARY: 147 mg/dL — AB (ref 70–99)
GLUCOSE-CAPILLARY: 194 mg/dL — AB (ref 70–99)
GLUCOSE-CAPILLARY: 170 mg/dL — AB (ref 70–99)
GLUCOSE-CAPILLARY: 171 mg/dL — AB (ref 70–99)
Glucose-Capillary: 130 mg/dL — ABNORMAL HIGH (ref 70–99)

## 2013-04-28 LAB — URINE CULTURE: Colony Count: 70000

## 2013-04-28 LAB — PROTIME-INR
INR: 1.2 (ref 0.00–1.49)
Prothrombin Time: 14.9 s (ref 11.6–15.2)

## 2013-04-28 MED ORDER — WARFARIN SODIUM 10 MG PO TABS
10.0000 mg | ORAL_TABLET | Freq: Once | ORAL | Status: AC
  Administered 2013-04-28: 10 mg via ORAL
  Filled 2013-04-28: qty 1

## 2013-04-28 MED ORDER — CITALOPRAM HYDROBROMIDE 20 MG PO TABS
20.0000 mg | ORAL_TABLET | Freq: Every day | ORAL | Status: DC
  Administered 2013-04-29 – 2013-04-30 (×2): 20 mg via ORAL
  Filled 2013-04-28 (×2): qty 1

## 2013-04-28 MED ORDER — FUROSEMIDE 10 MG/ML IJ SOLN
40.0000 mg | Freq: Two times a day (BID) | INTRAMUSCULAR | Status: DC
  Administered 2013-04-29: 40 mg via INTRAVENOUS
  Filled 2013-04-28 (×2): qty 4

## 2013-04-28 NOTE — Telephone Encounter (Signed)
Pt is in the hospital now

## 2013-04-28 NOTE — Telephone Encounter (Signed)
Please read call-a-nurse note below.

## 2013-04-28 NOTE — Care Management Note (Signed)
  Page 1 of 1   04/28/2013     10:28:50 AM   CARE MANAGEMENT NOTE 04/28/2013  Patient:  Travis Chapman, Travis Chapman   Account Number:  [id number]  Date Initiated:  04/28/2013  Documentation initiated by:  Maryjayne Kleven  Subjective/Objective Assessment:   dx ARF/diastolic failure; lives with mother- brother and dtr assist as needed; chronic trach, nocturnal vent through [employer], active with [facility] for RN/PT    PCP  Dr Renato Shin     DC Planning Services  CM consult      Per UR Regulation:  Reviewed for med. necessity/level of care/duration of stay

## 2013-04-28 NOTE — Progress Notes (Signed)
ANTICOAGULATION CONSULT NOTE - Initial Consult  Pharmacy Consult for Lovenox/Coumadin  Indication: H/o PE   No Known Allergies Patient Measurements: Height: 6\' 3"  (190.5 cm) Weight: 382 lb 8 oz (173.5 kg) IBW/kg (Calculated) : 84.5 Vital Signs: Temp: 98.2 F (36.8 C) (04/13 1132) Temp src: Oral (04/13 1132) BP: 126/113 mmHg (04/13 1132) Pulse Rate: 77 (04/13 1214) Labs:  Recent Labs  04/26/13 1115 04/27/13 0420 04/28/13 0334  HGB 11.6* 11.4* 11.7*  HCT 35.1* 33.9* 35.3*  PLT 150 157 173  LABPROT 14.3 14.2 14.9  INR 1.13 1.12 1.20  CREATININE 2.00* 1.68*  --    Estimated Creatinine Clearance: 72.5 ml/min (by C-G formula based on Cr of 1.68).  Assessment: 2 YOM with h/o of PE presents with CC of generalized weakness, SOB and worsening swelling in both lower extremities. Pharmacy to start lovenox due to subtherapeutic INR and continue home coumadin. INR remains subtherapeutic but has risen to 1.20 after 2 doses of 10mg .  Per patient and daughter, patient has not missed doses of medication. H/H and plt stable.  No noted bleeding.    Home coumadin dose: 10 mg on MWF, 5 mg on all other days   Goal of Therapy:  INR 2-3 Monitor platelets by anticoagulation protocol: Yes   Plan:  - Continue lovenox 175mg  (1mg /kg) Q12H until INR >2 - Coumadin 10 mg x 1 dose tonight -- if still low tomorrow consider increase to 12.5mg  - F/u daily INR  - Monitor for s/s of bleeding   Thank you, Sloan Leiter, PharmD, BCPS Clinical Pharmacist [phone]  04/28/2013 1:46 PM

## 2013-04-28 NOTE — Progress Notes (Signed)
[facility] TEAM 1 - Stepdown/ICU TEAM Progress Note  Abdirahman Chittum JIR:[id number] DOB: [birth year] DOA: 04/26/2013 PCP: Renato Shin, MD  Admit HPI / Brief Narrative: 68 y.o. 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  At that time placement was initiated however patient was not able to be placed due to his tracheostomy.  He presented to the hospital with a chief complaint of generalized weakness, near fall at home, worsening pain in both knees, shortness of breath along with orthopnea, and worsening swelling in both lower extremities despite taking Lasix as instructed.  In the ER workup consistent with acute renal failure, deconditioning, exam consistent with osteoarthritis of both knees.  HPI/Subjective: Pt is feeling better today in regard to his breathing.  He does c/o worsening of B knee pain.  Denies cp, n/v, or abdom pain.    Assessment/Plan:  Acute renal failure with history of chronic kidney disease stage III baseline creatinine 1.5 - likely is due to acute on chronic diastolic CHF decompensation - hold ARB - crt has been improving - recheck in AM   Acute on chronic diastolic CHF Negative 3.5 L since admit - wgt recorded at 170kg 03/24/2013 - pt currently at 173kg - attempt to gently diurese further as able   Diabetes mellitus type 1 w/ numerous complications B5Z 6.6 - CBG reasonably controlled - no change in tx plan at this time   Chronic respiratory failure due to  obesity-related hypoventilation/sleep apnea Status post trach placement - nighttime ventilator dependent - appears to be stable   HTN BP stable   HLD Cont zocor  History of PE INR not at goal - Pharmacy dosing warfarin - consider change to Xarelto (will discuss w/ pt prior to d/c)  Chronic obesity-related osteoarthritis of both knees uses walker to ambulate - PT suggests HHPT but with 24hr assistance - unclear if pt will have this at home - he would likely thrive best in a SNF setting - investigate further for possible d/c next 24-48hrs  Generalized weakness / difficulty in ambulation secondary to severe bilateral knee osteoarthritis, extreme obesity, generalized deconditioning - see above discussion   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 rate 19, height 6\' 3"  (1.905 m), weight 173.5 kg (382 lb 8 oz), SpO2 95.00%.  Intake/Output Summary (Last 24 hours) at 04/28/13 1119 Last data filed at 04/28/13 1000  Gross per 24 hour  Intake    350 ml  Output   2050 ml  Net  -1700 ml   Exam: General: No acute respiratory distress at rest in chair Lungs: very distant bs th/o all fields - mild basilar crackles - no wheeze  Cardiovascular: very distant hear sounds - regular rate and rhythm without murmur gallop or rub normal S1 and S2 Abdomen: Nontender, nondistended, soft, bowel sounds positive, no rebound, no ascites, no appreciable mass Extremities: No significant cyanosis, clubbing, or edema bilateral lower extremities  Data Reviewed: Basic Metabolic Panel:  Recent Labs Lab  04/26/13 1115 04/27/13 0420  NA 139 142  K 5.1 4.2  CL 101 104  CO2 24 26  GLUCOSE 116* 147*  BUN 30* 28*  CREATININE 2.00* 1.68*  CALCIUM 9.2 9.4   Liver Function Tests:  Recent Labs Lab 04/26/13 1115  AST 19  ALT 13  ALKPHOS 71  BILITOT 0.2*  PROT 7.2  ALBUMIN 3.3*   CBC:  Recent Labs Lab 04/26/13 1115 04/27/13 0420 04/28/13 0334  WBC 5.8 5.4 6.2  NEUTROABS 4.1  --   --   HGB 11.6* 11.4* 11.7*  HCT 35.1* 33.9* 35.3*  MCV 90.0 86.9 87.8  PLT 150 157 173   BNP (last 3 results)  Recent Labs  11/24/12 1250 11/26/12 0600 03/10/13 1630  PROBNP 264.4* 596.6* 58.0   CBG:  Recent Labs Lab 04/26/13 2057 04/27/13 0746 04/27/13 1207 04/27/13 1735 04/28/13 0734  GLUCAP 212* 145* 182* 150* 130*    Recent Results (from the past 240 hour(s))  MRSA PCR SCREENING     Status: None   Collection Time    04/26/13  6:35 PM      Result Value Ref Range Status   MRSA by PCR NEGATIVE  NEGATIVE Final   Comment:            The GeneXpert MRSA Assay (FDA     approved for NASAL specimens     only), is one component of a     comprehensive MRSA colonization     surveillance program. It is not     intended to diagnose MRSA     infection nor to guide or     monitor treatment for     MRSA infections.  URINE CULTURE     Status: None   Collection Time    04/26/13  7:58 PM      Result Value Ref Range Status   Specimen Description URINE, RANDOM   Final   Special Requests NONE   Final   Culture  Setup Time     Final   Value: 04/27/2013 02:13     Performed at [facility]     Final   Value: 70,000 COLONIES/ML     Performed at [facility]   Culture     Final   Value: Multiple bacterial morphotypes present, none predominant. Suggest appropriate recollection if clinically indicated.     Performed at [facility]   Report Status 04/28/2013 FINAL   Final     Studies:  Recent x-ray studies have been reviewed in detail by the  Attending Physician  Scheduled Meds:  Scheduled Meds: . amLODipine  10 mg Oral Daily  . carvedilol  6.25 mg Oral BID WC  . citalopram  40 mg Oral Daily  . cloNIDine  0.1 mg Oral Daily  . enoxaparin (LOVENOX) injection  1 mg/kg Subcutaneous Q12H  . furosemide  40 mg Oral BID  . guaiFENesin  600 mg Oral BID  . insulin aspart  0-5 Units Subcutaneous QHS  . insulin aspart  0-9 Units Subcutaneous TID WC  . insulin glargine  25 Units Subcutaneous q morning - 10a  . lactulose  20 g  Oral BID  . metoCLOPramide  5 mg Oral TID AC & HS  . nitroGLYCERIN  1 inch Topical 4 times per day  . simvastatin  20 mg Oral q1800  . sodium chloride  3 mL Intravenous Q12H  . Warfarin - Pharmacist Dosing Inpatient   Does not apply q1800    Time spent on care of this patient: 35 mins   Cherene Altes, MD  [facility] Office  [phone] Pager - Text Page per Amion as per below:  On-Call/Text Page:      [email or web address]      password [email or web address]  If 7PM-7AM, please contact night-coverage [email or web address] Password [email or web address] 04/28/2013, 11:19 AM   LOS: 2 days

## 2013-04-28 NOTE — Progress Notes (Signed)
Physical Therapy Treatment Patient Details Name: Travis Chapman MRN: [id number] DOB: [birth year] Today's Date: 05-01-13    History of Present Illness Pt admit with bil LE weakness, CP and PE.  Long term trach since 2006.      PT Comments    Pt progressing with gait able to ambulate to door today with chair to follow. Pt with assist for pericare prior to gait. Pt encouraged to continue HEP and OOB for meals. Pt reported knee pain limiting gait distance. Will continue to follow.   Follow Up Recommendations  Home health PT;Supervision/Assistance - 24 hour     Equipment Recommendations       Recommendations for Other Services       Precautions / Restrictions Precautions Precautions: Fall    Mobility  Bed Mobility               General bed mobility comments: Pt EOB on arrival  Transfers Overall transfer level: Needs assistance   Transfers: Sit to/from Stand Sit to Stand: Min assist         General transfer comment: cues for anterior translation and assist to achieve with momentum x 2 trials from bed  Ambulation/Gait Ambulation/Gait assistance: Min assist;+2 safety/equipment Ambulation Distance (Feet): 15 Feet Assistive device: Rolling walker (2 wheeled) Gait Pattern/deviations: Step-through pattern;Decreased stride length;Wide base of support   Gait velocity interpretation: Below normal speed for age/gender General Gait Details: chair pulled behind pt with fatigue limiting distance   Stairs            Wheelchair Mobility    Modified Rankin (Stroke Patients Only)       Balance                                    Cognition Arousal/Alertness: Awake/alert Behavior During Therapy: WFL for tasks assessed/performed Overall Cognitive Status: Within Functional Limits for tasks assessed                      Exercises General Exercises - Lower Extremity Long Arc Quad: AROM;Seated;Both;15 reps Hip Flexion/Marching:  AROM;Seated;Both;15 reps    General Comments        Pertinent Vitals/Pain No pain at rest HR 87 sats 93% on RA    Home Living                      Prior Function            PT Goals (current goals can now be found in the care plan section) Progress towards PT goals: Progressing toward goals    Frequency       PT Plan Current plan remains appropriate    Co-evaluation             End of Session Equipment Utilized During Treatment: Gait belt Activity Tolerance: Patient tolerated treatment well Patient left: in chair;with call bell/phone within reach;with nursing/sitter in room     Time: 0839-0905 PT Time Calculation (min): 26 min  Charges:  $Gait Training: 8-22 mins $Therapeutic Exercise: 8-22 mins                    G Codes:      Taila Basinski B Kalijah Zeiss 05-01-13, 12:17 PM Elwyn Reach, [place]

## 2013-04-28 NOTE — Telephone Encounter (Signed)
error 

## 2013-04-29 DIAGNOSIS — E1149 Type 2 diabetes mellitus with other diabetic neurological complication: Secondary | ICD-10-CM

## 2013-04-29 DIAGNOSIS — M25569 Pain in unspecified knee: Secondary | ICD-10-CM

## 2013-04-29 DIAGNOSIS — I509 Heart failure, unspecified: Secondary | ICD-10-CM

## 2013-04-29 DIAGNOSIS — E1143 Type 2 diabetes mellitus with diabetic autonomic (poly)neuropathy: Secondary | ICD-10-CM | POA: Diagnosis present

## 2013-04-29 DIAGNOSIS — I2699 Other pulmonary embolism without acute cor pulmonale: Secondary | ICD-10-CM

## 2013-04-29 DIAGNOSIS — G4733 Obstructive sleep apnea (adult) (pediatric): Secondary | ICD-10-CM

## 2013-04-29 DIAGNOSIS — K3184 Gastroparesis: Secondary | ICD-10-CM

## 2013-04-29 DIAGNOSIS — J9620 Acute and chronic respiratory failure, unspecified whether with hypoxia or hypercapnia: Secondary | ICD-10-CM

## 2013-04-29 DIAGNOSIS — IMO0002 Reserved for concepts with insufficient information to code with codable children: Secondary | ICD-10-CM

## 2013-04-29 DIAGNOSIS — M1710 Unilateral primary osteoarthritis, unspecified knee: Secondary | ICD-10-CM

## 2013-04-29 DIAGNOSIS — I5033 Acute on chronic diastolic (congestive) heart failure: Secondary | ICD-10-CM

## 2013-04-29 LAB — BASIC METABOLIC PANEL
BUN: 26 mg/dL — ABNORMAL HIGH (ref 6–23)
CO2: 27 meq/L (ref 19–32)
Calcium: 9 mg/dL (ref 8.4–10.5)
Chloride: 98 mEq/L (ref 96–112)
Creatinine, Ser: 1.58 mg/dL — ABNORMAL HIGH (ref 0.50–1.35)
GFR calc Af Amer: 51 mL/min — ABNORMAL LOW (ref >90)
GFR calc non Af Amer: 44 mL/min — ABNORMAL LOW (ref >90)
Glucose, Bld: 132 mg/dL — ABNORMAL HIGH (ref 70–99)
Potassium: 3.8 mEq/L (ref 3.7–5.3)
SODIUM: 140 meq/L (ref 137–147)

## 2013-04-29 LAB — GLUCOSE, CAPILLARY
GLUCOSE-CAPILLARY: 130 mg/dL — AB (ref 70–99)
GLUCOSE-CAPILLARY: 161 mg/dL — AB (ref 70–99)
GLUCOSE-CAPILLARY: 145 mg/dL — AB (ref 70–99)
Glucose-Capillary: 199 mg/dL — ABNORMAL HIGH (ref 70–99)

## 2013-04-29 LAB — PROTIME-INR
INR: 1.5 — ABNORMAL HIGH (ref 0.00–1.49)
Prothrombin Time: 17.7 seconds — ABNORMAL HIGH (ref 11.6–15.2)

## 2013-04-29 MED ORDER — WARFARIN SODIUM 10 MG PO TABS
10.0000 mg | ORAL_TABLET | Freq: Once | ORAL | Status: AC
  Administered 2013-04-29: 10 mg via ORAL
  Filled 2013-04-29: qty 1

## 2013-04-29 MED ORDER — FUROSEMIDE 10 MG/ML IJ SOLN
60.0000 mg | Freq: Two times a day (BID) | INTRAMUSCULAR | Status: DC
  Administered 2013-04-29: 60 mg via INTRAVENOUS
  Filled 2013-04-29 (×4): qty 6

## 2013-04-29 NOTE — Progress Notes (Signed)
ANTICOAGULATION CONSULT NOTE - Initial Consult  Pharmacy Consult for Lovenox/Coumadin  Indication: H/o PE   No Known Allergies Patient Measurements: Height: 6\' 3"  (190.5 cm) Weight: 381 lb 6.3 oz (173 kg) IBW/kg (Calculated) : 84.5 Vital Signs: Temp: 98.7 F (37.1 C) (04/14 0724) Temp src: Oral (04/14 0724) BP: 130/71 mmHg (04/14 0724) Pulse Rate: 68 (04/14 0905) Labs:  Recent Labs  04/26/13 1115 04/27/13 0420 04/28/13 0334 04/29/13 0242  HGB 11.6* 11.4* 11.7*  --   HCT 35.1* 33.9* 35.3*  --   PLT 150 157 173  --   LABPROT 14.3 14.2 14.9 17.7*  INR 1.13 1.12 1.20 1.50*  CREATININE 2.00* 1.68*  --  1.58*   Estimated Creatinine Clearance: 76.9 ml/min (by C-G formula based on Cr of 1.58).  Assessment: 20 YOM with h/o of PE presents with chief complaint of generalized weakness, SOB and worsening swelling in both lower extremities. Pharmacy to start lovenox due to subtherapeutic INR and continue home coumadin. INR remains subtherapeutic but has risen to 1.5 after 3 doses of 10 mg.  Per patient and daughter, patient has not missed doses of medication. H/H and plt stable.  No noted bleeding.    Home coumadin dose: 10 mg on MWF, 5 mg on all other days   Goal of Therapy:  INR 2-3 Monitor platelets by anticoagulation protocol: Yes   Plan:  - Continue lovenox 175mg  until INR >2 - Coumadin 10 mg x 1 - F/u daily INR  - Monitor for s/sx of bleeding    Hughes Better, PharmD, BCPS Clinical Pharmacist Pager: [phone] 04/29/2013 11:05 AM

## 2013-04-29 NOTE — Progress Notes (Signed)
Clinical Social Work Department BRIEF PSYCHOSOCIAL ASSESSMENT 04/29/2013  Patient:  Travis Chapman, Travis Chapman     Account Number:  [id number]     Admit date:  04/26/2013  Clinical Social Worker:  Freeman Caldron  Date/Time:  04/29/2013 03:41 PM  Referred by:  Physician  Date Referred:  04/29/2013 Referred for  SNF Placement   Other Referral:   Interview type:  Patient Other interview type:    PSYCHOSOCIAL DATA Living Status:  FAMILY Admitted from facility:   Level of care:   Primary support name:  Aryav Wimberly [phone]) Primary support relationship to patient:  CHILD, ADULT Degree of support available:   Good--pt states he lives with his elderly mother.    CURRENT CONCERNS Current Concerns  Post-Acute Placement   Other Concerns:    SOCIAL WORK ASSESSMENT / PLAN CSW consulted for SNF placement. PT recommending home health; paged PT and spoke with them about possibility/appropriateness for SNF. PT states if pt is agreeable SNF could be explored as he requires vent support at night. Spoke with pt about this, and pt states he wants to return home with his mother.   Assessment/plan status:  No Further Intervention Required Other assessment/ plan:   Information/referral to community resources:   N/A--pt declined SNF, will return home with home health    PATIENT'S/FAMILY'S RESPONSE TO PLAN OF CARE: Good--pt friendly, engaged in conversation with CSW, understanding of CSW role. Declined SNF.       Ky Barban, MSW, [facility] Clinical Social Worker [phone]

## 2013-04-29 NOTE — Progress Notes (Signed)
ICU/STEP DOWN [facility] PROGRESS NOTE  Travis Chapman HQI:[id number] DOB: February 14, 1945 DOA: 04/26/2013 PCP: Renato Shin, MD       Principal Problem:   Acute renal failure Active Problems:   CARCINOMA, PROSTATE   HYPERCHOLESTEROLEMIA   HYPERTENSION   CARDIOMYOPATHY   OSTEOARTHRITIS   TOBACCO ABUSE, HX OF   Pulmonary embolism   Weakness   NSVT (nonsustained ventricular tachycardia)   OSA (obstructive sleep apnea)   Tracheostomy dependent   Obesities, morbid   Diastolic CHF, acute on chronic   Type I (juvenile type) diabetes mellitus with renal manifestations, not stated as uncontrolled(250.41)   Ventilator dependence   Difficulty walking   Obesity hypoventilation syndrome   ARF (acute renal failure)   Diabetic gastroparesis      VITAL SIGNS:  Temp: 37.1 Pulse Rate: 70 Resp: 14 BP: 1:30/71 SpO2: 92% on room air FiO2 (%):   Ventilator settings  Mode  Rate  Tidal Volume  FiO2  PO2/ FIO2  PIP  Plateau      Assessment/Plan: Neuro   Resp 1. Obesity hypoventilation syndrome -Stable now on his home regimen of room air during the day and Ventilator at night..  -If patient stable overnight discharge in a.m.  2. pulmonary embolism -4/14 Continue Coumadin per pharmacy; INR= 1.5  [facility]  1. Diastolic CHF -Admit weight= 174.9 kg (bed), 4/14 weight= 173 kg (bed) -Increase Lasix IV to 60 mg BID -Closely monitor electrolytes -Ambulatory SpO2 -May be able to discharge in a.m. -Patient counseled on his daily requirements to stay stable as a CHF patient. This included daily a.m. weights and Lasix adjustments per cardiology  2.HTN -Well controlled on current regimen -Continue Norvasc 10 mg daily, Coreg 6.25 mg BID, Catapres 0.1 mg daily   GI 1. DIABETIC GASTROPARESIS -Continue Reglan 5 mg QAC/QHS  Renal balance today; + 172ml    /overall;   -3751ml     Creatinine ;  1.58      Hourly output,    1. ARF with CJD stage III -Creatinine at baseline,  continue to monitor  Endocrine 1. Diabetes mellitus type 1 uncontrolled -4/12 2015 hemoglobin A1c= 6.6 -Continue Lantus 25 units QAm, NovoLog 9 units with meals, and sensitive SSI  Extremeties  1. Severe right knee osteoarthritis -Per right knee x-ray 03/24/2013 tricompartmental OA with bone-on-bone at the medial compartment -On discharge patient may benefit from orthopedic surgery consult  Heme/labs  ID    Code Status: Full Family Communication: None Disposition Plan: DC in a.m. with home health, PT    Devices   LINES / TUBES:  Tracheostomy tube      Consultants:    Procedures/SIGNIFICANT EVENTS: Echocardiogram 04/27/2013 - Left ventricle: The cavity size was mildly dilated. Mild LVH.  -LVEF= 50% to 55%.  - (grade 1 diastolic dysfunction). - small pericardial effusion was identified.    PCXR 04/26/2013 No acute cardiopulmonary disease.     CULTURES:      Antibiotics:    Continuous Infusions:     HPI/Subjective: 68 y.o. 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 At that time placement was initiated however  patient was not able to be placed due to his tracheostomy.  He presented to the hospital with a chief complaint of generalized weakness, near fall at home, worsening pain in both knees, shortness of breath along with orthopnea, and worsening swelling in both lower extremities despite taking Lasix as instructed. In the ER workup consistent with acute renal failure, deconditioning, exam consistent  with osteoarthritis of both knees. 4/14 states does not know his baseline weight, does not know his cardiologist name. States feels breathing is much improved from admission.    Exam:   General:  A./O. x4, NAD  Cardiovascular: Regular rate, negative murmurs rubs gallops,  Respiratory: Clear to auscultation bilateral  Abdomen: Morbidly obese, nontender, positive bowel sound  Musculoskeletal: 3-2+ pitting edema to knees bilateral, bilateral venous stasis ulcers healed      Data Reviewed: Basic Metabolic Panel:  Recent Labs Lab 04/26/13 1115 04/27/13 0420 04/29/13 0242  NA 139 142 140  K 5.1 4.2 3.8  CL 101 104 98  CO2 24 26 27   GLUCOSE 116* 147* 132*  BUN 30* 28* 26*  CREATININE 2.00* 1.68* 1.58*  CALCIUM 9.2 9.4 9.0   Liver Function Tests:  Recent Labs Lab 04/26/13 1115  AST 19  ALT 13  ALKPHOS 71  BILITOT 0.2*  PROT 7.2  ALBUMIN 3.3*   No results found for this basename: LIPASE, AMYLASE,  in the last 168 hours No results found for this basename: AMMONIA,  in the last 168 hours CBC:  Recent Labs Lab 04/26/13 1115 04/27/13 0420 04/28/13 0334  WBC 5.8 5.4 6.2  NEUTROABS 4.1  --   --   HGB 11.6* 11.4* 11.7*  HCT 35.1* 33.9* 35.3*  MCV 90.0 86.9 87.8  PLT 150 157 173   Cardiac Enzymes: No results found for this basename: CKTOTAL, CKMB, CKMBINDEX, TROPONINI,  in the last 168 hours BNP (last 3 results)  Recent Labs  11/24/12 1250 11/26/12 0600 03/10/13 1630  PROBNP 264.4* 596.6* 58.0   CBG:  Recent Labs Lab 04/28/13 1135 04/28/13 1704 04/28/13 2127 04/29/13 0727 04/29/13 1143  GLUCAP 194* 170* 171* 130* 161*    Recent Results (from the past 240 hour(s))  MRSA PCR SCREENING     Status: None   Collection Time    04/26/13  6:35 PM      Result Value Ref Range Status   MRSA by PCR NEGATIVE  NEGATIVE Final   Comment:            The GeneXpert MRSA Assay (FDA     approved for NASAL specimens     only), is one component of a      comprehensive MRSA colonization     surveillance program. It is not     intended to diagnose MRSA     infection nor to guide or     monitor treatment for     MRSA infections.  URINE CULTURE     Status: None   Collection Time    04/26/13  7:58 PM      Result Value Ref Range Status   Specimen Description URINE, RANDOM   Final   Special Requests NONE   Final   Culture  Setup Time     Final   Value: 04/27/2013 02:13     Performed at [place]     Final   Value: 70,000 COLONIES/ML     Performed at [place]     Final  Value: Multiple bacterial morphotypes present, none predominant. Suggest appropriate recollection if clinically indicated.     Performed at [facility]   Report Status 04/28/2013 FINAL   Final     No results found.  Scheduled Meds: . amLODipine  10 mg Oral Daily  . carvedilol  6.25 mg Oral BID WC  . citalopram  20 mg Oral Daily  . cloNIDine  0.1 mg Oral Daily  . enoxaparin (LOVENOX) injection  1 mg/kg Subcutaneous Q12H  . furosemide  60 mg Intravenous BID  . guaiFENesin  600 mg Oral BID  . insulin aspart  0-5 Units Subcutaneous QHS  . insulin aspart  0-9 Units Subcutaneous TID WC  . insulin glargine  25 Units Subcutaneous q morning - 10a  . lactulose  20 g Oral BID  . metoCLOPramide  5 mg Oral TID AC & HS  . simvastatin  20 mg Oral q1800  . sodium chloride  3 mL Intravenous Q12H  . warfarin  10 mg Oral ONCE-1800  . Warfarin - Pharmacist Dosing Inpatient   Does not apply q1800        Time spent: 40 minutes   [facility] Pager [phone]. If 7PM-7AM, please contact night-coverage at [email or web address], password [facility] 04/29/2013, 12:36 PM  LOS: 3 days

## 2013-04-29 NOTE — Progress Notes (Signed)
Patient refused IV lasix and Lactulose.  Patient stated he had his lasix earlier today and doesn't want to be using the bathroom all night long.  Explained to patient the purpose of both medication and pt verbalized understanding and still wished to hold off on meds.

## 2013-04-30 LAB — BASIC METABOLIC PANEL
BUN: 26 mg/dL — AB (ref 6–23)
CO2: 27 meq/L (ref 19–32)
Calcium: 8.9 mg/dL (ref 8.4–10.5)
Chloride: 95 mEq/L — ABNORMAL LOW (ref 96–112)
Creatinine, Ser: 1.6 mg/dL — ABNORMAL HIGH (ref 0.50–1.35)
GFR calc Af Amer: 50 mL/min — ABNORMAL LOW (ref >90)
GFR, EST NON AFRICAN AMERICAN: 43 mL/min — AB (ref >90)
GLUCOSE: 122 mg/dL — AB (ref 70–99)
Potassium: 3.4 mEq/L — ABNORMAL LOW (ref 3.7–5.3)
SODIUM: 138 meq/L (ref 137–147)

## 2013-04-30 LAB — PROTIME-INR
INR: 1.55 — ABNORMAL HIGH (ref 0.00–1.49)
Prothrombin Time: 18.2 seconds — ABNORMAL HIGH (ref 11.6–15.2)

## 2013-04-30 LAB — GLUCOSE, CAPILLARY
GLUCOSE-CAPILLARY: 122 mg/dL — AB (ref 70–99)
GLUCOSE-CAPILLARY: 184 mg/dL — AB (ref 70–99)

## 2013-04-30 LAB — MAGNESIUM: Magnesium: 2 mg/dL (ref 1.5–2.5)

## 2013-04-30 LAB — HEPARIN ANTI-XA: Heparin LMW: 1.99 IU/mL

## 2013-04-30 MED ORDER — WARFARIN SODIUM 10 MG PO TABS: 10.0000 mg | ORAL_TABLET | Freq: Every day | ORAL | Status: DC

## 2013-04-30 MED ORDER — ENOXAPARIN SODIUM 100 MG/ML ~~LOC~~ SOLN: 100.0000 mg | Freq: Two times a day (BID) | SUBCUTANEOUS | Status: DC

## 2013-04-30 MED ORDER — CITALOPRAM HYDROBROMIDE 20 MG PO TABS: 20.0000 mg | ORAL_TABLET | Freq: Every day | ORAL | Status: DC

## 2013-04-30 MED ORDER — ENOXAPARIN SODIUM 100 MG/ML ~~LOC~~ SOLN
100.0000 mg | Freq: Two times a day (BID) | SUBCUTANEOUS | Status: DC
  Filled 2013-04-30: qty 1

## 2013-04-30 MED ORDER — FUROSEMIDE 20 MG PO TABS: 60.0000 mg | ORAL_TABLET | Freq: Two times a day (BID) | ORAL | Status: DC

## 2013-04-30 MED ORDER — ENOXAPARIN SODIUM 80 MG/0.8ML ~~LOC~~ SOLN: 75.0000 mg | Freq: Two times a day (BID) | SUBCUTANEOUS | Status: DC

## 2013-04-30 MED ORDER — WARFARIN SODIUM 2.5 MG PO TABS
12.5000 mg | ORAL_TABLET | Freq: Once | ORAL | Status: AC
  Administered 2013-04-30: 12.5 mg via ORAL
  Filled 2013-04-30: qty 1

## 2013-04-30 NOTE — Discharge Summary (Addendum)
DISCHARGE SUMMARY  Travis Chapman  MR#: [id number]  DOB:May 06, 1945  Date of Admission: 04/26/2013 Date of Discharge: 04/30/2013  Attending Physician:Alano Blasco Hennie Duos, MD   Patient's AQT:[id number], Hilliard Clark, MD  Consults: None  Disposition: D/C home   Follow-up Appts:     Follow-up Information   Follow up with Renato Shin, MD. Schedule an appointment as soon as possible for a visit in 1 week.   Specialty:  Endocrinology   Contact information:   [address] [place] [address] [phone]       Follow up with [facility]. Schedule an appointment as soon as possible for a visit in 1 day.   Contact information:   [address] 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 M w/ history of morbid obesity, obesity-related hypoventilation syndrome, obstructive sleep apnea requiring tracheostomy nighttime ventilator dependent (does not use oxygen during daytime), diabetes mellitus type 1, hypertension, dyslipidemia, chronic kidney disease stage III baseline creatinine 1.5, diabetic retinopathy, blind in the right eye since his childhood, chronic osteoarthritis in both knees, chronic diastolic CHF last EF of about 50% in 2013, PE on Coumadin, who was admitted ~1 month ago for generalized weakness bilateral knee pain left more than  right. At that time placement in a rehab facility was initiated however patient was not able to be placed due to his tracheostomy.  He presented to the hospital with a chief complaint of generalized weakness, near fall at home, worsening pain in both knees, shortness of breath along with orthopnea, and worsening swelling in both lower extremities despite taking Lasix as instructed. In the ER workup was consistent with acute renal failure, deconditioning, and exam was consistent with osteoarthritis of both knees.  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  H7C 6.6 - CBG reasonably controlled - no change in tx plan during hospital stay    Chronic respiratory failure due to obesity-related hypoventilation/sleep apnea  Status post trach placement - nighttime ventilator dependent - stable   HTN  BP stable   HLD  Cont zocor   History of PE  INR not at goal at time of admit, nor at time of d/c - Pharmacy dosed warfarin - pt not interested in newer agents at this time, but is willing to discuss w/ his PCP - attempt to arrange for home lovenox overlap w/ continued warfarin - is followed as outpt at  [facility] - pt to f/u there in 24-48hrs - pt assures MD he can arrange for this follow-up  Chronic obesity-related osteoarthritis of both knees  uses walker to ambulate - PT suggests HHPT but with 24hr assistance - he would likely thrive best in a SNF setting - pt is not interested in SNF however and has chosen to d/c home instead   Generalized weakness / difficulty in  ambulation  secondary to severe bilateral knee osteoarthritis, extreme obesity, generalized deconditioning - see above discussion   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     carvedilol 6.25 MG tablet  Commonly known as:  COREG  Take 6.25 mg by mouth 2 (two) times daily with a meal.     citalopram 20 MG tablet  Commonly known as:  CELEXA  Take 1 tablet (20 mg total) by mouth daily.     cloNIDine 0.1 MG tablet  Commonly known as:  CATAPRES  Take 0.1 mg by mouth daily.     EASY COMFORT INSULIN SYRINGE 30G X 1/2" 1 ML Misc  Generic drug:  Insulin Syringe-Needle U-100     enoxaparin 100 MG/ML injection  Commonly known as:  LOVENOX  Inject 1 mL (100 mg total) into the skin every 12 (twelve) hours.     furosemide 20 MG tablet  Commonly known as:  LASIX  Take 3 tablets (60 mg total) by mouth 2 (two) times daily.     glucose blood test strip  Commonly known as:  ONE TOUCH ULTRA TEST  Use to check blood sugars 2/day.     guaiFENesin 600 MG 12 hr tablet  Commonly known as:  MUCINEX  Take 1 tablet (600 mg total) by mouth 2 (two) times daily.     insulin glargine 100 UNIT/ML injection  Commonly known as:  LANTUS  Inject 0.25 mLs (25 Units total) into the skin every morning.     KLOR-CON M20 20 MEQ tablet  Generic drug:  potassium chloride SA  TAKE TWO TABLETS BY MOUTH ONCE DAILY     lactulose 10 GM/15ML solution  Commonly known as:  CHRONULAC  Take 30 mLs (20 g total) by mouth 2 (two) times daily.     metoCLOPramide 5 MG tablet  Commonly known as:  REGLAN  TAKE ONE TABLET BY MOUTH THREE TIMES DAILY     multivitamin tablet  Take 1 tablet by mouth daily.     ONE  TOUCH ULTRA 2 W/DEVICE Kit  Use to check blood sugars 2/day.     onetouch ultrasoft lancets  Use to check blood sugars 2/day.     oxyCODONE-acetaminophen 10-325 MG per tablet  Commonly known as:  PERCOCET  Take 1 tablet by mouth every 4 (four) hours as needed for pain. Please make available 05/05/13     simvastatin 20 MG tablet  Commonly known as:  ZOCOR  TAKE ONE TABLET BY MOUTH ONCE DAILY     warfarin 10 MG tablet  Commonly known as:  COUMADIN  Take 1 tablet (10  mg total) by mouth daily at 6 PM.       Day of Discharge BP 102/50  Pulse 75  Temp(Src) 97.4 F (36.3 C) (Oral)  Resp 23  Ht _0  (1.905 m)  Wt 173.2 kg (381 lb 13.4 oz)  BMI 47.73 kg/m2  SpO2 100%  Physical Exam: General: No acute respiratory distress Lungs: Clear to auscultation bilaterally without wheezes or crackles - bs very distnat  Cardiovascular: Regular rate and rhythm with very distant heart sounds  Abdomen: morbidly obese, nontender, nondistended, soft, bowel sounds positive, no rebound, no ascites, no appreciable mass Extremities: No significant cyanosis, or clubbing; 1+edema bilateral lower extremities  PROTIME-INR     Status: Abnormal   Collection Time    04/30/13  3:19 AM      Result Value Ref Range   Prothrombin Time 18.2 (*) 11.6 - 15.2 seconds   INR 1.55 (*) 0.00 - 8.06  BASIC METABOLIC PANEL     Status: Abnormal   Collection Time    04/30/13  3:19 AM      Result Value Ref Range   Sodium 138  137 - 147 mEq/L   Potassium 3.4 (*) 3.7 - 5.3 mEq/L   Chloride 95 (*) 96 - 112 mEq/L   CO2 27  19 - 32 mEq/L   Glucose, Bld 122 (*) 70 - 99 mg/dL   BUN 26 (*) 6 - 23 mg/dL   Creatinine, Ser 1.60 (*) 0.50 - 1.35 mg/dL   Calcium 8.9  8.4 - 10.5 mg/dL   GFR calc non Af Amer 43 (*) >90 mL/min   GFR calc Af Amer 50 (*) >90 mL/min  MAGNESIUM     Status: None   Collection Time    04/30/13  3:19 AM      Result Value Ref Range   Magnesium 2.0  1.5 - 2.5 mg/dL    Time spent in discharge (includes  decision making & examination of pt): >35 minutes  04/30/2013, 5:55 PM   Cherene Altes, MD [facility] Office  [phone] Pager [phone]  On-Call/Text Page:      [email or web address]      password [facility]

## 2013-04-30 NOTE — Progress Notes (Addendum)
ANTICOAGULATION CONSULT NOTE - Follow Up Consult  Pharmacy Consult for warfarin, lovenox Indication: hx PE  No Known Allergies  Patient Measurements: Height: 6\' 3"  (190.5 cm) Weight: 381 lb 13.4 oz (173.2 kg) IBW/kg (Calculated) : 84.5 Heparin Dosing Weight:   Vital Signs: Temp: 98 F (36.7 C) (04/15 0721) Temp src: Oral (04/15 0721) BP: 136/96 mmHg (04/15 0905) Pulse Rate: 78 (04/15 0804)  Labs:  Recent Labs  04/28/13 0334 04/29/13 0242 04/30/13 0319  HGB 11.7*  --   --   HCT 35.3*  --   --   PLT 173  --   --   LABPROT 14.9 17.7* 18.2*  INR 1.20 1.50* 1.55*  CREATININE  --  1.58* 1.60*    Estimated Creatinine Clearance: 76 ml/min (by C-G formula based on Cr of 1.6).  Assessment: 68 yom presented to the ED with generalized weakness and near fall. Pt is on chronic coumadin for hx PE. However, INR was essentially normal at admission and remains low at 1.55. He is also on lovenox 1mg /kg Q12H until INR is therapeutic. Pt is obese and lovenox dosing hasn't been studied as extensively in this group of patients.   Goal of Therapy:  INR 2-3 Anti-Xa level 0.6-1 units/ml 4hrs after LMWH dose given Monitor platelets by anticoagulation protocol: Yes   Plan:  1. Warfarin 12.5mg  PO x 1 tonight 2. F/u AM INR 3. Check an anti-xa 4 hours post-lovenox dose today  Rande Lawman Rumbarger 04/30/2013,9:22 AM  Anti-xa level drawn today = 1.99.  This level is higher than goal of 0.6 - 1.  Dose was given 2 hours late last night, so not exactly steady-state. Plan: decrease LMWH to 100 mg sq q12h. I have called RN, Everlene Other and texted Dr Thereasa Solo to change discharge prescription as he is ready to dc home now. Eudelia Bunch, Pharm.D. [phone] 04/30/2013 5:50 PM

## 2013-04-30 NOTE — Progress Notes (Signed)
Pt discharged per w/c with all belongings went home with rest of trach supplies that were in his room and all discharge instructions. Son in law aware that he needs to get prescriptions filled for pt to have tonight. Given Coumadin prior to leaving.

## 2013-04-30 NOTE — Progress Notes (Signed)
Pt had refused his IV Lasix and lactulose this am. Md came and talked with pt. Pt pressing to go home. Went over all D/C instructions. Pt aware he is to give himself Lovenox injections in his abdomen. Says he can do it because he is used to giving himself insulin injections.

## 2013-04-30 NOTE — Progress Notes (Signed)
Physical Therapy Treatment Patient Details Name: Travis Chapman MRN: [id number] DOB: [birth year] Today's Date: 05/05/2013    History of Present Illness Pt admit with bil LE weakness, CP and PE.  Long term trach since 2006.      PT Comments    Pt admitted with above. Pt currently with functional limitations due to balance and endurance deficits.  Pt will benefit from skilled PT to increase their independence and safety with mobility to allow discharge to the venue listed below.   Follow Up Recommendations  Home health PT;Supervision/Assistance - 24 hour     Equipment Recommendations  None recommended by PT    Recommendations for Other Services       Precautions / Restrictions Precautions Precautions: Fall Restrictions Weight Bearing Restrictions: No    Mobility  Bed Mobility                  Transfers Overall transfer level: Needs assistance Equipment used: Rolling walker (2 wheeled) (bariatric) Transfers: Sit to/from Stand Sit to Stand: Min assist;From elevated surface         General transfer comment: cues for anterior translation and assist to achieve with momentum x 2 trials from chair having to raise the bari chair into elevation so pt can perform sit to stand.   Ambulation/Gait Ambulation/Gait assistance: Min guard;+2 safety/equipment Ambulation Distance (Feet): 30 Feet Assistive device: Rolling walker (2 wheeled) Gait Pattern/deviations: Step-through pattern;Decreased stride length;Wide base of support   Gait velocity interpretation: Below normal speed for age/gender General Gait Details: chair pulled behind pt with fatigue limiting distance   Stairs            Wheelchair Mobility    Modified Rankin (Stroke Patients Only)       Balance Overall balance assessment: History of Falls;Needs assistance         Standing balance support: Bilateral upper extremity supported;During functional activity Standing balance-Leahy Scale:  Poor Standing balance comment: Relies on RW                    Cognition Arousal/Alertness: Awake/alert Behavior During Therapy: WFL for tasks assessed/performed Overall Cognitive Status: Within Functional Limits for tasks assessed                      Exercises General Exercises - Lower Extremity Long Arc Quad: AROM;Seated;Both;15 reps Hip Flexion/Marching: AROM;Seated;Both;15 reps    General Comments        Pertinent Vitals/Pain VSS, no pain    Home Living                      Prior Function            PT Goals (current goals can now be found in the care plan section) Progress towards PT goals: Progressing toward goals    Frequency  Min 3X/week    PT Plan Current plan remains appropriate    Co-evaluation             End of Session Equipment Utilized During Treatment: Gait belt (trach on RA) Activity Tolerance: Patient tolerated treatment well Patient left: in chair;with call bell/phone within reach;with nursing/sitter in room     Time: 0905-0919 PT Time Calculation (min): 14 min  Charges:  $Gait Training: 8-22 mins                    G Codes:      Freddy Kinne [place] May 05, 2013, 9:30 AM [facility] [phone] (pager)

## 2013-04-30 NOTE — Progress Notes (Signed)
Trach CK done. Pt in no distress at this time. Pt on RA sats 96%.

## 2013-04-30 NOTE — Progress Notes (Signed)
Pharmacy called readjusted Lovenox dose for pt - call to MD and changed Discharge Lovenox to 100 mg. New paperwork and discharge instructions printed and went over with pt.  Pt will follow up with Coumadin clinic for lab draw Friday am.

## 2013-04-30 NOTE — Discharge Instructions (Signed)
IT IS VERY IMPORTANT THAT YOU HAVE YOUR INR CHECKED AT THE COUMADIN CLINIC ON THRUSDAY, April 16   Arthralgia Your caregiver has diagnosed you as suffering from an arthralgia. Arthralgia means there is pain in a joint. This can come from many reasons including:  Bruising the joint which causes soreness (inflammation) in the joint.  Wear and tear on the joints which occur as we grow older (osteoarthritis).  Overusing the joint.  Various forms of arthritis.  Infections of the joint. Regardless of the cause of pain in your joint, most of these different pains respond to anti-inflammatory drugs and rest. The exception to this is when a joint is infected, and these cases are treated with antibiotics, if it is a bacterial infection. HOME CARE INSTRUCTIONS   Rest the injured area for as long as directed by your caregiver. Then slowly start using the joint as directed by your caregiver and as the pain allows. Crutches as directed may be useful if the ankles, knees or hips are involved. If the knee was splinted or casted, continue use and care as directed. If an stretchy or elastic wrapping bandage has been applied today, it should be removed and re-applied every 3 to 4 hours. It should not be applied tightly, but firmly enough to keep swelling down. Watch toes and feet for swelling, bluish discoloration, coldness, numbness or excessive pain. If any of these problems (symptoms) occur, remove the ace bandage and re-apply more loosely. If these symptoms persist, contact your caregiver or return to this location.  For the first 24 hours, keep the injured extremity elevated on pillows while lying down.  Apply ice for 15-20 minutes to the sore joint every couple hours while awake for the first half day. Then 03-04 times per day for the first 48 hours. Put the ice in a plastic bag and place a towel between the bag of ice and your skin.  Wear any splinting, casting, elastic bandage applications, or slings as  instructed.  Only take over-the-counter or prescription medicines for pain, discomfort, or fever as directed by your caregiver. Do not use aspirin immediately after the injury unless instructed by your physician. Aspirin can cause increased bleeding and bruising of the tissues.  If you were given crutches, continue to use them as instructed and do not resume weight bearing on the sore joint until instructed. Persistent pain and inability to use the sore joint as directed for more than 2 to 3 days are warning signs indicating that you should see a caregiver for a follow-up visit as soon as possible. Initially, a hairline fracture (break in bone) may not be evident on X-rays. Persistent pain and swelling indicate that further evaluation, non-weight bearing or use of the joint (use of crutches or slings as instructed), or further X-rays are indicated. X-rays may sometimes not show a small fracture until a week or 10 days later. Make a follow-up appointment with your own caregiver or one to whom we have referred you. A radiologist (specialist in reading X-rays) may read your X-rays. Make sure you know how you are to obtain your X-ray results. Do not assume everything is normal if you do not hear from [place]. SEEK MEDICAL CARE IF: Bruising, swelling, or pain increases. SEEK IMMEDIATE MEDICAL CARE IF:   Your fingers or toes are numb or blue.  The pain is not responding to medications and continues to stay the same or get worse.  The pain in your joint becomes severe.  You develop a  fever over 102 F (38.9 C).  It becomes impossible to move or use the joint. MAKE SURE YOU:   Understand these instructions.  Will watch your condition.  Will get help right away if you are not doing well or get worse. Document Released: 01/02/2005 Document Revised: 03/27/2011 Document Reviewed: 08/21/2007 [facility] Patient Information 2014 [place].

## 2013-05-01 ENCOUNTER — Telehealth: Payer: Self-pay | Admitting: Endocrinology

## 2013-05-01 NOTE — Telephone Encounter (Signed)
Please decline this request.  If he needs DM education, he can do this when he returns here.  i would be happy to make an appointment for him.

## 2013-05-01 NOTE — Telephone Encounter (Signed)
See below,  [place] for verbal?  Thanks!

## 2013-05-01 NOTE — Telephone Encounter (Signed)
Need resumption of care orders for  1 wk 1 2 wk 3 1 wk 1 For diabetic teaching

## 2013-05-02 ENCOUNTER — Telehealth: Payer: Self-pay | Admitting: General Practice

## 2013-05-02 ENCOUNTER — Ambulatory Visit (INDEPENDENT_AMBULATORY_CARE_PROVIDER_SITE_OTHER): Payer: Medicare Other | Admitting: General Practice

## 2013-05-02 DIAGNOSIS — Z5181 Encounter for therapeutic drug level monitoring: Secondary | ICD-10-CM

## 2013-05-02 DIAGNOSIS — I2699 Other pulmonary embolism without acute cor pulmonale: Secondary | ICD-10-CM

## 2013-05-02 LAB — POCT INR: INR: 2.2

## 2013-05-02 NOTE — Telephone Encounter (Signed)
Scott with [facility] notified.

## 2013-05-02 NOTE — Telephone Encounter (Signed)
Faxed orders to [facility] to check patient's INR on 4/22 and call results to Villa Herb, RN @ [phone]

## 2013-05-02 NOTE — Telephone Encounter (Signed)
Requested call back to discuss.  

## 2013-05-12 ENCOUNTER — Encounter: Payer: Self-pay | Admitting: Endocrinology

## 2013-05-12 ENCOUNTER — Other Ambulatory Visit: Payer: Self-pay | Admitting: Endocrinology

## 2013-05-12 ENCOUNTER — Ambulatory Visit (INDEPENDENT_AMBULATORY_CARE_PROVIDER_SITE_OTHER): Payer: Medicare Other | Admitting: Endocrinology

## 2013-05-12 VITALS — BP 122/86 | HR 69 | Temp 98.8°F

## 2013-05-12 DIAGNOSIS — N179 Acute kidney failure, unspecified: Secondary | ICD-10-CM

## 2013-05-12 MED ORDER — ALPRAZOLAM 2 MG PO TABS: ORAL_TABLET | ORAL | Status: DC

## 2013-05-12 NOTE — Patient Instructions (Addendum)
blood tests are being requested for you today.  We'll contact you with results. check your blood sugar twice a day.  vary the time of day when you check, between before the 3 meals, and at bedtime.  also check if you have symptoms of your blood sugar being too high or too low.  please keep a record of the readings and bring it to your next appointment here.  You can write it on any piece of paper.  please call us sooner if your blood sugar goes below 70, or if you have a lot of readings over 200. Please come back for a follow-up appointment in 1 month. Please reduce the insulin to 20 units daily.   It is OK to resume the xanax.  Here is a prescription.

## 2013-05-12 NOTE — Progress Notes (Signed)
Subjective:    Patient ID: Travis Chapman, male    DOB: Oct 31, 1945, 68 y.o.   MRN: [id number]  HPI Pt returns for f/u of insulin-requiring DM (dx'ed 1992; he has mild if any neuropathy of the lower extremities, but he has associated nephropathy; he needs a simple regimen, as he was unwilling to take multiple daily injections).  CHF: since hospitalization, sob is improved. Renal failure: edema is unchanged. Insomnia: xanax was d/c'ed in the hospital.  Past Medical History  Diagnosis Date  . ANXIETY 07/23/2006  . ASTHMA 07/23/2006  . DM 05/23/2007  . HYPERTENSION 07/23/2006  . OSTEOARTHRITIS 07/23/2006  . HYPERCHOLESTEROLEMIA 05/23/2007  . ANEMIA 08/31/2008  . Palpitations 02/10/2008  . ALCOHOL ABUSE, IN REMISSION, HX OF 08/26/2008  . TOBACCO ABUSE, HX OF 08/26/2008  . Pulmonary embolism 03/16/2010  . ED (erectile dysfunction)   . Morbid obesity   . DM retinopathy   . 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 DM neuropathy with neurologic complication   . CHF (congestive heart failure)     With a preserved EF  . 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 Sleep apnea   . Shortness of breath   . CARCINOMA, PROSTATE 09/29/2009    Past Surgical History  Procedure Laterality Date  . Back surgery    . Tracheostomy      History   Social History  . Marital Status: Divorced    Spouse Name: N/A    Number of Children: N/A  . Years of Education: N/A   Occupational History  . Disabled    Social History Main Topics  . Smoking status: Former Smoker -- 0.50 packs/day for 15 years    Types: Cigarettes    Quit date: 01/16/2005  . Smokeless tobacco: Not on file  . Alcohol Use: No  . Drug Use: Not on file  . Sexual Activity: Not on file   Other Topics Concern  . Not on file   Social History Narrative   Single   Lives with his elderly mother          Current Outpatient Prescriptions on File Prior to Visit    Medication Sig Dispense Refill  . Alcohol Swabs (ALCOHOL PREP) 70 % PADS       . amLODipine (NORVASC) 10 MG tablet Take 10 mg by mouth daily.      . Blood Glucose Monitoring Suppl (ONE TOUCH ULTRA 2) W/DEVICE KIT Use to check blood sugars 2/day.  1 each  0  . carvedilol (COREG) 6.25 MG tablet Take 6.25 mg by mouth 2 (two) times daily with a meal.      . citalopram (CELEXA) 20 MG tablet Take 1 tablet (20 mg total) by mouth daily.  30 tablet  4  . cloNIDine (CATAPRES) 0.1 MG tablet Take 0.1 mg by mouth daily.      Marland Kitchen EASY COMFORT INSULIN SYRINGE 30G X 1/2" 1 ML MISC       . enoxaparin (LOVENOX) 100 MG/ML injection Inject 1 mL (100 mg total) into the skin every 12 (twelve) hours.  10 Syringe  0  . furosemide (LASIX) 20 MG tablet Take 3 tablets (60 mg total) by mouth 2 (two) times daily.  180 tablet  0  . glucose blood (ONE TOUCH ULTRA TEST) test strip Use to check blood sugars 2/day.  100 each  2  . guaiFENesin (MUCINEX) 600  MG 12 hr tablet Take 1 tablet (600 mg total) by mouth 2 (two) times daily.  60 suppository  0  . lactulose (CHRONULAC) 10 GM/15ML solution Take 30 mLs (20 g total) by mouth 2 (two) times daily.  240 mL  11  . Lancets (ONETOUCH ULTRASOFT) lancets Use to check blood sugars 2/day.  100 each  2  . metoCLOPramide (REGLAN) 5 MG tablet TAKE ONE TABLET BY MOUTH THREE TIMES DAILY  90 tablet  1  . Multiple Vitamin (MULTIVITAMIN) tablet Take 1 tablet by mouth daily.       Marland Kitchen oxyCODONE-acetaminophen (PERCOCET) 10-325 MG per tablet Take 1 tablet by mouth every 4 (four) hours as needed for pain. Please make available 05/05/13  120 tablet  0  . simvastatin (ZOCOR) 20 MG tablet TAKE ONE TABLET BY MOUTH ONCE DAILY  90 tablet  0  . warfarin (COUMADIN) 10 MG tablet Take 1 tablet (10 mg total) by mouth daily at 6 PM.  30 tablet  0   No current facility-administered medications on file prior to visit.    No Known Allergies  Family History  Problem Relation Age of Onset  . Cancer Neg Hx      BP 122/86  Pulse 69  Temp(Src) 98.8 F (37.1 C) (Oral)  SpO2 95%   Review of Systems He denies hypoglycemia.  Insomnia has recurred    Objective:   Physical Exam VITAL SIGNS:  See vs page GENERAL: no distress.  Morbid obesity.  In wheelchair LUNGS:  Clear to auscultation  Lab Results  Component Value Date   CREATININE 1.3 05/12/2013   BUN 20 05/12/2013   NA 135 05/12/2013   K 4.6 05/12/2013   CL 105 05/12/2013   CO2 24 05/12/2013   Lab Results  Component Value Date   HGBA1C 6.6* 04/27/2013      Assessment & Plan:  DM: overcontrolled, given this regimen, which does match insulin to his changing needs throughout the day CHF: improved HTN: well-controlled Renal failure: much better Insomnia: given he is on a ventilator, he can take xanax.  Also, this is a palliative care situation, given his poor long-term prognosis.

## 2013-05-13 LAB — BASIC METABOLIC PANEL
BUN: 20 mg/dL (ref 6–23)
CO2: 24 mEq/L (ref 19–32)
Calcium: 8.9 mg/dL (ref 8.4–10.5)
Chloride: 105 mEq/L (ref 96–112)
Creatinine, Ser: 1.3 mg/dL (ref 0.4–1.5)
GFR: 68.34 mL/min (ref >60.00)
GLUCOSE: 125 mg/dL — AB (ref 70–99)
POTASSIUM: 4.6 meq/L (ref 3.5–5.1)
Sodium: 135 mEq/L (ref 135–145)

## 2013-05-13 NOTE — Telephone Encounter (Signed)
Please advise if ok to refill.  Medication not on current medication list.  Thanks!

## 2013-05-14 ENCOUNTER — Telehealth: Payer: Self-pay | Admitting: Endocrinology

## 2013-05-14 ENCOUNTER — Telehealth: Payer: Self-pay

## 2013-05-14 DIAGNOSIS — L97919 Non-pressure chronic ulcer of unspecified part of right lower leg with unspecified severity: Secondary | ICD-10-CM

## 2013-05-14 DIAGNOSIS — L97929 Non-pressure chronic ulcer of unspecified part of left lower leg with unspecified severity: Principal | ICD-10-CM

## 2013-05-14 NOTE — Telephone Encounter (Signed)
Hubbard Hartshorn from [place] called stating Home health care has closed their case out with the pt. On 04/25/2013 [facility] called stating the pt needed resumption of orders for pt for Diabetic education. [facility] was notified that if pt needed  DM education he could have it done in our office and the order were declined. [facility] called requesting the wrong orders. Pt was being seen for wound care. Since [facility] has closed their account out the pt will need a whole new referral for visits to begin again. Please advise, Thanks!

## 2013-05-14 NOTE — Telephone Encounter (Signed)
Error

## 2013-05-15 DIAGNOSIS — L97929 Non-pressure chronic ulcer of unspecified part of left lower leg with unspecified severity: Principal | ICD-10-CM

## 2013-05-15 DIAGNOSIS — L97919 Non-pressure chronic ulcer of unspecified part of right lower leg with unspecified severity: Secondary | ICD-10-CM | POA: Insufficient documentation

## 2013-05-15 NOTE — Telephone Encounter (Signed)
i ref pt to wound care

## 2013-05-15 NOTE — Telephone Encounter (Signed)
please call patient: Do you have any open sores on your legs or feet?

## 2013-05-15 NOTE — Telephone Encounter (Signed)
Pt has two open sores on his legs.

## 2013-05-19 NOTE — Telephone Encounter (Signed)
Got in touch with pt. He states that he will not be able to go to wound care due to lack of transportation issues.  Please advise, Thanks!

## 2013-05-19 NOTE — Telephone Encounter (Signed)
Ok, please ask [place] to address.

## 2013-05-20 NOTE — Telephone Encounter (Signed)
Home health is requesting that orders be placed for wound care due to the fact the Pt's account has been closed.  Please advise, Thanks!

## 2013-05-20 NOTE — Telephone Encounter (Signed)
done

## 2013-05-21 NOTE — Telephone Encounter (Signed)
Refilled on 04/30/2013.

## 2013-05-21 NOTE — Telephone Encounter (Signed)
Please make sure this has been taking care of, thanks

## 2013-05-21 NOTE — Telephone Encounter (Signed)
Informed pt that Travis Chapman completed [facility] info at [facility] last night told a pt to call them

## 2013-05-26 ENCOUNTER — Telehealth: Payer: Self-pay | Admitting: *Deleted

## 2013-05-26 ENCOUNTER — Other Ambulatory Visit: Payer: Self-pay | Admitting: Endocrinology

## 2013-05-28 ENCOUNTER — Telehealth: Payer: Self-pay

## 2013-05-28 NOTE — Telephone Encounter (Signed)
Pt called stating he has been having numbness in his knees and trouble with his balance. Pt sated he fell last night. The pt states his knees will just give out and will not have any strength. Wanted to know if anything could be done.  Please advise, Thanks!

## 2013-05-28 NOTE — Telephone Encounter (Signed)
please call patient: There are many possible causes of these sxs. Options: HH palliative care consult. Ov here

## 2013-05-30 NOTE — Telephone Encounter (Signed)
Requested call back.  

## 2013-05-30 NOTE — Telephone Encounter (Signed)
Pt informed scheduled for appointment on 06/02/2013.

## 2013-06-02 ENCOUNTER — Other Ambulatory Visit: Payer: Self-pay | Admitting: General Practice

## 2013-06-02 ENCOUNTER — Telehealth: Payer: Self-pay

## 2013-06-02 MED ORDER — WARFARIN SODIUM 10 MG PO TABS: ORAL_TABLET | ORAL | Status: DC

## 2013-06-02 NOTE — Telephone Encounter (Signed)
Received a refill request for Warfarin 10 mg. Pt was last seen on 05/12/2013.  Please advise if ok to refill. Thanks!

## 2013-06-02 NOTE — Telephone Encounter (Signed)
See below, Thanks!  

## 2013-06-02 NOTE — Telephone Encounter (Signed)
Please send this to cynthia boyd at [facility], who does this

## 2013-06-03 DIAGNOSIS — I890 Lymphedema, not elsewhere classified: Secondary | ICD-10-CM

## 2013-06-03 DIAGNOSIS — L97909 Non-pressure chronic ulcer of unspecified part of unspecified lower leg with unspecified severity: Secondary | ICD-10-CM

## 2013-06-03 DIAGNOSIS — J449 Chronic obstructive pulmonary disease, unspecified: Secondary | ICD-10-CM

## 2013-06-03 DIAGNOSIS — J4489 Other specified chronic obstructive pulmonary disease: Secondary | ICD-10-CM

## 2013-06-03 DIAGNOSIS — E1159 Type 2 diabetes mellitus with other circulatory complications: Secondary | ICD-10-CM

## 2013-06-12 ENCOUNTER — Other Ambulatory Visit: Payer: Self-pay | Admitting: Endocrinology

## 2013-06-12 ENCOUNTER — Telehealth: Payer: Self-pay | Admitting: Endocrinology

## 2013-06-12 NOTE — Telephone Encounter (Signed)
Patient's girlfriend called in stating that Mr. Resor needs Oxy-codone  Also can we refer him to another pain management clinic ?  Thank you :)

## 2013-06-12 NOTE — Telephone Encounter (Signed)
Please see below and advise.

## 2013-06-13 MED ORDER — OXYCODONE-ACETAMINOPHEN 10-325 MG PO TABS: 1.0000 | ORAL_TABLET | ORAL | Status: DC | PRN

## 2013-06-13 NOTE — Telephone Encounter (Signed)
Pt not available to talk. Informed girlfriend that his Rx is ready for pick up. Rx placed upfront. Pt advised to call back about pain mgmt.

## 2013-06-13 NOTE — Telephone Encounter (Signed)
i have printed.  Do you still want to see pain mgmt?

## 2013-06-23 ENCOUNTER — Other Ambulatory Visit: Payer: Self-pay | Admitting: Endocrinology

## 2013-06-24 ENCOUNTER — Telehealth: Payer: Self-pay | Admitting: Endocrinology

## 2013-06-24 ENCOUNTER — Other Ambulatory Visit: Payer: Self-pay | Admitting: Endocrinology

## 2013-06-24 NOTE — Telephone Encounter (Signed)
Pt needs PA for his sleep med-he doesn't know the name

## 2013-06-26 NOTE — Telephone Encounter (Signed)
Spoke with pt. He states that he was able to pick up his medication.

## 2013-06-26 NOTE — Telephone Encounter (Signed)
Unable to reach pt. Will try again at a later time.  

## 2013-07-03 ENCOUNTER — Other Ambulatory Visit: Payer: Self-pay | Admitting: Endocrinology

## 2013-07-04 ENCOUNTER — Other Ambulatory Visit: Payer: Self-pay | Admitting: *Deleted

## 2013-07-04 MED ORDER — AMLODIPINE BESYLATE 10 MG PO TABS: 10.0000 mg | ORAL_TABLET | Freq: Every day | ORAL | Status: DC

## 2013-07-06 ENCOUNTER — Emergency Department (HOSPITAL_COMMUNITY): Payer: Medicare Other

## 2013-07-06 ENCOUNTER — Observation Stay (HOSPITAL_COMMUNITY)
Admission: EM | Admit: 2013-07-06 | Discharge: 2013-07-08 | Disposition: A | Discharge status: Home / Self Care | Payer: Medicare Other | Attending: Internal Medicine | Admitting: Internal Medicine

## 2013-07-06 ENCOUNTER — Encounter (HOSPITAL_COMMUNITY): Payer: Self-pay | Admitting: *Deleted

## 2013-07-06 DIAGNOSIS — M25561 Pain in right knee: Secondary | ICD-10-CM

## 2013-07-06 DIAGNOSIS — N183 Chronic kidney disease, stage 3 unspecified: Secondary | ICD-10-CM | POA: Insufficient documentation

## 2013-07-06 DIAGNOSIS — Z86711 Personal history of pulmonary embolism: Secondary | ICD-10-CM | POA: Insufficient documentation

## 2013-07-06 DIAGNOSIS — M6281 Muscle weakness (generalized): Secondary | ICD-10-CM | POA: Insufficient documentation

## 2013-07-06 DIAGNOSIS — R5383 Other fatigue: Secondary | ICD-10-CM

## 2013-07-06 DIAGNOSIS — F411 Generalized anxiety disorder: Secondary | ICD-10-CM | POA: Diagnosis present

## 2013-07-06 DIAGNOSIS — E1143 Type 2 diabetes mellitus with diabetic autonomic (poly)neuropathy: Secondary | ICD-10-CM

## 2013-07-06 DIAGNOSIS — R635 Abnormal weight gain: Secondary | ICD-10-CM

## 2013-07-06 DIAGNOSIS — E1149 Type 2 diabetes mellitus with other diabetic neurological complication: Secondary | ICD-10-CM | POA: Insufficient documentation

## 2013-07-06 DIAGNOSIS — Z87891 Personal history of nicotine dependence: Secondary | ICD-10-CM

## 2013-07-06 DIAGNOSIS — R0789 Other chest pain: Secondary | ICD-10-CM

## 2013-07-06 DIAGNOSIS — E7800 Pure hypercholesterolemia, unspecified: Secondary | ICD-10-CM

## 2013-07-06 DIAGNOSIS — R531 Weakness: Secondary | ICD-10-CM | POA: Diagnosis present

## 2013-07-06 DIAGNOSIS — J9690 Respiratory failure, unspecified, unspecified whether with hypoxia or hypercapnia: Secondary | ICD-10-CM | POA: Diagnosis present

## 2013-07-06 DIAGNOSIS — K3184 Gastroparesis: Secondary | ICD-10-CM

## 2013-07-06 DIAGNOSIS — W19XXXA Unspecified fall, initial encounter: Secondary | ICD-10-CM

## 2013-07-06 DIAGNOSIS — J9620 Acute and chronic respiratory failure, unspecified whether with hypoxia or hypercapnia: Secondary | ICD-10-CM

## 2013-07-06 DIAGNOSIS — R0989 Other specified symptoms and signs involving the circulatory and respiratory systems: Secondary | ICD-10-CM

## 2013-07-06 DIAGNOSIS — F329 Major depressive disorder, single episode, unspecified: Secondary | ICD-10-CM | POA: Insufficient documentation

## 2013-07-06 DIAGNOSIS — R509 Fever, unspecified: Secondary | ICD-10-CM

## 2013-07-06 DIAGNOSIS — E860 Dehydration: Principal | ICD-10-CM

## 2013-07-06 DIAGNOSIS — E662 Morbid (severe) obesity with alveolar hypoventilation: Secondary | ICD-10-CM | POA: Diagnosis present

## 2013-07-06 DIAGNOSIS — I509 Heart failure, unspecified: Secondary | ICD-10-CM | POA: Insufficient documentation

## 2013-07-06 DIAGNOSIS — I493 Ventricular premature depolarization: Secondary | ICD-10-CM

## 2013-07-06 DIAGNOSIS — Y92009 Unspecified place in unspecified non-institutional (private) residence as the place of occurrence of the external cause: Secondary | ICD-10-CM

## 2013-07-06 DIAGNOSIS — I129 Hypertensive chronic kidney disease with stage 1 through stage 4 chronic kidney disease, or unspecified chronic kidney disease: Secondary | ICD-10-CM | POA: Insufficient documentation

## 2013-07-06 DIAGNOSIS — R29898 Other symptoms and signs involving the musculoskeletal system: Secondary | ICD-10-CM

## 2013-07-06 DIAGNOSIS — R0609 Other forms of dyspnea: Secondary | ICD-10-CM

## 2013-07-06 DIAGNOSIS — N289 Disorder of kidney and ureter, unspecified: Secondary | ICD-10-CM

## 2013-07-06 DIAGNOSIS — I428 Other cardiomyopathies: Secondary | ICD-10-CM

## 2013-07-06 DIAGNOSIS — J9600 Acute respiratory failure, unspecified whether with hypoxia or hypercapnia: Secondary | ICD-10-CM | POA: Insufficient documentation

## 2013-07-06 DIAGNOSIS — R809 Proteinuria, unspecified: Secondary | ICD-10-CM

## 2013-07-06 DIAGNOSIS — Z6841 Body Mass Index (BMI) 40.0 and over, adult: Secondary | ICD-10-CM | POA: Insufficient documentation

## 2013-07-06 DIAGNOSIS — F1021 Alcohol dependence, in remission: Secondary | ICD-10-CM

## 2013-07-06 DIAGNOSIS — R0600 Dyspnea, unspecified: Secondary | ICD-10-CM

## 2013-07-06 DIAGNOSIS — I10 Essential (primary) hypertension: Secondary | ICD-10-CM | POA: Diagnosis present

## 2013-07-06 DIAGNOSIS — Z5181 Encounter for therapeutic drug level monitoring: Secondary | ICD-10-CM

## 2013-07-06 DIAGNOSIS — Z930 Tracheostomy status: Secondary | ICD-10-CM

## 2013-07-06 DIAGNOSIS — Z8546 Personal history of malignant neoplasm of prostate: Secondary | ICD-10-CM | POA: Insufficient documentation

## 2013-07-06 DIAGNOSIS — Z79899 Other long term (current) drug therapy: Secondary | ICD-10-CM

## 2013-07-06 DIAGNOSIS — R002 Palpitations: Secondary | ICD-10-CM

## 2013-07-06 DIAGNOSIS — J45909 Unspecified asthma, uncomplicated: Secondary | ICD-10-CM

## 2013-07-06 DIAGNOSIS — R0902 Hypoxemia: Secondary | ICD-10-CM

## 2013-07-06 DIAGNOSIS — J9610 Chronic respiratory failure, unspecified whether with hypoxia or hypercapnia: Secondary | ICD-10-CM

## 2013-07-06 DIAGNOSIS — D649 Anemia, unspecified: Secondary | ICD-10-CM | POA: Diagnosis present

## 2013-07-06 DIAGNOSIS — R9431 Abnormal electrocardiogram [ECG] [EKG]: Secondary | ICD-10-CM

## 2013-07-06 DIAGNOSIS — M25519 Pain in unspecified shoulder: Secondary | ICD-10-CM

## 2013-07-06 DIAGNOSIS — C61 Malignant neoplasm of prostate: Secondary | ICD-10-CM

## 2013-07-06 DIAGNOSIS — R262 Difficulty in walking, not elsewhere classified: Secondary | ICD-10-CM

## 2013-07-06 DIAGNOSIS — M1990 Unspecified osteoarthritis, unspecified site: Secondary | ICD-10-CM

## 2013-07-06 DIAGNOSIS — J9611 Chronic respiratory failure with hypoxia: Secondary | ICD-10-CM

## 2013-07-06 DIAGNOSIS — I5032 Chronic diastolic (congestive) heart failure: Secondary | ICD-10-CM

## 2013-07-06 DIAGNOSIS — G4730 Sleep apnea, unspecified: Secondary | ICD-10-CM | POA: Insufficient documentation

## 2013-07-06 DIAGNOSIS — G4733 Obstructive sleep apnea (adult) (pediatric): Secondary | ICD-10-CM

## 2013-07-06 DIAGNOSIS — Z9911 Dependence on respirator [ventilator] status: Secondary | ICD-10-CM

## 2013-07-06 DIAGNOSIS — F3289 Other specified depressive episodes: Secondary | ICD-10-CM | POA: Insufficient documentation

## 2013-07-06 DIAGNOSIS — E1142 Type 2 diabetes mellitus with diabetic polyneuropathy: Secondary | ICD-10-CM | POA: Insufficient documentation

## 2013-07-06 DIAGNOSIS — L97919 Non-pressure chronic ulcer of unspecified part of right lower leg with unspecified severity: Secondary | ICD-10-CM

## 2013-07-06 DIAGNOSIS — I2699 Other pulmonary embolism without acute cor pulmonale: Secondary | ICD-10-CM

## 2013-07-06 DIAGNOSIS — H5440 Blindness, one eye, unspecified eye: Secondary | ICD-10-CM | POA: Insufficient documentation

## 2013-07-06 DIAGNOSIS — W19XXXD Unspecified fall, subsequent encounter: Secondary | ICD-10-CM

## 2013-07-06 DIAGNOSIS — L97909 Non-pressure chronic ulcer of unspecified part of unspecified lower leg with unspecified severity: Secondary | ICD-10-CM

## 2013-07-06 DIAGNOSIS — E1029 Type 1 diabetes mellitus with other diabetic kidney complication: Secondary | ICD-10-CM

## 2013-07-06 DIAGNOSIS — Z9181 History of falling: Secondary | ICD-10-CM | POA: Insufficient documentation

## 2013-07-06 DIAGNOSIS — R5381 Other malaise: Secondary | ICD-10-CM

## 2013-07-06 DIAGNOSIS — Z7901 Long term (current) use of anticoagulants: Secondary | ICD-10-CM | POA: Insufficient documentation

## 2013-07-06 DIAGNOSIS — M1710 Unilateral primary osteoarthritis, unspecified knee: Secondary | ICD-10-CM | POA: Insufficient documentation

## 2013-07-06 DIAGNOSIS — Z794 Long term (current) use of insulin: Secondary | ICD-10-CM | POA: Insufficient documentation

## 2013-07-06 DIAGNOSIS — L97929 Non-pressure chronic ulcer of unspecified part of left lower leg with unspecified severity: Secondary | ICD-10-CM | POA: Diagnosis present

## 2013-07-06 LAB — COMPREHENSIVE METABOLIC PANEL
ALT: 12 U/L (ref 0–53)
AST: 21 U/L (ref 0–37)
Albumin: 3.4 g/dL — ABNORMAL LOW (ref 3.5–5.2)
Alkaline Phosphatase: 74 U/L (ref 39–117)
BILIRUBIN TOTAL: 0.4 mg/dL (ref 0.3–1.2)
BUN: 30 mg/dL — ABNORMAL HIGH (ref 6–23)
CO2: 27 mEq/L (ref 19–32)
CREATININE: 1.72 mg/dL — AB (ref 0.50–1.35)
Calcium: 9.5 mg/dL (ref 8.4–10.5)
Chloride: 101 mEq/L (ref 96–112)
GFR calc Af Amer: 46 mL/min — ABNORMAL LOW (ref >90)
GFR, EST NON AFRICAN AMERICAN: 39 mL/min — AB (ref >90)
Glucose, Bld: 138 mg/dL — ABNORMAL HIGH (ref 70–99)
Potassium: 4.5 mEq/L (ref 3.7–5.3)
Sodium: 141 mEq/L (ref 137–147)
Total Protein: 7.7 g/dL (ref 6.0–8.3)

## 2013-07-06 LAB — CBC WITH DIFFERENTIAL/PLATELET
BASOS ABS: 0 10*3/uL (ref 0.0–0.1)
Basophils Relative: 0 % (ref 0–1)
Eosinophils Absolute: 0.1 10*3/uL (ref 0.0–0.7)
Eosinophils Relative: 1 % (ref 0–5)
HCT: 35.7 % — ABNORMAL LOW (ref 39.0–52.0)
HEMOGLOBIN: 11.4 g/dL — AB (ref 13.0–17.0)
Lymphocytes Relative: 13 % (ref 12–46)
Lymphs Abs: 0.8 10*3/uL (ref 0.7–4.0)
MCH: 28.6 pg (ref 26.0–34.0)
MCHC: 31.9 g/dL (ref 30.0–36.0)
MCV: 89.5 fL (ref 78.0–100.0)
Monocytes Absolute: 0.4 10*3/uL (ref 0.1–1.0)
Monocytes Relative: 6 % (ref 3–12)
Neutro Abs: 5 10*3/uL (ref 1.7–7.7)
Neutrophils Relative %: 80 % — ABNORMAL HIGH (ref 43–77)
Platelets: 194 10*3/uL (ref 150–400)
RBC: 3.99 MIL/uL — ABNORMAL LOW (ref 4.22–5.81)
RDW: 13.6 % (ref 11.5–15.5)
WBC: 6.3 10*3/uL (ref 4.0–10.5)

## 2013-07-06 LAB — URINALYSIS, ROUTINE W REFLEX MICROSCOPIC
BILIRUBIN URINE: NEGATIVE
Glucose, UA: NEGATIVE mg/dL
Ketones, ur: NEGATIVE mg/dL
Leukocytes, UA: NEGATIVE
Nitrite: NEGATIVE
PROTEIN: 100 mg/dL — AB
Specific Gravity, Urine: 1.017 (ref 1.005–1.030)
UROBILINOGEN UA: 1 mg/dL (ref 0.0–1.0)
pH: 6 (ref 5.0–8.0)

## 2013-07-06 LAB — GLUCOSE, CAPILLARY: Glucose-Capillary: 173 mg/dL — ABNORMAL HIGH (ref 70–99)

## 2013-07-06 LAB — MRSA PCR SCREENING: MRSA by PCR: NEGATIVE

## 2013-07-06 LAB — I-STAT CG4 LACTIC ACID, ED: LACTIC ACID, VENOUS: 1.18 mmol/L (ref 0.5–2.2)

## 2013-07-06 LAB — I-STAT TROPONIN, ED: Troponin i, poc: 0.01 ng/mL (ref 0.00–0.08)

## 2013-07-06 LAB — URINE MICROSCOPIC-ADD ON

## 2013-07-06 LAB — PROTIME-INR
INR: 1.11 (ref 0.00–1.49)
Prothrombin Time: 14.1 seconds (ref 11.6–15.2)

## 2013-07-06 LAB — CBG MONITORING, ED: Glucose-Capillary: 135 mg/dL — ABNORMAL HIGH (ref 70–99)

## 2013-07-06 MED ORDER — CARVEDILOL 6.25 MG PO TABS
6.2500 mg | ORAL_TABLET | Freq: Two times a day (BID) | ORAL | Status: DC
  Administered 2013-07-06 – 2013-07-08 (×4): 6.25 mg via ORAL
  Filled 2013-07-06 (×6): qty 1

## 2013-07-06 MED ORDER — ENOXAPARIN SODIUM 150 MG/ML ~~LOC~~ SOLN
165.0000 mg | Freq: Two times a day (BID) | SUBCUTANEOUS | Status: DC
  Administered 2013-07-06 – 2013-07-08 (×4): 165 mg via SUBCUTANEOUS
  Filled 2013-07-06 (×8): qty 2

## 2013-07-06 MED ORDER — SODIUM CHLORIDE 0.9 % IJ SOLN
3.0000 mL | Freq: Two times a day (BID) | INTRAMUSCULAR | Status: DC
  Administered 2013-07-06 – 2013-07-08 (×4): 3 mL via INTRAVENOUS

## 2013-07-06 MED ORDER — METOCLOPRAMIDE HCL 5 MG PO TABS
5.0000 mg | ORAL_TABLET | Freq: Three times a day (TID) | ORAL | Status: DC
  Administered 2013-07-06 – 2013-07-08 (×6): 5 mg via ORAL
  Filled 2013-07-06 (×8): qty 1

## 2013-07-06 MED ORDER — LOSARTAN POTASSIUM 50 MG PO TABS
100.0000 mg | ORAL_TABLET | Freq: Every day | ORAL | Status: DC
  Administered 2013-07-06 – 2013-07-08 (×3): 100 mg via ORAL
  Filled 2013-07-06 (×3): qty 2

## 2013-07-06 MED ORDER — CLONIDINE HCL 0.1 MG PO TABS
0.1000 mg | ORAL_TABLET | Freq: Three times a day (TID) | ORAL | Status: DC
  Administered 2013-07-06 – 2013-07-08 (×6): 0.1 mg via ORAL
  Filled 2013-07-06 (×8): qty 1

## 2013-07-06 MED ORDER — WARFARIN SODIUM 10 MG PO TABS: 10.0000 mg | ORAL_TABLET | Freq: Every day | ORAL | Status: DC

## 2013-07-06 MED ORDER — SENNOSIDES-DOCUSATE SODIUM 8.6-50 MG PO TABS: 1.0000 | ORAL_TABLET | Freq: Every evening | ORAL | Status: DC | PRN

## 2013-07-06 MED ORDER — SIMVASTATIN 20 MG PO TABS
20.0000 mg | ORAL_TABLET | Freq: Every day | ORAL | Status: DC
  Administered 2013-07-06 – 2013-07-08 (×3): 20 mg via ORAL
  Filled 2013-07-06 (×3): qty 1

## 2013-07-06 MED ORDER — WARFARIN - PHARMACIST DOSING INPATIENT
Freq: Every day | Status: DC
  Administered 2013-07-06: 18:00:00

## 2013-07-06 MED ORDER — OXYCODONE HCL 5 MG PO TABS
5.0000 mg | ORAL_TABLET | ORAL | Status: DC | PRN
  Administered 2013-07-06 – 2013-07-08 (×7): 5 mg via ORAL
  Filled 2013-07-06 (×7): qty 1

## 2013-07-06 MED ORDER — WARFARIN SODIUM 10 MG PO TABS
10.0000 mg | ORAL_TABLET | Freq: Once | ORAL | Status: AC
  Administered 2013-07-06: 10 mg via ORAL
  Filled 2013-07-06: qty 1

## 2013-07-06 MED ORDER — INSULIN ASPART 100 UNIT/ML ~~LOC~~ SOLN: 0.0000 [IU] | Freq: Every day | SUBCUTANEOUS | Status: DC

## 2013-07-06 MED ORDER — SODIUM CHLORIDE 0.9 % IV SOLN: 250.0000 mL | INTRAVENOUS | Status: DC | PRN

## 2013-07-06 MED ORDER — AMLODIPINE BESYLATE 10 MG PO TABS
10.0000 mg | ORAL_TABLET | Freq: Every day | ORAL | Status: DC
  Administered 2013-07-06 – 2013-07-08 (×3): 10 mg via ORAL
  Filled 2013-07-06 (×3): qty 1

## 2013-07-06 MED ORDER — ONDANSETRON HCL 4 MG PO TABS: 4.0000 mg | ORAL_TABLET | Freq: Four times a day (QID) | ORAL | Status: DC | PRN

## 2013-07-06 MED ORDER — HEPARIN SODIUM (PORCINE) 5000 UNIT/ML IJ SOLN
5000.0000 [IU] | Freq: Three times a day (TID) | INTRAMUSCULAR | Status: DC
Start: 2013-07-06 — End: 2013-07-06

## 2013-07-06 MED ORDER — SODIUM CHLORIDE 0.9 % IJ SOLN: 3.0000 mL | INTRAMUSCULAR | Status: DC | PRN

## 2013-07-06 MED ORDER — INSULIN GLARGINE 100 UNIT/ML ~~LOC~~ SOLN
20.0000 [IU] | Freq: Every morning | SUBCUTANEOUS | Status: DC
  Administered 2013-07-07 – 2013-07-08 (×2): 20 [IU] via SUBCUTANEOUS
  Filled 2013-07-06 (×2): qty 0.2

## 2013-07-06 MED ORDER — INSULIN ASPART 100 UNIT/ML ~~LOC~~ SOLN
0.0000 [IU] | Freq: Three times a day (TID) | SUBCUTANEOUS | Status: DC
  Administered 2013-07-07 (×3): 2 [IU] via SUBCUTANEOUS
  Administered 2013-07-08: 3 [IU] via SUBCUTANEOUS
  Administered 2013-07-08: 2 [IU] via SUBCUTANEOUS

## 2013-07-06 MED ORDER — ALPRAZOLAM 0.5 MG PO TABS
2.0000 mg | ORAL_TABLET | Freq: Every evening | ORAL | Status: DC | PRN
  Administered 2013-07-06 – 2013-07-07 (×2): 2 mg via ORAL
  Filled 2013-07-06 (×2): qty 4

## 2013-07-06 MED ORDER — CITALOPRAM HYDROBROMIDE 20 MG PO TABS
20.0000 mg | ORAL_TABLET | Freq: Every day | ORAL | Status: DC
  Administered 2013-07-06 – 2013-07-08 (×3): 20 mg via ORAL
  Filled 2013-07-06 (×3): qty 1

## 2013-07-06 MED ORDER — ALUM & MAG HYDROXIDE-SIMETH 200-200-20 MG/5ML PO SUSP: 30.0000 mL | Freq: Four times a day (QID) | ORAL | Status: DC | PRN

## 2013-07-06 MED ORDER — FUROSEMIDE 40 MG PO TABS
60.0000 mg | ORAL_TABLET | Freq: Every day | ORAL | Status: DC
  Administered 2013-07-06 – 2013-07-07 (×2): 60 mg via ORAL
  Filled 2013-07-06 (×3): qty 1

## 2013-07-06 MED ORDER — SODIUM CHLORIDE 0.9 % IJ SOLN
3.0000 mL | Freq: Two times a day (BID) | INTRAMUSCULAR | Status: DC
  Administered 2013-07-07 – 2013-07-08 (×2): 3 mL via INTRAVENOUS

## 2013-07-06 MED ORDER — ONDANSETRON HCL 4 MG/2ML IJ SOLN: 4.0000 mg | Freq: Four times a day (QID) | INTRAMUSCULAR | Status: DC | PRN

## 2013-07-06 NOTE — ED Provider Notes (Signed)
CSN: [id number]     Arrival date & time 07/06/13  1751 History   First MD Initiated Contact with Patient 07/06/13 1026     Chief Complaint  Patient presents with  . Extremity Weakness     (Consider location/radiation/quality/duration/timing/severity/associated sxs/prior Treatment) HPI 68 year old morbidly obese male with diabetes with neuropathy with chronic generalized weakness and tracheostomy for sleep apnea and chronic respiratory failure presents with worse than baseline generalized weakness over the last few days getting worse to the point where he fell without injury last night, but was unable to get up and cannot walk today due to generalized weakness especially in both of his legs with no new focal or lateralizing weakness no new numbness; he always has baseline numbness in both lower legs and feet from his diabetic neuropathy with chronic stasis dermatitis with no change in his leg appearance; with no fever no headache no chest pain no shortness of breath no cough no abdominal pain no vomiting or other concerns. Patient states he is too weak to care for himself at home. Past Medical History  Diagnosis Date  . ANXIETY 07/23/2006  . ASTHMA 07/23/2006  . DM 05/23/2007  . HYPERTENSION 07/23/2006  . OSTEOARTHRITIS 07/23/2006  . HYPERCHOLESTEROLEMIA 05/23/2007  . ANEMIA 08/31/2008  . Palpitations 02/10/2008  . ALCOHOL ABUSE, IN REMISSION, HX OF 08/26/2008  . TOBACCO ABUSE, HX OF 08/26/2008  . Pulmonary embolism 03/16/2010  . ED (erectile dysfunction)   . Morbid obesity   . DM retinopathy   . 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 DM neuropathy with neurologic complication   . CHF (congestive heart failure)     With a preserved EF  . 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 Sleep apnea   . Shortness of breath   . CARCINOMA, PROSTATE 09/29/2009   Past Surgical History  Procedure Laterality Date  . Back surgery     . Tracheostomy     Family History  Problem Relation Age of Onset  . Cancer Neg Hx    History  Substance Use Topics  . Smoking status: Former Smoker -- 0.50 packs/day for 15 years    Types: Cigarettes    Quit date: 01/16/2005  . Smokeless tobacco: Not on file  . Alcohol Use: No    Review of Systems 10 Systems reviewed and are negative for acute change except as noted in the HPI.   Allergies  Review of patient's allergies indicates no known allergies.  Home Medications   Prior to Admission medications   Medication Sig Start Date End Date Taking? Authorizing Provider  Alcohol Swabs PADS Apply 1 each topically daily.   Yes Historical Provider, MD  amLODipine (NORVASC) 10 MG tablet Take 1 tablet (10 mg total) by mouth daily. 07/04/13  Yes Renato Shin, MD  Blood Glucose Monitoring Suppl (ONE TOUCH ULTRA 2) W/DEVICE KIT Use to check blood sugars 2/day. 04/10/13  Yes Renato Shin, MD  carvedilol (COREG) 6.25 MG tablet Take 6.25 mg by mouth 2 (two) times daily with a meal.   Yes Historical Provider, MD  citalopram (CELEXA) 20 MG tablet Take 1 tablet (20 mg total) by mouth daily. 04/30/13  Yes Cherene Altes, MD  cloNIDine (CATAPRES) 0.1 MG tablet Take 0.1 mg by mouth daily. 02/12/13  Yes Renato Shin, MD  EASY COMFORT INSULIN SYRINGE 30G X 1/2" 1 ML MISC  11/25/12  Yes Historical  Provider, MD  insulin glargine (LANTUS) 100 UNIT/ML injection Inject 20 Units into the skin every morning. 01/13/13  Yes Renato Shin, MD  Lancets [facility] ULTRASOFT) lancets Use to check blood sugars 2/day. 04/10/13  Yes Renato Shin, MD  losartan (COZAAR) 100 MG tablet Take 100 mg by mouth daily. 05/27/13  Yes Historical Provider, MD  metoCLOPramide (REGLAN) 5 MG tablet Take 5 mg by mouth 3 (three) times daily.   Yes Historical Provider, MD  Multiple Vitamin (MULTIVITAMIN) tablet Take 1 tablet by mouth daily.    Yes Historical Provider, MD  olmesartan (BENICAR) 40 MG tablet Take 40 mg by mouth daily.   Yes  Historical Provider, MD  potassium chloride SA (K-DUR,KLOR-CON) 20 MEQ tablet Take 40 mEq by mouth daily.   Yes Historical Provider, MD  simvastatin (ZOCOR) 20 MG tablet Take 20 mg by mouth daily.   Yes Historical Provider, MD  warfarin (COUMADIN) 10 MG tablet Take as directed by anticoagulation clinic 06/02/13  Yes Renato Shin, MD  alprazolam Duanne Moron) 2 MG tablet Take 2 tablets (4 mg total) by mouth at bedtime. 07/07/13   Janece Canterbury, MD  enoxaparin (LOVENOX) 150 MG/ML injection Inject 1 mL (150 mg total) into the skin every 12 (twelve) hours. 07/07/13   Janece Canterbury, MD  furosemide (LASIX) 20 MG tablet Take 3 tablets (60 mg total) by mouth daily. 07/07/13   Janece Canterbury, MD  oxyCODONE-acetaminophen (PERCOCET) 10-325 MG per tablet Take 1 tablet by mouth every 6 (six) hours as needed for pain. 07/07/13   Janece Canterbury, MD   BP 133/58  Pulse 69  Temp(Src) 97.8 F (36.6 C) (Oral)  Resp 13  Ht _0  (1.956 m)  Wt 368 lb 9.8 oz (167.2 kg)  BMI 43.70 kg/m2  SpO2 96% Physical Exam  Nursing note and vitals reviewed. Constitutional:  Awake, alert, nontoxic appearance. Morbidly obese  HENT:  Head: Atraumatic.  Tracheostomy in place  Eyes: Right eye exhibits no discharge. Left eye exhibits no discharge.  Neck: Neck supple.  Cardiovascular: Normal rate and regular rhythm.   No murmur heard. Pulmonary/Chest: Effort normal and breath sounds normal. No respiratory distress. He has no wheezes. He has no rales. He exhibits no tenderness.  Abdominal: Soft. Bowel sounds are normal. He exhibits no distension and no mass. There is no tenderness. There is no rebound and no guarding.  Morbidly obese  Musculoskeletal: He exhibits edema. He exhibits no tenderness.  Baseline ROM, no obvious new focal weakness. Baseline chronic stasis dermatitis with baseline numbness both lower legs and feet with dorsalis pedis pulses intact bilaterally with capillary refill less than 2 seconds bilateral feet   Neurological: He is alert.  Mental status and motor strength appears baseline for patient and situation.  Skin: No rash noted.  Psychiatric: He has a normal mood and affect.    ED Course  Procedures (including critical care time) Patient understand and agree with initial ED impression and plan with expectations set for ED visit.CM and SW attempted SNF placement, initially [facility] had accepted, but Pt is on vent at night so not eligible for [employer] [facility]; SW and CM will continue to find placement but [facility] paged since no SNF available today. [facility]  CBC WITH DIFFERENTIAL - Abnormal; Notable for the following:    RBC 3.99 (*)    Hemoglobin 11.4 (*)    HCT 35.7 (*)    Neutrophils Relative % 80 (*)    All other components within normal limits  COMPREHENSIVE METABOLIC PANEL - Abnormal; Notable for the following:    Glucose, Bld 138 (*)    BUN 30 (*)    Creatinine, Ser 1.72 (*)    Albumin 3.4 (*)    GFR calc non Af Amer 39 (*)    GFR calc Af Amer 46 (*)    All other components within normal limits  URINALYSIS, ROUTINE W REFLEX MICROSCOPIC - Abnormal; Notable for the following:    Hgb urine dipstick SMALL (*)    Protein, ur 100 (*)    All other components within normal limits  BASIC METABOLIC PANEL - Abnormal; Notable for the following:    Glucose, Bld 148 (*)    Creatinine, Ser 1.42 (*)    GFR calc non Af Amer 50 (*)    GFR calc Af Amer 58 (*)    All other components within normal limits  CBC - Abnormal; Notable for the following:    RBC 3.87 (*)    Hemoglobin 10.9 (*)    HCT 33.7 (*)    All other components within normal limits  GLUCOSE, CAPILLARY - Abnormal; Notable for the following:    Glucose-Capillary 173 (*)    All other components within normal limits  GLUCOSE, CAPILLARY - Abnormal; Notable for the following:    Glucose-Capillary 121 (*)    All other components within normal limits  GLUCOSE, CAPILLARY - Abnormal; Notable for the  following:    Glucose-Capillary 160 (*)    All other components within normal limits  CBG MONITORING, ED - Abnormal; Notable for the following:    Glucose-Capillary 135 (*)    All other components within normal limits  MRSA PCR SCREENING  URINE MICROSCOPIC-ADD ON  PROTIME-INR  PROTIME-INR  PROTIME-INR  GLUCOSE, CAPILLARY  I-STAT CG4 LACTIC ACID, ED  I-STAT TROPOININ, ED    Imaging Review Dg Chest Port 1 View  07/06/2013   CLINICAL DATA:  Weakness, shortness of breath in the morning, which improves throughout today, history of congestive heart failure, asthma, and hypertension, former smoker  EXAM: PORTABLE CHEST - 1 VIEW  COMPARISON:  04/26/2013  FINDINGS: Tracheostomy tube projects over the tracheal air column. Moderate to severe enlargement of cardiac silhouette similar to prior study. Vascular pattern shows mild congestion. No edema or consolidation.  IMPRESSION: Significant cardiac enlargement.  No acute findings.   Electronically Signed   By: Skipper Cliche M.D.   On: 07/06/2013 11:06     EKG Interpretation None      MDM   Final diagnoses:  Generalized weakness  Chronic respiratory failure, unspecified whether with hypoxia or hypercapnia  Obesity hypoventilation syndrome    The patient appears reasonably stabilized for admission considering the current resources, flow, and capabilities available in the ED at this time, and I doubt any other [facility] requiring further screening and/or treatment in the ED prior to admission.    Babette Relic, MD 07/07/13 [phone]

## 2013-07-06 NOTE — H&P (Signed)
[facility] History and Physical  Karthikeya Funke NTI:[id number] DOB: 12-Feb-1945 DOA: 07/06/2013  Referring physician:  PCP: Renato Shin, MD   Chief Complaint: Weakness  HPI: Travis Chapman is a 68 y.o. male with a past medical history morbid obesity, obesity hypoventilation syndrome, status post tracheostomy with nighttime ventilator dependence, type 1 diabetes mellitus, stage III chronic kidney disease with baseline creatinine at 1.6-1.8, presented to the emergency department from home complaining of generalized weakness. It appears he had a similar presentation back in April of 2015 at which time he was admitted to the medicine service. At the time he presented with acute on chronic renal failure having creatinine of 2.0. improved to 1.6 by 04/30/2013. He complains of ongoing generalized weakness, progressively worse over the past 3 days. he states having a fall yesterday while ambulating with his walker in the hallway of his home and states he did not suffer serious injury. He denied unilateral weakness, numbness, slurred speech, visual changes, mental status changes or confusion. Also denies shortness of breath, chest pain, abdominal pain, diarrhea, nausea, vomiting, melena, bloody stools. He states feeling too weak to care for himself at home. lab work performed in the emergency room unremarkable. social work was consulted from the emergency room for placement to a skilled nursing facility, where he was apparently accepted at [facility] unfortunately facility and not accepting ventilator dependent patients. Social work attempted to Editor, commissioning in [place] as this facility accepts ventilator dependent patients, however the admission office was closed.                                                                                                                                                                                                                                     Review of  Systems:  Constitutional:  No weight loss, night sweats, Fevers, chills, fatigue. Positive for weakness HEENT:  No headaches, Difficulty swallowing,Tooth/dental problems,Sore throat,  No sneezing, itching, ear ache, nasal congestion, post nasal drip,  Cardio-vascular:  No chest pain, Orthopnea, PND, swelling in lower extremities, anasarca, dizziness, palpitations  GI:  No heartburn, indigestion, abdominal pain, nausea, vomiting, diarrhea, change in bowel habits, loss of appetite  Resp:  No shortness of breath with exertion or at rest. No excess mucus, no productive cough, No non-productive cough, No coughing up of blood.No change in color of mucus.No wheezing.No chest wall deformity  Skin:  no rash or lesions.  GU:  no dysuria, change in color of urine, no urgency or frequency. No flank pain.  Musculoskeletal:  No joint pain or swelling. No decreased range of motion. No back pain.  Psych:  No change in mood or affect. No depression or anxiety. No memory loss.   Past Medical History  Diagnosis Date  . ANXIETY 07/23/2006  . ASTHMA 07/23/2006  . DM 05/23/2007  . HYPERTENSION 07/23/2006  . OSTEOARTHRITIS 07/23/2006  . HYPERCHOLESTEROLEMIA 05/23/2007  . ANEMIA 08/31/2008  . Palpitations 02/10/2008  . ALCOHOL ABUSE, IN REMISSION, HX OF 08/26/2008  . TOBACCO ABUSE, HX OF 08/26/2008  . Pulmonary embolism 03/16/2010  . ED (erectile dysfunction)   . Morbid obesity   . DM retinopathy   . 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 DM neuropathy with neurologic complication   . CHF (congestive heart failure)     With a preserved EF  . 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 Sleep apnea   . Shortness of breath   . CARCINOMA, PROSTATE 09/29/2009   Past Surgical History  Procedure Laterality Date  . Back surgery    . Tracheostomy     Social History:  reports that he quit smoking about 8 years ago. His smoking use included  Cigarettes. He has a 7.5 pack-year smoking history. He does not have any smokeless tobacco history on file. He reports that he does not drink alcohol. His drug history is not on file.  No Known Allergies  Family History  Problem Relation Age of Onset  . Cancer Neg Hx      Prior to Admission medications   Medication Sig Start Date End Date Taking? Authorizing Provider  Alcohol Swabs PADS Apply 1 each topically daily.   Yes Historical Provider, MD  alprazolam Duanne Moron) 2 MG tablet Take 4 mg by mouth at bedtime.   Yes Historical Provider, MD  amLODipine (NORVASC) 10 MG tablet Take 1 tablet (10 mg total) by mouth daily. 07/04/13  Yes Renato Shin, MD  Blood Glucose Monitoring Suppl (ONE TOUCH ULTRA 2) W/DEVICE KIT Use to check blood sugars 2/day. 04/10/13  Yes Renato Shin, MD  carvedilol (COREG) 6.25 MG tablet Take 6.25 mg by mouth 2 (two) times daily with a meal.   Yes Historical Provider, MD  citalopram (CELEXA) 20 MG tablet Take 1 tablet (20 mg total) by mouth daily. 04/30/13  Yes Cherene Altes, MD  cloNIDine (CATAPRES) 0.1 MG tablet Take 0.1 mg by mouth daily. 02/12/13  Yes Renato Shin, MD  EASY COMFORT INSULIN SYRINGE 30G X 1/2" 1 ML MISC  11/25/12  Yes Historical Provider, MD  furosemide (LASIX) 20 MG tablet Take 3 tablets (60 mg total) by mouth 2 (two) times daily. 04/30/13  Yes Cherene Altes, MD  insulin glargine (LANTUS) 100 UNIT/ML injection Inject 20 Units into the skin every morning. 01/13/13  Yes Renato Shin, MD  Lancets [facility] ULTRASOFT) lancets Use to check blood sugars 2/day. 04/10/13  Yes Renato Shin, MD  losartan (COZAAR) 100 MG tablet Take 100 mg by mouth daily. 05/27/13  Yes Historical Provider, MD  metoCLOPramide (REGLAN) 5 MG tablet Take 5 mg by mouth 3 (three) times daily.   Yes Historical Provider, MD  Multiple Vitamin (MULTIVITAMIN) tablet Take 1 tablet by mouth daily.    Yes Historical Provider, MD  olmesartan (BENICAR) 40 MG tablet Take 40 mg by mouth daily.  Yes Historical Provider, MD  potassium chloride SA (K-DUR,KLOR-CON) 20 MEQ tablet Take 40 mEq by mouth daily.   Yes Historical Provider, MD  simvastatin (ZOCOR) 20 MG tablet Take 20 mg by mouth daily.   Yes Historical Provider, MD  warfarin (COUMADIN) 10 MG tablet Take as directed by anticoagulation clinic 06/02/13  Yes Renato Shin, MD   Physical Exam: Filed Vitals:   07/06/13 1547  BP: 151/109  Pulse: 79  Temp:   Resp: 16    BP 151/109  Pulse 79  Temp(Src) 98.2 F (36.8 C) (Oral)  Resp 16  SpO2 96%  General:  Appears calm and comfortable, no acute distress. Awake, alert, oriented. Morbidly obese, status post tracheostomy placement Eyes: PERRL, normal lids, irises & conjunctiva ENT: grossly normal hearing, lips & tongue Neck: no LAD, masses or thyromegaly, status post tracheostomy placement Cardiovascular: Diminished heart sounds likely related to body habitus, RRR, no m/r/g.  Telemetry: SR, no arrhythmias  Respiratory: CTA bilaterally, no w/r/r. Normal respiratory effort. Abdomen: soft, ntnd Skin: Chronic venous stasis changes to lower extremities, with healing ulcer over left shin, no localized erythema, purulence or fluctuant mass.  Musculoskeletal: 1+ bilateral lower extremity edema.  Psychiatric: grossly normal mood and affect, speech fluent and appropriate Neurologic: grossly non-focal.          Labs on Admission:  Basic Metabolic Panel:  Recent Labs Lab 07/06/13 1129  NA 141  K 4.5  CL 101  CO2 27  GLUCOSE 138*  BUN 30*  CREATININE 1.72*  CALCIUM 9.5   Liver Function Tests:  Recent Labs Lab 07/06/13 1129  AST 21  ALT 12  ALKPHOS 74  BILITOT 0.4  PROT 7.7  ALBUMIN 3.4*   No results found for this basename: LIPASE, AMYLASE,  in the last 168 hours No results found for this basename: AMMONIA,  in the last 168 hours CBC:  Recent Labs Lab 07/06/13 1129  WBC 6.3  NEUTROABS 5.0  HGB 11.4*  HCT 35.7*  MCV 89.5  PLT 194   Cardiac Enzymes: No  results found for this basename: CKTOTAL, CKMB, CKMBINDEX, TROPONINI,  in the last 168 hours  BNP (last 3 results)  Recent Labs  11/24/12 1250 11/26/12 0600 03/10/13 1630  PROBNP 264.4* 596.6* 58.0   CBG:  Recent Labs Lab 07/06/13 1057  GLUCAP 135*    Radiological Exams on Admission: Dg Chest Port 1 View  07/06/2013   CLINICAL DATA:  Weakness, shortness of breath in the morning, which improves throughout today, history of congestive heart failure, asthma, and hypertension, former smoker  EXAM: PORTABLE CHEST - 1 VIEW  COMPARISON:  04/26/2013  FINDINGS: Tracheostomy tube projects over the tracheal air column. Moderate to severe enlargement of cardiac silhouette similar to prior study. Vascular pattern shows mild congestion. No edema or consolidation.  IMPRESSION: Significant cardiac enlargement.  No acute findings.   Electronically Signed   By: Skipper Cliche M.D.   On: 07/06/2013 11:06    EKG: Independently reviewed.   Assessment/Plan Principal Problem:   Generalized weakness Active Problems:   Fall at home   Weakness   Tracheostomy dependent   Chronic respiratory failure   Obesity hypoventilation syndrome   1. Generalized weakness. Patient with multiple comorbidities including morbid obesity, obesity hypoventilation syndrome, presenting with complaints of generalized weakness having a fall at home. In the month of April he was admitted with similar complaints, at the time found to be dehydrated. It is conceivable that volume depletion may be playing a role this  time, although I think deconditioning, obesity and osteoarthritis of bilateral knees are likely contributors to his weakness. Will decrease his Lasix dose from 60 mg by mouth twice a day to 60 mg by mouth daily, monitor ins and outs and daily weights. Consult physical therapy, social work already assisting with SNF placement. 2. Stage III chronic kidney disease, baseline creatinine fluctuating between 1.6 and 1.8.  Initial lab work showed a creatinine 1.7. Will decrease Lasix dose from 60 mg twice a day to 60 mg daily as it is possible dehydration maybe causing some of his weakness 3. Obesity hypoventilation syndrome, status post tracheostomy, with ventilator dependence at night time. Patient will be admitted to the step down unit for her nighttime ventilator support. At this time he does not appear to have acute respiratory issues. Chest x-ray performed in the ER did not reveal acute cardiopulmonary disease 4. Chronic anticoagulation. Patient with history of pulmonary embolism, on Coumadin therapy. Will check a PT/INR, or seek consultation for Coumadin management. 5. DVT prophylaxis. Patient anticoagulated with warfarin 6. Disposition. On his last hospitalization in April SNF placement was offered however patient declined. At this point in time he is agreeable to receive rehabilitation at SNF  Code Status: Full Family Communication: SPoke with his daughter at bedside Disposition Plan: Admit to step down  Time spent: 55 min  Kelvin Cellar [facility] Pager [phone]  **Disclaimer: This note may have been dictated with voice recognition software. Similar sounding words can inadvertently be transcribed and this note may contain transcription errors which may not have been corrected upon publication of note.**

## 2013-07-06 NOTE — Progress Notes (Signed)
Follow up note  Patient with h/o PE was on coumadin. Labs were followed up and noted an of  INR of 1.11. I suspect medication nonadherence, as he had been subtherapeutic on past admissions. Pharmacy consultation place for Lovenox bridge.

## 2013-07-06 NOTE — Progress Notes (Signed)
Clinical Social Work Department BRIEF PSYCHOSOCIAL ASSESSMENT 07/06/2013  Patient:  Travis Chapman, Travis Chapman     Account Number:  [id number]     Admit date:  07/06/2013  Clinical Social Worker:  Su Monks  Date/Time:  07/06/2013 12:11 PM  Referred by:  Physician  Date Referred:  07/06/2013 Referred for  SNF Placement   Other Referral:   Interview type:  Patient Other interview type:    PSYCHOSOCIAL DATA Living Status:  FAMILY Admitted from facility:   Level of care:   Primary support name:  Ch[place]an, [place] Daughter [phone] Primary support relationship to patient:  CHILD, ADULT Degree of support available:   Unknown    CURRENT CONCERNS Current Concerns  Post-Acute Placement   Other Concerns:    SOCIAL WORK ASSESSMENT / PLAN Weekend CSW received referral for SNF placement. CSW met with patient who states that he is currently at hospital due to weakness. He recently fell and reports that his is not able to currently ambulate. Patient reports that he is typically able to ambulate with his walker at baseline. He lives with his elderly mother. CSW inquired if patient would be interested in short-term rehab. Patient states that he would be interested but was told during his last hospital admission that his only SNF option may be in [place]. CSW requested permission to make local SNF referrals to see if he could be placed, patient agreeable.   Assessment/plan status:  Information/Referral to [employer] Other assessment/ plan:   Information/referral to community resources:   Weekend CSW initiated [employer] in [place], [place], and [employer].    PATIENT'S/FAMILY'S RESPONSE TO PLAN OF CARE: Patient states that he would be agreeable to short-term SNF if placement found locally.     Tilden Fossa, MSW, [place] Clinical Social Worker [facility] Emergency Dept. [phone]

## 2013-07-06 NOTE — Progress Notes (Signed)
Weekend CSW contacted the following facilities regarding referrals:  [facility]- no one available to review referral until Monday 6/22 [facility]- no one available to review referral until Monday 6/22 [facility]- no one available to review referral until Monday 6/22 [facility]- awaiting call back  Tilden Fossa, MSW, [place] Emergency Dept. [phone]

## 2013-07-06 NOTE — ED Notes (Signed)
Lactic acid results given to Dr. Stevie Kern

## 2013-07-06 NOTE — Progress Notes (Addendum)
Clinical Social Work Department CLINICAL SOCIAL WORK PLACEMENT NOTE 07/06/2013  Patient:  Travis Chapman, Travis Chapman  Account Number:  [id number] Admit date:  07/06/2013  Clinical Social Worker:  Tilden Fossa, Latanya Presser  Date/time:  07/06/2013 12:17 PM  Clinical Social Work is seeking post-discharge placement for this patient at the following level of care:   SKILLED NURSING   (*CSW will update this form in Epic as items are completed)   07/07/13  Patient/family provided with [facility] Department of Clinical Social Work's list of facilities offering this level of care within the geographic area requested by the patient (or if unable, by the patient's family).  07/06/2013  Patient/family informed of their freedom to choose among providers that offer the needed level of care, that participate in Medicare, Medicaid or managed care program needed by the patient, have an available bed and are willing to accept the patient.  N/A-- not sent to this facility  Patient/family informed of [facility]' ownership interest in [facility], as well as of the fact that they are under no obligation to receive care at this facility.  PASARR submitted to EDS on EXISTING   PASARR number received on EXISTING  FL2 transmitted to all facilities in geographic area requested by pt/family on  07/06/2013 FL2 transmitted to all facilities within larger geographic area on 07/06/2013  Patient informed that his/her managed care company has contracts with or will negotiate with  certain facilities, including the following:     Patient/family informed of bed offers received:   Patient chooses bed at  Physician recommends and patient chooses bed at    Patient to be transferred to  on   Patient to be transferred to facility by  Patient and family notified of transfer on  Name of family member notified:    The following physician request were entered in Epic:   Additional Comments: Unit CSW made referrals to  [place], [place] facilities in [place], [place], and [place] respectively. Pt states he is not interested in going to a [place] facility because this is too far from his elderly mother. CSW called admissions with [facility] and asked her to review referral and call CSW ASAP for bed availability.  Tilden Fossa, MSW, [place] Clinical Social Worker [facility] Emergency Dept. [place], MSW, [facility] Clinical Social Worker [phone]

## 2013-07-06 NOTE — ED Notes (Signed)
Pt in from home via [facility], per report pt c/o bil lower leg weakness onset 2-3 days worse on R leg, pt was ambulating with walker today & started to feel weak & lowered himself to the floor, pt denies fall, head injury & LOC, pt has trach in place, pt speaks in short sentences, denies pain, A&O x4

## 2013-07-06 NOTE — Progress Notes (Addendum)
Weekend CSW received call from staff member at [facility] offering patient a bed this afternoon. Facility requesting that [place] fax discharge summary to [phone] when available.   Weekend CSW met with patient and his two daughters present at the bedside. CSW explained bed offer from [facility] as well as the challenges of placement due to trach. Patient and family verbalized their understanding and are agreeable for patient to be transferred to [facility] today.   CSW updated MD.  Tilden Fossa, MSW, [place] Emergency Dept. [phone]

## 2013-07-06 NOTE — ED Notes (Signed)
CBG 135  

## 2013-07-06 NOTE — Progress Notes (Signed)
Patient and family informed CSW that patient uses ventilator at nighttime. CSW unaware of this previously. CSW has contacted facility to inquire if they can accommodate patient's need for ventilator. Awaiting call back.   Tilden Fossa, MSW, [place] Clinical Social Worker [facility] Emergency Dept. [phone]

## 2013-07-06 NOTE — Progress Notes (Addendum)
ANTICOAGULATION CONSULT NOTE - Initial Consult  Pharmacy Consult for Warfarin Indication: pulmonary embolus, history  No Known Allergies  Patient Measurements: Height: 6\' 5"  (195.6 cm) Weight: 367 lb 8.1 oz (166.7 kg) IBW/kg (Calculated) : 89.1  Vital Signs: Temp: 98.5 F (36.9 C) (06/21 1711) Temp src: Oral (06/21 1711) BP: 140/68 mmHg (06/21 1711) Pulse Rate: 78 (06/21 1711)  Labs:  Recent Labs  07/06/13 1129 07/06/13 1638  HGB 11.4*  --   HCT 35.7*  --   PLT 194  --   LABPROT  --  14.1  INR  --  1.11  CREATININE 1.72*  --     Estimated Creatinine Clearance: 70.8 ml/min (by C-G formula based on Cr of 1.72).   Medical History: Past Medical History  Diagnosis Date  . ANXIETY 07/23/2006  . ASTHMA 07/23/2006  . DM 05/23/2007  . HYPERTENSION 07/23/2006  . OSTEOARTHRITIS 07/23/2006  . HYPERCHOLESTEROLEMIA 05/23/2007  . ANEMIA 08/31/2008  . Palpitations 02/10/2008  . ALCOHOL ABUSE, IN REMISSION, HX OF 08/26/2008  . TOBACCO ABUSE, HX OF 08/26/2008  . Pulmonary embolism 03/16/2010  . ED (erectile dysfunction)   . Morbid obesity   . DM retinopathy   . 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 DM neuropathy with neurologic complication   . CHF (congestive heart failure)     With a preserved EF  . 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 Sleep apnea   . Shortness of breath   . CARCINOMA, PROSTATE 09/29/2009    Medications:  Prescriptions prior to admission  Medication Sig Dispense Refill  . Alcohol Swabs PADS Apply 1 each topically daily.      Marland Kitchen alprazolam (XANAX) 2 MG tablet Take 4 mg by mouth at bedtime.      Marland Kitchen amLODipine (NORVASC) 10 MG tablet Take 1 tablet (10 mg total) by mouth daily.  30 tablet  1  . Blood Glucose Monitoring Suppl (ONE TOUCH ULTRA 2) W/DEVICE KIT Use to check blood sugars 2/day.  1 each  0  . carvedilol (COREG) 6.25 MG tablet Take 6.25 mg by mouth 2 (two) times daily with a  meal.      . citalopram (CELEXA) 20 MG tablet Take 1 tablet (20 mg total) by mouth daily.  30 tablet  4  . cloNIDine (CATAPRES) 0.1 MG tablet Take 0.1 mg by mouth daily.      Marland Kitchen EASY COMFORT INSULIN SYRINGE 30G X 1/2" 1 ML MISC       . furosemide (LASIX) 20 MG tablet Take 3 tablets (60 mg total) by mouth 2 (two) times daily.  180 tablet  0  . insulin glargine (LANTUS) 100 UNIT/ML injection Inject 20 Units into the skin every morning.      . Lancets (ONETOUCH ULTRASOFT) lancets Use to check blood sugars 2/day.  100 each  2  . losartan (COZAAR) 100 MG tablet Take 100 mg by mouth daily.      . metoCLOPramide (REGLAN) 5 MG tablet Take 5 mg by mouth 3 (three) times daily.      . Multiple Vitamin (MULTIVITAMIN) tablet Take 1 tablet by mouth daily.       Marland Kitchen olmesartan (BENICAR) 40 MG tablet Take 40 mg by mouth daily.      . potassium chloride SA (K-DUR,KLOR-CON) 20 MEQ tablet Take 40 mEq by mouth daily.      . simvastatin (ZOCOR)  20 MG tablet Take 20 mg by mouth daily.      Marland Kitchen warfarin (COUMADIN) 10 MG tablet Take as directed by anticoagulation clinic  35 tablet  3    Assessment: 68 yo M admitted 07/06/2013 with progressive weakness. Pharmacy consulted to continue warfarin for history of PE  PMH: Obesity hypoventilation syndrome night time vent dependant, stage III CKD  Coag: history of PE, INR below goal on 10 mg daily PTA (unclear if patient was taking medication).   Goal of Therapy:  INR 2-3 Monitor platelets by anticoagulation protocol: Yes   Plan:  1. Warfarin 10 mg x 1 tonight 2. Daily INR  Thank you for allowing pharmacy to be a part of this patients care team.  Rowe Robert Pharm.D., BCPS, AQ-Cardiology Clinical Pharmacist 07/06/2013 5:23 PM Pager: [phone] Phone: [phone]   6:10 PM Order received to bridge with lovenox, 165 mg SQ q12h.  Follow periodic CBC.  [place]

## 2013-07-06 NOTE — Progress Notes (Signed)
Weekend CSW received call back from [facility] Staff stating that their facility does not accept ventilator dependent patients.  Weekend CSW contacted [facility] in [place] [phone],  as their facility does accept patients with ventilators. Left voicemail for admissions coordinator Remus Blake, awaiting call back.    CSW contacted [facility] in [place] regarding SNF vent bed availability. No admissions coordinator available until Monday 07/07/13.   CSW contacted [facility] [phone] ext. [phone]. Admission coordinator Erline Levine not available until Monday 07/07/13.  CSW will update family and MD.  Tilden Fossa, MSW, [place] Emergency Dept. [phone]'

## 2013-07-07 DIAGNOSIS — E860 Dehydration: Secondary | ICD-10-CM

## 2013-07-07 DIAGNOSIS — I5032 Chronic diastolic (congestive) heart failure: Secondary | ICD-10-CM

## 2013-07-07 LAB — GLUCOSE, CAPILLARY
GLUCOSE-CAPILLARY: 160 mg/dL — AB (ref 70–99)
Glucose-Capillary: 121 mg/dL — ABNORMAL HIGH (ref 70–99)
Glucose-Capillary: 130 mg/dL — ABNORMAL HIGH (ref 70–99)
Glucose-Capillary: 125 mg/dL — ABNORMAL HIGH (ref 70–99)

## 2013-07-07 LAB — BASIC METABOLIC PANEL
BUN: 22 mg/dL (ref 6–23)
CHLORIDE: 99 meq/L (ref 96–112)
CO2: 28 mEq/L (ref 19–32)
Calcium: 8.8 mg/dL (ref 8.4–10.5)
Creatinine, Ser: 1.42 mg/dL — ABNORMAL HIGH (ref 0.50–1.35)
GFR calc Af Amer: 58 mL/min — ABNORMAL LOW (ref >90)
GFR calc non Af Amer: 50 mL/min — ABNORMAL LOW (ref >90)
GLUCOSE: 148 mg/dL — AB (ref 70–99)
Potassium: 3.7 mEq/L (ref 3.7–5.3)
Sodium: 138 mEq/L (ref 137–147)

## 2013-07-07 LAB — CBC
HEMATOCRIT: 33.7 % — AB (ref 39.0–52.0)
Hemoglobin: 10.9 g/dL — ABNORMAL LOW (ref 13.0–17.0)
MCH: 28.2 pg (ref 26.0–34.0)
MCHC: 32.3 g/dL (ref 30.0–36.0)
MCV: 87.1 fL (ref 78.0–100.0)
Platelets: 193 10*3/uL (ref 150–400)
RBC: 3.87 MIL/uL — ABNORMAL LOW (ref 4.22–5.81)
RDW: 13 % (ref 11.5–15.5)
WBC: 5.4 10*3/uL (ref 4.0–10.5)

## 2013-07-07 LAB — PROTIME-INR
INR: 1.15 (ref 0.00–1.49)
Prothrombin Time: 14.5 seconds (ref 11.6–15.2)

## 2013-07-07 MED ORDER — ALPRAZOLAM 2 MG PO TABS: 4.0000 mg | ORAL_TABLET | Freq: Every day | ORAL | Status: DC

## 2013-07-07 MED ORDER — WARFARIN SODIUM 10 MG PO TABS
10.0000 mg | ORAL_TABLET | Freq: Once | ORAL | Status: AC
  Administered 2013-07-07: 10 mg via ORAL
  Filled 2013-07-07: qty 1

## 2013-07-07 MED ORDER — FUROSEMIDE 20 MG PO TABS: 60.0000 mg | ORAL_TABLET | Freq: Every day | ORAL | Status: DC

## 2013-07-07 MED ORDER — ENOXAPARIN SODIUM 150 MG/ML ~~LOC~~ SOLN: 150.0000 mg | Freq: Two times a day (BID) | SUBCUTANEOUS | Status: DC

## 2013-07-07 MED ORDER — OXYCODONE-ACETAMINOPHEN 10-325 MG PO TABS: 1.0000 | ORAL_TABLET | Freq: Four times a day (QID) | ORAL | Status: DC | PRN

## 2013-07-07 NOTE — Progress Notes (Signed)
Patient requested to come off vent at this time.  Patient on room air.  Patient does not wish to wear ATC.

## 2013-07-07 NOTE — Progress Notes (Signed)
Placed patient on vent for QHS.  Patient tolerating well at this time.  VT >300.

## 2013-07-07 NOTE — Progress Notes (Signed)
Placed patient on mechanical ventilation for QHS.  Patient tolerating well at this time. RN aware. RT will continue to monitor.

## 2013-07-07 NOTE — Discharge Summary (Addendum)
Physician Discharge Summary  My [place] MBW:[id number] DOB: 08-29-1945 DOA: 07/06/2013  PCP: Renato Shin, MD  Admit date: 07/06/2013 Discharge date: 07/07/2013  Recommendations for Outpatient Follow-up:  1. F/u with primary care doctor in 1 month or sooner as needed if discharged 2. Home health PT/SW to assist with transition to SNF when bed available 3. Daily weights, notify supervising MD if patient gains more than 3-lbs in 1 day or 5-lbs in 7 days 4. INR check on Wed 5. Continue lovenox $RemoveBeforeDE'165mg'[id number]$  BID until INR therapeutic (between 2 and 3) 6. Vent at night:  PIP 15/PEEP5/21% 7. Change foam dressings on legs every other day or sooner as needed  Discharge Diagnoses:  Principal Problem:   Generalized weakness due to dehydration Active Problems:   Weakness   Tracheostomy dependent   Chronic respiratory failure   Obesity hypoventilation syndrome   Fall at home   Discharge Condition: stable  Diet recommendation: diabetic, healthy heart  Wt Readings from Last 3 Encounters:  07/07/13 167.2 kg (368 lb 9.8 oz)  04/30/13 173.2 kg (381 lb 13.4 oz)  03/29/13 100.245 kg (221 lb)    History of present illness:  Travis Chapman is a 68 y.o. male with a past medical history morbid obesity, obesity hypoventilation syndrome, status post tracheostomy with nighttime ventilator dependence, type 1 diabetes mellitus, stage III chronic kidney disease with baseline creatinine at 1.6-1.8, presented to the emergency department from home complaining of generalized weakness. It appears he had a similar presentation back in April of 2015 at which time he was admitted to the medicine service. At the time he presented with acute on chronic renal failure having creatinine of 2.0. improved to 1.6 by 04/30/2013. He complains of ongoing generalized weakness, progressively worse over the past 3 days. he states having a fall yesterday while ambulating with his walker in the hallway of his home and states he did not suffer  serious injury. He denied unilateral weakness, numbness, slurred speech, visual changes, mental status changes or confusion. Also denies shortness of breath, chest pain, abdominal pain, diarrhea, nausea, vomiting, melena, bloody stools. He states feeling too weak to care for himself at home. lab work performed in the emergency room unremarkable. social work was consulted from the emergency room for placement to a skilled nursing facility, where he was apparently accepted at [facility] unfortunately facility and not accepting ventilator dependent patients. Social work attempted to Editor, commissioning in [place] as this facility accepts ventilator dependent patients, however the admission office was closed.   Hospital Course:   1. Generalized weakness. Patient with multiple comorbidities including morbid obesity, obesity hypoventilation syndrome, presenting with complaints of generalized weakness having a fall at home. In the month of April he was admitted with similar complaints, at the time found to be dehydrated. It is conceivable that volume depletion may be playing a role this time, although I think deconditioning, obesity and osteoarthritis of bilateral knees are likely contributors to his weakness. His Lasix was decreased from 60 mg by mouth twice a day to 60 mg by mouth daily.  Continue daily weights and if he gains more than 3-lbs in 1 day or 5-lbs in 7 days, notify supervising physician so lasix dose can be readdressed.  PT/OT continue to work on improving strength and mobility.  Encouraged weight loss.  A bed was not available immediately at SNF capable of handling nocturnal ventilation and patient requested to go home and go to SNF for rehab when bed available.  Home health services  arranged.   2. Stage III chronic kidney disease, baseline creatinine fluctuates between 1.6 and 1.8.  Lasix dose was decreased from 60 mg twice a day to 60 mg daily and his BUN and creatinine trended down slightly.   3. Obesity hypoventilation syndrome, status post tracheostomy, with ventilator dependence at night time. Patient was admitted to the step down unit for nighttime ventilator support. At this time he does not appear to have acute respiratory issues. Chest x-ray performed in the ER did not reveal acute cardiopulmonary disease 4. Chronic anticoagulation. Patient with history of pulmonary embolism, on Coumadin therapy.  INR was subtherapeutic at admission.  At home he was supposed to be taking 10mg  daily, but it was unclear if he was taking this pill.  Continue warfarin 10mg  daily and carefully monitor INR.  If not rising, then adjust dose as needed. Recommend lovenox injections 165mg  every 12 hours until INR therapeutic since he may have been subtherapeutic for a long time and it may take a while to become therapeutic again. 5. HTN, blood pressure stable.  He continued norvasc, clonidine, losartan.   6. Depression/anxiety, stable.  Continue celexa and xanax prn. 7. DM, stable.  Last A1c 6.6 on 04/27/13. Continue lantus.   8. Normocytic anemia, likely of chronic disease and mild, stable.  Defer to supervising physician.    Procedures:  CXR  Consultations:  None  Discharge Exam: Filed Vitals:   07/07/13 0600  BP: 127/76  Pulse:   Temp:   Resp: 17   Filed Vitals:   07/07/13 0355 07/07/13 0400 07/07/13 0542 07/07/13 0600  BP:  139/74 121/50 127/76  Pulse: 66 67 68   Temp:  98.6 F (37 C) 98.2 F (36.8 C)   TempSrc:  Oral Oral   Resp:  19 20 17   Height:      Weight:    167.2 kg (368 lb 9.8 oz)  SpO2: 98% 96% 95% 97%    General: AAM, NAD HEENT:  NCAT, MMM Cardiovascular: RRR, no obvious mrg, 2+ pulses Respiratory: CTAB, no increased WOB ABD:  Obese, NABS, soft, NT MSK:  1+ bilateral lower extremity edema with chronic venous stasis changes.  Normal tone and bulk Skin:  Abrasians anterior shins bilaterally covered with foam dressings  Discharge Instructions      Discharge  Instructions   (HEART FAILURE PATIENTS) Call MD:  Anytime you have any of the following symptoms: 1) 3 pound weight gain in 24 hours or 5 pounds in 1 week 2) shortness of breath, with or without a dry hacking cough 3) swelling in the hands, feet or stomach 4) if you have to sleep on extra pillows at night in order to breathe.    Complete by:  As directed      Call MD for:  difficulty breathing, headache or visual disturbances    Complete by:  As directed      Call MD for:  extreme fatigue    Complete by:  As directed      Call MD for:  hives    Complete by:  As directed      Call MD for:  persistant dizziness or light-headedness    Complete by:  As directed      Call MD for:  persistant nausea and vomiting    Complete by:  As directed      Call MD for:  severe uncontrolled pain    Complete by:  As directed      Call MD for:  temperature >100.4    Complete by:  As directed      Diet - low sodium heart healthy    Complete by:  As directed      Diet Carb Modified    Complete by:  As directed      Increase activity slowly    Complete by:  As directed             Medication List    STOP taking these medications       olmesartan 40 MG tablet  Commonly known as:  BENICAR      TAKE these medications       Alcohol Swabs Pads  Apply 1 each topically daily.     alprazolam 2 MG tablet  Commonly known as:  XANAX  Take 2 tablets (4 mg total) by mouth at bedtime.     amLODipine 10 MG tablet  Commonly known as:  NORVASC  Take 1 tablet (10 mg total) by mouth daily.     carvedilol 6.25 MG tablet  Commonly known as:  COREG  Take 6.25 mg by mouth 2 (two) times daily with a meal.     citalopram 20 MG tablet  Commonly known as:  CELEXA  Take 1 tablet (20 mg total) by mouth daily.     cloNIDine 0.1 MG tablet  Commonly known as:  CATAPRES  Take 0.1 mg by mouth daily.     EASY COMFORT INSULIN SYRINGE 30G X 1/2" 1 ML Misc  Generic drug:  Insulin Syringe-Needle U-100      enoxaparin 150 MG/ML injection  Commonly known as:  LOVENOX  Inject 1 mL (150 mg total) into the skin every 12 (twelve) hours.     furosemide 20 MG tablet  Commonly known as:  LASIX  Take 3 tablets (60 mg total) by mouth daily.     insulin glargine 100 UNIT/ML injection  Commonly known as:  LANTUS  Inject 20 Units into the skin every morning.     losartan 100 MG tablet  Commonly known as:  COZAAR  Take 100 mg by mouth daily.     metoCLOPramide 5 MG tablet  Commonly known as:  REGLAN  Take 5 mg by mouth 3 (three) times daily.     multivitamin tablet  Take 1 tablet by mouth daily.     ONE TOUCH ULTRA 2 W/DEVICE Kit  Use to check blood sugars 2/day.     onetouch ultrasoft lancets  Use to check blood sugars 2/day.     oxyCODONE-acetaminophen 10-325 MG per tablet  Commonly known as:  PERCOCET  Take 1 tablet by mouth every 6 (six) hours as needed for pain.     potassium chloride SA 20 MEQ tablet  Commonly known as:  K-DUR,KLOR-CON  Take 40 mEq by mouth daily.     simvastatin 20 MG tablet  Commonly known as:  ZOCOR  Take 20 mg by mouth daily.     warfarin 10 MG tablet  Commonly known as:  COUMADIN  Take as directed by anticoagulation clinic       Follow-up Information   Follow up with Renato Shin, MD. Schedule an appointment as soon as possible for a visit in 1 month. (As needed)    Specialty:  Endocrinology   Contact information:   [address] [place] [address] [phone]       The results of significant diagnostics from this hospitalization (including imaging, microbiology, ancillary and laboratory) are listed below for  reference.    Significant Diagnostic Studies: Dg Chest Port 1 View  07/06/2013   CLINICAL DATA:  Weakness, shortness of breath in the morning, which improves throughout today, history of congestive heart failure, asthma, and hypertension, former smoker  EXAM: PORTABLE CHEST - 1 VIEW  COMPARISON:  04/26/2013  FINDINGS:  Tracheostomy tube projects over the tracheal air column. Moderate to severe enlargement of cardiac silhouette similar to prior study. Vascular pattern shows mild congestion. No edema or consolidation.  IMPRESSION: Significant cardiac enlargement.  No acute findings.   Electronically Signed   By: Skipper Cliche M.D.   On: 07/06/2013 11:06    Microbiology: Recent Results (from the past 240 hour(s))  MRSA PCR SCREENING     Status: None   Collection Time    07/06/13  5:16 PM      Result Value Ref Range Status   MRSA by PCR NEGATIVE  NEGATIVE Final   Comment:            The GeneXpert MRSA Assay (FDA     approved for NASAL specimens     only), is one component of a     comprehensive MRSA colonization     surveillance program. It is not     intended to diagnose MRSA     infection nor to guide or     monitor treatment for     MRSA infections.     Labs: Basic Metabolic Panel:  Recent Labs Lab 07/06/13 1129 07/07/13 0245  NA 141 138  K 4.5 3.7  CL 101 99  CO2 27 28  GLUCOSE 138* 148*  BUN 30* 22  CREATININE 1.72* 1.42*  CALCIUM 9.5 8.8   Liver Function Tests:  Recent Labs Lab 07/06/13 1129  AST 21  ALT 12  ALKPHOS 74  BILITOT 0.4  PROT 7.7  ALBUMIN 3.4*   No results found for this basename: LIPASE, AMYLASE,  in the last 168 hours No results found for this basename: AMMONIA,  in the last 168 hours CBC:  Recent Labs Lab 07/06/13 1129 07/07/13 0245  WBC 6.3 5.4  NEUTROABS 5.0  --   HGB 11.4* 10.9*  HCT 35.7* 33.7*  MCV 89.5 87.1  PLT 194 193   Cardiac Enzymes: No results found for this basename: CKTOTAL, CKMB, CKMBINDEX, TROPONINI,  in the last 168 hours BNP: BNP (last 3 results)  Recent Labs  11/24/12 1250 11/26/12 0600 03/10/13 1630  PROBNP 264.4* 596.6* 58.0   CBG:  Recent Labs Lab 07/06/13 1057 07/06/13 2035  GLUCAP 135* 173*    Time coordinating discharge: 45 minutes  Signed:  SHORT, MACKENZIE  [facility] 07/07/2013, 7:47  AM

## 2013-07-07 NOTE — Evaluation (Signed)
Physical Therapy Evaluation Patient Details Name: Travis Chapman MRN: [id number] DOB: March 02, 1945 Today's Date: 07/07/2013   History of Present Illness  Pt admit with generalized weakness following a fall at home.  Long term trach since 2006.    Clinical Impression  Pt admitted with the above. Pt currently with functional limitations due to the deficits listed below (see PT Problem List). At the time of PT eval pt was able to perform static standing at edge of chair with +2 assist, but was not able to take any steps. Pt states he does not wish to go to rehab which is far away from his home, but at this time feel pt would benefit from short term rehab at the SNF level prior to returning home. Pt will benefit from skilled PT to increase their independence and safety with mobility to allow discharge to the venue listed below.     Follow Up Recommendations SNF;Supervision for mobility/OOB    Equipment Recommendations  None recommended by PT    Recommendations for Other Services       Precautions / Restrictions Precautions Precautions: Fall Restrictions Weight Bearing Restrictions: No      Mobility  Bed Mobility               General bed mobility comments: Pt was in chair upon PT arrival. Pt declined bariatric chair that was delivered.   Transfers Overall transfer level: Needs assistance Equipment used: Rolling walker (2 wheeled) (bariatric) Transfers: Sit to/from Stand Sit to Stand: Mod assist;+2 physical assistance         General transfer comment: Pt required +2 assist to achieve full standing position at edge of chair. Was able to stand x3 holding 10-25 seconds each time. Pt not able to take any steps but was able to move LLE backwards towards chair. VC's for hand placement on seated surface for safety.   Ambulation/Gait                Stairs            Wheelchair Mobility    Modified Rankin (Stroke Patients Only)       Balance Overall balance  assessment: History of Falls;Needs assistance Sitting-balance support: Feet supported;Bilateral upper extremity supported Sitting balance-Leahy Scale: Fair     Standing balance support: Bilateral upper extremity supported Standing balance-Leahy Scale: Poor                               Pertinent Vitals/Pain Vitals stable throughout session. O2 dropped briefly during standing activity into low 80's, however was able to be improved with seated rest break.    Home Living Family/patient expects to be discharged to:: Private residence Living Arrangements: Parent Available Help at Discharge: Family;Available 24 hours/day Type of Home: House Home Access: Stairs to enter Entrance Stairs-Rails: Right;Left;Can reach both Entrance Stairs-Number of Steps: 3 Home Layout: One level Home Equipment: Walker - 2 wheels;Other (comment) Additional Comments: Pt wears vent at night time and has O2 just in case. Pt has been on vent for 8 years.     Prior Function Level of Independence: Independent with assistive device(s)         Comments: Pt reports 2 falls about 2 weeks apart.      Hand Dominance   Dominant Hand: Right    Extremity/Trunk Assessment   Upper Extremity Assessment: Defer to OT evaluation           Lower Extremity  Assessment: Generalized weakness      Cervical / Trunk Assessment: Kyphotic  Communication   Communication: Tracheostomy  Cognition Arousal/Alertness: Awake/alert Behavior During Therapy: WFL for tasks assessed/performed Overall Cognitive Status: Within Functional Limits for tasks assessed                      General Comments      Exercises General Exercises - Upper Extremity Shoulder Flexion: 10 reps General Exercises - Lower Extremity Long Arc Quad: 10 reps      Assessment/Plan    PT Assessment Patient needs continued PT services  PT Diagnosis Difficulty walking   PT Problem List Decreased strength  PT Treatment  Interventions DME instruction;Stair training;Gait training;Functional mobility training;Therapeutic activities;Therapeutic exercise;Neuromuscular re-education;Patient/family education   PT Goals (Current goals can be found in the Care Plan section) Acute Rehab PT Goals Patient Stated Goal: To return home, as pt does not want to go to rehab far away from his home.  PT Goal Formulation: With patient Time For Goal Achievement: 07/21/13 Potential to Achieve Goals: Fair    Frequency Min 2X/week   Barriers to discharge Decreased caregiver support Pt reports his mother is in her 33's and cannot assist him if he needs it at home. Apparently mother is doing all housekeeping, cooking, and laundry.     Co-evaluation               End of Session Equipment Utilized During Treatment: Gait belt Activity Tolerance: Patient limited by fatigue Patient left: in chair;with call bell/phone within reach;with family/visitor present Nurse Communication: Mobility status    Functional Assessment Tool Used: Clinical judgement Functional Limitation: Mobility: Walking and moving around Mobility: Walking and Moving Around Current Status (Z6109): At least 60 percent but less than 80 percent impaired, limited or restricted Mobility: Walking and Moving Around Goal Status [phone]): At least 60 percent but less than 80 percent impaired, limited or restricted    Time: 1410-1441 PT Time Calculation (min): 31 min   Charges:   PT Evaluation $Initial PT Evaluation Tier I: 1 Procedure PT Treatments $Therapeutic Activity: 23-37 mins   PT G Codes:   Functional Assessment Tool Used: Clinical judgement Functional Limitation: Mobility: Walking and moving around    [place] 07/07/2013, 4:06 PM  Jolyn Lent, PT, DPT Acute Rehabilitation Services Pager: [phone]

## 2013-07-07 NOTE — Progress Notes (Addendum)
[facility] has no beds and is not sure when one will become available.  Addendum: MD signed Phoebe Perch, and plan is for pt to discharge home with [place]. Social worker consult placed so home health social worker can follow up to get pt placed at [facility] or [facility] if he still would like vent SNF placement. RN and PT also consulted with agency. CSW provided contact information for admissions with [facility] and [facility] for home health social worker--clinicals sent to these facilities today. CSW signing off.  Ky Barban, MSW, [facility] Clinical Social Worker [phone]

## 2013-07-07 NOTE — Progress Notes (Signed)
ANTICOAGULATION CONSULT NOTE - Follow-up  Pharmacy Consult for Warfarin Indication: hx PE  No Known Allergies  Patient Measurements: Height: 6\' 5"  (195.6 cm) Weight: 368 lb 9.8 oz (167.2 kg) IBW/kg (Calculated) : 89.1  Vital Signs: Temp: 98.4 F (36.9 C) (06/22 1246) Temp src: Oral (06/22 1246) BP: 126/69 mmHg (06/22 1246) Pulse Rate: 66 (06/22 1246)  Labs:  Recent Labs  07/06/13 1129 07/06/13 1638 07/07/13 0245 07/07/13 1251  HGB 11.4*  --  10.9*  --   HCT 35.7*  --  33.7*  --   PLT 194  --  193  --   LABPROT  --  14.1  --  14.5  INR  --  1.11  --  1.15  CREATININE 1.72*  --  1.42*  --     Estimated Creatinine Clearance: 85.9 ml/min (by C-G formula based on Cr of 1.42).  Assessment: 68 yo M admitted 07/06/2013 with progressive weakness. She is on chronic coumadin for history of PE. However, her INR was essentially normal so she may not have been taking it. INR remains low at 1.15. She also continues on full-dose lovenox for anticoagulation. No bleeding noted.   Goal of Therapy:  INR 2-3   Plan:  1. Repeat warfarin 10mg  PO x 1 tonight 2. F/u AM INR  Salome Arnt, PharmD, BCPS Pager # [phone] 07/07/2013 1:30 PM

## 2013-07-07 NOTE — Progress Notes (Signed)
Utilization review completed.  

## 2013-07-08 LAB — GLUCOSE, CAPILLARY
GLUCOSE-CAPILLARY: 158 mg/dL — AB (ref 70–99)
Glucose-Capillary: 122 mg/dL — ABNORMAL HIGH (ref 70–99)

## 2013-07-08 LAB — PROTIME-INR
INR: 1.1 (ref 0.00–1.49)
Prothrombin Time: 14 seconds (ref 11.6–15.2)

## 2013-07-08 MED ORDER — WARFARIN SODIUM 2.5 MG PO TABS
12.5000 mg | ORAL_TABLET | Freq: Once | ORAL | Status: DC
  Filled 2013-07-08: qty 1

## 2013-07-08 NOTE — Progress Notes (Signed)
Utilization review completed.  

## 2013-07-08 NOTE — Progress Notes (Signed)
Due to confusion about whether patient was going home or to SNF yesterday, he was not discharged to home with home health services.  He feels well.  VSS, exam unchanged.  Will have SW eval SNF situation again this AM and if bed available, he may transfer, otherwise home today with [facility] services.  He WILL discharge today.

## 2013-07-08 NOTE — Progress Notes (Signed)
ANTICOAGULATION CONSULT NOTE - Follow-up  Pharmacy Consult for Warfarin Indication: hx PE  No Known Allergies  Patient Measurements: Height: 6\' 5"  (195.6 cm) Weight: 368 lb 9.8 oz (167.2 kg) IBW/kg (Calculated) : 89.1  Vital Signs: Temp: 98 F (36.7 C) (06/23 1159) Temp src: Oral (06/23 1159) BP: 134/71 mmHg (06/23 1159) Pulse Rate: 95 (06/23 1159)  Labs:  Recent Labs  07/06/13 1129 07/06/13 1638 07/07/13 0245 07/07/13 1251 07/08/13 0340  HGB 11.4*  --  10.9*  --   --   HCT 35.7*  --  33.7*  --   --   PLT 194  --  193  --   --   LABPROT  --  14.1  --  14.5 14.0  INR  --  1.11  --  1.15 1.10  CREATININE 1.72*  --  1.42*  --   --     Estimated Creatinine Clearance: 85.9 ml/min (by C-G formula based on Cr of 1.42).  Assessment: 29 YOM who continues on lovenox bridge to therapeutic INR with warfarin for hx PE (not new this admit). The patient's INR was 1.11 on admission on supposed PTA warfarin dose of 10 mg daily - likely not taking. The INR today remains SUBtherapeutic (INR 1.1 << 1.15, goal of 2-3). No new BMET - lovenox dose remains appropriate at this time.   Goal of Therapy:  INR 2-3   Plan:  1. Continue Lovenox 165 mg SQ every 12 hours 2. Warfarin 12.5 mg x 1 dose at 1800 today (if still here) 3. Will continue to monitor for any signs/symptoms of bleeding and will follow up with PT/INR in the a.m (if still here)  Alycia Rossetti, PharmD, BCPS Clinical Pharmacist Pager: [phone] 07/08/2013 1:14 PM

## 2013-07-08 NOTE — Progress Notes (Signed)
Removed patient from mechanical ventilation per patient request.  Patient on RA, no issues noted. RN aware.

## 2013-07-08 NOTE — Care Management Note (Signed)
    Page 1 of 1   07/08/2013     6:30:54 AM CARE MANAGEMENT NOTE 07/08/2013  Patient:  Travis Chapman, Travis Chapman   Account Number:  [id number]  Date Initiated:  07/07/2013  Documentation initiated by:  MAYO,HENRIETTA  Subjective/Objective Assessment:   dx weakness; on nocturnal vent, lives with mother     DC Planning Services  CM consult      Choice offered to / List presented to:  C-1 Patient      [place] arranged  HH-1 RN  [place].   Status of service:  Completed, signed off Medicare Important Message given?  NA - LOS <3 / Initial given by admissions  Discharge Disposition:  [place]  Per UR Regulation:  Reviewed for med. necessity/level of care/duration of stay  Comments:  07/07/13 [place] MSN BSN CCM Pt admitted due to fall, c/o generalized weakness.  Pt agrees to SNF for rehab but no vent SNF facilities in [place] have a bed.  Pt will d/c home with home health and CSW will provide FL2 and contact information to assist [facility] SW with placement if pt needs same.  Pt requests referral to [facility] as they provided services previously.

## 2013-07-08 NOTE — Progress Notes (Signed)
CSW received call from [facility] that they can offer pt a vent SNF bed today for rehab. RNCM offered this to pt; he declined, stating he wants to go home and his ride is here. CSW informed [facility]. Plan is for discharge home, at pt's request.   Ky Barban, MSW, [facility] Social Worker [phone]

## 2013-07-10 ENCOUNTER — Other Ambulatory Visit: Payer: Self-pay | Admitting: Endocrinology

## 2013-07-10 ENCOUNTER — Telehealth: Payer: Self-pay | Admitting: Endocrinology

## 2013-07-10 NOTE — Telephone Encounter (Signed)
Pt states that he needs lovenox refilled. Medication has not been prescribed be for please advise. Thanks!

## 2013-07-10 NOTE — Telephone Encounter (Signed)
Pt request phone call from the assistant concern about medication that he request refill on. Pt does not remember the name of it but he said that he take injection twice a day in his stomach. Please call pt [phone].

## 2013-07-11 MED ORDER — ENOXAPARIN SODIUM 150 MG/ML ~~LOC~~ SOLN: 150.0000 mg | Freq: Two times a day (BID) | SUBCUTANEOUS | Status: DC

## 2013-07-11 NOTE — Telephone Encounter (Signed)
Please refill lovenox x 5 days. Pt needs f/u in [facility] cou[facility]in clinic asap (today or Monday).

## 2013-07-11 NOTE — Telephone Encounter (Signed)
Noted, pt advised to follow up with coumadin clinic nurse ASAP. Pt states that he could not make it Monday, but he will one day next week.

## 2013-07-15 ENCOUNTER — Telehealth: Payer: Self-pay | Admitting: *Deleted

## 2013-07-15 NOTE — Telephone Encounter (Signed)
[facility] from [facility] wanted you to know that she will not be able to see patient until tomorrow, she has been unable to contact the patient until today.

## 2013-07-16 ENCOUNTER — Other Ambulatory Visit: Payer: Self-pay | Admitting: General Practice

## 2013-07-16 ENCOUNTER — Telehealth: Payer: Self-pay | Admitting: Endocrinology

## 2013-07-16 ENCOUNTER — Ambulatory Visit (INDEPENDENT_AMBULATORY_CARE_PROVIDER_SITE_OTHER): Payer: Medicare Other | Admitting: General Practice

## 2013-07-16 DIAGNOSIS — I2699 Other pulmonary embolism without acute cor pulmonale: Secondary | ICD-10-CM

## 2013-07-16 DIAGNOSIS — Z5181 Encounter for therapeutic drug level monitoring: Secondary | ICD-10-CM

## 2013-07-16 LAB — POCT INR: INR: 1.1

## 2013-07-16 MED ORDER — WARFARIN SODIUM 10 MG PO TABS: ORAL_TABLET | ORAL | Status: DC

## 2013-07-16 NOTE — Telephone Encounter (Signed)
Patient would like Dr. Loanne Drilling a rx of oxycontin   Thank You

## 2013-07-16 NOTE — Progress Notes (Signed)
Pre visit review using our clinic review tool, if applicable. No additional management support is needed unless otherwise documented below in the visit note. 

## 2013-07-16 NOTE — Telephone Encounter (Signed)
Noted  

## 2013-07-16 NOTE — Telephone Encounter (Signed)
Error

## 2013-07-17 MED ORDER — OXYCODONE-ACETAMINOPHEN 10-325 MG PO TABS: 1.0000 | ORAL_TABLET | Freq: Four times a day (QID) | ORAL | Status: DC | PRN

## 2013-07-17 NOTE — Telephone Encounter (Signed)
i printed 

## 2013-07-17 NOTE — Telephone Encounter (Signed)
Rx placed upfront. Pt advised the script is ready for pick up.

## 2013-07-17 NOTE — Telephone Encounter (Signed)
Pt would like a refill on his oxycodone. Please advise pt was last seen on 05/12/2013. Thanks!

## 2013-07-21 ENCOUNTER — Other Ambulatory Visit: Payer: Self-pay | Admitting: Endocrinology

## 2013-07-21 DIAGNOSIS — L97909 Non-pressure chronic ulcer of unspecified part of unspecified lower leg with unspecified severity: Secondary | ICD-10-CM

## 2013-07-21 DIAGNOSIS — E1049 Type 1 diabetes mellitus with other diabetic neurological complication: Secondary | ICD-10-CM

## 2013-07-21 DIAGNOSIS — I872 Venous insufficiency (chronic) (peripheral): Secondary | ICD-10-CM

## 2013-07-21 DIAGNOSIS — E1142 Type 2 diabetes mellitus with diabetic polyneuropathy: Secondary | ICD-10-CM

## 2013-07-23 NOTE — Telephone Encounter (Signed)
See below, Thanks!  

## 2013-07-23 NOTE — Telephone Encounter (Signed)
They have completed the skilled nursing at home, pt refused OT and had 1 visit with PT. Pt refused going to nursing home. He is noncompliant with checking his sugars and lower leg wounds are healed with no drainage. Patient was instructed to fu with wound care center-pt refused to make this appt to evaluate circulation in lower ext. [facility] is discharging pt with no further orders

## 2013-07-25 ENCOUNTER — Encounter (HOSPITAL_BASED_OUTPATIENT_CLINIC_OR_DEPARTMENT_OTHER): Payer: Medicare Other | Attending: General Surgery

## 2013-07-31 ENCOUNTER — Inpatient Hospital Stay (HOSPITAL_COMMUNITY)
Admission: EM | Admit: 2013-07-31 | Discharge: 2013-08-01 | DRG: 208 | Disposition: A | Discharge status: Home Health | Payer: Medicare Other | Attending: Emergency Medicine | Admitting: Emergency Medicine

## 2013-07-31 DIAGNOSIS — J45909 Unspecified asthma, uncomplicated: Secondary | ICD-10-CM

## 2013-07-31 DIAGNOSIS — E872 Acidosis, unspecified: Secondary | ICD-10-CM | POA: Diagnosis present

## 2013-07-31 DIAGNOSIS — G9340 Encephalopathy, unspecified: Secondary | ICD-10-CM | POA: Diagnosis present

## 2013-07-31 DIAGNOSIS — E7800 Pure hypercholesterolemia, unspecified: Secondary | ICD-10-CM

## 2013-07-31 DIAGNOSIS — M25519 Pain in unspecified shoulder: Secondary | ICD-10-CM

## 2013-07-31 DIAGNOSIS — E662 Morbid (severe) obesity with alveolar hypoventilation: Secondary | ICD-10-CM

## 2013-07-31 DIAGNOSIS — Z87891 Personal history of nicotine dependence: Secondary | ICD-10-CM

## 2013-07-31 DIAGNOSIS — R509 Fever, unspecified: Secondary | ICD-10-CM

## 2013-07-31 DIAGNOSIS — R809 Proteinuria, unspecified: Secondary | ICD-10-CM

## 2013-07-31 DIAGNOSIS — R0602 Shortness of breath: Secondary | ICD-10-CM | POA: Diagnosis not present

## 2013-07-31 DIAGNOSIS — M1990 Unspecified osteoarthritis, unspecified site: Secondary | ICD-10-CM

## 2013-07-31 DIAGNOSIS — R262 Difficulty in walking, not elsewhere classified: Secondary | ICD-10-CM

## 2013-07-31 DIAGNOSIS — R0600 Dyspnea, unspecified: Secondary | ICD-10-CM

## 2013-07-31 DIAGNOSIS — E1143 Type 2 diabetes mellitus with diabetic autonomic (poly)neuropathy: Secondary | ICD-10-CM

## 2013-07-31 DIAGNOSIS — D649 Anemia, unspecified: Secondary | ICD-10-CM

## 2013-07-31 DIAGNOSIS — J40 Bronchitis, not specified as acute or chronic: Secondary | ICD-10-CM | POA: Diagnosis present

## 2013-07-31 DIAGNOSIS — M25561 Pain in right knee: Secondary | ICD-10-CM

## 2013-07-31 DIAGNOSIS — H5440 Blindness, one eye, unspecified eye: Secondary | ICD-10-CM | POA: Diagnosis present

## 2013-07-31 DIAGNOSIS — Z8546 Personal history of malignant neoplasm of prostate: Secondary | ICD-10-CM

## 2013-07-31 DIAGNOSIS — R531 Weakness: Secondary | ICD-10-CM

## 2013-07-31 DIAGNOSIS — L97909 Non-pressure chronic ulcer of unspecified part of unspecified lower leg with unspecified severity: Secondary | ICD-10-CM

## 2013-07-31 DIAGNOSIS — J9610 Chronic respiratory failure, unspecified whether with hypoxia or hypercapnia: Secondary | ICD-10-CM

## 2013-07-31 DIAGNOSIS — E11319 Type 2 diabetes mellitus with unspecified diabetic retinopathy without macular edema: Secondary | ICD-10-CM | POA: Diagnosis present

## 2013-07-31 DIAGNOSIS — G4733 Obstructive sleep apnea (adult) (pediatric): Secondary | ICD-10-CM

## 2013-07-31 DIAGNOSIS — Z9911 Dependence on respirator [ventilator] status: Secondary | ICD-10-CM

## 2013-07-31 DIAGNOSIS — E1029 Type 1 diabetes mellitus with other diabetic kidney complication: Secondary | ICD-10-CM

## 2013-07-31 DIAGNOSIS — R29898 Other symptoms and signs involving the musculoskeletal system: Secondary | ICD-10-CM

## 2013-07-31 DIAGNOSIS — R002 Palpitations: Secondary | ICD-10-CM

## 2013-07-31 DIAGNOSIS — L97929 Non-pressure chronic ulcer of unspecified part of left lower leg with unspecified severity: Secondary | ICD-10-CM

## 2013-07-31 DIAGNOSIS — I509 Heart failure, unspecified: Secondary | ICD-10-CM | POA: Diagnosis present

## 2013-07-31 DIAGNOSIS — I493 Ventricular premature depolarization: Secondary | ICD-10-CM

## 2013-07-31 DIAGNOSIS — R0989 Other specified symptoms and signs involving the circulatory and respiratory systems: Secondary | ICD-10-CM

## 2013-07-31 DIAGNOSIS — Z794 Long term (current) use of insulin: Secondary | ICD-10-CM

## 2013-07-31 DIAGNOSIS — Z430 Encounter for attention to tracheostomy: Secondary | ICD-10-CM | POA: Diagnosis not present

## 2013-07-31 DIAGNOSIS — J9620 Acute and chronic respiratory failure, unspecified whether with hypoxia or hypercapnia: Secondary | ICD-10-CM | POA: Diagnosis present

## 2013-07-31 DIAGNOSIS — I5032 Chronic diastolic (congestive) heart failure: Secondary | ICD-10-CM

## 2013-07-31 DIAGNOSIS — L97919 Non-pressure chronic ulcer of unspecified part of right lower leg with unspecified severity: Secondary | ICD-10-CM

## 2013-07-31 DIAGNOSIS — K3184 Gastroparesis: Secondary | ICD-10-CM | POA: Diagnosis present

## 2013-07-31 DIAGNOSIS — Z930 Tracheostomy status: Secondary | ICD-10-CM

## 2013-07-31 DIAGNOSIS — I129 Hypertensive chronic kidney disease with stage 1 through stage 4 chronic kidney disease, or unspecified chronic kidney disease: Secondary | ICD-10-CM | POA: Diagnosis present

## 2013-07-31 DIAGNOSIS — I2699 Other pulmonary embolism without acute cor pulmonale: Secondary | ICD-10-CM

## 2013-07-31 DIAGNOSIS — C61 Malignant neoplasm of prostate: Secondary | ICD-10-CM

## 2013-07-31 DIAGNOSIS — I428 Other cardiomyopathies: Secondary | ICD-10-CM

## 2013-07-31 DIAGNOSIS — Z5181 Encounter for therapeutic drug level monitoring: Secondary | ICD-10-CM

## 2013-07-31 DIAGNOSIS — R0609 Other forms of dyspnea: Secondary | ICD-10-CM

## 2013-07-31 DIAGNOSIS — E1139 Type 2 diabetes mellitus with other diabetic ophthalmic complication: Secondary | ICD-10-CM | POA: Diagnosis present

## 2013-07-31 DIAGNOSIS — R0789 Other chest pain: Secondary | ICD-10-CM

## 2013-07-31 DIAGNOSIS — R9431 Abnormal electrocardiogram [ECG] [EKG]: Secondary | ICD-10-CM

## 2013-07-31 DIAGNOSIS — Z86711 Personal history of pulmonary embolism: Secondary | ICD-10-CM

## 2013-07-31 DIAGNOSIS — I10 Essential (primary) hypertension: Secondary | ICD-10-CM

## 2013-07-31 DIAGNOSIS — Z7901 Long term (current) use of anticoagulants: Secondary | ICD-10-CM

## 2013-07-31 DIAGNOSIS — R0902 Hypoxemia: Secondary | ICD-10-CM

## 2013-07-31 DIAGNOSIS — R635 Abnormal weight gain: Secondary | ICD-10-CM

## 2013-07-31 DIAGNOSIS — N189 Chronic kidney disease, unspecified: Secondary | ICD-10-CM | POA: Diagnosis present

## 2013-07-31 DIAGNOSIS — F411 Generalized anxiety disorder: Secondary | ICD-10-CM

## 2013-07-31 DIAGNOSIS — E875 Hyperkalemia: Secondary | ICD-10-CM | POA: Diagnosis present

## 2013-07-31 DIAGNOSIS — J9622 Acute and chronic respiratory failure with hypercapnia: Secondary | ICD-10-CM

## 2013-07-31 DIAGNOSIS — F1021 Alcohol dependence, in remission: Secondary | ICD-10-CM

## 2013-07-31 DIAGNOSIS — Z79899 Other long term (current) drug therapy: Secondary | ICD-10-CM

## 2013-07-31 DIAGNOSIS — E860 Dehydration: Secondary | ICD-10-CM

## 2013-07-31 DIAGNOSIS — N289 Disorder of kidney and ureter, unspecified: Secondary | ICD-10-CM

## 2013-08-01 ENCOUNTER — Encounter (HOSPITAL_COMMUNITY): Payer: Self-pay | Admitting: Emergency Medicine

## 2013-08-01 ENCOUNTER — Emergency Department (HOSPITAL_COMMUNITY): Payer: Medicare Other

## 2013-08-01 DIAGNOSIS — G4733 Obstructive sleep apnea (adult) (pediatric): Secondary | ICD-10-CM | POA: Diagnosis present

## 2013-08-01 DIAGNOSIS — E662 Morbid (severe) obesity with alveolar hypoventilation: Secondary | ICD-10-CM | POA: Diagnosis present

## 2013-08-01 DIAGNOSIS — Z930 Tracheostomy status: Secondary | ICD-10-CM

## 2013-08-01 DIAGNOSIS — Z87891 Personal history of nicotine dependence: Secondary | ICD-10-CM | POA: Diagnosis not present

## 2013-08-01 DIAGNOSIS — Z430 Encounter for attention to tracheostomy: Secondary | ICD-10-CM | POA: Diagnosis not present

## 2013-08-01 DIAGNOSIS — E7800 Pure hypercholesterolemia, unspecified: Secondary | ICD-10-CM | POA: Diagnosis present

## 2013-08-01 DIAGNOSIS — Z794 Long term (current) use of insulin: Secondary | ICD-10-CM | POA: Diagnosis not present

## 2013-08-01 DIAGNOSIS — E11319 Type 2 diabetes mellitus with unspecified diabetic retinopathy without macular edema: Secondary | ICD-10-CM | POA: Diagnosis present

## 2013-08-01 DIAGNOSIS — Z7901 Long term (current) use of anticoagulants: Secondary | ICD-10-CM | POA: Diagnosis not present

## 2013-08-01 DIAGNOSIS — R0602 Shortness of breath: Secondary | ICD-10-CM | POA: Diagnosis present

## 2013-08-01 DIAGNOSIS — J45909 Unspecified asthma, uncomplicated: Secondary | ICD-10-CM | POA: Diagnosis present

## 2013-08-01 DIAGNOSIS — E872 Acidosis, unspecified: Secondary | ICD-10-CM | POA: Diagnosis present

## 2013-08-01 DIAGNOSIS — M1990 Unspecified osteoarthritis, unspecified site: Secondary | ICD-10-CM | POA: Diagnosis present

## 2013-08-01 DIAGNOSIS — G9340 Encephalopathy, unspecified: Secondary | ICD-10-CM | POA: Diagnosis present

## 2013-08-01 DIAGNOSIS — J40 Bronchitis, not specified as acute or chronic: Secondary | ICD-10-CM | POA: Diagnosis present

## 2013-08-01 DIAGNOSIS — E1139 Type 2 diabetes mellitus with other diabetic ophthalmic complication: Secondary | ICD-10-CM | POA: Diagnosis present

## 2013-08-01 DIAGNOSIS — I5032 Chronic diastolic (congestive) heart failure: Secondary | ICD-10-CM | POA: Diagnosis present

## 2013-08-01 DIAGNOSIS — I509 Heart failure, unspecified: Secondary | ICD-10-CM | POA: Diagnosis present

## 2013-08-01 DIAGNOSIS — Z86711 Personal history of pulmonary embolism: Secondary | ICD-10-CM | POA: Diagnosis not present

## 2013-08-01 DIAGNOSIS — F411 Generalized anxiety disorder: Secondary | ICD-10-CM | POA: Diagnosis present

## 2013-08-01 DIAGNOSIS — J9620 Acute and chronic respiratory failure, unspecified whether with hypoxia or hypercapnia: Secondary | ICD-10-CM

## 2013-08-01 DIAGNOSIS — E875 Hyperkalemia: Secondary | ICD-10-CM | POA: Diagnosis present

## 2013-08-01 DIAGNOSIS — Z8546 Personal history of malignant neoplasm of prostate: Secondary | ICD-10-CM | POA: Diagnosis not present

## 2013-08-01 DIAGNOSIS — N189 Chronic kidney disease, unspecified: Secondary | ICD-10-CM | POA: Diagnosis present

## 2013-08-01 DIAGNOSIS — Z79899 Other long term (current) drug therapy: Secondary | ICD-10-CM | POA: Diagnosis not present

## 2013-08-01 DIAGNOSIS — H5440 Blindness, one eye, unspecified eye: Secondary | ICD-10-CM | POA: Diagnosis present

## 2013-08-01 DIAGNOSIS — I129 Hypertensive chronic kidney disease with stage 1 through stage 4 chronic kidney disease, or unspecified chronic kidney disease: Secondary | ICD-10-CM | POA: Diagnosis present

## 2013-08-01 DIAGNOSIS — N289 Disorder of kidney and ureter, unspecified: Secondary | ICD-10-CM | POA: Diagnosis present

## 2013-08-01 DIAGNOSIS — K3184 Gastroparesis: Secondary | ICD-10-CM | POA: Diagnosis present

## 2013-08-01 LAB — URINALYSIS, ROUTINE W REFLEX MICROSCOPIC
BILIRUBIN URINE: NEGATIVE
Glucose, UA: NEGATIVE mg/dL
Hgb urine dipstick: NEGATIVE
Ketones, ur: NEGATIVE mg/dL
Leukocytes, UA: NEGATIVE
NITRITE: NEGATIVE
Specific Gravity, Urine: 1.025 (ref 1.005–1.030)
UROBILINOGEN UA: 1 mg/dL (ref 0.0–1.0)
pH: 5.5 (ref 5.0–8.0)

## 2013-08-01 LAB — BASIC METABOLIC PANEL
ANION GAP: 11 (ref 5–15)
Anion gap: 9 (ref 5–15)
BUN: 29 mg/dL — ABNORMAL HIGH (ref 6–23)
BUN: 29 mg/dL — ABNORMAL HIGH (ref 6–23)
CALCIUM: 9.1 mg/dL (ref 8.4–10.5)
CO2: 26 mEq/L (ref 19–32)
CO2: 30 mEq/L (ref 19–32)
CREATININE: 1.88 mg/dL — AB (ref 0.50–1.35)
CREATININE: 1.89 mg/dL — AB (ref 0.50–1.35)
Calcium: 8.9 mg/dL (ref 8.4–10.5)
Chloride: 102 mEq/L (ref 96–112)
Chloride: 103 mEq/L (ref 96–112)
GFR calc Af Amer: 41 mL/min — ABNORMAL LOW (ref >90)
GFR calc non Af Amer: 35 mL/min — ABNORMAL LOW (ref >90)
GFR calc non Af Amer: 35 mL/min — ABNORMAL LOW (ref >90)
GFR, EST AFRICAN AMERICAN: 41 mL/min — AB (ref >90)
Glucose, Bld: 148 mg/dL — ABNORMAL HIGH (ref 70–99)
Glucose, Bld: 156 mg/dL — ABNORMAL HIGH (ref 70–99)
Potassium: 5.8 mEq/L — ABNORMAL HIGH (ref 3.7–5.3)
Potassium: 5.1 mEq/L (ref 3.7–5.3)
Sodium: 139 mEq/L (ref 137–147)
Sodium: 142 mEq/L (ref 137–147)

## 2013-08-01 LAB — CBC
HCT: 37.1 % — ABNORMAL LOW (ref 39.0–52.0)
Hemoglobin: 11.7 g/dL — ABNORMAL LOW (ref 13.0–17.0)
MCH: 28.1 pg (ref 26.0–34.0)
MCHC: 31.5 g/dL (ref 30.0–36.0)
MCV: 89 fL (ref 78.0–100.0)
Platelets: 168 10*3/uL (ref 150–400)
RBC: 4.17 MIL/uL — AB (ref 4.22–5.81)
RDW: 13.4 % (ref 11.5–15.5)
WBC: 6.3 10*3/uL (ref 4.0–10.5)

## 2013-08-01 LAB — CBC WITH DIFFERENTIAL/PLATELET
BASOS ABS: 0 10*3/uL (ref 0.0–0.1)
Basophils Relative: 0 % (ref 0–1)
Eosinophils Absolute: 0.1 10*3/uL (ref 0.0–0.7)
Eosinophils Relative: 1 % (ref 0–5)
HCT: 39.4 % (ref 39.0–52.0)
Hemoglobin: 12.3 g/dL — ABNORMAL LOW (ref 13.0–17.0)
LYMPHS ABS: 0.9 10*3/uL (ref 0.7–4.0)
Lymphocytes Relative: 14 % (ref 12–46)
MCH: 28.1 pg (ref 26.0–34.0)
MCHC: 31.2 g/dL (ref 30.0–36.0)
MCV: 90.2 fL (ref 78.0–100.0)
MONO ABS: 0.5 10*3/uL (ref 0.1–1.0)
MONOS PCT: 8 % (ref 3–12)
NEUTROS ABS: 4.7 10*3/uL (ref 1.7–7.7)
Neutrophils Relative %: 77 % (ref 43–77)
PLATELETS: 150 10*3/uL (ref 150–400)
RBC: 4.37 MIL/uL (ref 4.22–5.81)
RDW: 13.4 % (ref 11.5–15.5)
WBC: 6.2 10*3/uL (ref 4.0–10.5)

## 2013-08-01 LAB — I-STAT ARTERIAL BLOOD GAS, ED
Acid-Base Excess: 2 mmol/L (ref 0.0–2.0)
Acid-Base Excess: 4 mmol/L — ABNORMAL HIGH (ref 0.0–2.0)
Bicarbonate: 31.1 mEq/L — ABNORMAL HIGH (ref 20.0–24.0)
Bicarbonate: 30.7 mEq/L — ABNORMAL HIGH (ref 20.0–24.0)
O2 Saturation: 91 %
O2 Saturation: 94 %
PCO2 ART: 57.2 mmHg — AB (ref 35.0–45.0)
PH ART: 7.243 — AB (ref 7.350–7.450)
PO2 ART: 74 mmHg — AB (ref 80.0–100.0)
Patient temperature: 98.6
Patient temperature: 98.6
TCO2: 33 mmol/L (ref 0–100)
TCO2: 32 mmol/L (ref 0–100)
pCO2 arterial: 72.1 mmHg (ref 35.0–45.0)
pH, Arterial: 7.338 — ABNORMAL LOW (ref 7.350–7.450)
pO2, Arterial: 78 mmHg — ABNORMAL LOW (ref 80.0–100.0)

## 2013-08-01 LAB — CBG MONITORING, ED
GLUCOSE-CAPILLARY: 130 mg/dL — AB (ref 70–99)
Glucose-Capillary: 138 mg/dL — ABNORMAL HIGH (ref 70–99)

## 2013-08-01 LAB — URINE MICROSCOPIC-ADD ON

## 2013-08-01 LAB — I-STAT CG4 LACTIC ACID, ED: Lactic Acid, Venous: 0.98 mmol/L (ref 0.5–2.2)

## 2013-08-01 LAB — TROPONIN I: Troponin I: <0.3 ng/mL (ref <0.30)

## 2013-08-01 LAB — PHOSPHORUS: Phosphorus: 2.7 mg/dL (ref 2.3–4.6)

## 2013-08-01 LAB — PRO B NATRIURETIC PEPTIDE: Pro B Natriuretic peptide (BNP): 265.8 pg/mL — ABNORMAL HIGH (ref 0–125)

## 2013-08-01 LAB — PROTIME-INR
INR: 4.52 — AB (ref 0.00–1.49)
Prothrombin Time: 42.9 seconds — ABNORMAL HIGH (ref 11.6–15.2)

## 2013-08-01 LAB — MAGNESIUM: Magnesium: 2.2 mg/dL (ref 1.5–2.5)

## 2013-08-01 MED ORDER — INSULIN ASPART 100 UNIT/ML ~~LOC~~ SOLN
0.0000 [IU] | SUBCUTANEOUS | Status: DC
  Administered 2013-08-01: 3 [IU] via SUBCUTANEOUS
  Filled 2013-08-01: qty 1

## 2013-08-01 MED ORDER — SIMVASTATIN 20 MG PO TABS
20.0000 mg | ORAL_TABLET | Freq: Every day | ORAL | Status: DC
Start: 2013-08-01 — End: 2013-08-01

## 2013-08-01 MED ORDER — DOXYCYCLINE HYCLATE 100 MG PO CAPS: 100.0000 mg | ORAL_CAPSULE | Freq: Two times a day (BID) | ORAL | Status: DC

## 2013-08-01 MED ORDER — ENOXAPARIN SODIUM 150 MG/ML ~~LOC~~ SOLN
150.0000 mg | Freq: Two times a day (BID) | SUBCUTANEOUS | Status: DC
  Filled 2013-08-01 (×3): qty 1

## 2013-08-01 MED ORDER — CARVEDILOL 6.25 MG PO TABS: 6.2500 mg | ORAL_TABLET | Freq: Two times a day (BID) | ORAL | Status: DC

## 2013-08-01 MED ORDER — FUROSEMIDE 20 MG PO TABS: 60.0000 mg | ORAL_TABLET | Freq: Every day | ORAL | Status: DC

## 2013-08-01 MED ORDER — OXYCODONE-ACETAMINOPHEN 10-325 MG PO TABS: 1.0000 | ORAL_TABLET | Freq: Four times a day (QID) | ORAL | Status: DC | PRN

## 2013-08-01 MED ORDER — OXYCODONE HCL 5 MG PO TABS: 5.0000 mg | ORAL_TABLET | Freq: Four times a day (QID) | ORAL | Status: DC | PRN

## 2013-08-01 MED ORDER — SODIUM CHLORIDE 0.9 % IV SOLN: 250.0000 mL | INTRAVENOUS | Status: DC | PRN

## 2013-08-01 MED ORDER — WARFARIN - PHARMACIST DOSING INPATIENT: Freq: Every day | Status: DC

## 2013-08-01 MED ORDER — WARFARIN SODIUM 5 MG PO TABS: 7.5000 mg | ORAL_TABLET | Freq: Every day | ORAL | Status: DC

## 2013-08-01 MED ORDER — HEPARIN SODIUM (PORCINE) 5000 UNIT/ML IJ SOLN: 5000.0000 [IU] | Freq: Three times a day (TID) | INTRAMUSCULAR | Status: DC

## 2013-08-01 MED ORDER — OXYCODONE-ACETAMINOPHEN 5-325 MG PO TABS
1.0000 | ORAL_TABLET | Freq: Four times a day (QID) | ORAL | Status: DC | PRN
  Administered 2013-08-01: 1 via ORAL
  Filled 2013-08-01: qty 1

## 2013-08-01 MED ORDER — AMLODIPINE BESYLATE 5 MG PO TABS: 10.0000 mg | ORAL_TABLET | Freq: Every day | ORAL | Status: DC

## 2013-08-01 MED ORDER — LOSARTAN POTASSIUM 50 MG PO TABS: 100.0000 mg | ORAL_TABLET | Freq: Every day | ORAL | Status: DC

## 2013-08-01 MED ORDER — SODIUM CHLORIDE 0.9 % IV SOLN
INTRAVENOUS | Status: DC
  Administered 2013-08-01: 75 mL/h via INTRAVENOUS

## 2013-08-01 MED ORDER — METOCLOPRAMIDE HCL 5 MG PO TABS: 5.0000 mg | ORAL_TABLET | Freq: Three times a day (TID) | ORAL | Status: DC

## 2013-08-01 MED ORDER — SODIUM POLYSTYRENE SULFONATE 15 GM/60ML PO SUSP: 30.0000 g | Freq: Once | ORAL | Status: DC

## 2013-08-01 MED ORDER — CLONIDINE HCL 0.1 MG PO TABS: 0.1000 mg | ORAL_TABLET | Freq: Every day | ORAL | Status: DC

## 2013-08-01 MED ORDER — CITALOPRAM HYDROBROMIDE 10 MG PO TABS: 20.0000 mg | ORAL_TABLET | Freq: Every day | ORAL | Status: DC

## 2013-08-01 NOTE — ED Notes (Signed)
Pt resting, arousable to voice, appears lethargic, interactive, calm, NAD, states, "feels better", (denies sob or pain at this time). NSR on monitor, HR 75. Remains on high flow, humidified O2 by trache collar. SPO2 100%. (denies: feeling hot or cold or nausea)

## 2013-08-01 NOTE — ED Notes (Signed)
[facility] called for transport.  

## 2013-08-01 NOTE — Discharge Summary (Signed)
Physician Discharge Summary  Patient ID: Travis Chapman MRN: [id number] DOB/AGE: [birth year] 67 y.o.  Admit date: 07/31/2013 Discharge date: 08/01/2013    Discharge Diagnoses:  Acute on Chronic Respiratory Failure Tracheostomy Dependent Acute Hypercarbia Respiratory Acidosis  Recurrent PE Tracheostomy Obstruction / Tracheobronchitis CHF with preserved EF  HTN CKD Hyperkalemia  Gastroparesis Diabetes Mellitus Acute Encephalopathy                                                                      DISCHARGE PLAN BY DIAGNOSIS     Acute on Chronic Respiratory Failure Tracheostomy Dependent Acute Hypercarbia Respiratory Acidosis  Recurrent PE - last dx in June, INR subtherapeutic (poor compliance w anticoagulation)  Tracheostomy Obstruction / Trachobronchitis  Discharge Plan: Continue nocturnal vent support + PRN Sleep Arrange for home RT, RN to assist with coumadin adjustment & assessment of home for needs Hold coumadin 7/17, resume 7/18 at reduced dose (7.5 mg until seen by PCP) ? Whether he would be a candidate for alternative anticoagulant that would not require monitoring  Follow up INR with with PCP on Tues 7/21 Daily trach care with inner cannula change Doxycycline for 5 days    CHF - with preserved EF, grade 1 diastolic dysfunction, EF 40-10% in April 2015.  No acute exacerbation HTN   Discharge Plan: Continue home losartan, norvasc, coreg, lasix    CKD - baseline creatinine 1.3-2, 1.88 on admission  Hyperkalemia - 5.8 on admission, reduced to 5.1   Discharge Plan: Consider follow up BMP on 7/21 to review sr cr    Gastroparesis   Discharge Plan: Continue home reglan  Resume heart healthy carb modified diet   Diabetes, A1c 6.6 in April   Discharge Plan: Resume home insulin regimen  Acute Encephalopathy - likely secondary to hypercarbia, much improved since being placed on vent   Discharge Plan: Resolved, no acute follow up.   Ensure compliance  with nocturnal vent & proper trach care                  DISCHARGE SUMMARY   Travis Chapman is a 68 y.o. y/o male with a PMH chronic trach for OSA/OHS who presented to [facility] ER on 7/17 early am with increased shortness of breath prompting his family to call EMS.  On arrival, EMS found him with a clogged trach full of yellow sputum but were unable to suction it. The inner canula was changed. After this he was somewhat improved but still somnolent and unable to ambulate so he was brought in to the ED. In the ED he was found to be hypercarbic and placed on a vent, which he normally uses at night.   Pt denied remembering any changes leading up to his shortness of breath but did endorse some increased swelling in his lower extremities and abdomen. Work up noted the patient to have acute on chronic renal insufficiency, mild hyperkalemia and supratherapeutic INR.  Patient was placed on mechanical ventilation with improvement in mental status.  Home coumadin dosing was held given INR of 4.52.  Patient was reassessed am of 7/17 and requested to return home.  He will be treated for 5 days for tracheobronchitis with doxycycline.  Coumadin plan adjusted for home.  See discharge plans as  above for details.      SIGNIFICANT EVENTS / STUDIES:  7/17 - Pt presented after trach change with hypercarbia, placed on vent   LINES / TUBES:  Trach - chronic    Discharge Exam: General: Obese man with trach.  Up in chair.  Neuro: AAOx4, speech clear when covering trach. no focal deficits noted  HEENT: PERRL, MMM, EOMI  Cardiovascular: RRR, no MRG, LE edema (difficult to assess degree dt habitus)  Lungs: scattered rhonchi, no wheezing, normal WOB, trach dependent  Abdomen: Soft, NTND  Musculoskeletal: grossly normal  Skin: No rashes or skin breakdown noted   Filed Vitals:   08/01/13 0805 08/01/13 0842 08/01/13 0945 08/01/13 1044  BP: 153/110  154/99 154/77  Pulse: 86  82 85  Temp:    99 F (37.2 C)   TempSrc:    Oral  Resp: _0 SpO2: 98% 95% 95% 97%    Discharge Labs  BMET  Recent Labs Lab 08/01/13 0154 08/01/13 0452  NA 139 142  K 5.8* 5.1  CL 102 103  CO2 26 30  GLUCOSE 148* 156*  BUN 29* 29*  CREATININE 1.88* 1.89*  CALCIUM 9.1 8.9  MG  --  2.2  PHOS  --  2.7    CBC  Recent Labs Lab 08/01/13 0154 08/01/13 0452  HGB 12.3* 11.7*  HCT 39.4 37.1*  WBC 6.2 6.3  PLT 150 168   Anti-Coagulation  Recent Labs Lab 08/01/13 0452  INR 4.52*    Discharge Instructions   Call MD for:  difficulty breathing, headache or visual disturbances    Complete by:  As directed      Call MD for:  hives    Complete by:  As directed      Call MD for:  persistant dizziness or light-headedness    Complete by:  As directed      Call MD for:  persistant nausea and vomiting    Complete by:  As directed      Call MD for:  severe uncontrolled pain    Complete by:  As directed      Call MD for:  temperature >100.4    Complete by:  As directed      Diet - low sodium heart healthy    Complete by:  As directed      Discharge instructions    Complete by:  As directed   1. Take your antibiotic as prescribed 2. Resume your previous home medications.   3. Follow up with Dr. Loanne Drilling on Tues to review your INR (coumadin level) and hospital follow up 4. Resume home vent as previously prescribed.   5. Off vent during day 6. Perform Trach care daily with inner cannula change     Increase activity slowly    Complete by:  As directed               Follow-up Information   Follow up with Renato Shin, MD On 08/05/2013. (Appt at 3:45 for hospital follow up )    Specialty:  Endocrinology   Contact information:   [address] [place] [address] [phone]         Medication List         Alcohol Swabs Pads  Apply 1 each topically daily.     alprazolam 2 MG tablet  Commonly known as:  XANAX  Take 2 tablets (4 mg total) by mouth at bedtime.  amLODipine 10 MG tablet  Commonly known as:  NORVASC  Take 1 tablet (10 mg total) by mouth daily.     carvedilol 6.25 MG tablet  Commonly known as:  COREG  Take 6.25 mg by mouth 2 (two) times daily with a meal.     citalopram 20 MG tablet  Commonly known as:  CELEXA  Take 1 tablet (20 mg total) by mouth daily.     cloNIDine 0.1 MG tablet  Commonly known as:  CATAPRES  Take 0.1 mg by mouth 2 (two) times daily.     doxycycline 100 MG capsule  Commonly known as:  VIBRAMYCIN  Take 1 capsule (100 mg total) by mouth 2 (two) times daily.     EASY COMFORT INSULIN SYRINGE 30G X 1/2" 1 ML Misc  Generic drug:  Insulin Syringe-Needle U-100     furosemide 20 MG tablet  Commonly known as:  LASIX  Take 3 tablets (60 mg total) by mouth daily.     insulin glargine 100 UNIT/ML injection  Commonly known as:  LANTUS  Inject 20 Units into the skin every morning.     losartan 100 MG tablet  Commonly known as:  COZAAR  Take 100 mg by mouth daily.     metoCLOPramide 5 MG tablet  Commonly known as:  REGLAN  Take 5 mg by mouth 3 (three) times daily.     multivitamin tablet  Take 1 tablet by mouth daily.     ONE TOUCH ULTRA 2 W/DEVICE Kit  Use to check blood sugars 2/day.     onetouch ultrasoft lancets  Use to check blood sugars 2/day.     oxyCODONE-acetaminophen 10-325 MG per tablet  Commonly known as:  PERCOCET  Take 1 tablet by mouth every 6 (six) hours as needed for pain.     potassium chloride SA 20 MEQ tablet  Commonly known as:  K-DUR,KLOR-CON  Take 40 mEq by mouth daily.     simvastatin 20 MG tablet  Commonly known as:  ZOCOR  Take 20 mg by mouth daily.     warfarin 5 MG tablet  Commonly known as:  COUMADIN  Take 1.5 tablets (7.5 mg total) by mouth daily.  Start taking on:  08/02/2013        Disposition:  Home.  Home RT, RN arranged prior to discharge.   Discharged Condition: Delan Ksiazek has met maximum benefit of inpatient care and is medically stable and cleared  for discharge.  Patient is pending follow up as above.      Time spent on disposition:  Greater than 35 minutes.   Signed: Noe Gens, NP-C [facility] Pgr: [phone] Office: [phone]

## 2013-08-01 NOTE — ED Notes (Signed)
Pt verbalizing wanting to go home

## 2013-08-01 NOTE — ED Notes (Signed)
PCCM at [facility], pt resting on vent to trache, VSS.

## 2013-08-01 NOTE — ED Notes (Addendum)
No changes, pt alert, NAD, calm, interactive, no dyspnea noted, pt disconnected vent for comfort, "wants to sit in chair and not be on vent at this time", RT and PCCM Dr. Lamonte Sakai notified. VSS. Potassium WNL. Not transferred to chair at this time explained with rationale.

## 2013-08-01 NOTE — Discharge Summary (Signed)
Reviewed.  Hermena Swint, MD Pulmonary and Critical Care Medicine [facility] Pager: [phone]  

## 2013-08-01 NOTE — ED Notes (Signed)
Pt is from home. EMS called out for SOB. Upon EMS arrival, pt's trach clogged with yellow sputum, and pt was very lethargic. EMS attempted to suction the pt's trach with no success, and so they changed the trach. EMS reports that the pt's mentation improved with the change of the trach and suctioning. Pt arrived on a non-rebreather, with a gurgling sound coming from his trach. Pt could not ambulate to his bedroom by himself, so EMS brought him to the hospital. Pt reports increased swelling in his abdomen and legs. Pt uses a ventilator at night.

## 2013-08-01 NOTE — Discharge Instructions (Signed)
1. Take your antibiotic as prescribed 2. Resume your previous home medications - see coumadin instructions below.  3. Follow up with Dr. Loanne Drilling on Tues to review your INR (coumadin level) and hospital follow up 4. Resume home vent as previously prescribed.   5. Off vent during day 6. Perform Trach care daily with inner cannula change. 7. Do not take coumadin on 7/17.  Resume coumadin 7.5 mg on 7/18.  You have a new coumadin prescription at the pharmacy for 5mg  tablets.  You will take 1 1/2 tablet (7.5 mg) daily until seen by Dr. Loanne Drilling.

## 2013-08-01 NOTE — ED Notes (Signed)
Pending INR, delay of lovenox injection (per pharm)

## 2013-08-01 NOTE — ED Notes (Signed)
PCCM at the bedside to discharge the patient. Discharge orders being printed. Pt states that he has a ride coming to pick him up.

## 2013-08-01 NOTE — ED Notes (Signed)
PCCM at the bedside 

## 2013-08-01 NOTE — Progress Notes (Signed)
ANTICOAGULATION CONSULT NOTE - Initial Consult  Pharmacy Consult for Coumadin Indication: h/o PE  No Known Allergies   Vital Signs: BP: 158/90 mmHg (07/17 0300) Pulse Rate: 87 (07/17 0300)  Labs:  Recent Labs  08/01/13 0154  HGB 12.3*  HCT 39.4  PLT 150  CREATININE 1.88*  TROPONINI <0.30    Medical History: Past Medical History  Diagnosis Date  . ANXIETY 07/23/2006  . ASTHMA 07/23/2006  . DM 05/23/2007  . HYPERTENSION 07/23/2006  . OSTEOARTHRITIS 07/23/2006  . HYPERCHOLESTEROLEMIA 05/23/2007  . ANEMIA 08/31/2008  . Palpitations 02/10/2008  . ALCOHOL ABUSE, IN REMISSION, HX OF 08/26/2008  . TOBACCO ABUSE, HX OF 08/26/2008  . Pulmonary embolism 03/16/2010  . ED (erectile dysfunction)   . Morbid obesity   . DM retinopathy   . 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 DM neuropathy with neurologic complication   . CHF (congestive heart failure)     With a preserved EF  . 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 Sleep apnea   . Shortness of breath   . CARCINOMA, PROSTATE 09/29/2009    Assessment: 68yo male c/o SOB w/ yellow sputum in trach, felt better after trach changed, admitted w/ hypercarbia, improvement when placed on vent, to continue Coumadin for h/o PE.  Goal of Therapy:  INR 2-3   Plan:  Will obtain current INR prior to dosing Coumadin.  Wynona Neat, PharmD, BCPS  08/01/2013,4:00 AM

## 2013-08-01 NOTE — ED Notes (Addendum)
Pt repositioned for comfort. Pending potassium and INR. No changes. VSS.

## 2013-08-01 NOTE — H&P (Signed)
PULMONARY / CRITICAL CARE MEDICINE   Name: Hani Campusano MRN: [id number] DOB: 06-Jun-1945    ADMISSION DATE:  07/31/2013 CONSULTATION DATE:  08/01/2013  REFERRING MD:  Roxanne Mins PRIMARY SERVICE: PCCM  CHIEF COMPLAINT:  Shortness of breath  BRIEF PATIENT DESCRIPTION: Pt is a 68yo male with chronic trach requiring nocturnal vent who presents with shortness of breath, found to be hypercarbic and requiring change of clogged trach by EMS.  SIGNIFICANT EVENTS / STUDIES:  7/17 - Pt presented after trach change with hypercarbia, placed on vent  LINES / TUBES: Trach - chronic PIVs  CULTURES: None  ANTIBIOTICS: None  HISTORY OF PRESENT ILLNESS:  Pt is a 68yo with chronic trach for OSA/OHS who was short of breath this evening so his family called EMS. EMS found him with a clogged trach full of yellow sputum but were unable to suction it. Apparently inner canula was changed. After this he was somewhat improved but still somnolent and unable to ambulate so he was brought in to the ED. In the ED he was found to be hypercarbic and placed on a vent, which he normally uses at night.  Pt denies remembering any changes leading up to his shortness of breath but does endorse some increased swelling in his lower extremities and abdomen. He reports feeling better on the vent but still tired.  PAST MEDICAL HISTORY :  Past Medical History  Diagnosis Date  . ANXIETY 07/23/2006  . ASTHMA 07/23/2006  . DM 05/23/2007  . HYPERTENSION 07/23/2006  . OSTEOARTHRITIS 07/23/2006  . HYPERCHOLESTEROLEMIA 05/23/2007  . ANEMIA 08/31/2008  . Palpitations 02/10/2008  . ALCOHOL ABUSE, IN REMISSION, HX OF 08/26/2008  . TOBACCO ABUSE, HX OF 08/26/2008  . Pulmonary embolism 03/16/2010  . ED (erectile dysfunction)   . Morbid obesity   . DM retinopathy   . 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 DM neuropathy with neurologic complication   . CHF (congestive heart failure)     With a preserved EF  . Cough  secondary to angiotensin converting enzyme inhibitor (ACE-I)     Cough due to Zestril  . Sleep apnea   . Shortness of breath   . CARCINOMA, PROSTATE 09/29/2009   Past Surgical History  Procedure Laterality Date  . Back surgery    . Tracheostomy     Prior to Admission medications   Medication Sig Start Date End Date Taking? Authorizing Provider  Alcohol Swabs PADS Apply 1 each topically daily.    Historical Provider, MD  alprazolam Duanne Moron) 2 MG tablet Take 2 tablets (4 mg total) by mouth at bedtime. 07/07/13   Janece Canterbury, MD  amLODipine (NORVASC) 10 MG tablet Take 1 tablet (10 mg total) by mouth daily. 07/04/13   Renato Shin, MD  Blood Glucose Monitoring Suppl (ONE TOUCH ULTRA 2) W/DEVICE KIT Use to check blood sugars 2/day. 04/10/13   Renato Shin, MD  carvedilol (COREG) 6.25 MG tablet Take 6.25 mg by mouth 2 (two) times daily with a meal.    Historical Provider, MD  citalopram (CELEXA) 20 MG tablet Take 1 tablet (20 mg total) by mouth daily. 04/30/13   Cherene Altes, MD  cloNIDine (CATAPRES) 0.1 MG tablet Take 0.1 mg by mouth daily. 02/12/13   Renato Shin, MD  cloNIDine (CATAPRES) 0.1 MG tablet TAKE ONE TABLET BY MOUTH TWICE DAILY 07/21/13   Renato Shin, MD  EASY COMFORT INSULIN SYRINGE 30G X 1/2" 1 ML MISC  11/25/12  Historical Provider, MD  enoxaparin (LOVENOX) 150 MG/ML injection Inject 1 mL (150 mg total) into the skin every 12 (twelve) hours. 07/11/13   Renato Shin, MD  furosemide (LASIX) 20 MG tablet Take 3 tablets (60 mg total) by mouth daily. 07/07/13   Janece Canterbury, MD  insulin glargine (LANTUS) 100 UNIT/ML injection Inject 20 Units into the skin every morning. 01/13/13   Renato Shin, MD  KLOR-CON M20 20 MEQ tablet TAKE TWO TABLETS BY MOUTH ONCE DAILY 07/10/13   Renato Shin, MD  KLOR-CON M20 20 MEQ tablet TAKE TWO TABLETS BY MOUTH ONCE DAILY 07/21/13   Renato Shin, MD  Lancets [facility] ULTRASOFT) lancets Use to check blood sugars 2/day. 04/10/13   Renato Shin, MD  LANTUS  100 UNIT/ML injection INJECT 25 UNITS SUBCUTANEOUSLY EVERY MORNING 07/21/13   Renato Shin, MD  losartan (COZAAR) 100 MG tablet Take 100 mg by mouth daily. 05/27/13   Historical Provider, MD  metoCLOPramide (REGLAN) 5 MG tablet Take 5 mg by mouth 3 (three) times daily.    Historical Provider, MD  Multiple Vitamin (MULTIVITAMIN) tablet Take 1 tablet by mouth daily.     Historical Provider, MD  oxyCODONE-acetaminophen (PERCOCET) 10-325 MG per tablet Take 1 tablet by mouth every 6 (six) hours as needed for pain. 07/17/13   Renato Shin, MD  potassium chloride SA (K-DUR,KLOR-CON) 20 MEQ tablet Take 40 mEq by mouth daily.    Historical Provider, MD  simvastatin (ZOCOR) 20 MG tablet Take 20 mg by mouth daily.    Historical Provider, MD  warfarin (COUMADIN) 10 MG tablet Take as directed by anticoagulation clinic 07/16/13   Renato Shin, MD   No Known Allergies  FAMILY HISTORY:  Family History  Problem Relation Age of Onset  . Cancer Neg Hx    SOCIAL HISTORY:  reports that he quit smoking about 8 years ago. His smoking use included Cigarettes. He has a 7.5 pack-year smoking history. He does not have any smokeless tobacco history on file. He reports that he does not drink alcohol. His drug history is not on file.  REVIEW OF SYSTEMS:  Unable to complete due to patient condition.  SUBJECTIVE: Comfortable on vent, tired  VITAL SIGNS: Pulse Rate:  [72-88] 84 (07/17 0241) Resp:  [17-24] 24 (07/17 0241) BP: (110-162)/(76-97) 146/86 mmHg (07/17 0241) SpO2:  [96 %-100 %] 96 % (07/17 0230) FiO2 (%):  [35 %-70 %] 70 % (07/17 0241) HEMODYNAMICS:   VENTILATOR SETTINGS: Vent Mode:  [-] PRVC FiO2 (%):  [35 %-70 %] 70 % Set Rate:  [22 bmp] 22 bmp Vt Set:  [580 mL] 580 mL PEEP:  [5 cmH20] 5 cmH20 Plateau Pressure:  [28 cmH20] 28 cmH20  INTAKE / OUTPUT: Intake/Output     07/16 0701 - 07/17 0700   Urine 275   Total Output 275   Net -275         PHYSICAL EXAMINATION: General:  Obese man with trach and  vent, comfortable but sleepy Neuro: alert and oriented, no focal deficits noted   HEENT:  PERRL, MMM, EOMI Cardiovascular:  RRR, no MRG, LE edema (difficult to assess degree dt habitus) Lungs: scattered rhonchi, no wheezing, normal WOB, trach dependent Abdomen:  Soft, NTND Musculoskeletal: grossly normal Skin:  No rashes or skin breakdown noted  LABS:  CBC  Recent Labs Lab 08/01/13 0154  WBC 6.2  HGB 12.3*  HCT 39.4  PLT 150   Coag's No results found for this basename: APTT, INR,  in the last 168 hours  BMET  Recent Labs Lab 08/01/13 0154  NA 139  K 5.8*  CL 102  CO2 26  BUN 29*  CREATININE 1.88*  GLUCOSE 148*   Electrolytes  Recent Labs Lab 08/01/13 0154  CALCIUM 9.1   Sepsis Markers  Recent Labs Lab 08/01/13 0201  LATICACIDVEN 0.98   ABG  Recent Labs Lab 08/01/13 0154  PHART 7.243*  PCO2ART 72.1*  PO2ART 74.0*   Liver Enzymes No results found for this basename: AST, ALT, ALKPHOS, BILITOT, ALBUMIN,  in the last 168 hours Cardiac Enzymes  Recent Labs Lab 08/01/13 0154  TROPONINI <0.30  PROBNP 265.8*   Glucose No results found for this basename: GLUCAP,  in the last 168 hours  Imaging Dg Chest Portable 1 View  08/01/2013   CLINICAL DATA:  Shortness of Breath  EXAM: PORTABLE CHEST - 1 VIEW  COMPARISON:  July 06, 2013  FINDINGS: Tracheostomy tip is 5.7 cm above the carina. No pneumothorax. There is underlying emphysematous change. There is no edema or consolidation. Heart is enlarged with pulmonary vascularity within normal limits. No adenopathy.  IMPRESSION: Tracheostomy well seated. No pneumothorax. Underlying emphysema. Lungs clear. Stable cardiomegaly.   Electronically Signed   By: Bretta Bang M.D.   On: 08/01/2013 01:42    CXR: Tracheostomy well seated. No pneumothorax. Underlying emphysema. Lungs clear. Stable cardiomegaly.   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 Hypercarbia - improving Respiratory acidosis - improving P:   Full vent support, anticipate short term as he has clinically improved since being placed on vent Goal SpO2>92  Goal back to ATC in AM Nocturnal ventilator support per home regimen  CARDIOVASCULAR A: HFpEF - grade 1 diastolic dysfunction, EF 50-55% in April 2015 Chronic HTN No acute exacerbation P:  Continue home losartan, norvasc, coreg, lasix  RENAL A:  CKD, baseline creatinine 1.3-2, 1.88 on admission Hyperkalemia, 5.8 P:   Monitor BMET Kayexalate x1 Replete electrolytes as indicated  GASTROINTESTINAL A: Gastroparesis P:   Continue home reglan NPO overnight but will discuss starting diet in am  HEMATOLOGIC A:  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 Whether he would be a candidate for alternative anticoagulant that would not require monitoring Monitor daily INR  INFECTIOUS A:  No signs of acute infection P:   Monitor temp and CBC  ENDOCRINE A:  Diabetes, A1c 6.6 in April P:   Hold home lantus 20units daily for now CBGs, SSI  NEUROLOGIC A:  Lethargic - likely secondary to hypercarbia, much improved since being placed on vent P:   Monitor  TODAY'S SUMMARY: Trach dependent gentleman who presented with hypercarbia after trach clog and change at home by EMS. Somewhat lethargic so placed on vent which he uses at night at baseline.  Mental status and ABG improving.  Anticipate can return to ATC in a few hours as per his home regimen and can either discharge or transfer to [facility] in AM if doing better.   Beverely Low, MD, MPH [facility] PGY-2 08/01/2013 3:37 AM   I have personally obtained a history, examined the patient, evaluated laboratory and imaging results, formulated the assessment and plan and  placed orders. CRITICAL CARE: The patient is critically ill with multiple organ systems failure and requires high complexity decision making for assessment and support, frequent evaluation  and titration of therapies, application of advanced monitoring technologies and extensive interpretation of multiple databases. Critical Care Time devoted to patient care services described in this note is 50 minutes.    Baltazar Apo, MD, PhD 08/01/2013, 4:11 AM [facility] [phone] or if no answer [phone]

## 2013-08-01 NOTE — ED Notes (Signed)
Paged PCCM for pain medication for the patient. Informed that they would make rounds on the patient.

## 2013-08-01 NOTE — ED Provider Notes (Signed)
CSN: [id number]     Arrival date & time 07/31/13  2354 History   First MD Initiated Contact with Patient 08/01/13 0130     Chief Complaint  Patient presents with  . Shortness of Breath     (Consider location/radiation/quality/duration/timing/severity/associated sxs/prior Treatment) Patient is a 68 y.o. male presenting with shortness of breath. The history is provided by the EMS personnel. The history is limited by the condition of the patient (Altered mental status).  Shortness of Breath He was brought in by EMS after being called because of dyspnea. They found his tracheostomy was clogged and he was lethargic. They were unable to suction his tracheostomy replaced the tube and he seemed to improve regarding mentation. He was noted to be weak to the primary is unable to ambulate she was brought to the ED. In my evaluation, the patient barely opens his eyes to painful stimulation or voice and does not answer questions. He apparently is ventilator dependent at night.  Past Medical History  Diagnosis Date  . ANXIETY 07/23/2006  . ASTHMA 07/23/2006  . DM 05/23/2007  . HYPERTENSION 07/23/2006  . OSTEOARTHRITIS 07/23/2006  . HYPERCHOLESTEROLEMIA 05/23/2007  . ANEMIA 08/31/2008  . Palpitations 02/10/2008  . ALCOHOL ABUSE, IN REMISSION, HX OF 08/26/2008  . TOBACCO ABUSE, HX OF 08/26/2008  . Pulmonary embolism 03/16/2010  . ED (erectile dysfunction)   . Morbid obesity   . DM retinopathy   . 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 DM neuropathy with neurologic complication   . CHF (congestive heart failure)     With a preserved EF  . 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 Sleep apnea   . Shortness of breath   . CARCINOMA, PROSTATE 09/29/2009   Past Surgical History  Procedure Laterality Date  . Back surgery    . Tracheostomy     Family History  Problem Relation Age of Onset  . Cancer Neg Hx    History  Substance  Use Topics  . Smoking status: Former Smoker -- 0.50 packs/day for 15 years    Types: Cigarettes    Quit date: 01/16/2005  . Smokeless tobacco: Not on file  . Alcohol Use: No    Review of Systems  Unable to perform ROS: Mental status change  Respiratory: Positive for shortness of breath.       Allergies  Review of patient's allergies indicates no known allergies.  Home Medications   Prior to Admission medications   Medication Sig Start Date End Date Taking? Authorizing Provider  Alcohol Swabs PADS Apply 1 each topically daily.    Historical Provider, MD  alprazolam Duanne Moron) 2 MG tablet Take 2 tablets (4 mg total) by mouth at bedtime. 07/07/13   Janece Canterbury, MD  amLODipine (NORVASC) 10 MG tablet Take 1 tablet (10 mg total) by mouth daily. 07/04/13   Renato Shin, MD  Blood Glucose Monitoring Suppl (ONE TOUCH ULTRA 2) W/DEVICE KIT Use to check blood sugars 2/day. 04/10/13   Renato Shin, MD  carvedilol (COREG) 6.25 MG tablet Take 6.25 mg by mouth 2 (two) times daily with a meal.    Historical Provider, MD  citalopram (CELEXA) 20 MG tablet Take 1 tablet (20 mg total) by mouth daily. 04/30/13   Cherene Altes, MD  cloNIDine (CATAPRES) 0.1 MG tablet Take 0.1 mg by mouth daily. 02/12/13   Renato Shin, MD  cloNIDine (CATAPRES) 0.1  MG tablet TAKE ONE TABLET BY MOUTH TWICE DAILY 07/21/13   Renato Shin, MD  EASY COMFORT INSULIN SYRINGE 30G X 1/2" 1 ML MISC  11/25/12   Historical Provider, MD  enoxaparin (LOVENOX) 150 MG/ML injection Inject 1 mL (150 mg total) into the skin every 12 (twelve) hours. 07/11/13   Renato Shin, MD  furosemide (LASIX) 20 MG tablet Take 3 tablets (60 mg total) by mouth daily. 07/07/13   Janece Canterbury, MD  insulin glargine (LANTUS) 100 UNIT/ML injection Inject 20 Units into the skin every morning. 01/13/13   Renato Shin, MD  KLOR-CON M20 20 MEQ tablet TAKE TWO TABLETS BY MOUTH ONCE DAILY 07/10/13   Renato Shin, MD  KLOR-CON M20 20 MEQ tablet TAKE TWO TABLETS BY  MOUTH ONCE DAILY 07/21/13   Renato Shin, MD  Lancets [facility] ULTRASOFT) lancets Use to check blood sugars 2/day. 04/10/13   Renato Shin, MD  LANTUS 100 UNIT/ML injection INJECT 25 UNITS SUBCUTANEOUSLY EVERY MORNING 07/21/13   Renato Shin, MD  losartan (COZAAR) 100 MG tablet Take 100 mg by mouth daily. 05/27/13   Historical Provider, MD  metoCLOPramide (REGLAN) 5 MG tablet Take 5 mg by mouth 3 (three) times daily.    Historical Provider, MD  Multiple Vitamin (MULTIVITAMIN) tablet Take 1 tablet by mouth daily.     Historical Provider, MD  oxyCODONE-acetaminophen (PERCOCET) 10-325 MG per tablet Take 1 tablet by mouth every 6 (six) hours as needed for pain. 07/17/13   Renato Shin, MD  potassium chloride SA (K-DUR,KLOR-CON) 20 MEQ tablet Take 40 mEq by mouth daily.    Historical Provider, MD  simvastatin (ZOCOR) 20 MG tablet Take 20 mg by mouth daily.    Historical Provider, MD  warfarin (COUMADIN) 10 MG tablet Take as directed by anticoagulation clinic 07/16/13   Renato Shin, MD   BP 139/97  Pulse 73  Resp 19  SpO2 100% Physical Exam  Nursing note and vitals reviewed.  68 year old male, resting comfortably and in no acute distress. Vital signs are significant for hypertension with blood pressure 139/97. Oxygen saturation is 100%, which is normal. Head is normocephalic and atraumatic. PERRLA, EOMI. Oropharynx is clear. Neck is nontender and supple without adenopathy or JVD. Tracheostomy is in place. Back is nontender and there is no CVA tenderness. Lungs have distant sounds with diminished air flow, but no overt rales, wheezes, or rhonchi. Chest is nontender. Heart has regular rate and rhythm without murmur. Abdomen is soft, flat, nontender without masses or hepatosplenomegaly and peristalsis is normoactive. Extremities have 3+ edema with chronic venous stasis changes present and healing stasis ulcers present. Skin is warm and dry without other rash. Neurologic: Mental status is as noted above,  cranial nerves are grossly intact, there are no gross motor or sensory deficits.  ED Course  Procedures (including critical care time) Labs Review Results for orders placed during the hospital encounter of 07/31/13  I-STAT CG4 LACTIC ACID, ED      Result Value Ref Range   Lactic Acid, Venous 0.98  0.5 - 2.2 mmol/L  I-STAT ARTERIAL BLOOD GAS, ED      Result Value Ref Range   pH, Arterial 7.243 (*) 7.350 - 7.450   pCO2 arterial 72.1 (*) 35.0 - 45.0 mmHg   pO2, Arterial 74.0 (*) 80.0 - 100.0 mmHg   Bicarbonate 31.1 (*) 20.0 - 24.0 mEq/L   TCO2 33  0 - 100 mmol/L   O2 Saturation 91.0     Acid-Base Excess 2.0  0.0 -  2.0 mmol/L   Patient temperature 98.6 F     Collection site RADIAL, ALLEN'S TEST ACCEPTABLE     Drawn by Operator     Sample type ARTERIAL     Comment NOTIFIED PHYSICIAN      Imaging Review Dg Chest Portable 1 View  08/01/2013   CLINICAL DATA:  Shortness of Breath  EXAM: PORTABLE CHEST - 1 VIEW  COMPARISON:  July 06, 2013  FINDINGS: Tracheostomy tip is 5.7 cm above the carina. No pneumothorax. There is underlying emphysematous change. There is no edema or consolidation. Heart is enlarged with pulmonary vascularity within normal limits. No adenopathy.  IMPRESSION: Tracheostomy well seated. No pneumothorax. Underlying emphysema. Lungs clear. Stable cardiomegaly.   Electronically Signed   By: Lowella Grip M.D.   On: 08/01/2013 01:42     EKG Interpretation   Date/Time:  Friday August 01 2013 01:54:42 EDT Ventricular Rate:  83 PR Interval:  234 QRS Duration: 109 QT Interval:  366 QTC Calculation: 430 R Axis:   -80 Text Interpretation:  Sinus rhythm Prolonged PR interval Inferior infarct,  old Anterior infarct, old When compared with ECG of 07/06/2013, No  significant change was found Confirmed by [facility]  MD, Nayel Purdy ([id number]) on  08/01/2013 2:06:37 AM      CRITICAL CARE Performed by: [email or web address],[email or web address] Total critical care time: 50 minutes. Critical care time was exclusive of  separately billable procedures and treating other patients. Critical care was necessary to treat or prevent imminent or life-threatening deterioration. Critical care was time spent personally by me on the following activities: development of treatment plan with patient and/or surrogate as well as nursing, discussions with consultants, evaluation of patient's response to treatment, examination of patient, obtaining history from patient or surrogate, ordering and performing treatments and interventions, ordering and review of laboratory studies, ordering and review of radiographic studies, pulse oximetry and re-evaluation of patient's condition.  MDM   Final diagnoses:  Acute on chronic respiratory failure with hypercapnia    Lethargy of uncertain cause. I am concerned about possible respiratory failure with hypercarbia. He is maintaining an excellent oxygen saturation but this may be too high of an oxygen level for him. Old records are reviewed and he had been admitted to the hospital with a similar presentation about 3 weeks ago. In addition to screening labs, urinalysis will be obtained to look for occult UTI. Arterial blood gas will be obtained to evaluate for possible hypercarbia.  Has come back showing significant CO2 retention with a PCO2 of 72 and pH of 7.24. He is placed on mechanical ventilation and critical care is consulted to admit the patient. Case has been discussed with Dr. Alva Garnet of critical care service who agrees to admit the patient.  Delora Fuel, MD 33/29/51 [id number]

## 2013-08-01 NOTE — Progress Notes (Signed)
  CARE MANAGEMENT ED NOTE 08/01/2013  Patient:  Travis Chapman, Travis Chapman   Account Number:  [id number]  Date Initiated:  08/01/2013  Documentation initiated by:  Edwyna Shell  Subjective/Objective Assessment:   68 yo male presenting to the ED via EMS     Subjective/Objective Assessment Detail:     Action/Plan:   Patient will continued to be followed with [facility] RT support in the home and has follow up PCP appointment scheduled   Action/Plan Detail:   Anticipated DC Date:       Status Recommendation to Physician:   Result of Recommendation:  Agreed    DC Planning Services  CM consult  Other    Choice offered to / List presented to:            Status of service:  Completed, signed off  ED Comments:   ED Comments Detail:  CM spoke with the patient and he stated that he was receiving services from [facility] but they stopped coming out. This CM called Pearletha Forge, [facility] liaison, and spoke with her regarding patient [place] status with [facility]. Mary informed this CM that the patient has received HH RN, PT, OT, SW services with [facility] on and off in the past. She stated that the patient was just discharged from [facility] RN services last week. She also informed this CM that the patient will receive ongoing support from [facility] RT team for trach/vent care, support, and supplies. Mary informed this CM that the [facility] team attempted to find placement for the patient and he refused. This CM then spoke with the patient and he stated that he prefers to remain at home and he is aware that RT with [facility] provides services and equipment. He stated that they come every 4 weeks and he also understood that if he has any concerns or needs anything that he needs to call them. Patient verbalized understanding and declined contact information for [facility] because he states that he has the information at home already. Patient had no other questions or concerns. ED PA and staff nurse, Ria Comment updated.

## 2013-08-03 ENCOUNTER — Encounter (HOSPITAL_COMMUNITY): Payer: Self-pay | Admitting: Emergency Medicine

## 2013-08-03 ENCOUNTER — Inpatient Hospital Stay (HOSPITAL_COMMUNITY)
Admission: EM | Admit: 2013-08-03 | Discharge: 2013-08-12 | DRG: 291 | Disposition: A | Discharge status: Skilled Nursing Facility | Payer: Medicare Other | Attending: Internal Medicine | Admitting: Internal Medicine

## 2013-08-03 ENCOUNTER — Emergency Department (HOSPITAL_COMMUNITY): Payer: Medicare Other

## 2013-08-03 DIAGNOSIS — D696 Thrombocytopenia, unspecified: Secondary | ICD-10-CM | POA: Diagnosis present

## 2013-08-03 DIAGNOSIS — Z6841 Body Mass Index (BMI) 40.0 and over, adult: Secondary | ICD-10-CM

## 2013-08-03 DIAGNOSIS — R4182 Altered mental status, unspecified: Secondary | ICD-10-CM | POA: Diagnosis present

## 2013-08-03 DIAGNOSIS — E1149 Type 2 diabetes mellitus with other diabetic neurological complication: Secondary | ICD-10-CM | POA: Diagnosis present

## 2013-08-03 DIAGNOSIS — J9620 Acute and chronic respiratory failure, unspecified whether with hypoxia or hypercapnia: Secondary | ICD-10-CM | POA: Diagnosis present

## 2013-08-03 DIAGNOSIS — J45909 Unspecified asthma, uncomplicated: Secondary | ICD-10-CM | POA: Diagnosis present

## 2013-08-03 DIAGNOSIS — R29898 Other symptoms and signs involving the musculoskeletal system: Secondary | ICD-10-CM

## 2013-08-03 DIAGNOSIS — I509 Heart failure, unspecified: Secondary | ICD-10-CM | POA: Diagnosis present

## 2013-08-03 DIAGNOSIS — I2782 Chronic pulmonary embolism: Secondary | ICD-10-CM | POA: Diagnosis present

## 2013-08-03 DIAGNOSIS — Z930 Tracheostomy status: Secondary | ICD-10-CM | POA: Diagnosis not present

## 2013-08-03 DIAGNOSIS — I129 Hypertensive chronic kidney disease with stage 1 through stage 4 chronic kidney disease, or unspecified chronic kidney disease: Secondary | ICD-10-CM | POA: Diagnosis present

## 2013-08-03 DIAGNOSIS — Z87891 Personal history of nicotine dependence: Secondary | ICD-10-CM

## 2013-08-03 DIAGNOSIS — T45515A Adverse effect of anticoagulants, initial encounter: Secondary | ICD-10-CM | POA: Diagnosis present

## 2013-08-03 DIAGNOSIS — Z9911 Dependence on respirator [ventilator] status: Secondary | ICD-10-CM | POA: Diagnosis not present

## 2013-08-03 DIAGNOSIS — K3184 Gastroparesis: Secondary | ICD-10-CM | POA: Diagnosis present

## 2013-08-03 DIAGNOSIS — J9610 Chronic respiratory failure, unspecified whether with hypoxia or hypercapnia: Secondary | ICD-10-CM

## 2013-08-03 DIAGNOSIS — I10 Essential (primary) hypertension: Secondary | ICD-10-CM | POA: Diagnosis present

## 2013-08-03 DIAGNOSIS — J9500 Unspecified tracheostomy complication: Secondary | ICD-10-CM

## 2013-08-03 DIAGNOSIS — E7800 Pure hypercholesterolemia, unspecified: Secondary | ICD-10-CM | POA: Diagnosis present

## 2013-08-03 DIAGNOSIS — R791 Abnormal coagulation profile: Secondary | ICD-10-CM | POA: Diagnosis present

## 2013-08-03 DIAGNOSIS — E860 Dehydration: Secondary | ICD-10-CM | POA: Diagnosis present

## 2013-08-03 DIAGNOSIS — E662 Morbid (severe) obesity with alveolar hypoventilation: Secondary | ICD-10-CM | POA: Diagnosis present

## 2013-08-03 DIAGNOSIS — Z86711 Personal history of pulmonary embolism: Secondary | ICD-10-CM | POA: Diagnosis not present

## 2013-08-03 DIAGNOSIS — G8929 Other chronic pain: Secondary | ICD-10-CM

## 2013-08-03 DIAGNOSIS — K5900 Constipation, unspecified: Secondary | ICD-10-CM | POA: Diagnosis not present

## 2013-08-03 DIAGNOSIS — E785 Hyperlipidemia, unspecified: Secondary | ICD-10-CM | POA: Diagnosis present

## 2013-08-03 DIAGNOSIS — N183 Chronic kidney disease, stage 3 unspecified: Secondary | ICD-10-CM | POA: Diagnosis present

## 2013-08-03 DIAGNOSIS — I4891 Unspecified atrial fibrillation: Secondary | ICD-10-CM | POA: Diagnosis present

## 2013-08-03 DIAGNOSIS — E1143 Type 2 diabetes mellitus with diabetic autonomic (poly)neuropathy: Secondary | ICD-10-CM | POA: Diagnosis present

## 2013-08-03 DIAGNOSIS — I5033 Acute on chronic diastolic (congestive) heart failure: Principal | ICD-10-CM | POA: Diagnosis present

## 2013-08-03 DIAGNOSIS — J9612 Chronic respiratory failure with hypercapnia: Secondary | ICD-10-CM

## 2013-08-03 DIAGNOSIS — M1710 Unilateral primary osteoarthritis, unspecified knee: Secondary | ICD-10-CM | POA: Diagnosis present

## 2013-08-03 DIAGNOSIS — Z7901 Long term (current) use of anticoagulants: Secondary | ICD-10-CM | POA: Diagnosis not present

## 2013-08-03 DIAGNOSIS — G4730 Sleep apnea, unspecified: Secondary | ICD-10-CM | POA: Diagnosis present

## 2013-08-03 DIAGNOSIS — F1011 Alcohol abuse, in remission: Secondary | ICD-10-CM | POA: Diagnosis present

## 2013-08-03 DIAGNOSIS — J9622 Acute and chronic respiratory failure with hypercapnia: Secondary | ICD-10-CM

## 2013-08-03 DIAGNOSIS — Z794 Long term (current) use of insulin: Secondary | ICD-10-CM | POA: Diagnosis not present

## 2013-08-03 DIAGNOSIS — J9690 Respiratory failure, unspecified, unspecified whether with hypoxia or hypercapnia: Secondary | ICD-10-CM | POA: Diagnosis present

## 2013-08-03 DIAGNOSIS — I2699 Other pulmonary embolism without acute cor pulmonale: Secondary | ICD-10-CM

## 2013-08-03 DIAGNOSIS — M25561 Pain in right knee: Secondary | ICD-10-CM

## 2013-08-03 DIAGNOSIS — M25562 Pain in left knee: Secondary | ICD-10-CM

## 2013-08-03 DIAGNOSIS — D689 Coagulation defect, unspecified: Secondary | ICD-10-CM | POA: Diagnosis present

## 2013-08-03 DIAGNOSIS — I5031 Acute diastolic (congestive) heart failure: Secondary | ICD-10-CM | POA: Diagnosis present

## 2013-08-03 DIAGNOSIS — Z9181 History of falling: Secondary | ICD-10-CM | POA: Diagnosis not present

## 2013-08-03 DIAGNOSIS — Z9989 Dependence on other enabling machines and devices: Secondary | ICD-10-CM

## 2013-08-03 DIAGNOSIS — E1029 Type 1 diabetes mellitus with other diabetic kidney complication: Secondary | ICD-10-CM | POA: Diagnosis present

## 2013-08-03 DIAGNOSIS — G4733 Obstructive sleep apnea (adult) (pediatric): Secondary | ICD-10-CM

## 2013-08-03 LAB — BASIC METABOLIC PANEL
ANION GAP: 16 — AB (ref 5–15)
BUN: 25 mg/dL — ABNORMAL HIGH (ref 6–23)
CHLORIDE: 100 meq/L (ref 96–112)
CO2: 24 mEq/L (ref 19–32)
Calcium: 9 mg/dL (ref 8.4–10.5)
Creatinine, Ser: 1.53 mg/dL — ABNORMAL HIGH (ref 0.50–1.35)
GFR calc non Af Amer: 45 mL/min — ABNORMAL LOW (ref >90)
GFR, EST AFRICAN AMERICAN: 53 mL/min — AB (ref >90)
Glucose, Bld: 146 mg/dL — ABNORMAL HIGH (ref 70–99)
Potassium: 4.3 mEq/L (ref 3.7–5.3)
SODIUM: 140 meq/L (ref 137–147)

## 2013-08-03 LAB — I-STAT ARTERIAL BLOOD GAS, ED
ACID-BASE EXCESS: 5 mmol/L — AB (ref 0.0–2.0)
Acid-Base Excess: 3 mmol/L — ABNORMAL HIGH (ref 0.0–2.0)
BICARBONATE: 30 meq/L — AB (ref 20.0–24.0)
BICARBONATE: 32.3 meq/L — AB (ref 20.0–24.0)
O2 SAT: 98 %
O2 Saturation: 87 %
PCO2 ART: 55.2 mmHg — AB (ref 35.0–45.0)
PH ART: 7.345 — AB (ref 7.350–7.450)
Patient temperature: 99.6
TCO2: 32 mmol/L (ref 0–100)
TCO2: 34 mmol/L (ref 0–100)
pCO2 arterial: 61.7 mmHg (ref 35.0–45.0)
pH, Arterial: 7.327 — ABNORMAL LOW (ref 7.350–7.450)
pO2, Arterial: 59 mmHg — ABNORMAL LOW (ref 80.0–100.0)
pO2, Arterial: 108 mmHg — ABNORMAL HIGH (ref 80.0–100.0)

## 2013-08-03 LAB — CBC
HCT: 38.5 % — ABNORMAL LOW (ref 39.0–52.0)
HEMOGLOBIN: 12.2 g/dL — AB (ref 13.0–17.0)
MCH: 28.2 pg (ref 26.0–34.0)
MCHC: 31.7 g/dL (ref 30.0–36.0)
MCV: 88.9 fL (ref 78.0–100.0)
Platelets: 130 10*3/uL — ABNORMAL LOW (ref 150–400)
RBC: 4.33 MIL/uL (ref 4.22–5.81)
RDW: 13.1 % (ref 11.5–15.5)
WBC: 5.6 10*3/uL (ref 4.0–10.5)

## 2013-08-03 LAB — URINALYSIS, ROUTINE W REFLEX MICROSCOPIC
Bilirubin Urine: NEGATIVE
Glucose, UA: NEGATIVE mg/dL
Ketones, ur: NEGATIVE mg/dL
LEUKOCYTES UA: NEGATIVE
NITRITE: NEGATIVE
PH: 5.5 (ref 5.0–8.0)
PROTEIN: 100 mg/dL — AB
Specific Gravity, Urine: 1.019 (ref 1.005–1.030)
Urobilinogen, UA: 0.2 mg/dL (ref 0.0–1.0)

## 2013-08-03 LAB — CBG MONITORING, ED: GLUCOSE-CAPILLARY: 123 mg/dL — AB (ref 70–99)

## 2013-08-03 LAB — GLUCOSE, CAPILLARY
GLUCOSE-CAPILLARY: 160 mg/dL — AB (ref 70–99)
Glucose-Capillary: 138 mg/dL — ABNORMAL HIGH (ref 70–99)

## 2013-08-03 LAB — URINE MICROSCOPIC-ADD ON

## 2013-08-03 LAB — MRSA PCR SCREENING: MRSA BY PCR: NEGATIVE

## 2013-08-03 LAB — PRO B NATRIURETIC PEPTIDE: PRO B NATRI PEPTIDE: 844.9 pg/mL — AB (ref 0–125)

## 2013-08-03 LAB — PROTIME-INR
INR: 4.43 — ABNORMAL HIGH (ref 0.00–1.49)
Prothrombin Time: 42.2 seconds — ABNORMAL HIGH (ref 11.6–15.2)

## 2013-08-03 MED ORDER — WARFARIN - PHARMACIST DOSING INPATIENT
Freq: Every day | Status: DC
  Administered 2013-08-07: 1
  Administered 2013-08-09 – 2013-08-10 (×2)

## 2013-08-03 MED ORDER — CLONIDINE HCL 0.1 MG PO TABS
0.1000 mg | ORAL_TABLET | Freq: Two times a day (BID) | ORAL | Status: DC
  Administered 2013-08-03 – 2013-08-12 (×18): 0.1 mg via ORAL
  Filled 2013-08-03 (×19): qty 1

## 2013-08-03 MED ORDER — AMLODIPINE BESYLATE 10 MG PO TABS
10.0000 mg | ORAL_TABLET | Freq: Every day | ORAL | Status: DC
  Administered 2013-08-03 – 2013-08-12 (×10): 10 mg via ORAL
  Filled 2013-08-03 (×10): qty 1

## 2013-08-03 MED ORDER — SODIUM CHLORIDE 0.9 % IJ SOLN: 3.0000 mL | INTRAMUSCULAR | Status: DC | PRN

## 2013-08-03 MED ORDER — LOSARTAN POTASSIUM 50 MG PO TABS
100.0000 mg | ORAL_TABLET | Freq: Every day | ORAL | Status: DC
  Administered 2013-08-03 – 2013-08-10 (×8): 100 mg via ORAL
  Filled 2013-08-03 (×8): qty 2

## 2013-08-03 MED ORDER — POTASSIUM CHLORIDE CRYS ER 20 MEQ PO TBCR
40.0000 meq | EXTENDED_RELEASE_TABLET | Freq: Every day | ORAL | Status: DC
  Administered 2013-08-03 – 2013-08-04 (×2): 40 meq via ORAL
  Filled 2013-08-03 (×2): qty 2

## 2013-08-03 MED ORDER — CARVEDILOL 6.25 MG PO TABS
6.2500 mg | ORAL_TABLET | Freq: Two times a day (BID) | ORAL | Status: DC
  Administered 2013-08-03 – 2013-08-12 (×18): 6.25 mg via ORAL
  Filled 2013-08-03 (×21): qty 1

## 2013-08-03 MED ORDER — SODIUM CHLORIDE 0.9 % IV SOLN: 250.0000 mL | INTRAVENOUS | Status: DC | PRN

## 2013-08-03 MED ORDER — OXYCODONE-ACETAMINOPHEN 10-325 MG PO TABS: 1.0000 | ORAL_TABLET | Freq: Four times a day (QID) | ORAL | Status: DC | PRN

## 2013-08-03 MED ORDER — CITALOPRAM HYDROBROMIDE 20 MG PO TABS
20.0000 mg | ORAL_TABLET | Freq: Every day | ORAL | Status: DC
  Administered 2013-08-03 – 2013-08-12 (×10): 20 mg via ORAL
  Filled 2013-08-03 (×10): qty 1

## 2013-08-03 MED ORDER — SODIUM CHLORIDE 0.9 % IJ SOLN
3.0000 mL | Freq: Two times a day (BID) | INTRAMUSCULAR | Status: DC
  Administered 2013-08-03: 3 mL via INTRAVENOUS
  Administered 2013-08-03: 19:00:00 via INTRAVENOUS
  Administered 2013-08-04 – 2013-08-06 (×5): 3 mL via INTRAVENOUS

## 2013-08-03 MED ORDER — METOCLOPRAMIDE HCL 5 MG PO TABS
5.0000 mg | ORAL_TABLET | Freq: Three times a day (TID) | ORAL | Status: DC
  Administered 2013-08-03 – 2013-08-12 (×27): 5 mg via ORAL
  Filled 2013-08-03 (×31): qty 1

## 2013-08-03 MED ORDER — ACETAMINOPHEN 325 MG PO TABS: 650.0000 mg | ORAL_TABLET | ORAL | Status: DC | PRN

## 2013-08-03 MED ORDER — ONDANSETRON HCL 4 MG/2ML IJ SOLN: 4.0000 mg | Freq: Four times a day (QID) | INTRAMUSCULAR | Status: DC | PRN

## 2013-08-03 MED ORDER — ALPRAZOLAM 0.5 MG PO TABS
2.0000 mg | ORAL_TABLET | Freq: Every day | ORAL | Status: DC
  Administered 2013-08-03 – 2013-08-11 (×9): 2 mg via ORAL
  Filled 2013-08-03 (×9): qty 4

## 2013-08-03 MED ORDER — SIMVASTATIN 20 MG PO TABS
20.0000 mg | ORAL_TABLET | Freq: Every day | ORAL | Status: DC
  Administered 2013-08-03: 20 mg via ORAL
  Filled 2013-08-03 (×2): qty 1

## 2013-08-03 MED ORDER — FUROSEMIDE 10 MG/ML IJ SOLN
20.0000 mg | Freq: Two times a day (BID) | INTRAMUSCULAR | Status: DC
  Administered 2013-08-03 – 2013-08-06 (×6): 20 mg via INTRAVENOUS
  Filled 2013-08-03 (×7): qty 2

## 2013-08-03 MED ORDER — INSULIN GLARGINE 100 UNIT/ML ~~LOC~~ SOLN
20.0000 [IU] | Freq: Every morning | SUBCUTANEOUS | Status: DC
  Administered 2013-08-04 – 2013-08-11 (×8): 20 [IU] via SUBCUTANEOUS
  Filled 2013-08-03 (×9): qty 0.2

## 2013-08-03 MED ORDER — OXYCODONE-ACETAMINOPHEN 5-325 MG PO TABS
1.0000 | ORAL_TABLET | Freq: Four times a day (QID) | ORAL | Status: DC | PRN
  Administered 2013-08-03 – 2013-08-04 (×2): 1 via ORAL
  Filled 2013-08-03 (×2): qty 1

## 2013-08-03 MED ORDER — OXYCODONE HCL 5 MG PO TABS
5.0000 mg | ORAL_TABLET | Freq: Four times a day (QID) | ORAL | Status: DC | PRN
  Administered 2013-08-03: 5 mg via ORAL
  Filled 2013-08-03: qty 1

## 2013-08-03 NOTE — ED Notes (Signed)
Received breakfast tray

## 2013-08-03 NOTE — Consult Note (Addendum)
PULMONARY / CRITICAL CARE MEDICINE Name: Travis Chapman MRN: [id number] DOB: [birth year]    ADMISSION DATE:  08/03/2013 CONSULTATION DATE:  08/03/2013  REFERRING MD :  EDP  PRIMARY SERVICE:  TRH   CHIEF COMPLAINT:  Altered mental status   BRIEF PATIENT DESCRIPTION: 68yo male with chronic trach requiring nocturnal vent presented to ER 7/19 for altered mental status. Recent discharge after being treated for trach plugging. PCCM asked to consult for vent management.   SIGNIFICANT EVENTS / STUDIES:   LINES / TUBES: Trach ( chronic )  CULTURES:  ANTIBIOTICS:  HISTORY OF PRESENT ILLNESS:    68 y.o. y/o male with a PMH chronic trach for OSA/OHS who presented to [facility] ER on 7/19 early am with confusion and weakness. Recent admission for hypecarbia w/ trach plugging . Improved and was discharged on 7/17 back to his baseline after inner cannula changes and suctioned.  He was treated for possible bronchitis w/ 5 days of Doxycyline.  Today on admission no sigh of worsening hypercarbia w/ PCO2 decreased at 55 (improved from recent discharge. He is on nocturnal vent.  CXR today w/ no sign of acute process noted.  Pt mentation improved in ER and he declined admission however family told EDP he can not come home as they are not able to care for him. Case Manager was contacted  PCCM asked to consult for vent management.    PAST MEDICAL HISTORY :  Past Medical History  Diagnosis Date  . ANXIETY 07/23/2006  . ASTHMA 07/23/2006  . DM 05/23/2007  . HYPERTENSION 07/23/2006  . OSTEOARTHRITIS 07/23/2006  . HYPERCHOLESTEROLEMIA 05/23/2007  . ANEMIA 08/31/2008  . Palpitations 02/10/2008  . ALCOHOL ABUSE, IN REMISSION, HX OF 08/26/2008  . TOBACCO ABUSE, HX OF 08/26/2008  . Pulmonary embolism 03/16/2010  . ED (erectile dysfunction)   . Morbid obesity   . DM retinopathy   . 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 DM neuropathy with neurologic complication   . CHF (congestive heart  failure)     With a preserved EF  . 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 Sleep apnea   . Shortness of breath   . CARCINOMA, PROSTATE 09/29/2009   Past Surgical History  Procedure Laterality Date  . Back surgery    . Tracheostomy     Prior to Admission medications   Medication Sig Start Date End Date Taking? Authorizing Provider  alprazolam Duanne Moron) 2 MG tablet Take 2 tablets (4 mg total) by mouth at bedtime. 07/07/13  Yes Janece Canterbury, MD  amLODipine (NORVASC) 10 MG tablet Take 1 tablet (10 mg total) by mouth daily. 07/04/13  Yes Renato Shin, MD  carvedilol (COREG) 6.25 MG tablet Take 6.25 mg by mouth 2 (two) times daily with a meal.   Yes Historical Provider, MD  citalopram (CELEXA) 20 MG tablet Take 1 tablet (20 mg total) by mouth daily. 04/30/13  Yes Cherene Altes, MD  cloNIDine (CATAPRES) 0.1 MG tablet Take 0.1 mg by mouth 2 (two) times daily.  02/12/13  Yes Renato Shin, MD  doxycycline (VIBRAMYCIN) 100 MG capsule Take 1 capsule (100 mg total) by mouth 2 (two) times daily. 08/01/13  Yes Donita Brooks, NP  furosemide (LASIX) 20 MG tablet Take 3 tablets (60 mg total) by mouth daily. 07/07/13  Yes Janece Canterbury, MD  insulin glargine (LANTUS) 100 UNIT/ML injection Inject 20 Units into  the skin every morning. 01/13/13  Yes Renato Shin, MD  losartan (COZAAR) 100 MG tablet Take 100 mg by mouth daily. 05/27/13  Yes Historical Provider, MD  metoCLOPramide (REGLAN) 5 MG tablet Take 5 mg by mouth 3 (three) times daily.   Yes Historical Provider, MD  Multiple Vitamin (MULTIVITAMIN) tablet Take 1 tablet by mouth daily.    Yes Historical Provider, MD  oxyCODONE-acetaminophen (PERCOCET) 10-325 MG per tablet Take 1 tablet by mouth every 6 (six) hours as needed for pain. 07/17/13  Yes Renato Shin, MD  potassium chloride SA (K-DUR,KLOR-CON) 20 MEQ tablet Take 40 mEq by mouth daily.   Yes Historical Provider, MD  simvastatin (ZOCOR) 20 MG  tablet Take 20 mg by mouth daily.   Yes Historical Provider, MD  warfarin (COUMADIN) 5 MG tablet Take 1.5 tablets (7.5 mg total) by mouth daily. 08/02/13  Yes Donita Brooks, NP   No Known Allergies  FAMILY HISTORY:  Family History  Problem Relation Age of Onset  . Cancer Neg Hx    SOCIAL HISTORY:  reports that he quit smoking about 8 years ago. His smoking use included Cigarettes. He has a 7.5 pack-year smoking history. He does not have any smokeless tobacco history on file. He reports that he does not drink alcohol. His drug history is not on file.  REVIEW OF SYSTEMS:   Constitutional: Negative for fever, chills, weight loss,  ++malaise/fatigue and diaphoresis.  HENT: Negative for hearing loss, ear pain, nosebleeds,  sore throat, neck pain, tinnitus and ear discharge.   Eyes: Negative for blurred vision, double vision, photophobia, pain,  +discharge and redness.  Respiratory: ++ cough,, sputum production, shortness of breath,   Cardiovascular: Negative for chest pain, palpitations, orthopnea, claudication,  +leg swelling  Gastrointestinal: Negative for heartburn, nausea, vomiting, abdominal pain, diarrhea, constipation, blood in stool and melena.  Genitourinary: Negative for dysuria, urgency, frequency, hematuria and flank pain.  Musculoskeletal: Negative for myalgias, Skin: Negative for itching and rash.  Neurological: Negative for dizziness, tingling, tremors, sensory change,   INTERVAL HISTORY:  VITAL SIGNS: Temp:  [99.6 F (37.6 C)] 99.6 F (37.6 C) (07/19 0326) Pulse Rate:  [73-82] 76 (07/19 1127) Resp:  [19-22] 19 (07/19 1127) BP: (127-168)/(65-91) 139/65 mmHg (07/19 0800) SpO2:  [96 %-100 %] 100 % (07/19 1127) FiO2 (%):  [40 %-60 %] 40 % (07/19 1127)  PHYSICAL EXAMINATION: General: Obese man with trach  Neuro: alert and oriented, no focal deficits noted  HEENT: dry mucosa, matted eyes Cardiovascular: RRR, no MRG, tr-1 +LE edema  Lungs: tr rhonchi, no wheezing,  normal WOB, trach dependent  Abdomen: Soft, morbidly obese, BS +, NT  Musculoskeletal: grossly normal  Skin: VI changes of LE w/ stasis dermatitis   LABS:  Recent Labs Lab 08/01/13 0154 08/01/13 0452 08/03/13 0444  NA 139 142 140  K 5.8* 5.1 4.3  CL 102 103 100  CO2 26 30 24   BUN 29* 29* 25*  CREATININE 1.88* 1.89* 1.53*  GLUCOSE 148* 156* 146*    Recent Labs Lab 08/01/13 0154 08/01/13 0452 08/03/13 0444  HGB 12.3* 11.7* 12.2*  HCT 39.4 37.1* 38.5*  WBC 6.2 6.3 5.6  PLT 150 168 130*    Recent Labs Lab 08/01/13 0154 08/01/13 0342 08/03/13 0420 08/03/13 0920  PHART 7.243* 7.338* 7.345* 7.327*  PCO2ART 72.1* 57.2* 55.2* 61.7*  PO2ART 74.0* 78.0* 59.0* 108.0*  HCO3 31.1* 30.7* 30.0* 32.3*  TCO2 33 32 32 34  O2SAT 91.0 94.0 87.0 98.0   IMAGING:  Dg Chest Port 1 View  08/03/2013   CLINICAL DATA:  Chest pain.  EXAM: PORTABLE CHEST - 1 VIEW  COMPARISON:  Chest radiograph performed 08/01/2013  FINDINGS: The lungs are well-aerated. Vascular congestion is noted. Mild bilateral atelectasis is noted. There is no evidence of pleural effusion or pneumothorax.  The cardiomediastinal silhouette is mildly enlarged. No acute osseous abnormalities are seen. A tracheostomy tube is seen ending 5 cm above the carina.  IMPRESSION: Vascular congestion and mild cardiomegaly. Mild bilateral atelectasis noted.   Electronically Signed   By: Garald Balding M.D.   On: 08/03/2013 05:06    ASSESSMENT / PLAN: OSA/OHS  Chronic respiratory failure Mild hypercarbia off vent Tracheostomy status Vent dependent    Goal SpO2>92  ATC during daytime  Nocturnal vent support per home regimen -will discuss with RT his vent setting from home .   May use vent support as needed if evidence of hypercarbia / hypercarbic encephalopathy  Case manager to assist with home needs vs SNF placement   Will sign off, please re consult PRN  [facility] NP  Pulmonary and [place] Pager: [phone]  08/03/2013, 12:27 PM  I have personally obtained history, examined patient, evaluated and interpreted laboratory and imaging results, reviewed medical records, formulated assessment / plan and placed orders.  Doree Fudge, MD Pulmonary and [place] Pager: [phone]  08/03/2013, 3:51 PM

## 2013-08-03 NOTE — ED Notes (Signed)
Family at bedside. Daughter reports pt should not be discharged home, family unable to care for pt at home. Informed dr docherty and we have called care manager.

## 2013-08-03 NOTE — Progress Notes (Signed)
ANTICOAGULATION CONSULT NOTE - Initial Consult  Pharmacy Consult:  Coumadin Indication: atrial fibrillation  No Known Allergies  Patient Measurements: Height: 6' 5.17" (196 cm) Weight: 368 lb 9.8 oz (167.2 kg) IBW/kg (Calculated) : 89.48  Vital Signs: BP: 150/85 mmHg (07/19 1500) Pulse Rate: 73 (07/19 1500)  Labs:  Recent Labs  08/01/13 0154 08/01/13 0452 08/03/13 0444 08/03/13 1242  HGB 12.3* 11.7* 12.2*  --   HCT 39.4 37.1* 38.5*  --   PLT 150 168 130*  --   LABPROT  --  42.9*  --  42.2*  INR  --  4.52*  --  4.43*  CREATININE 1.88* 1.89* 1.53*  --   TROPONINI <0.30  --   --   --     Estimated Creatinine Clearance: 79.9 ml/min (by C-G formula based on Cr of 1.53).   Medical History: Past Medical History  Diagnosis Date  . ANXIETY 07/23/2006  . ASTHMA 07/23/2006  . DM 05/23/2007  . HYPERTENSION 07/23/2006  . OSTEOARTHRITIS 07/23/2006  . HYPERCHOLESTEROLEMIA 05/23/2007  . ANEMIA 08/31/2008  . Palpitations 02/10/2008  . ALCOHOL ABUSE, IN REMISSION, HX OF 08/26/2008  . TOBACCO ABUSE, HX OF 08/26/2008  . Pulmonary embolism 03/16/2010  . ED (erectile dysfunction)   . Morbid obesity   . DM retinopathy   . 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 DM neuropathy with neurologic complication   . CHF (congestive heart failure)     With a preserved EF  . 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 Sleep apnea   . Shortness of breath   . CARCINOMA, PROSTATE 09/29/2009       Assessment: 21 YOM with history of Afib and PE to continue on chronic Coumadin.  INR supra-therapeutic on admit.  No bleeding reported.  Aware patient with thrombocytopenia.   Goal of Therapy:  INR 2-3    Plan:  - Hold Coumadin today - Daily PT / INR    Jackeline Gutknecht D. Mina Marble, PharmD, BCPS Pager:  [phone] 08/03/2013, 4:11 PM

## 2013-08-03 NOTE — ED Notes (Signed)
Critical care here to eval pt

## 2013-08-03 NOTE — ED Notes (Signed)
Per Dr. Sabra Heck pt needs to eat and then go home.

## 2013-08-03 NOTE — ED Notes (Signed)
XR at bedside

## 2013-08-03 NOTE — ED Notes (Addendum)
Pt more alert, reports reason of visit is due to bilateral leg pain and weakness to both extremities. Which has been ongoing for several months

## 2013-08-03 NOTE — ED Notes (Signed)
2 unsuccessful attempts at IV start, IV team paged

## 2013-08-03 NOTE — ED Notes (Signed)
Paged iv team to start peripheral line for admission

## 2013-08-03 NOTE — Progress Notes (Signed)
.   CARE MANAGEMENT ED NOTE 08/03/2013  Patient:  Travis Chapman, Travis Chapman   Account Number:  [id number]  Date Initiated:  08/03/2013  Documentation initiated by:  Laurena Slimmer  Subjective/Objective Assessment:   Patient presented to [facility] ED with AMS and weakness s/p fall.     Subjective/Objective Assessment Detail:     Action/Plan:   New Goal of care is SNF with vent placement   Action/Plan Detail:   Anticipated DC Date:       Status Recommendation to Physician:   Result of Recommendation:  Agreed  Other ED Services  Consult Working Plan      Choice offered to / List presented to:  C-1 Patient          Status of service:  In process, will continue to follow  ED Comments:   ED Comments Detail:  08/03/2013 9:33 Travis Maya RN BSN NCM [phone] ED CM consulted by Dr. Tawnya Crook concerning f/u on SNF placement. Pt presented to [facility] ED via EMS with AMS and weakness, patient was recently in [facility] ED 7/17 with s/p fall with generalized weakness, and was discharged home, patient has been refusing SNF placement Pt has had several ED visits and 5 hospitalizations. Pt ha history of chronic respiratory failure and is ventilator dependent at night, Pt was discharge from [facility] on 6/23 with [facility] and CSW to assist with FL2 for SNF Rehab placement. [facility] had attempted to work on placement but, patient has been refusing.  At present time Pt and family goal of care is [facility] /Rehab placement. Patient lives with 2 yo mother patient has admitted that she is not able to care for him. Spoke with Travis Chapman Daughter [phone], she states, they cannot provide the level of care her dad needs.  Unit CM and CSW will continue to f/u for discharge plan

## 2013-08-03 NOTE — ED Provider Notes (Signed)
CSN: [id number]     Arrival date & time 08/03/13  0315 History   First MD Initiated Contact with Patient 08/03/13 0345     Chief Complaint  Patient presents with  . Altered Mental Status     (Consider location/radiation/quality/duration/timing/severity/associated sxs/prior Treatment) HPI Comments: 68 year old male with a history of trach, diabetes, congestive heart failure, pulmonary embolism and hypertension. The patient does have a history of chronic respiratory failure and is ventilator dependent at night. According to the paramedics the patient presents with mental status change, this is gradual in onset, persistent, severe and gradually worsening. He  has been declining in his mental status, he was seen in the last 48 hours at which time he was noted to be hypercarbic, his trach  was suctioned successfully, he oxygenated and his mental status was at his baseline and was discharged home.  The patient is unable to wake up to give any information, there is no contacts are available by phone at this time. There was no reported seizure vomiting  Patient is a 68 y.o. male presenting with altered mental status. The history is provided by the EMS personnel and medical records.  Altered Mental Status   Past Medical History  Diagnosis Date  . ANXIETY 07/23/2006  . ASTHMA 07/23/2006  . DM 05/23/2007  . HYPERTENSION 07/23/2006  . OSTEOARTHRITIS 07/23/2006  . HYPERCHOLESTEROLEMIA 05/23/2007  . ANEMIA 08/31/2008  . Palpitations 02/10/2008  . ALCOHOL ABUSE, IN REMISSION, HX OF 08/26/2008  . TOBACCO ABUSE, HX OF 08/26/2008  . Pulmonary embolism 03/16/2010  . ED (erectile dysfunction)   . Morbid obesity   . DM retinopathy   . 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 DM neuropathy with neurologic complication   . CHF (congestive heart failure)     With a preserved EF  . 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 Sleep apnea   .  Shortness of breath   . CARCINOMA, PROSTATE 09/29/2009   Past Surgical History  Procedure Laterality Date  . Back surgery    . Tracheostomy     Family History  Problem Relation Age of Onset  . Cancer Neg Hx    History  Substance Use Topics  . Smoking status: Former Smoker -- 0.50 packs/day for 15 years    Types: Cigarettes    Quit date: 01/16/2005  . Smokeless tobacco: Not on file  . Alcohol Use: No    Review of Systems  Unable to perform ROS: Mental status change      Allergies  Review of patient's allergies indicates no known allergies.  Home Medications   Prior to Admission medications   Medication Sig Start Date End Date Taking? Authorizing Provider  alprazolam Duanne Moron) 2 MG tablet Take 2 tablets (4 mg total) by mouth at bedtime. 07/07/13  Yes Janece Canterbury, MD  amLODipine (NORVASC) 10 MG tablet Take 1 tablet (10 mg total) by mouth daily. 07/04/13  Yes Renato Shin, MD  carvedilol (COREG) 6.25 MG tablet Take 6.25 mg by mouth 2 (two) times daily with a meal.   Yes Historical Provider, MD  citalopram (CELEXA) 20 MG tablet Take 1 tablet (20 mg total) by mouth daily. 04/30/13  Yes Cherene Altes, MD  cloNIDine (CATAPRES) 0.1 MG tablet Take 0.1 mg by mouth 2 (two) times daily.  02/12/13  Yes Renato Shin, MD  doxycycline (VIBRAMYCIN) 100 MG capsule Take 1 capsule (  100 mg total) by mouth 2 (two) times daily. 08/01/13  Yes Donita Brooks, NP  furosemide (LASIX) 20 MG tablet Take 3 tablets (60 mg total) by mouth daily. 07/07/13  Yes Janece Canterbury, MD  insulin glargine (LANTUS) 100 UNIT/ML injection Inject 20 Units into the skin every morning. 01/13/13  Yes Renato Shin, MD  losartan (COZAAR) 100 MG tablet Take 100 mg by mouth daily. 05/27/13  Yes Historical Provider, MD  metoCLOPramide (REGLAN) 5 MG tablet Take 5 mg by mouth 3 (three) times daily.   Yes Historical Provider, MD  Multiple Vitamin (MULTIVITAMIN) tablet Take 1 tablet by mouth daily.    Yes Historical Provider, MD   oxyCODONE-acetaminophen (PERCOCET) 10-325 MG per tablet Take 1 tablet by mouth every 6 (six) hours as needed for pain. 07/17/13  Yes Renato Shin, MD  potassium chloride SA (K-DUR,KLOR-CON) 20 MEQ tablet Take 40 mEq by mouth daily.   Yes Historical Provider, MD  simvastatin (ZOCOR) 20 MG tablet Take 20 mg by mouth daily.   Yes Historical Provider, MD  warfarin (COUMADIN) 5 MG tablet Take 1.5 tablets (7.5 mg total) by mouth daily. 08/02/13  Yes Donita Brooks, NP   BP 168/91  Pulse 82  Temp(Src) 99.6 F (37.6 C) (Oral)  Resp 20  SpO2 100% Physical Exam  Nursing note and vitals reviewed. Constitutional: He appears well-developed and well-nourished. No distress.  HENT:  Head: Normocephalic and atraumatic.  Mouth/Throat: Oropharynx is clear and moist. No oropharyngeal exudate.  Eyes: Conjunctivae are normal. No scleral icterus.  Neck: Normal range of motion. Neck supple. No JVD present. No thyromegaly present.  Cardiovascular: Normal rate, regular rhythm, normal heart sounds and intact distal pulses.  Exam reveals no gallop and no friction rub.   No murmur heard. Pulmonary/Chest: Effort normal and breath sounds normal. No respiratory distress. He has no wheezes. He has no rales.  Decreased lung sounds bilaterally, no increased work of breathing  Abdominal: Soft. Bowel sounds are normal. He exhibits no distension and no mass. There is no tenderness.  Musculoskeletal: Normal range of motion. He exhibits edema. He exhibits no tenderness.  The patient has bilateral lower extremity edema, this is baseline from when I have seen him in the past  Lymphadenopathy:    He has no cervical adenopathy.  Neurological: He is alert. Coordination normal.  Somnolent but arousable, follows commands such as squeezing my hands bilaterally, raising his feet off the bed bilaterally. He does not speak, he immediately falls back asleep  Skin: Skin is warm and dry. No rash noted. No erythema.  Psychiatric: He has a  normal mood and affect. His behavior is normal.    ED Course  Procedures (including critical care time) Labs Review Labs Reviewed  BASIC METABOLIC PANEL - Abnormal; Notable for the following:    Glucose, Bld 146 (*)    BUN 25 (*)    Creatinine, Ser 1.53 (*)    GFR calc non Af Amer 45 (*)    GFR calc Af Amer 53 (*)    Anion gap 16 (*)    All other components within normal limits  CBC - Abnormal; Notable for the following:    Hemoglobin 12.2 (*)    HCT 38.5 (*)    Platelets 130 (*)    All other components within normal limits  URINALYSIS, ROUTINE W REFLEX MICROSCOPIC - Abnormal; Notable for the following:    Hgb urine dipstick SMALL (*)    Protein, ur 100 (*)    All  other components within normal limits  URINE MICROSCOPIC-ADD ON - Abnormal; Notable for the following:    Casts HYALINE CASTS (*)    All other components within normal limits  I-STAT ARTERIAL BLOOD GAS, ED - Abnormal; Notable for the following:    pH, Arterial 7.345 (*)    pCO2 arterial 55.2 (*)    pO2, Arterial 59.0 (*)    Bicarbonate 30.0 (*)    Acid-Base Excess 3.0 (*)    All other components within normal limits    Imaging Review Dg Chest Port 1 View  08/03/2013   CLINICAL DATA:  Chest pain.  EXAM: PORTABLE CHEST - 1 VIEW  COMPARISON:  Chest radiograph performed 08/01/2013  FINDINGS: The lungs are well-aerated. Vascular congestion is noted. Mild bilateral atelectasis is noted. There is no evidence of pleural effusion or pneumothorax.  The cardiomediastinal silhouette is mildly enlarged. No acute osseous abnormalities are seen. A tracheostomy tube is seen ending 5 cm above the carina.  IMPRESSION: Vascular congestion and mild cardiomegaly. Mild bilateral atelectasis noted.   Electronically Signed   By: Garald Balding M.D.   On: 08/03/2013 05:06    ED ECG REPORT  I personally interpreted this EKG   Date: 08/03/2013   Rate: 70  Rhythm: normal sinus rhythm  QRS Axis: left  Intervals: normal  ST/T Wave  abnormalities: nonspecific T wave changes  Conduction Disutrbances:none  Narrative Interpretation: PRWP  Old EKG Reviewed: unchanged   MDM   Final diagnoses:  None    The patient is more confused than his baseline more somnolent and will need further evaluation. ABG shows that the patient is not hypercarbic in fact it is improved since his last visit, he has not acidotic which has also improved, labs pending, chest x-ray pending.  I have discussed the findings with patient and he has refused to be admitted - he now states that he is not shortness of breath, and has no CP but that he came for his leg weakness - it is bilateral and causes difficulty walking - this is common for him, I have seen him for this in the past and I have informed him that since he doesn't have an identifiable medical or surgical reason for this that he would need placement in long term care / rehab which he states he doesn't want - he wants to go home and states that he has family to care for him and a scooter to help with ambulation.  He feels fine otherwise, has normal labs and ABG is improved without sig hypercapnea and no acidosis.  Will d/c ome.    Johnna Acosta, MD 08/03/13 [phone]

## 2013-08-03 NOTE — H&P (Signed)
History and Physical  [name] EGB:[id number] DOB: 26-Aug-1945 DOA: 08/03/2013  Referring physician: Wilkie Aye, ER physician PCP: Renato Shin, MD   Chief Complaint: Confusion  HPI: Travis Chapman is a 68 y.o. male  With past medical history of nocturnal ventilator dependent tracheostomy from obesity hypoventilation syndrome, chronic anticoagulation for pulmonary embolus and diastolic heart failure who was brought in by family for increased confusion and frequent falls. Patient had just been emergency room 2 days prior for similar episode and tracheostomy was found to have mucous plug which was able to be cleared. Patient went home on by mouth antibiotics. He has had several admissions in the past year and has refused skilled nursing in the past.   In emergency room, patient was noted to have mild acute respiratory failure with a PCO2 in the 60s as well as an INR of 4.3, concerning for likely acute diastolic heart failure. Hospitalists were called for further evaluation and admission. Family had indicated that this time, they would like for patient to be placed.  Review of Systems:  Patient seen after arrival to step down unit. Patient quite somnolent, difficult to awaken. I'm unable to get any kind of review of systems from him.   Past Medical History  Diagnosis Date  . ANXIETY 07/23/2006  . ASTHMA 07/23/2006  . DM 05/23/2007  . HYPERTENSION 07/23/2006  . OSTEOARTHRITIS 07/23/2006  . HYPERCHOLESTEROLEMIA 05/23/2007  . ANEMIA 08/31/2008  . Palpitations 02/10/2008  . ALCOHOL ABUSE, IN REMISSION, HX OF 08/26/2008  . TOBACCO ABUSE, HX OF 08/26/2008  . Pulmonary embolism 03/16/2010  . ED (erectile dysfunction)   . Morbid obesity   . DM retinopathy   . 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 DM neuropathy with neurologic complication   . CHF (congestive heart failure)     With a preserved EF  . 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 Sleep apnea   . Shortness of breath   . CARCINOMA, PROSTATE 09/29/2009   Past Surgical History  Procedure Laterality Date  . Back surgery    . Tracheostomy     Social History:  reports that he quit smoking about 8 years ago. His smoking use included Cigarettes. He has a 7.5 pack-year smoking history. He does not have any smokeless tobacco history on file. He reports that he does not drink alcohol. His drug history is not on file. Patient lives at home with his mother & is able to participate in activities of daily living only minimally. He has been having frequent falls  No Known Allergies  Family History  Problem Relation Age of Onset  . Cancer Neg Hx    this is obtained from previous medical records. Unable to confirm by patient  Prior to Admission medications   Medication Sig Start Date End Date Taking? Authorizing Provider  alprazolam Duanne Moron) 2 MG tablet Take 2 tablets (4 mg total) by mouth at bedtime. 07/07/13  Yes Janece Canterbury, MD  amLODipine (NORVASC) 10 MG tablet Take 1 tablet (10 mg total) by mouth daily. 07/04/13  Yes Renato Shin, MD  carvedilol (COREG) 6.25 MG tablet Take 6.25 mg by mouth 2 (two) times daily with a meal.   Yes Historical Provider, MD  citalopram (CELEXA) 20 MG tablet Take 1 tablet (20 mg total) by mouth daily. 04/30/13  Yes Cherene Altes, MD  cloNIDine (CATAPRES) 0.1 MG tablet Take 0.1 mg by  mouth 2 (two) times daily.  02/12/13  Yes Renato Shin, MD  doxycycline (VIBRAMYCIN) 100 MG capsule Take 1 capsule (100 mg total) by mouth 2 (two) times daily. 08/01/13  Yes Donita Brooks, NP  furosemide (LASIX) 20 MG tablet Take 3 tablets (60 mg total) by mouth daily. 07/07/13  Yes Janece Canterbury, MD  insulin glargine (LANTUS) 100 UNIT/ML injection Inject 20 Units into the skin every morning. 01/13/13  Yes Renato Shin, MD  losartan (COZAAR) 100 MG tablet Take 100 mg by mouth daily. 05/27/13  Yes Historical Provider, MD  metoCLOPramide (REGLAN)  5 MG tablet Take 5 mg by mouth 3 (three) times daily.   Yes Historical Provider, MD  Multiple Vitamin (MULTIVITAMIN) tablet Take 1 tablet by mouth daily.    Yes Historical Provider, MD  oxyCODONE-acetaminophen (PERCOCET) 10-325 MG per tablet Take 1 tablet by mouth every 6 (six) hours as needed for pain. 07/17/13  Yes Renato Shin, MD  potassium chloride SA (K-DUR,KLOR-CON) 20 MEQ tablet Take 40 mEq by mouth daily.   Yes Historical Provider, MD  simvastatin (ZOCOR) 20 MG tablet Take 20 mg by mouth daily.   Yes Historical Provider, MD  warfarin (COUMADIN) 5 MG tablet Take 1.5 tablets (7.5 mg total) by mouth daily. 08/02/13  Yes Donita Brooks, NP    Physical Exam: BP 150/85  Pulse 73  Temp(Src) 99.6 F (37.6 C) (Oral)  Resp 13  SpO2 100%  General:  Somnolent, difficult to awaken Eyes: Sclerae appear nonicteric ENT: Normocephalic, status post tracheostomy, mucous membranes are dry Neck: Tracheostomy noted Cardiovascular: Regular rate and rhythm, S1, S2, 2/6 systolic ejection murmur Respiratory: Decreased breath sounds throughout Abdomen: Soft, obese, hypoactive bowel sounds, PEG tube in place Skin: No overt skin breaks or tears, unable to ascertain backside Musculoskeletal: Trace pitting edema bilaterally Psychiatric: Unable to evaluate, given patient's somnolence Neurologic: No obvious overt focal deficits           Labs on Admission:  Basic Metabolic Panel:  Recent Labs Lab 08/01/13 0154 08/01/13 0452 08/03/13 0444  NA 139 142 140  K 5.8* 5.1 4.3  CL 102 103 100  CO2 26 30 24   GLUCOSE 148* 156* 146*  BUN 29* 29* 25*  CREATININE 1.88* 1.89* 1.53*  CALCIUM 9.1 8.9 9.0  MG  --  2.2  --   PHOS  --  2.7  --    Liver Function Tests: No results found for this basename: AST, ALT, ALKPHOS, BILITOT, PROT, ALBUMIN,  in the last 168 hours No results found for this basename: LIPASE, AMYLASE,  in the last 168 hours No results found for this basename: AMMONIA,  in the last 168  hours CBC:  Recent Labs Lab 08/01/13 0154 08/01/13 0452 08/03/13 0444  WBC 6.2 6.3 5.6  NEUTROABS 4.7  --   --   HGB 12.3* 11.7* 12.2*  HCT 39.4 37.1* 38.5*  MCV 90.2 89.0 88.9  PLT 150 168 130*   Cardiac Enzymes:  Recent Labs Lab 08/01/13 0154  TROPONINI <0.30    BNP (last 3 results)  Recent Labs  11/26/12 0600 03/10/13 1630 08/01/13 0154  PROBNP 596.6* 58.0 265.8*   CBG:  Recent Labs Lab 08/01/13 0510 08/01/13 0928 08/03/13 1405  GLUCAP 138* 130* 123*    Radiological Exams on Admission: Dg Chest Port 1 View  08/03/2013   CLINICAL DATA:  Chest pain.  EXAM: PORTABLE CHEST - 1 VIEW  COMPARISON:  Chest radiograph performed 08/01/2013  FINDINGS: The lungs are well-aerated. Vascular  congestion is noted. Mild bilateral atelectasis is noted. There is no evidence of pleural effusion or pneumothorax.  The cardiomediastinal silhouette is mildly enlarged. No acute osseous abnormalities are seen. A tracheostomy tube is seen ending 5 cm above the carina.  IMPRESSION: Vascular congestion and mild cardiomegaly. Mild bilateral atelectasis noted.   Electronically Signed   By: Garald Balding M.D.   On: 08/03/2013 05:06    EKG: Independently reviewed. Left anterior fascicular block, seen on previous  Assessment/Plan Present on Admission:  . Altered mental status: ABG notes mild hypercarbia, overall, patient looks closer to baseline  . CTD stage III: Currently creatinine at baseline.  Rulon Sera, morbid: Patient needs criteria with BMI greater than 40  . HYPERTENSION: Continue home meds  . Chronic respiratory failure: Secondary to obesity hypoventilation syndrome: On tracheostomy with ventilator at night. Pulmonary 4 settings.  . Diabetic gastroparesis: On Reglan.  . Coagulopathy: Suspect secondary hepatic congestion from acute diastolic heart failure. Pharmacy to help adjust Coumadin. Start diuresing.  . Acute on chronic diastolic heart failure: Awaiting BNP. Given  elevated at INR, highly suspicious. Daily weights. Strict input and output. IV Lasix.  Consultants: Pulmonary medicine for vent management  Code Status: Full code  Family Communication: Spoke with daughter by phone  Disposition Plan: Likely here for several days for diuresis and then will need placement  Time spent: 45 minutes  Annita Brod [facility] Pager [phone]  **Disclaimer: This note may have been dictated with voice recognition software. Similar sounding words can inadvertently be transcribed and this note may contain transcription errors which may not have been corrected upon publication of note.**

## 2013-08-03 NOTE — ED Notes (Signed)
Family members had to leave, daughter [name] can be reached at [phone].

## 2013-08-03 NOTE — ED Notes (Signed)
Mardene Speak RN at bedside

## 2013-08-03 NOTE — ED Notes (Addendum)
I just spoke with Excell Seltzer and she confirmed IV team was contacted and they are coming to ED to assess pt for IV placement

## 2013-08-03 NOTE — ED Notes (Signed)
Attempted report to [facility], RN will call me back

## 2013-08-03 NOTE — ED Notes (Addendum)
Trach patient. Patient was here yesterday for lethargy, confusion and hypercapnia. EDP yesterday planned to admit for hypercapnia and had paged critical care, but patient was discharged home. Patient symptoms have been getting worse today, patient is not aware of month and thought he was in hospital when he was at home. Patient answers yes/no questions and follows commands once aroused. CBG 154, BP 149/%, but when trach was suctioned and patient put on blow-by o2 and sats came up to 99%

## 2013-08-04 LAB — BASIC METABOLIC PANEL
Anion gap: 15 (ref 5–15)
BUN: 18 mg/dL (ref 6–23)
CHLORIDE: 99 meq/L (ref 96–112)
CO2: 28 mEq/L (ref 19–32)
Calcium: 9.2 mg/dL (ref 8.4–10.5)
Creatinine, Ser: 1.34 mg/dL (ref 0.50–1.35)
GFR calc non Af Amer: 53 mL/min — ABNORMAL LOW (ref >90)
GFR, EST AFRICAN AMERICAN: 62 mL/min — AB (ref >90)
Glucose, Bld: 152 mg/dL — ABNORMAL HIGH (ref 70–99)
POTASSIUM: 4.4 meq/L (ref 3.7–5.3)
Sodium: 142 mEq/L (ref 137–147)

## 2013-08-04 LAB — PROTIME-INR
INR: 4.01 — AB (ref 0.00–1.49)
Prothrombin Time: 39.1 seconds — ABNORMAL HIGH (ref 11.6–15.2)

## 2013-08-04 LAB — GLUCOSE, CAPILLARY
GLUCOSE-CAPILLARY: 168 mg/dL — AB (ref 70–99)
GLUCOSE-CAPILLARY: 176 mg/dL — AB (ref 70–99)
Glucose-Capillary: 114 mg/dL — ABNORMAL HIGH (ref 70–99)
Glucose-Capillary: 210 mg/dL — ABNORMAL HIGH (ref 70–99)

## 2013-08-04 MED ORDER — OXYCODONE-ACETAMINOPHEN 10-325 MG PO TABS: 1.0000 | ORAL_TABLET | Freq: Four times a day (QID) | ORAL | Status: DC | PRN

## 2013-08-04 MED ORDER — OXYCODONE HCL 5 MG PO TABS
10.0000 mg | ORAL_TABLET | Freq: Four times a day (QID) | ORAL | Status: DC | PRN
  Administered 2013-08-04 – 2013-08-12 (×16): 10 mg via ORAL
  Filled 2013-08-04 (×16): qty 2

## 2013-08-04 MED ORDER — POTASSIUM CHLORIDE CRYS ER 20 MEQ PO TBCR
20.0000 meq | EXTENDED_RELEASE_TABLET | Freq: Every day | ORAL | Status: DC
  Administered 2013-08-05 – 2013-08-12 (×8): 20 meq via ORAL
  Filled 2013-08-04 (×8): qty 1

## 2013-08-04 MED ORDER — ACETAMINOPHEN 500 MG PO TABS: 500.0000 mg | ORAL_TABLET | ORAL | Status: DC | PRN

## 2013-08-04 NOTE — Progress Notes (Signed)
ANTICOAGULATION CONSULT NOTE - Follow Up Consult  Pharmacy Consult for Coumadin Indication: hx recurrent PE  No Known Allergies  Patient Measurements: Height: 6' 5.17" (196 cm) Weight: 368 lb 9.8 oz (167.2 kg) IBW/kg (Calculated) : 89.48  Vital Signs: Temp: 97.9 F (36.6 C) (07/20 0914) Temp src: Oral (07/20 0914) BP: 271/82 mmHg (07/20 0914) Pulse Rate: 77 (07/20 1144)  Labs:  Recent Labs  08/03/13 0444 08/03/13 1242 08/04/13 0327  HGB 12.2*  --   --   HCT 38.5*  --   --   PLT 130*  --   --   LABPROT  --  42.2* 39.1*  INR  --  4.43* 4.01*  CREATININE 1.53*  --  1.34    Estimated Creatinine Clearance: 91.3 ml/min (by C-G formula based on Cr of 1.34).  Assessment:   INR supratherapeutic (4.43) on admit yesterday, down to 4.01 today.   Home Coumadin regimen: 7.5 mg daily; just decreased from 10 mg daily when in ED on 7/17 and INR was 4.52.  Goal of Therapy:  INR 2-3 Monitor platelets by anticoagulation protocol: Yes   Plan:   Hold Coumadin again today.  Daily PT/INR.  Arty Baumgartner, [place] Pager: [phone] 08/04/2013,11:51 AM

## 2013-08-04 NOTE — Progress Notes (Signed)
Utilization review completed. Chelli Yerkes, RN, BSN. 

## 2013-08-04 NOTE — Progress Notes (Signed)
[facility] TEAM 1 - Stepdown/ICU TEAM Progress Note  Ladarrious Kirksey SPQ:[id number] DOB: 1945/10/08 DOA: 08/03/2013 PCP: Renato Shin, MD  Admit HPI / Brief Narrative: 68 y.o. 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 Patient was seen in the emergency room 2 days prior for similar complaints and his tracheostomy was found to have a mucous plug which was cleared. Patient went home on oral antibiotics. He has had several admissions during the past year and has refused skilled nursing when previously offered .  In the emergency room the patient was noted to have mild acute respiratory failure with a PCO2 in the 60s as well as an INR of 4.3. Family indicated that they would like for patient to be placed in a SNF.  HPI/Subjective: Pt is awake and alert at this time.  He is aware that he has been intermittently confused, and agrees that presently his family is unable to care for him at home.  He agrees with SNF placement, and hopes to regain ambulatory status.  He c/o severe B knee pain which he says limits his ability to walk.  He currently denies cp, sob, f/c, n/v, or abdom pain.    Assessment/Plan:  Altered mental status  Resolved at this time - follow - check J33 and folic acid   CKD stage III baseline creatinine 1.3-2 - renal function currently stable   Acute on chronic diastolic heart failure EF 50-55% via TTE April 2015, w/ grade 1 DD - negative 3L since admit already - weight 167kg which appears to be his baseline since June of this year   Morbid obesity - Body mass index is 43.52 kg/(m^2).  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 No acute complications at present  Chronic hypercarbic resp  failure / OHS / Sleep apnea s/p trach / chronic nocturnal vent support Vent support per PCCM  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  CBG reasonably controlled at present   HTN - uncontrolled  Resume all home meds and follow trend   HLD Cont home medical tx  Severe B knee pain Most c/w osteoarthritis due to moribid obesity - PT/OT - pain control - no exam findings to suggest effusion - had 4 view xrays of B knees in March c/w severe osteoarthritis   Code Status: FULL Family Communication: no family present at time of exam Disposition Plan: stabilize medically over next 48hrs then d/c to SNF   Consultants: PCCM - vent management only   Procedures: none  Antibiotics: none  DVT prophylaxis: Warfarin   Objective: Blood pressure 169/112, pulse 71, temperature 98 F (36.7 C), temperature source Oral, resp. rate 21, height 6' 5.17" (1.96 m), weight 167.2 kg (368 lb 9.8 oz), SpO2 100.00%.  Intake/Output Summary (Last 24 hours) at 08/04/13 0902 Last data filed at 08/04/13 5456  Gross per 24 hour  Intake    670 ml  Output   3050 ml  Net  -2380 ml   Exam: General: No acute respiratory distress - alert and conversant  Lungs: very distant breath sounds - no appreciable wheezes or focal crackles  Cardiovascular: very distant heart sounds - regular rate and rythmn  Abdomen: morbidly obese - nontender, soft, bowel sounds positive, no rebound Extremities: No significant cyanosis, clubbing;  1+ edema bilateral lower extremities w/ change c/w chronic venous stasis dermatitis  Muskuloskeletal:  No appreciable effusion - no calor or erythema   Data Reviewed: Basic Metabolic Panel:  Recent Labs Lab 08/01/13 0154 08/01/13 0452 08/03/13 0444 08/04/13 0327  NA 139 142 140 142  K 5.8* 5.1 4.3 4.4  CL 102 103 100 99  CO2 26 30 24 28   GLUCOSE 148* 156* 146* 152*  BUN 29* 29* 25* 18  CREATININE 1.88* 1.89* 1.53* 1.34  CALCIUM 9.1 8.9 9.0 9.2  MG  --  2.2  --   --     PHOS  --  2.7  --   --     Liver Function Tests: No results found for this basename: AST, ALT, ALKPHOS, BILITOT, PROT, ALBUMIN,  in the last 168 hours  Coags:  Recent Labs Lab 08/01/13 0452 08/03/13 1242 08/04/13 0327  INR 4.52* 4.43* 4.01*   No results found for this basename: PTT,  in the last 168 hours  CBC:  Recent Labs Lab 08/01/13 0154 08/01/13 0452 08/03/13 0444  WBC 6.2 6.3 5.6  NEUTROABS 4.7  --   --   HGB 12.3* 11.7* 12.2*  HCT 39.4 37.1* 38.5*  MCV 90.2 89.0 88.9  PLT 150 168 130*   Cardiac Enzymes:  Recent Labs Lab 08/01/13 0154  TROPONINI <0.30   CBG:  Recent Labs Lab 08/01/13 0928 08/03/13 1405 08/03/13 1712 08/03/13 2149 08/04/13 0812  GLUCAP 130* 123* 160* 138* 114*    Recent Results (from the past 240 hour(s))  MRSA PCR SCREENING     Status: None   Collection Time    08/03/13  3:03 PM      Result Value Ref Range Status   MRSA by PCR NEGATIVE  NEGATIVE Final   Comment:            The GeneXpert MRSA Assay (FDA     approved for NASAL specimens     only), is one component of a     comprehensive MRSA colonization     surveillance program. It is not     intended to diagnose MRSA     infection nor to guide or     monitor treatment for     MRSA infections.     Studies:  Recent x-ray studies have been reviewed in detail by the Attending Physician  Scheduled Meds:  Scheduled Meds: . alprazolam  2 mg Oral QHS  . amLODipine  10 mg Oral Daily  . carvedilol  6.25 mg Oral BID WC  . citalopram  20 mg Oral Daily  . cloNIDine  0.1 mg Oral BID  . furosemide  20 mg Intravenous BID  . insulin glargine  20 Units Subcutaneous q morning - 10a  . losartan  100 mg Oral Daily  . metoCLOPramide  5 mg Oral TID  . potassium chloride SA  40 mEq Oral Daily  . simvastatin  20 mg Oral q1800  . sodium chloride  3 mL Intravenous Q12H  . Warfarin - Pharmacist Dosing Inpatient   Does not apply q1800    Time spent on care of this patient: 35  mins   [facility] T , MD   [facility] Office  [phone] Pager - Text Page per Amion as per below:  On-Call/Text Page:      [email or web address]      password [email or web address]  If 7PM-7AM, please contact night-coverage [email or web address] Password [email or web address] 08/04/2013, 9:02 AM   LOS: 1 day

## 2013-08-04 NOTE — Progress Notes (Signed)
Pt refused to be suctioned at this time. Pt stated he was fine. RT will continue to monitor. BBS were clear, diminished. SPo2 on 28% ATC was 96%.

## 2013-08-05 ENCOUNTER — Ambulatory Visit: Payer: Medicare Other | Admitting: Endocrinology

## 2013-08-05 DIAGNOSIS — Z0289 Encounter for other administrative examinations: Secondary | ICD-10-CM

## 2013-08-05 DIAGNOSIS — N183 Chronic kidney disease, stage 3 unspecified: Secondary | ICD-10-CM

## 2013-08-05 DIAGNOSIS — Z86711 Personal history of pulmonary embolism: Secondary | ICD-10-CM

## 2013-08-05 DIAGNOSIS — I509 Heart failure, unspecified: Secondary | ICD-10-CM

## 2013-08-05 DIAGNOSIS — I5033 Acute on chronic diastolic (congestive) heart failure: Principal | ICD-10-CM

## 2013-08-05 LAB — CBC
HCT: 36.6 % — ABNORMAL LOW (ref 39.0–52.0)
Hemoglobin: 12 g/dL — ABNORMAL LOW (ref 13.0–17.0)
MCH: 27.6 pg (ref 26.0–34.0)
MCHC: 32.8 g/dL (ref 30.0–36.0)
MCV: 84.3 fL (ref 78.0–100.0)
Platelets: 186 10*3/uL (ref 150–400)
RBC: 4.34 MIL/uL (ref 4.22–5.81)
RDW: 12.7 % (ref 11.5–15.5)
WBC: 5.1 10*3/uL (ref 4.0–10.5)

## 2013-08-05 LAB — LIPID PANEL
Cholesterol: 115 mg/dL (ref 0–200)
HDL: 39 mg/dL — AB (ref >39)
LDL Cholesterol: 53 mg/dL (ref 0–99)
TRIGLYCERIDES: 113 mg/dL (ref <150)
Total CHOL/HDL Ratio: 2.9 RATIO
VLDL: 23 mg/dL (ref 0–40)

## 2013-08-05 LAB — BASIC METABOLIC PANEL
ANION GAP: 12 (ref 5–15)
BUN: 20 mg/dL (ref 6–23)
CALCIUM: 8.8 mg/dL (ref 8.4–10.5)
CO2: 27 mEq/L (ref 19–32)
CREATININE: 1.47 mg/dL — AB (ref 0.50–1.35)
Chloride: 97 mEq/L (ref 96–112)
GFR calc non Af Amer: 48 mL/min — ABNORMAL LOW (ref >90)
GFR, EST AFRICAN AMERICAN: 55 mL/min — AB (ref >90)
Glucose, Bld: 145 mg/dL — ABNORMAL HIGH (ref 70–99)
Potassium: 4.1 mEq/L (ref 3.7–5.3)
Sodium: 136 mEq/L — ABNORMAL LOW (ref 137–147)

## 2013-08-05 LAB — GLUCOSE, CAPILLARY
Glucose-Capillary: 149 mg/dL — ABNORMAL HIGH (ref 70–99)
Glucose-Capillary: 168 mg/dL — ABNORMAL HIGH (ref 70–99)

## 2013-08-05 LAB — PROTIME-INR
INR: 2.82 — ABNORMAL HIGH (ref 0.00–1.49)
Prothrombin Time: 29.7 seconds — ABNORMAL HIGH (ref 11.6–15.2)

## 2013-08-05 LAB — VITAMIN B12: VITAMIN B 12: 1150 pg/mL — AB (ref 211–911)

## 2013-08-05 MED ORDER — WARFARIN SODIUM 7.5 MG PO TABS
7.5000 mg | ORAL_TABLET | Freq: Once | ORAL | Status: AC
  Administered 2013-08-05: 7.5 mg via ORAL
  Filled 2013-08-05: qty 1

## 2013-08-05 MED ORDER — SIMVASTATIN 20 MG PO TABS
20.0000 mg | ORAL_TABLET | Freq: Every day | ORAL | Status: DC
  Administered 2013-08-05 – 2013-08-11 (×7): 20 mg via ORAL
  Filled 2013-08-05 (×8): qty 1

## 2013-08-05 NOTE — Evaluation (Signed)
Physical Therapy Evaluation Patient Details Name: Travis Chapman MRN: [id number] DOB: [birth year] Today's Date: 08/05/2013   History of Present Illness  Pt adm with confusion and incr falls. Long term trach - on vent at night. Morbid obesity, DM, CHF, severe arthritis of knees  Clinical Impression  Pt admitted with above. Pt currently with functional limitations due to the deficits listed below (see PT Problem List).  Pt will benefit from skilled PT to increase their independence and safety with mobility to allow discharge to the venue listed below. Pt could benefit from ST-SNF to decr falls at home. Pt now agreeable to SNF.      Follow Up Recommendations SNF    Equipment Recommendations  None recommended by PT    Recommendations for Other Services       Precautions / Restrictions Precautions Precautions: Fall      Mobility  Bed Mobility Overal bed mobility: Needs Assistance Bed Mobility: Supine to Sit;Sit to Supine     Supine to sit: +2 for physical assistance;Min assist;HOB elevated Sit to supine: +2 for physical assistance;Min assist   General bed mobility comments: Assist to bring trunk up into sitting and hips to EOB. Assist to bring legs back up into bed.  Transfers Overall transfer level: Needs assistance Equipment used: Rolling walker (2 wheeled) Transfers: Sit to/from Stand Sit to Stand: +2 physical assistance;Min assist         General transfer comment: Assist to bring hips up.  Ambulation/Gait Ambulation/Gait assistance: +2 physical assistance;Min assist Ambulation Distance (Feet): 2 Feet Assistive device: Rolling walker (2 wheeled)       General Gait Details: Side stepped up EOB.  Stairs            Wheelchair Mobility    Modified Rankin (Stroke Patients Only)       Balance Overall balance assessment: Needs assistance Sitting-balance support: No upper extremity supported;Feet supported       Standing balance support: Bilateral upper  extremity supported Standing balance-Leahy Scale: Poor Standing balance comment: Stood with walker x 60 sec with +2 min A.                             Pertinent Vitals/Pain VSS. Pt on trach collar.    Home Living Family/patient expects to be discharged to:: Skilled nursing facility Living Arrangements: Parent Available Help at Discharge: Family;Available 24 hours/day Type of Home: House Home Access: Stairs to enter Entrance Stairs-Rails: Right;Left;Can reach both Entrance Stairs-Number of Steps: 3 Home Layout: One level Home Equipment: Walker - 2 wheels      Prior Function Level of [place]: Needs assistance   Gait / Transfers Assistance Needed: Amb with rolling walker but with falls at home. Calls 911 when falls.           Hand Dominance        Extremity/Trunk Assessment   Upper Extremity Assessment: Defer to OT evaluation           Lower Extremity Assessment: Generalized weakness         Communication      Cognition Arousal/Alertness: Awake/alert Behavior During Therapy: WFL for tasks assessed/performed Overall Cognitive Status: Within Functional Limits for tasks assessed                      General Comments      Exercises        Assessment/Plan    PT Assessment Patient needs continued PT  services  PT Diagnosis Difficulty walking;Generalized weakness   PT Problem List Decreased strength;Decreased activity tolerance;Decreased balance;Decreased mobility;Obesity  PT Treatment Interventions DME instruction;Gait training;Functional mobility training;Therapeutic activities;Therapeutic exercise;Balance training;Patient/family education   PT Goals (Current goals can be found in the Care Plan section) Acute Rehab PT Goals Patient Stated Goal: Be able to walk without falling PT Goal Formulation: With patient Time For Goal Achievement: 08/12/13 Potential to Achieve Goals: Good    Frequency Min 3X/week   Barriers to  discharge Decreased caregiver support      Co-evaluation               End of Session Equipment Utilized During Treatment: Oxygen Activity Tolerance: Patient tolerated treatment well Patient left: in bed;with call bell/phone within reach Nurse Communication: Mobility status         Time: 2060-1561 PT Time Calculation (min): 27 min   Charges:   PT Evaluation $Initial PT Evaluation Tier I: 1 Procedure PT Treatments $Gait Training: 23-37 mins   PT G Codes:          Casimiro Lienhard 08/17/2013, 10:21 AM  Suanne Marker PT [phone]

## 2013-08-05 NOTE — Progress Notes (Signed)
[place] TEAM 1 - Stepdown/ICU TEAM Progress Note  Travis Chapman WUX:[id number] DOB: 08-31-1945 DOA: 08/03/2013 PCP: Renato Shin, MD  Admit HPI / Brief Narrative: 68 y.o. 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 Patient was seen in the emergency room 2 days prior for similar complaints and his tracheostomy was found to have a mucous plug which was cleared. Patient went home on oral antibiotics. He has had several admissions during the past year and has refused skilled nursing when previously offered  In the emergency room the patient was noted to have mild acute respiratory failure with a PCO2 in the 60s as well as an INR of 4.3. Family indicated that they would like for patient to be placed in a SNF.   HPI/Subjective: 7/21 patient A./O. x4, NAD, states knees have been giving out on him, and agrees to be placed in a SNF.   Assessment/Plan: Altered mental status  -Resolved at this time  - O53 : High  -folic acid pending  CKD stage III  -baseline creatinine 1.3-2 - renal function currently stable  -7/21 creatinine= 1.47  Acute on chronic diastolic heart failure  -EF 50-55% via TTE April 2015, w/ grade 1 DD  - negative 3L since admit already  - Admission weight 167kg, 7/21 weight bed= 160.6 kg which appears to be his baseline since June of this year   Morbid obesity - Body mass index is 43.52 kg/(m^2).   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  -CBG reasonably controlled at present  -obtain hemoglobin A1c -Obtain lipid panel  HTN - uncontrolled  -Continue amlodipine 10 mg daily  -Continue Coreg 6.25 mg  BID -Continue clonidine 0.1 mg  BID -Continue Lasix 20 mg  BID -Continue losartan 100 mg daily   HLD  -Continue Zocor 20 mg daily  -  Severe B knee pain  -Most c/w osteoarthritis due to moribid obesity  - PT/OT consulted - pain control  - no exam findings to suggest effusion  - had 4 view xrays of B knees in March c/w severe osteoarthritis   Code Status: FULL  Family Communication: no family present at time of exam  Disposition Plan: stabilize medically over next 48hrs then d/c to SNF      Consultants: NA  Procedure/Significant Events: 7/19 PCXR;Vascular congestion and mild cardiomegaly. Mild bilateral atelectasis    Culture NA  Antibiotics: NA  DVT prophylaxis: Coumadin   Devices Chronic trach   LINES / TUBES:  7/19 20ga right hand    Continuous Infusions:   Objective: VITAL SIGNS: Temp: 98.2 F (36.8 C) (07/21 1140) Temp src: Oral (07/21 1140) BP: 137/74 mmHg (07/21 1140) Pulse Rate: 82 (07/21 1140) SPO2; 100% on 5 L O2 trach collar FIO2: 28%   Intake/Output Summary (Last 24 hours) at 08/05/13 1240 Last data filed at 08/05/13 1205  Gross per 24 hour  Intake    483 ml  Output   1050 ml  Net   -567  ml     Exam: General: A./O. x4, sitting comfortably in bed on a vent eating his lunch, No acute respiratory distress Lungs: Clear to auscultation bilaterally without wheezes or crackles Cardiovascular: Regular rate and rhythm without murmur gallop or rub normal S1 and S2 Abdomen: Morbidly obese, Nontender, nondistended, soft, bowel sounds positive, no rebound, no ascites, no appreciable mass Extremities: No significant cyanosis, clubbing, or edema bilateral lower extremities  Data Reviewed: Basic Metabolic Panel:  Recent Labs Lab  08/01/13 0154 08/01/13 0452 08/03/13 0444 08/04/13 0327 08/05/13 0324  NA 139 142 140 142 136*  K 5.8* 5.1 4.3 4.4 4.1  CL 102 103 100 99 97  CO2 26 30 24 28 27   GLUCOSE 148* 156* 146* 152* 145*  BUN 29* 29* 25* 18 20  CREATININE 1.88* 1.89* 1.53* 1.34 1.47*  CALCIUM 9.1 8.9 9.0 9.2 8.8  MG  --  2.2  --   --   --   PHOS  --  2.7  --   --   --    Liver Function Tests: No results found for this basename: AST, ALT, ALKPHOS, BILITOT, PROT, ALBUMIN,  in the last 168 hours No results found for this basename: LIPASE, AMYLASE,  in the last 168 hours No results found for this basename: AMMONIA,  in the last 168 hours CBC:  Recent Labs Lab 08/01/13 0154 08/01/13 0452 08/03/13 0444 08/05/13 0324  WBC 6.2 6.3 5.6 5.1  NEUTROABS 4.7  --   --   --   HGB 12.3* 11.7* 12.2* 12.0*  HCT 39.4 37.1* 38.5* 36.6*  MCV 90.2 89.0 88.9 84.3  PLT 150 168 130* 186   Cardiac Enzymes:  Recent Labs Lab 08/01/13 0154  TROPONINI <0.30   BNP (last 3 results)  Recent Labs  03/10/13 1630 08/01/13 0154 08/03/13 1600  PROBNP 58.0 265.8* 844.9*   CBG:  Recent Labs Lab 08/04/13 1157 08/04/13 1619 08/04/13 2102 08/05/13 0731 08/05/13 1147  GLUCAP 168* 176* 210* 149* 168*    Recent Results (from the past 240 hour(s))  MRSA PCR SCREENING     Status: None   Collection Time    08/03/13  3:03 PM      Result Value Ref Range Status   MRSA by PCR NEGATIVE  NEGATIVE Final   Comment:            The GeneXpert MRSA Assay (FDA     approved for NASAL specimens     only), is one component of a     comprehensive MRSA colonization     surveillance program. It is not     intended to diagnose MRSA     infection nor to guide or     monitor treatment for     MRSA infections.     Studies:  Recent x-ray studies have been reviewed in detail by the Attending Physician  Scheduled Meds:  Scheduled Meds: . alprazolam  2 mg Oral QHS  . amLODipine  10 mg Oral Daily  . carvedilol  6.25 mg Oral BID  WC  . citalopram  20 mg Oral Daily  . cloNIDine  0.1 mg Oral BID  . furosemide  20 mg Intravenous BID  . insulin glargine  20 Units Subcutaneous q morning - 10a  . losartan  100 mg Oral Daily  . metoCLOPramide  5 mg Oral TID  . potassium chloride SA  20 mEq Oral Daily  . sodium chloride  3 mL Intravenous Q12H  . warfarin  7.5  mg Oral ONCE-1800  . Warfarin - Pharmacist Dosing Inpatient   Does not apply q1800    Time spent on care of this patient: 40 mins   Allie Bossier , MD   [facility] Office  [phone] Pager [phone]  On-Call/Text Page:      [email or web address]      password [email or web address]  If 7PM-7AM, please contact night-coverage [email or web address] Password [email or web address] 08/05/2013, 12:40 PM   LOS: 2 days

## 2013-08-05 NOTE — Progress Notes (Signed)
Pt wants rt it return at 10pm to place on vent.

## 2013-08-05 NOTE — Progress Notes (Signed)
ANTICOAGULATION CONSULT NOTE - Follow Up Consult  Pharmacy Consult for Coumadin Indication: hx recurrent PE  No Known Allergies  Patient Measurements: Height: 6' 5.17" (196 cm) Weight: 354 lb 0.9 oz (160.6 kg) IBW/kg (Calculated) : 89.48  Vital Signs: Temp: 98.3 F (36.8 C) (07/21 0734) Temp src: Oral (07/21 0734) BP: 137/74 mmHg (07/21 1100) Pulse Rate: 75 (07/21 1100)  Labs:  Recent Labs  08/03/13 0444 08/03/13 1242 08/04/13 0327 08/05/13 0324 08/05/13 0500  HGB 12.2*  --   --  12.0*  --   HCT 38.5*  --   --  36.6*  --   PLT 130*  --   --  186  --   LABPROT  --  42.2* 39.1*  --  29.7*  INR  --  4.43* 4.01*  --  2.82*  CREATININE 1.53*  --  1.34 1.47*  --     Estimated Creatinine Clearance: 81.3 ml/min (by C-G formula based on Cr of 1.47).  Assessment: 25 yom admitted for increased confusion/frequent falls. Pharmacy to dose coumadin for hx of PE. After several held doses, INR therapeutic (2.82) today, down from 4.01 yesterday. Home Coumadin regimen: 7.5 mg daily. No bleeding documented.  Goal of Therapy:  INR 2-3 Monitor platelets by anticoagulation protocol: Yes   Plan:   Restart warfarin 7.5 mg po x 1 dose tonight  Daily PT/INR.  Monitor s/s bleeding  Romona Curls, [place] Pager: [phone] 08/05/2013,11:30 AM

## 2013-08-05 NOTE — Evaluation (Signed)
Occupational Therapy Evaluation Patient Details Name: Travis Chapman MRN: [id number] DOB: 01/16/1946 Today's Date: 08/05/2013    History of Present Illness Pt adm with confusion and incr falls. Long term trach - on vent at night. Morbid obesity, DM, CHF, severe arthritis of knees   Clinical Impression   Pt admitted with above. He demonstrates the below listed deficits and will benefit from continued OT to maximize safety and independence with BADLs.  Pt participation in OT eval limited by bil. Knee pain which he reports was exacerbated by working with PT earlier.  He currently requires max A for LB ADLs.   He is agreeable to SNF per our conversation and feel this would be the most appropriate d/c disposition.  Will follow acutely       Follow Up Recommendations  SNF    Equipment Recommendations  None recommended by OT    Recommendations for Other Services       Precautions / Restrictions Precautions Precautions: Fall      Mobility Bed Mobility Overal bed mobility: Needs Assistance Bed Mobility: Supine to Sit;Sit to Supine     Supine to sit: Mod assist Sit to supine: Min assist   General bed mobility comments: Pt requires assist to move LEs to EOB and assist to lift trunk from bed  Transfers Overall transfer level: Needs assistance Equipment used: Rolling walker (2 wheeled) Transfers: Sit to/from Stand Sit to Stand: +2 physical assistance;Min assist         General transfer comment: Assist to bring hips up.    Balance Overall balance assessment: Needs assistance Sitting-balance support: Feet supported Sitting balance-Leahy Scale: Poor Sitting balance - Comments: Requires min A to maintain EOB balance   Standing balance support: Bilateral upper extremity supported Standing balance-Leahy Scale: Poor Standing balance comment: Stood with walker x 60 sec with +2 min A.                            ADL Overall ADL's : Needs  assistance/impaired Eating/Feeding: Modified independent;Sitting   Grooming: Wash/dry hands;Wash/dry face;Oral care;Brushing hair;Set up;Sitting;Bed level   Upper Body Bathing: Set up;Sitting;Bed level   Lower Body Bathing: Maximal assistance;Sit to/from stand;Sitting/lateral leans   Upper Body Dressing : Minimal assistance;Sitting   Lower Body Dressing: Maximal assistance;Sit to/from stand;Sitting/lateral leans   Toilet Transfer: +2 for physical assistance;Stand-pivot;BSC   Toileting- Clothing Manipulation and Hygiene: Maximal assistance;Sit to/from stand       Functional mobility during ADLs: Minimal assistance;+2 for physical assistance;Rolling walker General ADL Comments: Pt limited with OT due to LE pain - he reports that mobility earlier with PT flared up his knees     Vision                     Perception     Praxis      Pertinent Vitals/Pain See vitals flow sheet.      Hand Dominance Right   Extremity/Trunk Assessment Upper Extremity Assessment Upper Extremity Assessment: Overall WFL for tasks assessed   Lower Extremity Assessment Lower Extremity Assessment: Defer to PT evaluation   Cervical / Trunk Assessment Cervical / Trunk Assessment: Normal   Communication Communication Communication: Tracheostomy   Cognition Arousal/Alertness: Awake/alert Behavior During Therapy: WFL for tasks assessed/performed Overall Cognitive Status: Within Functional Limits for tasks assessed                     General Comments  Exercises       Shoulder Instructions      Home Living Family/patient expects to be discharged to:: Skilled nursing facility Living Arrangements: Parent Available Help at Discharge: Family;Available PRN/intermittently Type of Home: House Home Access: Stairs to enter [employer] of Steps: 3 Entrance Stairs-Rails: Right;Left;Can reach both Home Layout: One level               Home Equipment: Walker  - 2 wheels   Additional Comments: Pt wears vent at night time and has O2 just in case. Pt has been on vent for 8 years.  Pt lives with his mother who is 68 y.o and cannot assist pt. Family provides assist intermittently       Prior Functioning/Environment Level of Independence: Needs assistance  Gait / Transfers Assistance Needed: Amb with rolling walker but with falls at home. Calls 911 when falls. ADL's / Homemaking Assistance Needed: Pt reports he sponge bathes mod I and is able to dress self.  He does not do househould management activities.  He states his daughters do that Communication / Swallowing Assistance Needed: Pt with trach Comments: Pt reports he falls weekly     OT Diagnosis: Generalized weakness;Acute pain   OT Problem List: Decreased strength;Decreased activity tolerance;Impaired balance (sitting and/or standing);Decreased knowledge of use of DME or AE;Cardiopulmonary status limiting activity;Obesity;Pain   OT Treatment/Interventions: Self-care/ADL training;Energy conservation;DME and/or AE instruction;Therapeutic activities;Patient/family education;Balance training    OT Goals(Current goals can be found in the care plan section) Acute Rehab OT Goals Patient Stated Goal: To regain independence OT Goal Formulation: With patient Time For Goal Achievement: 08/19/13 Potential to Achieve Goals: Good ADL Goals Pt Will Perform Lower Body Bathing: with min assist;with adaptive equipment;sit to/from stand Pt Will Perform Lower Body Dressing: with min assist;with adaptive equipment;sit to/from stand Pt Will Transfer to Toilet: with min assist;ambulating;regular height toilet;bedside commode;grab bars Pt Will Perform Toileting - Clothing Manipulation and hygiene: with min assist;sit to/from stand  OT Frequency: Min 2X/week   Barriers to D/C: Decreased caregiver support          Co-evaluation              End of Session Nurse Communication: Patient requests pain  meds  Activity Tolerance: Patient limited by pain Patient left: in bed;with call bell/phone within reach;with nursing/sitter in room   Time: 6440-3474 OT Time Calculation (min): 26 min Charges:  OT General Charges $OT Visit: 1 Procedure OT Evaluation $Initial OT Evaluation Tier I: 1 Procedure OT Treatments $Therapeutic Activity: 8-22 mins G-Codes:    Cesare Sumlin M Aug 27, 2013, 12:15 PM

## 2013-08-05 NOTE — Care Management Note (Addendum)
    Page 1 of 1   08/08/2013     9:12:51 AM CARE MANAGEMENT NOTE 08/08/2013  Patient:  Travis Chapman, Travis Chapman   Account Number:  [id number]  Date Initiated:  08/05/2013  Documentation initiated by:  Rosela Supak  Subjective/Objective Assessment:   dx AMS/resp failure; lives with mother-brother and dtr assist, chronic trach/nocturnal vent through [employer]     In-house referral  Clinical Social Worker      DC Forensic scientist  CM consult      Medicare Important Message given?  YES (If response is "NO", the following Medicare IM given date fields will be blank) Date Medicare IM given:  08/08/2013 Medicare IM given by:  Tyshun Tuckerman  Per UR Regulation:  Reviewed for med. necessity/level of care/duration of stay  Comments:  08/05/13 [place] RN MSN BSN CCM Pt has been unwilling to consider ST-SNF for rehab previously, had bed @ [facility] vent SNF and refused placement.  Family is now saying he is having frequent falls and they can not provide the care he needs and he is agreeing to placement.  CSW aware.

## 2013-08-06 LAB — FOLATE: FOLATE: 17.8 ng/mL

## 2013-08-06 LAB — HEMOGLOBIN A1C
HEMOGLOBIN A1C: 6.8 % — AB (ref <5.7)
Mean Plasma Glucose: 148 mg/dL — ABNORMAL HIGH (ref <117)

## 2013-08-06 LAB — PROTIME-INR
INR: 1.92 — ABNORMAL HIGH (ref 0.00–1.49)
Prothrombin Time: 22 seconds — ABNORMAL HIGH (ref 11.6–15.2)

## 2013-08-06 MED ORDER — FUROSEMIDE 10 MG/ML IJ SOLN
20.0000 mg | Freq: Every day | INTRAMUSCULAR | Status: DC
  Administered 2013-08-07 – 2013-08-09 (×3): 20 mg via INTRAVENOUS
  Filled 2013-08-06 (×3): qty 2

## 2013-08-06 MED ORDER — WARFARIN SODIUM 7.5 MG PO TABS
7.5000 mg | ORAL_TABLET | Freq: Once | ORAL | Status: AC
  Administered 2013-08-06: 7.5 mg via ORAL
  Filled 2013-08-06: qty 1

## 2013-08-06 NOTE — Progress Notes (Signed)
[place] TEAM 1 - Stepdown/ICU TEAM Progress Note  Travis Chapman GXQ:[id number] DOB: 1945-02-28 DOA: 08/03/2013 PCP: Renato Shin, MD  Admit HPI / Brief Narrative: 68 y.o. 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 Patient was seen in the emergency room 2 days prior for similar complaints and his tracheostomy was found to have a mucous plug which was cleared. Patient went home on oral antibiotics. He has had several admissions during the past year and has refused skilled nursing when previously offered .  In the emergency room the patient was noted to have mild acute respiratory failure with a PCO2 in the 60s as well as an INR of 4.3. Family indicated that they would like for patient to be placed in a SNF.  HPI/Subjective: No new complaints today.  Reconfirms that he is ready for d/c to a SNF>    Assessment/Plan:  Altered mental status  Resolved at this time - follow - X44 high - folic acid pending   CKD stage III baseline creatinine 1.3-2 - renal function currently stable   Acute on chronic diastolic heart failure EF 50-55% via TTE April 2015, w/ grade 1 DD - negative 5L since admit - weight 160kg which appears to be below his baseline since June of this year of ~167  Morbid obesity - Body mass index is 41.81 kg/(m^2).  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 No acute complications at present  Chronic hypercarbic resp failure / OHS / Sleep apnea s/p trach / chronic nocturnal vent support Vent support per PCCM  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  CBG reasonably controlled at present   HTN - uncontrolled  Resumed all home meds - control improving - cont current regimen w/o  change    HLD Cont home medical tx  Severe B knee pain Most c/w osteoarthritis due to moribid obesity - PT/OT - pain control - no exam findings to suggest effusion - had 4 view xrays of B knees in March c/w severe osteoarthritis   Code Status: FULL Family Communication: no family present at time of exam Disposition Plan: medically stable for SNF - awaiting location of a bed for transfer   Consultants: PCCM - vent management only   Procedures: none  Antibiotics: none  DVT prophylaxis: Warfarin   Objective: Blood pressure 144/66, pulse 70, temperature 98.5 F (36.9 C), temperature source Oral, resp. rate 16, height 6' 5.17" (1.96 m), weight 160.6 kg (354 lb 0.9 oz), SpO2 100.00%.  Intake/Output Summary (Last 24 hours) at 08/06/13 1716 Last data filed at 08/06/13 1300  Gross per 24 hour  Intake    480 ml  Output   2602 ml  Net  -2122 ml   Exam: General: No acute respiratory distress - alert and conversant  Lungs: very distant breath sounds - no appreciable wheezes or crackles  Cardiovascular: very distant heart sounds - regular rate and rythmn  Abdomen: morbidly obese - nontender, soft, bowel sounds positive, no rebound Extremities: No significant cyanosis, clubbing; trace edema bilateral lower extremities w/ change c/w chronic venous stasis dermatitis   Data Reviewed: Basic Metabolic Panel:  Recent Labs Lab 08/01/13 0154 08/01/13 0452 08/03/13 0444 08/04/13 0327 08/05/13 0324  NA 139 142 140 142 136*  K 5.8* 5.1 4.3  4.4 4.1  CL 102 103 100 99 97  CO2 26 30 24 28 27   GLUCOSE 148* 156* 146* 152* 145*  BUN 29* 29* 25* 18 20  CREATININE 1.88* 1.89* 1.53* 1.34 1.47*  CALCIUM 9.1 8.9 9.0 9.2 8.8  MG  --  2.2  --   --   --   PHOS  --  2.7  --   --   --     Liver Function Tests: No results found for this basename: AST, ALT, ALKPHOS, BILITOT, PROT, ALBUMIN,  in the last 168 hours  Coags:  Recent Labs Lab 08/01/13 0452 08/03/13 1242 08/04/13 0327  08/05/13 0500 08/06/13 0305  INR 4.52* 4.43* 4.01* 2.82* 1.92*   No results found for this basename: PTT,  in the last 168 hours  CBC:  Recent Labs Lab 08/01/13 0154 08/01/13 0452 08/03/13 0444 08/05/13 0324  WBC 6.2 6.3 5.6 5.1  NEUTROABS 4.7  --   --   --   HGB 12.3* 11.7* 12.2* 12.0*  HCT 39.4 37.1* 38.5* 36.6*  MCV 90.2 89.0 88.9 84.3  PLT 150 168 130* 186   Cardiac Enzymes:  Recent Labs Lab 08/01/13 0154  TROPONINI <0.30   CBG:  Recent Labs Lab 08/04/13 1157 08/04/13 1619 08/04/13 2102 08/05/13 0731 08/05/13 1147  GLUCAP 168* 176* 210* 149* 168*    Recent Results (from the past 240 hour(s))  MRSA PCR SCREENING     Status: None   Collection Time    08/03/13  3:03 PM      Result Value Ref Range Status   MRSA by PCR NEGATIVE  NEGATIVE Final   Comment:            The GeneXpert MRSA Assay (FDA     approved for NASAL specimens     only), is one component of a     comprehensive MRSA colonization     surveillance program. It is not     intended to diagnose MRSA     infection nor to guide or     monitor treatment for     MRSA infections.     Studies:  Recent x-ray studies have been reviewed in detail by the Attending Physician  Scheduled Meds:  Scheduled Meds: . alprazolam  2 mg Oral QHS  . amLODipine  10 mg Oral Daily  . carvedilol  6.25 mg Oral BID WC  . citalopram  20 mg Oral Daily  . cloNIDine  0.1 mg Oral BID  . furosemide  20 mg Intravenous BID  . insulin glargine  20 Units Subcutaneous q morning - 10a  . losartan  100 mg Oral Daily  . metoCLOPramide  5 mg Oral TID  . potassium chloride SA  20 mEq Oral Daily  . simvastatin  20 mg Oral q1800  . sodium chloride  3 mL Intravenous Q12H  . warfarin  7.5 mg Oral ONCE-1800  . Warfarin - Pharmacist Dosing Inpatient   Does not apply q1800    Time spent on care of this patient: 25 mins   [facility] T , MD   [facility] Office  [phone] Pager - Text Page per Amion as per  below:  On-Call/Text Page:      [email or web address]      password [email or web address]  If 7PM-7AM, please contact night-coverage [email or web address] Password [facility] 08/06/2013, 5:16 PM   LOS: 3 days

## 2013-08-06 NOTE — Progress Notes (Signed)
Inpatient Diabetes Program Recommendations  AACE/ADA: New Consensus Statement on Inpatient Glycemic Control (2013)  Target Ranges:  Prepandial:   less than 140 mg/dL      Peak postprandial:   less than 180 mg/dL (1-2 hours)      Critically ill patients:  140 - 180 mg/dL   Results for Travis Chapman, Travis Chapman (MRN [id number]) as of 08/06/2013 07:11  Ref. Range 08/04/2013 08:12 08/04/2013 11:57 08/04/2013 16:19 08/04/2013 21:02 08/05/2013 07:31 08/05/2013 11:47  Glucose-Capillary Latest Range: 70-99 mg/dL 114 (H) 168 (H) 176 (H) 210 (H) 149 (H) 168 (H)   Diabetes history: DM2 Outpatient Diabetes medications: Lantus 20 units QAM Current orders for Inpatient glycemic control: Lantus 20 units QAM  Inpatient Diabetes Program Recommendations Correction (SSI): While inpatient please consider ordering Novolog sensitive correction ACHS.  Thanks, Barnie Alderman, RN, MSN, CCRN Diabetes Coordinator Inpatient Diabetes Program [phone] (Team Pager) [phone] ([facility] office) [phone] [facility] office)

## 2013-08-06 NOTE — Progress Notes (Signed)
7.0 XLTCD at [facility].

## 2013-08-06 NOTE — Progress Notes (Signed)
ANTICOAGULATION CONSULT NOTE - Follow Up Consult  Pharmacy Consult for Coumadin Indication: hx recurrent PE  No Known Allergies  Patient Measurements: Height: 6' 5.17" (196 cm) Weight: 354 lb 0.9 oz (160.6 kg) IBW/kg (Calculated) : 89.48  Vital Signs: Temp: 98 F (36.7 C) (07/22 0419) Temp src: Oral (07/22 0419) BP: 148/92 mmHg (07/22 0802) Pulse Rate: 71 (07/22 0802)  Labs:  Recent Labs  08/04/13 0327 08/05/13 0324 08/05/13 0500 08/06/13 0305  HGB  --  12.0*  --   --   HCT  --  36.6*  --   --   PLT  --  186  --   --   LABPROT 39.1*  --  29.7* 22.0*  INR 4.01*  --  2.82* 1.92*  CREATININE 1.34 1.47*  --   --     Estimated Creatinine Clearance: 81.3 ml/min (by C-G formula based on Cr of 1.47).  Assessment: 22 yom admitted for increased confusion/frequent falls. Pharmacy to dose coumadin for hx of PE. After several held doses, INR subtherapeutic (1.92) today, down from 2.82 yesterday. Home Coumadin regimen: 7.5 mg daily. No bleeding documented.  Goal of Therapy:  INR 2-3 Monitor platelets by anticoagulation protocol: Yes   Plan:   Continue warfarin 7.5 mg po x 1 dose tonight  Daily PT/INR.  Monitor s/s bleeding  Romona Curls, [place] Pager: [phone] 08/06/2013,8:28 AM

## 2013-08-06 NOTE — Progress Notes (Signed)
RT suctioned PT x1. Resulted in small, white, thick sputum. Sp02 remained stable.

## 2013-08-06 NOTE — Progress Notes (Signed)
Removed pt from vent per pr request.  Pt did not want TC put on.  Pt on RA at this time sats=97% Rt will continue to monitor.

## 2013-08-06 NOTE — Progress Notes (Signed)
RT suctioned PT x1. Resulted in small, white, thick sputum.PT tolerated well and Sp02 remained stable on RA. PT refused to utilize ATC at this time. Charge RT aware that spare trach in room is 7.0 XLTCP.

## 2013-08-06 NOTE — Progress Notes (Signed)
Pt refuses to have drain sponge and to wear ATC

## 2013-08-07 DIAGNOSIS — I10 Essential (primary) hypertension: Secondary | ICD-10-CM

## 2013-08-07 DIAGNOSIS — I2699 Other pulmonary embolism without acute cor pulmonale: Secondary | ICD-10-CM

## 2013-08-07 LAB — BASIC METABOLIC PANEL
ANION GAP: 13 (ref 5–15)
BUN: 24 mg/dL — ABNORMAL HIGH (ref 6–23)
CHLORIDE: 96 meq/L (ref 96–112)
CO2: 25 mEq/L (ref 19–32)
CREATININE: 1.48 mg/dL — AB (ref 0.50–1.35)
Calcium: 8.6 mg/dL (ref 8.4–10.5)
GFR calc Af Amer: 55 mL/min — ABNORMAL LOW (ref >90)
GFR, EST NON AFRICAN AMERICAN: 47 mL/min — AB (ref >90)
Glucose, Bld: 173 mg/dL — ABNORMAL HIGH (ref 70–99)
Potassium: 4.1 mEq/L (ref 3.7–5.3)
Sodium: 134 mEq/L — ABNORMAL LOW (ref 137–147)

## 2013-08-07 LAB — PROTIME-INR
INR: 1.88 — AB (ref 0.00–1.49)
Prothrombin Time: 21.6 seconds — ABNORMAL HIGH (ref 11.6–15.2)

## 2013-08-07 LAB — GLUCOSE, CAPILLARY
Glucose-Capillary: 147 mg/dL — ABNORMAL HIGH (ref 70–99)
Glucose-Capillary: 194 mg/dL — ABNORMAL HIGH (ref 70–99)
Glucose-Capillary: 164 mg/dL — ABNORMAL HIGH (ref 70–99)
Glucose-Capillary: 209 mg/dL — ABNORMAL HIGH (ref 70–99)

## 2013-08-07 MED ORDER — WARFARIN SODIUM 10 MG PO TABS
10.0000 mg | ORAL_TABLET | Freq: Once | ORAL | Status: AC
  Administered 2013-08-07: 10 mg via ORAL
  Filled 2013-08-07: qty 1

## 2013-08-07 NOTE — Progress Notes (Signed)
Occupational Therapy Treatment Patient Details Name: Travis Chapman MRN: [id number] DOB: May 15, 1945 Today's Date: 08/07/2013    History of present illness Pt adm with confusion and incr falls. Long term trach - on vent at night. Morbid obesity, DM, CHF, severe arthritis of knees   OT comments  Pt with improving activity tolerance and ability to assist with functional transfers.  Continue to recommend SNF  Follow Up Recommendations  SNF    Equipment Recommendations  None recommended by OT    Recommendations for Other Services      Precautions / Restrictions Precautions Precautions: Fall       Mobility Bed Mobility Overal bed mobility: Needs Assistance Bed Mobility: Supine to Sit     Supine to sit: Mod assist     General bed mobility comments: Assist to scoot LEs and hips to EOB.  Assist to lift shoulders  Transfers Overall transfer level: Needs assistance Equipment used: Rolling walker (2 wheeled) Transfers: Sit to/from [employer] Transfers Sit to Stand: +2 physical assistance;Min assist Stand pivot transfers: Min assist;+2 physical assistance       General transfer comment: Assist to move into standing.  Assist for balance    Balance Overall balance assessment: Needs assistance Sitting-balance support: Single extremity supported Sitting balance-Leahy Scale: Fair     Standing balance support: Bilateral upper extremity supported Standing balance-Leahy Scale: Poor                     ADL                       Lower Body Dressing: Maximal assistance;Sit to/from stand   Toilet Transfer: Minimal assistance;Stand-pivot;BSC   Toileting- Clothing Manipulation and Hygiene: Maximal assistance;Sit to/from stand       Functional mobility during ADLs: Minimal assistance;+2 for physical assistance;Rolling walker General ADL Comments: Pt moves slowly.  Requires min A +2 for toilet transfer due to balance and weakness      Vision                      Perception     Praxis      Cognition   Behavior During Therapy: [facility] for tasks assessed/performed Overall Cognitive Status: Within Functional Limits for tasks assessed                       Extremity/Trunk Assessment               Exercises     Shoulder Instructions       General Comments      Pertinent Vitals/ Pain       See vitals flow sheet  Home Living                                          Prior Functioning/Environment              Frequency Min 2X/week     Progress Toward Goals  OT Goals(current goals can now be found in the care plan section)  Progress towards OT goals: Progressing toward goals  ADL Goals Pt Will Perform Lower Body Bathing: with min assist;with adaptive equipment;sit to/from stand Pt Will Perform Lower Body Dressing: with min assist;with adaptive equipment;sit to/from stand Pt Will Transfer to Toilet: with min assist;ambulating;regular height toilet;bedside commode;grab bars Pt Will Perform Toileting -  Clothing Manipulation and hygiene: with min assist;sit to/from stand  Plan Discharge plan remains appropriate    Co-evaluation                 End of Session     Activity Tolerance Patient tolerated treatment well   Patient Left in chair;with call bell/phone within reach   Nurse Communication Mobility status        Time: 1201-1229 OT Time Calculation (min): 28 min  Charges: OT General Charges $OT Visit: 1 Procedure OT Treatments $Therapeutic Activity: 23-37 mins  Travis Chapman M 08/07/2013, 1:24 PM

## 2013-08-07 NOTE — Progress Notes (Signed)
Physical Therapy Treatment Patient Details Name: Travis Chapman MRN: [id number] DOB: November 23, 1945 Today's Date: 08/07/2013    History of Present Illness Pt adm with confusion and incr falls. PMHx-Long term trach - on vent at night. Morbid obesity, DM, CHF, severe arthritis of knees with frequent falls and 5 admissions since 2/15 for falls    PT Comments    Pt very limited by arthritic knee pain and knee buckling (and anxiety related to buckling/falls). Pilar Plate discussion re: his options (ie trying to maximize strength in legs, instruct in gait pattern that encourages knee extensor moment, limit locomotion to w/c vs scooter). Pt expressed understanding and agrees to work on exercise program given to him today by PT. Agree SNF is best alternative to maximize therapy he will receive (5 days/week) and help determine best course of action.   Follow Up Recommendations  SNF;Supervision for mobility/OOB     Equipment Recommendations  None recommended by PT    Recommendations for Other Services       Precautions / Restrictions Precautions Precautions: Fall Precaution Comments: numerous due to knees buckling without warning    Mobility  Bed Mobility Overal bed mobility: Needs Assistance Bed Mobility: Supine to Sit     Supine to sit: Mod assist     General bed mobility comments: up in recliner; did not want to return to bed; concerned nursing would not know how to "put his bed back together" (OT had deflated and removed raiil). Brought nurse tech in for education   Transfers Overall transfer level: Needs assistance Equipment used: Rolling walker (2 wheeled) Transfers: Sit to/from Stand Sit to Stand: +2 physical assistance;Min assist Stand pivot transfers: Min assist;+2 physical assistance       General transfer comment: vc for proper positioning prior to stand (to get to edge of chair with feet under him). Steady assist as transitioning his hands from chair to RW. Repeated x  3  Ambulation/Gait Ambulation/Gait assistance: Min assist;+2 physical assistance Ambulation Distance (Feet): 2 Feet Assistive device: Rolling walker (2 wheeled) (bariatric) Gait Pattern/deviations: Step-to pattern;Decreased stride length;Wide base of support     General Gait Details: one step up onto scale and then step back to chair (assisted nursing to weigh pt).    Stairs            Wheelchair Mobility    Modified Rankin (Stroke Patients Only)       Balance Overall balance assessment: Needs assistance Sitting-balance support: Single extremity supported Sitting balance-Leahy Scale: Good (edge of chair)     Standing balance support: Bilateral upper extremity supported Standing balance-Leahy Scale: Poor Standing balance comment: stood x 4 minutes for pericare; stood x 2 minutes; stood x 2 minutes                    Cognition Arousal/Alertness: Awake/alert Behavior During Therapy: WFL for tasks assessed/performed Overall Cognitive Status: Within Functional Limits for tasks assessed                      Exercises General Exercises - Lower Extremity Straight Leg Raises:  (instructed, pt could not perform in chair; to do in bed) Low Level/ICU Exercises Ankle Circles/Pumps: AROM;Both;10 reps;Seated (instructed to push toes down into floor when lifting heels) Quad Sets:  (instructed, pt could not perform in chair; to do in bed) Heel Slides:  (instructed, pt could not perform in chair; to do in bed) Stabilized Bridging:  (instructed, pt could not perform in chair; to do in  bed)    General Comments General comments (skin integrity, edema, etc.): Discussed ? need to learn to walk with knees extended and center of mass in front of his knees to create an extension moment at his knees. He does not think he can tolerate this extended position of his knees due to pain. Discussed need to strengthen legs to minimize buckling, however difficult with his arthritis.  Reviewed types of exercises that will put less strain on his knees. Discussed possible need for electric scooter and he reports he just recently got a manual w/c and states it does fit throughout his home.      Pertinent Vitals/Pain SaO2 100% on RA; even with pt intermittently finger occluding trach to speak    Home Living               Home Equipment: Walker - 2 wheels;Wheelchair - manual      Prior Function            PT Goals (current goals can now be found in the care plan section) Progress towards PT goals: Progressing toward goals (goal added)    Frequency  Min 3X/week    PT Plan Current plan remains appropriate    Co-evaluation             End of Session   Activity Tolerance: Patient limited by pain Patient left: in chair;with call bell/phone within reach;with nursing/sitter in room     Time: 1332-1407 PT Time Calculation (min): 35 min  Charges:  $Therapeutic Exercise: 8-22 mins $Therapeutic Activity: 8-22 mins                    G Codes:      Haylee Mcanany August 30, 2013, 2:42 PM Pager [phone]

## 2013-08-07 NOTE — Progress Notes (Signed)
Clinical Social Work Department CLINICAL SOCIAL WORK PLACEMENT NOTE 08/07/2013  Patient:  Travis Chapman, Travis Chapman  Account Number:  [id number] [place] date:  08/03/2013  Clinical Social Worker:  Hunt Oris, Latanya Presser  Date/time:  08/07/2013 04:06 PM  Clinical Social Work is seeking post-discharge placement for this patient at the following level of care:   SKILLED NURSING   (*CSW will update this form in Epic as items are completed)   08/07/2013  Patient/family provided with [place] Department of Clinical Social Work's list of facilities offering this level of care within the geographic area requested by the patient (or if unable, by the patient's family).  08/07/2013  Patient/family informed of their freedom to choose among providers that offer the needed level of care, that participate in Medicare, Medicaid or managed care program needed by the patient, have an available bed and are willing to accept the patient.  08/07/2013  Patient/family informed of [facility]' ownership interest in [facility], as well as of the fact that they are under no obligation to receive care at this facility.  PASARR submitted to EDS on 08/07/2013 PASARR number received on 08/07/2013  FL2 transmitted to all facilities in geographic area requested by pt/family on  08/07/2013 FL2 transmitted to all facilities within larger geographic area on 08/07/2013  Patient informed that his/her managed care company has contracts with or will negotiate with  certain facilities, including the following:     Patient/family informed of bed offers received:   Patient chooses bed at  Physician recommends and patient chooses bed at    Patient to be transferred to  on   Patient to be transferred to facility by  Patient and family notified of transfer on  Name of family member notified:    The following physician request were entered in Epic:   Additional Comments: Clinical Social Work Department CLINICAL SOCIAL WORK  PLACEMENT NOTE 08/07/2013  Patient:  Travis Chapman, Travis Chapman  Account Number:  [id number] [place] date:  08/03/2013  Clinical Social Worker:  Hunt Oris, Latanya Presser  Date/time:  08/07/2013 04:06 PM  Clinical Social Work is seeking post-discharge placement for this patient at the following level of care:   [place]   (*CSW will update this form in [place] as items are completed)   08/07/2013  Patient/family provided with [place] Department of Clinical Social Work's list of facilities offering this level of care within the geographic area requested by the patient (or if unable, by the patient's family).  08/07/2013  Patient/family informed of their freedom to choose among providers that offer the needed level of care, that participate in Medicare, Medicaid or managed care program needed by the patient, have an available bed and are willing to accept the patient.  08/07/2013  Patient/family informed of [facility]' ownership interest in [facility], as well as of the fact that they are under no obligation to receive care at this facility.  PASARR submitted to EDS on 08/07/2013 PASARR number received on 08/07/2013  FL2 transmitted to all facilities in geographic area requested by pt/family on  08/07/2013 FL2 transmitted to all facilities within larger geographic area on 08/07/2013  Patient informed that his/her managed care company has contracts with or will negotiate with  certain facilities, including the following:     Patient/family informed of bed offers received:   Patient chooses bed at  Physician recommends and patient chooses bed at    Patient to be transferred to  on   Patient to be transferred to  facility by  Patient and family notified of transfer on  Name of family member notified:    The following physician request were entered in Epic:   Additional Comments: Hunt Oris, MSW, [place]

## 2013-08-07 NOTE — Progress Notes (Addendum)
Clinical Social Work Department BRIEF PSYCHOSOCIAL ASSESSMENT 08/07/2013  Patient:  Travis Chapman, Travis Chapman     Account Number:  [id number]     Admit date:  08/03/2013  Clinical Social Worker:  Valda Lamb  Date/Time:  08/07/2013 03:49 PM  Referred by:  Physician  Date Referred:  08/07/2013 Referred for  SNF Placement   Other Referral:   Interview type:  Patient Other interview type:    PSYCHOSOCIAL DATA Living Status:  PARENTS Admitted from facility:   Level of care:   Primary support name:  Children - [name] and [place] Primary support relationship to patient:  CHILD, ADULT Degree of support available:   Pt lives with mother who is 67+ years old. Pt states he has no choice but to go to a facility as he needs assistance at discharge.    CURRENT CONCERNS Current Concerns  Post-Acute Placement   Other Concerns:    SOCIAL WORK ASSESSMENT / PLAN Covering CSW informed that pt is medically stable for discharge today.    CSW reviewed notes and observed that pt is on a trach and on a vent at night. CSW also informed by other staff that pt has refused placement in the past, including [facility].    CSW visited pt room and explored pt wishes not to return home. Pt confirmed that he "has no choice but I need to go to a facility." Pt inquired about placement locally however CSW informed pt about the difficult in doing this. CSW did inform pt that CSW would place a referral to [facility], however inquired whether pt would be agreeable to out of state as most facilities would be out of state. Pt stated that yes he would be agreeable as he lives with his 67 year old mother.    Pt also made aware that he will need to apply for Medicaid, pt stated that his daughter informed him that he has Medicaid already. Pt gave CSW consent to speak to his daughters.    CSW to send referral to several vent facilities and follow up with offers.   Assessment/plan status:  Psychosocial Support/Ongoing Assessment of  Needs Other assessment/ plan:   Information/referral to community resources:   CSW provided pt with CSW contact number. CSW has provided pt with education regarding Vent placement.    PATIENT'S/FAMILY'S RESPONSE TO PLAN OF CARE: Pt is alert and oriented and agreeable to placement locally and out of state. Pt receptive and aware of challenges regarding vent placement.    Unit CSW to remain following for placement.       Hunt Oris, MSW, [place]

## 2013-08-07 NOTE — Progress Notes (Signed)
[place] Patient Details Name: Lestat Golob MRN: [id number] DOB: [birth year] Today's Date: 08/07/2013 Time:  -      SLP reviewed chart as part of trach team.  Per notes, pt. Is conveying wishes/thoughts with staff (head nods?, gestures or mouthing?) and appears appropriate for [place] speaking valve.  MD please order if desired.    Orbie Pyo Ches[place].Ed [employer] [phone]  08/07/2013

## 2013-08-07 NOTE — Progress Notes (Signed)
[place] TEAM 1 - Stepdown/ICU TEAM Progress Note  Emmanual Gauthreaux ZOX:[id number] DOB: 1945-03-14 DOA: 08/03/2013 PCP: Renato Shin, MD  Admit HPI / Brief Narrative: 68 y.o. 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 Patient was seen in the emergency room 2 days prior for similar complaints and his tracheostomy was found to have a mucous plug which was cleared. Patient went home on oral antibiotics. He has had several admissions during the past year and has refused skilled nursing when previously offered  In the emergency room the patient was noted to have mild acute respiratory failure with a PCO2 in the 60s as well as an INR of 4.3. Family indicated that they would like for patient to be placed in a SNF.   HPI/Subjective: 7/23 patient A./O. x4, NAD, sitting in chair comfortably, patient was accused person assist. Reaffirms ready for SNF    Assessment/Plan: Altered mental status  -Resolved at this time  - W11 : High  -folic acid; Normal   CKD stage III  -baseline creatinine 1.3-2 - renal function currently stable  -7/23 creatinine= 1.48  Acute on chronic diastolic heart failure  -EF 50-55% via TTE April 2015, w/ grade 1 DD  - negative 3L since admit already  - Admission weight 167kg, 7/21 weight bed= 160.6 kg which appears to be his baseline since June of this year; obtain today's weights.   Morbid obesity - Body mass index is 43.52 kg/(m^2).   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  -CBG reasonably controlled at present  -7/21 hemoglobin A1c = 6.8  -Lipid panel; within ADA guidelines  HTN - uncontrolled  -Continue amlodipine 10 mg daily  -Continue Coreg 6.25 mg  BID -Continue clonidine 0.1 mg  BID -Continue Lasix 20 mg  BID -Continue losartan 100 mg daily   HLD  -Continue Zocor 20 mg daily   Severe B knee pain  -Most c/w osteoarthritis due to moribid obesity  - PT/OT; recommend SNF  - pain control  - no exam findings to suggest effusion  - had 4 view xrays of B knees in March c/w severe osteoarthritis   Code Status: FULL  Family Communication: no family present at time of exam  Disposition Plan: Stable for discharge to SNF when facility located which will accept patient       Consultants: NA  Procedure/Significant Events: 7/19 PCXR;Vascular congestion and mild cardiomegaly. Mild bilateral atelectasis    Culture NA  Antibiotics: NA  DVT prophylaxis: Coumadin   Devices Chronic trach   LINES / TUBES:  7/19 20ga right hand    Continuous Infusions:   Objective: VITAL SIGNS: Temp: 98.4 F (36.9 C) (07/23 1116) Temp src: Oral (07/23 1116) BP: 138/63 mmHg (07/23 1230) Pulse Rate: 82 (07/23 1230) SPO2; 100% on room air  FIO2: 28%   Intake/Output Summary (Last 24 hours) at 08/07/13 1255 Last data filed at 08/07/13 1117  Gross per 24 hour  Intake    600 ml  Output   2951 ml  Net  -2351 ml     Exam: General: A./O. x4, sitting comfortably in chair off of the vent, No acute respiratory distress Lungs: Clear to auscultation bilaterally without wheezes or crackles Cardiovascular: Regular rate and rhythm without murmur gallop or rub normal S1 and S2 Abdomen: Morbidly obese, Nontender, nondistended, soft, bowel sounds positive, no rebound, no ascites, no appreciable mass Extremities: No significant cyanosis, clubbing, or  edema bilateral lower extremities  Data Reviewed: Basic Metabolic Panel:  Recent Labs Lab 08/01/13 0154 08/01/13 0452 08/03/13 0444 08/04/13 0327 08/05/13 0324 08/07/13 0238  NA 139 142 140 142 136* 134*  K 5.8* 5.1 4.3 4.4 4.1 4.1  CL 102 103 100 99 97 96  CO2 26 30 24 28 27 25   GLUCOSE 148* 156* 146* 152* 145* 173*  BUN 29* 29* 25* 18 20 24*  CREATININE 1.88* 1.89* 1.53* 1.34 1.47* 1.48*  CALCIUM 9.1 8.9 9.0 9.2 8.8 8.6  MG  --  2.2  --   --   --   --   PHOS  --  2.7  --   --   --   --    Liver Function Tests: No results found for this basename: AST, ALT, ALKPHOS, BILITOT, PROT, ALBUMIN,  in the last 168 hours No results found for this basename: LIPASE, AMYLASE,  in the last 168 hours No results found for this basename: AMMONIA,  in the last 168 hours CBC:  Recent Labs Lab 08/01/13 0154 08/01/13 0452 08/03/13 0444 08/05/13 0324  WBC 6.2 6.3 5.6 5.1  NEUTROABS 4.7  --   --   --   HGB 12.3* 11.7* 12.2* 12.0*  HCT 39.4 37.1* 38.5* 36.6*  MCV 90.2 89.0 88.9 84.3  PLT 150 168 130* 186   Cardiac Enzymes:  Recent Labs Lab 08/01/13 0154  TROPONINI <0.30   BNP (last 3 results)  Recent Labs  03/10/13 1630 08/01/13 0154 08/03/13 1600  PROBNP 58.0 265.8* 844.9*   CBG:  Recent Labs Lab 08/04/13 2102 08/05/13 0731 08/05/13 1147 08/07/13 0847 08/07/13 1136  GLUCAP 210* 149* 168* 147* 194*    Recent Results (from the past 240 hour(s))  MRSA PCR SCREENING     Status: None   Collection Time    08/03/13  3:03 PM      Result Value Ref Range Status   MRSA by PCR NEGATIVE  NEGATIVE Final   Comment:            The GeneXpert MRSA Assay (FDA     approved for NASAL specimens     only), is one component of a     comprehensive MRSA colonization     surveillance program. It is not     intended to diagnose MRSA     infection nor to guide or     monitor treatment for     MRSA infections.     Studies:  Recent x-ray studies have been reviewed in detail by  the Attending Physician  Scheduled Meds:  Scheduled Meds: . alprazolam  2 mg Oral QHS  . amLODipine  10 mg Oral Daily  . carvedilol  6.25 mg Oral BID WC  . citalopram  20 mg Oral Daily  . cloNIDine  0.1 mg Oral BID  . furosemide  20 mg Intravenous Daily  . insulin glargine  20 Units Subcutaneous q morning - 10a  . losartan  100 mg Oral Daily  .  metoCLOPramide  5 mg Oral TID  . potassium chloride SA  20 mEq Oral Daily  . simvastatin  20 mg Oral q1800  . Warfarin - Pharmacist Dosing Inpatient   Does not apply q1800    Time spent on care of this patient: 40 mins   Allie Bossier , MD   [facility] Office  [phone] Pager [phone]  On-Call/Text Page:      [email or web address]      password [email or web address]  If 7PM-7AM, please contact night-coverage [email or web address] Password [email or web address] 08/07/2013, 12:55 PM   LOS: 4 days

## 2013-08-07 NOTE — Progress Notes (Signed)
ANTICOAGULATION CONSULT NOTE - Follow Up Consult  Pharmacy Consult for Coumadin Indication: hx recurrent PE  No Known Allergies  Patient Measurements: Height: 6' 5.17" (196 cm) Weight: 354 lb 0.9 oz (160.6 kg) IBW/kg (Calculated) : 89.48  Vital Signs: Temp: 98.1 F (36.7 C) (07/23 0816) Temp src: Oral (07/23 0816) BP: 148/71 mmHg (07/23 0816) Pulse Rate: 77 (07/23 0928)  Labs:  Recent Labs  08/05/13 0324 08/05/13 0500 08/06/13 0305 08/07/13 0238  HGB 12.0*  --   --   --   HCT 36.6*  --   --   --   PLT 186  --   --   --   LABPROT  --  29.7* 22.0* 21.6*  INR  --  2.82* 1.92* 1.88*  CREATININE 1.47*  --   --  1.48*    Estimated Creatinine Clearance: 80.8 ml/min (by C-G formula based on Cr of 1.48).  Assessment: 33 yom admitted for increased confusion/frequent falls. He continues on coumadin for hx recurrent PE. INR remains low at 1.88 likely related to held doses. Initially INR was supratherapeutic, potentially related to drug interaction with doxycycline PTA. No new CBC today, no bleeding noted.   Goal of Therapy:  INR 2-3   Plan:  1. Coumadin 10mg  PO x 1 today - will give at noon to better assess effect on AM INR 2. F/u AM INR 3. May need to consider bridging with heparin until INR is therapeutic  Salome Arnt, PharmD, BCPS Pager # [phone] 08/07/2013 9:59 AM

## 2013-08-07 NOTE — Trach Care Team (Signed)
Trach Care Progression Note   Patient Details Name: Travis Chapman MRN: [id number] DOB: [birth year] Today's Date: 08/07/2013   Tracheostomy Assessment    Tracheostomy Shiley 7 mm Distal (Active)  Status Secured 08/07/2013 12:30 PM  Site Assessment Clean;Dry 08/07/2013 12:30 PM  Site Care Cleansed;Dried 08/07/2013  9:30 AM  Inner Cannula Care Other (Comment) 08/06/2013  4:00 PM  Ties Assessment Clean;Dry;Secure 08/07/2013 12:30 PM  Cuff pressure (cm) 0 cm 08/07/2013 12:30 PM  Trach Changed Yes 08/05/2013  5:30 AM  Emergency Equipment at bedside Yes 08/07/2013 12:30 PM     Care Needs     Respiratory Therapy O2 Device: None (Room air) FiO2 (%): 30 % SpO2: 100 %    Speech Language Pathology  SLP chart review complete: Patient ready for SLP services;Request order for PMSV evaluation   Physical Therapy Ambulation/Gait assistance: +2 physical assistance;Min assist PT Recommendation/Assessment: Patient needs continued PT services Follow Up Recommendations: SNF PT equipment: None recommended by PT    Occupational Therapy OT Recommendation/Assessment: Patient needs continued OT Services Follow Up Recommendations: SNF OT Equipment: None recommended by OT    Nutritional Patient's Current Diet: Cardiac;Carb modified    Case Management/Social Work  No order yet    Recommendations  Pt needs a social work consult and SLP Eval and Treat order for [place] Team Members -  Herbie Baltimore, SLP, Elana Alm, SW, [place], RT   Aunika Kirsten, Katherene Ponto 08/07/2013, 2:21 PM

## 2013-08-08 LAB — GLUCOSE, CAPILLARY
GLUCOSE-CAPILLARY: 200 mg/dL — AB (ref 70–99)
Glucose-Capillary: 215 mg/dL — ABNORMAL HIGH (ref 70–99)
Glucose-Capillary: 169 mg/dL — ABNORMAL HIGH (ref 70–99)
Glucose-Capillary: 171 mg/dL — ABNORMAL HIGH (ref 70–99)

## 2013-08-08 LAB — PROTIME-INR
INR: 2.13 — ABNORMAL HIGH (ref 0.00–1.49)
Prothrombin Time: 23.8 seconds — ABNORMAL HIGH (ref 11.6–15.2)

## 2013-08-08 LAB — FOLATE: FOLATE: 14.6 ng/mL

## 2013-08-08 MED ORDER — INSULIN ASPART 100 UNIT/ML ~~LOC~~ SOLN: 0.0000 [IU] | Freq: Every day | SUBCUTANEOUS | Status: DC

## 2013-08-08 MED ORDER — WARFARIN SODIUM 7.5 MG PO TABS
7.5000 mg | ORAL_TABLET | Freq: Once | ORAL | Status: AC
  Administered 2013-08-08: 7.5 mg via ORAL
  Filled 2013-08-08: qty 1

## 2013-08-08 MED ORDER — INSULIN ASPART 100 UNIT/ML ~~LOC~~ SOLN
0.0000 [IU] | Freq: Three times a day (TID) | SUBCUTANEOUS | Status: DC
  Administered 2013-08-09 – 2013-08-11 (×8): 2 [IU] via SUBCUTANEOUS
  Administered 2013-08-12: 1 [IU] via SUBCUTANEOUS

## 2013-08-08 MED ORDER — LACTULOSE 10 GM/15ML PO SOLN
30.0000 g | Freq: Two times a day (BID) | ORAL | Status: DC | PRN
  Filled 2013-08-08: qty 45

## 2013-08-08 MED ORDER — POLYETHYLENE GLYCOL 3350 17 G PO PACK
17.0000 g | PACK | Freq: Every day | ORAL | Status: DC
  Administered 2013-08-09: 17 g via ORAL
  Filled 2013-08-08 (×5): qty 1

## 2013-08-08 MED ORDER — SENNOSIDES-DOCUSATE SODIUM 8.6-50 MG PO TABS
1.0000 | ORAL_TABLET | Freq: Two times a day (BID) | ORAL | Status: DC
  Administered 2013-08-08 – 2013-08-12 (×7): 1 via ORAL
  Filled 2013-08-08 (×9): qty 1

## 2013-08-08 MED ORDER — ACETAMINOPHEN 500 MG PO TABS
1000.0000 mg | ORAL_TABLET | Freq: Four times a day (QID) | ORAL | Status: DC | PRN
  Administered 2013-08-08 – 2013-08-11 (×7): 1000 mg via ORAL
  Filled 2013-08-08 (×7): qty 2

## 2013-08-08 NOTE — Progress Notes (Signed)
Spoke with pt and his family re: bed search process.  No bed offers at this time, but CSW continues to explore various options.  Per daughter, she applied for Medicaid for pt on Monday, 7/20.  CSW will continue to follow.

## 2013-08-08 NOTE — Progress Notes (Signed)
ANTICOAGULATION CONSULT NOTE - Follow Up Consult  Pharmacy Consult for Coumadin Indication: hx recurrent PE  No Known Allergies  Patient Measurements: Height: 6' 5.17" (196 cm) Weight: 342 lb (155.13 kg) IBW/kg (Calculated) : 89.48  Vital Signs: Temp: 99.4 F (37.4 C) (07/24 0744) Temp src: Oral (07/24 0744) BP: 137/78 mmHg (07/24 0941) Pulse Rate: 70 (07/24 1203)  Labs:  Recent Labs  08/06/13 0305 08/07/13 0238 08/08/13 0327  LABPROT 22.0* 21.6* 23.8*  INR 1.92* 1.88* 2.13*  CREATININE  --  1.48*  --     Estimated Creatinine Clearance: 79.3 ml/min (by C-G formula based on Cr of 1.48).  Assessment: 54 yom admitted for increased confusion/frequent falls. He continues on coumadin for hx recurrent PE. INR now up to 2.13 (INR noted 4.52 on admit, potentially related to drug interaction with doxycycline PTA).   Home coumadin dose: 7.5mg /day   Goal of Therapy:  INR 2-3   Plan:  -Coumadin 7.5mg  today -Daily PT/INR  Hildred Laser, Pharm D 08/08/2013 12:07 PM

## 2013-08-08 NOTE — Progress Notes (Signed)
Inpatient Diabetes Program Recommendations  AACE/ADA: New Consensus Statement on Inpatient Glycemic Control (2013)  Target Ranges:  Prepandial:   less than 140 mg/dL      Peak postprandial:   less than 180 mg/dL (1-2 hours)      Critically ill patients:  140 - 180 mg/dL   Results for ABELARDO, SEIDNER (MRN [id number]) as of 08/08/2013 12:14  Ref. Range 08/07/2013 08:47 08/07/2013 11:36 08/07/2013 17:08 08/07/2013 20:11 08/08/2013 09:48  Glucose-Capillary Latest Range: 70-99 mg/dL 147 (H) 194 (H) 164 (H) 209 (H) 215 (H)   Diabetes history: DM2  Outpatient Diabetes medications: Lantus 20 units QAM  Current orders for Inpatient glycemic control: Lantus 20 units QAM   Inpatient Diabetes Program Recommendations  Correction (SSI): Over the past 24 hours CBGs have ranged from 147-215 mg/dl. Patient would benefit from Novolog correction to improve inpatient glycemic control. While inpatient please consider ordering Novolog sensitive correction ACHS.   Thanks,  Barnie Alderman, RN, MSN, CCRN  Diabetes Coordinator  Inpatient Diabetes Program  [phone] (Team Pager)  [phone] (AP office)  [phone] [place] [facility] office)

## 2013-08-08 NOTE — Progress Notes (Signed)
Speech Language Pathology Discharge Patient Details Name: Travis Chapman MRN: [id number] DOB: October 29, 1945 Today's Date: 08/08/2013 Time: 7425-     Patient discharged from SLP services secondary to Pt. politely declined PMSV eval.  SLP educated on PMSV benefits, however he does not desire to use valve.  Please see latest therapy progress note for current level of functioning and progress toward goals.    Progress and discharge plan discussed with patient and/or caregiver: Patient/Caregiver agrees with plan    GO    Cranford Mon.Ed CCC-SLP Pager [phone]   08/08/2013, 9:29 AM

## 2013-08-08 NOTE — Progress Notes (Signed)
[place] TEAM 1 - Stepdown/ICU TEAM Progress Note  Travis Chapman IEP:[id number] DOB: 26-Jan-1945 DOA: 08/03/2013 PCP: Renato Shin, MD  Admit HPI / Brief Narrative: 68 y.o. 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 Patient was seen in the emergency room 2 days prior for similar complaints and his tracheostomy was found to have a mucous plug which was cleared. Patient went home on oral antibiotics. He has had several admissions during the past year and has refused skilled nursing when previously offered.  In the emergency room the patient was noted to have mild acute respiratory failure with a PCO2 in the 60s as well as an INR of 4.3. Family indicated that they would like for patient to be placed in a SNF.  HPI/Subjective: No new complaints today.  Awaiting SNF bed.  Does ask for tylenol for pain and a med for constipation.    Assessment/Plan:  Altered mental status  Resolved at this time - follow - Y60 high - folic acid normal   CKD stage III baseline creatinine 1.3-2 - renal function currently stable   Acute on chronic diastolic heart failure EF 50-55% via TTE April 2015, w/ grade 1 DD - negative 6.6L since admit - weight 155kg which appears to be well below his baseline since June of this year of ~167  Morbid obesity - Body mass index is 40.38 kg/(m^2).  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  CBG climbing - add SSI and follow   HTN - uncontrolled    cont current regimen w/o change    HLD Cont home medical tx  Severe B knee pain Most c/w osteoarthritis due to moribid obesity - PT/OT - pain control - no exam findings to suggest effusion - had 4 view xrays of B knees in March c/w severe osteoarthritis   Code Status: FULL Family Communication: spoke w/ daughter at bedside  Disposition Plan: medically stable for SNF - awaiting location of a bed for transfer   Consultants: PCCM - vent management only   Procedures: none  Antibiotics: none  DVT prophylaxis: Warfarin   Objective: Blood pressure 137/78, pulse 70, temperature 98.1 F (36.7 C), temperature source Oral, resp. rate 20, height 6' 5.17" (1.96 m), weight 155.13 kg (342 lb), SpO2 95.00%.  Intake/Output Summary (Last 24 hours) at 08/08/13 1458 Last data filed at 08/08/13 1100  Gross per 24 hour  Intake    360 ml  Output    875 ml  Net   -515 ml   Exam: General: No acute respiratory distress - alert and conversant  Lungs: very distant breath sounds - no wheezes or crackles  Cardiovascular: very distant heart sounds - regular rate and rythmn  Abdomen: morbidly obese - nontender, soft, bowel sounds positive, no rebound Extremities: No significant cyanosis, clubbing; trace edema bilateral lower extremities w/ change c/w chronic venous stasis dermatitis   Data Reviewed: Basic Metabolic Panel:  Recent Labs Lab 08/03/13 0444 08/04/13 0327 08/05/13 0324 08/07/13 0238  NA 140 142 136* 134*  K  4.3 4.4 4.1 4.1  CL 100 99 97 96  CO2 24 28 27 25   GLUCOSE 146* 152* 145* 173*  BUN 25* 18 20 24*  CREATININE 1.53* 1.34 1.47* 1.48*  CALCIUM 9.0 9.2 8.8 8.6    Liver Function Tests: No results found for this basename: AST, ALT, ALKPHOS, BILITOT, PROT, ALBUMIN,  in the last 168 hours  Coags:  Recent Labs Lab 08/04/13 0327 08/05/13 0500 08/06/13 0305 08/07/13 0238 08/08/13 0327  INR 4.01* 2.82* 1.92* 1.88* 2.13*   No results found for this basename: PTT,  in  the last 168 hours  CBC:  Recent Labs Lab 08/03/13 0444 08/05/13 0324  WBC 5.6 5.1  HGB 12.2* 12.0*  HCT 38.5* 36.6*  MCV 88.9 84.3  PLT 130* 186   CBG:  Recent Labs Lab 08/07/13 1136 08/07/13 1708 08/07/13 2011 08/08/13 0948 08/08/13 1139  GLUCAP 194* 164* 209* 215* 200*    Recent Results (from the past 240 hour(s))  MRSA PCR SCREENING     Status: None   Collection Time    08/03/13  3:03 PM      Result Value Ref Range Status   MRSA by PCR NEGATIVE  NEGATIVE Final   Comment:            The GeneXpert MRSA Assay (FDA     approved for NASAL specimens     only), is one component of a     comprehensive MRSA colonization     surveillance program. It is not     intended to diagnose MRSA     infection nor to guide or     monitor treatment for     MRSA infections.    Studies:  Recent x-ray studies have been reviewed in detail by the Attending Physician  Scheduled Meds:  Scheduled Meds: . alprazolam  2 mg Oral QHS  . amLODipine  10 mg Oral Daily  . carvedilol  6.25 mg Oral BID WC  . citalopram  20 mg Oral Daily  . cloNIDine  0.1 mg Oral BID  . furosemide  20 mg Intravenous Daily  . insulin glargine  20 Units Subcutaneous q morning - 10a  . losartan  100 mg Oral Daily  . metoCLOPramide  5 mg Oral TID  . potassium chloride SA  20 mEq Oral Daily  . simvastatin  20 mg Oral q1800  . warfarin  7.5 mg Oral ONCE-1800  . Warfarin - Pharmacist Dosing Inpatient   Does not apply q1800    Time spent on care of this patient: 25 mins   [facility] T , MD   [facility] Office  [phone] Pager - Text Page per Amion as per below:  On-Call/Text Page:      [email or web address]      password [email or web address]  If 7PM-7AM, please contact night-coverage [email or web address] Password [email or web address] 08/08/2013, 2:58 PM   LOS: 5 days

## 2013-08-09 LAB — PROTIME-INR
INR: 2.1 — AB (ref 0.00–1.49)
PROTHROMBIN TIME: 23.6 s — AB (ref 11.6–15.2)

## 2013-08-09 LAB — GLUCOSE, CAPILLARY
GLUCOSE-CAPILLARY: 172 mg/dL — AB (ref 70–99)
Glucose-Capillary: 156 mg/dL — ABNORMAL HIGH (ref 70–99)
Glucose-Capillary: 162 mg/dL — ABNORMAL HIGH (ref 70–99)
Glucose-Capillary: 184 mg/dL — ABNORMAL HIGH (ref 70–99)

## 2013-08-09 MED ORDER — FUROSEMIDE 40 MG PO TABS
40.0000 mg | ORAL_TABLET | Freq: Every day | ORAL | Status: DC
  Administered 2013-08-10: 40 mg via ORAL
  Filled 2013-08-09 (×2): qty 1

## 2013-08-09 MED ORDER — WARFARIN SODIUM 7.5 MG PO TABS
7.5000 mg | ORAL_TABLET | Freq: Once | ORAL | Status: AC
  Administered 2013-08-09: 7.5 mg via ORAL
  Filled 2013-08-09: qty 1

## 2013-08-09 NOTE — Progress Notes (Signed)
[place] TEAM 1 - Stepdown/ICU TEAM Progress Note  Travis Chapman NWG:[id number] DOB: 1946-01-16 DOA: 08/03/2013 PCP: Renato Shin, MD  Admit HPI / Brief Narrative: 68 y.o. 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 Patient was seen in the ED 2 days prior for similar complaints and his tracheostomy was found to have a mucous plug. Patient went home on oral antibiotics. He has had several admissions during the past year and has refused skilled nursing when previously offered.  In the emergency room the patient was noted to have mild acute respiratory failure with a PCO2 in the 60s as well as an INR of 4.3. Family indicated that they would like for patient to be placed in a SNF.  HPI/Subjective: No new complaints today.  Awaiting SNF bed.  Feels that he is going to get his bowels moving this morning.    Assessment/Plan:  Altered mental status  Resolved at this time - follow - V78 high - folic acid normal   CKD stage III baseline creatinine 1.3-2 - renal function currently stable   Acute on chronic diastolic heart failure EF 50-55% via TTE April 2015, w/ grade 1 DD - negative 7L since admit - weight 156kg which appears to be well below his baseline since June of this year of ~167  Morbid obesity - Body mass index is 40.62 kg/(m^2).  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  CBG improving - follow w/o change today   HTN - uncontrolled  cont current regimen  w/o change    HLD Cont home medical tx  Severe B knee pain Most c/w osteoarthritis due to moribid obesity - PT/OT - pain control - no exam findings to suggest effusion - had 4 view xrays of B knees in March c/w severe osteoarthritis   Code Status: FULL Family Communication: no family present at time of exam today   Disposition Plan: medically stable for SNF - awaiting location of a bed for transfer - SDU only as pt is on nocturnal vent via trach   Consultants: PCCM - vent management only   Procedures: none  Antibiotics: none  DVT prophylaxis: Warfarin   Objective: Blood pressure 131/76, pulse 69, temperature 98.4 F (36.9 C), temperature source Axillary, resp. rate 21, height 6' 5.17" (1.96 m), weight 156.037 kg (344 lb), SpO2 93.00%.  Intake/Output Summary (Last 24 hours) at 08/09/13 1038 Last data filed at 08/09/13 0748  Gross per 24 hour  Intake    360 ml  Output   1025 ml  Net   -665 ml   Exam: General: No acute respiratory distress - alert and conversant  Lungs: no wheezes or crackles  Cardiovascular: regular rate and rythmn  Extremities: No significant cyanosis, clubbing; trace edema bilateral lower extremities w/ change c/w chronic venous stasis dermatitis   Data Reviewed: Basic Metabolic Panel:  Recent Labs Lab 08/03/13 0444 08/04/13 0327 08/05/13 0324 08/07/13 0238  NA 140 142 136* 134*  K 4.3 4.4 4.1 4.1  CL 100 99 97  96  CO2 24 28 27 25   GLUCOSE 146* 152* 145* 173*  BUN 25* 18 20 24*  CREATININE 1.53* 1.34 1.47* 1.48*  CALCIUM 9.0 9.2 8.8 8.6    Liver Function Tests: No results found for this basename: AST, ALT, ALKPHOS, BILITOT, PROT, ALBUMIN,  in the last 168 hours  Coags:  Recent Labs Lab 08/05/13 0500 08/06/13 0305 08/07/13 0238 08/08/13 0327 08/09/13 0249  INR 2.82* 1.92* 1.88* 2.13* 2.10*   No results found for this basename: PTT,  in the last 168 hours  CBC:  Recent Labs Lab 08/03/13 0444 08/05/13 0324  WBC 5.6 5.1    HGB 12.2* 12.0*  HCT 38.5* 36.6*  MCV 88.9 84.3  PLT 130* 186   CBG:  Recent Labs Lab 08/08/13 0948 08/08/13 1139 08/08/13 1814 08/08/13 2130 08/09/13 0748  GLUCAP 215* 200* 169* 171* 156*    Recent Results (from the past 240 hour(s))  MRSA PCR SCREENING     Status: None   Collection Time    08/03/13  3:03 PM      Result Value Ref Range Status   MRSA by PCR NEGATIVE  NEGATIVE Final   Comment:            The GeneXpert MRSA Assay (FDA     approved for NASAL specimens     only), is one component of a     comprehensive MRSA colonization     surveillance program. It is not     intended to diagnose MRSA     infection nor to guide or     monitor treatment for     MRSA infections.    Studies:  Recent x-ray studies have been reviewed in detail by the Attending Physician  Scheduled Meds:  Scheduled Meds: . alprazolam  2 mg Oral QHS  . amLODipine  10 mg Oral Daily  . carvedilol  6.25 mg Oral BID WC  . citalopram  20 mg Oral Daily  . cloNIDine  0.1 mg Oral BID  . furosemide  20 mg Intravenous Daily  . insulin aspart  0-5 Units Subcutaneous QHS  . insulin aspart  0-9 Units Subcutaneous TID WC  . insulin glargine  20 Units Subcutaneous q morning - 10a  . losartan  100 mg Oral Daily  . metoCLOPramide  5 mg Oral TID  . polyethylene glycol  17 g Oral Daily  . potassium chloride SA  20 mEq Oral Daily  . senna-docusate  1 tablet Oral BID  . simvastatin  20 mg Oral q1800  . warfarin  7.5 mg Oral ONCE-1800  . Warfarin - Pharmacist Dosing Inpatient   Does not apply q1800    Time spent on care of this patient: 25 mins   [facility] T , MD   [facility] Office  [phone] Pager - Text Page per Amion as per below:  On-Call/Text Page:      [email or web address]      password [email or web address]  If 7PM-7AM, please contact night-coverage [email or web address] Password [facility] 08/09/2013, 10:38 AM   LOS: 6 days

## 2013-08-09 NOTE — Progress Notes (Signed)
ANTICOAGULATION CONSULT NOTE - Follow Up Consult  Pharmacy Consult for Coumadin Indication: hx recurrent PE  No Known Allergies  Patient Measurements: Height: 6' 5.17" (196 cm) Weight: 344 lb (156.037 kg) IBW/kg (Calculated) : 89.48  Vital Signs: Temp: 98.3 F (36.8 C) (07/25 0412) Temp src: Axillary (07/25 0412) BP: 143/89 mmHg (07/25 0600) Pulse Rate: 63 (07/25 0600)  Labs:  Recent Labs  08/07/13 0238 08/08/13 0327 08/09/13 0249  LABPROT 21.6* 23.8* 23.6*  INR 1.88* 2.13* 2.10*  CREATININE 1.48*  --   --     Estimated Creatinine Clearance: 79.5 ml/min (by C-G formula based on Cr of 1.48).  Assessment: 42 yom admitted for increased confusion/frequent falls. He continues on coumadin for hx recurrent PE. INR 2.10  (INR noted 4.52 on admit, potentially related to drug interaction with doxycycline PTA).   Home coumadin dose: 7.5mg /day   Goal of Therapy:  INR 2-3   Plan:  -Coumadin 7.5mg  today -Daily PT/INR  Hildred Laser, Pharm D 08/09/2013 7:15 AM

## 2013-08-10 LAB — BASIC METABOLIC PANEL
ANION GAP: 13 (ref 5–15)
BUN: 40 mg/dL — ABNORMAL HIGH (ref 6–23)
CALCIUM: 8.6 mg/dL (ref 8.4–10.5)
CO2: 23 mEq/L (ref 19–32)
Chloride: 97 mEq/L (ref 96–112)
Creatinine, Ser: 1.79 mg/dL — ABNORMAL HIGH (ref 0.50–1.35)
GFR calc Af Amer: 43 mL/min — ABNORMAL LOW (ref >90)
GFR calc non Af Amer: 38 mL/min — ABNORMAL LOW (ref >90)
GLUCOSE: 224 mg/dL — AB (ref 70–99)
Potassium: 3.9 mEq/L (ref 3.7–5.3)
SODIUM: 133 meq/L — AB (ref 137–147)

## 2013-08-10 LAB — GLUCOSE, CAPILLARY
GLUCOSE-CAPILLARY: 168 mg/dL — AB (ref 70–99)
Glucose-Capillary: 160 mg/dL — ABNORMAL HIGH (ref 70–99)
Glucose-Capillary: 174 mg/dL — ABNORMAL HIGH (ref 70–99)
Glucose-Capillary: 176 mg/dL — ABNORMAL HIGH (ref 70–99)

## 2013-08-10 LAB — PROTIME-INR
INR: 2.15 — ABNORMAL HIGH (ref 0.00–1.49)
PROTHROMBIN TIME: 24 s — AB (ref 11.6–15.2)

## 2013-08-10 MED ORDER — WARFARIN SODIUM 7.5 MG PO TABS
7.5000 mg | ORAL_TABLET | Freq: Every day | ORAL | Status: DC
  Administered 2013-08-10: 7.5 mg via ORAL
  Filled 2013-08-10 (×3): qty 1

## 2013-08-10 NOTE — Progress Notes (Signed)
ANTICOAGULATION CONSULT NOTE - Follow Up Consult  Pharmacy Consult for Coumadin Indication: hx recurrent PE  No Known Allergies  Patient Measurements: Height: 6' 5.17" (196 cm) Weight: 365 lb (165.563 kg) IBW/kg (Calculated) : 89.48  Vital Signs: Temp: 98 F (36.7 C) (07/26 0401) Temp src: Axillary (07/26 0401) BP: 106/73 mmHg (07/26 0401) Pulse Rate: 64 (07/26 0420)  Labs:  Recent Labs  08/08/13 0327 08/09/13 0249 08/10/13 0341  LABPROT 23.8* 23.6* 24.0*  INR 2.13* 2.10* 2.15*  CREATININE  --   --  1.79*    Estimated Creatinine Clearance: 67.9 ml/min (by C-G formula based on Cr of 1.79).  Assessment: 70 yom admitted for increased confusion/frequent falls. He continues on coumadin for hx recurrent PE. INR 2.15 and at goal (INR noted 4.52 on admit, potentially related to drug interaction with doxycycline PTA).   Home coumadin dose: 7.5mg /day   Goal of Therapy:  INR 2-3   Plan:  -Continue Coumadin 7.5mg /day  -Daily PT/INR  Hildred Laser, Pharm D 08/10/2013 7:24 AM

## 2013-08-10 NOTE — Progress Notes (Signed)
Rn called and stated patient was ready to come off ventilator.  Was placed on 28% ATC.  Vitals currently stable.  RT will continue to monitor.

## 2013-08-10 NOTE — Progress Notes (Signed)
[place] TEAM 1 - Stepdown/ICU TEAM Progress Note  Travis Chapman FKC:[id number] DOB: Jun 15, 1945 DOA: 08/03/2013 PCP: Renato Shin, MD  Admit HPI / Brief Narrative: 68 y.o. 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 Patient was seen in the ED 2 days prior for similar complaints and his tracheostomy was found to have a mucous plug. Patient went home on oral antibiotics. He has had several admissions during the past year and has refused skilled nursing when previously offered.  In the emergency room the patient was noted to have mild acute respiratory failure with a PCO2 in the 60s as well as an INR of 4.3. Family indicated that they would like for patient to be placed in a SNF.  HPI/Subjective: No new complaints today.  Awaiting SNF bed.      Assessment/Plan:  Altered mental status  Resolved at this time - follow - V49 high - folic acid normal   CKD stage III baseline creatinine 1.3-2 - renal function worsening - hold furhter diuresis for now and follow   Acute on chronic diastolic heart failure EF 50-55% via TTE April 2015, w/ grade 1 DD - negative 9+L since admit - weight 156kg which appears to be well below his baseline since June of this year of ~167 - weight yesterday not likely accurate (165kg)  Morbid obesity - Body mass index is 43.1 kg/(m^2).  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 rate 15, height 6' 5.17" (1.96 m), weight 165.563 kg (365 lb), SpO2 98.00%.  Intake/Output Summary (Last 24 hours) at 08/10/13 1253 Last data filed at 08/10/13 1230  Gross per 24 hour  Intake    120 ml  Output   2225 ml  Net  -2105 ml   Exam: General: No acute respiratory distress - alert and conversant  Lungs: no wheezes or crackles - distant bs th/o  Cardiovascular: regular rate and rythmn  Extremities: No significant cyanosis, clubbing; trace edema bilateral lower extremities w/ change c/w chronic venous stasis dermatitis   Data Reviewed: Basic Metabolic Panel:  Recent Labs Lab 08/04/13 0327 08/05/13 0324 08/07/13 0238 08/10/13 0341  NA 142 136* 134* 133*  K 4.4 4.1 4.1 3.9  CL 99 97 96 97  CO2 28 27 25 23   GLUCOSE 152* 145* 173* 224*  BUN 18 20 24* 40*  CREATININE 1.34 1.47* 1.48* 1.79*  CALCIUM 9.2 8.8 8.6 8.6    Liver Function Tests: No results found for this basename: AST, ALT, ALKPHOS, BILITOT, PROT, ALBUMIN,  in the last 168 hours  Coags:  Recent Labs Lab 08/06/13 0305 08/07/13 0238 08/08/13 0327 08/09/13 0249 08/10/13 0341  INR 1.92* 1.88* 2.13* 2.10* 2.15*   No results found for this basename: PTT,  in the last 168 hours  CBC:  Recent Labs Lab  08/05/13 0324  WBC 5.1  HGB 12.0*  HCT 36.6*  MCV 84.3  PLT 186   CBG:  Recent Labs Lab 08/09/13 1207 08/09/13 1630 08/09/13 2154 08/10/13 0743 08/10/13 1219  GLUCAP 172* 162* 184* 168* 160*    Recent Results (from the past 240 hour(s))  MRSA PCR SCREENING     Status: None   Collection Time    08/03/13  3:03 PM      Result Value Ref Range Status   MRSA by PCR NEGATIVE  NEGATIVE Final   Comment:            The GeneXpert MRSA Assay (FDA     approved for NASAL specimens     only), is one component of a     comprehensive MRSA colonization     surveillance program. It is not     intended to diagnose MRSA     infection nor to guide or     monitor treatment for     MRSA infections.    Studies:  Recent x-ray studies have been reviewed in detail by the Attending Physician  Scheduled Meds:  Scheduled Meds: . alprazolam  2 mg Oral QHS  . amLODipine  10 mg Oral Daily  . carvedilol  6.25 mg Oral BID WC  . citalopram  20 mg Oral Daily  . cloNIDine  0.1 mg Oral BID  . furosemide  40 mg Oral Daily  . insulin aspart  0-5 Units Subcutaneous QHS  . insulin aspart  0-9 Units Subcutaneous TID WC  . insulin glargine  20 Units Subcutaneous q morning - 10a  . losartan  100 mg Oral Daily  . metoCLOPramide  5 mg Oral TID  . polyethylene glycol  17 g Oral Daily  . potassium chloride SA  20 mEq Oral Daily  . senna-docusate  1 tablet Oral BID  . simvastatin  20 mg Oral q1800  . warfarin  7.5 mg Oral q1800  . Warfarin - Pharmacist Dosing Inpatient   Does not apply q1800    Time spent on care of this patient: 25 mins   [facility] T , MD   [facility] Office  [phone] Pager - Text Page per Amion as per below:  On-Call/Text Page:      [email or web address]      password [email or web address]  If 7PM-7AM, please contact night-coverage [email or web address] Password [facility] 08/10/2013, 12:53 PM   LOS: 7 days

## 2013-08-11 LAB — BASIC METABOLIC PANEL
ANION GAP: 13 (ref 5–15)
BUN: 29 mg/dL — ABNORMAL HIGH (ref 6–23)
CHLORIDE: 98 meq/L (ref 96–112)
CO2: 24 mEq/L (ref 19–32)
CREATININE: 1.49 mg/dL — AB (ref 0.50–1.35)
Calcium: 9.2 mg/dL (ref 8.4–10.5)
GFR calc Af Amer: 54 mL/min — ABNORMAL LOW (ref >90)
GFR calc non Af Amer: 47 mL/min — ABNORMAL LOW (ref >90)
Glucose, Bld: 173 mg/dL — ABNORMAL HIGH (ref 70–99)
Potassium: 4.6 mEq/L (ref 3.7–5.3)
Sodium: 135 mEq/L — ABNORMAL LOW (ref 137–147)

## 2013-08-11 LAB — PROTIME-INR
INR: 1.71 — AB (ref 0.00–1.49)
Prothrombin Time: 20.1 seconds — ABNORMAL HIGH (ref 11.6–15.2)

## 2013-08-11 LAB — GLUCOSE, CAPILLARY
GLUCOSE-CAPILLARY: 168 mg/dL — AB (ref 70–99)
Glucose-Capillary: 194 mg/dL — ABNORMAL HIGH (ref 70–99)
Glucose-Capillary: 128 mg/dL — ABNORMAL HIGH (ref 70–99)

## 2013-08-11 MED ORDER — POTASSIUM CHLORIDE CRYS ER 20 MEQ PO TBCR: 20.0000 meq | EXTENDED_RELEASE_TABLET | Freq: Every day | ORAL | Status: DC

## 2013-08-11 MED ORDER — WARFARIN SODIUM 10 MG PO TABS
10.0000 mg | ORAL_TABLET | Freq: Once | ORAL | Status: AC
  Administered 2013-08-11: 10 mg via ORAL
  Filled 2013-08-11: qty 1

## 2013-08-11 MED ORDER — POLYETHYLENE GLYCOL 3350 17 G PO PACK: 17.0000 g | PACK | Freq: Every day | ORAL | Status: DC

## 2013-08-11 MED ORDER — OXYCODONE HCL 10 MG PO TABS: 10.0000 mg | ORAL_TABLET | Freq: Four times a day (QID) | ORAL | Status: DC | PRN

## 2013-08-11 MED ORDER — SENNOSIDES-DOCUSATE SODIUM 8.6-50 MG PO TABS: 1.0000 | ORAL_TABLET | Freq: Two times a day (BID) | ORAL | Status: DC

## 2013-08-11 MED ORDER — ALPRAZOLAM 2 MG PO TABS: 4.0000 mg | ORAL_TABLET | Freq: Every day | ORAL | Status: DC

## 2013-08-11 MED ORDER — LOSARTAN POTASSIUM 100 MG PO TABS: 50.0000 mg | ORAL_TABLET | Freq: Every day | ORAL | Status: DC

## 2013-08-11 NOTE — Clinical Social Work Note (Signed)
Patient will discharge to [facility] on 08/12/13. Contact made with Stacy at [place] and clinicals transmitted 7/27.  Vasily Fedewa Givens, MSW, LCSW [phone]

## 2013-08-11 NOTE — Discharge Summary (Addendum)
DISCHARGE SUMMARY  Travis Chapman  MR#: [id number]  DOB:1945/09/27  Date of Admission: 08/03/2013 Date of Discharge: 08/11/2013  Attending Physician:MCCLUNG,JEFFREY T  Patient's QPY:[id number], Travis Chapman  Consults: None   Disposition: D/C to [facility]  Follow-up Appts: To be attended to by the Chapman of record at his chosen SNF  Discharge Diagnoses: Altered mental status  CKD stage III  Acute on chronic diastolic heart failure  Morbid obesity - Body mass index is 43.1 kg/(m^2).  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 Patient was seen in the ED 2 days prior for similar complaints and his tracheostomy was found to have a mucous plug. Patient went home on oral antibiotics. He has had several admissions during the past year and has refused skilled nursing when previously offered.  In the emergency room the patient was noted to have mild acute respiratory failure with a PCO2 in the 60s as well as an INR of 4.3. Family indicated that they would like for patient to be placed in a SNF.   Hospital Course: 7/28 patient states feels greatly improved, ready for discharge to SNF    Altered mental status  -Resolved at this time - mental status at baseline/normal - I71 high - folic acid normal - idiopathic  CKD stage III  baseline creatinine 1.3-2 - renal function worsened w/ aggressive diuresis, but improved with holding of same - resume usual home diuretic dose at d/c - follow Is/Os and wgts  closely at SNF   Acute on chronic diastolic heart failure  -EF 50-55% via TTE April 2015, w/ grade 1 DD  - negative 10L since admit  - 7/26 weight bed =1 65.5 kg, which is close to his baseline since June of this year of ~167kg   Morbid obesity - Body mass index is 43.1 kg/(m^2).   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  -CBG stable at this time   HTN - uncontrolled  -cont current regimen w/o change   HLD  -Cont home medical tx   Severe B knee pain  - had 4 view xrays of B knees in March c/w severe osteoarthritis  -Most c/w osteoarthritis due to moribid obesity - PT at SNF -pain control will discharge on current medications- no exam findings to suggest effusion       Medication List    STOP taking these medications       doxycycline 100 MG capsule  Commonly known as:  VIBRAMYCIN     oxyCODONE-acetaminophen 10-325 MG per tablet  Commonly known as:  PERCOCET      TAKE these medications       alprazolam 2 MG tablet  Commonly known as:  XANAX  Take 2 tablets (4 mg total) by mouth at bedtime.     amLODipine 10 MG tablet  Commonly known as:  NORVASC  Take 1 tablet (10 mg total) by mouth daily.     carvedilol 6.25 MG tablet  Commonly known as:  COREG  Take 6.25 mg by mouth 2 (two) times daily with a meal.     citalopram 20 MG tablet  Commonly known as:  CELEXA  Take 1 tablet (20 mg total) by mouth daily.     cloNIDine 0.1 MG tablet  Commonly known as:  CATAPRES  Take 0.1 mg by mouth 2 (two) times  daily.     furosemide 20 MG tablet  Commonly known as:  LASIX  Take 3 tablets (60 mg total) by mouth daily.     insulin glargine 100 UNIT/ML injection  Commonly known as:  LANTUS  Inject 20 Units into the skin every morning.     losartan 100 MG tablet  Commonly known as:  COZAAR  Take 0.5 tablets (50 mg total) by mouth daily.     metoCLOPramide 5 MG tablet  Commonly known as:  REGLAN  Take 5 mg by mouth 3 (three) times daily.     multivitamin tablet  Take 1 tablet by mouth daily.     Oxycodone HCl 10 MG Tabs  Take 1 tablet (10 mg total) by mouth every 6 (six) hours as needed for severe pain.     polyethylene glycol packet  Commonly known as:  MIRALAX / GLYCOLAX  Take 17 g by mouth daily.     potassium chloride SA 20 MEQ tablet  Commonly known as:  K-DUR,KLOR-CON  Take 1 tablet (20 mEq total) by mouth daily.     senna-docusate 8.6-50 MG per tablet  Commonly known as:  Senokot-S  Take 1 tablet by mouth 2 (two) times daily.     simvastatin 20 MG tablet  Commonly known as:  ZOCOR  Take 20 mg by mouth daily.     warfarin 5 MG tablet  Commonly known as:  COUMADIN  Take 1.5 tablets (7.5 mg total) by mouth daily.       Day of Discharge BP 142/77  Pulse 72  Temp(Src) 98 F (36.7 C) (Oral)  Resp 18  Ht 6' 5.17" (1.96 m)  Wt 165.563 kg (365 lb)  BMI 43.10 kg/m2  SpO2 95%  Physical Exam: General: No acute respiratory distress Lungs: Clear to auscultation bilaterally without wheezes or crackles - distant BS th/o  Cardiovascular: Regular rate and rhythm without murmur gallop or rub - distant HS  Abdomen: morbidly obese, nontender, nondistended, soft, bowel sounds positive, no rebound, no ascites, no appreciable mass Extremities: No significant cyanosis, or clubbing;  1+ edema bilateral lower extremities  Results for orders placed during the hospital encounter of 08/03/13 (from the past 24 hour(s))  GLUCOSE, CAPILLARY     Status: Abnormal   Collection Time     08/10/13  4:10 PM      Result Value Ref Range   Glucose-Capillary 174 (*) 70 - 99 mg/dL  GLUCOSE, CAPILLARY     Status: Abnormal   Collection Time    08/10/13  9:42 PM      Result Value Ref Range   Glucose-Capillary 176 (*) 70 - 99 mg/dL  GLUCOSE, CAPILLARY     Status: Abnormal  Collection Time    08/11/13  8:05 AM      Result Value Ref Range   Glucose-Capillary 194 (*) 70 - 99 mg/dL  PROTIME-INR     Status: Abnormal   Collection Time    08/11/13  8:46 AM      Result Value Ref Range   Prothrombin Time 20.1 (*) 11.6 - 15.2 seconds   INR 1.71 (*) 0.00 - 0.81  BASIC METABOLIC PANEL     Status: Abnormal   Collection Time    08/11/13  8:46 AM      Result Value Ref Range   Sodium 135 (*) 137 - 147 mEq/L   Potassium 4.6  3.7 - 5.3 mEq/L   Chloride 98  96 - 112 mEq/L   CO2 24  19 - 32 mEq/L   Glucose, Bld 173 (*) 70 - 99 mg/dL   BUN 29 (*) 6 - 23 mg/dL   Creatinine, Ser 1.49 (*) 0.50 - 1.35 mg/dL   Calcium 9.2  8.4 - 10.5 mg/dL   GFR calc non Af Amer 47 (*) >90 mL/min   GFR calc Af Amer 54 (*) >90 mL/min   Anion gap 13  5 - 15  GLUCOSE, CAPILLARY     Status: Abnormal   Collection Time    08/11/13 11:53 AM      Result Value Ref Range   Glucose-Capillary 168 (*) 70 - 99 mg/dL    Time spent in discharge (includes decision making & examination of pt): >30 minutes  08/11/2013, 2:20 PM   Dia Crawford, [facility] Office  [phone] Pager [phone]  On-Call/Text Page:      [email or web address]      password [facility]

## 2013-08-11 NOTE — Progress Notes (Signed)
Made aware by social worker Lorriane Shire that pt will be transferred to [facility] on Tuesday morning. 7/28 Teka Chanda M. Dalbert Batman, RN, BSN 08/11/2013 6:16 PM

## 2013-08-11 NOTE — Progress Notes (Signed)
CSW received a message from [place] at [facility] that pt has a bed available today. CSW relayed message to social worker covering today. CSW to follow and assist with dc.  Hunt Oris, MSW, [place]

## 2013-08-11 NOTE — Progress Notes (Signed)
Physical Therapy Treatment Patient Details Name: Travis Chapman MRN: [id number] DOB: Sep 19, 1945 Today's Date: 08/11/2013    History of Present Illness Pt adm with confusion and incr falls. PMHx-Long term trach - on vent at night. Morbid obesity, DM, CHF, severe arthritis of knees with frequent falls and 5 admissions since 2/15 for falls    PT Comments    Pt eager to participate. Reports he lost his exercise handout and was unable to recall exercises he should do over the weekend. (Found handout in his top drawer of his dresser). Pt reports he will work on bed level exercises when he returns to bed later today (did perform chair level exercises today). Pt tolerated walking a much further distance than he has been able to tolerate for several months (per pt).    Follow Up Recommendations  SNF;Supervision for mobility/OOB;LTACH (needs vent facility)     Equipment Recommendations  None recommended by PT    Recommendations for Other Services       Precautions / Restrictions Precautions Precautions: Fall Precaution Comments: numerous due to knees buckling without warning    Mobility  Bed Mobility               General bed mobility comments: up in recliner; did not want to return to bed; concerned nursing would not know how to "put his bed back together" (OT had deflated and removed raiil). Brought nurse tech in for education   Transfers Overall transfer level: Needs assistance Equipment used: Rolling walker (2 wheeled) Transfers: Sit to/from Stand Sit to Stand: Min guard         General transfer comment: no instructional cues required; incr time and effort  Ambulation/Gait Ambulation/Gait assistance: Min assist;+2 safety/equipment Ambulation Distance (Feet): 30 Feet Assistive device: Rolling walker (2 wheeled) (bariatric) Gait Pattern/deviations: Step-through pattern;Decreased stride length;Trunk flexed Gait velocity: decr Gait velocity interpretation: <1.8 ft/sec,  indicative of risk for recurrent falls General Gait Details: pt reports his knees are feeling good today and wanted to walk; instructed pt to maintain forward flexed/hip flexed posture (leaning forward on his hands on RW) to maintain extensor moment at knees; close follow with recliner for safety due to frequent bucking/falls per pt report   Stairs            Wheelchair Mobility    Modified Rankin (Stroke Patients Only)       Balance                                    Cognition Arousal/Alertness: Awake/alert Behavior During Therapy: WFL for tasks assessed/performed Overall Cognitive Status: Within Functional Limits for tasks assessed                      Exercises General Exercises - Lower Extremity Straight Leg Raises:  (instructed, pt could not perform in chair; to do in bed) Hip Flexion/Marching: Both;15 reps;Seated Toe Raises: Both;20 reps;Seated Heel Raises: Both;20 reps;Seated Low Level/ICU Exercises Quad Sets:  (instructed, pt could not perform in chair; to do in bed) Heel Slides:  (instructed, pt could not perform in chair; to do in bed) Stabilized Bridging:  (instructed, pt could not perform in chair; to do in bed) Other Exercises Other Exercises: chair pushups x 10; pt unable to fully extend elbows    General Comments        Pertinent Vitals/Pain On RA with trach on arrival VSS per monitor throughout  session    Home Living                      Prior Function            PT Goals (current goals can now be found in the care plan section) Acute Rehab PT Goals Patient Stated Goal: To regain independence Progress towards PT goals: Progressing toward goals    Frequency  Min 3X/week    PT Plan Current plan remains appropriate    Co-evaluation             End of Session   Activity Tolerance: Patient limited by fatigue Patient left: in chair;with call bell/phone within reach;with nursing/sitter in room      Time: 1035-1059 PT Time Calculation (min): 24 min  Charges:  $Gait Training: 8-22 mins $Therapeutic Exercise: 8-22 mins                    G Codes:      Karin Griffith Sep 06, 2013, 12:31 PM Pager [phone]

## 2013-08-11 NOTE — Progress Notes (Signed)
ANTICOAGULATION CONSULT NOTE - Follow Up Consult  Pharmacy Consult for Coumadin Indication: hx recurrent PE  No Known Allergies  Patient Measurements: Height: 6' 5.17" (196 cm) Weight:  (bed scale error) IBW/kg (Calculated) : 89.48  Vital Signs: Temp: 98 F (36.7 C) (07/27 1151) Temp src: Oral (07/27 1151) BP: 142/77 mmHg (07/27 1151) Pulse Rate: 66 (07/27 1151)  Labs:  Recent Labs  08/09/13 0249 08/10/13 0341 08/11/13 0846  LABPROT 23.6* 24.0* 20.1*  INR 2.10* 2.15* 1.71*  CREATININE  --  1.79* 1.49*    Estimated Creatinine Clearance: 81.6 ml/min (by C-G formula based on Cr of 1.49).  Assessment: 46 yom admitted for increased confusion/frequent falls. He continues on coumadin for hx recurrent PE. INR noted 4.52 on admit, potentially related to drug interaction with doxycycline PTA. Now INR subtherapeutic 1.71 (down from 2.15). No bleeding documented.   Home coumadin dose: 7.5mg /day   Goal of Therapy:  INR 2-3   Plan:  -Increase Coumadin 10mg  x 1 dose @1300  to better assess effect on INR -Daily PT/INR -Monitor signs of bleeding  Elicia Lamp, PharmD Clinical Pharmacist - Resident Pager [phone] 7/27/201512:01 PM

## 2013-08-12 LAB — GLUCOSE, CAPILLARY
GLUCOSE-CAPILLARY: 182 mg/dL — AB (ref 70–99)
GLUCOSE-CAPILLARY: 165 mg/dL — AB (ref 70–99)
Glucose-Capillary: 133 mg/dL — ABNORMAL HIGH (ref 70–99)

## 2013-08-12 LAB — PROTIME-INR
INR: 2.25 — ABNORMAL HIGH (ref 0.00–1.49)
PROTHROMBIN TIME: 24.9 s — AB (ref 11.6–15.2)

## 2013-08-12 MED ORDER — WARFARIN SODIUM 7.5 MG PO TABS
7.5000 mg | ORAL_TABLET | Freq: Every day | ORAL | Status: DC
  Filled 2013-08-12: qty 1

## 2013-08-12 NOTE — Progress Notes (Signed)
ANTICOAGULATION CONSULT NOTE - Follow Up Consult  Pharmacy Consult for Coumadin Indication: hx recurrent PE  No Known Allergies  Patient Measurements: Height: 6' 5.17" (196 cm) Weight:  (bed scale error) IBW/kg (Calculated) : 89.48  Vital Signs: Temp: 98.2 F (36.8 C) (07/28 0814) Temp src: Oral (07/28 0814) BP: 130/95 mmHg (07/28 0826) Pulse Rate: 68 (07/28 0826)  Labs:  Recent Labs  08/10/13 0341 08/11/13 0846 08/12/13 0320  LABPROT 24.0* 20.1* 24.9*  INR 2.15* 1.71* 2.25*  CREATININE 1.79* 1.49*  --     Estimated Creatinine Clearance: 81.6 ml/min (by C-G formula based on Cr of 1.49).  Assessment: 65 yom admitted for increased confusion/frequent falls. He continues on coumadin for hx recurrent PE. INR noted 4.52 on admit, potentially related to drug interaction with doxycycline PTA. Now INR therapeutic 2.25 (up from 1.71). No bleeding documented.   Home coumadin dose: 7.5mg /day   Goal of Therapy:  INR 2-3   Plan:  -Give Coumadin 7.5 mg daily -Daily PT/INR while inpatient. Would recommend f/u INR at end of week when discharged to SNF -Monitor signs of bleeding  Elicia Lamp, PharmD Clinical Pharmacist - Resident Pager [phone] 7/28/20159:20 AM

## 2013-08-20 ENCOUNTER — Encounter: Payer: Self-pay | Admitting: Endocrinology

## 2013-08-22 ENCOUNTER — Encounter: Payer: Self-pay | Admitting: Endocrinology

## 2013-08-28 ENCOUNTER — Telehealth: Payer: Self-pay

## 2013-08-28 NOTE — Telephone Encounter (Signed)
Diabetic Bundle. Unable to reach pt.

## 2013-09-05 ENCOUNTER — Telehealth: Payer: Self-pay

## 2013-09-05 NOTE — Telephone Encounter (Signed)
Please advise sender of fax we can't complete, as pt is in a facility

## 2013-09-05 NOTE — Telephone Encounter (Signed)
Called pt in regards to Wound care Evaluation. Pt's daughter states that pt is currently at [employer].  Please advise, Thanks!

## 2013-09-05 NOTE — Telephone Encounter (Signed)
Wound Care evaluation company advised.

## 2013-09-17 ENCOUNTER — Telehealth: Payer: Self-pay

## 2013-09-17 NOTE — Telephone Encounter (Signed)
ok 

## 2013-09-17 NOTE — Telephone Encounter (Signed)
Physical therapist notified.

## 2013-09-17 NOTE — Telephone Encounter (Signed)
Rosalie Doctor Physical Therapist with [facility] called requesting a verbal ok for in home physical therapy. Therapist is requesting for 3 times a week for 3 weeks and then 2 times a week for 2 weeks. Areas of focus will be strengthening, ambulation and home safety.  Please advise, Thanks!

## 2013-09-18 ENCOUNTER — Telehealth: Payer: Self-pay

## 2013-09-18 NOTE — Telephone Encounter (Signed)
ok 

## 2013-09-18 NOTE — Telephone Encounter (Signed)
Occupation therapy advised.

## 2013-09-18 NOTE — Telephone Encounter (Signed)
Mallory from [facility] called requesting Verbal orders to continue with occupation therapy. Occupation therapy will be 2 times a week for 2 weeks. [place] for verbal orders? Thanks!

## 2013-09-19 ENCOUNTER — Ambulatory Visit: Payer: Medicare Other | Admitting: Endocrinology

## 2013-09-23 ENCOUNTER — Telehealth: Payer: Self-pay | Admitting: Endocrinology

## 2013-09-23 NOTE — Telephone Encounter (Signed)
Mallory Percell therapist  is requesting verbal order for two additional occupational visits September 13 for patient. Phone # [phone]

## 2013-09-23 NOTE — Telephone Encounter (Signed)
Ov needed before any further orders

## 2013-09-23 NOTE — Telephone Encounter (Signed)
See below ok for verbal orders? Thanks!

## 2013-09-24 ENCOUNTER — Emergency Department (HOSPITAL_COMMUNITY): Payer: Medicare Other

## 2013-09-24 ENCOUNTER — Ambulatory Visit: Payer: Medicare Other | Admitting: Endocrinology

## 2013-09-24 ENCOUNTER — Encounter (HOSPITAL_COMMUNITY): Payer: Self-pay | Admitting: Emergency Medicine

## 2013-09-24 ENCOUNTER — Inpatient Hospital Stay (HOSPITAL_COMMUNITY)
Admission: EM | Admit: 2013-09-24 | Discharge: 2013-09-29 | DRG: 948 | Disposition: A | Discharge status: Home Health | Payer: Medicare Other | Attending: Internal Medicine | Admitting: Internal Medicine

## 2013-09-24 DIAGNOSIS — Z79899 Other long term (current) drug therapy: Secondary | ICD-10-CM

## 2013-09-24 DIAGNOSIS — I5032 Chronic diastolic (congestive) heart failure: Secondary | ICD-10-CM

## 2013-09-24 DIAGNOSIS — E1143 Type 2 diabetes mellitus with diabetic autonomic (poly)neuropathy: Secondary | ICD-10-CM

## 2013-09-24 DIAGNOSIS — I493 Ventricular premature depolarization: Secondary | ICD-10-CM

## 2013-09-24 DIAGNOSIS — G4733 Obstructive sleep apnea (adult) (pediatric): Secondary | ICD-10-CM | POA: Diagnosis present

## 2013-09-24 DIAGNOSIS — R809 Proteinuria, unspecified: Secondary | ICD-10-CM

## 2013-09-24 DIAGNOSIS — E7800 Pure hypercholesterolemia, unspecified: Secondary | ICD-10-CM

## 2013-09-24 DIAGNOSIS — M1710 Unilateral primary osteoarthritis, unspecified knee: Secondary | ICD-10-CM | POA: Diagnosis present

## 2013-09-24 DIAGNOSIS — Z794 Long term (current) use of insulin: Secondary | ICD-10-CM

## 2013-09-24 DIAGNOSIS — E1039 Type 1 diabetes mellitus with other diabetic ophthalmic complication: Secondary | ICD-10-CM | POA: Diagnosis present

## 2013-09-24 DIAGNOSIS — J45909 Unspecified asthma, uncomplicated: Secondary | ICD-10-CM

## 2013-09-24 DIAGNOSIS — R531 Weakness: Secondary | ICD-10-CM

## 2013-09-24 DIAGNOSIS — W19XXXD Unspecified fall, subsequent encounter: Secondary | ICD-10-CM

## 2013-09-24 DIAGNOSIS — R262 Difficulty in walking, not elsewhere classified: Secondary | ICD-10-CM

## 2013-09-24 DIAGNOSIS — Z6841 Body Mass Index (BMI) 40.0 and over, adult: Secondary | ICD-10-CM

## 2013-09-24 DIAGNOSIS — Z930 Tracheostomy status: Secondary | ICD-10-CM

## 2013-09-24 DIAGNOSIS — Y92009 Unspecified place in unspecified non-institutional (private) residence as the place of occurrence of the external cause: Secondary | ICD-10-CM

## 2013-09-24 DIAGNOSIS — E1029 Type 1 diabetes mellitus with other diabetic kidney complication: Secondary | ICD-10-CM | POA: Diagnosis present

## 2013-09-24 DIAGNOSIS — R0989 Other specified symptoms and signs involving the circulatory and respiratory systems: Secondary | ICD-10-CM

## 2013-09-24 DIAGNOSIS — J9690 Respiratory failure, unspecified, unspecified whether with hypoxia or hypercapnia: Secondary | ICD-10-CM | POA: Diagnosis present

## 2013-09-24 DIAGNOSIS — I129 Hypertensive chronic kidney disease with stage 1 through stage 4 chronic kidney disease, or unspecified chronic kidney disease: Secondary | ICD-10-CM | POA: Diagnosis present

## 2013-09-24 DIAGNOSIS — F411 Generalized anxiety disorder: Secondary | ICD-10-CM

## 2013-09-24 DIAGNOSIS — Z87891 Personal history of nicotine dependence: Secondary | ICD-10-CM

## 2013-09-24 DIAGNOSIS — N183 Chronic kidney disease, stage 3 unspecified: Secondary | ICD-10-CM

## 2013-09-24 DIAGNOSIS — Z5181 Encounter for therapeutic drug level monitoring: Secondary | ICD-10-CM

## 2013-09-24 DIAGNOSIS — R9431 Abnormal electrocardiogram [ECG] [EKG]: Secondary | ICD-10-CM

## 2013-09-24 DIAGNOSIS — L97919 Non-pressure chronic ulcer of unspecified part of right lower leg with unspecified severity: Secondary | ICD-10-CM

## 2013-09-24 DIAGNOSIS — K3184 Gastroparesis: Secondary | ICD-10-CM

## 2013-09-24 DIAGNOSIS — D649 Anemia, unspecified: Secondary | ICD-10-CM | POA: Diagnosis present

## 2013-09-24 DIAGNOSIS — Z66 Do not resuscitate: Secondary | ICD-10-CM | POA: Diagnosis present

## 2013-09-24 DIAGNOSIS — Z5189 Encounter for other specified aftercare: Secondary | ICD-10-CM

## 2013-09-24 DIAGNOSIS — E11319 Type 2 diabetes mellitus with unspecified diabetic retinopathy without macular edema: Secondary | ICD-10-CM | POA: Diagnosis present

## 2013-09-24 DIAGNOSIS — Z86711 Personal history of pulmonary embolism: Secondary | ICD-10-CM

## 2013-09-24 DIAGNOSIS — R0902 Hypoxemia: Secondary | ICD-10-CM

## 2013-09-24 DIAGNOSIS — C61 Malignant neoplasm of prostate: Secondary | ICD-10-CM

## 2013-09-24 DIAGNOSIS — E1049 Type 1 diabetes mellitus with other diabetic neurological complication: Secondary | ICD-10-CM | POA: Diagnosis present

## 2013-09-24 DIAGNOSIS — J9610 Chronic respiratory failure, unspecified whether with hypoxia or hypercapnia: Secondary | ICD-10-CM | POA: Diagnosis present

## 2013-09-24 DIAGNOSIS — R002 Palpitations: Secondary | ICD-10-CM

## 2013-09-24 DIAGNOSIS — F1021 Alcohol dependence, in remission: Secondary | ICD-10-CM

## 2013-09-24 DIAGNOSIS — Z9911 Dependence on respirator [ventilator] status: Secondary | ICD-10-CM

## 2013-09-24 DIAGNOSIS — I10 Essential (primary) hypertension: Secondary | ICD-10-CM | POA: Diagnosis present

## 2013-09-24 DIAGNOSIS — I509 Heart failure, unspecified: Secondary | ICD-10-CM | POA: Diagnosis present

## 2013-09-24 DIAGNOSIS — L97929 Non-pressure chronic ulcer of unspecified part of left lower leg with unspecified severity: Secondary | ICD-10-CM

## 2013-09-24 DIAGNOSIS — R0609 Other forms of dyspnea: Secondary | ICD-10-CM

## 2013-09-24 DIAGNOSIS — R635 Abnormal weight gain: Secondary | ICD-10-CM

## 2013-09-24 DIAGNOSIS — R0789 Other chest pain: Secondary | ICD-10-CM

## 2013-09-24 DIAGNOSIS — E785 Hyperlipidemia, unspecified: Secondary | ICD-10-CM

## 2013-09-24 DIAGNOSIS — E662 Morbid (severe) obesity with alveolar hypoventilation: Secondary | ICD-10-CM | POA: Diagnosis present

## 2013-09-24 DIAGNOSIS — N289 Disorder of kidney and ureter, unspecified: Secondary | ICD-10-CM

## 2013-09-24 DIAGNOSIS — R5381 Other malaise: Principal | ICD-10-CM | POA: Diagnosis present

## 2013-09-24 DIAGNOSIS — I2699 Other pulmonary embolism without acute cor pulmonale: Secondary | ICD-10-CM

## 2013-09-24 DIAGNOSIS — F1011 Alcohol abuse, in remission: Secondary | ICD-10-CM | POA: Diagnosis present

## 2013-09-24 DIAGNOSIS — N058 Unspecified nephritic syndrome with other morphologic changes: Secondary | ICD-10-CM | POA: Diagnosis present

## 2013-09-24 DIAGNOSIS — J9611 Chronic respiratory failure with hypoxia: Secondary | ICD-10-CM

## 2013-09-24 DIAGNOSIS — W19XXXA Unspecified fall, initial encounter: Secondary | ICD-10-CM

## 2013-09-24 DIAGNOSIS — R509 Fever, unspecified: Secondary | ICD-10-CM

## 2013-09-24 DIAGNOSIS — R0600 Dyspnea, unspecified: Secondary | ICD-10-CM

## 2013-09-24 DIAGNOSIS — M1990 Unspecified osteoarthritis, unspecified site: Secondary | ICD-10-CM

## 2013-09-24 DIAGNOSIS — J9620 Acute and chronic respiratory failure, unspecified whether with hypoxia or hypercapnia: Secondary | ICD-10-CM

## 2013-09-24 DIAGNOSIS — I428 Other cardiomyopathies: Secondary | ICD-10-CM

## 2013-09-24 LAB — URINALYSIS, ROUTINE W REFLEX MICROSCOPIC
Bilirubin Urine: NEGATIVE
GLUCOSE, UA: NEGATIVE mg/dL
HGB URINE DIPSTICK: NEGATIVE
KETONES UR: NEGATIVE mg/dL
Leukocytes, UA: NEGATIVE
NITRITE: NEGATIVE
PH: 7.5 (ref 5.0–8.0)
Protein, ur: 100 mg/dL — AB
SPECIFIC GRAVITY, URINE: 1.016 (ref 1.005–1.030)
Urobilinogen, UA: 1 mg/dL (ref 0.0–1.0)

## 2013-09-24 LAB — COMPREHENSIVE METABOLIC PANEL
ALBUMIN: 3.3 g/dL — AB (ref 3.5–5.2)
ALK PHOS: 78 U/L (ref 39–117)
ALT: 10 U/L (ref 0–53)
AST: 16 U/L (ref 0–37)
Anion gap: 12 (ref 5–15)
BUN: 17 mg/dL (ref 6–23)
CHLORIDE: 103 meq/L (ref 96–112)
CO2: 26 mEq/L (ref 19–32)
CREATININE: 1.4 mg/dL — AB (ref 0.50–1.35)
Calcium: 9.2 mg/dL (ref 8.4–10.5)
GFR calc Af Amer: 59 mL/min — ABNORMAL LOW (ref >90)
GFR calc non Af Amer: 50 mL/min — ABNORMAL LOW (ref >90)
Glucose, Bld: 115 mg/dL — ABNORMAL HIGH (ref 70–99)
POTASSIUM: 4.2 meq/L (ref 3.7–5.3)
Sodium: 141 mEq/L (ref 137–147)
Total Bilirubin: 0.3 mg/dL (ref 0.3–1.2)
Total Protein: 7.3 g/dL (ref 6.0–8.3)

## 2013-09-24 LAB — URINE MICROSCOPIC-ADD ON

## 2013-09-24 LAB — CBG MONITORING, ED: Glucose-Capillary: 121 mg/dL — ABNORMAL HIGH (ref 70–99)

## 2013-09-24 LAB — CBC
HEMATOCRIT: 36 % — AB (ref 39.0–52.0)
Hemoglobin: 11.6 g/dL — ABNORMAL LOW (ref 13.0–17.0)
MCH: 27.6 pg (ref 26.0–34.0)
MCHC: 32.2 g/dL (ref 30.0–36.0)
MCV: 85.7 fL (ref 78.0–100.0)
PLATELETS: 182 10*3/uL (ref 150–400)
RBC: 4.2 MIL/uL — ABNORMAL LOW (ref 4.22–5.81)
RDW: 13.1 % (ref 11.5–15.5)
WBC: 6.4 10*3/uL (ref 4.0–10.5)

## 2013-09-24 LAB — I-STAT CG4 LACTIC ACID, ED: Lactic Acid, Venous: 1.38 mmol/L (ref 0.5–2.2)

## 2013-09-24 LAB — PROTIME-INR
INR: 1.67 — AB (ref 0.00–1.49)
PROTHROMBIN TIME: 19.7 s — AB (ref 11.6–15.2)

## 2013-09-24 LAB — PRO B NATRIURETIC PEPTIDE: Pro B Natriuretic peptide (BNP): 7.4 pg/mL (ref 0–125)

## 2013-09-24 MED ORDER — OXYCODONE-ACETAMINOPHEN 5-325 MG PO TABS
2.0000 | ORAL_TABLET | Freq: Once | ORAL | Status: AC
  Administered 2013-09-24: 2 via ORAL
  Filled 2013-09-24: qty 2

## 2013-09-24 NOTE — Care Management (Signed)
ED CM spoke with Dr. Mingo Amber EDP results from head CT showed no acute findings. EDP will consult Hospitalist   regarding patient.  Attempted to  update [facility] CSW regarding patient.

## 2013-09-24 NOTE — ED Notes (Signed)
Patient transported to X-ray 

## 2013-09-24 NOTE — Telephone Encounter (Signed)
Called pt's home number. Pt was admitted into the hospital today. Occupational therapist Mallory notified that an office visit would be needed before any other verbal orders could be give.

## 2013-09-24 NOTE — Consult Note (Signed)
Name: Travis Chapman MRN: [id number] DOB: [birth year]    ADMISSION DATE:  09/24/2013 CONSULTATION DATE:  09/24/2013  REFERRING MD :  [facility]  CHIEF COMPLAINT:  Weak  BRIEF PATIENT DESCRIPTION:  68 yo male presented with feeling weak.  He has hx of OSA/OHS with chronic trach and nocturnal vent.  PCCM asked to assist with trach and vent management.  He is followed by Dr. Lamonte Sakai in pulmonary office.  SIGNIFICANT EVENTS: 9/09 Admit  STUDIES:  9/09 CT head >> negative  HISTORY OF PRESENT ILLNESS:   He was in hospital in July with confusion and frequent falls.  He was d/c to [facility] for rehab, and he reports this helped.  He was at home walking and felt weak in his legs.  He then fell.  He was then brought to the ER.  He denies headache, chest pain, dyspnea, abdominal pain, or diarrhea.  He has intermittent cough with clear sputum.  He feels hungry.  He denies difficulty with speech.  PAST MEDICAL HISTORY :  Past Medical History  Diagnosis Date  . ANXIETY 07/23/2006  . ASTHMA 07/23/2006  . DM 05/23/2007  . HYPERTENSION 07/23/2006  . OSTEOARTHRITIS 07/23/2006  . HYPERCHOLESTEROLEMIA 05/23/2007  . ANEMIA 08/31/2008  . Palpitations 02/10/2008  . ALCOHOL ABUSE, IN REMISSION, HX OF 08/26/2008  . TOBACCO ABUSE, HX OF 08/26/2008  . Pulmonary embolism 03/16/2010  . ED (erectile dysfunction)   . Morbid obesity   . DM retinopathy   . 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 DM neuropathy with neurologic complication   . CHF (congestive heart failure)     With a preserved EF  . 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 Sleep apnea   . Shortness of breath   . CARCINOMA, PROSTATE 09/29/2009   Past Surgical History  Procedure Laterality Date  . Back surgery    . Tracheostomy     Prior to Admission medications   Medication Sig Start Date End Date Taking? Authorizing Provider  acetaminophen (TYLENOL) 650 MG CR tablet Take 1,300  mg by mouth every 8 (eight) hours as needed for pain.   Yes Historical Provider, MD  alprazolam Duanne Moron) 2 MG tablet Take 2 tablets (4 mg total) by mouth at bedtime. 08/11/13  Yes Cherene Altes, MD  amLODipine (NORVASC) 10 MG tablet Take 1 tablet (10 mg total) by mouth daily. 07/04/13  Yes Renato Shin, MD  carvedilol (COREG) 6.25 MG tablet Take 6.25 mg by mouth 2 (two) times daily with a meal.   Yes Historical Provider, MD  citalopram (CELEXA) 20 MG tablet Take 1 tablet (20 mg total) by mouth daily. 04/30/13  Yes Cherene Altes, MD  cloNIDine (CATAPRES) 0.1 MG tablet Take 0.1 mg by mouth 2 (two) times daily.  02/12/13  Yes Renato Shin, MD  furosemide (LASIX) 20 MG tablet Take 3 tablets (60 mg total) by mouth daily. 07/07/13  Yes Janece Canterbury, MD  insulin glargine (LANTUS) 100 UNIT/ML injection Inject 25 Units into the skin every morning.  01/13/13  Yes Renato Shin, MD  losartan (COZAAR) 100 MG tablet Take 0.5 tablets (50 mg total) by mouth daily. 08/11/13  Yes Cherene Altes, MD  metoCLOPramide (REGLAN) 5 MG tablet Take 5 mg by mouth 3 (three) times daily.   Yes Historical Provider, MD  Multiple Vitamin (MULTIVITAMIN) tablet Take 1 tablet by mouth daily.  Yes Historical Provider, MD  oxyCODONE 10 MG TABS Take 1 tablet (10 mg total) by mouth every 6 (six) hours as needed for severe pain. 08/11/13  Yes Cherene Altes, MD  polyethylene glycol [facility] / GLYCOLAX) packet Take 17 g by mouth daily. 08/11/13  Yes Cherene Altes, MD  potassium chloride SA (K-DUR,KLOR-CON) 20 MEQ tablet Take 1 tablet (20 mEq total) by mouth daily. 08/11/13  Yes Cherene Altes, MD  senna-docusate (SENOKOT-S) 8.6-50 MG per tablet Take 1 tablet by mouth 2 (two) times daily. 08/11/13  Yes Cherene Altes, MD  simvastatin (ZOCOR) 20 MG tablet Take 20 mg by mouth daily.   Yes Historical Provider, MD  warfarin (COUMADIN) 5 MG tablet Take 1.5 tablets (7.5 mg total) by mouth daily. 08/02/13  Yes Donita Brooks, NP    No Known Allergies  FAMILY HISTORY:  Family History  Problem Relation Age of Onset  . Diabetes Mellitus II Father   . CAD Father   . Cancer Other    SOCIAL HISTORY:  reports that he quit smoking about 8 years ago. His smoking use included Cigarettes. He has a 7.5 pack-year smoking history. He does not have any smokeless tobacco history on file. He reports that he does not drink alcohol or use illicit drugs.  REVIEW OF SYSTEMS:   Negative except above  SUBJECTIVE:   VITAL SIGNS: Temp:  [97 F (36.1 C)-98.2 F (36.8 C)] 98.2 F (36.8 C) (09/09 1534) Pulse Rate:  [60-75] 69 (09/09 2136) Resp:  [11-22] 22 (09/09 2136) BP: (142-168)/(62-133) 167/80 mmHg (09/09 1945) SpO2:  [91 %-98 %] 96 % (09/09 2136) FiO2 (%):  [21 %] 21 % (09/09 1450) Weight:  [360 lb (163.295 kg)] 360 lb (163.295 kg) (09/09 1410)  PHYSICAL EXAMINATION: General: no distress Neuro:  Alert, normal strength, CN intact HEENT:  Wears glasses, pupils reactive, trach site clean Cardiovascular:  Regular, no murmur Lungs:  Decreased breath sounds, no wheeze Abdomen:  Obese, soft, non tender, + bowel sounds Musculoskeletal:  1+ non pitting edema Skin:  Chronic venous stasis changes   CBC Recent Labs     09/24/13  1701  WBC  6.4  HGB  11.6*  HCT  36.0*  PLT  182    Coag's Recent Labs     09/24/13  1701  INR  1.67*    BMET Recent Labs     09/24/13  1701  NA  141  K  4.2  CL  103  CO2  26  BUN  17  CREATININE  1.40*  GLUCOSE  115*    Electrolytes Recent Labs     09/24/13  1701  CALCIUM  9.2   Liver Enzymes Recent Labs     09/24/13  1701  AST  16  ALT  10  ALKPHOS  78  BILITOT  0.3  ALBUMIN  3.3*    Cardiac Enzymes Recent Labs     09/24/13  1701  PROBNP  7.4    Glucose Recent Labs     09/24/13  1408  GLUCAP  121*    Imaging Dg Chest 2 View  09/24/2013   CLINICAL DATA:  Fall.  Hypertension.  EXAM: CHEST  2 VIEW  COMPARISON:  08/03/2013.  FINDINGS: Tracheostomy  in stable position. Stable cardiomegaly and pulmonary vascular congestion. No focal infiltrates identified. Costophrenic angles not imaged. No pleural effusion however noted. No pneumothorax. No acute bony abnormality.  IMPRESSION: 1. Tracheostomy in stable position. 2. Stable cardiomegaly and pulmonary  venous congestion.   Electronically Signed   By: Marcello Moores  Register   On: 09/24/2013 16:14   Dg Pelvis 1-2 Views  09/24/2013   CLINICAL DATA:  Bilateral hip pain after a fall.  EXAM: PELVIS - 1-2 VIEW  COMPARISON:  CT pelvis 12/29/2008  FINDINGS: Technically limited study due to patient's body habitus resulting in underpenetration of the image. There is no evidence of pelvic fracture or diastasis. No other pelvic bone lesions are seen. Surgical clips in the low pelvis.  IMPRESSION: No displaced fractures identified.   Electronically Signed   By: Lucienne Capers M.D.   On: 09/24/2013 21:21   Ct Head Wo Contrast  09/24/2013   CLINICAL DATA:  Fall.  EXAM: CT HEAD WITHOUT CONTRAST  TECHNIQUE: Contiguous axial images were obtained from the base of the skull through the vertex without intravenous contrast.  COMPARISON:  None.  FINDINGS: Prominence of the sulci and ventricles noted compatible with brain atrophy. There is mild low attenuation within the subcortical and periventricular white matter compatible with chronic microvascular disease. There is no evidence for acute brain infarct, intracranial hemorrhage or mass. The paranasal sinuses and the mastoid air cells are clear. The skull is intact.  IMPRESSION: No acute intracranial abnormalities.  Small vessel ischemic disease and atrophy.   Electronically Signed   By: Kerby Moors M.D.   On: 09/24/2013 20:06   Dg Knee Complete 4 Views Left  09/24/2013   CLINICAL DATA:  Bilateral knee pain after a fall.  EXAM: LEFT KNEE - COMPLETE 4+ VIEW  COMPARISON:  03/24/2013  FINDINGS: Prominent degenerative changes in the left knee involving all 3 compartments. Medial  compartment narrowing and hypertrophic changes predominate. Chondrocalcinosis is present. Heterotopic calcification throughout the suprapatellar and posterior spaces. 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 The cortex appears intact. No displaced fractures identified.  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 No change since previous study. No acute displaced fractures.   Electronically Signed   By: Lucienne Capers M.D.   On: 09/24/2013 21:23   Dg Knee Complete 4 Views Right  09/24/2013   CLINICAL DATA:  Pain post trauma  EXAM: RIGHT KNEE - COMPLETE 4+ VIEW  COMPARISON:  March 24, 2013.  FINDINGS: Frontal, lateral, and bilateral oblique views were obtained. There is no fracture or dislocation. There are multiple calcifications in the suprapatellar bursa. A frank joint effusion is not seen at this time. There is advanced osteoarthritic change with narrowing in all compartments, most marked medially. There is spurring in all compartments.  IMPRESSION: Advanced osteoarthritis, most marked medially. 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 No acute fracture or dislocation.   Electronically Signed   By: Lowella Grip M.D.   On: 09/24/2013 21:24    Assessment/plan:  OSA/OHS s/p trach with nocturnal vent. Chronic hypoxic, hypercapnic respiratory failure. Plan: Trach care Will arrange for vent at night F/u CXR as needed Oxygen to keep SpO2 > 90%  Weakness. Plan: Per primary team  Hx of chronic diastolic dysfunction. Plan: Blood pressure control per primary team  Hx of recurrent PE's. Plan: Continue coumadin  Morbid obesity. Plan: Diet per primary team  Summary: PCCM will monitor intermittently while he is in hospital to assist with trach and nocturnal vent management.  Chesley Mires, MD [facility] Pulmonary/Critical Care 09/24/2013,  10:40 PM Pager:  [phone] After 3pm call: [phone]

## 2013-09-24 NOTE — ED Notes (Signed)
MD at bedside. 

## 2013-09-24 NOTE — ED Notes (Signed)
Patient transported to CT; MD went in to talk to family per their request.

## 2013-09-24 NOTE — ED Notes (Addendum)
Pt. Unable to stand with assist of 3 and walker.  Pt. Very unstable on his legs and gait is very unstable.   Pt. Is unable to walk due unsteady gait.

## 2013-09-24 NOTE — ED Notes (Signed)
Pt. [place] yesterday denies any injuries.  Pt. Was unable to get out of bed today due to being sore.  PT. Has arthritis and chronic bilateral knee pain. Pt. Denies any LOC or hitting his head.

## 2013-09-24 NOTE — ED Notes (Signed)
Pt. Reports that his knees give out and he falls.  He denies hitting his head .  Pt. Is unable to get up when he falls.  He has to call [facility] to come and get him up.

## 2013-09-24 NOTE — ED Notes (Signed)
Pt finished dinner; RT called for suctioning per pt request.

## 2013-09-24 NOTE — ED Notes (Signed)
SW to talk with EDP.

## 2013-09-24 NOTE — Telephone Encounter (Signed)
Unable to reach pt. Will try again at a later time.  

## 2013-09-24 NOTE — Progress Notes (Signed)
Patient suctioned and lavaged per patient request.  RT will continue to monitor.  Post sxn SPO2- 97%

## 2013-09-24 NOTE — H&P (Signed)
PCP:   Renato Shin, MD    Chief Complaint:  Falls at home  HPI: Travis Chapman is a 68 y.o. 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   Presented with  Patient has complex hx of obesity hypoventilation syndrome with chronic respiratory failure tracheostomy dependent and ventilator dependent at night. Patient was recently admit due to debility he was discharged to [facility] for 30 days and 2 weeks ago got discharged to home with home health. He was getting home PT/OT . Yesterday he was walking down the hall and felt like his legs buckled and he fell down. Denies hitting his head. He have had one more fall under similar circumstances. This AM he was unable to get up. Patient presented to emergency department stating his family is unable to take care of him due to his severe disability. They do not have a hospital bed or lift. Social work consult has been placed the plan for patient to be placed back to [facility] if possible.   Hospitalist was called for admission for Debility   Review of Systems:    Pertinent positives include:  Knee pain  Constitutional:  No weight loss, night sweats, Fevers, chills, fatigue, weight loss  HEENT:  No headaches, Difficulty swallowing,Tooth/dental problems,Sore throat,  No sneezing, itching, ear ache, nasal congestion, post nasal drip,  Cardio-vascular:  No chest pain,  Orthopnea, PND, anasarca, dizziness, palpitations.no Bilateral lower extremity swelling  GI:  No heartburn, indigestion, abdominal pain, nausea, vomiting, diarrhea, change in bowel habits, loss of appetite, melena, blood in stool, hematemesis Resp:  no shortness of breath at rest. No dyspnea on exertion, No excess mucus, no productive cough, No non-productive cough, No coughing up of blood.No change in color of mucus.No wheezing. Skin:  no rash or lesions. No jaundice GU:  no dysuria, change in color of urine, no urgency or frequency. No straining to urinate.  No flank pain.  Musculoskeletal:  No joint pain or no joint swelling. No decreased range of motion. No back pain.  Psych:  No change in mood or affect. No depression or anxiety. No memory loss.  Neuro: no localizing neurological complaints, no tingling, no weakness, no double vision, no gait abnormality, no slurred speech, no confusion  Otherwise ROS are negative except for above, 10 systems were reviewed  Past Medical History: Past Medical History  Diagnosis Date  . ANXIETY 07/23/2006  . ASTHMA 07/23/2006  . DM 05/23/2007  . HYPERTENSION 07/23/2006  . OSTEOARTHRITIS 07/23/2006  . HYPERCHOLESTEROLEMIA 05/23/2007  . ANEMIA 08/31/2008  . Palpitations 02/10/2008  . ALCOHOL ABUSE, IN REMISSION, HX OF 08/26/2008  . TOBACCO ABUSE, HX OF 08/26/2008  . Pulmonary embolism 03/16/2010  . ED (erectile dysfunction)   . Morbid obesity   . DM retinopathy   . 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 DM neuropathy with neurologic complication   . CHF (congestive heart failure)     With a preserved EF  . 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 Sleep apnea   . Shortness of breath   . CARCINOMA, PROSTATE 09/29/2009   Past Surgical History  Procedure Laterality Date  . Back surgery    . Tracheostomy       Medications: Prior to Admission medications   Medication Sig  Start Date End Date Taking? Authorizing Provider  acetaminophen (TYLENOL) 650 MG CR tablet Take 1,300 mg by mouth every 8 (eight) hours as needed for pain.   Yes Historical Provider, MD  alprazolam Duanne Moron) 2 MG tablet Take 2 tablets (4 mg total) by mouth at bedtime. 08/11/13  Yes Cherene Altes, MD  amLODipine (NORVASC) 10 MG tablet Take 1 tablet (10 mg total) by mouth daily. 07/04/13  Yes Renato Shin, MD  carvedilol (COREG) 6.25 MG tablet Take 6.25 mg by mouth 2 (two) times daily with a meal.   Yes Historical Provider, MD  citalopram (CELEXA) 20 MG tablet Take 1 tablet (20 mg total) by mouth daily. 04/30/13  Yes Cherene Altes, MD  cloNIDine (CATAPRES) 0.1 MG tablet Take 0.1 mg by mouth 2 (two) times daily.  02/12/13  Yes Renato Shin, MD  furosemide (LASIX) 20 MG tablet Take 3 tablets (60 mg total) by mouth daily. 07/07/13  Yes Janece Canterbury, MD  insulin glargine (LANTUS) 100 UNIT/ML injection Inject 25 Units into the skin every morning.  01/13/13  Yes Renato Shin, MD  losartan (COZAAR) 100 MG tablet Take 0.5 tablets (50 mg total) by mouth daily. 08/11/13  Yes Cherene Altes, MD  metoCLOPramide (REGLAN) 5 MG tablet Take 5 mg by mouth 3 (three) times daily.   Yes Historical Provider, MD  Multiple Vitamin (MULTIVITAMIN) tablet Take 1 tablet by mouth daily.    Yes Historical Provider, MD  oxyCODONE 10 MG TABS Take 1 tablet (10 mg total) by mouth every 6 (six) hours as needed for severe pain. 08/11/13  Yes Cherene Altes, MD  polyethylene glycol [facility] / GLYCOLAX) packet Take 17 g by mouth daily. 08/11/13  Yes Cherene Altes, MD  potassium chloride SA (K-DUR,KLOR-CON) 20 MEQ tablet Take 1 tablet (20 mEq total) by mouth daily. 08/11/13  Yes Cherene Altes, MD  senna-docusate (SENOKOT-S) 8.6-50 MG per tablet Take 1 tablet by mouth 2 (two) times daily. 08/11/13  Yes Cherene Altes, MD  simvastatin (ZOCOR) 20 MG tablet Take 20 mg by mouth daily.   Yes Historical Provider, MD  warfarin  (COUMADIN) 5 MG tablet Take 1.5 tablets (7.5 mg total) by mouth daily. 08/02/13  Yes Donita Brooks, NP    Allergies:  No Known Allergies  Social History:  Ambulatory  walker  Prior to yesterday Lives at home alone With family     reports that he quit smoking about 8 years ago. His smoking use included Cigarettes. He has a 7.5 pack-year smoking history. He does not have any smokeless tobacco history on file. He reports that he does not drink alcohol or use illicit drugs.    Family History: family history includes CAD in his father; Cancer in his other; Diabetes Mellitus II in his father.    Physical Exam: Patient Vitals for the past 24 hrs:  BP Temp Temp src Pulse Resp SpO2 Height Weight  09/24/13 2136 - - - 69 22 96 % - -  09/24/13 1945 167/80 mmHg - - 70 17 98 % - -  09/24/13 1936 151/72 mmHg - - 68 11 97 % - -  09/24/13 1915 165/65 mmHg - - 69 15  95 % - -  09/24/13 1900 152/64 mmHg - - 69 18 96 % - -  09/24/13 1845 159/133 mmHg - - 72 16 91 % - -  09/24/13 1830 167/83 mmHg - - 75 17 91 % - -  09/24/13 1822 168/75 mmHg - - 64 16 94 % - -  09/24/13 1800 162/75 mmHg - - 65 12 97 % - -  09/24/13 1745 163/62 mmHg - - 66 13 96 % - -  09/24/13 1728 167/85 mmHg - - 75 18 97 % - -  09/24/13 1715 167/85 mmHg - - 75 12 96 % - -  09/24/13 1700 158/83 mmHg - - 60 16 96 % - -  09/24/13 1645 160/76 mmHg - - 65 14 96 % - -  09/24/13 1630 148/80 mmHg - - 72 20 96 % - -  09/24/13 1627 160/73 mmHg - - 67 20 97 % - -  09/24/13 1534 167/75 mmHg 98.2 F (36.8 C) Oral 63 20 98 % - -  09/24/13 1450 - - - 65 18 96 % - -  09/24/13 1410 142/70 mmHg 97 F (36.1 C) Oral 71 18 98 % 6\' 2"  (1.88 m) 163.295 kg (360 lb)    1. General:  in No Acute distress 2. Psychological: Alert and  Oriented 3. Head/ENT:   Moist  Mucous Membranes                          Head Non traumatic, neck supple                          Normal   Dentition 4. SKIN: normal  Skin turgor,  Skin clean Dry chronic venostasis  changes over both lower extremities  5. Heart: Regular rate and rhythm no Murmur, Rub or gallop 6. Lungs: Clear to auscultation bilaterally, no wheezes or crackles   7. Abdomen: Soft, non-tender, Non distended, obese 8. Lower extremities: no clubbing, cyanosis, or edema chronic venous stasis change 9. Neurologically Grossly intact, moving all 4 extremities equally 10. MSK: Normal range of motion  body mass index is 46.2 kg/(m^2).   Labs on Admission:   Recent Labs  09/24/13 1701  NA 141  K 4.2  CL 103  CO2 26  GLUCOSE 115*  BUN 17  CREATININE 1.40*  CALCIUM 9.2    Recent Labs  09/24/13 1701  AST 16  ALT 10  ALKPHOS 78  BILITOT 0.3  PROT 7.3  ALBUMIN 3.3*   No results found for this basename: LIPASE, AMYLASE,  in the last 72 hours  Recent Labs  09/24/13 1701  WBC 6.4  HGB 11.6*  HCT 36.0*  MCV 85.7  PLT 182   No results found for this basename: CKTOTAL, CKMB, CKMBINDEX, TROPONINI,  in the last 72 hours No results found for this basename: TSH, T4TOTAL, FREET3, T3FREE, THYROIDAB,  in the last 72 hours No results found for this basename: VITAMINB12, FOLATE, FERRITIN, TIBC, IRON, RETICCTPCT,  in the last 72 hours Lab Results  Component Value Date   HGBA1C 6.8* 08/05/2013    Estimated Creatinine Clearance: 83 ml/min (by C-G formula based on Cr of 1.4). ABG    Component Value Date/Time   PHART 7.327* 08/03/2013 0920   HCO3 32.3* 08/03/2013 0920   TCO2 34 08/03/2013 0920   O2SAT 98.0 08/03/2013 0920     Lab Results  Component Value Date   DDIMER 0.33  03/10/2013     Other results:  I have pearsonaly reviewed this: ECG REPORT  Rate: 72  Rhythm: LBBB  UA unremarkable  BNP (last 3 results)  Recent Labs  08/01/13 0154 08/03/13 1600 09/24/13 1701  PROBNP 265.8* 844.9* 7.4    Filed Weights   09/24/13 1410  Weight: 163.295 kg (360 lb)     Cultures:    Component Value Date/Time   SDES URINE, RANDOM 04/26/2013 1958   SPECREQUEST NONE 04/26/2013  1958   CULT  Value: Multiple bacterial morphotypes present, none predominant. Suggest appropriate recollection if clinically indicated. Performed at [facility] 04/26/2013 1958   REPTSTATUS 04/28/2013 FINAL 04/26/2013 1958        Radiological Exams on Admission: Dg Chest 2 View  09/24/2013   CLINICAL DATA:  Fall.  Hypertension.  EXAM: CHEST  2 VIEW  COMPARISON:  08/03/2013.  FINDINGS: Tracheostomy in stable position. Stable cardiomegaly and pulmonary vascular congestion. No focal infiltrates identified. Costophrenic angles not imaged. No pleural effusion however noted. No pneumothorax. No acute bony abnormality.  IMPRESSION: 1. Tracheostomy in stable position. 2. Stable cardiomegaly and pulmonary venous congestion.   Electronically Signed   By: Marcello Moores  Register   On: 09/24/2013 16:14   Dg Pelvis 1-2 Views  09/24/2013   CLINICAL DATA:  Bilateral hip pain after a fall.  EXAM: PELVIS - 1-2 VIEW  COMPARISON:  CT pelvis 12/29/2008  FINDINGS: Technically limited study due to patient's body habitus resulting in underpenetration of the image. There is no evidence of pelvic fracture or diastasis. No other pelvic bone lesions are seen. Surgical clips in the low pelvis.  IMPRESSION: No displaced fractures identified.   Electronically Signed   By: Lucienne Capers M.D.   On: 09/24/2013 21:21   Ct Head Wo Contrast  09/24/2013   CLINICAL DATA:  Fall.  EXAM: CT HEAD WITHOUT CONTRAST  TECHNIQUE: Contiguous axial images were obtained from the base of the skull through the vertex without intravenous contrast.  COMPARISON:  None.  FINDINGS: Prominence of the sulci and ventricles noted compatible with brain atrophy. There is mild low attenuation within the subcortical and periventricular white matter compatible with chronic microvascular disease. There is no evidence for acute brain infarct, intracranial hemorrhage or mass. The paranasal sinuses and the mastoid air cells are clear. The skull is intact.  IMPRESSION: No  acute intracranial abnormalities.  Small vessel ischemic disease and atrophy.   Electronically Signed   By: Kerby Moors M.D.   On: 09/24/2013 20:06   Dg Knee Complete 4 Views Left  09/24/2013   CLINICAL DATA:  Bilateral knee pain after a fall.  EXAM: LEFT KNEE - COMPLETE 4+ VIEW  COMPARISON:  03/24/2013  FINDINGS: Prominent degenerative changes in the left knee involving all 3 compartments. Medial compartment narrowing and hypertrophic changes predominate. Chondrocalcinosis is present. Heterotopic calcification throughout the suprapatellar and posterior spaces. 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 The cortex appears intact. No displaced fractures identified.  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 No change since previous study. No acute displaced fractures.   Electronically Signed   By: Lucienne Capers M.D.   On: 09/24/2013 21:23   Dg Knee Complete 4 Views Right  09/24/2013   CLINICAL DATA:  Pain post trauma  EXAM: RIGHT KNEE - COMPLETE 4+ VIEW  COMPARISON:  March 24, 2013.  FINDINGS: Frontal, lateral, and bilateral oblique views were obtained. There is no fracture or dislocation. There are multiple calcifications  in the suprapatellar bursa. A frank joint effusion is not seen at this time. There is advanced osteoarthritic change with narrowing in all compartments, most marked medially. There is spurring in all compartments.  IMPRESSION: Advanced osteoarthritis, most marked medially. 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 No acute fracture or dislocation.   Electronically Signed   By: Lowella Grip M.D.   On: 09/24/2013 21:24    Chart has been reviewed  Assessment/Plan  68 year old gentleman history of diabetes, type or ventilation syndrome secondary to morbid obesity trach and nocturnal vent dependent here with debility  Present on  Admission:  . Physical deconditioning - after a full patient was no longer able to get up. Will need extra assistance and rehabilitation. We'll write for PT consult  . Type I (juvenile type) diabetes mellitus with renal manifestations, not stated as uncontrolled(250.41) - sliding scale continue Lantus  . Obesity hypoventilation syndrome with Chronic respiratory failure- spoke to [facility] M. hold we'll assist with ventilator orders  . HYPERTENSION -continue home medications   history of CHF currently stable continue Lasix Remote history of PE but but given hypomobility patient continues to be at risk. Coumadin subtherapeutic will cover with Lovenox and dose Coumadin per pharmacy  Prophylaxis:   Lovenox, coumadin Protonix  CODE STATUS:    DNR/DNI  Other plan as per orders.  I have spent a total of 65 min on this admission extra time was taken to discuss care with [place] 09/24/2013, 9:48 PM  [facility]  Pager [phone]   If 7AM-7PM, please contact the day team taking care of the patient  [email or web address]  Password [email or web address]

## 2013-09-24 NOTE — ED Notes (Signed)
Pt requesting suction, respiratory at bedside.

## 2013-09-24 NOTE — ED Notes (Signed)
cbg 121 

## 2013-09-24 NOTE — Progress Notes (Signed)
RT suctioned patient with no complications. Vital signs stable at this time. RT will continue to monitor.

## 2013-09-24 NOTE — Progress Notes (Signed)
Patient was suctioned per request by patient.  SPO2 of 97% after sxn.  RT will continue to monitor.

## 2013-09-24 NOTE — ED Notes (Addendum)
Called Resp. Tech.  Pt. Asked if they could suction his trach. Rise Paganini called and spoke with RT

## 2013-09-24 NOTE — ED Provider Notes (Signed)
CSN: [id number]     Arrival date & time 09/24/13  1400 History   First MD Initiated Contact with Patient 09/24/13 1506     Chief Complaint  Patient presents with  . Fall     (Consider location/radiation/quality/duration/timing/severity/associated sxs/prior Treatment) Patient is a 68 y.o. male presenting with fall. The history is provided by the patient.  Fall This is a recurrent problem. The current episode started yesterday. Episode frequency: twice. The problem has been resolved. Pertinent negatives include no chest pain, no abdominal pain and no shortness of breath. Nothing aggravates the symptoms. Nothing relieves the symptoms.    Past Medical History  Diagnosis Date  . ANXIETY 07/23/2006  . ASTHMA 07/23/2006  . DM 05/23/2007  . HYPERTENSION 07/23/2006  . OSTEOARTHRITIS 07/23/2006  . HYPERCHOLESTEROLEMIA 05/23/2007  . ANEMIA 08/31/2008  . Palpitations 02/10/2008  . ALCOHOL ABUSE, IN REMISSION, HX OF 08/26/2008  . TOBACCO ABUSE, HX OF 08/26/2008  . Pulmonary embolism 03/16/2010  . ED (erectile dysfunction)   . Morbid obesity   . DM retinopathy   . 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 DM neuropathy with neurologic complication   . CHF (congestive heart failure)     With a preserved EF  . 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 Sleep apnea   . Shortness of breath   . CARCINOMA, PROSTATE 09/29/2009   Past Surgical History  Procedure Laterality Date  . Back surgery    . Tracheostomy     Family History  Problem Relation Age of Onset  . Cancer Neg Hx    History  Substance Use Topics  . Smoking status: Former Smoker -- 0.50 packs/day for 15 years    Types: Cigarettes    Quit date: 01/16/2005  . Smokeless tobacco: Not on file  . Alcohol Use: No    Review of Systems  Constitutional: Negative for fever and chills.  Respiratory: Negative for cough, choking, chest tightness and shortness of breath.    Cardiovascular: Negative for chest pain and leg swelling.  Gastrointestinal: Negative for nausea, vomiting, abdominal pain, diarrhea, blood in stool and anal bleeding.  Genitourinary: Negative for dysuria, decreased urine volume and difficulty urinating.  Neurological: Positive for weakness. Negative for dizziness.  All other systems reviewed and are negative.     Allergies  Review of patient's allergies indicates no known allergies.  Home Medications   Prior to Admission medications   Medication Sig Start Date End Date Taking? Authorizing Provider  acetaminophen (TYLENOL) 650 MG CR tablet Take 1,300 mg by mouth every 8 (eight) hours as needed for pain.   Yes Historical Provider, MD  alprazolam Duanne Moron) 2 MG tablet Take 2 tablets (4 mg total) by mouth at bedtime. 08/11/13  Yes Cherene Altes, MD  amLODipine (NORVASC) 10 MG tablet Take 1 tablet (10 mg total) by mouth daily. 07/04/13  Yes Renato Shin, MD  carvedilol (COREG) 6.25 MG tablet Take 6.25 mg by mouth 2 (two) times daily with a meal.   Yes Historical Provider, MD  citalopram (CELEXA) 20 MG tablet Take 1 tablet (20 mg total) by mouth daily. 04/30/13  Yes Cherene Altes, MD  cloNIDine (CATAPRES) 0.1 MG tablet Take 0.1 mg by mouth 2 (two) times daily.  02/12/13  Yes Renato Shin, MD  furosemide (LASIX) 20 MG tablet Take 3 tablets (60 mg total) by mouth daily. 07/07/13  Yes Noah Delaine  Short, MD  insulin glargine (LANTUS) 100 UNIT/ML injection Inject 25 Units into the skin every morning.  01/13/13  Yes Renato Shin, MD  losartan (COZAAR) 100 MG tablet Take 0.5 tablets (50 mg total) by mouth daily. 08/11/13  Yes Cherene Altes, MD  metoCLOPramide (REGLAN) 5 MG tablet Take 5 mg by mouth 3 (three) times daily.   Yes Historical Provider, MD  Multiple Vitamin (MULTIVITAMIN) tablet Take 1 tablet by mouth daily.    Yes Historical Provider, MD  oxyCODONE 10 MG TABS Take 1 tablet (10 mg total) by mouth every 6 (six) hours as needed for severe  pain. 08/11/13  Yes Cherene Altes, MD  polyethylene glycol [facility] / GLYCOLAX) packet Take 17 g by mouth daily. 08/11/13  Yes Cherene Altes, MD  potassium chloride SA (K-DUR,KLOR-CON) 20 MEQ tablet Take 1 tablet (20 mEq total) by mouth daily. 08/11/13  Yes Cherene Altes, MD  senna-docusate (SENOKOT-S) 8.6-50 MG per tablet Take 1 tablet by mouth 2 (two) times daily. 08/11/13  Yes Cherene Altes, MD  simvastatin (ZOCOR) 20 MG tablet Take 20 mg by mouth daily.   Yes Historical Provider, MD  warfarin (COUMADIN) 5 MG tablet Take 1.5 tablets (7.5 mg total) by mouth daily. 08/02/13  Yes Donita Brooks, NP   BP 167/75  Pulse 63  Temp(Src) 98.2 F (36.8 C) (Oral)  Resp 20  Ht 6\' 2"  (1.88 m)  Wt 360 lb (163.295 kg)  BMI 46.20 kg/m2  SpO2 98% Physical Exam  Nursing note and vitals reviewed. Constitutional: He is oriented to person, place, and time. He appears well-developed and well-nourished. No distress.  HENT:  Head: Normocephalic and atraumatic.  Mouth/Throat: Oropharynx is clear and moist. No oropharyngeal exudate.  Eyes: EOM are normal. Pupils are equal, round, and reactive to light.  Neck: Normal range of motion. Neck supple.  Cardiovascular: Normal rate and regular rhythm.  Exam reveals no friction rub.   No murmur heard. Pulmonary/Chest: Effort normal and breath sounds normal. No respiratory distress. He has no wheezes. He has no rales.  Abdominal: Soft. He exhibits no distension. There is no tenderness. There is no rebound.  Musculoskeletal: Normal range of motion. He exhibits no edema.  Neurological: He is alert and oriented to person, place, and time. No cranial nerve deficit. He exhibits normal muscle tone. Coordination normal.  Skin: He is not diaphoretic.    ED Course  Procedures (including critical care time) Labs Review Labs Reviewed  CBG MONITORING, ED - Abnormal; Notable for the following:    Glucose-Capillary 121 (*)    All other components within normal  limits  CBC  COMPREHENSIVE METABOLIC PANEL  URINALYSIS, ROUTINE W REFLEX MICROSCOPIC  I-STAT CG4 LACTIC ACID, ED    Imaging Review Dg Chest 2 View  09/24/2013   CLINICAL DATA:  Fall.  Hypertension.  EXAM: CHEST  2 VIEW  COMPARISON:  08/03/2013.  FINDINGS: Tracheostomy in stable position. Stable cardiomegaly and pulmonary vascular congestion. No focal infiltrates identified. Costophrenic angles not imaged. No pleural effusion however noted. No pneumothorax. No acute bony abnormality.  IMPRESSION: 1. Tracheostomy in stable position. 2. Stable cardiomegaly and pulmonary venous congestion.   Electronically Signed   By: Marcello Moores  Register   On: 09/24/2013 16:14   Dg Pelvis 1-2 Views  09/24/2013   CLINICAL DATA:  Bilateral hip pain after a fall.  EXAM: PELVIS - 1-2 VIEW  COMPARISON:  CT pelvis 12/29/2008  FINDINGS: Technically limited study due to patient's body habitus resulting  in underpenetration of the image. There is no evidence of pelvic fracture or diastasis. No other pelvic bone lesions are seen. Surgical clips in the low pelvis.  IMPRESSION: No displaced fractures identified.   Electronically Signed   By: Lucienne Capers M.D.   On: 09/24/2013 21:21   Ct Head Wo Contrast  09/24/2013   CLINICAL DATA:  Fall.  EXAM: CT HEAD WITHOUT CONTRAST  TECHNIQUE: Contiguous axial images were obtained from the base of the skull through the vertex without intravenous contrast.  COMPARISON:  None.  FINDINGS: Prominence of the sulci and ventricles noted compatible with brain atrophy. There is mild low attenuation within the subcortical and periventricular white matter compatible with chronic microvascular disease. There is no evidence for acute brain infarct, intracranial hemorrhage or mass. The paranasal sinuses and the mastoid air cells are clear. The skull is intact.  IMPRESSION: No acute intracranial abnormalities.  Small vessel ischemic disease and atrophy.   Electronically Signed   By: Kerby Moors M.D.   On:  09/24/2013 20:06   Dg Knee Complete 4 Views Left  09/24/2013   CLINICAL DATA:  Bilateral knee pain after a fall.  EXAM: LEFT KNEE - COMPLETE 4+ VIEW  COMPARISON:  03/24/2013  FINDINGS: Prominent degenerative changes in the left knee involving all 3 compartments. Medial compartment narrowing and hypertrophic changes predominate. Chondrocalcinosis is present. Heterotopic calcification throughout the suprapatellar and posterior spaces. 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 The cortex appears intact. No displaced fractures identified.  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 No change since previous study. No acute displaced fractures.   Electronically Signed   By: Lucienne Capers M.D.   On: 09/24/2013 21:23   Dg Knee Complete 4 Views Right  09/24/2013   CLINICAL DATA:  Pain post trauma  EXAM: RIGHT KNEE - COMPLETE 4+ VIEW  COMPARISON:  March 24, 2013.  FINDINGS: Frontal, lateral, and bilateral oblique views were obtained. There is no fracture or dislocation. There are multiple calcifications in the suprapatellar bursa. A frank joint effusion is not seen at this time. There is advanced osteoarthritic change with narrowing in all compartments, most marked medially. There is spurring in all compartments.  IMPRESSION: Advanced osteoarthritis, most marked medially. 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 No acute fracture or dislocation.   Electronically Signed   By: Lowella Grip M.D.   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 Hx of same. Had previously been admitted for placement due to frequent falls and altered metal status. He said that he did well and is stiff and was doing well until yesterday when he has fallen twice. No pulse they. No injuries. Not his head or lose consciousness. He stated he was walking along and his knees gave out. He denies any chest pain, shortness of breath, dizziness, nausea, vomiting, diarrhea. He has been eating  normally. Denies any difficulty breathing or worsening secretions from his trach. Denies any rectal bleeding. Here vitals are stable. Exam is benign. Will check basic labs and see if he has any abnormalities that could be causing his weakness. On exam he has good strength in both legs with normal sensation. Labs ok. Unable to stand with assistance. Case Management evaluated patient - stated he was sent home with [facility] from [facility], so we cannot provide that for him since he already has it. Mariann Laster with [facility] spoke with Social Work who recommended formal eval in the morning. Dr. Roel Cluck admitting.  Evelina Bucy, MD 09/24/13 [phone]

## 2013-09-24 NOTE — Care Management (Addendum)
ED CM consulted by Dr. Mingo Amber EDP concerning patient. Patient presneted to [facility] ED c/o generalized weakness s/p fall at home. Patient has [employer] coverage. Patient is on vent 12 hours daily, and  recently discharged from [place] with [facility] PT/OT services with [place]. Patient is MO and lives alone. Spoke with patient and daughter at bedside after providing verbal permission to discuss care in the presence of daughter.  Daughter reports patient is unable to ambulate which is a change from last week. Patient and family interested in placement. Patient was at [facility] for a  Month. Pt. Unable to stand with assist of 3 and walker. Pt. Very unstable on his legs and gait is very unstable. ED evaluation still pending. Notified [facility] ED CSW concerning  a safe disposition plan.  SW will continue to follow patient.

## 2013-09-25 DIAGNOSIS — Z86711 Personal history of pulmonary embolism: Secondary | ICD-10-CM

## 2013-09-25 DIAGNOSIS — Z9911 Dependence on respirator [ventilator] status: Secondary | ICD-10-CM | POA: Diagnosis not present

## 2013-09-25 DIAGNOSIS — E1039 Type 1 diabetes mellitus with other diabetic ophthalmic complication: Secondary | ICD-10-CM | POA: Diagnosis present

## 2013-09-25 DIAGNOSIS — F1011 Alcohol abuse, in remission: Secondary | ICD-10-CM | POA: Diagnosis present

## 2013-09-25 DIAGNOSIS — Z794 Long term (current) use of insulin: Secondary | ICD-10-CM | POA: Diagnosis not present

## 2013-09-25 DIAGNOSIS — M1710 Unilateral primary osteoarthritis, unspecified knee: Secondary | ICD-10-CM | POA: Diagnosis present

## 2013-09-25 DIAGNOSIS — N058 Unspecified nephritic syndrome with other morphologic changes: Secondary | ICD-10-CM | POA: Diagnosis present

## 2013-09-25 DIAGNOSIS — R262 Difficulty in walking, not elsewhere classified: Secondary | ICD-10-CM

## 2013-09-25 DIAGNOSIS — I509 Heart failure, unspecified: Secondary | ICD-10-CM | POA: Diagnosis present

## 2013-09-25 DIAGNOSIS — J9610 Chronic respiratory failure, unspecified whether with hypoxia or hypercapnia: Secondary | ICD-10-CM | POA: Diagnosis present

## 2013-09-25 DIAGNOSIS — N183 Chronic kidney disease, stage 3 unspecified: Secondary | ICD-10-CM | POA: Diagnosis present

## 2013-09-25 DIAGNOSIS — Y92009 Unspecified place in unspecified non-institutional (private) residence as the place of occurrence of the external cause: Secondary | ICD-10-CM | POA: Insufficient documentation

## 2013-09-25 DIAGNOSIS — E662 Morbid (severe) obesity with alveolar hypoventilation: Secondary | ICD-10-CM | POA: Diagnosis present

## 2013-09-25 DIAGNOSIS — Z87891 Personal history of nicotine dependence: Secondary | ICD-10-CM | POA: Diagnosis not present

## 2013-09-25 DIAGNOSIS — W19XXXA Unspecified fall, initial encounter: Secondary | ICD-10-CM | POA: Insufficient documentation

## 2013-09-25 DIAGNOSIS — I5032 Chronic diastolic (congestive) heart failure: Secondary | ICD-10-CM | POA: Diagnosis present

## 2013-09-25 DIAGNOSIS — R5381 Other malaise: Secondary | ICD-10-CM | POA: Diagnosis present

## 2013-09-25 DIAGNOSIS — E11319 Type 2 diabetes mellitus with unspecified diabetic retinopathy without macular edema: Secondary | ICD-10-CM | POA: Diagnosis present

## 2013-09-25 DIAGNOSIS — Z930 Tracheostomy status: Secondary | ICD-10-CM | POA: Diagnosis not present

## 2013-09-25 DIAGNOSIS — I10 Essential (primary) hypertension: Secondary | ICD-10-CM

## 2013-09-25 DIAGNOSIS — D649 Anemia, unspecified: Secondary | ICD-10-CM | POA: Diagnosis present

## 2013-09-25 DIAGNOSIS — E785 Hyperlipidemia, unspecified: Secondary | ICD-10-CM

## 2013-09-25 DIAGNOSIS — Z6841 Body Mass Index (BMI) 40.0 and over, adult: Secondary | ICD-10-CM | POA: Diagnosis not present

## 2013-09-25 DIAGNOSIS — E1049 Type 1 diabetes mellitus with other diabetic neurological complication: Secondary | ICD-10-CM | POA: Diagnosis present

## 2013-09-25 DIAGNOSIS — G4733 Obstructive sleep apnea (adult) (pediatric): Secondary | ICD-10-CM | POA: Diagnosis present

## 2013-09-25 DIAGNOSIS — Z66 Do not resuscitate: Secondary | ICD-10-CM | POA: Diagnosis present

## 2013-09-25 DIAGNOSIS — E1029 Type 1 diabetes mellitus with other diabetic kidney complication: Secondary | ICD-10-CM | POA: Diagnosis present

## 2013-09-25 DIAGNOSIS — I129 Hypertensive chronic kidney disease with stage 1 through stage 4 chronic kidney disease, or unspecified chronic kidney disease: Secondary | ICD-10-CM | POA: Diagnosis present

## 2013-09-25 LAB — COMPREHENSIVE METABOLIC PANEL
ALT: 8 U/L (ref 0–53)
AST: 12 U/L (ref 0–37)
Albumin: 2.8 g/dL — ABNORMAL LOW (ref 3.5–5.2)
Alkaline Phosphatase: 68 U/L (ref 39–117)
Anion gap: 9 (ref 5–15)
BILIRUBIN TOTAL: 0.3 mg/dL (ref 0.3–1.2)
BUN: 16 mg/dL (ref 6–23)
CO2: 27 meq/L (ref 19–32)
CREATININE: 1.39 mg/dL — AB (ref 0.50–1.35)
Calcium: 8.5 mg/dL (ref 8.4–10.5)
Chloride: 103 mEq/L (ref 96–112)
GFR calc Af Amer: 59 mL/min — ABNORMAL LOW (ref >90)
GFR, EST NON AFRICAN AMERICAN: 51 mL/min — AB (ref >90)
GLUCOSE: 182 mg/dL — AB (ref 70–99)
Potassium: 3.7 mEq/L (ref 3.7–5.3)
Sodium: 139 mEq/L (ref 137–147)
Total Protein: 6.4 g/dL (ref 6.0–8.3)

## 2013-09-25 LAB — HEMOGLOBIN A1C
Hgb A1c MFr Bld: 7.3 % — ABNORMAL HIGH (ref <5.7)
MEAN PLASMA GLUCOSE: 163 mg/dL — AB (ref <117)

## 2013-09-25 LAB — TSH: TSH: 0.98 u[IU]/mL (ref 0.350–4.500)

## 2013-09-25 LAB — GLUCOSE, CAPILLARY
GLUCOSE-CAPILLARY: 124 mg/dL — AB (ref 70–99)
Glucose-Capillary: 131 mg/dL — ABNORMAL HIGH (ref 70–99)
Glucose-Capillary: 161 mg/dL — ABNORMAL HIGH (ref 70–99)
Glucose-Capillary: 213 mg/dL — ABNORMAL HIGH (ref 70–99)
Glucose-Capillary: 171 mg/dL — ABNORMAL HIGH (ref 70–99)

## 2013-09-25 LAB — CBC
HCT: 31.9 % — ABNORMAL LOW (ref 39.0–52.0)
Hemoglobin: 10.3 g/dL — ABNORMAL LOW (ref 13.0–17.0)
MCH: 27.2 pg (ref 26.0–34.0)
MCHC: 32.3 g/dL (ref 30.0–36.0)
MCV: 84.4 fL (ref 78.0–100.0)
Platelets: 183 10*3/uL (ref 150–400)
RBC: 3.78 MIL/uL — ABNORMAL LOW (ref 4.22–5.81)
RDW: 13 % (ref 11.5–15.5)
WBC: 5.1 10*3/uL (ref 4.0–10.5)

## 2013-09-25 LAB — PHOSPHORUS: Phosphorus: 2.4 mg/dL (ref 2.3–4.6)

## 2013-09-25 LAB — MRSA PCR SCREENING: MRSA by PCR: NEGATIVE

## 2013-09-25 LAB — MAGNESIUM: Magnesium: 2.1 mg/dL (ref 1.5–2.5)

## 2013-09-25 MED ORDER — POLYETHYLENE GLYCOL 3350 17 G PO PACK
17.0000 g | PACK | Freq: Every day | ORAL | Status: DC
  Filled 2013-09-25 (×5): qty 1

## 2013-09-25 MED ORDER — ALPRAZOLAM 0.5 MG PO TABS
4.0000 mg | ORAL_TABLET | Freq: Every day | ORAL | Status: DC
  Administered 2013-09-25 – 2013-09-28 (×5): 4 mg via ORAL
  Filled 2013-09-25 (×5): qty 8

## 2013-09-25 MED ORDER — ACETAMINOPHEN 650 MG RE SUPP: 650.0000 mg | Freq: Four times a day (QID) | RECTAL | Status: DC | PRN

## 2013-09-25 MED ORDER — SENNOSIDES-DOCUSATE SODIUM 8.6-50 MG PO TABS
1.0000 | ORAL_TABLET | Freq: Two times a day (BID) | ORAL | Status: DC
  Administered 2013-09-25 – 2013-09-28 (×4): 1 via ORAL
  Filled 2013-09-25 (×10): qty 1

## 2013-09-25 MED ORDER — POTASSIUM CHLORIDE CRYS ER 20 MEQ PO TBCR
20.0000 meq | EXTENDED_RELEASE_TABLET | Freq: Every day | ORAL | Status: DC
  Administered 2013-09-25 – 2013-09-29 (×5): 20 meq via ORAL
  Filled 2013-09-25 (×5): qty 1

## 2013-09-25 MED ORDER — FUROSEMIDE 40 MG PO TABS
60.0000 mg | ORAL_TABLET | Freq: Every day | ORAL | Status: DC
  Administered 2013-09-25 – 2013-09-29 (×5): 60 mg via ORAL
  Filled 2013-09-25 (×5): qty 1

## 2013-09-25 MED ORDER — WARFARIN - PHARMACIST DOSING INPATIENT
Freq: Every day | Status: DC
  Administered 2013-09-25: 18:00:00
  Administered 2013-09-26 – 2013-09-29 (×2): 1

## 2013-09-25 MED ORDER — AMLODIPINE BESYLATE 10 MG PO TABS
10.0000 mg | ORAL_TABLET | Freq: Every day | ORAL | Status: DC
  Administered 2013-09-25 – 2013-09-29 (×5): 10 mg via ORAL
  Filled 2013-09-25 (×5): qty 1

## 2013-09-25 MED ORDER — CLONIDINE HCL 0.1 MG PO TABS
0.1000 mg | ORAL_TABLET | Freq: Two times a day (BID) | ORAL | Status: DC
  Administered 2013-09-25 – 2013-09-29 (×10): 0.1 mg via ORAL
  Filled 2013-09-25 (×11): qty 1

## 2013-09-25 MED ORDER — INSULIN ASPART 100 UNIT/ML ~~LOC~~ SOLN
0.0000 [IU] | Freq: Every day | SUBCUTANEOUS | Status: DC
  Administered 2013-09-28: 2 [IU] via SUBCUTANEOUS

## 2013-09-25 MED ORDER — CARVEDILOL 6.25 MG PO TABS
6.2500 mg | ORAL_TABLET | Freq: Two times a day (BID) | ORAL | Status: DC
  Administered 2013-09-25 – 2013-09-29 (×10): 6.25 mg via ORAL
  Filled 2013-09-25 (×12): qty 1

## 2013-09-25 MED ORDER — SIMVASTATIN 20 MG PO TABS
20.0000 mg | ORAL_TABLET | Freq: Every day | ORAL | Status: DC
  Administered 2013-09-25 – 2013-09-29 (×5): 20 mg via ORAL
  Filled 2013-09-25 (×5): qty 1

## 2013-09-25 MED ORDER — ENOXAPARIN SODIUM 40 MG/0.4ML ~~LOC~~ SOLN
40.0000 mg | SUBCUTANEOUS | Status: DC
  Administered 2013-09-25 – 2013-09-26 (×2): 40 mg via SUBCUTANEOUS
  Filled 2013-09-25 (×2): qty 0.4

## 2013-09-25 MED ORDER — CITALOPRAM HYDROBROMIDE 20 MG PO TABS
20.0000 mg | ORAL_TABLET | Freq: Every day | ORAL | Status: DC
  Administered 2013-09-25 – 2013-09-29 (×5): 20 mg via ORAL
  Filled 2013-09-25 (×5): qty 1

## 2013-09-25 MED ORDER — HYDROCODONE-ACETAMINOPHEN 5-325 MG PO TABS
1.0000 | ORAL_TABLET | ORAL | Status: DC | PRN
  Administered 2013-09-25: 2 via ORAL
  Administered 2013-09-25: 1 via ORAL
  Administered 2013-09-25 – 2013-09-29 (×3): 2 via ORAL
  Filled 2013-09-25 (×3): qty 2
  Filled 2013-09-25: qty 1
  Filled 2013-09-25: qty 2

## 2013-09-25 MED ORDER — ONDANSETRON HCL 4 MG/2ML IJ SOLN: 4.0000 mg | Freq: Four times a day (QID) | INTRAMUSCULAR | Status: DC | PRN

## 2013-09-25 MED ORDER — DOCUSATE SODIUM 100 MG PO CAPS
100.0000 mg | ORAL_CAPSULE | Freq: Two times a day (BID) | ORAL | Status: DC
  Administered 2013-09-25 – 2013-09-29 (×5): 100 mg via ORAL
  Filled 2013-09-25 (×10): qty 1

## 2013-09-25 MED ORDER — LOSARTAN POTASSIUM 50 MG PO TABS
50.0000 mg | ORAL_TABLET | Freq: Every day | ORAL | Status: DC
  Administered 2013-09-25 – 2013-09-29 (×5): 50 mg via ORAL
  Filled 2013-09-25 (×5): qty 1

## 2013-09-25 MED ORDER — INSULIN ASPART 100 UNIT/ML ~~LOC~~ SOLN
0.0000 [IU] | Freq: Three times a day (TID) | SUBCUTANEOUS | Status: DC
  Administered 2013-09-25: 4 [IU] via SUBCUTANEOUS
  Administered 2013-09-25: 7 [IU] via SUBCUTANEOUS
  Administered 2013-09-25 – 2013-09-26 (×2): 4 [IU] via SUBCUTANEOUS
  Administered 2013-09-26: 7 [IU] via SUBCUTANEOUS
  Administered 2013-09-27: 4 [IU] via SUBCUTANEOUS
  Administered 2013-09-27: 7 [IU] via SUBCUTANEOUS
  Administered 2013-09-27: 3 [IU] via SUBCUTANEOUS
  Administered 2013-09-28 (×2): 4 [IU] via SUBCUTANEOUS
  Administered 2013-09-28: 3 [IU] via SUBCUTANEOUS
  Administered 2013-09-29: 7 [IU] via SUBCUTANEOUS
  Administered 2013-09-29: 3 [IU] via SUBCUTANEOUS

## 2013-09-25 MED ORDER — INSULIN GLARGINE 100 UNIT/ML ~~LOC~~ SOLN
25.0000 [IU] | Freq: Every morning | SUBCUTANEOUS | Status: DC
  Administered 2013-09-25 – 2013-09-26 (×2): 25 [IU] via SUBCUTANEOUS
  Filled 2013-09-25 (×4): qty 0.25

## 2013-09-25 MED ORDER — METOCLOPRAMIDE HCL 5 MG PO TABS
5.0000 mg | ORAL_TABLET | Freq: Three times a day (TID) | ORAL | Status: DC
  Administered 2013-09-25 – 2013-09-29 (×15): 5 mg via ORAL
  Filled 2013-09-25 (×17): qty 1

## 2013-09-25 MED ORDER — ONDANSETRON HCL 4 MG PO TABS: 4.0000 mg | ORAL_TABLET | Freq: Four times a day (QID) | ORAL | Status: DC | PRN

## 2013-09-25 MED ORDER — WARFARIN SODIUM 7.5 MG PO TABS
7.5000 mg | ORAL_TABLET | Freq: Every day | ORAL | Status: DC
Start: 2013-09-25 — End: 2013-09-27
  Administered 2013-09-25 – 2013-09-26 (×3): 7.5 mg via ORAL
  Filled 2013-09-25 (×4): qty 1

## 2013-09-25 MED ORDER — OXYCODONE HCL 5 MG PO TABS
10.0000 mg | ORAL_TABLET | Freq: Four times a day (QID) | ORAL | Status: DC | PRN
  Administered 2013-09-26 – 2013-09-29 (×7): 10 mg via ORAL
  Filled 2013-09-25 (×7): qty 2

## 2013-09-25 MED ORDER — ACETAMINOPHEN 325 MG PO TABS
650.0000 mg | ORAL_TABLET | Freq: Four times a day (QID) | ORAL | Status: DC | PRN
  Administered 2013-09-26 – 2013-09-28 (×2): 650 mg via ORAL
  Filled 2013-09-25 (×3): qty 2

## 2013-09-25 NOTE — Progress Notes (Signed)
Utilization review completed.  

## 2013-09-25 NOTE — Progress Notes (Signed)
ANTICOAGULATION CONSULT NOTE - Initial Consult  Pharmacy Consult for coumadin  Indication: hx PE  No Known Allergies  Patient Measurements: Height: 6\' 2"  (188 cm) Weight: 360 lb (163.295 kg) IBW/kg (Calculated) : 82.2 Heparin Dosing Weight:   Vital Signs: Temp: 98.9 F (37.2 C) (09/10 0110) Temp src: Oral (09/10 0110) BP: 149/75 mmHg (09/09 2337) Pulse Rate: 70 (09/09 2337)  Labs:  Recent Labs  09/24/13 1701  HGB 11.6*  HCT 36.0*  PLT 182  LABPROT 19.7*  INR 1.67*  CREATININE 1.40*    Estimated Creatinine Clearance: 83 ml/min (by C-G formula based on Cr of 1.4).   Medical History: Past Medical History  Diagnosis Date  . ANXIETY 07/23/2006  . ASTHMA 07/23/2006  . DM 05/23/2007  . HYPERTENSION 07/23/2006  . OSTEOARTHRITIS 07/23/2006  . HYPERCHOLESTEROLEMIA 05/23/2007  . ANEMIA 08/31/2008  . Palpitations 02/10/2008  . ALCOHOL ABUSE, IN REMISSION, HX OF 08/26/2008  . TOBACCO ABUSE, HX OF 08/26/2008  . Pulmonary embolism 03/16/2010  . ED (erectile dysfunction)   . Morbid obesity   . DM retinopathy   . 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 DM neuropathy with neurologic complication   . CHF (congestive heart failure)     With a preserved EF  . 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 Sleep apnea   . Shortness of breath   . CARCINOMA, PROSTATE 09/29/2009    Medications:  Prescriptions prior to admission  Medication Sig Dispense Refill  . acetaminophen (TYLENOL) 650 MG CR tablet Take 1,300 mg by mouth every 8 (eight) hours as needed for pain.      Marland Kitchen alprazolam (XANAX) 2 MG tablet Take 2 tablets (4 mg total) by mouth at bedtime.  60 tablet  0  . amLODipine (NORVASC) 10 MG tablet Take 1 tablet (10 mg total) by mouth daily.  30 tablet  1  . carvedilol (COREG) 6.25 MG tablet Take 6.25 mg by mouth 2 (two) times daily with a meal.      . citalopram (CELEXA) 20 MG tablet Take 1 tablet (20 mg total)  by mouth daily.  30 tablet  4  . cloNIDine (CATAPRES) 0.1 MG tablet Take 0.1 mg by mouth 2 (two) times daily.       . furosemide (LASIX) 20 MG tablet Take 3 tablets (60 mg total) by mouth daily.  180 tablet  0  . insulin glargine (LANTUS) 100 UNIT/ML injection Inject 25 Units into the skin every morning.       Marland Kitchen losartan (COZAAR) 100 MG tablet Take 0.5 tablets (50 mg total) by mouth daily.      . metoCLOPramide (REGLAN) 5 MG tablet Take 5 mg by mouth 3 (three) times daily.      . Multiple Vitamin (MULTIVITAMIN) tablet Take 1 tablet by mouth daily.       Marland Kitchen oxyCODONE 10 MG TABS Take 1 tablet (10 mg total) by mouth every 6 (six) hours as needed for severe pain.  30 tablet  0  . polyethylene glycol (MIRALAX / GLYCOLAX) packet Take 17 g by mouth daily.  14 each  0  . potassium chloride SA (K-DUR,KLOR-CON) 20 MEQ tablet Take 1 tablet (20 mEq total) by mouth daily.      Marland Kitchen senna-docusate (SENOKOT-S) 8.6-50 MG per tablet Take 1 tablet by mouth 2 (two) times daily.      . simvastatin (ZOCOR) 20  MG tablet Take 20 mg by mouth daily.      Marland Kitchen warfarin (COUMADIN) 5 MG tablet Take 1.5 tablets (7.5 mg total) by mouth daily.  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 No head involvement. Coumadin to continue due to hx PE. INR subtherapeutic perhaps due to compliance issues. Will continue home dose and adjust according to daily INR if needed after 3 days.  Goal of Therapy:  INR 2-3 Monitor platelets by anticoagulation protocol: Yes   Plan:  Continue coumadin 7.5 mg daily . Start now. Then next dose at 1800 today.  Daily INR starting today  Curlene Dolphin 09/25/2013,1:42 AM

## 2013-09-25 NOTE — Progress Notes (Signed)
[place] TEAM 1 - Stepdown/ICU TEAM Progress Note  Travis Chapman BRA:[id number] DOB: 02/26/1945 DOA: 09/24/2013 PCP: Renato Shin, MD  Admit HPI / Brief Narrative: 68 y.o. 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 On previous discharge patient was to be placed in SNF.  Presented with  Patient has complex hx of obesity hypoventilation syndrome with chronic respiratory failure tracheostomy dependent and ventilator dependent at night.  Patient was recently admit due to debility he was discharged to [facility] for 30 days and 2 weeks ago got discharged to home with home health. He was getting home PT/OT . Yesterday he was walking down the hall and felt like his legs buckled and he fell down. Denies hitting his head. He have had one more fall under similar circumstances. This AM he was unable to get up. Patient presented to emergency department stating his family is unable to take care of him due to his severe disability. They do not have a hospital bed or lift. Social work consult has been placed the plan for patient to be placed back to [facility] if possible.  Hospitalist was called for admission for Debility    HPI/Subjective: 9/10 patient states he did well at [facility] for 4 weeks with significant improvement in ability to perform ADLs, after returning home for approximately 2 weeks had multiple falls. Fell yesterday and was unable to get off the floor until he called for the EMS which found him down. Patient states he understands that it is not safe for him to stay at home alone and that he requires SNF.  Assessment/Plan: CKD stage III  -baseline creatinine 1.3-2   - 9/10 Creatinine= 1. 39 which is within patient's baseline.      Chronic diastolic heart failure  -EF 50-55% via TTE April 2015, w/ grade 1 DD  - Admission weight 164kg, ; base weight ~160.6 kg   Morbid obesity - Body mass index is 43.52 kg/(m^2).   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  -No acute complications at present  -Current INR= 1.68 slightly low, pharmacy to continue to dose   Chronic hypercarbic resp failure / OHS / Sleep apnea s/p trach / chronic nocturnal vent support  Vent support per PCCM   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  -CBG reasonably controlled at present  -7/21 hemoglobin A1c = 6.8  -Hemoglobin A1c pending  HTN - uncontrolled  -Continue amlodipine 10 mg daily  -Continue Coreg 6.25 mg BID  -Continue clonidine 0.1 mg BID  -Continue Lasix 60 mg daily -Continue losartan 50 mg daily   HLD  -Continue Zocor 20 mg daily   Severe B knee pain  -Chronic, no change from previous admission   - PT/OT; consult pending   - pain control    - Code Status: FULL  Family Communication: no family present at time of exam  Disposition Plan: Stable for discharge to SNF when facility located which will accept patient     Consultants: NA  Procedure/Significant Events: 4/12 echocardiogram;- Left ventricle: mildly dilated. mild LVH.- LVEF= 50% to 55%.  -(grade 1 diastolic dysfunction).   Culture   NA  Antibiotics:   DVT prophylaxis: NA   Devices Chronic trach present  LINES / TUBES:      Continuous Infusions:   Objective: VITAL SIGNS: Temp: 98.2 F (36.8 C) (09/10 0802) Temp src: Oral (09/10 0802) BP: 132/57 mmHg (09/10 0918) Pulse Rate: 71 (09/10 0812) SPO2; FIO2:   Intake/Output Summary (Last 24 hours) at 09/25/13 6237 Last data filed at 09/25/13 0805  Gross per 24 hour  Intake      0 ml  Output   1600 ml  Net  -1600 ml     Exam: General: A./O. x4, NAD, No acute respiratory  distress Lungs: Clear to auscultation bilaterally without wheezes or crackles Cardiovascular: Regular rate and rhythm without murmur gallop or rub normal S1 and S2 Abdomen: Morbidly obese, Nontender, nondistended, soft, bowel sounds positive, no rebound, no ascites, no appreciable mass Extremities: Bilateral lower extremity venous stasis ulcers which are healing well (eschar over both), No significant cyanosis, clubbing, or edema bilateral lower extremities  Data Reviewed: Basic Metabolic Panel:  Recent Labs Lab 09/24/13 1701 09/25/13 0353  NA 141 139  K 4.2 3.7  CL 103 103  CO2 26 27  GLUCOSE 115* 182*  BUN 17 16  CREATININE 1.40* 1.39*  CALCIUM 9.2 8.5  MG  --  2.1  PHOS  --  2.4   Liver Function Tests:  Recent Labs Lab 09/24/13 1701 09/25/13 0353  AST 16 12  ALT 10 8  ALKPHOS 78 68  BILITOT 0.3 0.3  PROT 7.3 6.4  ALBUMIN 3.3* 2.8*   No results found for this basename: LIPASE, AMYLASE,  in the last 168 hours No results found for this basename: AMMONIA,  in the last 168 hours CBC:  Recent Labs Lab 09/24/13 1701 09/25/13 0353  WBC 6.4 5.1  HGB 11.6* 10.3*  HCT 36.0* 31.9*  MCV 85.7 84.4  PLT 182 183   Cardiac Enzymes: No results found for this basename: CKTOTAL, CKMB, CKMBINDEX, TROPONINI,  in the last 168 hours BNP (last 3 results)  Recent Labs  08/01/13 0154 08/03/13 1600 09/24/13 1701  PROBNP 265.8* 844.9* 7.4   CBG:  Recent Labs Lab 09/24/13 1408 09/25/13 0129 09/25/13 0804  GLUCAP 121* 131* 161*    Recent Results (from the past 240 hour(s))  MRSA PCR SCREENING     Status: None   Collection Time    09/25/13  1:15 AM      Result Value Ref Range Status   MRSA by PCR NEGATIVE  NEGATIVE Final   Comment:            The GeneXpert MRSA Assay (FDA     approved for NASAL specimens     only), is one component of a     comprehensive MRSA colonization     surveillance program. It is not     intended to diagnose MRSA     infection nor to  guide or     monitor treatment for     MRSA infections.     Studies:  Recent x-ray studies have been reviewed in detail by the Attending Physician  Scheduled Meds:  Scheduled Meds: . alprazolam  4 mg Oral QHS  . amLODipine  10 mg Oral Daily  . carvedilol  6.25 mg Oral BID WC  . citalopram  20 mg Oral Daily  . cloNIDine  0.1 mg Oral BID  . docusate sodium  100 mg Oral BID  . enoxaparin (LOVENOX) injection  40 mg Subcutaneous Q24H  . furosemide  60 mg Oral Daily  . insulin aspart  0-20 Units Subcutaneous TID  WC  . insulin aspart  0-5 Units Subcutaneous QHS  . insulin glargine  25 Units Subcutaneous q morning - 10a  . losartan  50 mg Oral Daily  . metoCLOPramide  5 mg Oral TID WC  . polyethylene glycol  17 g Oral Daily  . potassium chloride SA  20 mEq Oral Daily  . senna-docusate  1 tablet Oral BID  . simvastatin  20 mg Oral q1800  . warfarin  7.5 mg Oral q1800  . Warfarin - Pharmacist Dosing Inpatient   Does not apply q1800    Time spent on care of this patient: 40 mins   Allie Bossier , MD   [facility] Office  [phone] Pager [phone]  On-Call/Text Page:      [email or web address]      password [email or web address]  If 7PM-7AM, please contact night-coverage [email or web address] Password [email or web address] 09/25/2013, 9:28 AM   LOS: 1 day

## 2013-09-25 NOTE — Evaluation (Signed)
Physical Therapy Evaluation Patient Details Name: Travis Chapman MRN: [id number] DOB: [birth year] Today's Date: 09/25/2013   History of Present Illness  Pt adm with weakness, deconditioning and incr falls. PMHx-Long term trach - on vent at night. Morbid obesity, DM, CHF, severe arthritis of knees with frequent falls and 6 admissions since 2/15 for falls  Clinical Impression  Pt pleasant and familiar from prior admission. Pt with good strength with seated resisted testing but limited ability functionally to hold weight in standing. Pt educated for bil LE HEP and encouraged to perform along with increased mobility and transfers with nursing. Will follow acutely to maximize gait, safety and activity to decrease burden of care.    Follow Up Recommendations LTACH    Equipment Recommendations  None recommended by PT    Recommendations for Other Services OT consult     Precautions / Restrictions Precautions Precautions: Fall Precaution Comments: obese, blind left eye, trach Restrictions Weight Bearing Restrictions: No      Mobility  Bed Mobility Overal bed mobility: Needs Assistance Bed Mobility: Supine to Sit     Supine to sit: Min assist     General bed mobility comments: use of rail with cues for sequence, HOB 20degrees and min assist to fully elevate trunk  Transfers Overall transfer level: Needs assistance   Transfers: Sit to/from Stand;Stand Pivot Transfers Sit to Stand: Min assist Stand pivot transfers: Min assist       General transfer comment: cues for hand placement with use of RW and pivotal steps to chair. pt with legs shaking but not buckling in weight bearing  Ambulation/Gait Ambulation/Gait assistance:  (pt denied attempting due to feeling unsteady today)              Stairs            Wheelchair Mobility    Modified Rankin (Stroke Patients Only)       Balance Overall balance assessment: Needs assistance   Sitting balance-Leahy Scale:  Good       Standing balance-Leahy Scale: Poor                               Pertinent Vitals/Pain Pain Assessment: No/denies pain    Home Living Family/patient expects to be discharged to:: Other (Comment) (LTACH)                 Additional Comments: Pt wears vent at night time and has O2 just in case. Pt has been on vent for 8 years.  Pt lives with his mother who is 65 y.o and cannot assist pt. Family provides assist intermittently     Prior Function Level of Independence: Needs assistance   Gait / Transfers Assistance Needed: Amb with rolling walker but with falls at home. Calls 911 when falls. Reports he was walking 200' at [place] when he left 2wks ago  ADL's / Homemaking Assistance Needed: Pt reports he sponge bathes  or uses tub bench mod I and is able to dress self.  He does not do househould management activities.  He states his daughters do that and the cooking  Comments: frequent falls     Hand Dominance        Extremity/Trunk Assessment   Upper Extremity Assessment: Defer to OT evaluation           Lower Extremity Assessment: Overall WFL for tasks assessed (bil LE to testing myotomes are 4+/5 all but shaking in standing)  Cervical / Trunk Assessment: Normal  Communication   Communication: Tracheostomy  Cognition Arousal/Alertness: Awake/alert Behavior During Therapy: Flat affect Overall Cognitive Status: Within Functional Limits for tasks assessed                      General Comments      Exercises General Exercises - Lower Extremity Long Arc Quad: AROM;Seated;Both;10 reps Hip Flexion/Marching: AROM;Seated;Both;10 reps      Assessment/Plan    PT Assessment Patient needs continued PT services  PT Diagnosis Difficulty walking   PT Problem List Decreased activity tolerance;Decreased balance;Decreased mobility;Obesity  PT Treatment Interventions Gait training;Functional mobility training;Therapeutic  activities;Therapeutic exercise;Balance training;DME instruction   PT Goals (Current goals can be found in the Care Plan section) Acute Rehab PT Goals Patient Stated Goal: return home PT Goal Formulation: With patient Time For Goal Achievement: 10/09/13 Potential to Achieve Goals: Fair    Frequency Min 3X/week   Barriers to discharge Decreased caregiver support      Co-evaluation               End of Session Equipment Utilized During Treatment: Gait belt Activity Tolerance: Patient tolerated treatment well Patient left: in chair;with call bell/phone within Chapman Nurse Communication: Mobility status    Functional Assessment Tool Used: clinical judgement Functional Limitation: Mobility: Walking and moving around Mobility: Walking and Moving Around Current Status [id number]): At least 20 percent but less than 40 percent impaired, limited or restricted Mobility: Walking and Moving Around Goal Status [id number]): At least 1 percent but less than 20 percent impaired, limited or restricted    Time: 1013-1030 PT Time Calculation (min): 17 min   Charges:   PT Evaluation $Initial PT Evaluation Tier I: 1 Procedure PT Treatments $Therapeutic Activity: 8-22 mins   PT G Codes:   Functional Assessment Tool Used: clinical judgement Functional Limitation: Mobility: Walking and moving around    Travis Chapman 09/25/2013, 10:51 AM Travis Chapman, [place]

## 2013-09-25 NOTE — Trach Care Team (Signed)
[place] Progression Note   Patient Details Name: Travis Chapman MRN: [id number] DOB: 1945/03/06 Today's Date: 09/25/2013   Tracheostomy Assessment    Tracheostomy Shiley 7 mm Distal (Active)  Status Secured 09/25/2013  8:12 AM  Site Assessment Clean;Dry 09/25/2013  8:12 AM  Inner Cannula Care Changed/new 09/25/2013  6:30 AM  Ties Assessment Clean;Dry;Secure 09/25/2013  8:12 AM  Cuff pressure (cm) 0 cm 09/25/2013  8:12 AM  Emergency Equipment at bedside Yes 09/25/2013  8:12 AM     Tracheostomy Shiley 7 mm Cuffed (Active)  Status Secured 09/25/2013 12:26 PM  Site Assessment Clean;Dry 09/25/2013 12:26 PM  Ties Assessment Clean;Dry;Secure 09/25/2013 12:26 PM  Cuff pressure (cm) 0 cm 09/25/2013 12:26 PM  Emergency Equipment at bedside Yes 09/25/2013 12:26 PM     Care Needs     Respiratory Therapy Tracheostomy: Chronic trach O2 Device: None (Room air) FiO2 (%): 40 % SpO2: 97 %    Speech Language Pathology  Needs evaluation for PMSV   Physical Therapy Ambulation/Gait assistance:  (pt denied attempting due to feeling unsteady today) PT Recommendation/Assessment: Patient needs continued PT services Follow Up Recommendations: LTACH PT equipment: None recommended by PT    Occupational Therapy      Nutritional Patient's Current Diet: Cardiac    Case Management/Social Work Unable to return home. Will need LTACH vs. Vent SNF    Provider [facility] Team Recommendations            [facility] Members -  Herbie Baltimore, SLP, Elana Alm, SW &  Tammie Readling, RT    SLP Evaluation and treat.           Mouna Yager, Jaci Carrel (scribe for team) 09/25/2013, 2:06 PM

## 2013-09-25 NOTE — Clinical Social Work Note (Signed)
CSW rec'd referral today for ?SNF- noted PT recommending LTACH-  CSW will plan assessment and dc planning further tomorrow-  [name], MSW, [place]

## 2013-09-25 NOTE — Care Management (Addendum)
This patient is an active patient with [facility]. I will follow in regards to [facility] needs at discharge.  Thank you, Christa See RN BSN [facility] [phone]

## 2013-09-26 DIAGNOSIS — Z0279 Encounter for issue of other medical certificate: Secondary | ICD-10-CM

## 2013-09-26 LAB — CBC
HCT: 31.9 % — ABNORMAL LOW (ref 39.0–52.0)
Hemoglobin: 10.7 g/dL — ABNORMAL LOW (ref 13.0–17.0)
MCH: 27.6 pg (ref 26.0–34.0)
MCHC: 33.5 g/dL (ref 30.0–36.0)
MCV: 82.4 fL (ref 78.0–100.0)
PLATELETS: 173 10*3/uL (ref 150–400)
RBC: 3.87 MIL/uL — AB (ref 4.22–5.81)
RDW: 13 % (ref 11.5–15.5)
WBC: 5.1 10*3/uL (ref 4.0–10.5)

## 2013-09-26 LAB — GLUCOSE, CAPILLARY
Glucose-Capillary: 167 mg/dL — ABNORMAL HIGH (ref 70–99)
Glucose-Capillary: 209 mg/dL — ABNORMAL HIGH (ref 70–99)
Glucose-Capillary: 98 mg/dL (ref 70–99)
Glucose-Capillary: 143 mg/dL — ABNORMAL HIGH (ref 70–99)

## 2013-09-26 LAB — PROTIME-INR
INR: 1.95 — ABNORMAL HIGH (ref 0.00–1.49)
Prothrombin Time: 22.2 seconds — ABNORMAL HIGH (ref 11.6–15.2)

## 2013-09-26 MED ORDER — INSULIN GLARGINE 100 UNIT/ML ~~LOC~~ SOLN
28.0000 [IU] | Freq: Every morning | SUBCUTANEOUS | Status: DC
  Administered 2013-09-27 – 2013-09-29 (×3): 28 [IU] via SUBCUTANEOUS
  Filled 2013-09-26 (×3): qty 0.28

## 2013-09-26 NOTE — Progress Notes (Addendum)
[place] TEAM 1 - Stepdown/ICU TEAM Progress Note  Travis Chapman PVX:[id number] DOB: 05/05/1945 DOA: 09/24/2013 PCP: Renato Shin, MD  Admit HPI / Brief Narrative: 68 y.o. 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 Fell and couldn't get up. Nothing acute found.  Assessment/Plan: CKD stage III  stable   Chronic diastolic heart failure  compensated  Morbid obesity - Body mass index is 43.52 kg/(m^2).   Hx PE Coumadin nearly therapeutic. D/c lovenox  Chronic hypercarbic resp failure / OHS / Sleep apnea s/p trach / chronic nocturnal vent support  Vent support per PCCM   DM w/ renal manifestations Increase lantus to 28 untis  HTN  stable  HLD   OA knees  Debility  Stable for discharge when dispo arranged  Code Status: DNR Family Communication: no family present at time of exam  Disposition Plan: SNF  Consultants: PCCM  Subjective No dyspnea. Chronic knee pain x 20 years  Objective: VITAL SIGNS: Temp: 98.4 F (36.9 C) (09/11 1151) Temp src: Oral (09/11 1151) BP: 115/68 mmHg (09/11 1151) Pulse Rate: 77 (09/11 1151) SPO2; FIO2:   Intake/Output Summary (Last 24 hours) at 09/26/13 1351 Last data filed at 09/26/13 1034  Gross per 24 hour  Intake      0 ml  Output   1975 ml  Net  -1975 ml     Exam: General: A./O. x4, NAD, No acute respiratory distress. In chair Lungs: Clear to auscultation bilaterally without wheezes or crackles Cardiovascular: Regular rate and rhythm without murmur gallop or rub normal S1 and S2 Abdomen: Morbidly obese, Nontender, nondistended, soft, bowel sounds positive, no rebound Extremities: brawny edema with hyperpigmentation and scale  Data Reviewed: Basic Metabolic  Panel:  Recent Labs Lab 09/24/13 1701 09/25/13 0353  NA 141 139  K 4.2 3.7  CL 103 103  CO2 26 27  GLUCOSE 115* 182*  BUN 17 16  CREATININE 1.40* 1.39*  CALCIUM 9.2 8.5  MG  --  2.1  PHOS  --  2.4   Liver Function Tests:  Recent Labs Lab 09/24/13 1701 09/25/13 0353  AST 16 12  ALT 10 8  ALKPHOS 78 68  BILITOT 0.3 0.3  PROT 7.3 6.4  ALBUMIN 3.3* 2.8*   No results found for this basename: LIPASE, AMYLASE,  in the last 168 hours No results found for this basename: AMMONIA,  in the last 168 hours CBC:  Recent Labs Lab 09/24/13 1701 09/25/13 0353 09/26/13 0320  WBC 6.4 5.1 5.1  HGB 11.6* 10.3* 10.7*  HCT 36.0* 31.9* 31.9*  MCV 85.7 84.4 82.4  PLT 182 183 173   Cardiac Enzymes: No results found for this basename: CKTOTAL, CKMB, CKMBINDEX, TROPONINI,  in the last 168 hours BNP (last 3 results)  Recent Labs  08/01/13 0154 08/03/13 1600 09/24/13 1701  PROBNP 265.8* 844.9* 7.4   CBG:  Recent Labs Lab 09/25/13 1227 09/25/13 1625 09/25/13 2144 09/26/13 0753 09/26/13 1149  GLUCAP 213* 171* 124* 167* 209*    Recent Results (from the past 240 hour(s))  MRSA PCR SCREENING     Status: None   Collection Time    09/25/13  1:15 AM      Result Value Ref Range Status   MRSA by PCR NEGATIVE  NEGATIVE Final   Comment:  The GeneXpert MRSA Assay (FDA     approved for NASAL specimens     only), is one component of a     comprehensive MRSA colonization     surveillance program. It is not     intended to diagnose MRSA     infection nor to guide or     monitor treatment for     MRSA infections.     Studies:  Recent x-ray studies have been reviewed in detail by the Attending Physician  Scheduled Meds:  Scheduled Meds: . alprazolam  4 mg Oral QHS  . amLODipine  10 mg Oral Daily  . carvedilol  6.25 mg Oral BID WC  . citalopram  20 mg Oral Daily  . cloNIDine  0.1 mg Oral BID  . docusate sodium  100 mg Oral BID  . enoxaparin (LOVENOX)  injection  40 mg Subcutaneous Q24H  . furosemide  60 mg Oral Daily  . insulin aspart  0-20 Units Subcutaneous TID WC  . insulin aspart  0-5 Units Subcutaneous QHS  . insulin glargine  25 Units Subcutaneous q morning - 10a  . losartan  50 mg Oral Daily  . metoCLOPramide  5 mg Oral TID WC  . polyethylene glycol  17 g Oral Daily  . potassium chloride SA  20 mEq Oral Daily  . senna-docusate  1 tablet Oral BID  . simvastatin  20 mg Oral q1800  . warfarin  7.5 mg Oral q1800  . Warfarin - Pharmacist Dosing Inpatient   Does not apply q1800    Time spent on care of this patient: 25 mins  Delfina Redwood, MD  [facility] [phone]  If 7PM-7AM, please contact night-coverage [email or web address] Password [email or web address] 09/26/2013, 1:51 PM   LOS: 2 days

## 2013-09-26 NOTE — Care Management Note (Signed)
    Page 1 of 1   09/26/2013     4:05:29 PM CARE MANAGEMENT NOTE 09/26/2013  Patient:  Travis Chapman, Travis Chapman   Account Number:  [id number]  Date Initiated:  09/26/2013  Documentation initiated by:  Tiajuana Leppanen  Subjective/Objective Assessment:   dx physical deconditioning; lives with mother, chronic trach and nocturnal vent; active with [place] referral  Clinical Social Worker      DC Planning Services  CM consult      Per UR Regulation:  Reviewed for med. necessity/level of care/duration of stay  Comments:  09/26/13 1556 [place] MSN BSN CCM Pt will need vent SNF - per admitting MD, pt agreed that he was not able to manage @ home and agreed to long-term placement.  Pt has 40% copay for LTAC plus does not qualify for that option.  Per dtr, Chrisandra Netters, pt applied for MCD about a month ago.  Pt now states he will only go to SNF if he can return to [place] - will return home otherwise.  [facility] has no beds today.

## 2013-09-26 NOTE — Progress Notes (Signed)
ANTICOAGULATION CONSULT NOTE - Initial Consult  Pharmacy Consult for coumadin  Indication: hx PE  No Known Allergies  Patient Measurements: Height: 6\' 2"  (188 cm) Weight: 361 lb 8.9 oz (164 kg) IBW/kg (Calculated) : 82.2  Vital Signs: Temp: 97.8 F (36.6 C) (09/11 0751) Temp src: Oral (09/11 0751) BP: 141/75 mmHg (09/11 0908) Pulse Rate: 64 (09/11 0831)  Labs:  Recent Labs  09/24/13 1701 09/25/13 0353 09/26/13 0320  HGB 11.6* 10.3* 10.7*  HCT 36.0* 31.9* 31.9*  PLT 182 183 173  LABPROT 19.7*  --  22.2*  INR 1.67*  --  1.95*  CREATININE 1.40* 1.39*  --     Estimated Creatinine Clearance: 83.8 ml/min (by C-G formula based on Cr of 1.39).  Assessment: 68 YOM chronic respiratory failure, chronic trach and noctrunal vent dependent presented with AMS and fell at home, no head involvement. Coumadin to continue due to hx PE. INR 1.95. Also on lovenox 40.  PTA dose: 7.5mg  daily  Goal of Therapy:  INR 2-3 Monitor platelets by anticoagulation protocol: Yes   Plan:  Continue coumadin 7.5 mg daily.  Daily INR D/C lovenox tomorrow if INR > 2  Maryanna Shape, PharmD, BCPS  Clinical Pharmacist  Pager: [phone]   09/26/2013,9:53 AM

## 2013-09-26 NOTE — Progress Notes (Signed)
SLP Cancellation Note  Patient Details Name: Travis Chapman MRN: [id number] DOB: [birth year]   Cancelled treatment:       Reason Eval/Treat Not Completed: Other (comment). Pt refused, says he has one at home that he doesn't use. Reports he has tried several times of the year and he doesn't tolerate them well and they blow off. Educated pt regarding hand hygiene if he uses his finger to occlude trach. Pt verbalized understanding. Will discontinue orders  [facility], MA CCC-SLP [phone]  Lynann Beaver 09/26/2013, 2:09 PM

## 2013-09-26 NOTE — Clinical Documentation Improvement (Signed)
Presents with Debility; has many comorbid conditions such as OHS, Chronic Respiratory Failure, Vent dependent via Trach, Morbid Obesity with BMI of 46.   Please clarify the likely etiology of the patient's debility if known: OHS                Chronic Respiratory Failure                Morbid Obesity                Combination of the above                            Other Condition  Thank You, Zoila Shutter ,RN Clinical Documentation Specialist:  [place] Information Management

## 2013-09-26 NOTE — Clinical Social Work Placement (Signed)
Clinical Social Work Department CLINICAL SOCIAL WORK PLACEMENT NOTE 09/26/2013  Patient:  DEVONTA, BLANFORD  Account Number:  [id number] Admit date:  09/24/2013  Clinical Social Worker:  Daiva Huge  Date/time:  09/26/2013 03:10 PM  Clinical Social Work is seeking post-discharge placement for this patient at the following level of care:   [place]   (*CSW will update this form in Epic as items are completed)   09/26/2013  Patient/family provided with [place] Department of Clinical Social Work's list of facilities offering this level of care within the geographic area requested by the patient (or if unable, by the patient's family).  09/26/2013  Patient/family informed of their freedom to choose among providers that offer the needed level of care, that participate in Medicare, Medicaid or managed care program needed by the patient, have an available bed and are willing to accept the patient.  09/26/2013  Patient/family informed of [facility]' ownership interest in [facility], as well as of the fact that they are under no obligation to receive care at this facility.  PASARR submitted to EDS on 09/26/2013 PASARR number received on 09/26/2013  FL2 transmitted to all facilities in geographic area requested by pt/family on  09/26/2013 FL2 transmitted to all facilities within larger geographic area on   Patient informed that his/her managed care company has contracts with or will negotiate with  certain facilities, including the following:     Patient/family informed of bed offers received:   Patient chooses bed at  Physician recommends and patient chooses bed at    Patient to be transferred to  on   Patient to be transferred to facility by  Patient and family notified of transfer on  Name of family member notified:    The following physician request were entered in Epic:   Additional Comments: [name], MSW, [place]

## 2013-09-26 NOTE — Clinical Social Work Psychosocial (Signed)
Clinical Social Work Department BRIEF PSYCHOSOCIAL ASSESSMENT 09/26/2013  Patient:  Travis Chapman, Travis Chapman     Account Number:  [id number]     Admit date:  09/24/2013  Clinical Social Worker:  Daiva Huge  Date/Time:  09/26/2013 02:31 PM  Referred by:  Physician  Date Referred:  09/26/2013 Referred for  SNF Placement   Other Referral:   Interview type:  Patient Other interview type:    PSYCHOSOCIAL DATA Living Status:  FAMILY Admitted from facility:   Level of care:   Primary support name:  mother Primary support relationship to patient:  FAMILY Degree of support available:   minimal- lives with 67yo mom    CURRENT CONCERNS Current Concerns  Post-Acute Placement   Other Concerns:    SOCIAL WORK ASSESSMENT / PLAN Met with patient to discuss dc plans- SNF is being recommended and he reports living with his 67yo mom- He understands that he may need SNF rehab and would like to consider going back to [facility] unit where he was in the past and had "great care".  He speaks highly of the staff at [facility]- reporting that the staff transported his mom to visit him while there-   Assessment/plan status:  Other - See comment Other assessment/ plan:   Information/referral to community resources:   SNF/vent SNF list    PATIENT'S/FAMILY'S RESPONSE TO PLAN OF CARE: Patient is quite adamant that he go to [facility] SNF- "I can't go anywhere else and be far away from my mom- she's 14"  Advised pateint that we would do all we could to get him back there to [facility] SNF-  he shared concern for his mother and her not being able to visit if he is farther away- "my heart wont let me".    CSW has called [facility] SN[facility] and spoke to their administrator who says they would love to have him back if they have a bed...[facility] Kitchen currently no beds available.       Travis Chapman, MSW, [place]

## 2013-09-27 LAB — GLUCOSE, CAPILLARY
GLUCOSE-CAPILLARY: 149 mg/dL — AB (ref 70–99)
Glucose-Capillary: 207 mg/dL — ABNORMAL HIGH (ref 70–99)
Glucose-Capillary: 195 mg/dL — ABNORMAL HIGH (ref 70–99)
Glucose-Capillary: 119 mg/dL — ABNORMAL HIGH (ref 70–99)

## 2013-09-27 LAB — PROTIME-INR
INR: 1.86 — AB (ref 0.00–1.49)
PROTHROMBIN TIME: 21.4 s — AB (ref 11.6–15.2)

## 2013-09-27 MED ORDER — WARFARIN SODIUM 10 MG PO TABS
10.0000 mg | ORAL_TABLET | Freq: Once | ORAL | Status: AC
  Administered 2013-09-27: 10 mg via ORAL
  Filled 2013-09-27: qty 1

## 2013-09-27 MED ORDER — INFLUENZA VAC SPLIT QUAD 0.5 ML IM SUSY
0.5000 mL | PREFILLED_SYRINGE | INTRAMUSCULAR | Status: AC
  Administered 2013-09-28: 0.5 mL via INTRAMUSCULAR
  Filled 2013-09-27: qty 0.5

## 2013-09-27 NOTE — Evaluation (Signed)
Occupational Therapy Evaluation Patient Details Name: Roland Lipke MRN: [id number] DOB: [birth year] Today's Date: 09/27/2013    History of Present Illness Pt adm with weakness, deconditioning and incr falls. PMHx-Long term trach - on vent at night. Morbid obesity, DM, CHF, severe arthritis of knees with frequent falls and 6 admissions since 2/15 for falls   Clinical Impression   Pt admitted with above. He demonstrates the below listed deficits and will benefit from continued OT to maximize safety and independence with BADLs.  Pt presents to OT with generalized weakness and h/o falls.  Currently, he requires mod A with BADLs, and min A for functional mobility.   Feel he will need LTACH at discharge.       Follow Up Recommendations  LTACH    Equipment Recommendations  None recommended by OT    Recommendations for Other Services       Precautions / Restrictions Precautions Precautions: Fall Precaution Comments: obese, blind left eye, trach      Mobility Bed Mobility Overal bed mobility: Needs Assistance Bed Mobility: Supine to Sit     Supine to sit: Min assist     General bed mobility comments: Relies on use of rails and required assist to move Rt. LE off EOB  Transfers Overall transfer level: Needs assistance Equipment used: Standard walker Transfers: Sit to/from Stand;Stand Pivot Transfers Sit to Stand: Min assist Stand pivot transfers: Min assist       General transfer comment: Bed height elevated.  Her required assist to power up and for balance when turning     Balance Overall balance assessment: Needs assistance Sitting-balance support: Feet supported Sitting balance-Leahy Scale: Good     Standing balance support: Bilateral upper extremity supported Standing balance-Leahy Scale: Poor                              ADL Overall ADL's : Needs assistance/impaired Eating/Feeding: Independent   Grooming: Wash/dry face;Wash/dry hands;Oral  care;Brushing hair;Set up;Sitting   Upper Body Bathing: Set up;Sitting   Lower Body Bathing: Moderate assistance;Sit to/from stand   Upper Body Dressing : Set up;Sitting   Lower Body Dressing: Moderate assistance;Sit to/from stand   Toilet Transfer: Minimal assistance;Stand-pivot;BSC;RW Armed forces technical officer Details (indicate cue type and reason): Pt with difficulty powering up into standing position  Toileting- Clothing Manipulation and Hygiene: Moderate assistance;Sit to/from stand       Functional mobility during ADLs: Minimal assistance;Rolling walker General ADL Comments: Pt reports general malaise this am.  He was incontinent of urine and assisted with peri care/clean up.  He reports he feels today, like he did they day he was admitted      Vision                     Perception     Praxis      Pertinent Vitals/Pain Pain Assessment: Faces Faces Pain Scale: Hurts even more Pain Location: bil knees Pain Descriptors / Indicators: Aching Pain Intervention(s): Patient requesting pain meds-RN notified;Limited activity within patient's tolerance;Monitored during session     Hand Dominance Right   Extremity/Trunk Assessment Upper Extremity Assessment Upper Extremity Assessment: Generalized weakness   Lower Extremity Assessment Lower Extremity Assessment: Defer to PT evaluation   Cervical / Trunk Assessment Cervical / Trunk Assessment: Normal   Communication Communication Communication: Tracheostomy   Cognition Arousal/Alertness: Awake/alert Behavior During Therapy: Flat affect Overall Cognitive Status: Within Functional Limits for tasks assessed  General Comments       Exercises       Shoulder Instructions      Home Living Family/patient expects to be discharged to:: Other (Comment)                                 Additional Comments: Pt wears vent at night time and has O2 just in case. Pt has been on vent for  8 years.  Pt lives with his mother who is 67 y.o and cannot assist pt. Family provides assist intermittently       Prior Functioning/Environment Level of Independence: Needs assistance  Gait / Transfers Assistance Needed: Amb with rolling walker but with falls at home. Calls 911 when falls. Reports he was walking 200' at [place] when he left 2wks ago ADL's / Homemaking Assistance Needed: Pt reports he sponge bathes  or uses tub bench mod I and is able to dress self.  He does not do househould management activities.  He states his daughters do that and the cooking Communication / Swallowing Assistance Needed: Pt with trach Comments: frequent falls    OT Diagnosis: Generalized weakness;Acute pain   OT Problem List: Decreased strength;Impaired balance (sitting and/or standing);Decreased activity tolerance;Decreased knowledge of use of DME or AE;Pain;Obesity   OT Treatment/Interventions: Self-care/ADL training;Therapeutic exercise;DME and/or AE instruction;Therapeutic activities;Patient/family education;Balance training    OT Goals(Current goals can be found in the care plan section) Acute Rehab OT Goals Patient Stated Goal: return home OT Goal Formulation: With patient Time For Goal Achievement: 10/11/13 Potential to Achieve Goals: Good ADL Goals Pt Will Perform Grooming: with supervision;standing Pt Will Perform Lower Body Bathing: with supervision;sit to/from stand Pt Will Perform Lower Body Dressing: with supervision;sit to/from stand Pt Will Transfer to Toilet: with supervision;regular height toilet;bedside commode;grab bars;ambulating Pt Will Perform Toileting - Clothing Manipulation and hygiene: with supervision;sit to/from stand Pt/caregiver will Perform Home Exercise Program: Increased strength;Both right and left upper extremity;With theraband;With written HEP provided  OT Frequency: Min 2X/week   Barriers to D/C: Decreased caregiver support          Co-evaluation               End of Session Equipment Utilized During Treatment: Rolling walker Nurse Communication: Patient requests pain meds;Mobility status  Activity Tolerance: Patient tolerated treatment well Patient left: in chair;with call bell/phone within reach   Time: 0901-0933 OT Time Calculation (min): 32 min Charges:  OT General Charges $OT Visit: 1 Procedure OT Evaluation $Initial OT Evaluation Tier I: 1 Procedure OT Treatments $Self Care/Home Management : 8-22 mins $Therapeutic Activity: 8-22 mins G-Codes:    Emmilia Sowder M 10/11/13, 11:09 AM

## 2013-09-27 NOTE — Progress Notes (Signed)
[place] TEAM 1 - Stepdown/ICU TEAM Progress Note  Travis Chapman LNL:[id number] DOB: 02-Apr-1945 DOA: 09/24/2013 PCP: Renato Shin, MD  Admit HPI / Brief Narrative: 68 y.o. 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 Fell and couldn't get up. Nothing acute found.  Assessment/Plan: CKD stage III  stable   Chronic diastolic heart failure  compensated  Morbid obesity - Body mass index is 43.52 kg/(m^2).   Hx PE Coumadin nearly therapeutic. D/c lovenox  Chronic hypercarbic resp failure / OHS / Sleep apnea s/p trach / chronic nocturnal vent support  Vent support per PCCM   DM w/ renal manifestations Increase lantus to 28 untis  HTN  stable  HLD   OA knees  Debility  Stable for discharge when dispo arranged  Code Status: DNR Family Communication: no family present at time of exam  Disposition Plan: SNF  Consultants: PCCM  Subjective No dyspnea. Chronic knee pain x 20 years  Objective: VITAL SIGNS: Temp: 98.4 F (36.9 C) (09/12 1200) Temp src: Oral (09/12 1200) BP: 126/80 mmHg (09/12 1235) Pulse Rate: 71 (09/12 1235) SPO2; FIO2:   Intake/Output Summary (Last 24 hours) at 09/27/13 1402 Last data filed at 09/27/13 0819  Gross per 24 hour  Intake    600 ml  Output   1900 ml  Net  -1300 ml     Exam: General: A./O. x4, NAD, No acute respiratory distress. In chair Lungs: Clear to auscultation bilaterally without wheezes or crackles Cardiovascular: Regular rate and rhythm without murmur gallop or rub normal S1 and S2 Abdomen: Morbidly obese, Nontender, nondistended, soft, bowel sounds positive, no rebound Extremities: brawny edema with hyperpigmentation and scale  Data Reviewed: Basic Metabolic  Panel:  Recent Labs Lab 09/24/13 1701 09/25/13 0353  NA 141 139  K 4.2 3.7  CL 103 103  CO2 26 27  GLUCOSE 115* 182*  BUN 17 16  CREATININE 1.40* 1.39*  CALCIUM 9.2 8.5  MG  --  2.1  PHOS  --  2.4   Liver Function Tests:  Recent Labs Lab 09/24/13 1701 09/25/13 0353  AST 16 12  ALT 10 8  ALKPHOS 78 68  BILITOT 0.3 0.3  PROT 7.3 6.4  ALBUMIN 3.3* 2.8*   No results found for this basename: LIPASE, AMYLASE,  in the last 168 hours No results found for this basename: AMMONIA,  in the last 168 hours CBC:  Recent Labs Lab 09/24/13 1701 09/25/13 0353 09/26/13 0320  WBC 6.4 5.1 5.1  HGB 11.6* 10.3* 10.7*  HCT 36.0* 31.9* 31.9*  MCV 85.7 84.4 82.4  PLT 182 183 173   Cardiac Enzymes: No results found for this basename: CKTOTAL, CKMB, CKMBINDEX, TROPONINI,  in the last 168 hours BNP (last 3 results)  Recent Labs  08/01/13 0154 08/03/13 1600 09/24/13 1701  PROBNP 265.8* 844.9* 7.4   CBG:  Recent Labs Lab 09/26/13 1149 09/26/13 1551 09/26/13 2147 09/27/13 0734 09/27/13 1135  GLUCAP 209* 98 143* 149* 207*    Recent Results (from the past 240 hour(s))  MRSA PCR SCREENING     Status: None   Collection Time    09/25/13  1:15 AM      Result Value Ref Range Status   MRSA by PCR NEGATIVE  NEGATIVE Final   Comment:  The GeneXpert MRSA Assay (FDA     approved for NASAL specimens     only), is one component of a     comprehensive MRSA colonization     surveillance program. It is not     intended to diagnose MRSA     infection nor to guide or     monitor treatment for     MRSA infections.     Studies:  Recent x-ray studies have been reviewed in detail by the Attending Physician  Scheduled Meds:  Scheduled Meds: . alprazolam  4 mg Oral QHS  . amLODipine  10 mg Oral Daily  . carvedilol  6.25 mg Oral BID WC  . citalopram  20 mg Oral Daily  . cloNIDine  0.1 mg Oral BID  . docusate sodium  100 mg Oral BID  . furosemide  60 mg Oral Daily   . [START ON 09/28/2013] Influenza vac split quadrivalent PF  0.5 mL Intramuscular Tomorrow-1000  . insulin aspart  0-20 Units Subcutaneous TID WC  . insulin aspart  0-5 Units Subcutaneous QHS  . insulin glargine  28 Units Subcutaneous q morning - 10a  . losartan  50 mg Oral Daily  . metoCLOPramide  5 mg Oral TID WC  . polyethylene glycol  17 g Oral Daily  . potassium chloride SA  20 mEq Oral Daily  . senna-docusate  1 tablet Oral BID  . simvastatin  20 mg Oral q1800  . warfarin  10 mg Oral ONCE-1800  . Warfarin - Pharmacist Dosing Inpatient   Does not apply q1800    Time spent on care of this patient: 15 mins  Delfina Redwood, MD  [facility] [phone]  If 7PM-7AM, please contact night-coverage [email or web address] Password [email or web address] 09/27/2013, 2:02 PM   LOS: 3 days

## 2013-09-27 NOTE — Progress Notes (Signed)
[employer] for coumadin  Indication: hx PE  No Known Allergies  Patient Measurements: Height: 6\' 2"  (188 cm) Weight: 349 lb 3.3 oz (158.4 kg) IBW/kg (Calculated) : 82.2  Vital Signs: Temp: 98.3 F (36.8 C) (09/12 0800) Temp src: Oral (09/12 0800) BP: 118/58 mmHg (09/12 1023) Pulse Rate: 72 (09/12 0900)  Labs:  Recent Labs  09/24/13 1701 09/25/13 0353 09/26/13 0320 09/27/13 0520  HGB 11.6* 10.3* 10.7*  --   HCT 36.0* 31.9* 31.9*  --   PLT 182 183 173  --   LABPROT 19.7*  --  22.2* 21.4*  INR 1.67*  --  1.95* 1.86*  CREATININE 1.40* 1.39*  --   --     Estimated Creatinine Clearance: 82.2 ml/min (by C-G formula based on Cr of 1.39).  Assessment: 13 YOM chronic respiratory failure, chronic trach and noctrunal vent dependent presented with AMS and fall at home with no head involvement. With history on PE and continued on warfarin- PTA dose: 7.5mg  daily. INR this morning dropped to 1.86 despite getting home dosing. Hgb low but stable, plts good0 no bleeding noted. MD discontinued prophylactic Lovenox yesterday.  Goal of Therapy:  INR 2-3 Monitor platelets by anticoagulation protocol: Yes   Plan:  1. Warfarin 10mg  po x1 tonight 2. daily PT/INR  Bennye Nix D. Martez Weiand, PharmD, BCPS Clinical Pharmacist Pager: [phone] 09/27/2013 10:32 AM

## 2013-09-28 DIAGNOSIS — R5381 Other malaise: Secondary | ICD-10-CM | POA: Diagnosis not present

## 2013-09-28 LAB — GLUCOSE, CAPILLARY
Glucose-Capillary: 161 mg/dL — ABNORMAL HIGH (ref 70–99)
Glucose-Capillary: 169 mg/dL — ABNORMAL HIGH (ref 70–99)
Glucose-Capillary: 141 mg/dL — ABNORMAL HIGH (ref 70–99)
Glucose-Capillary: 222 mg/dL — ABNORMAL HIGH (ref 70–99)

## 2013-09-28 LAB — PROTIME-INR
INR: 1.8 — AB (ref 0.00–1.49)
Prothrombin Time: 20.9 seconds — ABNORMAL HIGH (ref 11.6–15.2)

## 2013-09-28 MED ORDER — WARFARIN SODIUM 2.5 MG PO TABS
12.5000 mg | ORAL_TABLET | Freq: Once | ORAL | Status: AC
  Administered 2013-09-28: 12.5 mg via ORAL
  Filled 2013-09-28: qty 1

## 2013-09-28 NOTE — Progress Notes (Signed)
ANTICOAGULATION CONSULT NOTE   Pharmacy Consult for coumadin  Indication: hx PE  No Known Allergies  Patient Measurements: Height: 6\' 2"  (188 cm) Weight: 349 lb 3.3 oz (158.4 kg) IBW/kg (Calculated) : 82.2  Vital Signs: Temp: 98.4 F (36.9 C) (09/13 0803) Temp src: Oral (09/13 0803) BP: 134/86 mmHg (09/13 0803) Pulse Rate: 77 (09/13 0803)  Labs:  Recent Labs  09/26/13 0320 09/27/13 0520 09/28/13 0704  HGB 10.7*  --   --   HCT 31.9*  --   --   PLT 173  --   --   LABPROT 22.2* 21.4* 20.9*  INR 1.95* 1.86* 1.80*    Estimated Creatinine Clearance: 82.2 ml/min (by C-G formula based on Cr of 1.39).  Assessment: 68 YOM chronic respiratory failure, chronic trach and noctrunal vent dependent presented with AMS and fall at home with no head involvement. With history on PE and continued on warfarin- PTA dose: 7.5mg  daily.  INR this morning dropped to 1.8 despite getting home dosing for several days and a higher dose last evening. Hgb low but stable, plts good0 no bleeding noted.  Goal of Therapy:  INR 2-3 Monitor platelets by anticoagulation protocol: Yes   Plan:  1. Warfarin 12.5mg  po x1 tonight 2. If patient discharged today, recommend warfarin 10mg  daily until an INR check on Wednesday or Thursday of this week 3. daily PT/INR while patient is in hospital  Lorissa Kishbaugh D. Chevie Birkhead, PharmD, BCPS Clinical Pharmacist Pager: [phone] 09/28/2013 8:30 AM

## 2013-09-28 NOTE — Progress Notes (Signed)
[facility] TEAM 1 - Stepdown/ICU TEAM Progress Note  Travis Chapman XNT:[id number] DOB: 1945/04/13 DOA: 09/24/2013 PCP: Renato Shin, MD  Admit HPI / Brief Narrative: 68 y.o. 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 Fell and couldn't get up. Nothing acute found.  Assessment/Plan: CKD stage III  stable   Chronic diastolic heart failure  compensated  Morbid obesity - Body mass index is 43.52 kg/(m^2).   Hx PE Coumadin nearly therapeutic. D/c lovenox  Chronic hypercarbic resp failure / OHS / Sleep apnea s/p trach / chronic nocturnal vent support  Vent support per PCCM   DM w/ renal manifestations Increase lantus to 28 untis  HTN  stable  HLD   OA knees  Debility  Stable for discharge when dispo arranged  Code Status: DNR Family Communication: no family present at time of exam  Disposition Plan: SNF  Consultants: PCCM  Subjective No dyspnea. Chronic knee pain x 20 years  Objective: VITAL SIGNS: Temp: 98.3 F (36.8 C) (09/13 1203) Temp src: Oral (09/13 1203) BP: 113/66 mmHg (09/13 1230) Pulse Rate: 67 (09/13 1230) SPO2; FIO2:   Intake/Output Summary (Last 24 hours) at 09/28/13 1400 Last data filed at 09/28/13 1230  Gross per 24 hour  Intake    840 ml  Output   1675 ml  Net   -835 ml     Exam: General: A./O. x4, NAD, No acute respiratory distress. In chair Lungs: Clear to auscultation bilaterally without wheezes or crackles Cardiovascular: Regular rate and rhythm without murmur gallop or rub normal S1 and S2 Abdomen: Morbidly obese, Nontender, nondistended, soft, bowel sounds positive, no rebound Extremities: brawny edema with hyperpigmentation and scale  Data Reviewed: Basic Metabolic  Panel:  Recent Labs Lab 09/24/13 1701 09/25/13 0353  NA 141 139  K 4.2 3.7  CL 103 103  CO2 26 27  GLUCOSE 115* 182*  BUN 17 16  CREATININE 1.40* 1.39*  CALCIUM 9.2 8.5  MG  --  2.1  PHOS  --  2.4   Liver Function Tests:  Recent Labs Lab 09/24/13 1701 09/25/13 0353  AST 16 12  ALT 10 8  ALKPHOS 78 68  BILITOT 0.3 0.3  PROT 7.3 6.4  ALBUMIN 3.3* 2.8*   No results found for this basename: LIPASE, AMYLASE,  in the last 168 hours No results found for this basename: AMMONIA,  in the last 168 hours CBC:  Recent Labs Lab 09/24/13 1701 09/25/13 0353 09/26/13 0320  WBC 6.4 5.1 5.1  HGB 11.6* 10.3* 10.7*  HCT 36.0* 31.9* 31.9*  MCV 85.7 84.4 82.4  PLT 182 183 173   Cardiac Enzymes: No results found for this basename: CKTOTAL, CKMB, CKMBINDEX, TROPONINI,  in the last 168 hours BNP (last 3 results)  Recent Labs  08/01/13 0154 08/03/13 1600 09/24/13 1701  PROBNP 265.8* 844.9* 7.4   CBG:  Recent Labs Lab 09/27/13 1135 09/27/13 1608 09/27/13 2204 09/28/13 0804 09/28/13 1206  GLUCAP 207* 195* 119* 161* 169*    Recent Results (from the past 240 hour(s))  MRSA PCR SCREENING     Status: None   Collection Time    09/25/13  1:15 AM      Result Value Ref Range Status   MRSA by PCR NEGATIVE  NEGATIVE Final   Comment:  The GeneXpert MRSA Assay (FDA     approved for NASAL specimens     only), is one component of a     comprehensive MRSA colonization     surveillance program. It is not     intended to diagnose MRSA     infection nor to guide or     monitor treatment for     MRSA infections.     Studies:  Recent x-ray studies have been reviewed in detail by the Attending Physician  Scheduled Meds:  Scheduled Meds: . alprazolam  4 mg Oral QHS  . amLODipine  10 mg Oral Daily  . carvedilol  6.25 mg Oral BID WC  . citalopram  20 mg Oral Daily  . cloNIDine  0.1 mg Oral BID  . docusate sodium  100 mg Oral BID  . furosemide  60 mg Oral Daily   . insulin aspart  0-20 Units Subcutaneous TID WC  . insulin aspart  0-5 Units Subcutaneous QHS  . insulin glargine  28 Units Subcutaneous q morning - 10a  . losartan  50 mg Oral Daily  . metoCLOPramide  5 mg Oral TID WC  . polyethylene glycol  17 g Oral Daily  . potassium chloride SA  20 mEq Oral Daily  . senna-docusate  1 tablet Oral BID  . simvastatin  20 mg Oral q1800  . warfarin  12.5 mg Oral ONCE-1800  . Warfarin - Pharmacist Dosing Inpatient   Does not apply q1800    Time spent on care of this patient: 15 mins  Delfina Redwood, MD  [facility] [phone]  If 7PM-7AM, please contact night-coverage [email or web address] Password [email or web address] 09/28/2013, 2:00 PM   LOS: 4 days

## 2013-09-28 NOTE — Progress Notes (Signed)
This note also relates to the following rows which could not be included: Pulse Rate - Cannot attach notes to unvalidated device data Resp - Cannot attach notes to unvalidated device data SpO2 - Cannot attach notes to unvalidated device data   Patient removed from vent and placed on Room air.  Cuff deflated at this time.  No complications noted.  Will continue to monitor.

## 2013-09-29 LAB — BASIC METABOLIC PANEL
ANION GAP: 13 (ref 5–15)
BUN: 22 mg/dL (ref 6–23)
CHLORIDE: 101 meq/L (ref 96–112)
CO2: 24 mEq/L (ref 19–32)
Calcium: 8.4 mg/dL (ref 8.4–10.5)
Creatinine, Ser: 1.64 mg/dL — ABNORMAL HIGH (ref 0.50–1.35)
GFR calc Af Amer: 48 mL/min — ABNORMAL LOW (ref >90)
GFR calc non Af Amer: 42 mL/min — ABNORMAL LOW (ref >90)
Glucose, Bld: 128 mg/dL — ABNORMAL HIGH (ref 70–99)
Potassium: 3.4 mEq/L — ABNORMAL LOW (ref 3.7–5.3)
Sodium: 138 mEq/L (ref 137–147)

## 2013-09-29 LAB — PROTIME-INR
INR: 1.91 — ABNORMAL HIGH (ref 0.00–1.49)
Prothrombin Time: 21.9 seconds — ABNORMAL HIGH (ref 11.6–15.2)

## 2013-09-29 LAB — CBC
HCT: 31 % — ABNORMAL LOW (ref 39.0–52.0)
Hemoglobin: 10.3 g/dL — ABNORMAL LOW (ref 13.0–17.0)
MCH: 27.2 pg (ref 26.0–34.0)
MCHC: 33.2 g/dL (ref 30.0–36.0)
MCV: 82 fL (ref 78.0–100.0)
PLATELETS: 171 10*3/uL (ref 150–400)
RBC: 3.78 MIL/uL — AB (ref 4.22–5.81)
RDW: 13 % (ref 11.5–15.5)
WBC: 5 10*3/uL (ref 4.0–10.5)

## 2013-09-29 LAB — GLUCOSE, CAPILLARY
GLUCOSE-CAPILLARY: 129 mg/dL — AB (ref 70–99)
Glucose-Capillary: 203 mg/dL — ABNORMAL HIGH (ref 70–99)

## 2013-09-29 MED ORDER — WARFARIN SODIUM 10 MG PO TABS
10.0000 mg | ORAL_TABLET | Freq: Once | ORAL | Status: AC
  Administered 2013-09-29: 10 mg via ORAL
  Filled 2013-09-29: qty 1

## 2013-09-29 MED ORDER — INSULIN GLARGINE 100 UNIT/ML ~~LOC~~ SOLN
28.0000 [IU] | Freq: Every morning | SUBCUTANEOUS | Status: DC
Start: 2013-09-29 — End: 2014-05-21

## 2013-09-29 MED ORDER — DSS 100 MG PO CAPS: 100.0000 mg | ORAL_CAPSULE | Freq: Two times a day (BID) | ORAL | Status: AC

## 2013-09-29 NOTE — Progress Notes (Signed)
S:  Pt denies acute complaints related to trach, no secretions or SOB.    O:  Filed Vitals:   09/29/13 1114  BP: 102/55  Pulse: 65  Temp: 97.9 F (36.6 C)  Resp: 17    Exam:  Gen: obese adult male in NAD, up to chair Neuro: intact, non-focal CV:  s1s2 rrr Resp: resp's even/non-labored, lungs bilaterally clear, no secretions at trach site, trach c/d/i Skin: warm/dry  A:  Chronic Hypoxic, Hypercapnic Respiratory Failure Tracheostomy Status  OSA / OHS - followed by Dr. Lamonte Sakai, on nocturnal vent at baseline Hx Recurrent PE's  P:  Continue nocturnal vent support, PRVC 600 / 14 / 5 / 40% Oxygen to keep sats 90-95% PRN CXR if change in symptoms Coumadin per primary SVC  Summary:  PCCM will monitor intermittently while he is in hospital to assist with trach and nocturnal vent management.  Please call if acute needs arise.   Noe Gens, NP-C [facility] Pgr: [phone] or [phone]  Roselie Awkward, MD [facility] Pager: [phone] Cell: [phone] If no response, call [phone]

## 2013-09-29 NOTE — Clinical Social Work Note (Signed)
CSW spoke with Marzetta Board at [facility]- [phone].  Erline Levine reported that there are no beds available at this time.  Domenica Reamer, [place] Social Worker [phone]

## 2013-09-29 NOTE — Clinical Social Work Note (Signed)
Patient will discharge to home Anticipated discharge date: 09/29/13 Family notified:family at bedside and aware of discharge plan Transportation by [facility]- transport scheduled for 4pm  CSW signing off.  Domenica Reamer, [place] Social Worker [phone]

## 2013-09-29 NOTE — Discharge Instructions (Signed)
Deconditioning Deconditioning refers to the changes in your body that occur during a period of inactivity. Deconditioning results in changes to your heart, lungs, and muscles. These changes decrease your ability to endure activity, resulting in feelings of fatigue and weakness. Deconditioning can occur after only a few days of bed rest or inactivity. The longer the period of inactivity, the more severe your symptoms of deconditioning will be. After longer periods of inactivity, it will also take longer for you to return to your previous level of functioning. Deconditioning can be mild, moderate, or severe:  Mild. Your condition interferes with your ability to perform your usual types of exercise, such as running, biking, or swimming.  Moderate. Your condition interferes with your ability to do normal everyday activities. This may include walking, grocery shopping, or doing chores or lawn work.  Severe. Your condition interferes with your ability to perform minimal activity or normal self-care. CAUSES  Some common reasons for inactivity that may result in deconditioning include:  Illnesses, such as cancer, stroke, heart attack, fibromyalgia, or chronic fatigue syndrome.  Injuries, especially back injuries, broken bones, or injury to ligaments or tendons.  Surgery or a long stay in the hospital for any reason.  Pregnancy, especially with conditions that require long periods of bed rest. RISK FACTORS Anything that results in a period of hospitalization or bed rest will put you at risk of deconditioning. Some other factors that can increase the risk include:  Obesity.  Poor nutrition.  Old age.  Injuries or illnesses that interfere with movement and activity. SIGNS AND SYMPTOMS  Feeling weak.  Feeling tired.  Shortness of breath with minor exertion.  Your heart beating faster than normal. You may or may not notice this without taking your pulse.  Pain or discomfort with  activity.  Decreased strength.  Decreased sense of balance.  Decreased endurance.  Difficulty participating in usual forms of exercise.  Difficulty doing activities of daily living, such as grocery shopping or chores.  Difficulty walking around the house and doing basic self-care, such as getting to the bathroom, preparing meals, or doing laundry. DIAGNOSIS  There is no specific test to diagnose deconditioning. Your health care provider will take your medical history and do a physical exam. During the physical exam, the health care provider will check for signs of deconditioning, such as:  Decreased size of muscles.  Decreased strength.  Difficulty with balance.  Shortness of breath or abnormally increased heart rate after minor exertion. TREATMENT  Treatment usually involves a structured exercise program in which activity is increased gradually. Your health care provider will determine which exercises are right for you. The exercise program will likely include aerobic exercise and strength training. Aerobic exercise helps improve the functioning of the heart and lungs as well as the muscles. Strength training helps improve muscle size and strength. Both of these types of exercise will improve your endurance. You may be referred to a physical therapist who can create a safe strengthening program for you to follow. HOME CARE INSTRUCTIONS  Follow the exercise program recommended by your health care provider or physical therapist.  Do not increase your exercise any faster than directed.  Eat a healthy diet.  If your health care provider thinks that you need to lose weight, consider seeing a dietitian to help you do so in a healthy way.  Do not use any tobacco products, including cigarettes, chewing tobacco, or electronic cigarettes. If you need help quitting, ask your health care provider.  Take  medicines only as directed by your health care provider.  Keep all follow-up visits as  directed by your health care provider. This is important. SEEK MEDICAL CARE IF:  You are not able to carry out the prescribed exercise program.  You are not able to carry out your usual level of activity.  You are having trouble doing normal household chores or caring for yourself.  You are becoming increasingly fatigued and weak.  You become light-headed when rising to a sitting or standing position.  Your level of endurance decreases after having improved. SEEK IMMEDIATE MEDICAL CARE IF:  You have chest pain.  You are very short of breath.  You have any episodes of passing out. Document Released: 05/19/2013 Document Reviewed: 01/07/2013 [employer] Patient Information 2015 [employer]. This information is not intended to replace advice given to you by your health care provider. Make sure you discuss any questions you have with your health care provider.  

## 2013-09-29 NOTE — Progress Notes (Signed)
Deflated cuff placed on RA-pt tol well

## 2013-09-29 NOTE — Discharge Summary (Signed)
Physician Discharge Summary  Travis Chapman LGX:[id number] DOB: Sep 21, 1945 DOA: 09/24/2013  PCP: Renato Shin, MD  Admit date: 09/24/2013 Discharge date: 09/29/2013  Time spent:  35 minutes  Recommendations for Outpatient Follow-up:  1. Follow up with your PCP ASAP after discharge- call immediately to arrange OP visit 2. 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 On previous discharge patient was placed in SNF ventilator unit at [place]. He has a complex hx of obesity hypoventilation syndrome with chronic respiratory failure tracheostomy dependent and ventilator dependent at night with frequent readmisisons due to inability to manage at home.   Patient was recently admitted due to debility he was discharged to [facility] for 30 days and 2 weeks ago and was discharged to home with home health. He was receiving home PT/OT. 24 hours prior to presentation he was walking down the hall and felt his legs buckle and he fell down. Denied hitting his  head. He had one more fall under similar circumstances prior to arrival. On the AM of admission he was unable to get up.  Patient presented to the emergency department stating his family was unable to take care of him due to his severe disability. They do not have a hospital bed or lift.   Hospital Course:  Physical deconditioning Despite recommendations to discharge to a ventilator specific skilled nursing facility the patient has declined, preferring to go home. The potential skilled nursing facilities that would be available are likely outside of [place] and this is the rationale for patient preferring to return to home. He had previously been at [facility] ventilator skilled nursing facility and apparently had done well enough he did not meet criteria to remain and was discharged home. After arriving to home despite PT/OT he had recurrent falls. He also did not have a hospital bed or a lift and his mother who is his primary caretaker was unable to manage. He will discharge with multiple home health services including PT, OT, social work, RN visits as well as needed equipment. It is our recommendation that if he returns to the emergency department and has no clear cut acute medical issue that requires hospitalization and his only complaint is that he cannot manage at home that he should be returned back to home or referred directly to [facility] since he has repeatedly told us he would not go to a ventilator skilled nursing facility outside of [facility] (our only other option for d/c at this time).  CKD stage III  Has remained stable during the admission  Chronic diastolic heart failure  Has remained compensated  Hx PE  INR was subtherapeutic at presentation. Pharmacy is managing warfarin dosing. INR 1.91 on date of discharge. He is to continue Previous warfarin followup as he was doing prior to admission.  Chronic hypercarbic resp failure / Morbid obesity-OHS / Sleep apnea s/p trach /  chronic nocturnal vent support  PCCM assist with ventilator management during the hospitalization. He will continue with respiratory therapy services after discharge on same ventilator settings: PRVC mode, set rate 14, VT 600 and, PEEP 5  DM w/ renal manifestations  Increased lantus to 28 units during this admission. Hemoglobin A1c 7.3.  HTN  Has remained stable and has not required adjustments in medications   HLD  Has remained stable  OA knees  Likely contributory factor in patient's disability and deconditioning. Obesity primary causative factor. Weight reduction recommended and discussed as previous.   Procedures:  None  Consultations:  PCCM  Discharge Exam: Filed Vitals:   09/29/13 1114  BP: 102/55  Pulse: 65  Temp: 97.9 F (36.6 C)  Resp: 17   General: No acute respiratory distress  Lungs: Clear to auscultation bilaterally without wheezes or crackles on trach collar with room air Cardiovascular: Regular rate and rhythm without murmur gallop or rub normal S1 and S2  Abdomen: Morbidly obese, Nontender, nondistended, soft, bowel sounds positive, no rebound, no ascites, no appreciable mass  Extremities: Bilateral lower extremity venous stasis ulcers which are healing well (eschar over both), No significant cyanosis, clubbing, or edema bilateral lower extremities   Current Discharge Medication List    START taking these medications   Details  docusate sodium 100 MG CAPS Take 100 mg by mouth 2 (two) times daily. Qty: 10 capsule, Refills: 0      CONTINUE these medications which have CHANGED   Details  insulin glargine (LANTUS) 100 UNIT/ML injection Inject 0.28 mLs (28 Units total) into the skin every morning. Qty: 10 mL, Refills: 11      CONTINUE these medications which have NOT CHANGED   Details  acetaminophen (TYLENOL) 650 MG CR tablet Take 1,300 mg by mouth every 8 (eight) hours as needed for pain.    alprazolam (XANAX) 2 MG tablet Take 2 tablets (4 mg  total) by mouth at bedtime. Qty: 60 tablet, Refills: 0    amLODipine (NORVASC) 10 MG tablet Take 1 tablet (10 mg total) by mouth daily. Qty: 30 tablet, Refills: 1    carvedilol (COREG) 6.25 MG tablet Take 6.25 mg by mouth 2 (two) times daily with a meal.    citalopram (CELEXA) 20 MG tablet Take 1 tablet (20 mg total) by mouth daily. Qty: 30 tablet, Refills: 4    cloNIDine (CATAPRES) 0.1 MG tablet Take 0.1 mg by mouth 2 (two) times daily.     furosemide (LASIX) 20 MG tablet Take 3 tablets (60 mg total) by mouth daily. Qty: 180 tablet, Refills: 0    losartan (COZAAR) 100 MG tablet Take 0.5 tablets (50 mg total) by mouth daily.    metoCLOPramide (REGLAN) 5 MG tablet Take 5 mg by mouth 3 (three) times daily.    Multiple Vitamin (MULTIVITAMIN) tablet Take 1 tablet by mouth daily.     oxyCODONE 10 MG TABS Take 1 tablet (10 mg total) by mouth every 6 (six) hours as needed for severe pain. Qty: 30 tablet, Refills: 0    polyethylene glycol (MIRALAX / GLYCOLAX) packet Take 17 g by mouth daily. Qty: 14 each, Refills: 0    potassium chloride SA (K-DUR,KLOR-CON) 20 MEQ tablet Take 1  tablet (20 mEq total) by mouth daily.    senna-docusate (SENOKOT-S) 8.6-50 MG per tablet Take 1 tablet by mouth 2 (two) times daily.    simvastatin (ZOCOR) 20 MG tablet Take 20 mg by mouth daily.    warfarin (COUMADIN) 5 MG tablet Take 1.5 tablets (7.5 mg total) by mouth daily. Qty: 45 tablet, Refills: 0       No Known Allergies Follow-up Information   Schedule an appointment as soon as possible for a visit with Renato Shin, MD.   Specialty:  Endocrinology   Contact information:   [address] [employer] [place] [address] [phone]       The results of significant diagnostics from this hospitalization (including imaging, microbiology, ancillary and laboratory) are listed below for reference.    Significant Diagnostic Studies: Dg Chest 2 View  09/24/2013   CLINICAL DATA:  Fall.   Hypertension.  EXAM: CHEST  2 VIEW  COMPARISON:  08/03/2013.  FINDINGS: Tracheostomy in stable position. Stable cardiomegaly and pulmonary vascular congestion. No focal infiltrates identified. Costophrenic angles not imaged. No pleural effusion however noted. No pneumothorax. No acute bony abnormality.  IMPRESSION: 1. Tracheostomy in stable position. 2. Stable cardiomegaly and pulmonary venous congestion.   Electronically Signed   By: Marcello Moores  Register   On: 09/24/2013 16:14   Dg Pelvis 1-2 Views  09/24/2013   CLINICAL DATA:  Bilateral hip pain after a fall.  EXAM: PELVIS - 1-2 VIEW  COMPARISON:  CT pelvis 12/29/2008  FINDINGS: Technically limited study due to patient's body habitus resulting in underpenetration of the image. There is no evidence of pelvic fracture or diastasis. No other pelvic bone lesions are seen. Surgical clips in the low pelvis.  IMPRESSION: No displaced fractures identified.   Electronically Signed   By: Lucienne Capers M.D.   On: 09/24/2013 21:21   Ct Head Wo Contrast  09/24/2013   CLINICAL DATA:  Fall.  EXAM: CT HEAD WITHOUT CONTRAST  TECHNIQUE: Contiguous axial images were obtained from the base of the skull through the vertex without intravenous contrast.  COMPARISON:  None.  FINDINGS: Prominence of the sulci and ventricles noted compatible with brain atrophy. There is mild low attenuation within the subcortical and periventricular white matter compatible with chronic microvascular disease. There is no evidence for acute brain infarct, intracranial hemorrhage or mass. The paranasal sinuses and the mastoid air cells are clear. The skull is intact.  IMPRESSION: No acute intracranial abnormalities.  Small vessel ischemic disease and atrophy.   Electronically Signed   By: Kerby Moors M.D.   On: 09/24/2013 20:06   Dg Knee Complete 4 Views Left  09/24/2013   CLINICAL DATA:  Bilateral knee pain after a fall.  EXAM: LEFT KNEE - COMPLETE 4+ VIEW  COMPARISON:  03/24/2013  FINDINGS:  Prominent degenerative changes in the left knee involving all 3 compartments. Medial compartment narrowing and hypertrophic changes predominate. Chondrocalcinosis is present. Heterotopic calcification throughout the suprapatellar and posterior spaces. 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 The cortex appears intact. No displaced fractures identified.  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 No change since previous study. No acute displaced fractures.   Electronically Signed   By: Lucienne Capers M.D.   On: 09/24/2013 21:23   Dg Knee Complete 4 Views Right  09/24/2013   CLINICAL DATA:  Pain post trauma  EXAM: RIGHT KNEE - COMPLETE 4+ VIEW  COMPARISON:  March 24, 2013.  FINDINGS: Frontal, lateral, and  bilateral oblique views were obtained. There is no fracture or dislocation. There are multiple calcifications in the suprapatellar bursa. A frank joint effusion is not seen at this time. There is advanced osteoarthritic change with narrowing in all compartments, most marked medially. There is spurring in all compartments.  IMPRESSION: Advanced osteoarthritis, most marked medially. 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 No acute fracture or dislocation.   Electronically Signed   By: Lowella Grip M.D.   On: 09/24/2013 21:24    Microbiology: Recent Results (from the past 240 hour(s))  MRSA PCR SCREENING     Status: None   Collection Time    09/25/13  1:15 AM      Result Value Ref Range Status   MRSA by PCR NEGATIVE  NEGATIVE Final   Comment:            The GeneXpert MRSA Assay (FDA     approved for NASAL specimens     only), is one component of a     comprehensive MRSA colonization     surveillance program. It is not     intended to diagnose MRSA     infection nor to guide or     monitor treatment for     MRSA infections.      Labs: Basic Metabolic Panel:  Recent Labs Lab 09/24/13 1701 09/25/13 0353 09/29/13 0450  NA 141 139 138  K 4.2 3.7 3.4*  CL 103 103 101  CO2 26 27 24   GLUCOSE 115* 182* 128*  BUN 17 16 22   CREATININE 1.40* 1.39* 1.64*  CALCIUM 9.2 8.5 8.4  MG  --  2.1  --   PHOS  --  2.4  --    Liver Function Tests:  Recent Labs Lab 09/24/13 1701 09/25/13 0353  AST 16 12  ALT 10 8  ALKPHOS 78 68  BILITOT 0.3 0.3  PROT 7.3 6.4  ALBUMIN 3.3* 2.8*   CBC:  Recent Labs Lab 09/24/13 1701 09/25/13 0353 09/26/13 0320 09/29/13 0450  WBC 6.4 5.1 5.1 5.0  HGB 11.6* 10.3* 10.7* 10.3*  HCT 36.0* 31.9* 31.9* 31.0*  MCV 85.7 84.4 82.4 82.0  PLT 182 183 173 171   BNP (last 3 results)  Recent Labs  08/01/13 0154 08/03/13 1600 09/24/13 1701  PROBNP 265.8* 844.9* 7.4   CBG:  Recent Labs Lab 09/28/13 1206 09/28/13 1550 09/28/13 2210 09/29/13 0751 09/29/13 1116  GLUCAP 169* 141* 222* 129* 203*   Signed:  ELLIS,ALLISON L.  [facility] 09/29/2013, 1:07 PM  I have personally examined this patient and reviewed the entire database. I have reviewed the above note, made any necessary editorial changes, and agree with its content.  Cherene Altes, MD [facility]

## 2013-09-29 NOTE — Progress Notes (Signed)
[facility] for coumadin  Indication: hx PE  No Known Allergies  Patient Measurements: Height: 6\' 2"  (188 cm) Weight: 349 lb 6.9 oz (158.5 kg) IBW/kg (Calculated) : 82.2  Vital Signs: Temp: 97.6 F (36.4 C) (09/14 0749) Temp src: Oral (09/14 0749) BP: 142/83 mmHg (09/14 0819) Pulse Rate: 61 (09/14 0819)  Labs:  Recent Labs  09/27/13 0520 09/28/13 0704 09/29/13 0450  HGB  --   --  10.3*  HCT  --   --  31.0*  PLT  --   --  171  LABPROT 21.4* 20.9* 21.9*  INR 1.86* 1.80* 1.91*  CREATININE  --   --  1.64*    Estimated Creatinine Clearance: 69.7 ml/min (by C-G formula based on Cr of 1.64).  Assessment: 68 YOM chronic respiratory failure, chronic trach and noctrunal vent dependent presented with AMS and fall at home with no head involvement. With history on PE and continued on warfarin- PTA dose: 7.5mg  daily. INR up to 1.91 with a good trends toward goal. Hgb low but stable, plts good, no bleeding noted.  Goal of Therapy:  INR 2-3 Monitor platelets by anticoagulation protocol: Yes   Plan:  1. Warfarin 10mg  po x1 tonight 2. daily PT/INR  Legrand Como, Pharm.D., BCPS, AAHIVP Clinical Pharmacist Phone: [phone] or [phone] 09/29/2013, 10:46 AM

## 2013-09-29 NOTE — Progress Notes (Signed)
Physical Therapy Treatment Patient Details Name: Travis Chapman MRN: [id number] DOB: [birth year] Today's Date: 09/29/2013    History of Present Illness Pt adm with weakness, deconditioning and incr falls. PMHx-Long term trach - on vent at night. Morbid obesity, DM, CHF, severe arthritis of knees with frequent falls and 6 admissions since 2/15 for falls    PT Comments    Pt agreeable to try ambulation, but indicates Bil knee pain and that knees will buckle without warning.  Had 2nd person present for safety and close chair follow.  Will continue to follow.    Follow Up Recommendations  LTACH     Equipment Recommendations  None recommended by PT    Recommendations for Other Services       Precautions / Restrictions Precautions Precautions: Fall Precaution Comments: obese, blind left eye, trach Restrictions Weight Bearing Restrictions: No    Mobility  Bed Mobility               General bed mobility comments: pt sitting in recliner.    Transfers Overall transfer level: Needs assistance Equipment used: Rolling walker (2 wheeled) Transfers: Sit to/from Stand Sit to Stand: Min assist         General transfer comment: cues for use of UEs and controlling descent to sitting as pt tends to "fall" back in chair.    Ambulation/Gait Ambulation/Gait assistance: Min guard;+2 safety/equipment Ambulation Distance (Feet): 10 Feet (and 3) Assistive device: Rolling walker (2 wheeled) Gait Pattern/deviations: Step-through pattern;Decreased stride length;Trunk flexed     General Gait Details: cues for positioning within RW, upright posture, and encouragement.  pt indicates pain in Bil knees and that his knees can buckle without warning.  2nd person present for close chair follow.  pt only able to amb 3' during second time 2/2 Bil knee pain.     Stairs            Wheelchair Mobility    Modified Rankin (Stroke Patients Only)       Balance Overall balance assessment:  Needs assistance Sitting-balance support: No upper extremity supported;Feet supported Sitting balance-Leahy Scale: Good     Standing balance support: Bilateral upper extremity supported Standing balance-Leahy Scale: Poor                      Cognition Arousal/Alertness: Awake/alert Behavior During Therapy: WFL for tasks assessed/performed Overall Cognitive Status: Within Functional Limits for tasks assessed                      Exercises      General Comments        Pertinent Vitals/Pain Pain Assessment: Faces Faces Pain Scale: Hurts even more Pain Location: Bil knees after ambulation Pain Descriptors / Indicators: Stabbing Pain Intervention(s): Premedicated before session;Repositioned    Home Living                      Prior Function            PT Goals (current goals can now be found in the care plan section) Acute Rehab PT Goals Patient Stated Goal: return home PT Goal Formulation: With patient Time For Goal Achievement: 10/09/13 Potential to Achieve Goals: Fair Progress towards PT goals: Progressing toward goals    Frequency  Min 3X/week    PT Plan Current plan remains appropriate    Co-evaluation             End of Session Equipment Utilized During  Treatment: Gait belt Activity Tolerance: Patient limited by pain;Patient limited by fatigue Patient left: in chair;with call bell/phone within reach     Time: 0951-1008 PT Time Calculation (min): 17 min  Charges:  $Gait Training: 8-22 mins                    G Codes:      Travis Chapman F 2013-10-01, 11:06 AM

## 2013-09-30 NOTE — Telephone Encounter (Signed)
Patient has been in and out of the hospital, and want to get physical Therapy for patient. Call Randall Hiss from [facility], [phone]

## 2013-09-30 NOTE — Telephone Encounter (Signed)
[facility] advising that pt would have to been before any verbal orders could be given.

## 2013-10-14 ENCOUNTER — Ambulatory Visit (INDEPENDENT_AMBULATORY_CARE_PROVIDER_SITE_OTHER): Payer: Medicare Other | Admitting: Endocrinology

## 2013-10-14 ENCOUNTER — Encounter: Payer: Self-pay | Admitting: Endocrinology

## 2013-10-14 VITALS — BP 132/78 | HR 69 | Temp 98.8°F

## 2013-10-14 DIAGNOSIS — R5381 Other malaise: Secondary | ICD-10-CM

## 2013-10-14 DIAGNOSIS — Y92009 Unspecified place in unspecified non-institutional (private) residence as the place of occurrence of the external cause: Secondary | ICD-10-CM

## 2013-10-14 DIAGNOSIS — R531 Weakness: Secondary | ICD-10-CM

## 2013-10-14 DIAGNOSIS — J9610 Chronic respiratory failure, unspecified whether with hypoxia or hypercapnia: Secondary | ICD-10-CM

## 2013-10-14 DIAGNOSIS — Z5189 Encounter for other specified aftercare: Secondary | ICD-10-CM

## 2013-10-14 DIAGNOSIS — E1165 Type 2 diabetes mellitus with hyperglycemia: Secondary | ICD-10-CM

## 2013-10-14 DIAGNOSIS — I2699 Other pulmonary embolism without acute cor pulmonale: Secondary | ICD-10-CM

## 2013-10-14 DIAGNOSIS — T17408A Unspecified foreign body in trachea causing other injury, initial encounter: Secondary | ICD-10-CM

## 2013-10-14 DIAGNOSIS — I129 Hypertensive chronic kidney disease with stage 1 through stage 4 chronic kidney disease, or unspecified chronic kidney disease: Secondary | ICD-10-CM

## 2013-10-14 DIAGNOSIS — R5383 Other fatigue: Secondary | ICD-10-CM

## 2013-10-14 DIAGNOSIS — Z430 Encounter for attention to tracheostomy: Secondary | ICD-10-CM

## 2013-10-14 DIAGNOSIS — W19XXXD Unspecified fall, subsequent encounter: Secondary | ICD-10-CM

## 2013-10-14 DIAGNOSIS — J9622 Acute and chronic respiratory failure with hypercapnia: Secondary | ICD-10-CM

## 2013-10-14 DIAGNOSIS — J9620 Acute and chronic respiratory failure, unspecified whether with hypoxia or hypercapnia: Secondary | ICD-10-CM

## 2013-10-14 DIAGNOSIS — IMO0001 Reserved for inherently not codable concepts without codable children: Secondary | ICD-10-CM

## 2013-10-14 DIAGNOSIS — W19XXXA Unspecified fall, initial encounter: Secondary | ICD-10-CM

## 2013-10-14 DIAGNOSIS — E662 Morbid (severe) obesity with alveolar hypoventilation: Secondary | ICD-10-CM

## 2013-10-14 MED ORDER — OXYCODONE HCL 10 MG PO TABS: 10.0000 mg | ORAL_TABLET | Freq: Four times a day (QID) | ORAL | Status: DC | PRN

## 2013-10-14 NOTE — Patient Instructions (Addendum)
The hospital says they cannot help you with the falling problem.   Please continue your physical and occupational therapy.  i have requested these.   Please ask you physical therapist about qualifying for the scooter.   Please come back for a follow-up appointment in January.  Please continue the same medications for now.

## 2013-10-14 NOTE — Progress Notes (Signed)
Subjective:    Patient ID: Travis Chapman, male    DOB: 06/02/1945, 68 y.o.   MRN: [id number]  HPI The state of at least three ongoing medical problems is addressed today, with interval history of each noted here: Pt returns for f/u of diabetes mellitus:  DM type: insulin-requiring type 2 Dx'ed: 3295 Complications: associated nephropathy Therapy: insulin  DKA: never Severe hypoglycemia: never Pancreatitis: never Other: he needs a simple regimen, as he was unwilling to take multiple daily injections.    Interval history:    no cbg record, but states cbg's are well-controlled.   Falls: No more since hosp d/c.  Renal failure: pt says he has generalized weakness, and needs a power scooter.  Past Medical History  Diagnosis Date  . ANXIETY 07/23/2006  . ASTHMA 07/23/2006  . DM 05/23/2007  . HYPERTENSION 07/23/2006  . OSTEOARTHRITIS 07/23/2006  . HYPERCHOLESTEROLEMIA 05/23/2007  . ANEMIA 08/31/2008  . Palpitations 02/10/2008  . ALCOHOL ABUSE, IN REMISSION, HX OF 08/26/2008  . TOBACCO ABUSE, HX OF 08/26/2008  . Pulmonary embolism 03/16/2010  . ED (erectile dysfunction)   . Morbid obesity   . DM retinopathy   . 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 DM neuropathy with neurologic complication   . CHF (congestive heart failure)     With a preserved EF  . 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 Sleep apnea   . Shortness of breath   . CARCINOMA, PROSTATE 09/29/2009    Past Surgical History  Procedure Laterality Date  . Back surgery    . Tracheostomy      History   Social History  . Marital Status: Divorced    Spouse Name: N/A    Number of Children: N/A  . Years of Education: N/A   Occupational History  . Disabled    Social History Main Topics  . Smoking status: Former Smoker -- 0.50 packs/day for 15 years    Types: Cigarettes    Quit date: 01/16/2005  . Smokeless tobacco: Not on file  . Alcohol  Use: No  . Drug Use: No  . Sexual Activity: Not on file   Other Topics Concern  . Not on file   Social History Narrative   Single   Lives with his elderly mother          Current Outpatient Prescriptions on File Prior to Visit  Medication Sig Dispense Refill  . acetaminophen (TYLENOL) 650 MG CR tablet Take 1,300 mg by mouth every 8 (eight) hours as needed for pain.      Marland Kitchen alprazolam (XANAX) 2 MG tablet Take 2 tablets (4 mg total) by mouth at bedtime.  60 tablet  0  . amLODipine (NORVASC) 10 MG tablet Take 1 tablet (10 mg total) by mouth daily.  30 tablet  1  . carvedilol (COREG) 6.25 MG tablet Take 6.25 mg by mouth 2 (two) times daily with a meal.      . citalopram (CELEXA) 20 MG tablet Take 1 tablet (20 mg total) by mouth daily.  30 tablet  4  . cloNIDine (CATAPRES) 0.1 MG tablet Take 0.1 mg by mouth 2 (two) times daily.       Marland Kitchen docusate sodium 100 MG CAPS Take 100 mg by mouth 2 (two) times daily.  10 capsule  0  . furosemide (LASIX) 20 MG tablet Take 3 tablets (60 mg total)  by mouth daily.  180 tablet  0  . insulin glargine (LANTUS) 100 UNIT/ML injection Inject 0.28 mLs (28 Units total) into the skin every morning.  10 mL  11  . losartan (COZAAR) 100 MG tablet Take 0.5 tablets (50 mg total) by mouth daily.      . metoCLOPramide (REGLAN) 5 MG tablet Take 5 mg by mouth 3 (three) times daily.      . Multiple Vitamin (MULTIVITAMIN) tablet Take 1 tablet by mouth daily.       . polyethylene glycol (MIRALAX / GLYCOLAX) packet Take 17 g by mouth daily.  14 each  0  . potassium chloride SA (K-DUR,KLOR-CON) 20 MEQ tablet Take 1 tablet (20 mEq total) by mouth daily.      Marland Kitchen senna-docusate (SENOKOT-S) 8.6-50 MG per tablet Take 1 tablet by mouth 2 (two) times daily.      . simvastatin (ZOCOR) 20 MG tablet Take 20 mg by mouth daily.      Marland Kitchen warfarin (COUMADIN) 5 MG tablet Take 1.5 tablets (7.5 mg total) by mouth daily.  45 tablet  0   No current facility-administered medications on file prior to  visit.   No Known Allergies  Family History  Problem Relation Age of Onset  . Diabetes Mellitus II Father   . CAD Father   . Cancer Other    BP 132/78  Pulse 69  Temp(Src) 98.8 F (37.1 C) (Oral)  SpO2 95%  Review of Systems He denies hypoglycemia.  He has lost a few lbs.     Objective:   Physical Exam VITAL SIGNS:  See vs page.   GENERAL: no distress.  Morbid obesity.  In wheelchair.  UE's; motor is 5/5 Neuro: sensation is intact to touch on the UE's.  Pulses: dorsalis pedis intact bilat.   Feet: no deformity.  1+ bilat leg edema.  Long toenails.  Healed ulcer at the left ant tib area. Skin:  no ulcer on the feet.  normal color and temp. Neuro: sensation is intact to touch on the feet  Lab Results  Component Value Date   HGBA1C 7.3* 09/25/2013   Lab Results  Component Value Date   WBC 5.0 09/29/2013   HGB 10.3* 09/29/2013   HCT 31.0* 09/29/2013   MCV 82.0 09/29/2013   PLT 171 09/29/2013   Lab Results  Component Value Date   CREATININE 1.64* 09/29/2013   BUN 22 09/29/2013   NA 138 09/29/2013   K 3.4* 09/29/2013   CL 101 09/29/2013   CO2 24 09/29/2013       Assessment & Plan:  DM: this is the best control this pt should aim for, given this regimen, which does match insulin to his changing needs throughout the day.  Falls, recurrent.   Renal insuff, stable in the hospital.  No weakness of the UE's is found on phys exam  Patient is advised the following: Patient Instructions  The hospital says they cannot help you with the falling problem.   Please continue your physical and occupational therapy.  i have requested these.   Please ask you physical therapist about qualifying for the scooter.   Please come back for a follow-up appointment in January.  Please continue the same medications for now.

## 2013-10-16 ENCOUNTER — Telehealth: Payer: Self-pay | Admitting: Endocrinology

## 2013-10-16 NOTE — Telephone Encounter (Signed)
Orders faxed unable to contact pt to advise.

## 2013-10-16 NOTE — Telephone Encounter (Signed)
Has approval been approval been made for patient physical and occupational therapy.

## 2013-10-22 ENCOUNTER — Telehealth: Payer: Self-pay

## 2013-10-22 NOTE — Telephone Encounter (Signed)
Pt stated that he has not had a recently eye exam, but that he typically goes to Dr. Renaldo Harrison office--[facility].  At this time he is not sure of whether or not he will schedule an appointment.

## 2013-10-23 ENCOUNTER — Telehealth: Payer: Self-pay | Admitting: *Deleted

## 2013-10-24 NOTE — Telephone Encounter (Signed)
Form has already been completed. First fax was sent on 10/1 second was sent today. How should we proceed with pt being week and not being able to get out of bed. Pt's daughter is concerned.  Thanks!

## 2013-10-24 NOTE — Telephone Encounter (Signed)
Ok, please send form, and i'll complete

## 2013-10-24 NOTE — Telephone Encounter (Signed)
Pt's sister advised. Form faxed to [facility].

## 2013-10-24 NOTE — Telephone Encounter (Signed)
Pt's daughter calling stating he has not been able to move Wednesday night at 11 he is very weak, cant move his legs, swollen in the face. [facility] sent letter letting pt know there is nothing else they can do for him per daughter. What is he to do?

## 2013-10-24 NOTE — Telephone Encounter (Signed)
Pt did fall out of the bed last night please call dawn daughter # [phone]

## 2013-10-24 NOTE — Telephone Encounter (Signed)
This is a difficult situation: Options are to assist pt with bed to chair transfers, or return for [facility]. i'll sign the equipment form

## 2013-10-24 NOTE — Telephone Encounter (Signed)
See below. I spoke with pt's daughter and she states the pt has been in his bed room since Wednesday.  Thanks!

## 2013-10-30 ENCOUNTER — Ambulatory Visit (INDEPENDENT_AMBULATORY_CARE_PROVIDER_SITE_OTHER): Payer: Medicare Other | Admitting: *Deleted

## 2013-10-30 DIAGNOSIS — I2699 Other pulmonary embolism without acute cor pulmonale: Secondary | ICD-10-CM

## 2013-10-30 DIAGNOSIS — Z5181 Encounter for therapeutic drug level monitoring: Secondary | ICD-10-CM

## 2013-10-30 LAB — POCT INR: INR: 1.9

## 2013-11-03 ENCOUNTER — Telehealth: Payer: Self-pay

## 2013-11-03 ENCOUNTER — Other Ambulatory Visit: Payer: Self-pay | Admitting: Endocrinology

## 2013-11-03 NOTE — Telephone Encounter (Signed)
Occupational therapist [facility] called requesting verbal orders for continuation of home care. Orders are 1 visit for 4 weeks. subjects are energy conservation, home exercise for upper extremities, and shower transfer.     Please advise if ok for verbal orders. Thanks!

## 2013-11-03 NOTE — Telephone Encounter (Signed)
ok 

## 2013-11-04 NOTE — Telephone Encounter (Signed)
Occupational therapist advised.

## 2013-11-04 NOTE — Telephone Encounter (Signed)
Please advise if ok to refill. Medication is listed under historical provider.  Thanks!  

## 2013-11-05 ENCOUNTER — Telehealth: Payer: Self-pay | Admitting: Endocrinology

## 2013-11-05 NOTE — Telephone Encounter (Signed)
Sent back to [facility]

## 2013-11-06 NOTE — Telephone Encounter (Signed)
error 

## 2013-11-12 ENCOUNTER — Telehealth: Payer: Self-pay

## 2013-11-12 NOTE — Telephone Encounter (Signed)
[facility] from [facility] called requesting verbal to continue pt for 2 visits for 2 weeks.  [place] for verbal? Thanks!

## 2013-11-12 NOTE — Telephone Encounter (Signed)
ok 

## 2013-11-13 NOTE — Telephone Encounter (Signed)
PT, Travis Chapman with [facility] advised of below.

## 2013-11-24 ENCOUNTER — Telehealth: Payer: Self-pay

## 2013-11-24 ENCOUNTER — Encounter: Payer: Self-pay | Admitting: Endocrinology

## 2013-11-24 MED ORDER — METOCLOPRAMIDE HCL 5 MG PO TABS: 5.0000 mg | ORAL_TABLET | Freq: Three times a day (TID) | ORAL | Status: DC

## 2013-11-24 NOTE — Telephone Encounter (Signed)
Received a refill request for Reglan 5 mg. Med is listed under historical provider.  Please advise if ok to refill Thanks!

## 2013-11-24 NOTE — Telephone Encounter (Signed)
Please refill prn 

## 2013-11-24 NOTE — Telephone Encounter (Signed)
Rx sent to pharmacy   

## 2013-11-25 ENCOUNTER — Telehealth: Payer: Self-pay

## 2013-11-25 MED ORDER — ALPRAZOLAM 2 MG PO TABS: ORAL_TABLET | ORAL | Status: DC

## 2013-11-25 NOTE — Telephone Encounter (Signed)
Pt called requesting that a medication be called to his pharmacy to help him sleep. Pt states the xanax that had been prescribed has not been helping at all. Please advise, Thanks!

## 2013-11-25 NOTE — Telephone Encounter (Signed)
Rx faxed to pharmacy  

## 2013-11-25 NOTE — Telephone Encounter (Signed)
Please try increasing the alprazolam to 3 pills at bedtime.  Do you need a new prescription?

## 2013-11-25 NOTE — Telephone Encounter (Signed)
Pt advised of below and he states that he would need a new rx to be sent to his pharmacy.

## 2013-11-25 NOTE — Telephone Encounter (Signed)
Ok, i printed 

## 2013-11-28 ENCOUNTER — Other Ambulatory Visit: Payer: Self-pay | Admitting: Endocrinology

## 2013-12-02 ENCOUNTER — Inpatient Hospital Stay (HOSPITAL_COMMUNITY)
Admission: EM | Admit: 2013-12-02 | Discharge: 2013-12-17 | DRG: 948 | Disposition: A | Discharge status: Skilled Nursing Facility | Payer: Medicare Other | Attending: Internal Medicine | Admitting: Internal Medicine

## 2013-12-02 ENCOUNTER — Encounter (HOSPITAL_COMMUNITY): Payer: Self-pay | Admitting: Emergency Medicine

## 2013-12-02 ENCOUNTER — Emergency Department (HOSPITAL_COMMUNITY): Payer: Medicare Other

## 2013-12-02 DIAGNOSIS — G4733 Obstructive sleep apnea (adult) (pediatric): Secondary | ICD-10-CM

## 2013-12-02 DIAGNOSIS — Z6841 Body Mass Index (BMI) 40.0 and over, adult: Secondary | ICD-10-CM | POA: Diagnosis not present

## 2013-12-02 DIAGNOSIS — I129 Hypertensive chronic kidney disease with stage 1 through stage 4 chronic kidney disease, or unspecified chronic kidney disease: Secondary | ICD-10-CM | POA: Diagnosis not present

## 2013-12-02 DIAGNOSIS — Y92099 Unspecified place in other non-institutional residence as the place of occurrence of the external cause: Secondary | ICD-10-CM

## 2013-12-02 DIAGNOSIS — Z930 Tracheostomy status: Secondary | ICD-10-CM | POA: Diagnosis not present

## 2013-12-02 DIAGNOSIS — E1122 Type 2 diabetes mellitus with diabetic chronic kidney disease: Secondary | ICD-10-CM | POA: Diagnosis not present

## 2013-12-02 DIAGNOSIS — Z794 Long term (current) use of insulin: Secondary | ICD-10-CM | POA: Diagnosis not present

## 2013-12-02 DIAGNOSIS — J9612 Chronic respiratory failure with hypercapnia: Secondary | ICD-10-CM

## 2013-12-02 DIAGNOSIS — K3184 Gastroparesis: Secondary | ICD-10-CM | POA: Diagnosis not present

## 2013-12-02 DIAGNOSIS — Z79899 Other long term (current) drug therapy: Secondary | ICD-10-CM

## 2013-12-02 DIAGNOSIS — Z86711 Personal history of pulmonary embolism: Secondary | ICD-10-CM

## 2013-12-02 DIAGNOSIS — E662 Morbid (severe) obesity with alveolar hypoventilation: Secondary | ICD-10-CM | POA: Diagnosis not present

## 2013-12-02 DIAGNOSIS — Z79891 Long term (current) use of opiate analgesic: Secondary | ICD-10-CM | POA: Diagnosis not present

## 2013-12-02 DIAGNOSIS — D649 Anemia, unspecified: Secondary | ICD-10-CM | POA: Diagnosis present

## 2013-12-02 DIAGNOSIS — E1143 Type 2 diabetes mellitus with diabetic autonomic (poly)neuropathy: Secondary | ICD-10-CM | POA: Diagnosis present

## 2013-12-02 DIAGNOSIS — Z66 Do not resuscitate: Secondary | ICD-10-CM | POA: Diagnosis not present

## 2013-12-02 DIAGNOSIS — W19XXXA Unspecified fall, initial encounter: Secondary | ICD-10-CM | POA: Diagnosis not present

## 2013-12-02 DIAGNOSIS — J9611 Chronic respiratory failure with hypoxia: Secondary | ICD-10-CM | POA: Diagnosis present

## 2013-12-02 DIAGNOSIS — E872 Acidosis: Secondary | ICD-10-CM | POA: Diagnosis present

## 2013-12-02 DIAGNOSIS — Z7901 Long term (current) use of anticoagulants: Secondary | ICD-10-CM | POA: Diagnosis not present

## 2013-12-02 DIAGNOSIS — R5381 Other malaise: Secondary | ICD-10-CM | POA: Diagnosis present

## 2013-12-02 DIAGNOSIS — R296 Repeated falls: Secondary | ICD-10-CM | POA: Diagnosis not present

## 2013-12-02 DIAGNOSIS — G4730 Sleep apnea, unspecified: Secondary | ICD-10-CM | POA: Diagnosis present

## 2013-12-02 DIAGNOSIS — D689 Coagulation defect, unspecified: Secondary | ICD-10-CM | POA: Diagnosis not present

## 2013-12-02 DIAGNOSIS — Z87891 Personal history of nicotine dependence: Secondary | ICD-10-CM | POA: Diagnosis not present

## 2013-12-02 DIAGNOSIS — R531 Weakness: Principal | ICD-10-CM

## 2013-12-02 DIAGNOSIS — I248 Other forms of acute ischemic heart disease: Secondary | ICD-10-CM | POA: Diagnosis present

## 2013-12-02 DIAGNOSIS — I5042 Chronic combined systolic (congestive) and diastolic (congestive) heart failure: Secondary | ICD-10-CM | POA: Diagnosis present

## 2013-12-02 DIAGNOSIS — Y92009 Unspecified place in unspecified non-institutional (private) residence as the place of occurrence of the external cause: Secondary | ICD-10-CM | POA: Diagnosis not present

## 2013-12-02 DIAGNOSIS — I5032 Chronic diastolic (congestive) heart failure: Secondary | ICD-10-CM | POA: Diagnosis present

## 2013-12-02 DIAGNOSIS — R0600 Dyspnea, unspecified: Secondary | ICD-10-CM

## 2013-12-02 DIAGNOSIS — F419 Anxiety disorder, unspecified: Secondary | ICD-10-CM | POA: Diagnosis not present

## 2013-12-02 DIAGNOSIS — J9622 Acute and chronic respiratory failure with hypercapnia: Secondary | ICD-10-CM

## 2013-12-02 DIAGNOSIS — Z9911 Dependence on respirator [ventilator] status: Secondary | ICD-10-CM

## 2013-12-02 DIAGNOSIS — N183 Chronic kidney disease, stage 3 unspecified: Secondary | ICD-10-CM | POA: Insufficient documentation

## 2013-12-02 DIAGNOSIS — M1990 Unspecified osteoarthritis, unspecified site: Secondary | ICD-10-CM | POA: Diagnosis not present

## 2013-12-02 DIAGNOSIS — E780 Pure hypercholesterolemia: Secondary | ICD-10-CM | POA: Diagnosis not present

## 2013-12-02 DIAGNOSIS — K5900 Constipation, unspecified: Secondary | ICD-10-CM | POA: Diagnosis not present

## 2013-12-02 DIAGNOSIS — M25561 Pain in right knee: Secondary | ICD-10-CM | POA: Diagnosis present

## 2013-12-02 DIAGNOSIS — M170 Bilateral primary osteoarthritis of knee: Secondary | ICD-10-CM | POA: Diagnosis not present

## 2013-12-02 DIAGNOSIS — J45909 Unspecified asthma, uncomplicated: Secondary | ICD-10-CM | POA: Diagnosis present

## 2013-12-02 DIAGNOSIS — R4182 Altered mental status, unspecified: Secondary | ICD-10-CM | POA: Diagnosis present

## 2013-12-02 DIAGNOSIS — H5442 Blindness, left eye, normal vision right eye: Secondary | ICD-10-CM | POA: Diagnosis not present

## 2013-12-02 DIAGNOSIS — M25562 Pain in left knee: Secondary | ICD-10-CM | POA: Diagnosis not present

## 2013-12-02 DIAGNOSIS — I10 Essential (primary) hypertension: Secondary | ICD-10-CM | POA: Diagnosis present

## 2013-12-02 LAB — URINE MICROSCOPIC-ADD ON

## 2013-12-02 LAB — CBC WITH DIFFERENTIAL/PLATELET
BASOS PCT: 0 % (ref 0–1)
Basophils Absolute: 0 10*3/uL (ref 0.0–0.1)
Eosinophils Absolute: 0 10*3/uL (ref 0.0–0.7)
Eosinophils Relative: 0 % (ref 0–5)
HCT: 35.4 % — ABNORMAL LOW (ref 39.0–52.0)
HEMOGLOBIN: 11.6 g/dL — AB (ref 13.0–17.0)
LYMPHS PCT: 12 % (ref 12–46)
Lymphs Abs: 0.9 10*3/uL (ref 0.7–4.0)
MCH: 27.9 pg (ref 26.0–34.0)
MCHC: 32.8 g/dL (ref 30.0–36.0)
MCV: 85.1 fL (ref 78.0–100.0)
MONOS PCT: 5 % (ref 3–12)
Monocytes Absolute: 0.3 10*3/uL (ref 0.1–1.0)
NEUTROS ABS: 6 10*3/uL (ref 1.7–7.7)
NEUTROS PCT: 83 % — AB (ref 43–77)
Platelets: 192 10*3/uL (ref 150–400)
RBC: 4.16 MIL/uL — ABNORMAL LOW (ref 4.22–5.81)
RDW: 13.4 % (ref 11.5–15.5)
WBC: 7.3 10*3/uL (ref 4.0–10.5)

## 2013-12-02 LAB — RAPID URINE DRUG SCREEN, HOSP PERFORMED
Amphetamines: NOT DETECTED
BARBITURATES: NOT DETECTED
BENZODIAZEPINES: POSITIVE — AB
COCAINE: NOT DETECTED
OPIATES: NOT DETECTED
Tetrahydrocannabinol: NOT DETECTED

## 2013-12-02 LAB — I-STAT CG4 LACTIC ACID, ED: Lactic Acid, Venous: 1.4 mmol/L (ref 0.5–2.2)

## 2013-12-02 LAB — COMPREHENSIVE METABOLIC PANEL
ALBUMIN: 3.1 g/dL — AB (ref 3.5–5.2)
ALK PHOS: 70 U/L (ref 39–117)
ALT: 11 U/L (ref 0–53)
AST: 22 U/L (ref 0–37)
Anion gap: 9 (ref 5–15)
BUN: 21 mg/dL (ref 6–23)
CHLORIDE: 102 meq/L (ref 96–112)
CO2: 27 meq/L (ref 19–32)
Calcium: 9.3 mg/dL (ref 8.4–10.5)
Creatinine, Ser: 1.54 mg/dL — ABNORMAL HIGH (ref 0.50–1.35)
GFR calc Af Amer: 52 mL/min — ABNORMAL LOW (ref >90)
GFR calc non Af Amer: 45 mL/min — ABNORMAL LOW (ref >90)
Glucose, Bld: 183 mg/dL — ABNORMAL HIGH (ref 70–99)
POTASSIUM: 4.9 meq/L (ref 3.7–5.3)
Sodium: 138 mEq/L (ref 137–147)
Total Bilirubin: 0.5 mg/dL (ref 0.3–1.2)
Total Protein: 7.3 g/dL (ref 6.0–8.3)

## 2013-12-02 LAB — I-STAT ARTERIAL BLOOD GAS, ED
ACID-BASE EXCESS: 3 mmol/L — AB (ref 0.0–2.0)
Bicarbonate: 28.9 mEq/L — ABNORMAL HIGH (ref 20.0–24.0)
O2 SAT: 94 %
PCO2 ART: 50.8 mmHg — AB (ref 35.0–45.0)
Patient temperature: 98.6
TCO2: 30 mmol/L (ref 0–100)
pH, Arterial: 7.364 (ref 7.350–7.450)
pO2, Arterial: 73 mmHg — ABNORMAL LOW (ref 80.0–100.0)

## 2013-12-02 LAB — MAGNESIUM: Magnesium: 2.3 mg/dL (ref 1.5–2.5)

## 2013-12-02 LAB — URINALYSIS, ROUTINE W REFLEX MICROSCOPIC
Bilirubin Urine: NEGATIVE
Glucose, UA: 100 mg/dL — AB
KETONES UR: NEGATIVE mg/dL
Leukocytes, UA: NEGATIVE
NITRITE: NEGATIVE
PH: 6.5 (ref 5.0–8.0)
Protein, ur: 100 mg/dL — AB
Specific Gravity, Urine: 1.02 (ref 1.005–1.030)
Urobilinogen, UA: 0.2 mg/dL (ref 0.0–1.0)

## 2013-12-02 LAB — CK: CK TOTAL: 370 U/L — AB (ref 7–232)

## 2013-12-02 LAB — AMMONIA: Ammonia: 31 umol/L (ref 11–60)

## 2013-12-02 LAB — PROTIME-INR
INR: 1.62 — ABNORMAL HIGH (ref 0.00–1.49)
PROTHROMBIN TIME: 19.4 s — AB (ref 11.6–15.2)

## 2013-12-02 LAB — LIPASE, BLOOD: Lipase: 16 U/L (ref 11–59)

## 2013-12-02 LAB — TROPONIN I: Troponin I: <0.3 ng/mL (ref <0.30)

## 2013-12-02 LAB — PHOSPHORUS: PHOSPHORUS: 3.4 mg/dL (ref 2.3–4.6)

## 2013-12-02 MED ORDER — AMLODIPINE BESYLATE 10 MG PO TABS
10.0000 mg | ORAL_TABLET | Freq: Every day | ORAL | Status: DC
  Administered 2013-12-03 – 2013-12-17 (×15): 10 mg via ORAL
  Filled 2013-12-02 (×17): qty 1

## 2013-12-02 MED ORDER — ACETAMINOPHEN 650 MG RE SUPP: 650.0000 mg | Freq: Four times a day (QID) | RECTAL | Status: DC | PRN

## 2013-12-02 MED ORDER — INSULIN ASPART 100 UNIT/ML ~~LOC~~ SOLN
0.0000 [IU] | Freq: Three times a day (TID) | SUBCUTANEOUS | Status: DC
  Administered 2013-12-03 (×2): 2 [IU] via SUBCUTANEOUS
  Administered 2013-12-03: 3 [IU] via SUBCUTANEOUS
  Administered 2013-12-04 (×3): 2 [IU] via SUBCUTANEOUS
  Administered 2013-12-05 – 2013-12-06 (×3): 3 [IU] via SUBCUTANEOUS
  Administered 2013-12-06: 2 [IU] via SUBCUTANEOUS
  Administered 2013-12-07: 3 [IU] via SUBCUTANEOUS
  Administered 2013-12-07 – 2013-12-08 (×4): 2 [IU] via SUBCUTANEOUS
  Administered 2013-12-09 (×2): 3 [IU] via SUBCUTANEOUS
  Administered 2013-12-10: 2 [IU] via SUBCUTANEOUS
  Administered 2013-12-10 – 2013-12-11 (×3): 3 [IU] via SUBCUTANEOUS
  Administered 2013-12-11 – 2013-12-13 (×6): 2 [IU] via SUBCUTANEOUS
  Administered 2013-12-13: 3 [IU] via SUBCUTANEOUS
  Administered 2013-12-13: 2 [IU] via SUBCUTANEOUS
  Administered 2013-12-14: 3 [IU] via SUBCUTANEOUS
  Administered 2013-12-14 – 2013-12-16 (×4): 2 [IU] via SUBCUTANEOUS

## 2013-12-02 MED ORDER — ALPRAZOLAM 0.5 MG PO TABS
2.0000 mg | ORAL_TABLET | Freq: Every evening | ORAL | Status: DC | PRN
  Administered 2013-12-03 – 2013-12-16 (×15): 2 mg via ORAL
  Filled 2013-12-02 (×15): qty 4

## 2013-12-02 MED ORDER — POLYETHYLENE GLYCOL 3350 17 G PO PACK
17.0000 g | PACK | Freq: Every day | ORAL | Status: DC
  Administered 2013-12-10 – 2013-12-12 (×3): 17 g via ORAL
  Filled 2013-12-02 (×15): qty 1

## 2013-12-02 MED ORDER — METOCLOPRAMIDE HCL 5 MG PO TABS
5.0000 mg | ORAL_TABLET | Freq: Three times a day (TID) | ORAL | Status: DC
  Administered 2013-12-03 – 2013-12-17 (×44): 5 mg via ORAL
  Filled 2013-12-02 (×46): qty 1

## 2013-12-02 MED ORDER — FUROSEMIDE 40 MG PO TABS
60.0000 mg | ORAL_TABLET | Freq: Every day | ORAL | Status: DC
  Administered 2013-12-03 – 2013-12-16 (×14): 60 mg via ORAL
  Filled 2013-12-02 (×15): qty 1

## 2013-12-02 MED ORDER — INSULIN GLARGINE 100 UNIT/ML ~~LOC~~ SOLN
28.0000 [IU] | Freq: Every morning | SUBCUTANEOUS | Status: DC
  Administered 2013-12-03 – 2013-12-17 (×15): 28 [IU] via SUBCUTANEOUS
  Filled 2013-12-02 (×15): qty 0.28

## 2013-12-02 MED ORDER — CLONIDINE HCL 0.1 MG PO TABS
0.1000 mg | ORAL_TABLET | Freq: Two times a day (BID) | ORAL | Status: DC
  Administered 2013-12-03 – 2013-12-17 (×30): 0.1 mg via ORAL
  Filled 2013-12-02 (×31): qty 1

## 2013-12-02 MED ORDER — ONDANSETRON HCL 4 MG/2ML IJ SOLN: 4.0000 mg | Freq: Four times a day (QID) | INTRAMUSCULAR | Status: DC | PRN

## 2013-12-02 MED ORDER — INSULIN ASPART 100 UNIT/ML ~~LOC~~ SOLN
0.0000 [IU] | Freq: Every day | SUBCUTANEOUS | Status: DC
  Administered 2013-12-06 – 2013-12-14 (×2): 2 [IU] via SUBCUTANEOUS

## 2013-12-02 MED ORDER — CARVEDILOL 6.25 MG PO TABS
6.2500 mg | ORAL_TABLET | Freq: Two times a day (BID) | ORAL | Status: DC
  Administered 2013-12-03 – 2013-12-17 (×29): 6.25 mg via ORAL
  Filled 2013-12-02 (×33): qty 1

## 2013-12-02 MED ORDER — OXYCODONE HCL 5 MG PO TABS
10.0000 mg | ORAL_TABLET | ORAL | Status: DC | PRN
  Administered 2013-12-03 – 2013-12-17 (×38): 10 mg via ORAL
  Filled 2013-12-02 (×38): qty 2

## 2013-12-02 MED ORDER — SENNOSIDES-DOCUSATE SODIUM 8.6-50 MG PO TABS
1.0000 | ORAL_TABLET | Freq: Every evening | ORAL | Status: DC | PRN
  Administered 2013-12-14: 1 via ORAL
  Filled 2013-12-02: qty 1

## 2013-12-02 MED ORDER — ONDANSETRON HCL 4 MG PO TABS: 4.0000 mg | ORAL_TABLET | Freq: Four times a day (QID) | ORAL | Status: DC | PRN

## 2013-12-02 MED ORDER — SENNOSIDES-DOCUSATE SODIUM 8.6-50 MG PO TABS
1.0000 | ORAL_TABLET | Freq: Two times a day (BID) | ORAL | Status: DC
  Administered 2013-12-03 – 2013-12-16 (×24): 1 via ORAL
  Filled 2013-12-02 (×31): qty 1

## 2013-12-02 MED ORDER — ACETAMINOPHEN 325 MG PO TABS
650.0000 mg | ORAL_TABLET | Freq: Four times a day (QID) | ORAL | Status: DC | PRN
  Administered 2013-12-04 – 2013-12-17 (×16): 650 mg via ORAL
  Filled 2013-12-02 (×16): qty 2

## 2013-12-02 MED ORDER — CITALOPRAM HYDROBROMIDE 20 MG PO TABS
20.0000 mg | ORAL_TABLET | Freq: Every day | ORAL | Status: DC
  Administered 2013-12-03 – 2013-12-17 (×15): 20 mg via ORAL
  Filled 2013-12-02 (×15): qty 1

## 2013-12-02 MED ORDER — POTASSIUM CHLORIDE CRYS ER 20 MEQ PO TBCR
40.0000 meq | EXTENDED_RELEASE_TABLET | Freq: Every day | ORAL | Status: DC
  Administered 2013-12-03 – 2013-12-17 (×15): 40 meq via ORAL
  Filled 2013-12-02 (×15): qty 2

## 2013-12-02 MED ORDER — HEPARIN SODIUM (PORCINE) 5000 UNIT/ML IJ SOLN
5000.0000 [IU] | Freq: Three times a day (TID) | INTRAMUSCULAR | Status: DC
  Administered 2013-12-03 – 2013-12-04 (×6): 5000 [IU] via SUBCUTANEOUS
  Filled 2013-12-02 (×8): qty 1

## 2013-12-02 MED ORDER — SODIUM CHLORIDE 0.9 % IJ SOLN
3.0000 mL | Freq: Two times a day (BID) | INTRAMUSCULAR | Status: DC
  Administered 2013-12-02 – 2013-12-14 (×23): 3 mL via INTRAVENOUS

## 2013-12-02 MED ORDER — LOSARTAN POTASSIUM 50 MG PO TABS
50.0000 mg | ORAL_TABLET | Freq: Every day | ORAL | Status: DC
  Administered 2013-12-03 – 2013-12-17 (×15): 50 mg via ORAL
  Filled 2013-12-02 (×15): qty 1

## 2013-12-02 MED ORDER — DOCUSATE SODIUM 100 MG PO CAPS
100.0000 mg | ORAL_CAPSULE | Freq: Two times a day (BID) | ORAL | Status: DC
  Administered 2013-12-03 – 2013-12-07 (×9): 100 mg via ORAL
  Filled 2013-12-02 (×12): qty 1

## 2013-12-02 NOTE — Progress Notes (Addendum)
ANTICOAGULATION CONSULT NOTE - Initial Consult  Pharmacy Consult for Coumadin Indication: pulmonary embolus  No Known Allergies  Patient Measurements:    Vital Signs: Temp: 98.7 F (37.1 C) (11/17 2321) Temp Source: Oral (11/17 2321) BP: 176/91 mmHg (11/17 2321) Pulse Rate: 85 (11/17 2321)  Labs:  Recent Labs  12/02/13 1707 12/02/13 2125  HGB 11.6*  --   HCT 35.4*  --   PLT 192  --   LABPROT  --  19.4*  INR  --  1.62*  CREATININE 1.54*  --   CKTOTAL  --  370*  TROPONINI <0.30  --     CrCl cannot be calculated (Unknown ideal weight.).   Medical History: Past Medical History  Diagnosis Date  . ANXIETY 07/23/2006  . ASTHMA 07/23/2006  . DM 05/23/2007  . HYPERTENSION 07/23/2006  . OSTEOARTHRITIS 07/23/2006  . HYPERCHOLESTEROLEMIA 05/23/2007  . ANEMIA 08/31/2008  . Palpitations 02/10/2008  . ALCOHOL ABUSE, IN REMISSION, HX OF 08/26/2008  . TOBACCO ABUSE, HX OF 08/26/2008  . Pulmonary embolism 03/16/2010  . ED (erectile dysfunction)   . Morbid obesity   . DM retinopathy   . 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 DM neuropathy with neurologic complication   . CHF (congestive heart failure)     With a preserved EF  . 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 Sleep apnea   . Shortness of breath   . CARCINOMA, PROSTATE 09/29/2009    Medications:  Prescriptions prior to admission  Medication Sig Dispense Refill Last Dose  . acetaminophen (TYLENOL) 650 MG CR tablet Take 1,300 mg by mouth See admin instructions. Takes two tablets every night at bedtime. Can take daily as needed too.   12/01/2013 at Unknown time  . alprazolam (XANAX) 2 MG tablet 3 tabs at bedtime 90 tablet 3 12/01/2013 at Unknown time  . insulin glargine (LANTUS) 100 UNIT/ML injection Inject 0.28 mLs (28 Units total) into the skin every morning. 10 mL 11 12/01/2013 at Unknown time  . warfarin (COUMADIN) 10 MG tablet Take 10 mg by  mouth daily.     Marland Kitchen warfarin (COUMADIN) 5 MG tablet Take 1.5 tablets (7.5 mg total) by mouth daily. 45 tablet 0 Taking  . amLODipine (NORVASC) 10 MG tablet Take 1 tablet (10 mg total) by mouth daily. 30 tablet 1 Taking  . carvedilol (COREG) 6.25 MG tablet Take 6.25 mg by mouth 2 (two) times daily with a meal.   Taking  . citalopram (CELEXA) 20 MG tablet Take 1 tablet (20 mg total) by mouth daily. 30 tablet 4 Taking  . cloNIDine (CATAPRES) 0.1 MG tablet Take 0.1 mg by mouth 2 (two) times daily.    Taking  . docusate sodium 100 MG CAPS Take 100 mg by mouth 2 (two) times daily. 10 capsule 0 Taking  . furosemide (LASIX) 20 MG tablet Take 3 tablets (60 mg total) by mouth daily. 180 tablet 0 Taking  . KLOR-CON M20 20 MEQ tablet TAKE TWO TABLETS BY MOUTH ONCE DAILY 60 tablet 0   . losartan (COZAAR) 100 MG tablet Take 0.5 tablets (50 mg total) by mouth daily.   Taking  . metoCLOPramide (REGLAN) 5 MG tablet Take 1 tablet (5 mg total) by mouth 3 (three) times daily. 90 tablet 1   . Multiple Vitamin (MULTIVITAMIN) tablet Take 1 tablet by mouth daily.    Taking  . Oxycodone  HCl 10 MG TABS Take 1 tablet (10 mg total) by mouth every 6 (six) hours as needed. 100 tablet 0   . polyethylene glycol (MIRALAX / GLYCOLAX) packet Take 17 g by mouth daily. 14 each 0 Taking  . potassium chloride SA (K-DUR,KLOR-CON) 20 MEQ tablet Take 1 tablet (20 mEq total) by mouth daily.   Taking  . senna-docusate (SENOKOT-S) 8.6-50 MG per tablet Take 1 tablet by mouth 2 (two) times daily.   Taking  . simvastatin (ZOCOR) 20 MG tablet Take 20 mg by mouth daily.   Taking  . simvastatin (ZOCOR) 20 MG tablet TAKE ONE TABLET BY MOUTH ONCE DAILY 90 tablet 0     Assessment: 68 yo male admitted s/p fall, h/o PE, to continue Coumadin.  Pt not sure of details of Coumadin regimen, buts she takes either 7.5 or 10 mg each day.  INR today subtherapeutic.  Goal of Therapy:  INR 2-3 Monitor platelets by anticoagulation protocol: Yes   Plan:   Coumadin 10 mg tonight Daily INR F/U in AM with family for current  Coumadin regimen  Clevie Prout, Bronson Curb 12/02/2013,11:58 PM

## 2013-12-02 NOTE — ED Notes (Signed)
Pt incontinent, condom cath placed.

## 2013-12-02 NOTE — ED Notes (Signed)
Pt fell 3 x in last 24 hours, most recent one this morning, pt was found on bathroom floor. Pt believes it's due to his weak legs. Pt has trach, able to put finger over it and talk. Pt was soiled when he was found by EMS.

## 2013-12-02 NOTE — ED Notes (Signed)
RN attempted to draw labs/cultures. Unsuccessful. Phlebotomy notified

## 2013-12-02 NOTE — ED Provider Notes (Signed)
CSN: [id number]     Arrival date & time 12/02/13  1422 History   First MD Initiated Contact with Patient 12/02/13 1509     Chief Complaint  Patient presents with  . Fall     (Consider location/radiation/quality/duration/timing/severity/associated sxs/prior Treatment) HPI Comments: Patient presents to the ER for evaluation of multiple falls. Patient reportedly fell 3 times in the last 24 hours. Patient reports that he is feeling very weak and his legs are giving out causing the falls. She denies any injury from the falls. He is, however, somnolent upon arrival. He does not answer all questions and platelet before he falls asleep again. Level V Caveat due to mental status change.   Patient is a 68 y.o. male presenting with fall.  Fall    Past Medical History  Diagnosis Date  . ANXIETY 07/23/2006  . ASTHMA 07/23/2006  . DM 05/23/2007  . HYPERTENSION 07/23/2006  . OSTEOARTHRITIS 07/23/2006  . HYPERCHOLESTEROLEMIA 05/23/2007  . ANEMIA 08/31/2008  . Palpitations 02/10/2008  . ALCOHOL ABUSE, IN REMISSION, HX OF 08/26/2008  . TOBACCO ABUSE, HX OF 08/26/2008  . Pulmonary embolism 03/16/2010  . ED (erectile dysfunction)   . Morbid obesity   . DM retinopathy   . 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 DM neuropathy with neurologic complication   . CHF (congestive heart failure)     With a preserved EF  . 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 Sleep apnea   . Shortness of breath   . CARCINOMA, PROSTATE 09/29/2009   Past Surgical History  Procedure Laterality Date  . Back surgery    . Tracheostomy     Family History  Problem Relation Age of Onset  . Diabetes Mellitus II Father   . CAD Father   . Cancer Other    History  Substance Use Topics  . Smoking status: Former Smoker -- 0.50 packs/day for 15 years    Types: Cigarettes    Quit date: 01/16/2005  . Smokeless tobacco: Not on file  . Alcohol Use: No     Review of Systems  Unable to perform ROS: Mental status change      Allergies  Review of patient's allergies indicates no known allergies.  Home Medications   Prior to Admission medications   Medication Sig Start Date End Date Taking? Authorizing Provider  acetaminophen (TYLENOL) 650 MG CR tablet Take 1,300 mg by mouth every 8 (eight) hours as needed for pain.    Historical Provider, MD  alprazolam Duanne Moron) 2 MG tablet 3 tabs at bedtime 11/25/13   Renato Shin, MD  amLODipine (NORVASC) 10 MG tablet Take 1 tablet (10 mg total) by mouth daily. 07/04/13   Renato Shin, MD  carvedilol (COREG) 6.25 MG tablet Take 6.25 mg by mouth 2 (two) times daily with a meal.    Historical Provider, MD  citalopram (CELEXA) 20 MG tablet Take 1 tablet (20 mg total) by mouth daily. 04/30/13   Cherene Altes, MD  cloNIDine (CATAPRES) 0.1 MG tablet Take 0.1 mg by mouth 2 (two) times daily.  02/12/13   Renato Shin, MD  docusate sodium 100 MG CAPS Take 100 mg by mouth 2 (two) times daily. 09/29/13   Samella Parr, NP  furosemide (LASIX) 20 MG tablet Take 3 tablets (60 mg total) by mouth daily. 07/07/13   Janece Canterbury, MD  insulin glargine (LANTUS) 100 UNIT/ML  injection Inject 0.28 mLs (28 Units total) into the skin every morning. 09/29/13   Samella Parr, NP  KLOR-CON M20 20 MEQ tablet TAKE TWO TABLETS BY MOUTH ONCE DAILY 12/01/13   Renato Shin, MD  losartan (COZAAR) 100 MG tablet Take 0.5 tablets (50 mg total) by mouth daily. 08/11/13   Cherene Altes, MD  metoCLOPramide (REGLAN) 5 MG tablet Take 1 tablet (5 mg total) by mouth 3 (three) times daily. 11/24/13   Renato Shin, MD  Multiple Vitamin (MULTIVITAMIN) tablet Take 1 tablet by mouth daily.     Historical Provider, MD  Oxycodone HCl 10 MG TABS Take 1 tablet (10 mg total) by mouth every 6 (six) hours as needed. 10/14/13   Renato Shin, MD  polyethylene glycol (MIRALAX / GLYCOLAX) packet Take 17 g by mouth daily. 08/11/13   Cherene Altes, MD   potassium chloride SA (K-DUR,KLOR-CON) 20 MEQ tablet Take 1 tablet (20 mEq total) by mouth daily. 08/11/13   Cherene Altes, MD  senna-docusate (SENOKOT-S) 8.6-50 MG per tablet Take 1 tablet by mouth 2 (two) times daily. 08/11/13   Cherene Altes, MD  simvastatin (ZOCOR) 20 MG tablet Take 20 mg by mouth daily.    Historical Provider, MD  simvastatin (ZOCOR) 20 MG tablet TAKE ONE TABLET BY MOUTH ONCE DAILY 11/04/13   Renato Shin, MD  warfarin (COUMADIN) 5 MG tablet Take 1.5 tablets (7.5 mg total) by mouth daily. 08/02/13   Donita Brooks, NP   BP 172/88 mmHg  Pulse 73  Temp(Src) 98.8 F (37.1 C) (Core (Comment))  Resp 26  SpO2 100% Physical Exam  Constitutional: He appears well-developed and well-nourished. No distress.  HENT:  Head: Normocephalic and atraumatic.  Right Ear: Hearing normal.  Left Ear: Hearing normal.  Nose: Nose normal.  Mouth/Throat: Oropharynx is clear and moist and mucous membranes are normal.  Eyes: Conjunctivae and EOM are normal. Pupils are equal, round, and reactive to light.  Neck: Normal range of motion. Neck supple.  Cardiovascular: Regular rhythm, S1 normal and S2 normal.  Exam reveals no gallop and no friction rub.   No murmur heard. Pulmonary/Chest: Tachypnea noted. He has decreased breath sounds. He has wheezes. He exhibits no tenderness.  Abdominal: Soft. Normal appearance and bowel sounds are normal. There is no hepatosplenomegaly. There is no tenderness. There is no rebound, no guarding, no tenderness at McBurney's point and negative Murphy's sign. No hernia.  Musculoskeletal: Normal range of motion.  Neurological: He is alert. He has normal strength. No cranial nerve deficit or sensory deficit. Coordination normal. GCS eye subscore is 4. GCS verbal subscore is 4. GCS motor subscore is 6.  Skin: Skin is warm, dry and intact. No rash noted. No cyanosis.  Psychiatric: He has a normal mood and affect.  Nursing note and vitals reviewed.   ED Course   Procedures (including critical care time) Labs Review Labs Reviewed  CBC WITH DIFFERENTIAL - Abnormal; Notable for the following:    RBC 4.16 (*)    Hemoglobin 11.6 (*)    HCT 35.4 (*)    Neutrophils Relative % 83 (*)    All other components within normal limits  COMPREHENSIVE METABOLIC PANEL - Abnormal; Notable for the following:    Glucose, Bld 183 (*)    Creatinine, Ser 1.54 (*)    Albumin 3.1 (*)    GFR calc non Af Amer 45 (*)    GFR calc Af Amer 52 (*)    All other components within  normal limits  URINALYSIS, ROUTINE W REFLEX MICROSCOPIC - Abnormal; Notable for the following:    APPearance HAZY (*)    Glucose, UA 100 (*)    Hgb urine dipstick SMALL (*)    Protein, ur 100 (*)    All other components within normal limits  URINE RAPID DRUG SCREEN (HOSP PERFORMED) - Abnormal; Notable for the following:    Benzodiazepines POSITIVE (*)    All other components within normal limits  URINE MICROSCOPIC-ADD ON - Abnormal; Notable for the following:    Casts HYALINE CASTS (*)    All other components within normal limits  I-STAT ARTERIAL BLOOD GAS, ED - Abnormal; Notable for the following:    pCO2 arterial 50.8 (*)    pO2, Arterial 73.0 (*)    Bicarbonate 28.9 (*)    Acid-Base Excess 3.0 (*)    All other components within normal limits  URINE CULTURE  CULTURE, BLOOD (ROUTINE X 2)  CULTURE, BLOOD (ROUTINE X 2)  CULTURE, EXPECTORATED SPUTUM-ASSESSMENT  LIPASE, BLOOD  TROPONIN I  AMMONIA  PRO B NATRIURETIC PEPTIDE  PROTIME-INR  CK  PHOSPHORUS  MAGNESIUM  TSH  VITAMIN B12  FOLATE  I-STAT CG4 LACTIC ACID, ED    Imaging Review Ct Head Wo Contrast  12/02/2013   CLINICAL DATA:  Mental status change.  EXAM: CT HEAD WITHOUT CONTRAST  TECHNIQUE: Contiguous axial images were obtained from the base of the skull through the vertex without intravenous contrast.  COMPARISON:  CT 08/25/1998  FINDINGS: No acute intracranial hemorrhage. No focal mass lesion. No CT evidence of acute  infarction. No midline shift or mass effect. No hydrocephalus. Generalized cortical atrophy. Basilar cisterns are patent. Paranasal sinuses and mastoid air cells are clear. Right globe prosthetic  IMPRESSION: 1. No acute intracranial findings. 2. Generalized cortical.   Electronically Signed   By: Suzy Bouchard M.D.   On: 12/02/2013 18:04   Dg Chest Port 1 View  12/02/2013   CLINICAL DATA:  Fever. Unable to give history. History of hypertension and asthma. Patient has a tracheostomy.  EXAM: PORTABLE CHEST - 1 VIEW  COMPARISON:  09/24/2013  FINDINGS: Left lung base not completely imaged on this exam. Allowing for this, lungs are clear. No pneumothorax. Cardiac silhouette is mildly enlarged. No mediastinal or hilar masses. Tracheostomy tube is stable and well positioned.  IMPRESSION: No acute cardiopulmonary disease.   Electronically Signed   By: Lajean Manes M.D.   On: 12/02/2013 16:38     EKG Interpretation   Date/Time:  Tuesday December 02 2013 14:31:23 EST Ventricular Rate:  79 PR Interval:  208 QRS Duration: 108 QT Interval:  401 QTC Calculation: 460 R Axis:   -60 Text Interpretation:  Sinus rhythm Left anterior fascicular block Probable  anterior infarct, age indeterminate No significant change since last  tracing Confirmed by POLLINA  MD, [place] [phone]) on 12/02/2013  5:00:31 PM      MDM   Final diagnoses:  Mental status change  Acute on chronic respiratory failure with hypercapnia    Patient presents to the ER for evaluation of weakness and frequent falls. Patient has a history ofobesity hypoventilation syndrome resulting in chronic respiratory failure with intermittent acute respiratory failure episodes. Patient was somnolent upon arrival to the ER. I was concerned about significant CO2 retention based on this. Blood gas shows mild hypoxia and mild CO2 retention. Remainder of his workup was unremarkable. I did perform a CT scan of his head to rule out injury from falls  as well  as stroke and other symptoms causing his mental status changes. No acute findings were noted. Chest x-ray did not show any evidence of pneumonia. Patient does have significant sputum production, possible tracheitis. Culture has been sent. Blood and urine cultures also pending. Patient is unable to sit up by himself in the bed secondary to weakness. He will require hospitalization for further management.    Orpah Greek, MD 12/02/13 2126

## 2013-12-02 NOTE — ED Notes (Signed)
Pt. is arousable to voice @ this time, ABG results shown to Dr. Betsey Holiday, RT to monitor.

## 2013-12-02 NOTE — H&P (Signed)
[facility] History and Physical  Patient: Travis Chapman  PRF:[id number]  DOB: February 21, 1945  DOS: the patient was seen and examined on 12/02/2013 PCP: Renato Shin, MD  Chief Complaint: generalized weakness and fall  HPI: Travis Chapman is a 68 y.o. male with Past medical history of obesity hypoventilation syndrome status post tracheostomy and on ventilator overnight, diabetes mellitus with diabetic neuropathy. Chronic diastolic heart failure, hypertension, physical deconditioning, anemia. The patient is presenting with 3 falls in the last 3 days. Lives at home with his daughter. He was recently hospitalized in September with similar situation and at that point was recommended to be discharged at [facility] but he decided to go home instead and works with home health and home physical therapy. He has been working with home health and home physical therapy throughout the month of October without any significant improvement. Last week he started having recurrent fall in the fall he describes are due to generalized weakness of bilateral lower legs. Patient denies any focal deficit head injury and neck injury a pleasant episode. Patient denies any chest pain or tachycardia. Patient mentions he is taking all his medications regularly and there is no recent change in his medication. Patient also denies any complaint of fever, chills, nausea, vomiting, abdominal pain, diarrhea, burning urination.  The patient is coming from home And at his baseline independent for most of his ADL.  Review of Systems: as mentioned in the history of present illness.  A Comprehensive review of the other systems is negative.  Past Medical History  Diagnosis Date  . ANXIETY 07/23/2006  . ASTHMA 07/23/2006  . DM 05/23/2007  . HYPERTENSION 07/23/2006  . OSTEOARTHRITIS 07/23/2006  . HYPERCHOLESTEROLEMIA 05/23/2007  . ANEMIA 08/31/2008  . Palpitations 02/10/2008  . ALCOHOL ABUSE, IN REMISSION, HX OF 08/26/2008  . TOBACCO ABUSE, HX OF  08/26/2008  . Pulmonary embolism 03/16/2010  . ED (erectile dysfunction)   . Morbid obesity   . DM retinopathy   . 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 DM neuropathy with neurologic complication   . CHF (congestive heart failure)     With a preserved EF  . 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 Sleep apnea   . Shortness of breath   . CARCINOMA, PROSTATE 09/29/2009   Past Surgical History  Procedure Laterality Date  . Back surgery    . Tracheostomy     Social History:  reports that he quit smoking about 8 years ago. His smoking use included Cigarettes. He has a 7.5 pack-year smoking history. He does not have any smokeless tobacco history on file. He reports that he does not drink alcohol or use illicit drugs.  No Known Allergies  Family History  Problem Relation Age of Onset  . Diabetes Mellitus II Father   . CAD Father   . Cancer Other     Prior to Admission medications   Medication Sig Start Date End Date Taking? Authorizing Provider  acetaminophen (TYLENOL) 650 MG CR tablet Take 1,300 mg by mouth See admin instructions. Takes two tablets every night at bedtime. Can take daily as needed too.   Yes Historical Provider, MD  alprazolam Duanne Moron) 2 MG tablet 3 tabs at bedtime 11/25/13  Yes Renato Shin, MD  insulin glargine (LANTUS) 100 UNIT/ML injection Inject 0.28 mLs (28 Units total) into the skin every morning. 09/29/13  Yes Samella Parr, NP  warfarin (COUMADIN) 10 MG tablet Take 10 mg by mouth daily.   Yes Historical Provider, MD  warfarin (COUMADIN) 5 MG tablet Take 1.5 tablets (7.5 mg total) by mouth daily. 08/02/13  Yes Donita Brooks, NP  amLODipine (NORVASC) 10 MG tablet Take 1 tablet (10 mg total) by mouth daily. 07/04/13   Renato Shin, MD  carvedilol (COREG) 6.25 MG tablet Take 6.25 mg by mouth 2 (two) times daily with a meal.    Historical Provider, MD  citalopram (CELEXA) 20 MG tablet  Take 1 tablet (20 mg total) by mouth daily. 04/30/13   Cherene Altes, MD  cloNIDine (CATAPRES) 0.1 MG tablet Take 0.1 mg by mouth 2 (two) times daily.  02/12/13   Renato Shin, MD  docusate sodium 100 MG CAPS Take 100 mg by mouth 2 (two) times daily. 09/29/13   Samella Parr, NP  furosemide (LASIX) 20 MG tablet Take 3 tablets (60 mg total) by mouth daily. 07/07/13   Janece Canterbury, MD  KLOR-CON M20 20 MEQ tablet TAKE TWO TABLETS BY MOUTH ONCE DAILY 12/01/13   Renato Shin, MD  losartan (COZAAR) 100 MG tablet Take 0.5 tablets (50 mg total) by mouth daily. 08/11/13   Cherene Altes, MD  metoCLOPramide (REGLAN) 5 MG tablet Take 1 tablet (5 mg total) by mouth 3 (three) times daily. 11/24/13   Renato Shin, MD  Multiple Vitamin (MULTIVITAMIN) tablet Take 1 tablet by mouth daily.     Historical Provider, MD  Oxycodone HCl 10 MG TABS Take 1 tablet (10 mg total) by mouth every 6 (six) hours as needed. 10/14/13   Renato Shin, MD  polyethylene glycol (MIRALAX / GLYCOLAX) packet Take 17 g by mouth daily. 08/11/13   Cherene Altes, MD  potassium chloride SA (K-DUR,KLOR-CON) 20 MEQ tablet Take 1 tablet (20 mEq total) by mouth daily. 08/11/13   Cherene Altes, MD  senna-docusate (SENOKOT-S) 8.6-50 MG per tablet Take 1 tablet by mouth 2 (two) times daily. 08/11/13   Cherene Altes, MD  simvastatin (ZOCOR) 20 MG tablet Take 20 mg by mouth daily.    Historical Provider, MD  simvastatin (ZOCOR) 20 MG tablet TAKE ONE TABLET BY MOUTH ONCE DAILY 11/04/13   Renato Shin, MD    Physical Exam: Filed Vitals:   12/02/13 2045 12/02/13 2215 12/02/13 2230 12/02/13 2245  BP: 115/64 166/79 158/72 169/84  Pulse: 80 79 74 75  Temp: 99.3 F (37.4 C) 99.1 F (37.3 C) 99.3 F (37.4 C) 99.3 F (37.4 C)  TempSrc:      Resp: 15 23 12 16   SpO2: 100% 100% 98% 100%    General: Alert, Awake and Oriented to Time, Place and Person. Appear in mild distress Eyes: PERRL ENT: Oral Mucosa clear moist. Neck: difficult to  assess JVD Cardiovascular: S1 and S2 Present, no Murmur, Peripheral Pulses Present Respiratory: Bilateral Air entry equal and Decreased, no Crackles, occasional wheezes Abdomen: Bowel Sound present, Soft and non tender Skin: no Rash Extremities: bilateral Pedal edema, non calf tenderness Neurologic: Grossly no focal neuro deficit.  Labs on Admission:  CBC:  Recent Labs Lab 12/02/13 1707  WBC 7.3  NEUTROABS 6.0  HGB 11.6*  HCT 35.4*  MCV 85.1  PLT 192    CMP     Component Value Date/Time   NA 138 12/02/2013 1707   K 4.9 12/02/2013 1707   CL 102 12/02/2013 1707   CO2 27 12/02/2013 1707   GLUCOSE 183* 12/02/2013 1707   BUN 21  12/02/2013 1707   CREATININE 1.54* 12/02/2013 1707   CALCIUM 9.3 12/02/2013 1707   PROT 7.3 12/02/2013 1707   ALBUMIN 3.1* 12/02/2013 1707   AST 22 12/02/2013 1707   ALT 11 12/02/2013 1707   ALKPHOS 70 12/02/2013 1707   BILITOT 0.5 12/02/2013 1707   GFRNONAA 45* 12/02/2013 1707   GFRAA 52* 12/02/2013 1707     Recent Labs Lab 12/02/13 1707  LIPASE 16    Recent Labs Lab 12/02/13 1707  AMMONIA 31     Recent Labs Lab 12/02/13 1707 12/02/13 2125  CKTOTAL  --  370*  TROPONINI <0.30  --    BNP (last 3 results)  Recent Labs  08/01/13 0154 08/03/13 1600 09/24/13 1701  PROBNP 265.8* 844.9* 7.4    Radiological Exams on Admission: Ct Head Wo Contrast  12/02/2013   CLINICAL DATA:  Mental status change.  EXAM: CT HEAD WITHOUT CONTRAST  TECHNIQUE: Contiguous axial images were obtained from the base of the skull through the vertex without intravenous contrast.  COMPARISON:  CT 08/25/1998  FINDINGS: No acute intracranial hemorrhage. No focal mass lesion. No CT evidence of acute infarction. No midline shift or mass effect. No hydrocephalus. Generalized cortical atrophy. Basilar cisterns are patent. Paranasal sinuses and mastoid air cells are clear. Right globe prosthetic  IMPRESSION: 1. No acute intracranial findings. 2. Generalized  cortical.   Electronically Signed   By: Suzy Bouchard M.D.   On: 12/02/2013 18:04   Dg Chest Port 1 View  12/02/2013   CLINICAL DATA:  Fever. Unable to give history. History of hypertension and asthma. Patient has a tracheostomy.  EXAM: PORTABLE CHEST - 1 VIEW  COMPARISON:  09/24/2013  FINDINGS: Left lung base not completely imaged on this exam. Allowing for this, lungs are clear. No pneumothorax. Cardiac silhouette is mildly enlarged. No mediastinal or hilar masses. Tracheostomy tube is stable and well positioned.  IMPRESSION: No acute cardiopulmonary disease.   Electronically Signed   By: Lajean Manes M.D.   On: 12/02/2013 16:38    EKG: Independently reviewed. normal sinus rhythm, nonspecific ST and T waves changes.  Assessment/Plan Principal Problem:   Generalized weakness Active Problems:   Essential hypertension   Chronic diastolic HF (heart failure)   Ventilator dependence   Obesity hypoventilation syndrome   Diabetic gastroparesis   Physical deconditioning   Fall at home   Tracheostomy in place   1. Generalized weakness The patient is presenting with complains of generalized weakness and physical deconditioning leading to recurrent fall.extensive workup including CT of the head x-ray of the chest, EKG, lab work does not show any acute abnormality. Troponin ammonia level urinalysis are also normal urine drug screen is also negative. ABG shows mild hypercarbia but appears well compensated. Patient mentions is compliant with his ventilator home. With this most likely etiology of patient's current presentation is progressively worsening physical deconditioningfor which she was in the past recommended to LTAC. At present I would obtain further workup including proBNP TSH INR CK phosphorous magnesium, monitor him on telemetry and obtain a consultation from physical therapy and occupational therapy. Currently the patient is [facility]able [facility]  2.ventilatory  dependence, obesity hypoventilation syndrome, tracheostomy in place, I have consulted PCCM further management of the above issues Who will be following up with the patient as needed.  3.diabetes mellitus Recent A1c 7.3 in September. Continue current management. Continue Reglan for gastroparesis. Placing the patient on sliding scale.  4.hypertension. Continuing amlodipine carvedilol clonidine at home dose  is also continuing Lasix at home doses.as well as losartan.  5. On warfarin. INR subtherapeutic. History of DVT years ago. At present pharmacy will be consulted for continuation of warfarin dosing. Until his INR is therapeutic patient will be provided heparin for DVT prophylaxis as well  Advance goals of care discussion: patient wants to remain DO NOT RESUSCITATE   Consults: PC CM  DVT Prophylaxis: subcutaneous Heparin and on chronic anticoagulation Nutrition: cardiac and diabetic diet  Disposition: Admitted to inpatient in step-down unit.  Author: Berle Mull, MD [facility] Pager: [phone] 12/02/2013, 11:13 PM    If 7PM-7AM, please contact night-coverage [email or web address] Password [email or web address]

## 2013-12-02 NOTE — Consult Note (Signed)
Name: Bentzion Dauria MRN: [id number] DOB: 10/08/1945    ADMISSION DATE:  12/02/2013 CONSULTATION DATE:  12/02/2013  REFERRING MD :  TRH  CHIEF COMPLAINT:  Mechanical fall, chronic respiratory failure s/p tracheostomy  BRIEF PATIENT DESCRIPTION: 68 y.o. M with chronic trach (diurnal ATC, noctural vent) for chronic resp failure due to OSA/OHS, brought to [facility] ED 11/17 after he suffered a few mechanical falls while at home.  Fully asymptomatic; however, admitted overnight by [facility] for observation.  PCCM consulted for vent management.  SIGNIFICANT EVENTS  11/17 - admitted  STUDIES:  CT head 11/17 >>> negative   HISTORY OF PRESENT ILLNESS:  Mr. Chiaramonte is a 68 y.o. M with PMH as outlined below which includes chronic respiratory failure due to OSA/OHS.  He has a chronic trach (initially placed in 2006) and is normally on ATC during the day and vent at night (through [place]).  He is followed by Dr. Yevonne Aline (ENT) in [facility] and gets his trach changed roughly every 2 months. On evening of 11/17, he presented to [facility] ED after he suffered a few falls while at his home earlier that day.  Pt reports that he was simply weak in the legs and did not have any syncopal episode.  At time of fall, he denied any LOC, dizziness, lightheadedness, visual changes, SOB, chest pain.  He reports that his legs have been weak for quite some time now and he has had some difficult moving around; however, he typically does not fall (though does have a hx of falls in the past).  He does use a rolling walker for assist with ambulation. He feels that he has been in his USOH and denies any headache, dizziness, visual changes, chest pain, SOB, N/V/D, fevers/chills/sweats.   Pt was admitted by [facility] for further monitoring overnight and PCCM was consulted for vent management while he is hospitalized.  Per DC summary from prior admission (09/24/13), previous vent settings were PRVC mode, Rate 14, VT 600, PEEP 5.  PAST MEDICAL  HISTORY :   has a past medical history of ANXIETY (07/23/2006); ASTHMA (07/23/2006); DM (05/23/2007); HYPERTENSION (07/23/2006); OSTEOARTHRITIS (07/23/2006); HYPERCHOLESTEROLEMIA (05/23/2007); ANEMIA (08/31/2008); Palpitations (02/10/2008); ALCOHOL ABUSE, IN REMISSION, HX OF (08/26/2008); TOBACCO ABUSE, HX OF (08/26/2008); Pulmonary embolism (03/16/2010); ED (erectile dysfunction); Morbid obesity; DM retinopathy; Blindness of left eye; DM neuropathy with neurologic complication; CHF (congestive heart failure); Cough secondary to angiotensin converting enzyme inhibitor (ACE-I); Sleep apnea; Shortness of breath; and CARCINOMA, PROSTATE (09/29/2009).  has past surgical history that includes Back surgery and Tracheostomy. Prior to Admission medications   Medication Sig Start Date End Date Taking? Authorizing Provider  acetaminophen (TYLENOL) 650 MG CR tablet Take 1,300 mg by mouth See admin instructions. Takes two tablets every night at bedtime. Can take daily as needed too.   Yes Historical Provider, MD  alprazolam Duanne Moron) 2 MG tablet 3 tabs at bedtime 11/25/13  Yes Renato Shin, MD  insulin glargine (LANTUS) 100 UNIT/ML injection Inject 0.28 mLs (28 Units total) into the skin every morning. 09/29/13  Yes Samella Parr, NP  warfarin (COUMADIN) 10 MG tablet Take 10 mg by mouth daily.   Yes Historical Provider, MD  warfarin (COUMADIN) 5 MG tablet Take 1.5 tablets (7.5 mg total) by mouth daily. 08/02/13  Yes Donita Brooks, NP  amLODipine (NORVASC) 10 MG tablet Take 1 tablet (10 mg total) by mouth daily. 07/04/13   Renato Shin, MD  carvedilol (COREG) 6.25 MG tablet Take 6.25 mg by mouth 2 (two)  times daily with a meal.    Historical Provider, MD  citalopram (CELEXA) 20 MG tablet Take 1 tablet (20 mg total) by mouth daily. 04/30/13   Cherene Altes, MD  cloNIDine (CATAPRES) 0.1 MG tablet Take 0.1 mg by mouth 2 (two) times daily.  02/12/13   Renato Shin, MD  docusate sodium 100 MG CAPS Take 100 mg by mouth 2 (two) times  daily. 09/29/13   Samella Parr, NP  furosemide (LASIX) 20 MG tablet Take 3 tablets (60 mg total) by mouth daily. 07/07/13   Janece Canterbury, MD  KLOR-CON M20 20 MEQ tablet TAKE TWO TABLETS BY MOUTH ONCE DAILY 12/01/13   Renato Shin, MD  losartan (COZAAR) 100 MG tablet Take 0.5 tablets (50 mg total) by mouth daily. 08/11/13   Cherene Altes, MD  metoCLOPramide (REGLAN) 5 MG tablet Take 1 tablet (5 mg total) by mouth 3 (three) times daily. 11/24/13   Renato Shin, MD  Multiple Vitamin (MULTIVITAMIN) tablet Take 1 tablet by mouth daily.     Historical Provider, MD  Oxycodone HCl 10 MG TABS Take 1 tablet (10 mg total) by mouth every 6 (six) hours as needed. 10/14/13   Renato Shin, MD  polyethylene glycol (MIRALAX / GLYCOLAX) packet Take 17 g by mouth daily. 08/11/13   Cherene Altes, MD  potassium chloride SA (K-DUR,KLOR-CON) 20 MEQ tablet Take 1 tablet (20 mEq total) by mouth daily. 08/11/13   Cherene Altes, MD  senna-docusate (SENOKOT-S) 8.6-50 MG per tablet Take 1 tablet by mouth 2 (two) times daily. 08/11/13   Cherene Altes, MD  simvastatin (ZOCOR) 20 MG tablet Take 20 mg by mouth daily.    Historical Provider, MD  simvastatin (ZOCOR) 20 MG tablet TAKE ONE TABLET BY MOUTH ONCE DAILY 11/04/13   Renato Shin, MD   No Known Allergies  FAMILY HISTORY:  family history includes CAD in his father; Cancer in his other; Diabetes Mellitus II in his father. SOCIAL HISTORY:  reports that he quit smoking about 8 years ago. His smoking use included Cigarettes. He has a 7.5 pack-year smoking history. He does not have any smokeless tobacco history on file. He reports that he does not drink alcohol or use illicit drugs.  REVIEW OF SYSTEMS:   All negative; except for those that are bolded, which indicate positives.  Constitutional: weight loss, weight gain, night sweats, fevers, chills, fatigue, weakness.  HEENT: headaches, sore throat, sneezing, nasal congestion, post nasal drip, difficulty  swallowing, tooth/dental problems, visual complaints, visual changes, ear aches. Neuro: difficulty with speech, weakness, numbness, ataxia. CV:  chest pain, orthopnea, PND, swelling in lower extremities, dizziness, palpitations, syncope.  Resp: cough, hemoptysis, dyspnea, wheezing. GI  heartburn, indigestion, abdominal pain, nausea, vomiting, diarrhea, constipation, change in bowel habits, loss of appetite, hematemesis, melena, hematochezia.  GU: dysuria, change in color of urine, urgency or frequency, flank pain, hematuria. MSK: joint pain or swelling, decreased range of motion, weakness in legs. Psych: change in mood or affect, depression, anxiety, suicidal ideations, homicidal ideations. Skin: rash, itching, bruising.   SUBJECTIVE:  Feels perfectly normal, fully asymptomatic.  States he only feels weakness in his legs when he stands up.  VITAL SIGNS: Temp:  [98.6 F (37 C)-99.3 F (37.4 C)] 99.3 F (37.4 C) (11/17 2245) Pulse Rate:  [71-82] 75 (11/17 2245) Resp:  [12-27] 16 (11/17 2245) BP: (115-177)/(64-91) 169/84 mmHg (11/17 2245) SpO2:  [93 %-100 %] 100 % (11/17 2245) FiO2 (%):  [28 %] 28 % (11/17 1600)  PHYSICAL EXAMINATION: General: Morbidly obese male, resting in bed, in NAD. Neuro: A&O x 3, non-focal.  HEENT: [place]/AT. PERRL, sclerae anicteric.  Has chronic tracheostomy. Cardiovascular: RRR, no M/R/G appreciated. Lungs: Respirations even and unlabored.  CTA bilaterally, No W/R/R.  Chronic trach on ATC currently. Abdomen: Obese, BS x 4, soft, NT/ND.  Musculoskeletal: Chronic venous stasis changes bilaterally, no edema.  Skin: Intact, warm, no rashes.     Recent Labs Lab 12/02/13 1707  NA 138  K 4.9  CL 102  CO2 27  BUN 21  CREATININE 1.54*  GLUCOSE 183*    Recent Labs Lab 12/02/13 1707  HGB 11.6*  HCT 35.4*  WBC 7.3  PLT 192   Ct Head Wo Contrast  12/02/2013   CLINICAL DATA:  Mental status change.  EXAM: CT HEAD WITHOUT CONTRAST  TECHNIQUE: Contiguous  axial images were obtained from the base of the skull through the vertex without intravenous contrast.  COMPARISON:  CT 08/25/1998  FINDINGS: No acute intracranial hemorrhage. No focal mass lesion. No CT evidence of acute infarction. No midline shift or mass effect. No hydrocephalus. Generalized cortical atrophy. Basilar cisterns are patent. Paranasal sinuses and mastoid air cells are clear. Right globe prosthetic  IMPRESSION: 1. No acute intracranial findings. 2. Generalized cortical.   Electronically Signed   By: Suzy Bouchard M.D.   On: 12/02/2013 18:04   Dg Chest Port 1 View  12/02/2013   CLINICAL DATA:  Fever. Unable to give history. History of hypertension and asthma. Patient has a tracheostomy.  EXAM: PORTABLE CHEST - 1 VIEW  COMPARISON:  09/24/2013  FINDINGS: Left lung base not completely imaged on this exam. Allowing for this, lungs are clear. No pneumothorax. Cardiac silhouette is mildly enlarged. No mediastinal or hilar masses. Tracheostomy tube is stable and well positioned.  IMPRESSION: No acute cardiopulmonary disease.   Electronically Signed   By: Lajean Manes M.D.   On: 12/02/2013 16:38    ASSESSMENT / PLAN:  Chronic tracheostomy status - vent dependent nocturnally, ATC diurnally. Chronic respiratory failure due to OSA / OSH Hx PE - on coumadin, INR subtherapeutic on admission Recs: Continue nocturnal vent support (per last discharge summary, prior settings were PRVC, Rate 14, VT 600, PEEP 5). Continue diurnal ATC. Pulmonary hygiene. Continue coumadin per pharmacy.  Generalized deconditioning Mechanical fall Recs: PT / OT consult in AM.  All other problems, per primary team.  CC time: 35 minutes.   Montey Hora, [facility] Pgr: [phone]  or [phone] 12/02/2013, 11:47 PM

## 2013-12-02 NOTE — ED Notes (Signed)
Pt unable to stand during orthostatic standing vitals. Unable to ambulate pt, also.

## 2013-12-03 DIAGNOSIS — J9612 Chronic respiratory failure with hypercapnia: Secondary | ICD-10-CM

## 2013-12-03 LAB — CBC WITH DIFFERENTIAL/PLATELET
BASOS PCT: 0 % (ref 0–1)
Basophils Absolute: 0 10*3/uL (ref 0.0–0.1)
Eosinophils Absolute: 0.1 10*3/uL (ref 0.0–0.7)
Eosinophils Relative: 1 % (ref 0–5)
HCT: 32.8 % — ABNORMAL LOW (ref 39.0–52.0)
HEMOGLOBIN: 10.5 g/dL — AB (ref 13.0–17.0)
Lymphocytes Relative: 17 % (ref 12–46)
Lymphs Abs: 1 10*3/uL (ref 0.7–4.0)
MCH: 27.3 pg (ref 26.0–34.0)
MCHC: 32 g/dL (ref 30.0–36.0)
MCV: 85.4 fL (ref 78.0–100.0)
Monocytes Absolute: 0.4 10*3/uL (ref 0.1–1.0)
Monocytes Relative: 7 % (ref 3–12)
NEUTROS PCT: 75 % (ref 43–77)
Neutro Abs: 4.4 10*3/uL (ref 1.7–7.7)
Platelets: 187 10*3/uL (ref 150–400)
RBC: 3.84 MIL/uL — ABNORMAL LOW (ref 4.22–5.81)
RDW: 13.3 % (ref 11.5–15.5)
WBC: 5.9 10*3/uL (ref 4.0–10.5)

## 2013-12-03 LAB — COMPREHENSIVE METABOLIC PANEL
ALBUMIN: 2.8 g/dL — AB (ref 3.5–5.2)
ALK PHOS: 59 U/L (ref 39–117)
ALT: 9 U/L (ref 0–53)
AST: 20 U/L (ref 0–37)
Anion gap: 11 (ref 5–15)
BUN: 19 mg/dL (ref 6–23)
CO2: 25 meq/L (ref 19–32)
Calcium: 8.8 mg/dL (ref 8.4–10.5)
Chloride: 103 mEq/L (ref 96–112)
Creatinine, Ser: 1.52 mg/dL — ABNORMAL HIGH (ref 0.50–1.35)
GFR calc Af Amer: 53 mL/min — ABNORMAL LOW (ref >90)
GFR calc non Af Amer: 46 mL/min — ABNORMAL LOW (ref >90)
Glucose, Bld: 149 mg/dL — ABNORMAL HIGH (ref 70–99)
POTASSIUM: 4.4 meq/L (ref 3.7–5.3)
Sodium: 139 mEq/L (ref 137–147)
Total Bilirubin: 0.4 mg/dL (ref 0.3–1.2)
Total Protein: 6.3 g/dL (ref 6.0–8.3)

## 2013-12-03 LAB — VITAMIN B12: Vitamin B-12: 675 pg/mL (ref 211–911)

## 2013-12-03 LAB — GLUCOSE, CAPILLARY
GLUCOSE-CAPILLARY: 122 mg/dL — AB (ref 70–99)
GLUCOSE-CAPILLARY: 132 mg/dL — AB (ref 70–99)
Glucose-Capillary: 143 mg/dL — ABNORMAL HIGH (ref 70–99)
Glucose-Capillary: 158 mg/dL — ABNORMAL HIGH (ref 70–99)
Glucose-Capillary: 132 mg/dL — ABNORMAL HIGH (ref 70–99)

## 2013-12-03 LAB — URINE CULTURE
Colony Count: NO GROWTH
Culture: NO GROWTH

## 2013-12-03 LAB — TSH: TSH: 0.692 u[IU]/mL (ref 0.350–4.500)

## 2013-12-03 LAB — PROTIME-INR
INR: 1.57 — ABNORMAL HIGH (ref 0.00–1.49)
Prothrombin Time: 19 seconds — ABNORMAL HIGH (ref 11.6–15.2)

## 2013-12-03 LAB — SEDIMENTATION RATE: Sed Rate: 74 mm/hr — ABNORMAL HIGH (ref 0–16)

## 2013-12-03 LAB — MRSA PCR SCREENING: MRSA BY PCR: NEGATIVE

## 2013-12-03 LAB — PRO B NATRIURETIC PEPTIDE: Pro B Natriuretic peptide (BNP): 1519 pg/mL — ABNORMAL HIGH (ref 0–125)

## 2013-12-03 LAB — FOLATE: Folate: >20 ng/mL

## 2013-12-03 MED ORDER — VANCOMYCIN HCL 10 G IV SOLR
2000.0000 mg | INTRAVENOUS | Status: DC
  Filled 2013-12-03: qty 2000

## 2013-12-03 MED ORDER — WARFARIN SODIUM 10 MG PO TABS
10.0000 mg | ORAL_TABLET | Freq: Once | ORAL | Status: AC
  Administered 2013-12-03: 10 mg via ORAL
  Filled 2013-12-03: qty 1

## 2013-12-03 MED ORDER — PIPERACILLIN-TAZOBACTAM 3.375 G IVPB
3.3750 g | Freq: Three times a day (TID) | INTRAVENOUS | Status: DC
  Administered 2013-12-04 (×3): 3.375 g via INTRAVENOUS
  Filled 2013-12-03 (×5): qty 50

## 2013-12-03 MED ORDER — CHLORHEXIDINE GLUCONATE 0.12 % MT SOLN
15.0000 mL | Freq: Two times a day (BID) | OROMUCOSAL | Status: DC
  Administered 2013-12-03 – 2013-12-17 (×22): 15 mL via OROMUCOSAL
  Filled 2013-12-03 (×32): qty 15

## 2013-12-03 MED ORDER — VANCOMYCIN HCL 10 G IV SOLR
2500.0000 mg | Freq: Once | INTRAVENOUS | Status: AC
  Administered 2013-12-04: 2500 mg via INTRAVENOUS
  Filled 2013-12-03: qty 2500

## 2013-12-03 MED ORDER — CETYLPYRIDINIUM CHLORIDE 0.05 % MT LIQD
7.0000 mL | Freq: Four times a day (QID) | OROMUCOSAL | Status: DC
  Administered 2013-12-03 – 2013-12-16 (×45): 7 mL via OROMUCOSAL

## 2013-12-03 MED ORDER — WARFARIN - PHARMACIST DOSING INPATIENT
Freq: Every day | Status: DC
  Administered 2013-12-03: 18:00:00
  Administered 2013-12-05: 1
  Administered 2013-12-11: 18:00:00
  Administered 2013-12-12: 1

## 2013-12-03 NOTE — Progress Notes (Signed)
Nutrition Brief Note  Patient seen due to being on a ventilator Pt with hx of trach (2004) and on vent support at night. Pt lives at home with mom. Admitted after 3 falls at home in 36 hours. Plan for LTACH placement. Per OT may need to consider wheelchair as primary mobilization method.  Pt being taken off ventilator support currently. Pt typically consumes 100% of his meals. Pt has been educated on weight loss last admission.    Wt Readings from Last 15 Encounters:  12/03/13 353 lb 6.4 oz (160.3 kg)  09/29/13 349 lb 6.9 oz (158.5 kg)  08/10/13 365 lb (165.563 kg)  07/08/13 368 lb 9.8 oz (167.2 kg)  04/30/13 381 lb 13.4 oz (173.2 kg)  03/29/13 221 lb (100.245 kg)  02/19/13 370 lb (167.831 kg)  11/27/12 445 lb 5.3 oz (202 kg)  11/24/12 390 lb (176.903 kg)  11/06/12 370 lb (167.831 kg)  06/24/12 360 lb (163.295 kg)  05/22/12 353 lb (160.12 kg)  10/05/11 357 lb 2.3 oz (162 kg)  09/21/11 353 lb (160.12 kg)  03/21/11 366 lb (166.017 kg)    Body mass index is 45.35 kg/(m^2). Patient meets criteria for morbid obesity based on current BMI.   Current diet order is NPO, until extubate. Labs and medications reviewed.   No nutrition interventions warranted at this time. If nutrition issues arise, please consult RD.   [place], [place], [place] Pager [phone] After Hours Pager

## 2013-12-03 NOTE — Progress Notes (Signed)
ANTICOAGULATION CONSULT NOTE - Follow Up Consult  Pharmacy Consult for Warfarin Indication: pulmonary embolus  No Known Allergies  Patient Measurements: Height: 6\' 2"  (188 cm) Weight: (!) 353 lb 6.4 oz (160.3 kg) IBW/kg (Calculated) : 82.2  Vital Signs: Temp: 99.3 F (37.4 C) (11/18 0700) Temp Source: Oral (11/17 2321) BP: 128/71 mmHg (11/18 0751) Pulse Rate: 69 (11/18 0700)  Labs:  Recent Labs  12/02/13 1707 12/02/13 2125 12/03/13 0121 12/03/13 0613  HGB 11.6*  --   --  10.5*  HCT 35.4*  --   --  32.8*  PLT 192  --   --  187  LABPROT  --  19.4* 19.0*  --   INR  --  1.62* 1.57*  --   CREATININE 1.54*  --   --  1.52*  CKTOTAL  --  370*  --   --   TROPONINI <0.30  --   --   --     Estimated Creatinine Clearance: 75.6 mL/min (by C-G formula based on Cr of 1.52).   Assessment: 7 YOM admitted on 11/17 with generalized weakness and falls. The patient was on warfarin PTA for hx PE and was noted to have a SUBtherapeutic INR on admission of 1.62. Warfarin 10 mg was given this morning and an INR drawn at the same time the warfarin dose was given was 1.57 - this is not reflective of the dose given earlier today. Will plan to give warfarin 10 mg again this evening The patient is being bridged with Hep SQ for VTE prophylaxis while the INR remains subtherapeutic. Hgb/Hct slight drop, plts wnl - no overt s/sx of bleeding noted.  Goal of Therapy:  INR 2-3   Plan:  1. Warfarin 10 mg x 1 dose at 1800 today 2. Will continue to monitor for any signs/symptoms of bleeding and will follow up with PT/INR in the a.m.   Alycia Rossetti, PharmD, BCPS Clinical Pharmacist Pager: [phone] 12/03/2013 9:25 AM

## 2013-12-03 NOTE — Progress Notes (Signed)
ANTIBIOTIC CONSULT NOTE - INITIAL  Pharmacy Consult for Vancomycin and Zosyn Indication: Possible bacteremia  No Known Allergies  Patient Measurements: Height: 6\' 2"  (188 cm) Weight: (!) 353 lb 6.4 oz (160.3 kg) IBW/kg (Calculated) : 82.2 Adjusted Body Weight:   Vital Signs: Temp: 98 F (36.7 C) (11/18 2100) Temp Source: Oral (11/18 2100) BP: 144/71 mmHg (11/18 2105) Pulse Rate: 68 (11/18 2017) Intake/Output from previous day: 11/17 0701 - 11/18 0700 In: 125 [P.O.:125] Out: 1350 [Urine:1350] Intake/Output from this shift: Total I/O In: -  Out: 125 [Urine:125]  Labs:  Recent Labs  12/02/13 1707 12/03/13 0613  WBC 7.3 5.9  HGB 11.6* 10.5*  PLT 192 187  CREATININE 1.54* 1.52*   Estimated Creatinine Clearance: 75.6 mL/min (by C-G formula based on Cr of 1.52). No results for input(s): VANCOTROUGH, VANCOPEAK, VANCORANDOM, GENTTROUGH, GENTPEAK, GENTRANDOM, TOBRATROUGH, TOBRAPEAK, TOBRARND, AMIKACINPEAK, AMIKACINTROU, AMIKACIN in the last 72 hours.   Microbiology: Recent Results (from the past 720 hour(s))  Culture, blood (routine x 2)     Status: None (Preliminary result)   Collection Time: 12/02/13  5:07 PM  Result Value Ref Range Status   Specimen Description BLOOD RIGHT FOREARM  Final   Special Requests BOTTLES DRAWN AEROBIC AND ANAEROBIC 10CC  Final   Culture  Setup Time   Final    12/03/2013 00:26 Performed at [facility]    Culture   Final    [place] IN CLUSTERS Note: Gram Stain Report Called to,Read Back By and Verified With: MICHELLE MCMILLAN ON 12/03/2013 AT 9:22P BY WILEJ Performed at [facility]    Report Status PENDING  Incomplete  Culture, respiratory (NON-Expectorated)     Status: None (Preliminary result)   Collection Time: 12/02/13  9:15 PM  Result Value Ref Range Status   Specimen Description SPUTUM  Final   Special Requests NONE  Final   Gram Stain   Final    MODERATE WBC PRESENT, PREDOMINANTLY PMN MODERATE  SQUAMOUS EPITHELIAL CELLS PRESENT FEW GRAM POSITIVE COCCI IN PAIRS IN CHAINS FEW GRAM POSITIVE RODS FEW GRAM NEGATIVE COCCI    Culture   Final    Culture reincubated for better growth Performed at [facility]    Report Status PENDING  Incomplete  MRSA PCR Screening     Status: None   Collection Time: 12/03/13 12:16 AM  Result Value Ref Range Status   MRSA by PCR NEGATIVE NEGATIVE Final    Comment:        The GeneXpert MRSA Assay (FDA approved for NASAL specimens only), is one component of a comprehensive MRSA colonization surveillance program. It is not intended to diagnose MRSA infection nor to guide or monitor treatment for MRSA infections.     Medical History: Past Medical History  Diagnosis Date  . ANXIETY 07/23/2006  . ASTHMA 07/23/2006  . DM 05/23/2007  . HYPERTENSION 07/23/2006  . OSTEOARTHRITIS 07/23/2006  . HYPERCHOLESTEROLEMIA 05/23/2007  . ANEMIA 08/31/2008  . Palpitations 02/10/2008  . ALCOHOL ABUSE, IN REMISSION, HX OF 08/26/2008  . TOBACCO ABUSE, HX OF 08/26/2008  . Pulmonary embolism 03/16/2010  . ED (erectile dysfunction)   . Morbid obesity   . DM retinopathy   . 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 DM neuropathy with neurologic complication   . CHF (congestive heart failure)     With a preserved EF  . 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 Sleep apnea   . Shortness of breath   . CARCINOMA, PROSTATE 09/29/2009    Medications:  Prescriptions prior to admission  Medication Sig Dispense Refill Last Dose  . acetaminophen (TYLENOL) 650 MG CR tablet Take 1,300 mg by mouth See admin instructions. Takes two tablets every night at bedtime. Can take daily as needed too.   12/01/2013 at Unknown time  . alprazolam (XANAX) 2 MG tablet 3 tabs at bedtime 90 tablet 3 12/01/2013 at Unknown time  . amLODipine (NORVASC) 10 MG tablet Take 1 tablet (10 mg total) by mouth daily. 30 tablet  1 Past Week at Unknown time  . carvedilol (COREG) 6.25 MG tablet Take 6.25 mg by mouth 2 (two) times daily with a meal.   Past Week at Unknown time  . citalopram (CELEXA) 20 MG tablet Take 1 tablet (20 mg total) by mouth daily. (Patient taking differently: Take 20 mg by mouth every evening. ) 30 tablet 4 Past Week at Unknown time  . cloNIDine (CATAPRES) 0.1 MG tablet Take 0.1 mg by mouth 2 (two) times daily.    Past Week at Unknown time  . docusate sodium 100 MG CAPS Take 100 mg by mouth 2 (two) times daily. 10 capsule 0 Past Week at Unknown time  . furosemide (LASIX) 20 MG tablet Take 3 tablets (60 mg total) by mouth daily. (Patient taking differently: Take 40-60 mg by mouth See admin instructions. 40 mg daily but will take an additional 20 mg if needed) 180 tablet 0 Past Week at Unknown time  . insulin glargine (LANTUS) 100 UNIT/ML injection Inject 0.28 mLs (28 Units total) into the skin every morning. 10 mL 11 12/01/2013 at Unknown time  . KLOR-CON M20 20 MEQ tablet TAKE TWO TABLETS BY MOUTH ONCE DAILY 60 tablet 0 Past Week at Unknown time  . losartan (COZAAR) 100 MG tablet Take 0.5 tablets (50 mg total) by mouth daily.   Past Week at Unknown time  . metoCLOPramide (REGLAN) 5 MG tablet Take 1 tablet (5 mg total) by mouth 3 (three) times daily. 90 tablet 1 Past Week at Unknown time  . Multiple Vitamin (MULTIVITAMIN) tablet Take 1 tablet by mouth daily.    Past Week at Unknown time  . Oxycodone HCl 10 MG TABS Take 1 tablet (10 mg total) by mouth every 6 (six) hours as needed. 100 tablet 0 Past Month at Unknown time  . polyethylene glycol (MIRALAX / GLYCOLAX) packet Take 17 g by mouth daily. 14 each 0 Past Week at Unknown time  . potassium chloride SA (K-DUR,KLOR-CON) 20 MEQ tablet Take 1 tablet (20 mEq total) by mouth daily.   Past Week at Unknown time  . senna-docusate (SENOKOT-S) 8.6-50 MG per tablet Take 1 tablet by mouth 2 (two) times daily. (Patient taking differently: Take 1 tablet by mouth  daily as needed for mild constipation or moderate constipation. )   unknown  . simvastatin (ZOCOR) 20 MG tablet TAKE ONE TABLET BY MOUTH ONCE DAILY 90 tablet 0 Past Week at Unknown time  . warfarin (COUMADIN) 10 MG tablet Take 5-10 mg by mouth daily at 6 PM. Pt takes 10 mg mon, wed, fri and 5mg  all other days   Past Week at Unknown time   Assessment: 68yom with chronic trach and 1/2 blood cultures growing GPC to start Vancomycin and Zosyn for possible bacteremia.  - Afebrile, WBC wnl - Wt 160kg - CrCl 75 (normalized ~48 ml/min)  Goal of Therapy:  Vancomycin  trough level 15-20 mcg/ml  Plan:  1. Vancomycin 2.5g IV x 1, then 2g IV q24h 2. Zosyn 3.375g IV q8h - infuse over 4 hours 3. Monitor renal function, cultures, clinical course and order Vancomycin trough at [facility]  [phone] 12/03/2013,9:52 PM

## 2013-12-03 NOTE — Progress Notes (Signed)
Chaplain initiated visit with pt. Chaplain made pt aware of her services. Not needed at this time. Page as needed.   12/03/13 1100  Clinical Encounter Type  Visited With Patient  Visit Type Initial  Esbeidy Mclaine, Barbette Hair, Chaplain 12/03/2013 11:26 AM

## 2013-12-03 NOTE — Progress Notes (Addendum)
Received request for [facility] for patient.  Contacted his insurance representative assigned to [facility].  She was very familiar with the patient and said that he would not be a candidate for LTACH.  If he still requires any vent support and can't safely go home, he would require vent SNF at d/c.   UR completed.  Sandi Mariscal, RN BSN [place] CCM Trauma/Neuro ICU Case Manager [phone]

## 2013-12-03 NOTE — Progress Notes (Signed)
Physical Therapy Evaluation  Past Medical History  Diagnosis Date  . ANXIETY 07/23/2006  . ASTHMA 07/23/2006  . DM 05/23/2007  . HYPERTENSION 07/23/2006  . OSTEOARTHRITIS 07/23/2006  . HYPERCHOLESTEROLEMIA 05/23/2007  . ANEMIA 08/31/2008  . Palpitations 02/10/2008  . ALCOHOL ABUSE, IN REMISSION, HX OF 08/26/2008  . TOBACCO ABUSE, HX OF 08/26/2008  . Pulmonary embolism 03/16/2010  . ED (erectile dysfunction)   . Morbid obesity   . DM retinopathy   . 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 DM neuropathy with neurologic complication   . CHF (congestive heart failure)     With a preserved EF  . 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 Sleep apnea   . Shortness of breath   . CARCINOMA, PROSTATE 09/29/2009   Past Surgical History  Procedure Laterality Date  . Back surgery    . Tracheostomy     Pt globally weak and debilitated, and not appropriate to D/C to home with his 67 yr old mother.  Pt agreeable to further rehab and would benefit from Ltach, but per CM pt does not have any Ltach benefits, so at this time would need Vent SNF.  Will continue to follow while on acute.      12/03/13 1100  PT Visit Information  Last PT Received On 12/03/13  Assistance Needed +3 or more  History of Present Illness Pt adm with weakness, deconditioning and incr falls. PMHx-Long term trach - on vent at night. Morbid obesity, DM, CHF, severe arthritis of knees with frequent falls and 7 admissions since 2/15 for falls  Precautions  Precautions Fall  Restrictions  Weight Bearing Restrictions No  Home Living  Family/patient expects to be discharged to: Skilled nursing facility  Additional Comments pt uses Vent at nights and has had a chronic trach since 2004.  pt lives with his 17 yr old mother.    Prior Function  Level of Independence Needs assistance  Gait / Transfers Assistance Needed pt uses RW, but only ambulates short distance  and has frequent falls.    ADL's / Homemaking Assistance Needed Pt reports he sponge bathes  or uses tub bench mod I and is able to dress self.  He does not do househould management activities.  He states his daughters do that and the cooking  Communication / Swallowing Assistance Needed pt doesn't use PMSV, but places his finger over his trach to talk.    Communication  Communication Tracheostomy  Pain Assessment  Pain Assessment No/denies pain  Cognition  Arousal/Alertness Awake/alert  Behavior During Therapy WFL for tasks assessed/performed  Overall Cognitive Status Within Functional Limits for tasks assessed  Upper Extremity Assessment  Upper Extremity Assessment Defer to OT evaluation  Lower Extremity Assessment  Lower Extremity Assessment RLE deficits/detail;LLE deficits/detail  RLE Deficits / Details Generally weak, 4-/5 and indicates "bad knees".    LLE Deficits / Details Generally weak, 4-/5 and indicates bad knees.    Bed Mobility  Overal bed mobility Needs Assistance  Bed Mobility Supine to Sit;Sit to Supine  Supine to sit Min assist;HOB elevated  Sit to supine Mod assist;+2 for physical assistance  General bed mobility comments A with LEs and cues for technique.    Transfers  Overall transfer level Needs assistance  Equipment used 2 person hand held assist  Transfers Sit to/from Stand  Sit to Stand Total assist;+2 physical assistance  General  transfer comment Attempted to come to stand x3 with pt initially a ModA x2 to initiate standing, but could only attain a semi squat position and then became Total A and unable to complete stand.  pt indicates Bil knees are bad and that he feels weak from his waist down.    Balance  Overall balance assessment Needs assistance  Sitting-balance support No upper extremity supported;Feet supported  Sitting balance-Leahy Scale Fair  Standing balance support Bilateral upper extremity supported;During functional activity  Standing  balance-Leahy Scale Zero  PT - End of Session  Equipment Utilized During Treatment Gait belt  Activity Tolerance Patient limited by fatigue  Patient left in bed;with call bell/phone within reach  Nurse Communication Mobility status  PT Assessment  PT Therapy Diagnosis  Generalized weakness  PT Recommendation/Assessment Patient needs continued PT services  PT Problem List Decreased strength;Decreased activity tolerance;Decreased balance;Decreased mobility;Decreased knowledge of use of DME;Cardiopulmonary status limiting activity  Barriers to Discharge Decreased caregiver support  Barriers to Discharge Comments pt lives with his 20 yr old mother.    PT Plan  PT Frequency (ACUTE ONLY) Min 2X/week  PT Treatment/Interventions (ACUTE ONLY) Gait training;DME instruction;Functional mobility training;Therapeutic activities;Therapeutic exercise;Balance training;Patient/family education  PT Recommendation  Follow Up Recommendations SNF (with vent)  PT equipment None recommended by PT  Individuals Consulted  Consulted and Agree with Results and Recommendations Patient  Acute Rehab PT Goals  Patient Stated Goal to walk without a walker.  PT Goal Formulation With patient  Time For Goal Achievement 12/17/13  Potential to Achieve Goals Fair  PT Time Calculation  PT Start Time (ACUTE ONLY) 0914  PT Stop Time (ACUTE ONLY) 0950  PT Time Calculation (min) (ACUTE ONLY) 36 min  PT General Charges  $$ ACUTE PT VISIT 1 Procedure  PT Evaluation  $Initial PT Evaluation Tier I 1 Procedure  PT Treatments  $Therapeutic Activity 23-37 mins  Written Expression  Dominant Hand Right   [place], [place]

## 2013-12-03 NOTE — Evaluation (Signed)
Occupational Therapy Evaluation Patient Details Name: Travis Chapman MRN: [id number] DOB: [birth year] Today's Date: 12/03/2013    History of Present Illness Pt adm with weakness, deconditioning and incr falls. PMHx-Long term trach - on vent at night. Morbid obesity, DM, CHF, severe arthritis of knees with frequent falls and 7 admissions since 2/15 for falls   Clinical Impression    Pt admitted with above. Pt independent with ADLs, PTA. Feel pt will benefit from acute OT to increase independence with BADLs and increase strength prior to d/c. Recommending SNF for rehab.     Follow Up Recommendations  SNF;Supervision/Assistance - 24 hour    Equipment Recommendations  Other (comment) (defer to next venue)    Recommendations for Other Services       Precautions / Restrictions Precautions Precautions: Fall Restrictions Weight Bearing Restrictions: No      Mobility Bed Mobility Overal bed mobility: Needs Assistance Bed Mobility: Supine to Sit     Supine to sit: Min assist     General bed mobility comments: assist with LE and cues for technique.  Transfers Overall transfer level: Needs assistance Equipment used:  Clarise Cruz Plus) Transfers: Sit to/from Stand Sit to Stand: +2 physical assistance (used Clarise Cruz plus)         General transfer comment: Pt stood 3-4 times from bed with Clarise Cruz Plus lift. +3 assist to move bed and place chair under pt. Educated on Geophysicist/field seismologist and instructions for transfer.    Balance                                            ADL Overall ADL's : Needs assistance/impaired     Grooming: Applying deodorant;Wash/dry face;Set up;Supervision/safety;Sitting   Upper Body Bathing: Set up;Supervision/ safety;Sitting   Lower Body Bathing: Sitting/lateral leans;Bed level;Maximal assistance       Lower Body Dressing: Maximal assistance;Sitting/lateral leans;Bed level   Toilet Transfer: +2 for physical assistance Clarise Cruz Plus lift; sit to  stand from bed)   Toileting- Clothing Manipulation and Hygiene: +2 for physical assistance;Sit to/from stand (used Clarise Cruz Plus to stand)       Functional mobility during ADLs: +2 for physical assistance Clarise Cruz Plus for sit <> stand) General ADL Comments: Pt stood from bed with Clarise Cruz Plus lift-OT washed pt's buttocks from BM. Instucted pt to be moving arms and legs in chair. Explained alternative techniques for LB ADLs (leaning, rolling). Explained AE is available for LB ADLs if needed. Pt washed off some while sitting in chair and performed grooming tasks. Explained benefit of being up in chair.     Vision    Pt blind in right eye.                 Perception     Praxis      Pertinent Vitals/Pain Pain Assessment: No/denies pain (no pain at end of session; c/o knee pain at beginning)     Hand Dominance Right   Extremity/Trunk Assessment Upper Extremity Assessment Upper Extremity Assessment: Overall WFL for tasks assessed   Lower Extremity Assessment Lower Extremity Assessment: Defer to PT evaluation       Communication Communication Communication: Tracheostomy   Cognition Arousal/Alertness: Awake/alert Behavior During Therapy: [facility] for tasks assessed/performed Overall Cognitive Status: Within Functional Limits for tasks assessed (unaware of deficits-thought he could walk to chair)  General Comments       Exercises       Shoulder Instructions      Home Living Family/patient expects to be discharged to:: Skilled nursing facility Living Arrangements: Parent Available Help at Discharge: Family Type of Home: House Home Access: Stairs to enter Technical brewer of Steps: 3 Entrance Stairs-Rails: Left;Right;Can reach both Home Layout: One level     Bathroom Shower/Tub: Tub/shower unit Shower/tub characteristics: Architectural technologist: Standard Bathroom Accessibility: Yes How Accessible: Accessible via walker Home Equipment:  Gilford Rile - 2 wheels;Wheelchair - manual;Tub bench   Additional Comments: pt uses Vent at nights and has had a chronic trach since 2004.  pt lives with his 32 yr old mother.        Prior Functioning/Environment Level of Independence: Needs assistance  Gait / Transfers Assistance Needed: pt uses RW, but only ambulates short distance and has frequent falls.   ADL's / Homemaking Assistance Needed: Pt reports he sponge bathes  or uses tub bench mod I and is able to dress self.  He does not do househould management activities.  He states his daughters do that and the cooking Communication / Swallowing Assistance Needed: pt doesn't use PMSV, but places his finger over his trach to talk.        OT Diagnosis: Generalized weakness;Acute pain   OT Problem List: Decreased strength;Obesity;Decreased range of motion;Decreased knowledge of use of DME or AE;Decreased knowledge of precautions;Pain;Decreased activity tolerance   OT Treatment/Interventions: Self-care/ADL training;Therapeutic exercise;DME and/or AE instruction;Therapeutic activities;Patient/family education;Balance training    OT Goals(Current goals can be found in the care plan section) Acute Rehab OT Goals Patient Stated Goal: to walk without walker OT Goal Formulation: With patient Time For Goal Achievement: 12/17/13 Potential to Achieve Goals: Good ADL Goals Pt Will Perform Lower Body Bathing: sitting/lateral leans;bed level;with min assist Pt Will Perform Lower Body Dressing: sitting/lateral leans;bed level;with min assist Pt Will Transfer to Toilet: with mod assist;stand pivot transfer;bedside commode Pt Will Perform Toileting - Clothing Manipulation and hygiene: with min assist;sitting/lateral leans;bed level  OT Frequency: Min 2X/week   Barriers to D/C:            Co-evaluation              End of Session Equipment Utilized During Treatment: Gait belt Nurse Communication: Other (comment);Need for lift equipment (pt up  in chair)  Activity Tolerance: Patient tolerated treatment well Patient left: in chair;with call bell/phone within reach   Time: 1027-1102 OT Time Calculation (min): 35 min Charges:  OT General Charges $OT Visit: 1 Procedure OT Evaluation $Initial OT Evaluation Tier I: 1 Procedure OT Treatments $Self Care/Home Management : 8-22 mins G-CodesBenito Mccreedy OTR/L [phone] 12/03/2013, 1:39 PM

## 2013-12-03 NOTE — Progress Notes (Signed)
[place] TEAM 1 - Stepdown/ICU TEAM Progress Note  Travis Chapman PNT:[id number] DOB: 11-10-1945 DOA: 12/02/2013 PCP: Renato Shin, MD  Admit HPI / Brief Narrative: 68 y.o. male with history of obesity hypoventilation syndrome status post tracheostomy on nightly vent, diabetes mellitus with diabetic neuropathy, chronic diastolic heart failure, hypertension, physical deconditioning, and chronic anemia who presented with 3 falls in 3 days. He has been living at home with his daughter.   He was hospitalized in September with similar complaints and at that point was recommended to be discharged to an LTAC but he decided to go home instead and work with home health and home physical therapy.  He has been doing so throughout the month of October without any significant improvement.  He then started having recurrent falls due to generalized weakness of both legs.  HPI/Subjective: Pt denies cp, n/v, abdom pain, or current sob.  He reports ongoing generalized weakness.  He tells me this morning that he is open to the idea of returning to [facility] ([place] for a prolonged rehab stay.    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   CKD stage III  crt is at baseline   Chronic diastolic heart failure  Does not appear to be grossly volume overloaded at present   DM w/ renal manifestations  Follow CBGs - currently reasonably controlled  HTN currently reasonably controlled  HLD Cont zocor  Hx of PE / DVT  INR subtherapeutic on admission  Code Status: NO CODE BLUE  Family Communication: no family present at time of exam Disposition Plan: SDU (on  chronic vent)  Consultants: PCCM - vent management   Procedures: none  Antibiotics: none  DVT prophylaxis: Warfarin   Objective: Blood pressure 128/71, pulse 69, temperature 99.3 F (37.4 C), temperature source Oral, resp. rate 14, height 6\' 2"  (1.88 m), weight 160.3 kg (353 lb 6.4 oz), SpO2 100 %.  Intake/Output Summary (Last 24 hours) at 12/03/13 1001 Last data filed at 12/03/13 0800  Gross per 24 hour  Intake    125 ml  Output   1500 ml  Net  -1375 ml   Exam: General: No acute respiratory distress - alert and oriented  Lungs: Clear to auscultation bilaterally without wheezes or crackles but w/ distant bs th/o  Cardiovascular: Regular rate and rhythm without murmur gallop or rub - distant HS  Abdomen: Morbildly obese, nontender, nondistended, soft, bowel sounds positive, no rebound, no ascites, no appreciable mass Extremities: No significant cyanosis, clubbing;  1+ chronic appearing edema bilateral lower extremities  Data Reviewed: Basic Metabolic Panel:  Recent Labs Lab 12/02/13 1707 12/02/13 2125 12/03/13 0613  NA 138  --  139  K 4.9  --  4.4  CL 102  --  103  CO2 27  --  25  GLUCOSE 183*  --  149*  BUN 21  --  19  CREATININE 1.54*  --  1.52*  CALCIUM 9.3  --  8.8  MG  --  2.3  --   PHOS  --  3.4  --     Liver Function Tests:  Recent Labs Lab 12/02/13 1707 12/03/13 0613  AST 22 20  ALT 11 9  ALKPHOS 70 59  BILITOT 0.5 0.4  PROT 7.3 6.3  ALBUMIN 3.1* 2.8*    Recent Labs Lab 12/02/13 1707  LIPASE 16    Recent Labs Lab 12/02/13 1707  AMMONIA 31    Coags:  Recent Labs Lab 12/02/13 2125 12/03/13 0121  INR 1.62* 1.57*   CBC:  Recent Labs Lab 12/02/13 1707 12/03/13 0613  WBC 7.3 5.9  NEUTROABS 6.0 4.4  HGB 11.6* 10.5*  HCT 35.4* 32.8*  MCV 85.1 85.4  PLT 192 187    Cardiac Enzymes:  Recent Labs Lab 12/02/13 1707 12/02/13 2125  CKTOTAL  --  370*  TROPONINI <0.30  --    BNP (last 3 results)  Recent Labs   08/03/13 1600 09/24/13 1701 12/03/13 0121  PROBNP 844.9* 7.4 1519.0*    CBG:  Recent Labs Lab 12/02/13 2338 12/03/13 0752  GLUCAP 122* 143*    Recent Results (from the past 240 hour(s))  Culture, respiratory (NON-Expectorated)     Status: None (Preliminary result)   Collection Time: 12/02/13  9:15 PM  Result Value Ref Range Status   Specimen Description SPUTUM  Final   Special Requests NONE  Final   Gram Stain PENDING  Incomplete   Culture   Final    Culture reincubated for better growth Performed at [facility]    Report Status PENDING  Incomplete  MRSA PCR Screening     Status: None   Collection Time: 12/03/13 12:16 AM  Result Value Ref Range Status   MRSA by PCR NEGATIVE NEGATIVE Final    Comment:        The GeneXpert MRSA Assay (FDA approved for NASAL specimens only), is one component of a comprehensive MRSA colonization surveillance program. It is not intended to diagnose MRSA infection nor to guide or monitor treatment for MRSA infections.      Studies:  Recent x-ray studies have been reviewed in detail by the Attending Physician  Scheduled Meds:  Scheduled Meds: . amLODipine  10 mg Oral Daily  . antiseptic oral rinse  7 mL Mouth Rinse QID  . carvedilol  6.25 mg Oral BID WC  . chlorhexidine  15 mL Mouth Rinse BID  . citalopram  20 mg Oral Daily  . cloNIDine  0.1 mg Oral BID  . docusate sodium  100 mg Oral BID  . furosemide  60 mg Oral Daily  . heparin  5,000 Units Subcutaneous 3 times per day  . insulin aspart  0-15 Units Subcutaneous TID WC  . insulin aspart  0-5 Units Subcutaneous QHS  . insulin glargine  28 Units Subcutaneous q morning - 10a  . losartan  50 mg Oral Daily  . metoCLOPramide  5 mg Oral TID  . polyethylene glycol  17 g Oral Daily  . potassium chloride SA  40 mEq Oral Daily  . senna-docusate  1 tablet Oral BID  . sodium chloride  3 mL Intravenous Q12H  . Warfarin - Pharmacist Dosing Inpatient   Does not apply q1800     Time spent on care of this patient: 35 mins   [facility] T , MD   [facility] Office  [phone] Pager - Text Page per Amion as per below:  On-Call/Text Page:      [email or web address]      password [email or web address]  If 7PM-7AM, please contact night-coverage [email or web address] Password [email or web address] 12/03/2013, 10:01 AM   LOS: 1 day

## 2013-12-03 NOTE — Clinical Social Work Note (Signed)
Clinical Education officer, museum received referral for LTAC placement.  CSW has notified CM who who is able to make referral for LTAC placement if patient is appropriate.  CM to address today with staff and family as needed.  Per chart, patient from home with home health and her daughter and refused LTAC placement with previous hospital admission.  CSW signing off at this time.  Please reconsult if further social work needs arise prior to discharge.  Barbette Or, [place]

## 2013-12-04 DIAGNOSIS — R0602 Shortness of breath: Secondary | ICD-10-CM

## 2013-12-04 DIAGNOSIS — J9612 Chronic respiratory failure with hypercapnia: Secondary | ICD-10-CM

## 2013-12-04 LAB — RENAL FUNCTION PANEL
Albumin: 2.6 g/dL — ABNORMAL LOW (ref 3.5–5.2)
Anion gap: 14 (ref 5–15)
BUN: 18 mg/dL (ref 6–23)
CO2: 22 mEq/L (ref 19–32)
Calcium: 8.6 mg/dL (ref 8.4–10.5)
Chloride: 102 mEq/L (ref 96–112)
Creatinine, Ser: 1.54 mg/dL — ABNORMAL HIGH (ref 0.50–1.35)
GFR calc Af Amer: 52 mL/min — ABNORMAL LOW (ref >90)
GFR calc non Af Amer: 45 mL/min — ABNORMAL LOW (ref >90)
GLUCOSE: 146 mg/dL — AB (ref 70–99)
PHOSPHORUS: 3.2 mg/dL (ref 2.3–4.6)
POTASSIUM: 4 meq/L (ref 3.7–5.3)
Sodium: 138 mEq/L (ref 137–147)

## 2013-12-04 LAB — CBC
HCT: 33.2 % — ABNORMAL LOW (ref 39.0–52.0)
Hemoglobin: 10.7 g/dL — ABNORMAL LOW (ref 13.0–17.0)
MCH: 27.4 pg (ref 26.0–34.0)
MCHC: 32.2 g/dL (ref 30.0–36.0)
MCV: 84.9 fL (ref 78.0–100.0)
Platelets: 178 10*3/uL (ref 150–400)
RBC: 3.91 MIL/uL — AB (ref 4.22–5.81)
RDW: 13.2 % (ref 11.5–15.5)
WBC: 6 10*3/uL (ref 4.0–10.5)

## 2013-12-04 LAB — GLUCOSE, CAPILLARY
GLUCOSE-CAPILLARY: 139 mg/dL — AB (ref 70–99)
Glucose-Capillary: 131 mg/dL — ABNORMAL HIGH (ref 70–99)
Glucose-Capillary: 141 mg/dL — ABNORMAL HIGH (ref 70–99)

## 2013-12-04 LAB — PROTIME-INR
INR: 2.09 — ABNORMAL HIGH (ref 0.00–1.49)
Prothrombin Time: 23.7 seconds — ABNORMAL HIGH (ref 11.6–15.2)

## 2013-12-04 MED ORDER — SIMVASTATIN 20 MG PO TABS
20.0000 mg | ORAL_TABLET | Freq: Every day | ORAL | Status: DC
  Administered 2013-12-04 – 2013-12-16 (×13): 20 mg via ORAL
  Filled 2013-12-04 (×14): qty 1

## 2013-12-04 MED ORDER — WARFARIN SODIUM 5 MG PO TABS
5.0000 mg | ORAL_TABLET | Freq: Once | ORAL | Status: AC
  Administered 2013-12-04: 5 mg via ORAL
  Filled 2013-12-04: qty 1

## 2013-12-04 NOTE — Progress Notes (Addendum)
Trach Care Progression Note   Patient Details Name: Travis Chapman MRN: [id number] DOB: April 21, 1945 Today's Date: 12/04/2013   Tracheostomy Assessment    Tracheostomy Shiley 7 mm Cuffed (Active)  Status Secured 12/04/2013 12:04 PM  Site Assessment Clean;Dry 12/04/2013 12:04 PM  Site Care Cleansed;Dried;Dressing applied 12/04/2013  3:55 AM  Inner Cannula Care Changed/new 12/04/2013  8:22 AM  Ties Assessment Clean 12/04/2013 12:04 PM  Cuff pressure (cm) 0 cm 12/04/2013 12:04 PM  Emergency Equipment at bedside Yes 12/04/2013 12:04 PM     Care Needs  Pt caring for trach well at home independently. Does not attend trach clinic. Sees an ENT.    Respiratory Therapy Tracheostomy: Chronic trach O2 Device: Not Delivered FiO2 (%): 40 % SpO2: 90 % Education:  (not needed)    Speech Language Pathology  SLP chart review complete: Patient does not need SLP services at this time (long term trach, politely refused pmsv)   Physical Therapy PT Recommendation/Assessment: Patient needs continued PT services Follow Up Recommendations: SNF (with vent) PT equipment: None recommended by PT    Occupational Therapy OT Recommendation/Assessment: Patient needs continued OT Services Follow Up Recommendations: SNF, Supervision/Assistance - 24 hour OT Equipment: Other (comment) (defer to next venue)    Nutritional Patient's Current Diet: Carb modified    Case Management/Social Work  Per report pt refused LTACH, wants to go home.        Provider Marni Griffon, NP present      Jashan Cotten, Katherene Ponto 12/04/2013, 2:05 PM

## 2013-12-04 NOTE — Progress Notes (Signed)
VASCULAR LAB PRELIMINARY  PRELIMINARY  PRELIMINARY  PRELIMINARY  Bilateral lower extremity venous Dopplers completed.    Preliminary report:  There is no obvious evidence of DVT or SVT noted in the bilateral lower extremities.   Jermey Closs, RVT 12/04/2013, 10:47 AM

## 2013-12-04 NOTE — Progress Notes (Signed)
Name: Travis Chapman MRN: [id number] DOB: May 09, 1945    ADMISSION DATE:  12/02/2013 CONSULTATION DATE:  12/02/2013  REFERRING MD :  TRH  CHIEF COMPLAINT:  Mechanical fall, chronic respiratory failure s/p tracheostomy  BRIEF PATIENT DESCRIPTION: 68 y.o. M with chronic trach (diurnal ATC, noctural vent) for chronic resp failure due to OSA/OHS, brought to [facility] ED 11/17 after he suffered a few mechanical falls while at home.  Fully asymptomatic; however, admitted overnight by [facility] for observation.  PCCM consulted for vent management.  SIGNIFICANT EVENTS  11/17 - admitted  STUDIES:  CT head 11/17 >>> negative  SUBJECTIVE: No events overnight.  VITAL SIGNS: Temp:  [97.8 F (36.6 C)-99 F (37.2 C)] 98.3 F (36.8 C) (11/19 0727) Pulse Rate:  [63-78] 74 (11/19 0822) Resp:  [11-29] 22 (11/19 0822) BP: (112-150)/(47-77) 126/54 mmHg (11/19 0822) SpO2:  [92 %-100 %] 93 % (11/19 0917) FiO2 (%):  [40 %] 40 % (11/19 0426) Weight:  [158.578 kg (349 lb 9.6 oz)] 158.578 kg (349 lb 9.6 oz) (11/19 0407)  PHYSICAL EXAMINATION: General: Morbidly obese male, resting in bed, in NAD. Neuro: A&O x 3, non-focal.  HEENT: [place]/AT. PERRL, sclerae anicteric.  Has chronic tracheostomy. Cardiovascular: RRR, no M/R/G appreciated. Lungs: Respirations even and unlabored.  CTA bilaterally, No W/R/R.  Chronic trach on ATC currently. Abdomen: Obese, BS x 4, soft, NT/ND.  Musculoskeletal: Chronic venous stasis changes bilaterally, no edema.  Skin: Intact, warm, no rashes.  Recent Labs Lab 12/02/13 1707 12/03/13 0613 12/04/13 0349  NA 138 139 138  K 4.9 4.4 4.0  CL 102 103 102  CO2 27 25 22   BUN 21 19 18   CREATININE 1.54* 1.52* 1.54*  GLUCOSE 183* 149* 146*    Recent Labs Lab 12/02/13 1707 12/03/13 0613 12/04/13 0349  HGB 11.6* 10.5* 10.7*  HCT 35.4* 32.8* 33.2*  WBC 7.3 5.9 6.0  PLT 192 187 178   Ct Head Wo Contrast  12/02/2013   CLINICAL DATA:  Mental status change.  EXAM: CT HEAD WITHOUT  CONTRAST  TECHNIQUE: Contiguous axial images were obtained from the base of the skull through the vertex without intravenous contrast.  COMPARISON:  CT 08/25/1998  FINDINGS: No acute intracranial hemorrhage. No focal mass lesion. No CT evidence of acute infarction. No midline shift or mass effect. No hydrocephalus. Generalized cortical atrophy. Basilar cisterns are patent. Paranasal sinuses and mastoid air cells are clear. Right globe prosthetic  IMPRESSION: 1. No acute intracranial findings. 2. Generalized cortical.   Electronically Signed   By: Suzy Bouchard M.D.   On: 12/02/2013 18:04   Dg Chest Port 1 View  12/02/2013   CLINICAL DATA:  Fever. Unable to give history. History of hypertension and asthma. Patient has a tracheostomy.  EXAM: PORTABLE CHEST - 1 VIEW  COMPARISON:  09/24/2013  FINDINGS: Left lung base not completely imaged on this exam. Allowing for this, lungs are clear. No pneumothorax. Cardiac silhouette is mildly enlarged. No mediastinal or hilar masses. Tracheostomy tube is stable and well positioned.  IMPRESSION: No acute cardiopulmonary disease.   Electronically Signed   By: Lajean Manes M.D.   On: 12/02/2013 16:38    ASSESSMENT / PLAN:  Chronic tracheostomy status - vent dependent nocturnally, ATC diurnally. Chronic respiratory failure due to OSA / OSH Hx PE - on coumadin, INR subtherapeutic on admission Recs: Continue nocturnal vent support (per last discharge summary, prior settings were PRVC, Rate 14, VT 600, PEEP 5). Continue diurnal ATC. Pulmonary hygiene. Continue coumadin per pharmacy. PRN  bronchodilators. Titrate O2 down as able for sat of 88-92%.  Generalized deconditioning Mechanical fall Recs: PT / OT consult in AM.  All other problems, per primary team.  Rush Farmer, M.D. [facility] Pulmonary/Critical Care Medicine. Pager: [phone]. After hours pager: [phone].

## 2013-12-04 NOTE — Plan of Care (Signed)
Problem: Problem: Mobility Progression Goal: ABLE TO TURN SELF Outcome: Progressing Goal: ABLE TO PERFORM ADLS Outcome: Progressing

## 2013-12-04 NOTE — Progress Notes (Signed)
[place] TEAM 1 - Stepdown/ICU TEAM Progress Note  Shermaine Brigham WUJ:[id number] DOB: [birth year] DOA: 12/02/2013 PCP: Renato Shin, MD  Admit HPI / Brief Narrative: 68 y.o. 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 On previous discharge patient was to be placed in SNF.  Patient has complex hx of obesity hypoventilation syndrome with chronic respiratory failure tracheostomy dependent and ventilator dependent at night.  The patient is presenting with 3 falls in the last 3 days. Lives at home with his daughter. He was recently hospitalized in September with similar situation and at that point was recommended to be discharged at [facility] but he decided to go home instead and works with home health and home physical therapy. He has been working with home health and home physical therapy throughout the month of October without any significant improvement. Last week he started having recurrent fall in the fall he describes are due to generalized weakness of bilateral lower legs. Patient denies any focal deficit head injury and neck injury a pleasant episode. Patient denies any chest pain or tachycardia. Patient mentions he is taking all his medications regularly and there is no recent change in his medication. Patient also denies any complaint of fever, chills, nausea, vomiting, abdominal pain, diarrhea, burning urination.  The patient is coming from home And at his baseline independent for most of his ADL.   HPI/Subjective: 11/19 A/O 4,Patient states he understands that it is not safe for him to stay at home alone and that he requires SNF.  Assessment/Plan: Generalized weakness / deconditioned state -Recurring problem, agrees to SNF  facility   CKD stage III  -baseline creatinine 1.3-2  - 11/19 Creatinine= 1. 54, within patient's baseline   Chronic diastolic heart failure  -EF 50-55% via TTE April 2015, w/ grade 1 DD  - Admission weight 158.5 kg, ; base weight ~160.6 kg  -continue strict in and out -Daily a.m. weight  Morbid obesity - Body mass index is 43.52 kg/(m^2).   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  -CBG reasonably controlled at present  - 9/10 hemoglobin A1c = 7.3  HTN - uncontrolled  -Continue amlodipine 10 mg daily  -Continue Coreg 6.25 mg BID  -Continue clonidine 0.1 mg BID  -Continue Lasix 60 mg daily -Continue losartan 50 mg daily   HLD  -Continue Zocor 20 mg daily   Severe B knee pain  -Chronic, no change from previous admission  - PT/OT; recommends SNF   - pain control    - Code Status: FULL  Family Communication: no family present at time of exam  Disposition Plan: Patient has agreed to SNF      Consultants: NA  Procedure/Significant Events: 4/12 echocardiogram;- Left ventricle: mildly dilated. mild LVH.- LVEF= 50% to 55%.  -(grade 1 diastolic dysfunction).   Culture   NA  Antibiotics: NA  DVT prophylaxis: Coumadin  Devices Chronic trach present  LINES / TUBES:     Continuous Infusions:   Objective:  VITAL SIGNS: Temp: 98.5 F (36.9 C) (11/19 1204) Temp Source: Oral (11/19 1204) BP: 129/61 mmHg (11/19 1204) Pulse Rate: 60 (11/19 1204) SPO2; FIO2:   Intake/Output Summary (Last 24 hours) at 12/04/13 1233 Last data filed at 12/04/13 0930  Gross per 24 hour  Intake    840 ml    Output   1370 ml  Net   -530 ml     Exam: General: A/O 4, NAD, sitting in chair on room air, No acute respiratory distress, tracheostomy in place negative sign of infection. Lungs: Clear to auscultation bilaterally without wheezes or crackles Cardiovascular: Regular rate and rhythm without murmur gallop or rub normal S1 and S2 Abdomen: Morbidly obese, Nontender, nondistended, soft, bowel sounds positive, no rebound, no ascites, no appreciable mass Extremities: Bilateral lower extremity venous stasis ulcers which are healing well (eschar over both), No significant cyanosis, clubbing, or edema bilateral lower extremities  Data Reviewed: Basic Metabolic Panel:  Recent Labs Lab 12/02/13 1707 12/02/13 2125 12/03/13 0613 12/04/13 0349  NA 138  --  139 138  K 4.9  --  4.4 4.0  CL 102  --  103 102  CO2 27  --  25 22  GLUCOSE 183*  --  149* 146*  BUN 21  --  19 18  CREATININE 1.54*  --  1.52* 1.54*  CALCIUM 9.3  --  8.8 8.6  MG  --  2.3  --   --   PHOS  --  3.4  --  3.2   Liver Function Tests:  Recent Labs Lab 12/02/13 1707 12/03/13 0613 12/04/13 0349  AST 22 20  --   ALT 11 9  --   ALKPHOS 70 59  --   BILITOT 0.5 0.4  --   PROT 7.3 6.3  --   ALBUMIN 3.1* 2.8* 2.6*    Recent Labs Lab 12/02/13 1707  LIPASE 16    Recent Labs Lab 12/02/13 1707  AMMONIA 31   CBC:  Recent Labs Lab 12/02/13 1707 12/03/13 0613 12/04/13 0349  WBC 7.3 5.9 6.0  NEUTROABS 6.0 4.4  --   HGB 11.6* 10.5* 10.7*  HCT 35.4* 32.8* 33.2*  MCV 85.1 85.4 84.9  PLT 192 187 178   Cardiac Enzymes:  Recent Labs Lab 12/02/13 1707 12/02/13 2125  CKTOTAL  --  370*  TROPONINI <0.30  --    BNP (last 3 results)  Recent Labs  08/03/13 1600 09/24/13 1701 12/03/13 0121  PROBNP 844.9* 7.4 1519.0*   CBG:  Recent Labs Lab 12/03/13 0752 12/03/13 1157 12/03/13 1556 12/03/13 2139 12/04/13 0727  GLUCAP 143* 158* 132* 132* 131*    Recent Results (from the past 240 hour(s))   Urine culture     Status: None   Collection Time: 12/02/13  5:01 PM  Result Value Ref Range Status   Specimen Description URINE, CATHETERIZED  Final   Special Requests NONE  Final   Culture  Setup Time   Final    12/02/2013 20:00 Performed at [facility] Performed at [facility]   Final   Culture NO GROWTH Performed at [facility]   Final   Report Status 12/03/2013 FINAL  Final  Culture, blood (routine x 2)     Status: None (Preliminary result)   Collection Time: 12/02/13  5:07 PM  Result Value Ref Range Status   Specimen Description BLOOD RIGHT FOREARM  Final   Special Requests BOTTLES DRAWN AEROBIC AND  ANAEROBIC 10CC  Final   Culture  Setup Time   Final    12/03/2013 00:26 Performed at [facility]    Culture   Final    [place] IN CLUSTERS Note: Gram Stain Report Called to,Read Back By and Verified With: MICHELLE MCMILLAN ON 12/03/2013 AT 9:22P BY WILEJ Performed at [facility]    Report Status PENDING  Incomplete  Culture, blood (routine x 2)     Status: None (Preliminary result)   Collection Time: 12/02/13  6:11 PM  Result Value Ref Range Status   Specimen Description BLOOD LEFT HAND  Final   Special Requests   Final    BOTTLES DRAWN AEROBIC AND ANAEROBIC 5CC AER,2CC ANA   Culture  Setup Time   Final    12/03/2013 00:47 Performed at [facility]    Culture   Final           BLOOD CULTURE RECEIVED NO GROWTH TO DATE CULTURE WILL BE HELD FOR 5 DAYS BEFORE ISSUING A FINAL NEGATIVE REPORT Performed at [facility]    Report Status PENDING  Incomplete  Culture, respiratory (NON-Expectorated)     Status: None (Preliminary result)   Collection Time: 12/02/13  9:15 PM  Result Value Ref Range Status   Specimen Description SPUTUM  Final   Special Requests NONE  Final   Gram Stain   Final    MODERATE WBC PRESENT, PREDOMINANTLY PMN MODERATE SQUAMOUS EPITHELIAL CELLS  PRESENT FEW GRAM POSITIVE COCCI IN PAIRS IN CHAINS FEW GRAM POSITIVE RODS FEW GRAM NEGATIVE COCCI    Culture   Final    NORMAL OROPHARYNGEAL FLORA Performed at [facility]    Report Status PENDING  Incomplete  MRSA PCR Screening     Status: None   Collection Time: 12/03/13 12:16 AM  Result Value Ref Range Status   MRSA by PCR NEGATIVE NEGATIVE Final    Comment:        The GeneXpert MRSA Assay (FDA approved for NASAL specimens only), is one component of a comprehensive MRSA colonization surveillance program. It is not intended to diagnose MRSA infection nor to guide or monitor treatment for MRSA infections.      Studies:  Recent x-ray studies have been reviewed in detail by the Attending Physician  Scheduled Meds:  Scheduled Meds: . amLODipine  10 mg Oral Daily  . antiseptic oral rinse  7 mL Mouth Rinse QID  . carvedilol  6.25 mg Oral BID WC  . chlorhexidine  15 mL Mouth Rinse BID  . citalopram  20 mg Oral Daily  . cloNIDine  0.1 mg Oral BID  . docusate sodium  100 mg Oral BID  . furosemide  60 mg Oral Daily  . heparin  5,000 Units Subcutaneous 3 times per day  . insulin aspart  0-15 Units Subcutaneous TID WC  . insulin aspart  0-5 Units Subcutaneous QHS  . insulin glargine  28 Units Subcutaneous q morning - 10a  . losartan  50 mg Oral Daily  . metoCLOPramide  5 mg Oral TID  . piperacillin-tazobactam (ZOSYN)  IV  3.375 g Intravenous 3 times per day  . polyethylene glycol  17 g Oral Daily  . potassium chloride SA  40 mEq Oral Daily  . senna-docusate  1 tablet Oral BID  . sodium chloride  3 mL Intravenous Q12H  . vancomycin  2,000 mg Intravenous Q24H  . warfarin  5 mg Oral ONCE-1800  . Warfarin - Pharmacist Dosing  Inpatient   Does not apply q1800    Time spent on care of this patient: 40 mins   Allie Bossier , MD   [facility] Office  [phone] Pager [phone]  On-Call/Text Page:      [email or web address]      password [email or web address]  If  7PM-7AM, please contact night-coverage [email or web address] Password [email or web address] 12/04/2013, 12:33 PM   LOS: 2 days

## 2013-12-04 NOTE — Progress Notes (Signed)
Patient requested to come of Vent.  Patient refused trach collar.  States he is on 21% at home.

## 2013-12-04 NOTE — Progress Notes (Signed)
ANTICOAGULATION CONSULT NOTE - Follow Up Consult  Pharmacy Consult for Warfarin Indication: pulmonary embolus  No Known Allergies  Patient Measurements: Height: 6\' 2"  (188 cm) Weight: (!) 349 lb 9.6 oz (158.578 kg) IBW/kg (Calculated) : 82.2  Vital Signs: Temp: 98.3 F (36.8 C) (11/19 0727) Temp Source: Oral (11/19 0727) BP: 126/54 mmHg (11/19 0822) Pulse Rate: 74 (11/19 0822)  Labs:  Recent Labs  12/02/13 1707 12/02/13 2125 12/03/13 0121 12/03/13 0613 12/04/13 0349  HGB 11.6*  --   --  10.5* 10.7*  HCT 35.4*  --   --  32.8* 33.2*  PLT 192  --   --  187 178  LABPROT  --  19.4* 19.0*  --  23.7*  INR  --  1.62* 1.57*  --  2.09*  CREATININE 1.54*  --   --  1.52* 1.54*  CKTOTAL  --  370*  --   --   --   TROPONINI <0.30  --   --   --   --     Estimated Creatinine Clearance: 74.3 mL/min (by C-G formula based on Cr of 1.54).   Assessment: 34 YOM admitted on 11/17 with generalized weakness and falls. The patient was on warfarin PTA for hx PE and was noted to have a SUBtherapeutic INR on admission of 1.62. INR today is 2.09 (up from 1.57 on 11/18). Venous doppler pending to r/o DVT.  Home coumadin dose: 5 mg daily EXCEPT for 10 mg on MWF  Goal of Therapy:  INR 2-3   Plan:  - Warfarin 5 mg x 1 dose at 1800 today -Discontinue heparin sq with INR > 2.0 - Daily PT/INR  Hildred Laser, Pharm D 12/04/2013 8:39 AM

## 2013-12-04 NOTE — Progress Notes (Signed)
Trach consult note:  Pt seen at this time for trach consult.  Pt states he has chronic trach in place since 2006. Vent QHS, RA during day. #7XLT secure and in place cuff deflated.  Lives at home with mother.  No education needed at this time. Will continue to follow pt as needed.

## 2013-12-05 DIAGNOSIS — W19XXXD Unspecified fall, subsequent encounter: Secondary | ICD-10-CM

## 2013-12-05 LAB — GLUCOSE, CAPILLARY
GLUCOSE-CAPILLARY: 111 mg/dL — AB (ref 70–99)
Glucose-Capillary: 139 mg/dL — ABNORMAL HIGH (ref 70–99)
Glucose-Capillary: 176 mg/dL — ABNORMAL HIGH (ref 70–99)
Glucose-Capillary: 161 mg/dL — ABNORMAL HIGH (ref 70–99)
Glucose-Capillary: 157 mg/dL — ABNORMAL HIGH (ref 70–99)

## 2013-12-05 LAB — CULTURE, RESPIRATORY

## 2013-12-05 LAB — PROTIME-INR
INR: 2.22 — AB (ref 0.00–1.49)
Prothrombin Time: 24.8 seconds — ABNORMAL HIGH (ref 11.6–15.2)

## 2013-12-05 LAB — CULTURE, RESPIRATORY W GRAM STAIN: Culture: NORMAL

## 2013-12-05 MED ORDER — WARFARIN SODIUM 10 MG PO TABS
10.0000 mg | ORAL_TABLET | Freq: Once | ORAL | Status: AC
  Administered 2013-12-05: 10 mg via ORAL
  Filled 2013-12-05: qty 1

## 2013-12-05 NOTE — Clinical Social Work Psychosocial (Signed)
Clinical Social Work Department BRIEF PSYCHOSOCIAL ASSESSMENT 12/05/2013  Patient:  Travis Chapman, Travis Chapman     Account Number:  [id number]     Admit date:  12/02/2013  Clinical Social Worker:  Domenica Reamer, [place]  Date/Time:  12/05/2013 03:43 PM  Referred by:  Physician  Date Referred:  12/05/2013 Referred for  SNF Placement   Other Referral:   Interview type:  Patient Other interview type:    PSYCHOSOCIAL DATA Living Status:  FAMILY Admitted from facility:   Level of care:   Primary support name:  Travis Chapman Primary support relationship to patient:  SIBLING Degree of support available:   Patient reports high level of support- patient has 67 daughters who live locally and check on him everyday as well as a brother who lives close and checks in.  Patient lives with his mother who is 28 years old.    CURRENT CONCERNS Current Concerns  Post-Acute Placement   Other Concerns:    SOCIAL WORK ASSESSMENT / PLAN CSW spoke with patient at bedside concerning Vent SNF placement.  Patient has been spoken to by physicians about going to Vent SNF- patient has been to [facility] in the past and is agreeable to returning.  Patient is frequently readmitted to the hospital due to falls at home and states that he understands that he might not be safe at home and that his elderly mother is unable to properly care for him. Patient is not happy about going to Vent SNF but is willing to move forward with search at this time. Patient is not agreeable to faclities outside of [place] and [place].   Assessment/plan status:  Psychosocial Support/Ongoing Assessment of Needs Other assessment/ plan:   FL2   Information/referral to community resources:   [facility]    PATIENT'S/FAMILY'S RESPONSE TO PLAN OF CARE: Patient is agreeable to bed search out of necessity but would rather return home.       Domenica Reamer, [place] Social Worker [phone]

## 2013-12-05 NOTE — Progress Notes (Signed)
ANTICOAGULATION CONSULT NOTE - Follow Up Consult  Pharmacy Consult for Warfarin Indication: Hx PE  No Known Allergies  Patient Measurements: Height: 6\' 2"  (188 cm) Weight: (!) 349 lb 3.2 oz (158.396 kg) IBW/kg (Calculated) : 82.2  Vital Signs: Temp: 98.2 F (36.8 C) (11/20 1220) Temp Source: Oral (11/20 0730) BP: 117/65 mmHg (11/20 0423) Pulse Rate: 63 (11/20 1220)  Labs:  Recent Labs  12/02/13 1707  12/02/13 2125 12/03/13 0121 12/03/13 0613 12/04/13 0349 12/05/13 0316  HGB 11.6*  --   --   --  10.5* 10.7*  --   HCT 35.4*  --   --   --  32.8* 33.2*  --   PLT 192  --   --   --  187 178  --   LABPROT  --   < > 19.4* 19.0*  --  23.7* 24.8*  INR  --   < > 1.62* 1.57*  --  2.09* 2.22*  CREATININE 1.54*  --   --   --  1.52* 1.54*  --   CKTOTAL  --   --  370*  --   --   --   --   TROPONINI <0.30  --   --   --   --   --   --   < > = values in this interval not displayed.  Estimated Creatinine Clearance: 74.2 mL/min (by C-G formula based on Cr of 1.54).   Assessment: 21 YOM admitted on 11/17 with generalized weakness and falls. The patient was on warfarin PTA for hx PE and was noted to have a SUBtherapeutic INR on admission of 1.62 on PTA dose of 5 mg daily EXCEPT for 10 mg on MWF.   INR today remains therapeutic (INR 2.22 << 2.09, goal of 2-3). No CBC today, stable from 11/19. No overt s/sx of bleeding noted.   Goal of Therapy:  INR 2-3   Plan:  1. Warfarin 10 mg x 1 dose at 1800 today 2. Will continue to monitor for any signs/symptoms of bleeding and will follow up with PT/INR in the a.m.   Alycia Rossetti, PharmD, BCPS Clinical Pharmacist Pager: [phone] 12/05/2013 2:11 PM

## 2013-12-05 NOTE — Plan of Care (Signed)
Problem: Problem: Mobility Progression Goal: INCREASED MOBILITY OR STRENGTH Outcome: Progressing Goal: IMPROVED WEIGHT-BEARING STATUS Outcome: Progressing Goal: GETS OUT OF BED Outcome: Completed/Met Date Met:  12/05/13

## 2013-12-05 NOTE — Care Management Note (Signed)
    Page 1 of 1   12/05/2013     2:55:49 PM CARE MANAGEMENT NOTE 12/05/2013  Patient:  DEMICHAEL, TRAUM   Account Number:  [id number]  Date Initiated:  12/05/2013  Documentation initiated by:  [place]  Subjective/Objective Assessment:   dx weakness/falls; lives with mother, dtrs and brother live nearby; on nocturnal vent through [facility]     Action/Plan:   Anticipated DC Date:  12/09/2013   Anticipated DC Plan:  [place]  In-house referral  Clinical Social Worker      DC Planning Services  CM consult      Choice offered to / List presented to:             Status of service:  In process, will continue to follow Medicare Important Message given?  YES (If response is "NO", the following Medicare IM given date fields will be blank) Date Medicare IM given:  12/05/2013 Medicare IM given by:  Jatasia Gundrum Date Additional Medicare IM given:   Additional Medicare IM given by:    Discharge Disposition:    Per UR Regulation:  Reviewed for med. necessity/level of care/duration of stay  If discussed at [place] of Stay Meetings, dates discussed:    Comments:

## 2013-12-05 NOTE — Clinical Social Work Note (Signed)
CSW spoke with Marzetta Board at [facility] who stated that patient has been with [facility] before and owes them a large sum of money ($5,000-6,000).  Marzetta Board stated that patient was great while at the facility but they would need to figure out the financial piece before patient could return.    Marzetta Board stated that patient was supposed to complete Medicaid application which would have paid for patients stay but after DC from facility patient did not finish application.  CSW called financial counseling to make referral for Medicaid- left message.  CSW will continue to follow.  Domenica Reamer, [place] Social Worker [phone]

## 2013-12-05 NOTE — Progress Notes (Signed)
Occupational Therapy Treatment Patient Details Name: Travis Chapman MRN: [id number] DOB: 16-Dec-1945 Today's Date: 12/05/2013    History of present illness Pt adm with weakness, deconditioning and incr falls. PMHx-Long term trach - on vent at night. Morbid obesity, DM, CHF, severe arthritis of knees with frequent falls and 7 admissions since 2/15 for falls   OT comments  Pt with significant improvement today.  Pt transferred to chair with +2 mod assist and a walker.  No lift was necessary today.  Pt completed LE adls with most assist needed in area of doing adls in standing.  Pt unable to let go of the walker to bathe back side or peri area at this time due to decreased strength and balance. Pt making good progress toward goals.  Follow Up Recommendations  SNF;Supervision/Assistance - 24 hour    Equipment Recommendations  Other (comment)    Recommendations for Other Services      Precautions / Restrictions Precautions Precautions: Fall Restrictions Weight Bearing Restrictions: No       Mobility Bed Mobility               General bed mobility comments: pt on EOB on arrival.  Transfers Overall transfer level: Needs assistance Equipment used: Rolling walker (2 wheeled);2 person hand held assist Transfers: Sit to/from [facility] Sit to Stand: +2 physical assistance;Mod assist Stand pivot transfers: +2 physical assistance;Mod assist       General transfer comment: pt with increase independence during transfers today.    Balance Overall balance assessment: Needs assistance Sitting-balance support: No upper extremity supported;Feet supported Sitting balance-Leahy Scale: Fair     Standing balance support: Bilateral upper extremity supported;During functional activity Standing balance-Leahy Scale: Zero Standing balance comment: pt must have walker And outside assist to remain standing.                   ADL Overall ADL's : Needs  assistance/impaired     Grooming: Wash/dry hands;Wash/dry face;Oral care;Applying deodorant;Set up;Sitting Grooming Details (indicate cue type and reason): Pt up to chair today for adls and very participatory.  pt able to manage adls from chair very well. Upper Body Bathing: Set up;Supervision/ safety;Sitting   Lower Body Bathing: Maximal assistance;Sit to/from stand Lower Body Bathing Details (indicate cue type and reason): Pt with great progress in this area.  pt in chair for adls and was able to transfer sit to stand with +2 assist and required assist for bottom while he stood with min guard and for B feet in sitting.  With long handled sponge pt would be able to manage feet w/o assist. Upper Body Dressing : Set up;Sitting Upper Body Dressing Details (indicate cue type and reason): donned gown.     Toilet Transfer: +2 for physical assistance Toilet Transfer Details (indicate cue type and reason): Pt transferred to [facility] with +2 assist and walker.  Clarise Cruz Plus was not used on this occasion which is progress for this pt. Toileting- Clothing Manipulation and Hygiene: +2 for physical assistance;Sit to/from stand Toileting - Clothing Manipulation Details (indicate cue type and reason): Pt transferred to standing with +2 assist and walker.  pt remained in standing for 4 minutes w/o rest with min guard while he was cleaned.  Pt unable to let go of walker to clean self.      Functional mobility during ADLs: +2 for physical assistance General ADL Comments: Pt excited about being in chair and getting there w the assist of 2 and no use of the  lift.  Pt was able to pivot with +2 mod assist and a walker.  Pt able to complete adls easier from chair.      Vision                     Perception     Praxis      Cognition   Behavior During Therapy: WFL for tasks assessed/performed Overall Cognitive Status: Within Functional Limits for tasks assessed                        Extremity/Trunk Assessment               Exercises     Shoulder Instructions       General Comments      Pertinent Vitals/ Pain       Pain Assessment: No/denies pain  Home Living                                          Prior Functioning/Environment              Frequency Min 2X/week     Progress Toward Goals  OT Goals(current goals can now be found in the care plan section)  Progress towards OT goals: Progressing toward goals  Acute Rehab OT Goals Patient Stated Goal: to walk without walker OT Goal Formulation: With patient Time For Goal Achievement: 12/17/13 Potential to Achieve Goals: Good ADL Goals Pt Will Perform Lower Body Bathing: sitting/lateral leans;bed level;with min assist Pt Will Perform Lower Body Dressing: sitting/lateral leans;bed level;with min assist Pt Will Transfer to Toilet: with mod assist;stand pivot transfer;bedside commode Pt Will Perform Toileting - Clothing Manipulation and hygiene: with min assist;sitting/lateral leans;bed level  Plan Discharge plan remains appropriate    Co-evaluation                 End of Session Equipment Utilized During Treatment: Rolling walker   Activity Tolerance Patient tolerated treatment well   Patient Left in chair;with call bell/phone within reach   Nurse Communication Mobility status        Time: 9450-3888 OT Time Calculation (min): 30 min  Charges: OT General Charges $OT Visit: 1 Procedure OT Treatments $Self Care/Home Management : 23-37 mins  Glenford Peers 12/05/2013, 12:00 PM  [phone]

## 2013-12-05 NOTE — Progress Notes (Signed)
Pt wants to be placed on the vent to rest around 11:00 tonight. RT will continue to monitor.

## 2013-12-05 NOTE — Clinical Social Work Placement (Signed)
  [facility] [phone]  faxed referral on 11/20  [facility] [place], [place] [phone] Faxed referral on 11/20  Patient refused to be faxed to facilities outside the vicinity of [place] at this time.  CSW will continue to follow.  Domenica Reamer, [place] Social Worker [phone]

## 2013-12-05 NOTE — Progress Notes (Signed)
   Name: Travis Chapman MRN: [id number] DOB: [birth year]    ADMISSION DATE:  12/02/2013 CONSULTATION DATE:  12/02/2013  REFERRING MD :  TRH  CHIEF COMPLAINT:  Mechanical fall, chronic respiratory failure s/p tracheostomy  BRIEF PATIENT DESCRIPTION: 68 y.o. M with chronic trach (diurnal ATC, noctural vent) for chronic resp failure due to OSA/OHS, brought to [facility] ED 11/17 after he suffered a few mechanical falls while at home.  Fully asymptomatic; however, admitted overnight by [facility] for observation.  PCCM consulted for vent management.  SIGNIFICANT EVENTS  11/17 - admitted  STUDIES:  CT head 11/17 >>> negative  SUBJECTIVE: No events overnight.  VITAL SIGNS: Temp:  [97.5 F (36.4 C)-98.7 F (37.1 C)] 98.6 F (37 C) (11/20 0730) Pulse Rate:  [60-71] 69 (11/20 0839) Resp:  [14-27] 20 (11/20 0839) BP: (117-160)/(51-78) 117/65 mmHg (11/20 0423) SpO2:  [94 %-100 %] 97 % (11/20 0839) FiO2 (%):  [40 %] 40 % (11/20 0423) Weight:  [158.396 kg (349 lb 3.2 oz)] 158.396 kg (349 lb 3.2 oz) (11/20 0344)  PHYSICAL EXAMINATION: General: Morbidly obese male, resting in bed, in NAD. Neuro: A&O x 3, non-focal.  HEENT: [place]/AT. PERRL, sclerae anicteric.  Has chronic tracheostomy. Cardiovascular: RRR, no M/R/G appreciated. Lungs: Respirations even and unlabored.  CTA bilaterally, No W/R/R.  Chronic trach on ATC currently. Abdomen: Obese, BS x 4, soft, NT/ND.  Musculoskeletal: Chronic venous stasis changes bilaterally, no edema.  Skin: Intact, warm, no rashes.  Recent Labs Lab 12/02/13 1707 12/03/13 0613 12/04/13 0349  NA 138 139 138  K 4.9 4.4 4.0  CL 102 103 102  CO2 27 25 22   BUN 21 19 18   CREATININE 1.54* 1.52* 1.54*  GLUCOSE 183* 149* 146*    Recent Labs Lab 12/02/13 1707 12/03/13 0613 12/04/13 0349  HGB 11.6* 10.5* 10.7*  HCT 35.4* 32.8* 33.2*  WBC 7.3 5.9 6.0  PLT 192 187 178   No results found.  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 Chronic respiratory failure due to OSA / OSH Hx PE - on coumadin, INR subtherapeutic on admission Recs: Continue nocturnal vent support (per last discharge summary, prior settings were PRVC, Rate 14, VT 600, PEEP 5). Continue diurnal ATC. Pulmonary hygiene. Continue coumadin per pharmacy. PRN bronchodilators. Titrate O2 down as able for sat of 88-92%.  Generalized deconditioning Mechanical fall Recs: PT / OT consult in AM.  Will see again on Monday.  All other problems, per primary team.  Rush Farmer, M.D. [facility] Pulmonary/Critical Care Medicine. Pager: [phone]. After hours pager: [phone].

## 2013-12-05 NOTE — Progress Notes (Signed)
[place] TEAM 1 - Stepdown/ICU TEAM Progress Note  Travis Chapman CWC:[id number] DOB: 03/12/1945 DOA: 12/02/2013 PCP: Renato Shin, MD  Admit HPI / Brief Narrative: 68 y.o. 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 On previous discharge patient was to be placed in SNF.   Patient has complex hx of obesity hypoventilation syndrome with chronic respiratory failure tracheostomy dependent and ventilator dependent at night.   The patient presented with 3 falls in the last 3 days prior to admission.  He was recently hospitalized in September with similar situation and at that point was recommended to be discharged at [facility] but he decided to go home instead and works with home health and home physical therapy.  However, with working with these teams, he has had no significant improvement.   Last week he started having recurrent fall in the fall he describes are due to generalized weakness of bilateral lower legs. Patient denies any focal deficit head injury and neck injury, chest pain or tachycardia, also denies any complaint of fever, chills, nausea, vomiting, abdominal pain, diarrhea, burning urination.  Patient mentions he is taking all his medications regularly and there is no recent change in his medication.  The patient is coming from home And at his baseline independent for most of his ADL.  PT and OT have recommended SNF placement due to generalized weakness  HPI/Subjective: Patient sitting on side of bed eating breakfast.  Feels back to baseline.  Understands he needs to go to SNF at discharge.   Assessment/Plan: Generalized weakness / deconditioned state - Recurring problem, agrees to SNF facility again today, CM informed    CKD stage III  - baseline creatinine 1.3-2  - 11/19 Cr = 1. 54, at baseline   Chronic diastolic heart failure  - EF 50-55% via TTE April 2015, w/ grade 1 diastolic dysfunction - Admission weight 158.5 kg, with a dry weight of weight ~160.6 kg  - strict in and out - Daily a.m. weight  Morbid obesity - Body mass index is 43.52 kg/(m^2).   Coagulopathy (Hx PE) -  INR 2.22 today, at goal.  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  He has not been on antibiotics and has not become septic, so likely contaminant.  Will continue to monitor for finalization of BC   Code Status: DNR Family Communication: no family present at time of exam Disposition Plan: Discussed with CM about SNF placement    Consultants: PCCM for vent management  Procedure/Significant Events:  Culture MRSA nares negative  Antibiotics: NA  DVT  prophylaxis: coumadin    LINES / TUBES:  PIV  Continuous Infusions:   Objective: VITAL SIGNS: Temp: 98.6 F (37 C) (11/20 0730) Temp Source: Oral (11/20 0730) BP: 117/65 mmHg (11/20 0423) Pulse Rate: 69 (11/20 0839) SPO2; FIO2:   Intake/Output Summary (Last 24 hours) at 12/05/13 1209 Last data filed at 12/05/13 0730  Gross per 24 hour  Intake     50 ml  Output   1425 ml  Net  -1375 ml      Exam: General: No acute respiratory distress, trach in place with clear secretion Lungs: Clear to auscultation bilaterally without wheezes or crackles Cardiovascular: Regular rate and rhythm without murmur or gallop Abdomen: Obese, Nontender, nondistended, soft, bowel sounds positive Extremities: Healing wounds to bilateral shins, no cyanosis  Data Reviewed: Basic Metabolic Panel:  Recent Labs Lab 12/02/13 1707 12/02/13 2125 12/03/13 0613 12/04/13 0349  NA 138  --  139 138  K 4.9  --  4.4 4.0  CL 102  --  103 102  CO2 27  --  25 22  GLUCOSE 183*  --  149* 146*  BUN 21  --  19 18  CREATININE 1.54*  --  1.52* 1.54*  CALCIUM 9.3  --  8.8 8.6  MG  --  2.3  --   --   PHOS  --  3.4  --  3.2   Liver Function Tests:  Recent Labs Lab 12/02/13 1707 12/03/13 0613 12/04/13 0349  AST 22 20  --   ALT 11 9  --   ALKPHOS 70 59  --   BILITOT 0.5 0.4  --   PROT 7.3 6.3  --   ALBUMIN 3.1* 2.8* 2.6*    Recent Labs Lab 12/02/13 1707  LIPASE 16    Recent Labs Lab 12/02/13 1707  AMMONIA 31   CBC:  Recent Labs Lab 12/02/13 1707 12/03/13 0613 12/04/13 0349  WBC 7.3 5.9 6.0  NEUTROABS 6.0 4.4  --   HGB 11.6* 10.5* 10.7*  HCT 35.4* 32.8* 33.2*  MCV 85.1 85.4 84.9  PLT 192 187 178   Cardiac Enzymes:  Recent Labs Lab 12/02/13 1707 12/02/13 2125  CKTOTAL  --  370*  TROPONINI <0.30  --    BNP (last 3 results)  Recent Labs  08/03/13 1600 09/24/13 1701 12/03/13 0121  PROBNP 844.9* 7.4 1519.0*   CBG:  Recent Labs Lab 12/03/13 2139 12/04/13 0727 12/04/13 1716 12/04/13 2124 12/05/13 0811  GLUCAP 132* 131* 141* 139* 111*    Recent Results (from the past 240 hour(s))  Urine culture     Status: None   Collection Time: 12/02/13  5:01 PM  Result Value Ref Range Status   Specimen Description URINE, CATHETERIZED  Final   Special Requests NONE  Final   Culture  Setup Time   Final    12/02/2013 20:00 Performed at [place] Performed at [facility]   Final   Culture NO GROWTH Performed at [facility]   Final   Report Status 12/03/2013 FINAL  Final  Culture, blood (routine x 2)     Status: None (Preliminary result)   Collection Time: 12/02/13  5:07 PM  Result Value Ref Range Status   Specimen Description BLOOD RIGHT FOREARM  Final   Special Requests BOTTLES DRAWN AEROBIC AND ANAEROBIC 10CC  Final   Culture  Setup Time   Final    12/03/2013 00:26 Performed at [facility]  Culture   Final    STAPHYLOCOCCUS SPECIES Note: Gram Stain Report Called to,Read Back By and Verified With: MICHELLE MCMILLAN ON 12/03/2013 AT 9:22P BY WILEJ Performed at [facility]    Report Status PENDING  Incomplete  Culture, blood (routine x 2)     Status: None (Preliminary result)   Collection Time: 12/02/13  6:11 PM  Result Value Ref Range Status   Specimen Description BLOOD LEFT HAND  Final   Special Requests   Final    BOTTLES DRAWN AEROBIC AND ANAEROBIC 5CC AER,2CC ANA   Culture  Setup Time   Final    12/03/2013 00:47 Performed at [facility]    Culture   Final           BLOOD CULTURE RECEIVED NO GROWTH TO DATE CULTURE WILL BE HELD FOR 5 DAYS BEFORE ISSUING A FINAL NEGATIVE REPORT Performed at [facility]    Report Status PENDING  Incomplete  Culture, respiratory (NON-Expectorated)     Status: None   Collection Time: 12/02/13  9:15 PM  Result Value Ref Range Status   Specimen Description SPUTUM  Final   Special Requests NONE  Final   Gram Stain   Final    MODERATE WBC PRESENT, PREDOMINANTLY PMN MODERATE SQUAMOUS EPITHELIAL CELLS PRESENT FEW GRAM POSITIVE COCCI IN PAIRS IN CHAINS FEW GRAM POSITIVE RODS FEW GRAM NEGATIVE COCCI    Culture   Final    NORMAL OROPHARYNGEAL FLORA Performed at [facility]    Report Status 12/05/2013 FINAL  Final  MRSA PCR Screening     Status: None   Collection Time: 12/03/13 12:16 AM  Result Value  Ref Range Status   MRSA by PCR NEGATIVE NEGATIVE Final    Comment:        The GeneXpert MRSA Assay (FDA approved for NASAL specimens only), is one component of a comprehensive MRSA colonization surveillance program. It is not intended to diagnose MRSA infection nor to guide or monitor treatment for MRSA infections.      Studies:  Recent x-ray studies have been reviewed in detail by the Attending Physician  Scheduled Meds:  Scheduled Meds: . amLODipine  10 mg Oral Daily  . antiseptic oral rinse  7 mL Mouth Rinse QID  . carvedilol  6.25 mg Oral BID WC  . chlorhexidine  15 mL Mouth Rinse BID  . citalopram  20 mg Oral Daily  . cloNIDine  0.1 mg Oral BID  . docusate sodium  100 mg Oral BID  . furosemide  60 mg Oral Daily  . insulin aspart  0-15 Units Subcutaneous TID WC  . insulin aspart  0-5 Units Subcutaneous QHS  . insulin glargine  28 Units Subcutaneous q morning - 10a  . losartan  50 mg Oral Daily  . metoCLOPramide  5 mg Oral TID  . polyethylene glycol  17 g Oral Daily  . potassium chloride SA  40 mEq Oral Daily  . senna-docusate  1 tablet Oral BID  . simvastatin  20 mg Oral q1800  . sodium chloride  3 mL Intravenous Q12H  . Warfarin - Pharmacist Dosing Inpatient   Does not apply q1800    Time spent on care of this patient: 40 mins   Gilles [place] , MD   [facility] Office  [phone] Pager - [phone]  On-Call/Text Page:      [email or web address]      password [email or web address]  If 7PM-7AM, please contact night-coverage [email or web address] Password [email or web address] 12/05/2013, 12:09 PM  LOS: 3 days

## 2013-12-06 LAB — COMPREHENSIVE METABOLIC PANEL
ALBUMIN: 2.6 g/dL — AB (ref 3.5–5.2)
ALK PHOS: 60 U/L (ref 39–117)
ALT: 9 U/L (ref 0–53)
AST: 12 U/L (ref 0–37)
Anion gap: 11 (ref 5–15)
BUN: 20 mg/dL (ref 6–23)
CHLORIDE: 99 meq/L (ref 96–112)
CO2: 26 meq/L (ref 19–32)
Calcium: 8.8 mg/dL (ref 8.4–10.5)
Creatinine, Ser: 1.58 mg/dL — ABNORMAL HIGH (ref 0.50–1.35)
GFR calc Af Amer: 51 mL/min — ABNORMAL LOW (ref >90)
GFR calc non Af Amer: 44 mL/min — ABNORMAL LOW (ref >90)
Glucose, Bld: 162 mg/dL — ABNORMAL HIGH (ref 70–99)
POTASSIUM: 4 meq/L (ref 3.7–5.3)
Sodium: 136 mEq/L — ABNORMAL LOW (ref 137–147)
Total Protein: 6.3 g/dL (ref 6.0–8.3)

## 2013-12-06 LAB — CULTURE, BLOOD (ROUTINE X 2)

## 2013-12-06 LAB — PROTIME-INR
INR: 1.95 — AB (ref 0.00–1.49)
PROTHROMBIN TIME: 22.4 s — AB (ref 11.6–15.2)

## 2013-12-06 LAB — GLUCOSE, CAPILLARY
Glucose-Capillary: 138 mg/dL — ABNORMAL HIGH (ref 70–99)
Glucose-Capillary: 195 mg/dL — ABNORMAL HIGH (ref 70–99)
Glucose-Capillary: 114 mg/dL — ABNORMAL HIGH (ref 70–99)
Glucose-Capillary: 224 mg/dL — ABNORMAL HIGH (ref 70–99)

## 2013-12-06 MED ORDER — WARFARIN SODIUM 10 MG PO TABS
10.0000 mg | ORAL_TABLET | Freq: Once | ORAL | Status: AC
  Administered 2013-12-06: 10 mg via ORAL
  Filled 2013-12-06: qty 1

## 2013-12-06 NOTE — Progress Notes (Signed)
ANTICOAGULATION CONSULT NOTE - Follow Up Consult  Pharmacy Consult for Coumadin Indication: hx PE  No Known Allergies  Patient Measurements: Height: 6\' 2"  (188 cm) Weight: (!) 344 lb 11.2 oz (156.355 kg) IBW/kg (Calculated) : 82.2  Vital Signs: Temp: 97.5 F (36.4 C) (11/21 1233) Temp Source: Oral (11/21 1233) BP: 131/64 mmHg (11/21 1233) Pulse Rate: 61 (11/21 1308)  Labs:  Recent Labs  12/04/13 0349 12/05/13 0316 12/06/13 0448  HGB 10.7*  --   --   HCT 33.2*  --   --   PLT 178  --   --   LABPROT 23.7* 24.8* 22.4*  INR 2.09* 2.22* 1.95*  CREATININE 1.54*  --  1.58*    Estimated Creatinine Clearance: 71.8 mL/min (by C-G formula based on Cr of 1.58).  Assessment:   INR is just below target range today after being low-therapeutic the last 2 days.   Home regimen: 10 mg MWF, 5 mg TTSS, but INR was subtherapeutic (1.62 ) on admission 12/02/13.  Goal of Therapy:  INR 2-3 Monitor platelets by anticoagulation protocol: Yes   Plan:   Coumadin 10 mg today, instead of usual Saturday dose of 5 mg.  Continue daily PT/INR.  CBC in am.  Arty Baumgartner, RPh Pger: [phone] 12/06/2013,4:07 PM

## 2013-12-06 NOTE — Progress Notes (Signed)
Pt off ventilator at this point on room air.

## 2013-12-06 NOTE — Progress Notes (Signed)
Pt continues to remain off the vent at this time on room air. Vent on s/b

## 2013-12-06 NOTE — Progress Notes (Signed)
Pt remains on room air at this time. Vent on S/B

## 2013-12-06 NOTE — Progress Notes (Signed)
[facility] TEAM 1 - Stepdown/ICU TEAM Progress Note  Travis Chapman [id number]:[id number] DOB: 1945/09/16 DOA: 12/02/2013 PCP: Renato Shin, MD  Admit HPI / Brief Narrative: 68 y.o. 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 On previous discharge patient was to be placed in SNF.   Patient has complex hx of obesity hypoventilation syndrome with chronic respiratory failure tracheostomy dependent and ventilator dependent at night.   The patient presented with 3 falls in the last 3 days prior to admission.  He was recently hospitalized in September with similar situation and at that point was recommended to be discharged at [facility] but he decided to go home instead and works with home health and home physical therapy.  However, with working with these teams, he has had no significant improvement.    Week prior to admission he started having recurrent fall in the fall he describes are due to generalized weakness of bilateral lower legs.  PT and OT have recommended SNF placement due to generalized weakness  HPI/Subjective: Patient doing well this morning, at baseline health.  No concerns.  Pain in his knees.   Assessment/Plan: Generalized weakness / deconditioned state - Vent SNF at discharge, search ongoing - CSW and CM aware  CKD stage III  - baseline creatinine 1.3-2  - 11/21 Cr = 1. 58, at baseline  - Monitor  Chronic diastolic heart failure  - EF 50-55% via TTE April 2015, w/ grade 1 diastolic dysfunction, stable - strict in and out - Daily a.m. 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  He has not been on antibiotics and has not become septic, so likely contaminant.    Code Status: DNR Family Communication: no family present at time of exam Disposition Plan: Vent SNF at discharge    Consultants: PCCM for vent management PT/OT CSW  Culture MRSA nares negative  Antibiotics: NA  DVT prophylaxis: coumadin    LINES / TUBES:  PIV  Continuous Infusions:   Objective: VITAL SIGNS: Temp: 98.1 F (36.7 C) (11/21 0826) Temp Source: Oral (11/21 0826) BP: 146/74 mmHg (11/21 0826) Pulse Rate: 64 (11/21 0727) SPO2; FIO2:   Intake/Output Summary (Last 24 hours) at 12/06/13 1042 Last data filed at 12/06/13 0830  Gross per 24 hour  Intake    480 ml  Output   1855 ml  Net  -1375 ml     Exam: General: No acute respiratory distress, trach in place with clear secretions Lungs: CTAB,  breathing comfortably, reviewed RT notes, chronic trach in place Cardiovascular: RR, NR, no murmur Abdomen: Obese, Nontender, nondistended, soft, bowel sounds positive Extremities: Healing wounds to bilateral shins, no cyanosis  Data Reviewed: Basic Metabolic Panel:  Recent Labs Lab 12/02/13 1707 12/02/13 2125 12/03/13 0613 12/04/13 0349 12/06/13 0448  NA 138  --  139 138 136*  K 4.9  --  4.4 4.0 4.0  CL 102  --  103 102 99  CO2 27  --  25 22 26   GLUCOSE 183*  --  149*  146* 162*  BUN 21  --  19 18 20   CREATININE 1.54*  --  1.52* 1.54* 1.58*  CALCIUM 9.3  --  8.8 8.6 8.8  MG  --  2.3  --   --   --   PHOS  --  3.4  --  3.2  --    Liver Function Tests:  Recent Labs Lab 12/02/13 1707 12/03/13 0613 12/04/13 0349 12/06/13 0448  AST 22 20  --  12  ALT 11 9  --  9  ALKPHOS 70 59  --  60  BILITOT 0.5 0.4  --  <0.2*  PROT 7.3 6.3  --  6.3  ALBUMIN 3.1* 2.8* 2.6* 2.6*    Recent Labs Lab 12/02/13 1707  LIPASE 16    Recent Labs Lab 12/02/13 1707  AMMONIA 31   CBC:  Recent Labs Lab 12/02/13 1707 12/03/13 0613 12/04/13 0349  WBC 7.3 5.9 6.0  NEUTROABS 6.0 4.4  --   HGB 11.6* 10.5* 10.7*  HCT 35.4* 32.8* 33.2*  MCV 85.1 85.4 84.9  PLT 192 187 178   Cardiac Enzymes:  Recent Labs Lab 12/02/13 1707 12/02/13 2125  CKTOTAL  --  370*  TROPONINI <0.30  --    BNP (last 3 results)  Recent Labs  08/03/13 1600 09/24/13 1701 12/03/13 0121  PROBNP 844.9* 7.4 1519.0*   CBG:  Recent Labs Lab 12/05/13 0811 12/05/13 1152 12/05/13 1609 12/05/13 2139 12/06/13 0828  GLUCAP 111* 176* 161* 157* 138*    Recent Results (from the past 240 hour(s))  Urine culture     Status: None   Collection Time: 12/02/13  5:01 PM  Result Value Ref Range Status   Specimen Description URINE, CATHETERIZED  Final   Special Requests NONE  Final   Culture  Setup Time   Final    12/02/2013 20:00 Performed at [place] Performed at [facility]   Final   Culture NO GROWTH Performed at [facility]   Final   Report Status 12/03/2013 FINAL  Final  Culture, blood (routine x 2)     Status: None   Collection Time: 12/02/13  5:07 PM  Result Value Ref Range Status   Specimen Description BLOOD RIGHT FOREARM  Final   Special Requests BOTTLES DRAWN AEROBIC AND ANAEROBIC 10CC  Final   Culture  Setup Time   Final    12/03/2013 00:26 Performed at [facility]    Culture   Final     STAPHYLOCOCCUS SPECIES (COAGULASE NEGATIVE) Note: THE SIGNIFICANCE OF ISOLATING THIS ORGANISM FROM A SINGLE SET OF BLOOD CULTURES WHEN MULTIPLE SETS ARE DRAWN IS UNCERTAIN. PLEASE NOTIFY THE MICROBIOLOGY DEPARTMENT WITHIN ONE WEEK IF SPECIATION AND SENSITIVITIES ARE REQUIRED. Note: Gram Stain Report Called to,Read Back By and Verified With: MICHELLE MCMILLAN ON 12/03/2013 AT 9:22P BY Dennard Nip Performed at [facility]    Report Status 12/06/2013 FINAL  Final  Culture, blood (routine x 2)     Status: None (Preliminary result)   Collection Time: 12/02/13  6:11 PM  Result Value Ref Range Status   Specimen Description BLOOD LEFT HAND  Final   Special Requests   Final    BOTTLES DRAWN AEROBIC AND ANAEROBIC 5CC AER,2CC ANA   Culture  Setup Time   Final    12/03/2013 00:47 Performed at [facility]    Culture   Final           BLOOD CULTURE RECEIVED NO GROWTH TO DATE CULTURE WILL BE HELD FOR 5 DAYS BEFORE ISSUING A FINAL NEGATIVE REPORT Performed at [facility]    Report Status PENDING  Incomplete  Culture, respiratory (NON-Expectorated)     Status: None   Collection Time: 12/02/13  9:15 PM  Result Value Ref Range Status   Specimen Description SPUTUM  Final   Special Requests NONE  Final   Gram Stain   Final    MODERATE WBC PRESENT, PREDOMINANTLY PMN MODERATE SQUAMOUS EPITHELIAL CELLS PRESENT FEW GRAM POSITIVE COCCI IN PAIRS IN CHAINS FEW GRAM POSITIVE RODS FEW GRAM NEGATIVE COCCI    Culture   Final    NORMAL OROPHARYNGEAL FLORA Performed at [facility]    Report Status 12/05/2013 FINAL  Final  MRSA PCR Screening     Status: None   Collection Time: 12/03/13 12:16 AM  Result Value Ref Range Status   MRSA by PCR NEGATIVE NEGATIVE Final    Comment:        The GeneXpert MRSA Assay (FDA approved for NASAL specimens only), is one component of a comprehensive MRSA colonization surveillance program. It is not intended to diagnose MRSA infection nor to  guide or monitor treatment for MRSA infections.      Studies:  Recent x-ray studies have been reviewed in detail by the Attending Physician  Scheduled Meds:  Scheduled Meds: . amLODipine  10 mg Oral Daily  . antiseptic oral rinse  7 mL Mouth Rinse QID  . carvedilol  6.25 mg Oral BID WC  . chlorhexidine  15 mL Mouth Rinse BID  . citalopram  20 mg Oral Daily  . cloNIDine  0.1 mg Oral BID  . docusate sodium  100 mg Oral BID  . furosemide  60 mg Oral Daily  . insulin aspart  0-15 Units Subcutaneous TID WC  . insulin aspart  0-5 Units Subcutaneous QHS  . insulin glargine  28 Units Subcutaneous q morning - 10a  . losartan  50 mg Oral Daily  . metoCLOPramide  5 mg Oral TID  . polyethylene glycol  17 g Oral Daily  . potassium chloride SA  40 mEq Oral Daily  . senna-docusate  1 tablet Oral BID  . simvastatin  20 mg Oral q1800  . sodium chloride  3 mL Intravenous Q12H  . Warfarin - Pharmacist Dosing Inpatient   Does not apply q1800    Time spent on care of this patient: 35 mins   Gilles Chiquito , MD   [facility] Office  [phone] Pager - [phone]  On-Call/Text Page:      [email or web address]      password [email or web address]  If 7PM-7AM, please contact night-coverage [email or web address] Password [email or web address] 12/06/2013, 10:42 AM   LOS: 4 days

## 2013-12-06 NOTE — Progress Notes (Signed)
Pt is off the vent and on RA O2 saturation 95%. RT will continue to monitor.

## 2013-12-06 NOTE — Progress Notes (Signed)
   Name: Travis Chapman MRN: [id number] DOB: Sep 20, 1945    ADMISSION DATE:  12/02/2013 CONSULTATION DATE:  12/02/2013  REFERRING MD :  TRH  CHIEF COMPLAINT:  Mechanical fall, chronic respiratory failure s/p tracheostomy  BRIEF PATIENT DESCRIPTION: 66 yobm with morbid obesity complicated by OHS with chronic trach (diurnal ATC, noctural vent) for chronic resp failure brought to [facility] ED 11/17 after he suffered a few mechanical falls while at home.  Fully asymptomatic; however, admitted overnight by [facility] for observation.  PCCM consulted for vent management.  SIGNIFICANT EVENTS  11/17 - admitted  STUDIES:  CT head 11/17 >>> negative  SUBJECTIVE:  Awake and appropriate in am p vent overnight Limited by knees from full mobilization   VITAL SIGNS: Temp:  [97.4 F (36.3 C)-98.7 F (37.1 C)] 98.1 F (36.7 C) (11/21 0826) Pulse Rate:  [60-95] 64 (11/21 0727) Resp:  [13-24] 18 (11/21 0727) BP: (104-146)/(56-75) 146/74 mmHg (11/21 0826) SpO2:  [91 %-100 %] 97 % (11/21 0727) FiO2 (%):  [21 %-40 %] 21 % (11/21 0608) Weight:  [344 lb 11.2 oz (156.355 kg)] 344 lb 11.2 oz (156.355 kg) (11/21 0353)  PHYSICAL EXAMINATION: General: Morbidly obese male, resting in bed, in NAD. Neuro: A&O x 3, non-focal.  HEENT: [place]/AT. PERRL, sclerae anicteric.  Has chronic tracheostomy. Cardiovascular: RRR, no M/R/G appreciated. Lungs: Respirations even and unlabored.  CTA bilaterally, No W/R/R.  Chronic trach on ATC currently. Abdomen: Obese, BS x 4, soft, NT/ND.  Musculoskeletal: Chronic venous stasis changes bilaterally, no edema.  Skin: Intact, warm, no rashes.    Recent Labs Lab 12/03/13 0613 12/04/13 0349 12/06/13 0448  NA 139 138 136*  K 4.4 4.0 4.0  CL 103 102 99  CO2 25 22 26   BUN 19 18 20   CREATININE 1.52* 1.54* 1.58*  GLUCOSE 149* 146* 162*    Recent Labs Lab 12/02/13 1707 12/03/13 0613 12/04/13 0349  HGB 11.6* 10.5* 10.7*  HCT 35.4* 32.8* 33.2*  WBC 7.3 5.9 6.0  PLT 192 187 178    No results found.   ASSESSMENT / PLAN:  Chronic tracheostomy status - vent dependent nocturnally, ATC diurnally. Chronic respiratory failure due to OSA / OSH Hx PE - on coumadin, INR subtherapeutic on admission Recs: Continue nocturnal vent support (per last discharge summary, prior settings were PRVC, Rate 14, VT 600, PEEP 5). Continue diurnal ATC. Pulmonary hygiene. Continue coumadin per pharmacy. PRN bronchodilators. Titrate O2 down as able for sat of 88-92%.  Generalized deconditioning Mechanical fall Recs: PT / OT consult in AM. ? Needs SNF where he can do vent qhs as this is  His 5th admit in last 6 months   Will see again 11/23    Christinia Gully, MD Pulmonary and [place] [phone] After 5:30 PM or weekends, call [phone]

## 2013-12-07 LAB — CBC
HCT: 31.8 % — ABNORMAL LOW (ref 39.0–52.0)
Hemoglobin: 10.3 g/dL — ABNORMAL LOW (ref 13.0–17.0)
MCH: 27 pg (ref 26.0–34.0)
MCHC: 32.4 g/dL (ref 30.0–36.0)
MCV: 83.5 fL (ref 78.0–100.0)
Platelets: 188 10*3/uL (ref 150–400)
RBC: 3.81 MIL/uL — AB (ref 4.22–5.81)
RDW: 13 % (ref 11.5–15.5)
WBC: 4.3 10*3/uL (ref 4.0–10.5)

## 2013-12-07 LAB — GLUCOSE, CAPILLARY
GLUCOSE-CAPILLARY: 165 mg/dL — AB (ref 70–99)
GLUCOSE-CAPILLARY: 115 mg/dL — AB (ref 70–99)
Glucose-Capillary: 134 mg/dL — ABNORMAL HIGH (ref 70–99)
Glucose-Capillary: 133 mg/dL — ABNORMAL HIGH (ref 70–99)

## 2013-12-07 LAB — PROTIME-INR
INR: 1.83 — ABNORMAL HIGH (ref 0.00–1.49)
PROTHROMBIN TIME: 21.3 s — AB (ref 11.6–15.2)

## 2013-12-07 MED ORDER — WARFARIN SODIUM 10 MG PO TABS
10.0000 mg | ORAL_TABLET | Freq: Once | ORAL | Status: AC
  Administered 2013-12-07: 10 mg via ORAL
  Filled 2013-12-07: qty 1

## 2013-12-07 NOTE — Plan of Care (Signed)
Problem: Problem: Mobility Progression Goal: INCREASED MOBILITY OR STRENGTH Outcome: Progressing Goal: IMPROVED WEIGHT-BEARING STATUS Outcome: Progressing Goal: ABLE TO AMBULATE INDEPENDENTLY Outcome: Progressing Goal: INCREASED STRENGTH-LOWER EXTREMITY Outcome: Progressing Goal: INCREASED STRENGTH-UPPER EXTREMITY Outcome: Completed/Met Date Met:  12/07/13 Goal: NO EVIDENCE OF LIGHTHEADED/DIZZINESS Outcome: Completed/Met Date Met:  12/07/13 Goal: NO EVIDENCE OF SHORTNESS OF BREATH WITH EXERTION Outcome: Completed/Met Date Met:  12/07/13 Goal: ABLE TO TURN SELF Outcome: Completed/Met Date Met:  12/07/13 Goal: ABLE TO PERFORM ADLS Outcome: Progressing

## 2013-12-07 NOTE — Progress Notes (Signed)
Pt. States that he isn't ready to go on the vent at this time. Pt. Requested to be placed on the vent around 00:00.

## 2013-12-07 NOTE — Progress Notes (Signed)
ANTICOAGULATION CONSULT NOTE - Follow Up Consult  Pharmacy Consult for Coumadin Indication: hx PE  No Known Allergies  Patient Measurements: Height: 6\' 2"  (188 cm) Weight: (!) 344 lb 11.2 oz (156.355 kg) IBW/kg (Calculated) : 82.2  Vital Signs: Temp: 98.4 F (36.9 C) (11/22 1200) Temp Source: Oral (11/22 1200) BP: 134/77 mmHg (11/22 1034) Pulse Rate: 64 (11/22 1457)  Labs:  Recent Labs  12/05/13 0316 12/06/13 0448 12/07/13 0340  HGB  --   --  10.3*  HCT  --   --  31.8*  PLT  --   --  188  LABPROT 24.8* 22.4* 21.3*  INR 2.22* 1.95* 1.83*  CREATININE  --  1.58*  --     Estimated Creatinine Clearance: 71.8 mL/min (by C-G formula based on Cr of 1.58).  Assessment:   INR remains below target range today (1.83), despite Coumadin 10 mg dose on 11/22. INR was low-therapeutic on 11/21 and 11/22.   Home regimen: 10 mg MWF, 5 mg TTSS, but INR was subtherapeutic (1.62 ) on admission 12/02/13.  Goal of Therapy:  INR 2-3 Monitor platelets by anticoagulation protocol: Yes   Plan:   Repeat Coumadin 10 mg today, instead of usual Sunday dose of 5 mg.  Continue daily PT/INR.  Arty Baumgartner, RPh Pger: [phone] 12/07/2013,4:41 PM

## 2013-12-07 NOTE — Progress Notes (Addendum)
Patient ID: Travis Chapman, male   DOB: Jan 26, 1945, 68 y.o.   MRN: [id number] [facility] PROGRESS NOTE  Erice Ahles GQQ:[id number] DOB: 05/29/1945 DOA: 12/02/2013 PCP: Renato Shin, MD  Brief narrative:    68 y.o. 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 On previous discharge patient was to be placed in SNF. The patient presented with 3 falls in the last 3 days prior to admission.He presented with weakness. SW assisting discharge plan to SNF.   Assessment/Plan:    Principal Problem: Generalized weakness / deconditioned state / functional quadriplegia - pt has had PT evaluation which recommended SNF placement; pt with frequent recurrent falls - Vent SNF at discharge, search ongoing - SW assisting discharge plan   Active Problems: Blood cultures with coag negative species - contaminant and does not require antibiotic treatment   CKD stage III  - baseline creatinine 1.3-2  - 11/21 Cr = 1. 58, at baseline   Chronic systolic and  diastolic heart failure  - EF 50-55% on TTE April 2015, w/ grade 1 diastolic dysfunction, stable - strict in and out; negative fluid balance in past 24 hours 1.6 L - weight since admission 158.58 kg --> 156.35 kg; continue daily weight  - continue coreg 6.25 mg PO BID, lasix 60 mg daily, losartan 50 mg daily  - continue potassium supplementation, 40 meq daily  - last BNP in 1500 range on 12/04/2103 which is more than his previous values. He has good diuresis and some weight loss since admission so we will continue current PO lasix. 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  - I7T on 2/45/80 was 7.3 indicating good glycemic control - CBG's in past 24 hours: 134, 224, 114, 195, 138 - continue Lantus 20 units in am along with SSI - Reglan for gastroparesis - no complaints of neuropathy this morning   Essential hypertension - continue amlodipine 10 mg daily, Coreg 6.25 mg BID, clonidine 0.1 mg BID, Lasix 60 mg daily, losartan 50 mg daily  - BP this am 134/77  HLD  - Continue Zocor 20 mg daily   Severe knee pain bilaterally  - Chronic, no change from previous admission  - PT/OT recommends SNF   - pain controlwith oxycodone IR 10 mg PO every 4 hours PRN moderate pain   Constipation - continue senna, miralax  DVT Prophylaxis  - on AC with coumadin   Code Status: DNR/DNI Family Communication:  Family not at the bedside  Disposition Plan: to SNF once stable  IV access:   Peripheral IV  Procedures and diagnostic studies:    No results found. Medical Consultants:   PCCM Other Consultants:   Physical therapy   Social work  IAnti-Infectives:    Bebe Shaggy, MD  [facility] Pager [phone]  If 7PM-7AM, please contact night-coverage [email or web address] Password [facility] 12/07/2013, 7:39 AM  LOS: 5 days    HPI/Subjective: No acute overnight events.  Objective: Filed Vitals:   12/06/13 2333 12/06/13 2344 12/07/13 0322 12/07/13 0335  BP: 150/87 131/91 131/91 102/52  Pulse: 75 71 60 63  Temp:  98.7 F (37.1 C)  98.5 F (36.9 C)  TempSrc:  Oral  Oral  Resp: 25 16 14 14   Height:      Weight:      SpO2: 100% 99% 100% 96%    Intake/Output Summary (Last 24 hours) at 12/07/13 0739 Last data filed at 12/07/13  0600  Gross per 24 hour  Intake   1023 ml  Output   2650 ml  Net  -1627 ml    Exam:   General:  Pt is alert, (+) tracheostomy   Cardiovascular: Regular rate and rhythm, S1/S2, no murmurs  Respiratory: Clear to auscultation bilaterally, no wheezing, no crackles, no rhonchi  Abdomen: obese abdomen, bowel sounds present  Extremities: trace LE pitting edema, dry skin over LE, pulses DP and PT palpable bilaterally  Neuro: Grossly nonfocal  Data Reviewed: Basic Metabolic Panel:  Recent Labs Lab 12/02/13 1707 12/02/13 2125 12/03/13 0613 12/04/13 0349 12/06/13 0448  NA 138  --  139 138 136*  K 4.9  --  4.4 4.0 4.0  CL 102  --  103 102 99  CO2 27  --  25 22 26   GLUCOSE 183*  --  149* 146* 162*  BUN 21  --  19 18 20   CREATININE 1.54*  --  1.52* 1.54* 1.58*  CALCIUM 9.3  --  8.8 8.6 8.8  MG  --  2.3  --   --   --   PHOS  --  3.4  --  3.2  --    Liver Function Tests:  Recent Labs Lab 12/02/13 1707 12/03/13 0613 12/04/13 0349 12/06/13 0448  AST 22 20  --  12  ALT 11 9  --  9  ALKPHOS 70 59  --  60  BILITOT 0.5 0.4  --  <0.2*  PROT 7.3 6.3  --  6.3  ALBUMIN 3.1* 2.8* 2.6* 2.6*    Recent Labs Lab 12/02/13 1707  LIPASE 16    Recent Labs Lab 12/02/13 1707  AMMONIA 31   CBC:  Recent Labs Lab 12/02/13 1707 12/03/13 0613 12/04/13 0349 12/07/13 0340  WBC 7.3 5.9 6.0 4.3  NEUTROABS 6.0 4.4  --   --   HGB 11.6* 10.5* 10.7* 10.3*  HCT 35.4* 32.8* 33.2* 31.8*  MCV 85.1 85.4 84.9 83.5  PLT 192 187 178 188   Cardiac Enzymes:  Recent Labs Lab 12/02/13 1707 12/02/13 2125  CKTOTAL  --  370*  TROPONINI <0.30  --    BNP: Invalid input(s): POCBNP CBG:  Recent Labs Lab 12/05/13 2139 12/06/13 0828 12/06/13 1231 12/06/13 1624 12/06/13 2209  GLUCAP 157* 138* 195* 114* 224*    Urine culture     Status: None   Collection Time: 12/02/13  5:01 PM  Result Value Ref Range Status   Specimen Description URINE, CATHETERIZED  Final   Special Requests  NONE  Final   Culture  Setup Time   Final   Colony Count NO GROWTH  Final   Report Status 12/03/2013 FINAL  Final  Culture, blood (routine x 2)     Status: None   Collection Time: 12/02/13  5:07 PM  Result Value Ref Range Status   Specimen Description BLOOD RIGHT FOREARM  Final   Special Requests BOTTLES DRAWN  AEROBIC AND ANAEROBIC 10CC  Final   Culture  Setup Time   Final   Culture   Final    STAPHYLOCOCCUS SPECIES (COAGULASE NEGATIVE)   Report Status 12/06/2013 FINAL  Final  Culture, blood (routine x 2)     Status: None (Preliminary result)   Collection Time: 12/02/13  6:11 PM  Result Value Ref Range Status   Specimen Description BLOOD LEFT HAND  Final   Special Requests   Final    BOTTLES DRAWN AEROBIC AND ANAEROBIC 5CC AER,2CC ANA   Culture  Setup Time   Final   Culture   Final           BLOOD CULTURE RECEIVED NO GROWTH TO DATE    Report Status PENDING  Incomplete  Culture, respiratory (NON-Expectorated)     Status: None   Collection Time: 12/02/13  9:15 PM  Result Value Ref Range Status   Specimen Description SPUTUM  Final   Special Requests NONE  Final   Gram Stain   Final   Culture   Final    NORMAL OROPHARYNGEAL FLORA   Report Status 12/05/2013 FINAL  Final  MRSA PCR Screening     Status: None   Collection Time: 12/03/13 12:16 AM  Result Value Ref Range Status   MRSA by PCR NEGATIVE NEGATIVE Final    Comment:           Scheduled Meds: . amLODipine  10 mg Oral Daily  . carvedilol  6.25 mg Oral BID WC  . citalopram  20 mg Oral Daily  . cloNIDine  0.1 mg Oral BID  . docusate sodium  100 mg Oral BID  . furosemide  60 mg Oral Daily  . insulin aspart  0-15 Units Subcutaneous TID WC  . insulin aspart  0-5 Units Subcutaneous QHS  . insulin glargine  28 Units Subcutaneous q morning - 10a  . losartan  50 mg Oral Daily  . metoCLOPramide  5 mg Oral TID  . polyethylene glycol  17 g Oral Daily  . potassium chloride SA  40 mEq Oral Daily  . senna-docusate  1 tablet Oral  BID  . simvastatin  20 mg Oral q1800  . Warfarin -   Does not apply q1800

## 2013-12-08 LAB — GLUCOSE, CAPILLARY
GLUCOSE-CAPILLARY: 114 mg/dL — AB (ref 70–99)
GLUCOSE-CAPILLARY: 135 mg/dL — AB (ref 70–99)
Glucose-Capillary: 141 mg/dL — ABNORMAL HIGH (ref 70–99)
Glucose-Capillary: 164 mg/dL — ABNORMAL HIGH (ref 70–99)

## 2013-12-08 LAB — PROTIME-INR
INR: 1.83 — ABNORMAL HIGH (ref 0.00–1.49)
Prothrombin Time: 21.4 seconds — ABNORMAL HIGH (ref 11.6–15.2)

## 2013-12-08 MED ORDER — WARFARIN SODIUM 2.5 MG PO TABS
12.5000 mg | ORAL_TABLET | Freq: Once | ORAL | Status: AC
  Administered 2013-12-08: 12.5 mg via ORAL
  Filled 2013-12-08: qty 1

## 2013-12-08 NOTE — Progress Notes (Signed)
Physical Therapy Treatment Patient Details Name: Travis Chapman MRN: [id number] DOB: 07-12-1945 Today's Date: 12/08/2013    History of Present Illness Pt adm with weakness, deconditioning and incr falls. PMHx-Long term trach - on vent at night. Morbid obesity, DM, CHF, severe arthritis of knees with frequent falls and 7 admissions since 2/15 for falls    PT Comments    Patient agreeable and motivated for ambulation, tolerated weight bearing for several moments then began to ambulate with min assist using RW.  Tolerated 6 steps prior to BLEs becoming too painful (knees) and unable to proceed further. Will continue to see and progress as tolerated.   Follow Up Recommendations  SNF     Equipment Recommendations  None recommended by PT    Recommendations for Other Services       Precautions / Restrictions Precautions Precautions: Fall Restrictions Weight Bearing Restrictions: No    Mobility  Bed Mobility               General bed mobility comments: patient up in chair upon arrival  Transfers Overall transfer level: Needs assistance Equipment used: Rolling walker (2 wheeled);2 person hand held assist Transfers: Sit to/from Stand Sit to Stand: +2 physical assistance;Mod assist         General transfer comment: assist for stability and elevation to upright from low surface  Ambulation/Gait Ambulation/Gait assistance: Min assist;+2 physical assistance Ambulation Distance (Feet): 6 Feet Assistive device: Rolling walker (2 wheeled) Gait Pattern/deviations: Step-to pattern;Wide base of support;Trunk flexed;Antalgic Gait velocity: decreased Gait velocity interpretation: Below normal speed for age/gender General Gait Details: knees became too painful and patient could not tolerated any further moblity at this time, assisted back to chair   Stairs            Wheelchair Mobility    Modified Rankin (Stroke Patients Only)       Balance     Sitting  balance-Leahy Scale: Fair       Standing balance-Leahy Scale: Poor                      Cognition Arousal/Alertness: Awake/alert Behavior During Therapy: WFL for tasks assessed/performed Overall Cognitive Status: Within Functional Limits for tasks assessed                      Exercises      General Comments        Pertinent Vitals/Pain Pain Assessment: 0-10 Pain Score: 9  Pain Location: bilateral knees Pain Descriptors / Indicators: Guarding;Grimacing;Aching Pain Intervention(s): Limited activity within patient's tolerance;Monitored during session;Repositioned    Home Living                      Prior Function            PT Goals (current goals can now be found in the care plan section) Acute Rehab PT Goals Patient Stated Goal: to walk without walker PT Goal Formulation: With patient Time For Goal Achievement: 12/17/13 Potential to Achieve Goals: Fair Progress towards PT goals: Progressing toward goals    Frequency  Min 2X/week    PT Plan Current plan remains appropriate    Co-evaluation             End of Session Equipment Utilized During Treatment: Gait belt Activity Tolerance: Patient limited by fatigue Patient left: in chair;with call bell/phone within reach     Time: 0254-2706 PT Time Calculation (min) (ACUTE ONLY): 14 min  Charges:  $  Therapeutic Activity: 8-22 mins                    G CodesDuncan Dull 01/05/14, 3:20 PM Alben Deeds, Festus DPT  [phone]

## 2013-12-08 NOTE — Progress Notes (Signed)
[facility] TEAM 1 - Stepdown/ICU TEAM  Travis Chapman KJI:[id number] DOB: 25-Jun-1945 DOA: 12/02/2013 PCP: Renato Shin, MD  Brief narrative:   68 y.o. male with history of obesity hypoventilation syndrome status post tracheostomy on nightly vent, diabetes mellitus with diabetic neuropathy, chronic diastolic heart failure, hypertension, physical deconditioning, and chronic anemia who presented with 3 falls in 3 days. He has been living at home with his daughter.   He was hospitalized in September with similar complaints and at that point was recommended to be discharged to an LTAC but he decided to go home instead and work with home health and home physical therapy. He has been doing so throughout the month of October without any significant improvement. He then started having recurrent falls due to generalized weakness of both legs.  Assessment/Plan:    Generalized weakness / deconditioned state / functional quadriplegia - pt has had PT evaluation which recommended SNF placement; pt with frequent recurrent falls - Vent SNF at discharge, search ongoing but challenging due to insurance coverage issues  - SW assisting   Blood cultures 1 of 2 with coag negative species - contaminant and does not require antibiotic treatment   CKD stage III  - baseline creatinine 1.3-2  - crt is stable at baseline  Chronic systolic and  diastolic heart failure  - EF 50-55% on TTE April 2015, w/ grade 1 diastolic dysfunction - strict in and out; negative fluid balance in past 24 hours 1.6 L - weight since admission 158.58 kg --> 156.6 kg; continue daily weight  - continue coreg, lasix, losartan  History of pulmonary embolism - on anticoagulation with coumadin - continue coumadin dosing per pharmacy  - no reports of bleeding; hemoglobin and platelet count stable   Chronic hypercarbic resp failure / OHS / Sleep apnea s/p trach / chronic nocturnal vent support  - Vent support per PCCM, overnight  only - ATC during day  - respiratory status is stable   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  - L7J on 7/36/68 was 7.3 - continue Lantus along with SSI - Reglan for gastroparesis  Essential hypertension - Bp reasonably well controlled at this time - no change in tx plan today   HLD  - Continue Zocor  Severe knee pain bilaterally  - Chronic, no change from previous admission  - PT/OT recommends SNF   - pain controlwith oxycodone IR 10 mg PO every 4 hours PRN moderate pain   Constipation - continue senna, miralax  DVT Prophylaxis  coumadin   Code Status: DNR/DNI Family Communication:  Family not at the bedside  Disposition Plan: to SNF when bed available    Procedures and diagnostic studies:    none  Medical Consultants:  PCCM  Anti-Infectives:   none  HPI/Subjective: No new complaints today.  About to work w/ PT.  Denies cp or sob at rest.    Objective: Filed Vitals:   12/08/13 0800 12/08/13 0921 12/08/13 1214 12/08/13 1217  BP: 133/68 109/89 98/59 98/59   Pulse: 60  58 60  Temp:   98 F (36.7 C)   TempSrc:   Oral   Resp: 17  17 18   Height:      Weight:      SpO2: 95%  96% 96%    Intake/Output Summary (Last 24 hours) at 12/08/13 1443 Last data filed at 12/08/13 1415  Gross per 24 hour  Intake    723 ml  Output   2045 ml  Net  -1322 ml  Exam: General: No acute respiratory distress Lungs: Clear to auscultation bilaterally without wheezes or crackles - distant bs due to obesity  Cardiovascular: Regular rate and rhythm without murmur gallop or rub normal S1 and S2 - distant HS  Abdomen: Nontender, nondistended, soft, bowel sounds positive, no rebound, no ascites, no appreciable mass Extremities: No significant cyanosis, clubbing;  1+ edema bilateral lower extremities  Data Reviewed: Basic Metabolic Panel:  Recent Labs Lab 12/02/13 1707 12/02/13 2125 12/03/13 0613 12/04/13 0349 12/06/13 0448  NA 138  --  139 138 136*  K  4.9  --  4.4 4.0 4.0  CL 102  --  103 102 99  CO2 27  --  25 22 26   GLUCOSE 183*  --  149* 146* 162*  BUN 21  --  19 18 20   CREATININE 1.54*  --  1.52* 1.54* 1.58*  CALCIUM 9.3  --  8.8 8.6 8.8  MG  --  2.3  --   --   --   PHOS  --  3.4  --  3.2  --    Liver Function Tests:  Recent Labs Lab 12/02/13 1707 12/03/13 0613 12/04/13 0349 12/06/13 0448  AST 22 20  --  12  ALT 11 9  --  9  ALKPHOS 70 59  --  60  BILITOT 0.5 0.4  --  <0.2*  PROT 7.3 6.3  --  6.3  ALBUMIN 3.1* 2.8* 2.6* 2.6*    Recent Labs Lab 12/02/13 1707  LIPASE 16    Recent Labs Lab 12/02/13 1707  AMMONIA 31   CBC:  Recent Labs Lab 12/02/13 1707 12/03/13 0613 12/04/13 0349 12/07/13 0340  WBC 7.3 5.9 6.0 4.3  NEUTROABS 6.0 4.4  --   --   HGB 11.6* 10.5* 10.7* 10.3*  HCT 35.4* 32.8* 33.2* 31.8*  MCV 85.1 85.4 84.9 83.5  PLT 192 187 178 188   Cardiac Enzymes:  Recent Labs Lab 12/02/13 1707 12/02/13 2125  CKTOTAL  --  370*  TROPONINI <0.30  --    CBG:  Recent Labs Lab 12/07/13 1151 12/07/13 1601 12/07/13 2246 12/08/13 0750 12/08/13 1211  GLUCAP 165* 133* 115* 114* 141*    Urine culture     Status: None   Collection Time: 12/02/13  5:01 PM  Result Value Ref Range Status   Specimen Description URINE, CATHETERIZED  Final   Special Requests NONE  Final   Culture  Setup Time   Final   Colony Count NO GROWTH  Final   Report Status 12/03/2013 FINAL  Final  Culture, blood (routine x 2)     Status: None   Collection Time: 12/02/13  5:07 PM  Result Value Ref Range Status   Specimen Description BLOOD RIGHT FOREARM  Final   Special Requests BOTTLES DRAWN AEROBIC AND ANAEROBIC 10CC  Final   Culture  Setup Time   Final   Culture   Final    STAPHYLOCOCCUS SPECIES (COAGULASE NEGATIVE)   Report Status 12/06/2013 FINAL  Final  Culture, blood (routine x 2)     Status: None (Preliminary result)   Collection Time: 12/02/13  6:11 PM  Result Value Ref Range Status   Specimen Description  BLOOD LEFT HAND  Final   Special Requests   Final    BOTTLES DRAWN AEROBIC AND ANAEROBIC 5CC AER,2CC ANA   Culture  Setup Time   Final   Culture   Final           BLOOD CULTURE RECEIVED  NO GROWTH TO DATE    Report Status PENDING  Incomplete  Culture, respiratory (NON-Expectorated)     Status: None   Collection Time: 12/02/13  9:15 PM  Result Value Ref Range Status   Specimen Description SPUTUM  Final   Special Requests NONE  Final   Gram Stain   Final   Culture   Final    NORMAL OROPHARYNGEAL FLORA   Report Status 12/05/2013 FINAL  Final  MRSA PCR Screening     Status: None   Collection Time: 12/03/13 12:16 AM  Result Value Ref Range Status   MRSA by PCR NEGATIVE NEGATIVE Final    Comment:           Scheduled Meds: . amLODipine  10 mg Oral Daily  . carvedilol  6.25 mg Oral BID WC  . citalopram  20 mg Oral Daily  . cloNIDine  0.1 mg Oral BID  . docusate sodium  100 mg Oral BID  . furosemide  60 mg Oral Daily  . insulin aspart  0-15 Units Subcutaneous TID WC  . insulin aspart  0-5 Units Subcutaneous QHS  . insulin glargine  28 Units Subcutaneous q morning - 10a  . losartan  50 mg Oral Daily  . metoCLOPramide  5 mg Oral TID  . polyethylene glycol  17 g Oral Daily  . potassium chloride SA  40 mEq Oral Daily  . senna-docusate  1 tablet Oral BID  . simvastatin  20 mg Oral q1800  . Warfarin -   Does not apply N1833   Cherene Altes, MD [facility] For Consults/Admissions - Flow Manager - [phone] Office  [phone] Pager [phone]  On-Call/Text Page:      [email or web address]      password [facility]  12/08/2013 2:50 PM

## 2013-12-08 NOTE — Plan of Care (Signed)
Problem: Problem: Mobility Progression Goal: INCREASED MOBILITY OR STRENGTH Outcome: Completed/Met Date Met:  12/08/13 Goal: IMPROVED WEIGHT-BEARING STATUS Outcome: Progressing Goal: ABLE TO AMBULATE INDEPENDENTLY Outcome: Progressing Goal: INCREASED STRENGTH-LOWER EXTREMITY Outcome: Progressing Goal: ABLE TO TRANSFER INDEPENDENTLY Outcome: Progressing Goal: ABLE TO PERFORM ADLS Outcome: Completed/Met Date Met:  12/08/13

## 2013-12-08 NOTE — Progress Notes (Signed)
   Name: Travis Chapman MRN: [id number] DOB: Jul 31, 1945    ADMISSION DATE:  12/02/2013 CONSULTATION DATE:  12/02/2013  REFERRING MD :  TRH  CHIEF COMPLAINT:  Mechanical fall, chronic respiratory failure s/p tracheostomy  BRIEF PATIENT DESCRIPTION: 60 yobm with morbid obesity complicated by OHS with chronic trach (diurnal ATC, noctural vent) for chronic resp failure brought to [facility] ED 11/17 after he suffered a few mechanical falls while at home.  Fully asymptomatic; however, admitted overnight by [facility] for observation.  PCCM consulted for vent management.  SIGNIFICANT EVENTS  11/17  Admitted after multiple falls 11/23  Awake, alert, appears to be at baseline.    STUDIES:  CT head 11/17 >>> negative  SUBJECTIVE: Pt reports feeling better this am, no acute c/o's.     VITAL SIGNS: Temp:  [97.9 F (36.6 C)-98.8 F (37.1 C)] 98.2 F (36.8 C) (11/23 0753) Pulse Rate:  [59-72] 60 (11/23 0800) Resp:  [14-19] 17 (11/23 0800) BP: (106-152)/(62-106) 109/89 mmHg (11/23 0921) SpO2:  [95 %-100 %] 95 % (11/23 0800) FiO2 (%):  [21 %-40 %] 40 % (11/23 0339) Weight:  [345 lb 4.8 oz (156.627 kg)] 345 lb 4.8 oz (156.627 kg) (11/23 0500)  PHYSICAL EXAMINATION: General: Morbidly obese male, up to chair, in NAD. Neuro: A&O x 3, non-focal.  HEENT: [place]/AT. PERRL, sclerae anicteric. chronic tracheostomy. Cardiovascular: RRR, no M/R/G appreciated. Lungs: Respirations even and unlabored.  CTA bilaterally, No W/R/R.  Chronic trach on RA Abdomen: Obese, BS x 4, soft, NT/ND.  Musculoskeletal: Chronic venous stasis changes bilaterally, no edema.  Skin: Intact, warm, no rashes.    Recent Labs Lab 12/03/13 0613 12/04/13 0349 12/06/13 0448  NA 139 138 136*  K 4.4 4.0 4.0  CL 103 102 99  CO2 25 22 26   BUN 19 18 20   CREATININE 1.52* 1.54* 1.58*  GLUCOSE 149* 146* 162*    Recent Labs Lab 12/03/13 0613 12/04/13 0349 12/07/13 0340  HGB 10.5* 10.7* 10.3*  HCT 32.8* 33.2* 31.8*  WBC 5.9 6.0 4.3  PLT  187 178 188   No results found.   ASSESSMENT / PLAN:  Chronic tracheostomy status - vent dependent nocturnally, ATC diurnally. Chronic respiratory failure due to OSA / OSH Hx PE - on coumadin, INR subtherapeutic on admission  Recs: Continue nocturnal vent support (per last discharge summary, prior settings were PRVC, Rate 14, VT 600, PEEP 5). Continue diurnal ATC, wean O2 for saturations of 88-92% Pulmonary hygiene. Continue coumadin per pharmacy. PRN bronchodilators. Push mobilization as able  Generalized deconditioning Mechanical fall  Recs: PT / OT consult in AM. ? Needs SNF rehab where he can do vent qhs as this is his 5th admit in last 6 months  Noe Gens, NP-C [facility] Pgr: [phone] or [phone]  Maintain nocturnal vent, ATC during the day and continue bronchodilators.  Patient seen and examined, agree with above note.  I dictated the care and orders written for this patient under my direction.  Rush Farmer, MD [phone]

## 2013-12-08 NOTE — Clinical Social Work Note (Addendum)
4:00pm CSW spoke with patients daughter- Julia- who stated that she did fill out and submit the [place] Medicaid application online.  CSW called [facility] to get confirmation-left message.  3:15 CSW met with patient at bedside to begin Medicaid application- patient reported that his daughter, Julia, had filled out something online over the weekend for Medicaid and he believed it was the application.    CSW called Julia to confirm- left message.  1:30pm CSW spoke with Stacy at [facility]- stated that [facility] needs confirmation from the county that the Medicaid application has been turned in before they are willing to accept patient again.  CSW spoke with Jenna at [facility]- stated that there are no vent beds available at this time but they will keep patient on file.  CSW contacted financial counseling to begin Medicaid application with patient- Medicaid worker is gone for this week and since patient has insurance already they are unlikely to see him soon.  Financial counseling provided CSW will Medicaid application to aid patient in filling out.  CSW will continue to follow.  Jenna Holoman, LCSWA Clinical Social Worker [phone]  

## 2013-12-08 NOTE — Progress Notes (Signed)
ANTICOAGULATION CONSULT NOTE - Follow Up Consult  Pharmacy Consult for Coumadin Indication: h/o PE  No Known Allergies  Patient Measurements: Height: 6\' 2"  (188 cm) Weight: (!) 345 lb 4.8 oz (156.627 kg) IBW/kg (Calculated) : 82.2 Heparin Dosing Weight:   Vital Signs: Temp: 98.2 F (36.8 C) (11/23 0753) Temp Source: Oral (11/23 0753) BP: 109/89 mmHg (11/23 0921) Pulse Rate: 60 (11/23 0800)  Labs:  Recent Labs  12/06/13 0448 12/07/13 0340 12/08/13 0320  HGB  --  10.3*  --   HCT  --  31.8*  --   PLT  --  188  --   LABPROT 22.4* 21.3* 21.4*  INR 1.95* 1.83* 1.83*  CREATININE 1.58*  --   --     Estimated Creatinine Clearance: 71.9 mL/min (by C-G formula based on Cr of 1.58).   Assessment: Generalized weakness and fall - 3 falls in the past 3 days  Anticoagulation: Warfarin PTA for hx PE ('12) ; on anticoag long-term. Dopplers neg for new DVT this admit. INR 1.62 on admit on  5 mg daily EXCEPT for 10 mg on MWF. INR remains low at 1.83 today.  Goal of Therapy:  INR 2-3 Monitor platelets by anticoagulation protocol: Yes   Plan:  Coumadin 12.5mg  po x 1 tonight. Daily INR   Mikel Hardgrove S. Alford Highland, PharmD, [place] Clinical Staff Pharmacist Pager [phone]  Eilene Ghazi Stillinger 12/08/2013,11:23 AM

## 2013-12-09 DIAGNOSIS — N183 Chronic kidney disease, stage 3 unspecified: Secondary | ICD-10-CM | POA: Insufficient documentation

## 2013-12-09 LAB — CULTURE, BLOOD (ROUTINE X 2): Culture: NO GROWTH

## 2013-12-09 LAB — GLUCOSE, CAPILLARY
GLUCOSE-CAPILLARY: 168 mg/dL — AB (ref 70–99)
Glucose-Capillary: 102 mg/dL — ABNORMAL HIGH (ref 70–99)
Glucose-Capillary: 152 mg/dL — ABNORMAL HIGH (ref 70–99)
Glucose-Capillary: 176 mg/dL — ABNORMAL HIGH (ref 70–99)

## 2013-12-09 LAB — PROTIME-INR
INR: 2.02 — AB (ref 0.00–1.49)
PROTHROMBIN TIME: 23 s — AB (ref 11.6–15.2)

## 2013-12-09 MED ORDER — WARFARIN SODIUM 10 MG PO TABS
10.0000 mg | ORAL_TABLET | Freq: Once | ORAL | Status: AC
  Administered 2013-12-09: 10 mg via ORAL
  Filled 2013-12-09: qty 1

## 2013-12-09 NOTE — Plan of Care (Signed)
Problem: Problem: Mobility Progression Goal: IMPROVED WEIGHT-BEARING STATUS Outcome: Progressing

## 2013-12-09 NOTE — Progress Notes (Signed)
   Name: Travis Chapman MRN: [id number] DOB: 1945-03-22    ADMISSION DATE:  12/02/2013 CONSULTATION DATE:  12/02/2013  REFERRING MD :  TRH  CHIEF COMPLAINT:  Mechanical fall, chronic respiratory failure s/p tracheostomy  BRIEF PATIENT DESCRIPTION: 36 yobm with morbid obesity complicated by OHS with chronic trach (diurnal ATC, noctural vent) for chronic resp failure brought to [facility] ED 11/17 after he suffered a few mechanical falls while at home.  Fully asymptomatic; however, admitted overnight by [facility] for observation.  PCCM consulted for vent management.  SIGNIFICANT EVENTS  11/17  Admitted after multiple falls 11/23  Awake, alert, appears to be at baseline.    STUDIES:  CT head 11/17 >>> negative  SUBJECTIVE: Pt reports feeling better this am, no acute c/o's.     VITAL SIGNS: Temp:  [98 F (36.7 C)-98.5 F (36.9 C)] 98.3 F (36.8 C) (11/24 0738) Pulse Rate:  [58-77] 63 (11/24 0740) Resp:  [12-25] 17 (11/24 0740) BP: (98-142)/(59-93) 129/93 mmHg (11/24 0740) SpO2:  [95 %-100 %] 100 % (11/24 0750) FiO2 (%):  [40 %] 40 % (11/24 0426) Weight:  [156.718 kg (345 lb 8 oz)] 156.718 kg (345 lb 8 oz) (11/24 0500)  PHYSICAL EXAMINATION: General: Morbidly obese male, up to chair, in NAD. Neuro: A&O x 3, non-focal.  HEENT: [place]/AT. PERRL, sclerae anicteric. chronic tracheostomy. Cardiovascular: RRR, no M/R/G appreciated. Lungs: Respirations even and unlabored.  CTA bilaterally, No W/R/R.  Chronic trach on RA Abdomen: Obese, BS x 4, soft, NT/ND.  Musculoskeletal: Chronic venous stasis changes bilaterally, no edema.  Skin: Intact, warm, no rashes.    Recent Labs Lab 12/03/13 0613 12/04/13 0349 12/06/13 0448  NA 139 138 136*  K 4.4 4.0 4.0  CL 103 102 99  CO2 25 22 26   BUN 19 18 20   CREATININE 1.52* 1.54* 1.58*  GLUCOSE 149* 146* 162*    Recent Labs Lab 12/03/13 0613 12/04/13 0349 12/07/13 0340  HGB 10.5* 10.7* 10.3*  HCT 32.8* 33.2* 31.8*  WBC 5.9 6.0 4.3  PLT 187 178 188    No results found.   ASSESSMENT / PLAN:  Chronic tracheostomy status - vent dependent nocturnally, ATC diurnally. Chronic respiratory failure due to OSA / OSH Hx PE - on coumadin, INR subtherapeutic on admission  Recs: Continue nocturnal vent support (per last discharge summary, prior settings were PRVC, Rate 14, VT 600, PEEP 5). Continue diurnal ATC, wean O2 for saturations of 88-92% Pulmonary hygiene. Continue coumadin per pharmacy. PRN bronchodilators. Push mobilization as able  Generalized deconditioning Mechanical fall  Recs: PT / OT consult in AM. ? Needs SNF rehab where he can do vent qhs as this is his 5th admit in last 6 months  Maintain nocturnal vent, ATC during the day and continue bronchodilators.  Rush Farmer, M.D. [facility] Pulmonary/Critical Care Medicine. Pager: [phone]. After hours pager: [phone].

## 2013-12-09 NOTE — Progress Notes (Signed)
Occupational Therapy Treatment Patient Details Name: Travis Chapman MRN: [id number] DOB: 05-Jan-1946 Today's Date: 12/09/2013    History of present illness Pt adm with weakness, deconditioning and incr falls. PMHx-Long term trach - on vent at night. Morbid obesity, DM, CHF, severe arthritis of knees with frequent falls and 7 admissions since 2/15 for falls   OT comments  Focus of session on UB exercises with level 2 theraband and review of use of AE for LB bathing and dressing.  Pt is eager to hear from [facility] about admission for rehab.  Follow Up Recommendations  SNF;Supervision/Assistance - 24 hour    Equipment Recommendations       Recommendations for Other Services      Precautions / Restrictions Precautions Precautions: Fall       Mobility Bed Mobility               General bed mobility comments: patient up in chair upon arrival  Transfers Overall transfer level: Needs assistance Equipment used: Rolling walker (2 wheeled) Transfers: Sit to/from Stand Sit to Stand: +2 physical assistance;Mod assist         General transfer comment: assist for stability and elevation to upright from low surface    Balance                                   ADL Overall ADL's : Needs assistance/impaired               Lower Body Bathing Details (indicate cue type and reason): pt educated in use of long handled bath sponge     Lower Body Dressing: Moderate assistance;Sit to/from stand Lower Body Dressing Details (indicate cue type and reason): practiced use of AE, pt has reacher and sock aide at home                      [place] During Therapy: [facility] for tasks assessed/performed Overall Cognitive Status: Within Functional Limits for tasks assessed                       Extremity/Trunk Assessment               Exercises General Exercises - Upper  Extremity Shoulder Flexion: Strengthening;Both;10 reps;Theraband;Seated Theraband Level (Shoulder Flexion): Level 2 (Red) Shoulder Extension: Strengthening;Both;10 reps;Seated;Theraband Theraband Level (Shoulder Extension): Level 2 (Red) Shoulder Horizontal ABduction: Strengthening;Both;10 reps;Seated;Theraband Theraband Level (Shoulder Horizontal Abduction): Level 2 (Red) Shoulder Horizontal ADduction: Strengthening;Both;10 reps;Seated;Theraband Theraband Level (Shoulder Horizontal Adduction): Level 2 (Red) Elbow Flexion: Strengthening;Both;10 reps;Seated;Theraband Theraband Level (Elbow Flexion): Level 2 (Red) (doubled) Elbow Extension: Strengthening;Both;10 reps;Seated;Theraband Theraband Level (Elbow Extension): Level 2 (Red) (doubled)   Shoulder Instructions       General Comments      Pertinent Vitals/ Pain       Pain Assessment: No/denies pain  Home Living                                          Prior Functioning/Environment              Frequency Min 2X/week  Progress Toward Goals  OT Goals(current goals can now be found in the care plan section)  Progress towards OT goals: Progressing toward goals  Acute Rehab OT Goals Time For Goal Achievement: 12/17/13 Potential to Achieve Goals: Good  Plan Discharge plan remains appropriate    Co-evaluation                 End of Session Equipment Utilized During Treatment: Rolling walker   Activity Tolerance Patient tolerated treatment well   Patient Left in chair;with call bell/phone within reach   Nurse Communication          Time: 1116-1140 OT Time Calculation (min): 24 min  Charges: OT General Charges $OT Visit: 1 Procedure OT Treatments $Self Care/Home Management : 8-22 mins $Therapeutic Exercise: 8-22 mins  Malka So 12/09/2013, 12:33 PM  [phone]

## 2013-12-09 NOTE — Progress Notes (Signed)
[place] TEAM 1 - Stepdown/ICU TEAM Progress Note  Gregory Barrick AOZ:[id number] DOB: 1945-07-17 DOA: 12/02/2013 PCP: Renato Shin, MD  Admit HPI / Brief Narrative: 68 y.o. 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 On previous discharge patient was to be placed in SNF.  Patient has complex hx of obesity hypoventilation syndrome with chronic respiratory failure tracheostomy dependent and ventilator dependent at night.  The patient is presenting with 3 falls in the last 3 days. Lives at home with his daughter. He was recently hospitalized in September with similar situation and at that point was recommended to be discharged at [facility] but he decided to go home instead and works with home health and home physical therapy. He has been working with home health and home physical therapy throughout the month of October without any significant improvement. Last week he started having recurrent fall in the fall he describes are due to generalized weakness of bilateral lower legs. Patient denies any focal deficit head injury and neck injury a pleasant episode. Patient denies any chest pain or tachycardia. Patient mentions he is taking all his medications regularly and there is no recent change in his medication. Patient also denies any complaint of fever, chills, nausea, vomiting, abdominal pain, diarrhea, burning urination.  The patient is coming from home And at his baseline independent for most of his ADL.   HPI/Subjective: 11/19 A/O 4,Patient states he understands that it is not safe for him to stay at home alone and that he requires SNF.  Assessment/Plan: Generalized weakness / deconditioned state -Recurring problem, agrees to SNF  facility  -Recommend that PATIENT NOT BE READMITTED FOR FALL, unless evidence of acute treatable disease process.  Blood cultures 1 of 2 with coag negative species - contaminant and does not require antibiotic treatment   CKD stage III  -baseline creatinine 1.3-2  - Cr at baseline    Chronic diastolic heart failure  -EF 50-55% via TTE April 2015, w/ grade 1 DD  - Admission weight 158.5 kg, ; base weight ~160.6 kg, 11/24 weight at discharge= 156.7 kg  -continue strict in and out -Daily a.m. weight  Morbid obesity - Body mass index is 43.52 kg/(m^2).   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  -CBG reasonably controlled at present  - 9/10 hemoglobin A1c = 7.3  HTN - uncontrolled  -Continue amlodipine 10 mg daily  -Continue Coreg 6.25 mg BID  -Continue clonidine 0.1 mg BID  -Continue Lasix 60 mg daily -Continue losartan 50 mg daily   HLD  -Continue Zocor 20 mg daily   Severe B knee pain  -Chronic, no change from previous admission  - PT/OT; recommends SNF   - pain controlwith oxycodone IR 10 mg PO every 4 hours PRN moderate pain     - Code Status: FULL  Family Communication: no family present at time of exam  Disposition Plan: Patient has agreed to SNF in [place]      Consultants: NA  Procedure/Significant Events: 4/12 echocardiogram;- Left ventricle: mildly dilated. mild LVH.- LVEF= 50% to 55%.  -(grade 1 diastolic dysfunction).   Culture   NA  Antibiotics: NA  DVT prophylaxis: Coumadin  Devices Chronic trach present  LINES / TUBES:     Continuous Infusions:    Objective: VITAL SIGNS: Temp: 98.3 F (36.8 C) (11/24 0738) Temp Source: Oral (11/24 0738) BP: 129/93 mmHg (11/24 0740) Pulse Rate: 63 (11/24 0740) SPO2; FIO2:   Intake/Output Summary (Last 24 hours) at 12/09/13 0936 Last data filed at 12/08/13 2159  Gross per 24 hour  Intake      3 ml  Output    900 ml  Net   -897 ml     Exam: General: A/O 4, NAD, sitting in chair on room air, No acute respiratory distress, tracheostomy in place negative sign of infection. Lungs: Clear to auscultation bilaterally without wheezes or crackles Cardiovascular: Regular rate and rhythm without murmur gallop or rub normal S1 and S2 Abdomen: Morbidly obese, Nontender, nondistended, soft, bowel sounds positive, no rebound, no ascites, no appreciable mass Extremities: Bilateral lower extremity venous stasis ulcers which are healing well (eschar over both), No significant cyanosis, clubbing, or edema bilateral lower extremities  Data Reviewed: Basic Metabolic Panel:  Recent Labs Lab 12/02/13 1707 12/02/13 2125 12/03/13 0613 12/04/13 0349 12/06/13 0448  NA 138  --  139 138 136*  K 4.9  --  4.4 4.0 4.0  CL 102  --  103 102 99  CO2 27  --  25 22 26   GLUCOSE 183*  --  149* 146* 162*  BUN 21  --  19 18 20   CREATININE 1.54*  --  1.52* 1.54* 1.58*  CALCIUM 9.3  --  8.8 8.6 8.8  MG  --  2.3  --   --   --   PHOS  --  3.4  --  3.2  --    Liver Function Tests:  Recent Labs Lab 12/02/13 1707 12/03/13 0613 12/04/13 0349 12/06/13 0448  AST 22 20  --  12  ALT 11 9  --  9  ALKPHOS 70 59  --  60  BILITOT 0.5 0.4  --  <0.2*  PROT 7.3 6.3  --  6.3  ALBUMIN 3.1* 2.8* 2.6* 2.6*    Recent Labs Lab 12/02/13 1707  LIPASE 16    Recent Labs Lab 12/02/13 1707  AMMONIA 31   CBC:  Recent Labs Lab 12/02/13 1707 12/03/13 0613 12/04/13 0349 12/07/13 0340  WBC 7.3 5.9 6.0 4.3  NEUTROABS 6.0 4.4  --   --   HGB 11.6* 10.5* 10.7* 10.3*  HCT 35.4* 32.8* 33.2* 31.8*  MCV 85.1 85.4 84.9  83.5  PLT 192 187 178 188   Cardiac Enzymes:  Recent Labs Lab 12/02/13 1707 12/02/13 2125  CKTOTAL  --  370*  TROPONINI <0.30  --    BNP (last 3 results)  Recent Labs  08/03/13 1600 09/24/13 1701 12/03/13 0121  PROBNP 844.9* 7.4 1519.0*   CBG:  Recent Labs Lab 12/08/13 0750 12/08/13 1211 12/08/13 1716 12/08/13 2110 12/09/13 0737  GLUCAP 114* 141* 135* 164* 102*    Recent Results (from the past 240 hour(s))  Urine culture     Status: None   Collection Time: 12/02/13  5:01 PM  Result Value Ref Range Status   Specimen Description URINE, CATHETERIZED  Final   Special Requests NONE  Final   Culture  Setup Time   Final    12/02/2013 20:00  Performed at [place] Performed at [facility]   Final   Culture NO GROWTH Performed at [facility]   Final   Report Status 12/03/2013 FINAL  Final  Culture, blood (routine x 2)     Status: None   Collection Time: 12/02/13  5:07 PM  Result Value Ref Range Status   Specimen Description BLOOD RIGHT FOREARM  Final   Special Requests BOTTLES DRAWN AEROBIC AND ANAEROBIC 10CC  Final   Culture  Setup Time   Final    12/03/2013 00:26 Performed at [facility]    Culture   Final    STAPHYLOCOCCUS SPECIES (COAGULASE NEGATIVE) Note: THE SIGNIFICANCE OF ISOLATING THIS ORGANISM FROM A SINGLE SET OF BLOOD CULTURES WHEN MULTIPLE SETS ARE DRAWN IS UNCERTAIN. PLEASE NOTIFY THE MICROBIOLOGY DEPARTMENT WITHIN ONE WEEK IF SPECIATION AND SENSITIVITIES ARE REQUIRED. Note: Gram Stain Report Called to,Read Back By and Verified With: MICHELLE MCMILLAN ON 12/03/2013 AT 9:22P BY Dennard Nip Performed at [facility]    Report Status 12/06/2013 FINAL  Final  Culture, blood (routine x 2)     Status: None   Collection Time: 12/02/13  6:11 PM  Result Value Ref Range Status   Specimen Description BLOOD LEFT HAND  Final   Special Requests   Final    BOTTLES DRAWN AEROBIC AND ANAEROBIC  5CC AER,2CC ANA   Culture  Setup Time   Final    12/03/2013 00:47 Performed at [facility]    Culture   Final    NO GROWTH 5 DAYS Performed at [facility]    Report Status 12/09/2013 FINAL  Final  Culture, respiratory (NON-Expectorated)     Status: None   Collection Time: 12/02/13  9:15 PM  Result Value Ref Range Status   Specimen Description SPUTUM  Final   Special Requests NONE  Final   Gram Stain   Final    MODERATE WBC PRESENT, PREDOMINANTLY PMN MODERATE SQUAMOUS EPITHELIAL CELLS PRESENT FEW GRAM POSITIVE COCCI IN PAIRS IN CHAINS FEW GRAM POSITIVE RODS FEW GRAM NEGATIVE COCCI    Culture   Final    NORMAL OROPHARYNGEAL FLORA Performed at [facility]    Report Status 12/05/2013 FINAL  Final  MRSA PCR Screening     Status: None   Collection Time: 12/03/13 12:16 AM  Result Value Ref Range Status   MRSA by PCR NEGATIVE NEGATIVE Final    Comment:        The GeneXpert MRSA Assay (FDA approved for NASAL specimens only), is one component of a comprehensive MRSA colonization surveillance program. It is not intended to diagnose MRSA infection nor to guide or monitor treatment for MRSA infections.      Studies:  Recent x-ray studies have been reviewed in detail by the Attending Physician  Scheduled Meds:  Scheduled Meds: . amLODipine  10 mg Oral Daily  . antiseptic oral rinse  7 mL Mouth Rinse QID  . carvedilol  6.25 mg Oral BID WC  . chlorhexidine  15 mL Mouth Rinse BID  . citalopram  20 mg Oral Daily  . cloNIDine  0.1 mg Oral BID  . furosemide  60 mg Oral Daily  . insulin aspart  0-15 Units Subcutaneous TID WC  . insulin aspart  0-5 Units Subcutaneous QHS  . insulin glargine  28 Units Subcutaneous q morning - 10a  . losartan  50 mg Oral Daily  . metoCLOPramide  5 mg  Oral TID  . polyethylene glycol  17 g Oral Daily  . potassium chloride SA  40 mEq Oral Daily  . senna-docusate  1 tablet Oral BID  . simvastatin  20 mg Oral q1800  .  sodium chloride  3 mL Intravenous Q12H  . warfarin  10 mg Oral ONCE-1800  . Warfarin - Pharmacist Dosing Inpatient   Does not apply q1800    Time spent on care of this patient: 40 mins   Allie Bossier , MD   [facility] Office  [phone] Pager [phone]  On-Call/Text Page:      [email or web address]      password [email or web address]  If 7PM-7AM, please contact night-coverage [email or web address] Password [email or web address] 12/09/2013, 9:36 AM   LOS: 7 days

## 2013-12-09 NOTE — Progress Notes (Signed)
ANTICOAGULATION CONSULT NOTE - Follow Up Consult  Pharmacy Consult for Coumadin Indication: h/o PE  No Known Allergies  Patient Measurements: Height: 6\' 2"  (188 cm) Weight: (!) 345 lb 8 oz (156.718 kg) IBW/kg (Calculated) : 82.2 Heparin Dosing Weight:   Vital Signs: Temp: 98 F (36.7 C) (11/24 0426) Temp Source: Oral (11/24 0426) BP: 112/64 mmHg (11/24 0426) Pulse Rate: 58 (11/24 0426)  Labs:  Recent Labs  12/07/13 0340 12/08/13 0320 12/09/13 0325  HGB 10.3*  --   --   HCT 31.8*  --   --   PLT 188  --   --   LABPROT 21.3* 21.4* 23.0*  INR 1.83* 1.83* 2.02*    Estimated Creatinine Clearance: 71.9 mL/min (by C-G formula based on Cr of 1.58).   Assessment: Generalized weakness and fall - 3 falls in the past 3 days  Anticoagulation: Warfarin PTA for hx PE ('12) ; on anticoag long-term. Dopplers neg for new DVT this admit. INR 1.62 on admit on  5 mg daily EXCEPT for 10 mg on MWF. INR therapeutic today at 2.02  Goal of Therapy:  INR 2-3 Monitor platelets by anticoagulation protocol: Yes   Plan:  Coumadin 10mg  po x 1 tonight. Daily INR Thanks for allowing pharmacy to be a part of this patient's care.  Excell Seltzer, PharmD Clinical Pharmacist, [phone]   12/09/2013,7:28 AM

## 2013-12-09 NOTE — Clinical Social Work Note (Addendum)
3:30pm  CSW spoke with patient and patients children at bedside and informed that Randall Hiss would be meeting with them tomorrow to fill out [place] Medicaid.  Patient and family expressed understanding and continues to be agreeable to plan at this time.  CSW informed family that patient will discharge as soon as bed is available and Medicaid application is finalized.  3:00pm  CSW spoke with Randall Hiss with [facility]- Randall Hiss will come to complete Medicaid application with the patient tomorrow, 11/25, between 11:00am-12:00pm.    CSW informed patients daughter, [name], of the changes in plan and encouraged her to come tomorrow to meet with Randall Hiss and help fill out the Medicaid form.  1:30pm  CSW spoke with the [facility], Shanon Brow [phone] ext [phone]), concerning patients eligibility for placement at [facility].  Shanon Brow reported that patient is out of Medicare days and, therefore, does not have a primary payer.  Patients family has applied for [place] Medicaid but [facility] does not accept Medicaid as a primary payer.    Patient was informed that unless he wanted to go to a facility that is further away he would need to go to [place] for SNF.  Patient intially refused and stated that he would go home without home PT.   RN Case Manager went to speak with patient concerning home health options and patient stated that he changed his mind and would be willing to go to [place].  CSW faxed information to [facility] and to [facility] SNFs. [facility] does not accept patients over 300 pounds.  CSW spoke with Tammy at [facility] center and informed of new referral and informed that patient is medically ready to DC as soon as bed available.   CSW then called [facility] worker, Randall Hiss, left message.  CSW will continue to follow.  Domenica Reamer, [place] Social Worker [phone]

## 2013-12-10 LAB — PROTIME-INR
INR: 2.18 — AB (ref 0.00–1.49)
Prothrombin Time: 24.5 seconds — ABNORMAL HIGH (ref 11.6–15.2)

## 2013-12-10 LAB — GLUCOSE, CAPILLARY
GLUCOSE-CAPILLARY: 157 mg/dL — AB (ref 70–99)
GLUCOSE-CAPILLARY: 151 mg/dL — AB (ref 70–99)
Glucose-Capillary: 122 mg/dL — ABNORMAL HIGH (ref 70–99)
Glucose-Capillary: 105 mg/dL — ABNORMAL HIGH (ref 70–99)

## 2013-12-10 MED ORDER — WARFARIN SODIUM 10 MG PO TABS
10.0000 mg | ORAL_TABLET | Freq: Once | ORAL | Status: AC
  Administered 2013-12-10: 10 mg via ORAL
  Filled 2013-12-10: qty 1

## 2013-12-10 NOTE — Progress Notes (Signed)
[place] TEAM 1 - Stepdown/ICU TEAM  Travis Chapman SWN:[id number] DOB: Oct 30, 1945 DOA: 12/02/2013 PCP: Renato Shin, MD  Brief narrative:   68 y.o. male with history of obesity hypoventilation syndrome status post tracheostomy on nightly vent, diabetes mellitus with diabetic neuropathy, chronic diastolic heart failure, hypertension, physical deconditioning, and chronic anemia who presented with 3 falls in 3 days. He has been living at home with his daughter.   He was hospitalized in September with similar complaints and at that point was recommended to be discharged to an LTAC but he decided to go home instead and work with home health and home physical therapy. He has been doing so throughout the month of October without any significant improvement. He then started having recurrent falls due to generalized weakness of both legs.  Assessment/Plan:    Generalized weakness / deconditioned state / functional quadriplegia - pt has had PT evaluation which recommended SNF placement; pt with frequent recurrent falls - Vent SNF at discharge w/ plans for facility in [place] as soon as paperwork goes through (anticipate early next week) - SW assisting   Blood cultures 1 of 2 with coag negative species - contaminant and does not require antibiotic treatment   CKD stage III  - baseline creatinine 1.3 - 2  - crt is stable at baseline  Chronic systolic and  diastolic heart failure  - EF 50-55% on TTE April 2015, w/ grade 1 diastolic dysfunction - strict in and out; fluid balance -9.5L thus far  - weight since admission 158.58 kg --> 156 kg; continue daily weight  - continue coreg, lasix, losartan  History of pulmonary embolism - continue coumadin dosing per pharmacy  - no reports of bleeding  Chronic hypercarbic resp failure / OHS / Sleep apnea s/p trach / chronic nocturnal vent support  - Vent support per PCCM, overnight only - ATC during day  - respiratory status is stable   DM  w/ neurologic complications and GI complications  - X3G on 1/82/99 was 7.3 - CBG reasonably controlled presently  - continue Lantus along with SSI - Reglan for gastroparesis  Essential hypertension - Bp reasonably well controlled at this time - no change in tx plan today   HLD  - Continue Zocor  Severe knee pain bilaterally  - Chronic, no change from previous admission  - PT/OT recommends SNF   - pain controlwith oxycodone IR 10 mg PO every 4 hours PRN moderate pain   Constipation - continue senna, miralax  DVT Prophylaxis  coumadin   Code Status: DNR/DNI Family Communication:  Family not at the bedside  Disposition Plan: to SNF when bed available (early next week)   Procedures and diagnostic studies:    none  Medical Consultants:  PCCM  Anti-Infectives:   none  HPI/Subjective: No new complaints today.  Ambulated w/ PT this morning.      Objective: Filed Vitals:   12/10/13 0500 12/10/13 0725 12/10/13 1145 12/10/13 1154  BP:  100/58  130/67  Pulse:  64 60 59  Temp:  98 F (36.7 C)  97.8 F (36.6 C)  TempSrc:  Oral  Oral  Resp:  12 17 22   Height:      Weight: 156.037 kg (344 lb)     SpO2:  96% 97% 97%    Intake/Output Summary (Last 24 hours) at 12/10/13 1351 Last data filed at 12/10/13 0900  Gross per 24 hour  Intake    243 ml  Output   1800 ml  Net  -  1557 ml   Exam: General: No acute respiratory distress - alert and pleasant  Lungs: Clear to auscultation bilaterally without wheezes or crackles - distant bs due to obesity  Cardiovascular: Regular rate and rhythm without murmur gallop or rub - distant HS  Abdomen: Nontender, morbidly obese, soft, bowel sounds positive, no rebound, no ascites, no appreciable mass Extremities: No significant cyanosis, clubbing;  1+ edema bilateral lower extremities  Data Reviewed: Basic Metabolic Panel:  Recent Labs Lab 12/04/13 0349 12/06/13 0448  NA 138 136*  K 4.0 4.0  CL 102 99  CO2 22 26  GLUCOSE  146* 162*  BUN 18 20  CREATININE 1.54* 1.58*  CALCIUM 8.6 8.8  PHOS 3.2  --    Liver Function Tests:  Recent Labs Lab 12/04/13 0349 12/06/13 0448  AST  --  12  ALT  --  9  ALKPHOS  --  60  BILITOT  --  <0.2*  PROT  --  6.3  ALBUMIN 2.6* 2.6*   CBC:  Recent Labs Lab 12/04/13 0349 12/07/13 0340  WBC 6.0 4.3  HGB 10.7* 10.3*  HCT 33.2* 31.8*  MCV 84.9 83.5  PLT 178 188   CBG:  Recent Labs Lab 12/09/13 1228 12/09/13 1601 12/09/13 2207 12/10/13 0732 12/10/13 1158  GLUCAP 152* 176* 168* 157* 122*    Urine culture     Status: None   Collection Time: 12/02/13  5:01 PM  Result Value Ref Range Status   Specimen Description URINE, CATHETERIZED  Final   Special Requests NONE  Final   Culture  Setup Time   Final   Colony Count NO GROWTH  Final   Report Status 12/03/2013 FINAL  Final  Culture, blood (routine x 2)     Status: None   Collection Time: 12/02/13  5:07 PM  Result Value Ref Range Status   Specimen Description BLOOD RIGHT FOREARM  Final   Special Requests BOTTLES DRAWN AEROBIC AND ANAEROBIC 10CC  Final   Culture  Setup Time   Final   Culture   Final    STAPHYLOCOCCUS SPECIES (COAGULASE NEGATIVE)   Report Status 12/06/2013 FINAL  Final  Culture, blood (routine x 2)     Status: None (Preliminary result)   Collection Time: 12/02/13  6:11 PM  Result Value Ref Range Status   Specimen Description BLOOD LEFT HAND  Final   Special Requests   Final    BOTTLES DRAWN AEROBIC AND ANAEROBIC 5CC AER,2CC ANA   Culture  Setup Time   Final   Culture   Final           BLOOD CULTURE RECEIVED NO GROWTH TO DATE    Report Status PENDING  Incomplete  Culture, respiratory (NON-Expectorated)     Status: None   Collection Time: 12/02/13  9:15 PM  Result Value Ref Range Status   Specimen Description SPUTUM  Final   Special Requests NONE  Final   Gram Stain   Final   Culture   Final    NORMAL OROPHARYNGEAL FLORA   Report Status 12/05/2013 FINAL  Final  MRSA PCR Screening      Status: None   Collection Time: 12/03/13 12:16 AM  Result Value Ref Range Status   MRSA by PCR NEGATIVE NEGATIVE Final    Comment:           Scheduled Meds: . amLODipine  10 mg Oral Daily  . carvedilol  6.25 mg Oral BID WC  . citalopram  20 mg Oral Daily  .  cloNIDine  0.1 mg Oral BID  . docusate sodium  100 mg Oral BID  . furosemide  60 mg Oral Daily  . insulin aspart  0-15 Units Subcutaneous TID WC  . insulin aspart  0-5 Units Subcutaneous QHS  . insulin glargine  28 Units Subcutaneous q morning - 10a  . losartan  50 mg Oral Daily  . metoCLOPramide  5 mg Oral TID  . polyethylene glycol  17 g Oral Daily  . potassium chloride SA  40 mEq Oral Daily  . senna-docusate  1 tablet Oral BID  . simvastatin  20 mg Oral q1800  . Warfarin -   Does not apply [id number]   Cherene Altes, MD [facility] For Consults/Admissions - Flow Manager - [phone] Office  [phone] Pager [phone]  On-Call/Text Page:      [email or web address]      password [facility]  12/10/2013 1:51 PM

## 2013-12-10 NOTE — Plan of Care (Signed)
Problem: Problem: Mobility Progression Goal: IMPROVED WEIGHT-BEARING STATUS Outcome: Progressing Goal: INCREASED STRENGTH-LOWER EXTREMITY Outcome: Progressing

## 2013-12-10 NOTE — Progress Notes (Signed)
   Name: Travis Chapman MRN: [id number] DOB: [birth year]    ADMISSION DATE:  12/02/2013 CONSULTATION DATE:  12/02/2013  REFERRING MD :  TRH  CHIEF COMPLAINT:  Mechanical fall, chronic respiratory failure s/p tracheostomy  BRIEF PATIENT DESCRIPTION: 35 yobm with morbid obesity complicated by OHS with chronic trach (diurnal ATC, noctural vent) for chronic resp failure brought to [facility] ED 11/17 after he suffered a few mechanical falls while at home.  Fully asymptomatic; however, admitted overnight by [facility] for observation.  PCCM consulted for vent management.  SIGNIFICANT EVENTS  11/17  Admitted after multiple falls 11/23  Awake, alert, appears to be at baseline.    STUDIES:  CT head 11/17 >>> negative  SUBJECTIVE: Less than pleased about news that he has to go to SNF but understands the reasoning.  VITAL SIGNS: Temp:  [98 F (36.7 C)-98.3 F (36.8 C)] 98.1 F (36.7 C) (11/25 0013) Pulse Rate:  [54-68] 54 (11/25 0358) Resp:  [15-22] 15 (11/25 0358) BP: (110-136)/(64-93) 116/65 mmHg (11/25 0013) SpO2:  [94 %-100 %] 99 % (11/25 0358) FiO2 (%):  [40 %] 40 % (11/25 0358) Weight:  [156.718 kg (345 lb 8 oz)] 156.718 kg (345 lb 8 oz) (11/24 0500)  PHYSICAL EXAMINATION: General: Morbidly obese male, up to chair, in NAD. Neuro: A&O x 3, non-focal.  HEENT: [place]/AT. PERRL, sclerae anicteric. chronic tracheostomy. Cardiovascular: RRR, no M/R/G appreciated. Lungs: Respirations even and unlabored.  CTA bilaterally, No W/R/R.  Chronic trach on RA Abdomen: Obese, BS x 4, soft, NT/ND.  Musculoskeletal: Chronic venous stasis changes bilaterally, no edema.  Skin: Intact, warm, no rashes.    Recent Labs Lab 12/03/13 0613 12/04/13 0349 12/06/13 0448  NA 139 138 136*  K 4.4 4.0 4.0  CL 103 102 99  CO2 25 22 26   BUN 19 18 20   CREATININE 1.52* 1.54* 1.58*  GLUCOSE 149* 146* 162*    Recent Labs Lab 12/03/13 0613 12/04/13 0349 12/07/13 0340  HGB 10.5* 10.7* 10.3*  HCT 32.8* 33.2* 31.8*  WBC  5.9 6.0 4.3  PLT 187 178 188   No results found.   ASSESSMENT / PLAN:  Chronic tracheostomy status - vent dependent nocturnally, ATC diurnally. Chronic respiratory failure due to OSA / OSH Hx PE - on coumadin, INR subtherapeutic on admission  Recs: Continue nocturnal vent support (per last discharge summary, prior settings were PRVC, Rate 14, VT 600, PEEP 5). Continue diurnal ATC, wean O2 for saturations of 88-92% Pulmonary hygiene. Continue coumadin per pharmacy. PRN bronchodilators. Push mobilization as able  Generalized deconditioning Mechanical fall  Recs: PT / OT consult in AM. ? Needs SNF rehab where he can do vent qhs as this is his 5th admit in last 6 months  Maintain nocturnal vent, ATC during the day and continue bronchodilators, no needs for further weaning efforts.  Rush Farmer, M.D. [facility] Pulmonary/Critical Care Medicine. Pager: [phone]. After hours pager: [phone].

## 2013-12-10 NOTE — Progress Notes (Signed)
ANTICOAGULATION CONSULT NOTE - Follow Up Consult  Pharmacy Consult for Coumadin Indication: h/o PE  No Known Allergies  Patient Measurements: Height: 6\' 2"  (188 cm) Weight: (!) 344 lb (156.037 kg) IBW/kg (Calculated) : 82.2 Heparin Dosing Weight:   Vital Signs: Temp: 98 F (36.7 C) (11/25 0725) Temp Source: Oral (11/25 0725) BP: 100/58 mmHg (11/25 0725) Pulse Rate: 64 (11/25 0725)  Labs:  Recent Labs  12/08/13 0320 12/09/13 0325 12/10/13 0311  LABPROT 21.4* 23.0* 24.5*  INR 1.83* 2.02* 2.18*    Estimated Creatinine Clearance: 71.7 mL/min (by C-G formula based on Cr of 1.58).   Assessment: Generalized weakness and fall - 3 falls in the past 3 days  Anticoagulation: Warfarin PTA for hx PE ('12) ; on anticoag long-term. Dopplers neg for new DVT this admit. INR 1.62 on admit on  5 mg daily EXCEPT for 10 mg on MWF. INR therapeutic today at 2.18  Goal of Therapy:  INR 2-3 Monitor platelets by anticoagulation protocol: Yes   Plan:  Coumadin 10mg  po x 1 tonight. Daily INR Thanks for allowing pharmacy to be a part of this patient's care.  Erin Hearing PharmD., BCPS Clinical Pharmacist Pager [phone] 12/10/2013 11:34 AM

## 2013-12-10 NOTE — Progress Notes (Signed)
Physical Therapy Treatment Patient Details Name: Travis Chapman MRN: [id number] DOB: [birth year] Today's Date: 12/10/2013    History of Present Illness Pt adm with weakness, deconditioning and incr falls. PMHx-Long term trach - on vent at night. Morbid obesity, DM, CHF, severe arthritis of knees with frequent falls and 7 admissions since 2/15 for falls    PT Comments    Patient progressing able to meet current PT goals (updated today).  Feel can improve with further skilled PT in an inpatient setting as well as work to prevent further falls.  Follow Up Recommendations  SNF     Equipment Recommendations  None recommended by PT    Recommendations for Other Services       Precautions / Restrictions Precautions Precautions: Fall Precaution Comments: trach    Mobility  Bed Mobility               General bed mobility comments: up in chair  Transfers Overall transfer level: Needs assistance Equipment used: Rolling walker (2 wheeled)   Sit to Stand: Mod assist         General transfer comment: uses UE's and momentum to stand from recliner  Ambulation/Gait Ambulation/Gait assistance: Min assist (with one following with chair) Ambulation Distance (Feet): 80 Feet Assistive device: Rolling walker (2 wheeled) Gait Pattern/deviations: Step-through pattern;Decreased stride length;Antalgic     General Gait Details: though does report pain in knees with ambulation, improved quickly with seated rest; chair following patient throughout   Stairs            Wheelchair Mobility    Modified Rankin (Stroke Patients Only)       Balance Overall balance assessment: Needs assistance         Standing balance support: Bilateral upper extremity supported Standing balance-Leahy Scale: Poor Standing balance comment: UE assist needed due to knee pain and instability                    Cognition Arousal/Alertness: Awake/alert Behavior During Therapy: WFL for  tasks assessed/performed Overall Cognitive Status: Within Functional Limits for tasks assessed                      Exercises General Exercises - Lower Extremity Long Arc Quad: AROM;Both;10 reps;Seated (w/ 5 sec hold) Hip ABduction/ADduction: AROM;Both;10 reps;Seated Hip Flexion/Marching: AROM;Both;10 reps;Seated Toe Raises: AROM;10 reps;Seated;Both Heel Raises: AROM;10 reps;Seated;Both    General Comments        Pertinent Vitals/Pain Pain Score: 10-Worst pain ever Pain Location: knees with ambulation, returns to baseline quickly sitting Pain Intervention(s): Monitored during session;Repositioned    Home Living                      Prior Function            PT Goals (current goals can now be found in the care plan section) Progress towards PT goals: Progressing toward goals    Frequency  Min 3X/week    PT Plan Current plan remains appropriate    Co-evaluation             End of Session Equipment Utilized During Treatment: Gait belt Activity Tolerance: Patient tolerated treatment well Patient left: in chair;with call bell/phone within reach     Time: 1313-1336 PT Time Calculation (min) (ACUTE ONLY): 23 min  Charges:  $Gait Training: 8-22 mins $Therapeutic Exercise: 8-22 mins  G Codes:      Morrigan Wickens,[name] 12/10/2013, 2:33 PM [name], [place] 12/10/2013

## 2013-12-10 NOTE — Clinical Social Work Note (Signed)
CSW received call from [facility] worker stating that he will be unable to come to [place] today to fill out application with the patient- he will reschedule for Friday, November 27th.  CSW informed patients daughter of change is plan- she also stated that patient has family who lives in [place] about 45 minutes from the [facility] which makes her more comfortable with the current plan to transfer the patient.  CSW will continue to follow.  Domenica Reamer, [place] Social Worker [phone]

## 2013-12-11 LAB — CBC
HEMATOCRIT: 32.4 % — AB (ref 39.0–52.0)
Hemoglobin: 10.7 g/dL — ABNORMAL LOW (ref 13.0–17.0)
MCH: 27.6 pg (ref 26.0–34.0)
MCHC: 33 g/dL (ref 30.0–36.0)
MCV: 83.5 fL (ref 78.0–100.0)
PLATELETS: 178 10*3/uL (ref 150–400)
RBC: 3.88 MIL/uL — AB (ref 4.22–5.81)
RDW: 12.9 % (ref 11.5–15.5)
WBC: 4.9 10*3/uL (ref 4.0–10.5)

## 2013-12-11 LAB — BASIC METABOLIC PANEL
ANION GAP: 12 (ref 5–15)
BUN: 28 mg/dL — ABNORMAL HIGH (ref 6–23)
CHLORIDE: 101 meq/L (ref 96–112)
CO2: 22 meq/L (ref 19–32)
Calcium: 8.9 mg/dL (ref 8.4–10.5)
Creatinine, Ser: 1.63 mg/dL — ABNORMAL HIGH (ref 0.50–1.35)
GFR calc Af Amer: 49 mL/min — ABNORMAL LOW (ref >90)
GFR calc non Af Amer: 42 mL/min — ABNORMAL LOW (ref >90)
Glucose, Bld: 119 mg/dL — ABNORMAL HIGH (ref 70–99)
POTASSIUM: 4.5 meq/L (ref 3.7–5.3)
Sodium: 135 mEq/L — ABNORMAL LOW (ref 137–147)

## 2013-12-11 LAB — GLUCOSE, CAPILLARY
Glucose-Capillary: 130 mg/dL — ABNORMAL HIGH (ref 70–99)
Glucose-Capillary: 143 mg/dL — ABNORMAL HIGH (ref 70–99)
Glucose-Capillary: 196 mg/dL — ABNORMAL HIGH (ref 70–99)
Glucose-Capillary: 142 mg/dL — ABNORMAL HIGH (ref 70–99)

## 2013-12-11 LAB — PROTIME-INR
INR: 2.09 — AB (ref 0.00–1.49)
Prothrombin Time: 23.7 seconds — ABNORMAL HIGH (ref 11.6–15.2)

## 2013-12-11 MED ORDER — WARFARIN SODIUM 2.5 MG PO TABS
12.5000 mg | ORAL_TABLET | Freq: Once | ORAL | Status: AC
  Administered 2013-12-11: 12.5 mg via ORAL
  Filled 2013-12-11: qty 1

## 2013-12-11 NOTE — Progress Notes (Signed)
ANTICOAGULATION CONSULT NOTE - Follow Up Consult  Pharmacy Consult for Coumadin Indication: h/o PE  No Known Allergies  Patient Measurements: Height: 6\' 2"  (188 cm) Weight: (!) 343 lb 12.8 oz (155.947 kg) IBW/kg (Calculated) : 82.2  Vital Signs: Temp: 98 F (36.7 C) (11/26 0831) Temp Source: Oral (11/26 0831) BP: 153/79 mmHg (11/26 0831) Pulse Rate: 60 (11/26 1108)  Labs:  Recent Labs  12/09/13 0325 12/10/13 0311 12/11/13 0300  HGB  --   --  10.7*  HCT  --   --  32.4*  PLT  --   --  178  LABPROT 23.0* 24.5* 23.7*  INR 2.02* 2.18* 2.09*  CREATININE  --   --  1.63*    Estimated Creatinine Clearance: 69.5 mL/min (by C-G formula based on Cr of 1.63).   Assessment: 68 yo F admitted on 12/02/2013 with generalized weakness and fall - 3 falls in the past 3 days. Patient is on Warfarin PTA for hx PE ('12). Dopplers neg for new DVT this admit. INR 1.62 on admit on  5 mg daily EXCEPT for 10 mg on MWF. INR remains therapeutic today at 2.09 but has trended down slightly since yesterday on increased doses. H/H remains low but stable, plt wnl and stable with no reported s/s bleeding.   Goal of Therapy:  INR 2-3 Monitor platelets by anticoagulation protocol: Yes   Plan:  - Coumadin 12.5 mg PO x 1 tonight to attempt to maintain in goal range - Daily INR - Monitor CBC and for s/s bleeding  Thanks for allowing pharmacy to be a part of this patient's care.  Ruta Hinds. Velva Harman, PharmD Clinical Pharmacist - Resident Pager: [phone] Pharmacy: [phone] 12/11/2013 12:12 PM

## 2013-12-11 NOTE — Progress Notes (Signed)
[facility] TEAM 1 - Stepdown/ICU TEAM Progress Note  Travis Chapman OHY:[id number] DOB: 1945/10/17 DOA: 12/02/2013 PCP: Renato Shin, MD  Admit HPI / Brief Narrative: 68 y.o. 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 On previous discharge patient was to be placed in SNF.  Patient has complex hx of obesity hypoventilation syndrome with chronic respiratory failure tracheostomy dependent and ventilator dependent at night.  The patient is presenting with 3 falls in the last 3 days. Lives at home with his daughter. He was recently hospitalized in September with similar situation and at that point was recommended to be discharged at [facility] but he decided to go home instead and works with home health and home physical therapy. He has been working with home health and home physical therapy throughout the month of October without any significant improvement. Last week he started having recurrent fall in the fall he describes are due to generalized weakness of bilateral lower legs. Patient denies any focal deficit head injury and neck injury a pleasant episode. Patient denies any chest pain or tachycardia. Patient mentions he is taking all his medications regularly and there is no recent change in his medication. Patient also denies any complaint of fever, chills, nausea, vomiting, abdominal pain, diarrhea, burning urination.  The patient is coming from home And at his baseline independent for most of his ADL.   HPI/Subjective: 11/26 A/O 4, sitting comfortably in chair, states there was a problem yesterday with transportation to SNF, and understands that he will be going on Monday to [place] SNF.   Assessment/Plan: Generalized weakness /  deconditioned state -Recurring problem, agrees to [place] that PATIENT NOT BE READMITTED FOR FALL, unless evidence of acute treatable disease process. -Ambulated around ward with walker yesterday without weakness.  Blood cultures 1 of 2 with coag negative species - contaminant and does not require antibiotic treatment   CKD stage III  -baseline creatinine 1.3-2  - Cr at baseline    Chronic diastolic heart failure  -EF 50-55% via TTE April 2015, w/ grade 1 DD  - Admission weight 158.5 kg, ; base weight ~160.6 kg, 11/26 weight = 155.9 kg -continue strict in and out; since admission-11.8 L -Daily a.m. Weight -Patient currently at his base weight will continue Lasix 60 mg daily (home dose)  Morbid obesity - Body mass index is 43.52 kg/(m^2).   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  -CBG reasonably controlled at present  - 9/10 hemoglobin A1c = 7.3  HTN - uncontrolled  -Continue amlodipine 10 mg daily  -Continue Coreg 6.25 mg BID  -Continue clonidine 0.1 mg BID  -Continue Lasix 60 mg daily -Continue losartan 50 mg daily   HLD  -Continue Zocor 20 mg daily   Severe B knee pain  -Chronic, no change from previous admission  - PT/OT; recommends SNF   - pain controlwith oxycodone IR 10 mg PO every 4 hours PRN moderate pain  Code Status: FULL  Family Communication: no family present at time of exam  Disposition Plan: Patient has agreed to SNF in [place]      Consultants: NA  Procedure/Significant Events: 4/12 echocardiogram;- Left ventricle: mildly dilated. mild LVH.-  LVEF= 50% to 55%.  -(grade 1 diastolic dysfunction).   Culture   NA  Antibiotics: NA  DVT prophylaxis: Coumadin  Devices Chronic trach present  LINES / TUBES:     Continuous Infusions:   Objective: VITAL SIGNS: Temp: 97.9 F (36.6 C) (11/26 0437) Temp Source: Oral (11/26 0437) BP: 119/79 mmHg (11/26 0437) Pulse Rate: 54 (11/26 0437) SPO2; FIO2:   Intake/Output Summary (Last 24 hours) at 12/11/13 1937 Last data filed at 12/11/13 0430  Gross per 24 hour  Intake    460 ml  Output   2300 ml  Net  -1840 ml     Exam: General: A/O 4, NAD, sitting in chair on room air, No acute respiratory distress, tracheostomy in place negative sign of infection. Lungs: Clear to auscultation bilaterally without wheezes or crackles Cardiovascular: Regular rate and rhythm without murmur gallop or rub normal S1 and S2 Abdomen: Morbidly obese, Nontender, nondistended, soft, bowel sounds positive, no rebound, no ascites, no appreciable mass Extremities: Bilateral lower extremity venous stasis ulcers which are healing well (eschar over both), No significant cyanosis, clubbing, or edema bilateral lower extremities  Data Reviewed: Basic Metabolic Panel:  Recent Labs Lab 12/06/13 0448 12/11/13 0300  NA 136* 135*  K 4.0 4.5  CL 99 101  CO2 26 22  GLUCOSE 162* 119*  BUN 20 28*  CREATININE 1.58* 1.63*  CALCIUM 8.8 8.9   Liver Function Tests:  Recent Labs Lab 12/06/13 0448  AST 12  ALT 9  ALKPHOS 60  BILITOT <0.2*  PROT 6.3  ALBUMIN 2.6*   No results for input(s): LIPASE, AMYLASE in the last 168 hours. No results for input(s): AMMONIA in the last 168 hours. CBC:  Recent Labs Lab 12/07/13 0340 12/11/13 0300  WBC 4.3 4.9  HGB 10.3* 10.7*  HCT 31.8* 32.4*  MCV 83.5 83.5  PLT 188 178   Cardiac Enzymes: No results for input(s): CKTOTAL, CKMB, CKMBINDEX, TROPONINI in the last 168 hours. BNP (last 3 results)  Recent Labs  08/03/13 1600 09/24/13 1701  12/03/13 0121  PROBNP 844.9* 7.4 1519.0*   CBG:  Recent Labs Lab 12/09/13 2207 12/10/13 0732 12/10/13 1158 12/10/13 1615 12/10/13 2207  GLUCAP 168* 157* 122* 151* 105*    Recent Results (from the past 240 hour(s))  Urine culture     Status: None   Collection Time: 12/02/13  5:01 PM  Result Value Ref Range Status   Specimen Description URINE, CATHETERIZED  Final   Special Requests NONE  Final   Culture  Setup Time   Final    12/02/2013 20:00 Performed at [name] Performed at [facility]   Final   Culture NO GROWTH Performed at [facility]   Final   Report Status 12/03/2013 FINAL  Final  Culture, blood (routine x 2)     Status: None   Collection Time: 12/02/13  5:07 PM  Result Value Ref Range Status   Specimen Description BLOOD RIGHT FOREARM  Final   Special Requests BOTTLES DRAWN AEROBIC AND ANAEROBIC 10CC  Final   Culture  Setup Time   Final    12/03/2013 00:26 Performed at [place]   Final  STAPHYLOCOCCUS SPECIES (COAGULASE NEGATIVE) Note: THE SIGNIFICANCE OF ISOLATING THIS ORGANISM FROM A SINGLE SET OF BLOOD CULTURES WHEN MULTIPLE SETS ARE DRAWN IS UNCERTAIN. PLEASE NOTIFY THE MICROBIOLOGY DEPARTMENT WITHIN ONE WEEK IF SPECIATION AND SENSITIVITIES ARE REQUIRED. Note: Gram Stain Report Called to,Read Back By and Verified With: MICHELLE MCMILLAN ON 12/03/2013 AT 9:22P BY Dennard Nip Performed at [facility]    Report Status 12/06/2013 FINAL  Final  Culture, blood (routine x 2)     Status: None   Collection Time: 12/02/13  6:11 PM  Result Value Ref Range Status   Specimen Description BLOOD LEFT HAND  Final   Special Requests   Final    BOTTLES DRAWN AEROBIC AND ANAEROBIC 5CC AER,2CC ANA   Culture  Setup Time   Final    12/03/2013 00:47 Performed at [facility]    Culture   Final    NO GROWTH 5 DAYS Performed at [facility]    Report Status 12/09/2013  FINAL  Final  Culture, respiratory (NON-Expectorated)     Status: None   Collection Time: 12/02/13  9:15 PM  Result Value Ref Range Status   Specimen Description SPUTUM  Final   Special Requests NONE  Final   Gram Stain   Final    MODERATE WBC PRESENT, PREDOMINANTLY PMN MODERATE SQUAMOUS EPITHELIAL CELLS PRESENT FEW GRAM POSITIVE COCCI IN PAIRS IN CHAINS FEW GRAM POSITIVE RODS FEW GRAM NEGATIVE COCCI    Culture   Final    NORMAL OROPHARYNGEAL FLORA Performed at [facility]    Report Status 12/05/2013 FINAL  Final  MRSA PCR Screening     Status: None   Collection Time: 12/03/13 12:16 AM  Result Value Ref Range Status   MRSA by PCR NEGATIVE NEGATIVE Final    Comment:        The GeneXpert MRSA Assay (FDA approved for NASAL specimens only), is one component of a comprehensive MRSA colonization surveillance program. It is not intended to diagnose MRSA infection nor to guide or monitor treatment for MRSA infections.      Studies:  Recent x-ray studies have been reviewed in detail by the Attending Physician  Scheduled Meds:  Scheduled Meds: . amLODipine  10 mg Oral Daily  . antiseptic oral rinse  7 mL Mouth Rinse QID  . carvedilol  6.25 mg Oral BID WC  . chlorhexidine  15 mL Mouth Rinse BID  . citalopram  20 mg Oral Daily  . cloNIDine  0.1 mg Oral BID  . furosemide  60 mg Oral Daily  . insulin aspart  0-15 Units Subcutaneous TID WC  . insulin aspart  0-5 Units Subcutaneous QHS  . insulin glargine  28 Units Subcutaneous q morning - 10a  . losartan  50 mg Oral Daily  . metoCLOPramide  5 mg Oral TID  . polyethylene glycol  17 g Oral Daily  . potassium chloride SA  40 mEq Oral Daily  . senna-docusate  1 tablet Oral BID  . simvastatin  20 mg Oral q1800  . sodium chloride  3 mL Intravenous Q12H  . Warfarin - Pharmacist Dosing Inpatient   Does not apply q1800    Time spent on care of this patient: 40 mins   Allie Bossier , MD   [facility] Office  [phone] Pager [phone]  On-Call/Text Page:      [email or web address]      password [email or web address]  If 7PM-7AM, please contact night-coverage [email or web address] Password [facility] 12/11/2013, 6:52 AM  LOS: 9 days

## 2013-12-12 LAB — PROTIME-INR
INR: 2.56 — ABNORMAL HIGH (ref 0.00–1.49)
Prothrombin Time: 27.7 seconds — ABNORMAL HIGH (ref 11.6–15.2)

## 2013-12-12 LAB — GLUCOSE, CAPILLARY
GLUCOSE-CAPILLARY: 124 mg/dL — AB (ref 70–99)
GLUCOSE-CAPILLARY: 140 mg/dL — AB (ref 70–99)
GLUCOSE-CAPILLARY: 121 mg/dL — AB (ref 70–99)
GLUCOSE-CAPILLARY: 122 mg/dL — AB (ref 70–99)

## 2013-12-12 MED ORDER — WARFARIN SODIUM 10 MG PO TABS
10.0000 mg | ORAL_TABLET | Freq: Once | ORAL | Status: AC
  Administered 2013-12-12: 10 mg via ORAL
  Filled 2013-12-12 (×2): qty 1

## 2013-12-12 NOTE — Progress Notes (Signed)
   Name: Travis Chapman MRN: [id number] DOB: 10-Jun-1945    ADMISSION DATE:  12/02/2013 CONSULTATION DATE:  12/02/2013  REFERRING MD :  TRH  CHIEF COMPLAINT:  Mechanical fall, chronic respiratory failure s/p tracheostomy  BRIEF PATIENT DESCRIPTION: 33 yobm with morbid obesity complicated by OHS with chronic trach (diurnal ATC, noctural vent) for chronic resp failure brought to [facility] ED 11/17 after he suffered a few mechanical falls while at home.  Fully asymptomatic; however, admitted overnight by [facility] for observation.  PCCM consulted for vent management.  SIGNIFICANT EVENTS  11/17  Admitted after multiple falls 11/23  Awake, alert, appears to be at baseline.    STUDIES:  CT head 11/17 >>> negative  SUBJECTIVE: Less than pleased about news that he has to go to SNF but understands the reasoning.  VITAL SIGNS: Temp:  [98 F (36.7 C)-98.9 F (37.2 C)] 98.2 F (36.8 C) (11/27 0728) Pulse Rate:  [54-360] 57 (11/27 0739) Resp:  [14-24] 14 (11/27 0739) BP: (111-153)/(63-86) 136/86 mmHg (11/27 0739) SpO2:  [96 %-100 %] 100 % (11/27 0728) FiO2 (%):  [40 %] 40 % (11/27 0728) Weight:  [156.6 kg (345 lb 3.9 oz)] 156.6 kg (345 lb 3.9 oz) (11/27 0424)  PHYSICAL EXAMINATION: General: Morbidly obese male, up to chair, in NAD. Neuro: A&O x 3, non-focal.  HEENT: [place]/AT. PERRL, sclerae anicteric. chronic tracheostomy. Cardiovascular: RRR, no M/R/G appreciated. Lungs: Respirations even and unlabored.  CTA bilaterally, No W/R/R.  Chronic trach. Abdomen: Obese, BS x 4, soft, NT/ND.  Musculoskeletal: Chronic venous stasis changes bilaterally, no edema.  Skin: Intact, warm, no rashes.  Recent Labs Lab 12/06/13 0448 12/11/13 0300  NA 136* 135*  K 4.0 4.5  CL 99 101  CO2 26 22  BUN 20 28*  CREATININE 1.58* 1.63*  GLUCOSE 162* 119*   Recent Labs Lab 12/07/13 0340 12/11/13 0300  HGB 10.3* 10.7*  HCT 31.8* 32.4*  WBC 4.3 4.9  PLT 188 178   No results found.   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 Chronic respiratory failure due to OSA / OSH Hx PE - on coumadin, INR subtherapeutic on admission  Recs: Continue nocturnal vent support (per last discharge summary, prior settings were PRVC, Rate 14, VT 600, PEEP 5). Continue diurnal ATC, wean O2 for saturations of 88-92% Pulmonary hygiene. Continue coumadin per pharmacy. PRN bronchodilators. Push mobilization as able  Generalized deconditioning  Recs: PT / OT consult in AM. Needs SNF rehab where he can do vent qhs as this is his 5th admit in last 6 months  Maintain nocturnal vent, ATC during the day and continue bronchodilators, no needs for further weaning efforts.  Needs placement pending [facility] application.  Rush Farmer, M.D. [facility] Pulmonary/Critical Care Medicine. Pager: [phone]. After hours pager: [phone].

## 2013-12-12 NOTE — Progress Notes (Signed)
Physical Therapy Treatment Patient Details Name: Travis Chapman MRN: [id number] DOB: 1945-08-27 Today's Date: 12/12/2013    History of Present Illness Pt adm with weakness, deconditioning and incr falls. PMHx-Long term trach - on vent at night. Morbid obesity, DM, CHF, severe arthritis of knees with frequent falls and 7 admissions since 2/15 for falls    PT Comments    Pt progressing with mobility, was able to get to EOB with supervision, ambulated 100' with min-guard A, still experiencing bilateral knee pain with ambulation but no buckling of knees. VSS. PT will continue to follow.   Follow Up Recommendations  SNF     Equipment Recommendations  None recommended by PT    Recommendations for Other Services       Precautions / Restrictions Precautions Precautions: Fall Precaution Comments: trach, vent at night Restrictions Weight Bearing Restrictions: No    Mobility  Bed Mobility Overal bed mobility: Needs Assistance Bed Mobility: Supine to Sit     Supine to sit: Supervision     General bed mobility comments: pt able to rise from flat bed using momentum and bed rail, mild posterior LOB during transition but pt steadied self with rail  Transfers Overall transfer level: Needs assistance Equipment used: Rolling walker (2 wheeled) Transfers: Sit to/from Stand Sit to Stand: Min guard;+2 safety/equipment         General transfer comment: elevated bed, momentum used, min-guard during stand, +2 for safety due to pt size  Ambulation/Gait Ambulation/Gait assistance: Min guard;+2 safety/equipment Ambulation Distance (Feet): 100 Feet Assistive device: Rolling walker (2 wheeled) Gait Pattern/deviations: Step-through pattern;Wide base of support;Trunk flexed Gait velocity: decreased   General Gait Details: pt ambulates with bilateral knees maintained in nearly full extension, wide BOS, tolerated increased distance today but reported increasing knee pain with distance. No  buckling. HR began at 86 bpm, increased to 106 during ambulation, O2 sats maintained at 96% on RA   Stairs            Wheelchair Mobility    Modified Rankin (Stroke Patients Only)       Balance Overall balance assessment: Needs assistance Sitting-balance support: Feet supported;No upper extremity supported Sitting balance-Leahy Scale: Fair Sitting balance - Comments: occasionally loses balance with wt-shifting or perturbation   Standing balance support: Bilateral upper extremity supported;During functional activity Standing balance-Leahy Scale: Poor Standing balance comment: maintained standing x5 mins for washing lower body before ambulation, able to hold RW with one UE for short period of time and maintain balance but fatigues more quickly without bilateral UE support                    Cognition Arousal/Alertness: Awake/alert Behavior During Therapy: WFL for tasks assessed/performed Overall Cognitive Status: Within Functional Limits for tasks assessed                      Exercises General Exercises - Lower Extremity Ankle Circles/Pumps: AROM;Both;10 reps;Supine;Seated Quad Sets: AROM;Both;10 reps;Seated Gluteal Sets: AROM;Both;10 reps;Seated    General Comments        Pertinent Vitals/Pain Pain Assessment: Faces Faces Pain Scale: Hurts little more Pain Location: bilateral knees with ambulation Pain Descriptors / Indicators: Aching Pain Intervention(s): Patient requesting pain meds-RN notified  HR with ambulation 86-106 bpm O2 sats 96% on RA    Home Living                      Prior Function  PT Goals (current goals can now be found in the care plan section) Acute Rehab PT Goals Patient Stated Goal: to walk without walker PT Goal Formulation: With patient Time For Goal Achievement: 12/17/13 Potential to Achieve Goals: Fair Progress towards PT goals: Progressing toward goals    Frequency  Min 2X/week    PT  Plan Frequency needs to be updated    Co-evaluation             End of Session Equipment Utilized During Treatment: Gait belt Activity Tolerance: Patient tolerated treatment well Patient left: in chair;with call bell/phone within reach     Time: 0813-0843 PT Time Calculation (min) (ACUTE ONLY): 30 min  Charges:  $Gait Training: 23-37 mins                    G Codes:     Leighton Roach, PT  Acute Rehab Services  [place], [place] 12/12/2013, 9:00 AM

## 2013-12-12 NOTE — Plan of Care (Signed)
Problem: Problem: Mobility Progression Goal: ABLE TO AMBULATE INDEPENDENTLY Outcome: Progressing

## 2013-12-12 NOTE — Clinical Social Work Note (Signed)
CSW left a voice message for the [facility] worker Bearl Mulberry at [phone] to confirm his meeting with the pt today to complete the [facility] application with the pt. CSW will continue to follow and assist with this process.   [place], MSW, [place]

## 2013-12-12 NOTE — Progress Notes (Signed)
[place] TEAM 1 - Stepdown/ICU TEAM  [name] JJH:[id number] DOB: 1945-12-05 DOA: 12/02/2013 PCP: Renato Shin, MD  Brief narrative:   68 y.o. male with history of obesity hypoventilation syndrome status post tracheostomy on nightly vent, diabetes mellitus with diabetic neuropathy, chronic diastolic heart failure, hypertension, physical deconditioning, and chronic anemia who presented with 3 falls in 3 days. He has been living at home with his daughter.   He was hospitalized in September with similar complaints and at that point was recommended to be discharged to an LTAC but he decided to go home instead and work with home health and home physical therapy. He has been doing so throughout the month of October without any significant improvement. He then started having recurrent falls due to generalized weakness of both legs.  HPI/Subjective: No new complaints today.  Alert and conversant.     Assessment/Plan:    Generalized weakness / deconditioned state / functional quadriplegia - pt has had PT evaluation which recommended SNF placement; pt with frequent recurrent falls - Vent SNF at discharge w/ plans for facility in [place] as soon as paperwork goes through (anticipate early next week) - SW assisting   Blood cultures 1 of 2 with coag negative species - contaminant and does not require antibiotic treatment   CKD stage III  - baseline creatinine 1.3 - 2  - crt is stable at baseline  Chronic systolic and  diastolic heart failure  - EF 50-55% on TTE April 2015, w/ grade 1 diastolic dysfunction - strict in and out; fluid balance -13L thus far  - weight since admission 158.58 kg --> 156 kg; continue daily weight  - continue coreg, lasix, losartan  History of pulmonary embolism - continue coumadin dosing per pharmacy  - no reports of bleeding  Chronic hypercarbic resp failure / OHS / Sleep apnea s/p trach / chronic nocturnal vent support  - Vent support per PCCM,  overnight only - ATC during day  - respiratory status is stable   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  - Y1E on 5/63/14 was 7.3 - CBG reasonably controlled presently  - continue Lantus along with SSI - Reglan for gastroparesis  Essential hypertension - Bp reasonably well controlled at this time - no change in tx plan today   HLD  - Continue Zocor  Severe knee pain bilaterally  - Chronic, no change from previous admission  - PT/OT recommends SNF   - pain controlwith oxycodone IR 10 mg PO every 4 hours PRN moderate pain   Constipation - continue senna, miralax  DVT Prophylaxis  coumadin   Code Status: DNR/DNI Family Communication:  Family not at the bedside  Disposition Plan: to SNF when bed available (early next week)   Procedures and diagnostic studies:    none  Medical Consultants:  PCCM  Anti-Infectives:   none  Objective: Filed Vitals:   12/12/13 0728 12/12/13 0739 12/12/13 1125 12/12/13 1140  BP: 136/86 136/86 118/72 118/72  Pulse: 58 57 59 60  Temp: 98.2 F (36.8 C)  98.1 F (36.7 C)   TempSrc: Oral  Oral   Resp: 15 14 21 20   Height:      Weight:      SpO2: 100%  95%     Intake/Output Summary (Last 24 hours) at 12/12/13 1525 Last data filed at 12/12/13 1006  Gross per 24 hour  Intake      3 ml  Output   1725 ml  Net  -1722 ml  Exam: General: No acute respiratory distress - alert  Lungs: Clear to auscultation bilaterally without wheezes or crackles - distant bs due to obesity  Cardiovascular: Regular rate and rhythm without murmur gallop or rub - distant HS  Abdomen: Nontender, morbidly obese, soft, bowel sounds positive, no rebound, no ascites, no appreciable mass Extremities: No significant cyanosis, clubbing;  1+ edema bilateral lower extremities  Data Reviewed: Basic Metabolic Panel:  Recent Labs Lab 12/06/13 0448 12/11/13 0300  NA 136* 135*  K 4.0 4.5  CL 99 101  CO2 26 22  GLUCOSE 162* 119*  BUN 20 28*    CREATININE 1.58* 1.63*  CALCIUM 8.8 8.9   Liver Function Tests:  Recent Labs Lab 12/06/13 0448  AST 12  ALT 9  ALKPHOS 60  BILITOT <0.2*  PROT 6.3  ALBUMIN 2.6*   CBC:  Recent Labs Lab 12/07/13 0340 12/11/13 0300  WBC 4.3 4.9  HGB 10.3* 10.7*  HCT 31.8* 32.4*  MCV 83.5 83.5  PLT 188 178   CBG:  Recent Labs Lab 12/11/13 1204 12/11/13 1710 12/11/13 2141 12/12/13 0736 12/12/13 1124  GLUCAP 143* 196* 142* 124* 140*    Urine culture     Status: None   Collection Time: 12/02/13  5:01 PM  Result Value Ref Range Status   Specimen Description URINE, CATHETERIZED  Final   Special Requests NONE  Final   Culture  Setup Time   Final   Colony Count NO GROWTH  Final   Report Status 12/03/2013 FINAL  Final  Culture, blood (routine x 2)     Status: None   Collection Time: 12/02/13  5:07 PM  Result Value Ref Range Status   Specimen Description BLOOD RIGHT FOREARM  Final   Special Requests BOTTLES DRAWN AEROBIC AND ANAEROBIC 10CC  Final   Culture  Setup Time   Final   Culture   Final    STAPHYLOCOCCUS SPECIES (COAGULASE NEGATIVE)   Report Status 12/06/2013 FINAL  Final  Culture, blood (routine x 2)     Status: None (Preliminary result)   Collection Time: 12/02/13  6:11 PM  Result Value Ref Range Status   Specimen Description BLOOD LEFT HAND  Final   Special Requests   Final    BOTTLES DRAWN AEROBIC AND ANAEROBIC 5CC AER,2CC ANA   Culture  Setup Time   Final   Culture   Final           BLOOD CULTURE RECEIVED NO GROWTH TO DATE    Report Status PENDING  Incomplete  Culture, respiratory (NON-Expectorated)     Status: None   Collection Time: 12/02/13  9:15 PM  Result Value Ref Range Status   Specimen Description SPUTUM  Final   Special Requests NONE  Final   Gram Stain   Final   Culture   Final    NORMAL OROPHARYNGEAL FLORA   Report Status 12/05/2013 FINAL  Final  MRSA PCR Screening     Status: None   Collection Time: 12/03/13 12:16 AM  Result Value Ref Range  Status   MRSA by PCR NEGATIVE NEGATIVE Final    Comment:           Scheduled Meds: . amLODipine  10 mg Oral Daily  . carvedilol  6.25 mg Oral BID WC  . citalopram  20 mg Oral Daily  . cloNIDine  0.1 mg Oral BID  . docusate sodium  100 mg Oral BID  . furosemide  60 mg Oral Daily  . insulin aspart  0-15 Units Subcutaneous TID WC  . insulin aspart  0-5 Units Subcutaneous QHS  . insulin glargine  28 Units Subcutaneous q morning - 10a  . losartan  50 mg Oral Daily  . metoCLOPramide  5 mg Oral TID  . polyethylene glycol  17 g Oral Daily  . potassium chloride SA  40 mEq Oral Daily  . senna-docusate  1 tablet Oral BID  . simvastatin  20 mg Oral q1800  . Warfarin -   Does not apply B2010   Cherene Altes, MD [facility] For Consults/Admissions - Flow Manager - [phone] Office  [phone] Pager [phone]  On-Call/Text Page:      [email or web address]      password [facility]  12/12/2013 3:25 PM

## 2013-12-12 NOTE — Progress Notes (Signed)
ANTICOAGULATION CONSULT NOTE - Follow Up Consult  Pharmacy Consult for Coumadin Indication: h/o PE  No Known Allergies  Patient Measurements: Height: 6\' 2"  (188 cm) Weight: (!) 345 lb 3.9 oz (156.6 kg) IBW/kg (Calculated) : 82.2  Vital Signs: Temp: 98.2 F (36.8 C) (11/27 0728) Temp Source: Oral (11/27 0728) BP: 136/86 mmHg (11/27 0739) Pulse Rate: 57 (11/27 0739)  Labs:  Recent Labs  12/10/13 0311 12/11/13 0300 12/12/13 0245  HGB  --  10.7*  --   HCT  --  32.4*  --   PLT  --  178  --   LABPROT 24.5* 23.7* 27.7*  INR 2.18* 2.09* 2.56*  CREATININE  --  1.63*  --     Estimated Creatinine Clearance: 69.7 mL/min (by C-G formula based on Cr of 1.63).   Assessment: 68 yo F admitted on 12/02/2013 with generalized weakness and fall - 3 falls in the past 3 days. Patient is on Warfarin PTA for hx PE ('12). Dopplers neg for new DVT this admit. INR 1.62 on admit on  5 mg daily EXCEPT for 10 mg on MWF. INR remains therapeutic today at 2.56. No new CBC today and no bleeding noted.   Goal of Therapy:  INR 2-3 Monitor platelets by anticoagulation protocol: Yes   Plan:  1. Warfarin 10mg  PO x 1 tonight 2. F/u AM INR  Salome Arnt, PharmD, BCPS Pager # [phone] 12/12/2013 10:20 AM

## 2013-12-13 LAB — GLUCOSE, CAPILLARY
GLUCOSE-CAPILLARY: 130 mg/dL — AB (ref 70–99)
Glucose-Capillary: 121 mg/dL — ABNORMAL HIGH (ref 70–99)
Glucose-Capillary: 174 mg/dL — ABNORMAL HIGH (ref 70–99)
Glucose-Capillary: 124 mg/dL — ABNORMAL HIGH (ref 70–99)

## 2013-12-13 LAB — PROTIME-INR
INR: 2.71 — ABNORMAL HIGH (ref 0.00–1.49)
Prothrombin Time: 29 seconds — ABNORMAL HIGH (ref 11.6–15.2)

## 2013-12-13 MED ORDER — WARFARIN SODIUM 10 MG PO TABS
10.0000 mg | ORAL_TABLET | Freq: Once | ORAL | Status: AC
  Administered 2013-12-13: 10 mg via ORAL
  Filled 2013-12-13: qty 1

## 2013-12-13 NOTE — Progress Notes (Signed)
ANTICOAGULATION CONSULT NOTE - Follow Up Consult  Pharmacy Consult for Coumadin Indication: h/o PE  No Known Allergies  Patient Measurements: Height: 6\' 2"  (188 cm) Weight: (!) 343 lb 14.7 oz (156 kg) IBW/kg (Calculated) : 82.2  Vital Signs: Temp: 98.3 F (36.8 C) (11/28 0402) Temp Source: Oral (11/28 0402) BP: 117/83 mmHg (11/28 0402) Pulse Rate: 59 (11/28 0402)  Labs:  Recent Labs  12/11/13 0300 12/12/13 0245 12/13/13 0217  HGB 10.7*  --   --   HCT 32.4*  --   --   PLT 178  --   --   LABPROT 23.7* 27.7* 29.0*  INR 2.09* 2.56* 2.71*  CREATININE 1.63*  --   --     Estimated Creatinine Clearance: 69.5 mL/min (by C-G formula based on Cr of 1.63).   Assessment: 68 yo F admitted on 12/02/2013 with generalized weakness and fall - 3 falls in the past 3 days. Patient is on Warfarin PTA for hx PE ('12). Dopplers neg for new DVT this admit. INR 1.62 on admit on  5 mg daily EXCEPT for 10 mg on MWF. INR therapeutic again. Will dose day by day for now.   Goal of Therapy:  INR 2-3 Monitor platelets by anticoagulation protocol: Yes   Plan:   Coumadin 10mg  PO x1 Daily INR  Onnie Boer, PharmD Pager: [phone] 12/13/2013 7:08 AM

## 2013-12-13 NOTE — Progress Notes (Signed)
TEAM 1 - Stepdown/ICU TEAM  Travis Chapman [id number]:[id number] DOB: 1945/10/02 DOA: 12/02/2013 PCP: Renato Shin, MD  Brief narrative:   68 y.o. male with history of obesity hypoventilation syndrome status post tracheostomy on nightly vent, diabetes mellitus with diabetic neuropathy, chronic diastolic heart failure, hypertension, physical deconditioning, and chronic anemia who presented with 3 falls in 3 days. He has been living at home with his daughter.   He was hospitalized in September with similar complaints and at that point was recommended to be discharged to an LTAC but he decided to go home instead and work with home health and home physical therapy. He has been doing so throughout the month of October without any significant improvement. He then started having recurrent falls due to generalized weakness of both legs.  HPI/Subjective: Resting comfortably.  No new issues.    Assessment/Plan:    Generalized weakness / deconditioned state / functional quadriplegia -pt has had PT evaluation which recommended SNF placement; pt with frequent recurrent falls -Vent SNF at discharge w/ plans for facility in [place] as soon as paperwork goes through (anticipate early next week) -SW assisting   Blood cultures 1 of 2 with coag negative species -contaminant and does not require antibiotic treatment   CKD stage III  -baseline creatinine 1.3 - 2  -crt is stable at baseline  Chronic systolic and  diastolic heart failure  -EF 50-55% on TTE April 2015, w/ grade 1 diastolic dysfunction -strict in and out; fluid balance -15L thus far  -continue coreg, lasix, losartan  History of pulmonary embolism -continue coumadin dosing per pharmacy  -no reports of bleeding  Chronic hypercarbic resp failure / OHS / Sleep apnea s/p trach / chronic nocturnal vent support  -Vent support per PCCM, overnight only - ATC during day  -respiratory status is stable   DM w/ neurologic complications  and GI complications  -E0C on 1/44/81 was 7.3 - CBG reasonably controlled presently  -continue Lantus along with SSI -Reglan for gastroparesis  Essential hypertension -BP well controlled at this time - no change in tx plan today   HLD  -Continue Zocor  Severe knee pain bilaterally  -Chronic, no change from previous admission  -PT/OT recommends SNF   -pain controlwith oxycodone IR 10 mg PO every 4 hours PRN moderate pain   Constipation -continue senna, miralax  DVT Prophylaxis  coumadin   Code Status: DNR/DNI Family Communication:  Family not at the bedside  Disposition Plan: to SNF when bed available (early next week)   Procedures and diagnostic studies:    none  Medical Consultants:  PCCM  Anti-Infectives:   none  Objective: Filed Vitals:   12/13/13 0737 12/13/13 0756 12/13/13 1140 12/13/13 1145  BP:  119/76 120/61 120/61  Pulse: 64 66 59 65  Temp:  98.7 F (37.1 C) 98.2 F (36.8 C)   TempSrc:  Oral Oral   Resp: 16 18 20 16   Height:      Weight:      SpO2: 94% 97%  96%    Intake/Output Summary (Last 24 hours) at 12/13/13 1520 Last data filed at 12/13/13 1143  Gross per 24 hour  Intake    303 ml  Output   1950 ml  Net  -1647 ml   Exam: General: No acute respiratory distress Lungs: Clear to auscultation bilaterally without wheezes or crackles - distant bs due to obesity  Cardiovascular: Regular rate and rhythm without murmur gallop or rub - distant HS  Abdomen: Nontender, morbidly obese,  soft, bowel sounds positive, no rebound, no ascites, no appreciable mass Extremities: No significant cyanosis, clubbing;  trace edema bilateral lower extremities  Data Reviewed: Basic Metabolic Panel:  Recent Labs Lab 12/11/13 0300  NA 135*  K 4.5  CL 101  CO2 22  GLUCOSE 119*  BUN 28*  CREATININE 1.63*  CALCIUM 8.9   Liver Function Tests: No results for input(s): AST, ALT, ALKPHOS, BILITOT, PROT, ALBUMIN in the last 168 hours. CBC:  Recent  Labs Lab 12/07/13 0340 12/11/13 0300  WBC 4.3 4.9  HGB 10.3* 10.7*  HCT 31.8* 32.4*  MCV 83.5 83.5  PLT 188 178   CBG:  Recent Labs Lab 12/12/13 1124 12/12/13 1632 12/12/13 2127 12/13/13 0755 12/13/13 1142  GLUCAP 140* 121* 122* 130* 121*    Urine culture     Status: None   Collection Time: 12/02/13  5:01 PM  Result Value Ref Range Status   Specimen Description URINE, CATHETERIZED  Final   Special Requests NONE  Final   Culture  Setup Time   Final   Colony Count NO GROWTH  Final   Report Status 12/03/2013 FINAL  Final  Culture, blood (routine x 2)     Status: None   Collection Time: 12/02/13  5:07 PM  Result Value Ref Range Status   Specimen Description BLOOD RIGHT FOREARM  Final   Special Requests BOTTLES DRAWN AEROBIC AND ANAEROBIC 10CC  Final   Culture  Setup Time   Final   Culture   Final    STAPHYLOCOCCUS SPECIES (COAGULASE NEGATIVE)   Report Status 12/06/2013 FINAL  Final  Culture, blood (routine x 2)     Status: None (Preliminary result)   Collection Time: 12/02/13  6:11 PM  Result Value Ref Range Status   Specimen Description BLOOD LEFT HAND  Final   Special Requests   Final    BOTTLES DRAWN AEROBIC AND ANAEROBIC 5CC AER,2CC ANA   Culture  Setup Time   Final   Culture   Final           BLOOD CULTURE RECEIVED NO GROWTH TO DATE    Report Status PENDING  Incomplete  Culture, respiratory (NON-Expectorated)     Status: None   Collection Time: 12/02/13  9:15 PM  Result Value Ref Range Status   Specimen Description SPUTUM  Final   Special Requests NONE  Final   Gram Stain   Final   Culture   Final    NORMAL OROPHARYNGEAL FLORA   Report Status 12/05/2013 FINAL  Final  MRSA PCR Screening     Status: None   Collection Time: 12/03/13 12:16 AM  Result Value Ref Range Status   MRSA by PCR NEGATIVE NEGATIVE Final    Comment:           Scheduled Meds: . amLODipine  10 mg Oral Daily  . carvedilol  6.25 mg Oral BID WC  . citalopram  20 mg Oral Daily  .  cloNIDine  0.1 mg Oral BID  . docusate sodium  100 mg Oral BID  . furosemide  60 mg Oral Daily  . insulin aspart  0-15 Units Subcutaneous TID WC  . insulin aspart  0-5 Units Subcutaneous QHS  . insulin glargine  28 Units Subcutaneous q morning - 10a  . losartan  50 mg Oral Daily  . metoCLOPramide  5 mg Oral TID  . polyethylene glycol  17 g Oral Daily  . potassium chloride SA  40 mEq Oral Daily  . senna-docusate  1 tablet Oral BID  . simvastatin  20 mg Oral q1800  . Warfarin -   Does not apply G3875   Cherene Altes, MD [facility] For Consults/Admissions - Flow Manager - [phone] Office  [phone] Pager [phone]  On-Call/Text Page:      [email or web address]      password [facility]  12/13/2013 3:20 PM

## 2013-12-14 LAB — BASIC METABOLIC PANEL
Anion gap: 12 (ref 5–15)
BUN: 31 mg/dL — AB (ref 6–23)
CO2: 24 mEq/L (ref 19–32)
CREATININE: 1.59 mg/dL — AB (ref 0.50–1.35)
Calcium: 8.9 mg/dL (ref 8.4–10.5)
Chloride: 102 mEq/L (ref 96–112)
GFR, EST AFRICAN AMERICAN: 50 mL/min — AB (ref >90)
GFR, EST NON AFRICAN AMERICAN: 43 mL/min — AB (ref >90)
GLUCOSE: 154 mg/dL — AB (ref 70–99)
POTASSIUM: 4.5 meq/L (ref 3.7–5.3)
Sodium: 138 mEq/L (ref 137–147)

## 2013-12-14 LAB — GLUCOSE, CAPILLARY
GLUCOSE-CAPILLARY: 166 mg/dL — AB (ref 70–99)
GLUCOSE-CAPILLARY: 224 mg/dL — AB (ref 70–99)
Glucose-Capillary: 146 mg/dL — ABNORMAL HIGH (ref 70–99)
Glucose-Capillary: 110 mg/dL — ABNORMAL HIGH (ref 70–99)

## 2013-12-14 LAB — CBC
HCT: 32.2 % — ABNORMAL LOW (ref 39.0–52.0)
Hemoglobin: 10.5 g/dL — ABNORMAL LOW (ref 13.0–17.0)
MCH: 26.9 pg (ref 26.0–34.0)
MCHC: 32.6 g/dL (ref 30.0–36.0)
MCV: 82.6 fL (ref 78.0–100.0)
Platelets: 158 10*3/uL (ref 150–400)
RBC: 3.9 MIL/uL — AB (ref 4.22–5.81)
RDW: 12.8 % (ref 11.5–15.5)
WBC: 5 10*3/uL (ref 4.0–10.5)

## 2013-12-14 LAB — PROTIME-INR
INR: 2.65 — ABNORMAL HIGH (ref 0.00–1.49)
PROTHROMBIN TIME: 28.5 s — AB (ref 11.6–15.2)

## 2013-12-14 MED ORDER — WARFARIN SODIUM 10 MG PO TABS
10.0000 mg | ORAL_TABLET | Freq: Every day | ORAL | Status: DC
  Administered 2013-12-14 – 2013-12-16 (×3): 10 mg via ORAL
  Filled 2013-12-14 (×4): qty 1

## 2013-12-14 NOTE — Progress Notes (Signed)
[employer] TEAM 1 - Stepdown/ICU TEAM  [name] GGE:[id number] DOB: 09/27/1945 DOA: 12/02/2013 PCP: Renato Shin, MD  Brief narrative:   68 y.o. male with history of obesity hypoventilation syndrome status post tracheostomy on nightly vent, diabetes mellitus with diabetic neuropathy, chronic diastolic heart failure, hypertension, physical deconditioning, and chronic anemia who presented with 3 falls in 3 days. He has been living at home with his daughter.   He was hospitalized in September with similar complaints and at that point was recommended to be discharged to an LTAC but he decided to go home instead and work with home health and home physical therapy. He has been doing so throughout the month of October without any significant improvement. He then started having recurrent falls due to generalized weakness of both legs.  HPI/Subjective: Resting comfortably.  No complaints apart from boredom.     Assessment/Plan:    Generalized weakness / deconditioned state / functional quadriplegia -pt has had PT evaluation which recommended SNF placement; pt with frequent recurrent falls -Vent SNF at discharge w/ plans for facility in [place] as soon as paperwork goes through (anticipate early next week) -SW assisting   Blood cultures 1 of 2 with coag negative species -contaminant and does not require antibiotic treatment   CKD stage III  -baseline creatinine 1.3 - 2  -crt is stable at baseline  Chronic systolic and diastolic heart failure  -EF 50-55% on TTE April 2015, w/ grade 1 diastolic dysfunction -strict in and out; fluid balance -16.5L thus far  -continue coreg, lasix, losartan  History of pulmonary embolism -continue coumadin dosing per pharmacy  -no reports of bleeding  Chronic hypercarbic resp failure / OHS / Sleep apnea s/p trach / chronic nocturnal vent support  -Vent support per PCCM, overnight only - ATC during day  -respiratory status is stable   DM w/  neurologic complications and GI complications  -Y6T on 0/35/46 was 7.3 - CBG reasonably controlled presently  -continue Lantus along with SSI -Reglan for gastroparesis  Essential hypertension -BP well controlled at this time - no change in tx plan today   HLD  -Continue Zocor  Severe knee pain bilaterally  -Chronic, no change from previous admission  -PT/OT recommends SNF   -pain controlwith oxycodone IR 10 mg PO every 4 hours PRN moderate pain   Constipation -continue senna, miralax  DVT Prophylaxis  coumadin   Code Status: DNR/DNI Family Communication:  Family not at the bedside  Disposition Plan: to SNF when bed available (early this week)   Procedures and diagnostic studies:    none   Medical Consultants:  PCCM  Anti-Infectives:   none  Objective: Filed Vitals:   12/14/13 0338 12/14/13 0735 12/14/13 1153 12/14/13 1231  BP: 115/72 124/85 113/67   Pulse: 58 57 58   Temp: 98.4 F (36.9 C) 98.3 F (36.8 C) 97.8 F (36.6 C)   TempSrc: Oral Oral Oral   Resp: 15 15 17    Height:      Weight: 155.5 kg (342 lb 13 oz)     SpO2: 100% 100% 96% 100%    Intake/Output Summary (Last 24 hours) at 12/14/13 1401 Last data filed at 12/14/13 1159  Gross per 24 hour  Intake    153 ml  Output   1875 ml  Net  -1722 ml   Exam: General: No acute respiratory distress Lungs: Clear to auscultation bilaterally without wheezes or crackles - distant bs   Cardiovascular: Regular rate and rhythm without murmur gallop or  rub - distant HS  Abdomen: Nontender, morbidly obese, soft, bowel sounds positive, no rebound, no ascites, no appreciable mass Extremities: No significant cyanosis, clubbing;  trace edema bilateral lower extremities  Data Reviewed: Basic Metabolic Panel:  Recent Labs Lab 12/11/13 0300 12/14/13 0235  NA 135* 138  K 4.5 4.5  CL 101 102  CO2 22 24  GLUCOSE 119* 154*  BUN 28* 31*  CREATININE 1.63* 1.59*  CALCIUM 8.9 8.9   Liver Function Tests: No  results for input(s): AST, ALT, ALKPHOS, BILITOT, PROT, ALBUMIN in the last 168 hours.   CBC:  Recent Labs Lab 12/11/13 0300 12/14/13 0235  WBC 4.9 5.0  HGB 10.7* 10.5*  HCT 32.4* 32.2*  MCV 83.5 82.6  PLT 178 158   CBG:  Recent Labs Lab 12/13/13 1142 12/13/13 1620 12/13/13 2201 12/14/13 0738 12/14/13 1158  GLUCAP 121* 174* 124* 146* 110*    Urine culture     Status: None   Collection Time: 12/02/13  5:01 PM  Result Value Ref Range Status   Specimen Description URINE, CATHETERIZED  Final   Special Requests NONE  Final   Culture  Setup Time   Final   Colony Count NO GROWTH  Final   Report Status 12/03/2013 FINAL  Final  Culture, blood (routine x 2)     Status: None   Collection Time: 12/02/13  5:07 PM  Result Value Ref Range Status   Specimen Description BLOOD RIGHT FOREARM  Final   Special Requests BOTTLES DRAWN AEROBIC AND ANAEROBIC 10CC  Final   Culture  Setup Time   Final   Culture   Final    STAPHYLOCOCCUS SPECIES (COAGULASE NEGATIVE)   Report Status 12/06/2013 FINAL  Final  Culture, blood (routine x 2)     Status: None (Preliminary result)   Collection Time: 12/02/13  6:11 PM  Result Value Ref Range Status   Specimen Description BLOOD LEFT HAND  Final   Special Requests   Final    BOTTLES DRAWN AEROBIC AND ANAEROBIC 5CC AER,2CC ANA   Culture  Setup Time   Final   Culture   Final           BLOOD CULTURE RECEIVED NO GROWTH TO DATE    Report Status PENDING  Incomplete  Culture, respiratory (NON-Expectorated)     Status: None   Collection Time: 12/02/13  9:15 PM  Result Value Ref Range Status   Specimen Description SPUTUM  Final   Special Requests NONE  Final   Gram Stain   Final   Culture   Final    NORMAL OROPHARYNGEAL FLORA   Report Status 12/05/2013 FINAL  Final  MRSA PCR Screening     Status: None   Collection Time: 12/03/13 12:16 AM  Result Value Ref Range Status   MRSA by PCR NEGATIVE NEGATIVE Final    Comment:           Scheduled Meds: .  amLODipine  10 mg Oral Daily  . carvedilol  6.25 mg Oral BID WC  . citalopram  20 mg Oral Daily  . cloNIDine  0.1 mg Oral BID  . docusate sodium  100 mg Oral BID  . furosemide  60 mg Oral Daily  . insulin aspart  0-15 Units Subcutaneous TID WC  . insulin aspart  0-5 Units Subcutaneous QHS  . insulin glargine  28 Units Subcutaneous q morning - 10a  . losartan  50 mg Oral Daily  . metoCLOPramide  5 mg Oral TID  .  polyethylene glycol  17 g Oral Daily  . potassium chloride SA  40 mEq Oral Daily  . senna-docusate  1 tablet Oral BID  . simvastatin  20 mg Oral q1800  . Warfarin -   Does not apply V4715   Cherene Altes, MD [facility] For Consults/Admissions - Flow Manager - [phone] Office  [phone] Pager [phone]  On-Call/Text Page:      [email or web address]      password [facility]  12/14/2013 2:01 PM

## 2013-12-14 NOTE — Plan of Care (Signed)
Problem: Problem: Mobility Progression Goal: IMPROVED WEIGHT-BEARING STATUS Outcome: Progressing Goal: ABLE TO AMBULATE INDEPENDENTLY Outcome: Progressing Goal: INCREASED STRENGTH-LOWER EXTREMITY Outcome: Progressing

## 2013-12-14 NOTE — Progress Notes (Signed)
ANTICOAGULATION CONSULT NOTE - Follow Up Consult  Pharmacy Consult for Coumadin Indication: h/o PE  No Known Allergies  Patient Measurements: Height: 6\' 2"  (188 cm) Weight: (!) 342 lb 13 oz (155.5 kg) IBW/kg (Calculated) : 82.2  Vital Signs: Temp: 98.4 F (36.9 C) (11/29 0338) Temp Source: Oral (11/29 0338) BP: 115/72 mmHg (11/29 0338) Pulse Rate: 58 (11/29 0338)  Labs:  Recent Labs  12/12/13 0245 12/13/13 0217 12/14/13 0235  HGB  --   --  10.5*  HCT  --   --  32.2*  PLT  --   --  158  LABPROT 27.7* 29.0* 28.5*  INR 2.56* 2.71* 2.65*  CREATININE  --   --  1.59*    Estimated Creatinine Clearance: 71.1 mL/min (by C-G formula based on Cr of 1.59).   Assessment: 68 yo F admitted on 12/02/2013 with generalized weakness and fall - 3 falls in the past 3 days. Patient is on Warfarin PTA for hx PE ('12). Dopplers neg for new DVT this admit. INR 1.62 on admit on  5 mg daily EXCEPT for 10 mg on MWF. INR therapeutic again. We'll try a schedule dose for now.    Goal of Therapy:  INR 2-3 Monitor platelets by anticoagulation protocol: Yes   Plan:   Coumadin 10mg  PO qday Daily INR  Onnie Boer, PharmD Pager: [phone] 12/14/2013 7:18 AM

## 2013-12-14 NOTE — Clinical Social Work Note (Signed)
CSW left a voice message for the [facility] worker Bearl Mulberry at [phone] on this date. CSW will continue to follow tomorrow.   [place], [place] Weekend Clinical Social Worker [phone]

## 2013-12-15 LAB — PROTIME-INR
INR: 2.87 — ABNORMAL HIGH (ref 0.00–1.49)
PROTHROMBIN TIME: 30.3 s — AB (ref 11.6–15.2)

## 2013-12-15 LAB — GLUCOSE, CAPILLARY
GLUCOSE-CAPILLARY: 104 mg/dL — AB (ref 70–99)
Glucose-Capillary: 128 mg/dL — ABNORMAL HIGH (ref 70–99)
Glucose-Capillary: 146 mg/dL — ABNORMAL HIGH (ref 70–99)

## 2013-12-15 NOTE — Progress Notes (Signed)
ANTICOAGULATION CONSULT NOTE - Follow Up Consult  Pharmacy Consult for Coumadin Indication: h/o PE  No Known Allergies  Patient Measurements: Height: 6\' 2"  (188 cm) Weight: (!) 341 lb 14.9 oz (155.1 kg) IBW/kg (Calculated) : 82.2  Vital Signs: Temp: 98 F (36.7 C) (11/30 0754) Temp Source: Oral (11/30 0754) BP: 138/62 mmHg (11/30 0907) Pulse Rate: 59 (11/30 0722)  Labs:  Recent Labs  12/13/13 0217 12/14/13 0235 12/15/13 0242  HGB  --  10.5*  --   HCT  --  32.2*  --   PLT  --  158  --   LABPROT 29.0* 28.5* 30.3*  INR 2.71* 2.65* 2.87*  CREATININE  --  1.59*  --     Estimated Creatinine Clearance: 71 mL/min (by C-G formula based on Cr of 1.59).   Assessment: 68 yo M admitted on 12/02/2013 with generalized weakness and fall - 3 falls in the past 3 days. Patient is on Warfarin PTA for hx PE ('12). Dopplers neg for new DVT this admit. INR 1.62 on admit on  5 mg daily EXCEPT for 10 mg on MWF. INR therapeutic x 7 days and trending up on 10 mg daily. INR 2.87 today.  No bleeding reported.  Goal of Therapy:  INR 2-3   Plan:  Coumadin 10mg  PO qday Daily INR  Eudelia Bunch, Pharm.D. [phone] 12/15/2013 9:30 AM

## 2013-12-15 NOTE — Progress Notes (Signed)
   Name: Travis Chapman MRN: [id number] DOB: 04-13-1945    ADMISSION DATE:  12/02/2013 CONSULTATION DATE:  12/02/2013  REFERRING MD :  TRH  CHIEF COMPLAINT:  Mechanical fall, chronic respiratory failure s/p tracheostomy  BRIEF PATIENT DESCRIPTION: 50 yobm with morbid obesity complicated by OHS with chronic trach (diurnal ATC, noctural vent) for chronic resp failure brought to [facility] ED 11/17 after he suffered a few mechanical falls while at home.  Fully asymptomatic; however, admitted overnight by [facility] for observation.  PCCM consulted for vent management.  SIGNIFICANT EVENTS  11/17  Admitted after multiple falls 11/23  Awake, alert, appears to be at baseline.    STUDIES:  CT head 11/17 >>> negative  SUBJECTIVE: No complaints. Currently trach is open to RA. States he will not use PMV. Tolerating well.   VITAL SIGNS: Temp:  [97.8 F (36.6 C)-98.3 F (36.8 C)] 98 F (36.7 C) (11/30 0754) Pulse Rate:  [55-67] 59 (11/30 0722) Resp:  [15-22] 17 (11/30 0722) BP: (110-160)/(62-90) 138/62 mmHg (11/30 0907) SpO2:  [95 %-100 %] 97 % (11/30 0722) FiO2 (%):  [21 %-40 %] 21 % (11/30 0722) Weight:  [155.1 kg (341 lb 14.9 oz)] 155.1 kg (341 lb 14.9 oz) (11/30 0352)  PHYSICAL EXAMINATION: General: Morbidly obese male, up to chair, in NAD. Neuro: A&O x 3, non-focal.  HEENT: [place]/AT. PERRL, sclerae anicteric. chronic tracheostomy. Cardiovascular: RRR, no M/R/G appreciated. Lungs: Respirations even and unlabored.  CTA bilaterally, No W/R/R.  Chronic trach. Abdomen: Obese, BS x 4, soft, NT/ND.  Musculoskeletal: Chronic venous stasis changes bilaterally, no edema.  Skin: Intact, warm, no rashes.  Recent Labs Lab 12/11/13 0300 12/14/13 0235  NA 135* 138  K 4.5 4.5  CL 101 102  CO2 22 24  BUN 28* 31*  CREATININE 1.63* 1.59*  GLUCOSE 119* 154*    Recent Labs Lab 12/11/13 0300 12/14/13 0235  HGB 10.7* 10.5*  HCT 32.4* 32.2*  WBC 4.9 5.0  PLT 178 158   No results found.   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 Chronic respiratory failure due to OSA / OSH Hx PE - on coumadin, INR subtherapeutic on admission  Recs: Continue nocturnal vent support (per last discharge summary, prior settings were PRVC, Rate 14, VT 600, PEEP 5). Continue diurnal ATC, wean O2 for saturations of 88-92%. Currently 94% on RA Pulmonary hygiene. Continue coumadin per pharmacy. PRN bronchodilators. Push mobilization as able  Generalized deconditioning  Recs: PT / OT  Needs SNF rehab where he can do vent qhs as this is his 5th admit in last 6 months  Maintain nocturnal vent, ATC during the day and continue bronchodilators, no needs for further weaning efforts.  Needs placement pending [facility] application.    PCCM will follow intermittently. Please call if assistance is needed.    Georgann Housekeeper, ACNP [facility] Pager [phone] or [phone]  Continue ATC during the day and vent at night, awaiting placement at this point.  PCCM will follow intermittently.  Patient seen and examined, agree with above note.  I dictated the care and orders written for this patient under my direction.  Rush Farmer, MD [phone]

## 2013-12-15 NOTE — Progress Notes (Signed)
Physical Therapy Treatment Patient Details Name: Travis Chapman MRN: [id number] DOB: 1945-01-22 Today's Date: 12/15/2013    History of Present Illness Pt adm with weakness, deconditioning and incr falls. PMHx-Long term trach - on vent at night. Morbid obesity, DM, CHF, severe arthritis of knees with frequent falls and 7 admissions since 2/15 for falls    PT Comments    Progressing slowly dependent on pain level each day.  Feel more strong with ability to transfer with less assist as well as ability to mobilize twice in one session.  Feel LTACH rehab will enhance safety, stability and independence.  Follow Up Recommendations  SNF     Equipment Recommendations  None recommended by PT    Recommendations for Other Services       Precautions / Restrictions Precautions Precautions: Fall Precaution Comments: trach, vent at night    Mobility  Bed Mobility               General bed mobility comments: sitting in recliner  Transfers Overall transfer level: Needs assistance Equipment used: Rolling walker (2 wheeled) Transfers: Sit to/from Stand Sit to Stand: Min guard         General transfer comment: performed twice, slow and painful  Ambulation/Gait Ambulation/Gait assistance: Min guard Ambulation Distance (Feet): 45 Feet (x 2) Assistive device: Rolling walker (2 wheeled)       General Gait Details: slow with heavy UE assist on walker and flexed knees, flexed trunk   Stairs            Wheelchair Mobility    Modified Rankin (Stroke Patients Only)       Balance Overall balance assessment: Needs assistance           Standing balance-Leahy Scale: Poor Standing balance comment: assist with UE's for balance due to LE weakness and pain                    Cognition Arousal/Alertness: Awake/alert Behavior During Therapy: WFL for tasks assessed/performed Overall Cognitive Status: Within Functional Limits for tasks assessed                      Exercises General Exercises - Lower Extremity Ankle Circles/Pumps: AROM;Both;20 reps;Seated Long Arc Quad: AROM;Both;10 reps;Strengthening;Seated (w/3 sec hold) Hip ABduction/ADduction: Strengthening;Both;15 reps;Seated (hip IR and ADD isometrics with pillow b/w knees and 3 sec hold) Straight Leg Raises: AROM;AAROM;Both;10 reps;Seated    General Comments        Pertinent Vitals/Pain Faces Pain Scale: Hurts whole lot Pain Location: knees with transfers and ambulation Pain Descriptors / Indicators: Aching Pain Intervention(s): Patient requesting pain meds-RN notified    Home Living                      Prior Function            PT Goals (current goals can now be found in the care plan section) Progress towards PT goals: Progressing toward goals (slowly)    Frequency  Min 2X/week    PT Plan Current plan remains appropriate    Co-evaluation             End of Session Equipment Utilized During Treatment: Gait belt Activity Tolerance: Patient limited by pain Patient left: with call bell/phone within reach     Time: 1415-1438 PT Time Calculation (min) (ACUTE ONLY): 23 min  Charges:  $Gait Training: 8-22 mins $Therapeutic Exercise: 8-22 mins  G Codes:      [name],[name] 12/15/2013, 2:54 PM [name], [name] 12/15/2013

## 2013-12-15 NOTE — Clinical Social Work Note (Addendum)
CSW spoke with [place] Medicaid worker- Medicaid application is being processed.  [facility] will be able to accept patient on Wednesday (12/17/13).  CSW spoke with [employer]- will be able to transport patient on 12/17/13 at 11:00am.  CSW will continue to follow.  Domenica Reamer, [place] Social Worker [phone]

## 2013-12-15 NOTE — Progress Notes (Signed)
[facility] TEAM 1 - Stepdown/ICU TEAM  [name] DJS:[id number] DOB: May 12, 1945 DOA: 12/02/2013 PCP: Renato Shin, MD  Brief narrative:   68 y.o. male with history of obesity hypoventilation syndrome status post tracheostomy on nightly vent, diabetes mellitus with diabetic neuropathy, chronic diastolic heart failure, hypertension, physical deconditioning, and chronic anemia who presented with 3 falls in 3 days. He has been living at home with his daughter.   He was hospitalized in September with similar complaints and at that point was recommended to be discharged to an LTAC but he decided to go home instead and work with home health and home physical therapy. He has been doing so throughout the month of October without any significant improvement. He then started having recurrent falls due to generalized weakness of both legs.  HPI/Subjective: Resting comfortably.  Has been working w/ PT and OT today.  No news on SNF placement thus far today.    Assessment/Plan:    Generalized weakness / deconditioned state / functional quadriplegia -pt has had PT evaluation which recommended SNF placement; pt with frequent recurrent falls -Vent SNF at discharge w/ plans for facility in [place] as soon as paperwork goes through (anticipate early next week) -SW assisting   Blood cultures 1 of 2 with coag negative species -most c/w contaminant - does not require antibiotic treatment   CKD stage III  -baseline creatinine 1.3 - 2  -crt is stable at baseline  Chronic systolic and diastolic heart failure  -EF 50-55% on TTE April 2015, w/ grade 1 diastolic dysfunction -strict in and out; fluid balance -17L thus far  -continue coreg, lasix, losartan  History of pulmonary embolism -continue coumadin dosing per pharmacy  -no reports of bleeding  Chronic hypercarbic resp failure / OHS / Sleep apnea s/p trach / chronic nocturnal vent support  -Vent support per PCCM, overnight only - ATC during  day  -respiratory status is stable   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  -Y8F on 0/27/74 was 7.3 - CBG reasonably controlled presently  -continue Lantus along with SSI -Reglan for gastroparesis  Essential hypertension -BP well controlled at this time - no change in tx plan today   HLD  -Continue Zocor  Severe knee pain bilaterally  -Chronic, no change from previous admission  -PT/OT recommends SNF   -pain controlwith oxycodone IR 10 mg PO every 4 hours PRN moderate pain   Constipation -continue senna, miralax  DVT Prophylaxis  coumadin   Code Status: DNR/DNI Family Communication:  Spoke w/ girlfriend at bedside   Disposition Plan: to SNF when bed available (early this week) - medically ready for d/c    Procedures and diagnostic studies:    none   Medical Consultants:  PCCM  Anti-Infectives:   none  Objective: Filed Vitals:   12/15/13 0754 12/15/13 0907 12/15/13 1127 12/15/13 1220  BP: 125/65 138/62  113/64  Pulse:   60 57  Temp: 98 F (36.7 C)   97.9 F (36.6 C)  TempSrc: Oral   Oral  Resp:   19 20  Height:      Weight:      SpO2:   100%     Intake/Output Summary (Last 24 hours) at 12/15/13 1523 Last data filed at 12/15/13 0657  Gross per 24 hour  Intake    120 ml  Output   1400 ml  Net  -1280 ml   Exam: General: No acute respiratory distress Lungs: Clear to auscultation bilaterally without wheezes/crackles - distant bs  Cardiovascular: Regular rate and rhythm without murmur gallop or rub - distant HS  Abdomen: Nontender, morbidly obese, soft, bowel sounds positive, no rebound, no ascites, no appreciable mass Extremities: No significant cyanosis, clubbing;  trace edema bilateral lower extremities  Data Reviewed: Basic Metabolic Panel:  Recent Labs Lab 12/11/13 0300 12/14/13 0235  NA 135* 138  K 4.5 4.5  CL 101 102  CO2 22 24  GLUCOSE 119* 154*  BUN 28* 31*  CREATININE 1.63* 1.59*  CALCIUM 8.9 8.9   Liver  Function Tests: No results for input(s): AST, ALT, ALKPHOS, BILITOT, PROT, ALBUMIN in the last 168 hours.   CBC:  Recent Labs Lab 12/11/13 0300 12/14/13 0235  WBC 4.9 5.0  HGB 10.7* 10.5*  HCT 32.4* 32.2*  MCV 83.5 82.6  PLT 178 158   CBG:  Recent Labs Lab 12/14/13 1158 12/14/13 1559 12/14/13 2008 12/15/13 0756 12/15/13 1218  GLUCAP 110* 166* 224* 104* 128*    Urine culture     Status: None   Collection Time: 12/02/13  5:01 PM  Result Value Ref Range Status   Specimen Description URINE, CATHETERIZED  Final   Special Requests NONE  Final   Culture  Setup Time   Final   Colony Count NO GROWTH  Final   Report Status 12/03/2013 FINAL  Final  Culture, blood (routine x 2)     Status: None   Collection Time: 12/02/13  5:07 PM  Result Value Ref Range Status   Specimen Description BLOOD RIGHT FOREARM  Final   Special Requests BOTTLES DRAWN AEROBIC AND ANAEROBIC 10CC  Final   Culture  Setup Time   Final   Culture   Final    STAPHYLOCOCCUS SPECIES (COAGULASE NEGATIVE)   Report Status 12/06/2013 FINAL  Final  Culture, blood (routine x 2)     Status: None (Preliminary result)   Collection Time: 12/02/13  6:11 PM  Result Value Ref Range Status   Specimen Description BLOOD LEFT HAND  Final   Special Requests   Final    BOTTLES DRAWN AEROBIC AND ANAEROBIC 5CC AER,2CC ANA   Culture  Setup Time   Final   Culture   Final           BLOOD CULTURE RECEIVED NO GROWTH TO DATE    Report Status PENDING  Incomplete  Culture, respiratory (NON-Expectorated)     Status: None   Collection Time: 12/02/13  9:15 PM  Result Value Ref Range Status   Specimen Description SPUTUM  Final   Special Requests NONE  Final   Gram Stain   Final   Culture   Final    NORMAL OROPHARYNGEAL FLORA   Report Status 12/05/2013 FINAL  Final  MRSA PCR Screening     Status: None   Collection Time: 12/03/13 12:16 AM  Result Value Ref Range Status   MRSA by PCR NEGATIVE NEGATIVE Final    Comment:            Scheduled Meds: . amLODipine  10 mg Oral Daily  . carvedilol  6.25 mg Oral BID WC  . citalopram  20 mg Oral Daily  . cloNIDine  0.1 mg Oral BID  . docusate sodium  100 mg Oral BID  . furosemide  60 mg Oral Daily  . insulin aspart  0-15 Units Subcutaneous TID WC  . insulin aspart  0-5 Units Subcutaneous QHS  . insulin glargine  28 Units Subcutaneous q morning - 10a  . losartan  50 mg Oral Daily  .  metoCLOPramide  5 mg Oral TID  . polyethylene glycol  17 g Oral Daily  . potassium chloride SA  40 mEq Oral Daily  . senna-docusate  1 tablet Oral BID  . simvastatin  20 mg Oral q1800  . Warfarin -   Does not apply E3953   Cherene Altes, MD [facility] For Consults/Admissions - Flow Manager - [phone] Office  [phone] Pager [phone]  On-Call/Text Page:      [email or web address]      password [facility]  12/15/2013 3:23 PM

## 2013-12-15 NOTE — Progress Notes (Signed)
Occupational Therapy Treatment Patient Details Name: Travis Chapman MRN: [id number] DOB: Feb 28, 1945 Today's Date: 12/15/2013    History of present illness Pt adm with weakness, deconditioning and incr falls. PMHx-Long term trach - on vent at night. Morbid obesity, DM, CHF, severe arthritis of knees with frequent falls and 7 admissions since 2/15 for falls   OT comments  Pt demonstrates improving activity tolerance needed for increased independence with BADLs.   Follow Up Recommendations  SNF;Supervision/Assistance - 24 hour    Equipment Recommendations  None recommended by OT    Recommendations for Other Services      Precautions / Restrictions Precautions Precautions: Fall Precaution Comments: trach, vent at night       Mobility Bed Mobility               General bed mobility comments: sitting in recliner  Transfers Overall transfer level: Needs assistance Equipment used: Rolling walker (2 wheeled) Transfers: Sit to/from Stand Sit to Stand: Min guard         General transfer comment: performed twice, slow and painful    Balance Overall balance assessment: Needs assistance           Standing balance-Leahy Scale: Poor Standing balance comment: assist with UE's for balance due to LE weakness and pain                   ADL       Grooming: Wash/dry hands;Wash/dry face;Oral care;Min guard;Standing                               Functional mobility during ADLs: Minimal assistance;+2 for safety/equipment General ADL Comments: Pt able to tolerate standing x 8 mins without rest breaks during simulated ADLs.  Pt fatigued afterwards.  Denied knee pain       Vision                     Perception     Praxis      Cognition   Behavior During Therapy: WFL for tasks assessed/performed Overall Cognitive Status: Within Functional Limits for tasks assessed                       Extremity/Trunk Assessment                Exercises General Exercises - Lower Extremity Ankle Circles/Pumps: AROM;Both;20 reps;Seated Long Arc Quad: AROM;Both;10 reps;Strengthening;Seated (w/3 sec hold) Hip ABduction/ADduction: Strengthening;Both;15 reps;Seated (hip IR and ADD isometrics with pillow b/w knees and 3 sec hold) Straight Leg Raises: AROM;AAROM;Both;10 reps;Seated   Shoulder Instructions       General Comments      Pertinent Vitals/ Pain       Pain Assessment: No/denies pain Faces Pain Scale: Hurts whole lot Pain Location: knees with transfers and ambulation Pain Descriptors / Indicators: Aching Pain Intervention(s): Patient requesting pain meds-RN notified  Home Living                                          Prior Functioning/Environment              Frequency Min 2X/week     Progress Toward Goals  OT Goals(current goals can now be found in the care plan section)  Progress towards OT goals: Progressing toward goals  Acute Rehab OT  Goals Potential to Achieve Goals: Good ADL Goals Pt Will Perform Lower Body Bathing: sitting/lateral leans;bed level;with min assist Pt Will Perform Lower Body Dressing: sitting/lateral leans;bed level;with min assist Pt Will Transfer to Toilet: with mod assist;stand pivot transfer;bedside commode Pt Will Perform Toileting - Clothing Manipulation and hygiene: with min assist;sitting/lateral leans;bed level  Plan Discharge plan remains appropriate    Co-evaluation                 End of Session Equipment Utilized During Treatment: Rolling walker   Activity Tolerance Patient tolerated treatment well   Patient Left in chair;with call bell/phone within reach;with family/visitor present   Nurse Communication Mobility status        Time: 1441-1453 OT Time Calculation (min): 12 min  Charges: OT General Charges $OT Visit: 1 Procedure OT Treatments $Therapeutic Activity: 8-22 mins  Travis Chapman M 12/15/2013, 3:54 PM

## 2013-12-15 NOTE — Plan of Care (Signed)
Problem: Consults Goal: General Medical Patient Education See Patient Education Module for specific education. Outcome: Completed/Met Date Met:  12/15/13 Goal: Diabetes Guidelines if Diabetic/Glucose > 140 If diabetic or lab glucose is > 140 mg/dl - Initiate Diabetes/Hyperglycemia Guidelines & Document Interventions  Outcome: Completed/Met Date Met:  12/15/13  Problem: Phase I Progression Outcomes Goal: Pain controlled with appropriate interventions Outcome: Completed/Met Date Met:  12/15/13 Goal: OOB as tolerated unless otherwise ordered Outcome: Completed/Met Date Met:  12/15/13 Goal: Initial discharge plan identified Outcome: Completed/Met Date Met:  12/15/13 Goal: Voiding-avoid urinary catheter unless indicated Outcome: Completed/Met Date Met:  12/15/13 Goal: Hemodynamically stable Outcome: Completed/Met Date Met:  12/15/13 Goal: Other Phase I Outcomes/Goals Outcome: Completed/Met Date Met:  12/15/13  Problem: Phase II Progression Outcomes Goal: Progress activity as tolerated unless otherwise ordered Outcome: Completed/Met Date Met:  12/15/13 Goal: Discharge plan established Outcome: Completed/Met Date Met:  12/15/13 Goal: Vital signs remain stable Outcome: Completed/Met Date Met:  12/15/13 Goal: IV changed to normal saline lock Outcome: Completed/Met Date Met:  12/15/13 Goal: Obtain order to discontinue catheter if appropriate Outcome: Not Applicable Date Met:  37/62/83

## 2013-12-16 LAB — GLUCOSE, CAPILLARY
GLUCOSE-CAPILLARY: 117 mg/dL — AB (ref 70–99)
GLUCOSE-CAPILLARY: 141 mg/dL — AB (ref 70–99)
GLUCOSE-CAPILLARY: 111 mg/dL — AB (ref 70–99)
GLUCOSE-CAPILLARY: 154 mg/dL — AB (ref 70–99)
Glucose-Capillary: 127 mg/dL — ABNORMAL HIGH (ref 70–99)

## 2013-12-16 LAB — PROTIME-INR
INR: 2.33 — ABNORMAL HIGH (ref 0.00–1.49)
Prothrombin Time: 25.8 seconds — ABNORMAL HIGH (ref 11.6–15.2)

## 2013-12-16 NOTE — Plan of Care (Signed)
Problem: Phase III Progression Outcomes Goal: Pain controlled on oral analgesia Outcome: Completed/Met Date Met:  12/16/13 Goal: Activity at appropriate level-compared to baseline (UP IN CHAIR FOR HEMODIALYSIS)  Outcome: Completed/Met Date Met:  12/16/13 Goal: IV/normal saline lock discontinued Outcome: Completed/Met Date Met:  12/16/13 Goal: Foley discontinued Outcome: Not Applicable Date Met:  61/53/79 Goal: Discharge plan remains appropriate-arrangements made Outcome: Completed/Met Date Met:  12/16/13

## 2013-12-16 NOTE — Progress Notes (Signed)
[facility] TEAM 1 - Stepdown/ICU TEAM Progress Note  Travis Chapman MCN:[id number] DOB: 1945-03-31 DOA: 12/02/2013 PCP: Renato Shin, MD  Admit HPI / Brief Narrative: 68 y.o. 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 On previous discharge patient was to be placed in SNF.  Patient has complex hx of obesity hypoventilation syndrome with chronic respiratory failure tracheostomy dependent and ventilator dependent at night.  The patient is presenting with 3 falls in the last 3 days. Lives at home with his daughter. He was recently hospitalized in September with similar situation and at that point was recommended to be discharged at [facility] but he decided to go home instead and works with home health and home physical therapy. He has been working with home health and home physical therapy throughout the month of October without any significant improvement. Last week he started having recurrent fall in the fall he describes are due to generalized weakness of bilateral lower legs. Patient denies any focal deficit head injury and neck injury a pleasant episode. Patient denies any chest pain or tachycardia. Patient mentions he is taking all his medications regularly and there is no recent change in his medication. Patient also denies any complaint of fever, chills, nausea, vomiting, abdominal pain, diarrhea, burning urination.  The patient is coming from home And at his baseline independent for most of his ADL.   HPI/Subjective: 12/1 A/O 4, sitting comfortably in chair, states there was still a problem with transportation to SNF, and understands that he will be going tomorrow to [place].   Assessment/Plan: Generalized weakness /  deconditioned state -Recurring problem, agrees to [place] that PATIENT NOT BE READMITTED FOR FALL, unless evidence of acute treatable disease process. -Ambulated around ward with walker yesterday without weakness.  Blood cultures 1 of 2 with coag negative species - contaminant and does not require antibiotic treatment   CKD stage III  -baseline creatinine 1.3-2  - Cr at baseline    Chronic diastolic heart failure  -EF 50-55% via TTE April 2015, w/ grade 1 DD  - Admission weight 158.5 kg, ; base weight ~160.6 kg, 12/1 weight = 155.8 kg -continue strict in and out; since admission-19.6 L -Daily a.m. Weight -Patient currently at his base weight will continue Lasix 60 mg daily (home dose)  Morbid obesity - Body mass index is 43.52 kg/(m^2).   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  -CBG reasonably controlled at present  - 9/10 hemoglobin A1c = 7.3  HTN - uncontrolled  -Continue amlodipine 10 mg daily  -Continue Coreg 6.25 mg BID  -Continue clonidine 0.1 mg BID  -Continue Lasix 60 mg daily -Continue losartan 50 mg daily   HLD  -Continue Zocor 20 mg daily   Severe B knee pain  -Chronic, no change from previous admission  - PT/OT; recommends SNF   - pain controlwith oxycodone IR 10 mg PO every 4 hours PRN moderate pain      Code  Status: FULL  Family Communication: no family present at time of exam  Disposition Plan: Patient has agreed to SNF in [place]      Consultants: NA  Procedure/Significant Events: 4/12 echocardiogram;- Left ventricle: mildly dilated. mild LVH.- LVEF=  50% to 55%.  -(grade 1 diastolic dysfunction).   Culture   NA  Antibiotics: NA  DVT prophylaxis: Coumadin  Devices Chronic trach present  LINES / TUBES:     Continuous Infusions:   Objective: VITAL SIGNS: Temp: 98.2 F (36.8 C) (12/01 0806) Temp Source: Oral (12/01 0806) BP: 134/72 mmHg (12/01 0845) Pulse Rate: 63 (12/01 0806) SPO2; FIO2:   Intake/Output Summary (Last 24 hours) at 12/16/13 7062 Last data filed at 12/16/13 0900  Gross per 24 hour  Intake    320 ml  Output   2200 ml  Net  -1880 ml     Exam: General: A/O 4, NAD, sitting in chair on room air, No acute respiratory distress, tracheostomy in place negative sign of infection. Lungs: Clear to auscultation bilaterally without wheezes or crackles Cardiovascular: Regular rate and rhythm without murmur gallop or rub normal S1 and S2 Abdomen: Morbidly obese, Nontender, nondistended, soft, bowel sounds positive, no rebound, no ascites, no appreciable mass Extremities: Bilateral lower extremity venous stasis ulcers which are healing well (eschar over both), No significant cyanosis, clubbing, or edema bilateral lower extremities  Data Reviewed: Basic Metabolic Panel:  Recent Labs Lab 12/11/13 0300 12/14/13 0235  NA 135* 138  K 4.5 4.5  CL 101 102  CO2 22 24  GLUCOSE 119* 154*  BUN 28* 31*  CREATININE 1.63* 1.59*  CALCIUM 8.9 8.9   Liver Function Tests: No results for input(s): AST, ALT, ALKPHOS, BILITOT, PROT, ALBUMIN in the last 168 hours. No results for input(s): LIPASE, AMYLASE in the last 168 hours. No results for input(s): AMMONIA in the last 168 hours. CBC:  Recent Labs Lab 12/11/13 0300 12/14/13 0235  WBC 4.9 5.0  HGB 10.7* 10.5*  HCT 32.4* 32.2*  MCV 83.5 82.6  PLT 178 158   Cardiac Enzymes: No results for input(s): CKTOTAL, CKMB, CKMBINDEX, TROPONINI in the last 168 hours. BNP (last 3 results)  Recent Labs  08/03/13 1600 09/24/13 1701 12/03/13 0121  PROBNP 844.9*  7.4 1519.0*   CBG:  Recent Labs Lab 12/15/13 0756 12/15/13 1218 12/15/13 1544 12/15/13 2201 12/16/13 0811  GLUCAP 104* 128* 146* 127* 117*    No results found for this or any previous visit (from the past 240 hour(s)).   Studies:  Recent x-ray studies have been reviewed in detail by the Attending Physician  Scheduled Meds:  Scheduled Meds: . amLODipine  10 mg Oral Daily  . antiseptic oral rinse  7 mL Mouth Rinse QID  . carvedilol  6.25 mg Oral BID WC  . chlorhexidine  15 mL Mouth Rinse BID  . citalopram  20 mg Oral Daily  . cloNIDine  0.1 mg Oral BID  . furosemide  60 mg Oral Daily  . insulin aspart  0-15 Units Subcutaneous TID WC  . insulin aspart  0-5 Units Subcutaneous QHS  . insulin glargine  28 Units Subcutaneous q morning - 10a  . losartan  50 mg Oral Daily  . metoCLOPramide  5 mg Oral TID  . polyethylene glycol  17 g Oral Daily  . potassium chloride SA  40 mEq Oral Daily  . senna-docusate  1 tablet Oral BID  . simvastatin  20 mg Oral q1800  . sodium chloride  3 mL Intravenous  Q12H  . warfarin  10 mg Oral q1800  . Warfarin - Pharmacist Dosing Inpatient   Does not apply q1800    Time spent on care of this patient: 40 mins   Allie Bossier , MD   [facility] Office  [phone] Pager [phone]  On-Call/Text Page:      [email or web address]      password [email or web address]  If 7PM-7AM, please contact night-coverage [email or web address] Password [email or web address] 12/16/2013, 9:22 AM   LOS: 14 days

## 2013-12-16 NOTE — Progress Notes (Signed)
ANTICOAGULATION CONSULT NOTE - Follow Up Consult  Pharmacy Consult for Coumadin Indication: h/o PE  No Known Allergies  Patient Measurements: Height: 6\' 2"  (188 cm) Weight: (!) 343 lb 7.6 oz (155.8 kg) IBW/kg (Calculated) : 82.2  Vital Signs: Temp: 98.2 F (36.8 C) (12/01 0806) Temp Source: Oral (12/01 0806) BP: 134/72 mmHg (12/01 0845) Pulse Rate: 63 (12/01 0806)  Labs:  Recent Labs  12/14/13 0235 12/15/13 0242 12/16/13 0218  HGB 10.5*  --   --   HCT 32.2*  --   --   PLT 158  --   --   LABPROT 28.5* 30.3* 25.8*  INR 2.65* 2.87* 2.33*  CREATININE 1.59*  --   --     Estimated Creatinine Clearance: 71.2 mL/min (by C-G formula based on Cr of 1.59).   Assessment:  68 yo M admitted on 12/02/2013 with generalized weakness and recurrent falls. Patient is on warfarin PTA for hx PE (2012). Dopplers neg for new DVT this admit. INR subtherapeutic on admission.  INR 2.33 today. No bleeding reported.  PTA warfarin dose: 10 mg po MWF, 5 mg all other days  Goal of Therapy:  INR 2-3   Plan:  Coumadin 10mg  PO daily Daily INR Will require higher warfarin dose at discharge then what he was previously taking   Hughes Better, PharmD, BCPS Clinical Pharmacist Pager: [phone] 12/16/2013 10:51 AM

## 2013-12-16 NOTE — Progress Notes (Signed)
   Name: Travis Chapman MRN: [id number] DOB: 04/01/1945    ADMISSION DATE:  12/02/2013 CONSULTATION DATE:  12/02/2013  REFERRING MD :  TRH  CHIEF COMPLAINT:  Mechanical fall, chronic respiratory failure s/p tracheostomy  BRIEF PATIENT DESCRIPTION: 35 yobm with morbid obesity complicated by OHS with chronic trach (diurnal ATC, noctural vent) for chronic resp failure brought to [facility] ED 11/17 after he suffered a few mechanical falls while at home.  Fully asymptomatic; however, admitted overnight by [facility] for observation.  PCCM consulted for vent management.  SIGNIFICANT EVENTS  11/17  Admitted after multiple falls 11/23  Awake, alert, appears to be at baseline.    STUDIES:  CT head 11/17 >>> negative  SUBJECTIVE: No complaints. Currently trach is open to RA. States he will not use PMV. Tolerating well.   VITAL SIGNS: Temp:  [97 F (36.1 C)-98.5 F (36.9 C)] 98.3 F (36.8 C) (12/01 1204) Pulse Rate:  [57-72] 57 (12/01 1259) Resp:  [14-20] 20 (12/01 1259) BP: (103-142)/(55-90) 103/59 mmHg (12/01 1259) SpO2:  [93 %-100 %] 96 % (12/01 1204) FiO2 (%):  [40 %] 40 % (12/01 0317) Weight:  [155.8 kg (343 lb 7.6 oz)] 155.8 kg (343 lb 7.6 oz) (12/01 0340)  PHYSICAL EXAMINATION: General: Morbidly obese male, in bed, in NAD. Neuro: A&O x 3, non-focal.  HEENT: [place]/AT. PERRL, sclerae anicteric. chronic tracheostomy. Cardiovascular: RRR, no M/R/G appreciated. Lungs: Respirations even and unlabored.  CTA bilaterally, No W/R/R.  Chronic trach. Abdomen: Obese, BS x 4, soft, NT/ND.  Musculoskeletal: Chronic venous stasis changes bilaterally, no edema.  Skin: Intact, warm, no rashes.  Recent Labs Lab 12/11/13 0300 12/14/13 0235  NA 135* 138  K 4.5 4.5  CL 101 102  CO2 22 24  BUN 28* 31*  CREATININE 1.63* 1.59*  GLUCOSE 119* 154*    Recent Labs Lab 12/11/13 0300 12/14/13 0235  HGB 10.7* 10.5*  HCT 32.4* 32.2*  WBC 4.9 5.0  PLT 178 158   No results found.   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 Chronic respiratory failure due to OSA / OSH Hx PE - on coumadin, INR subtherapeutic on admission  Recs: Continue nocturnal vent support (per last discharge summary, prior settings were PRVC, Rate 14, VT 600, PEEP 5). Continue diurnal ATC, wean O2 for saturations of 88-92%. Currently 94% on RA Pulmonary hygiene. Continue coumadin per pharmacy. PRN bronchodilators. Push mobilization as able  Generalized deconditioning  Recs: PT / OT  Needs SNF rehab where he can do vent qhs as this is his 5th admit in last 6 months  Maintain nocturnal vent, ATC during the day and continue bronchodilators, no needs for further weaning efforts.  Needs placement pending [facility] application.   Rush Farmer, M.D. [facility] Pulmonary/Critical Care Medicine. Pager: [phone]. After hours pager: [phone].

## 2013-12-16 NOTE — Clinical Social Work Note (Addendum)
CSW informed patient that transfer to [place] would be tomorrow (12/2) at 11am.  Patient concerned about getting belongings in time.    CSW spoke with patients daughter, [name], who stated she will bring patients clothes to the hospital and stated she is meeting with Randall Hiss the [facility] worker tonight at [place] to finalize some paperwork.  Patients daughter will also drive up to [place] with patient tomorrow and help him acquire everything he needs there.  CSW confirmed with [facility] that patient pick up is scheduled for tomorrow at 11am.  CSW will continue to follow.  Domenica Reamer, [place] Social Worker [phone]

## 2013-12-17 DIAGNOSIS — R0600 Dyspnea, unspecified: Secondary | ICD-10-CM

## 2013-12-17 LAB — PROTIME-INR
INR: 2.51 — ABNORMAL HIGH (ref 0.00–1.49)
Prothrombin Time: 27.3 seconds — ABNORMAL HIGH (ref 11.6–15.2)

## 2013-12-17 LAB — GLUCOSE, CAPILLARY
Glucose-Capillary: 108 mg/dL — ABNORMAL HIGH (ref 70–99)
Glucose-Capillary: 130 mg/dL — ABNORMAL HIGH (ref 70–99)

## 2013-12-17 MED ORDER — FUROSEMIDE 20 MG PO TABS: 60.0000 mg | ORAL_TABLET | Freq: Every day | ORAL | Status: DC

## 2013-12-17 MED ORDER — OXYCODONE HCL 10 MG PO TABS: 10.0000 mg | ORAL_TABLET | ORAL | Status: DC | PRN

## 2013-12-17 MED ORDER — POTASSIUM CHLORIDE CRYS ER 20 MEQ PO TBCR: 40.0000 meq | EXTENDED_RELEASE_TABLET | Freq: Every day | ORAL | Status: DC

## 2013-12-17 MED ORDER — SIMVASTATIN 20 MG PO TABS: 20.0000 mg | ORAL_TABLET | Freq: Every day | ORAL | Status: DC

## 2013-12-17 MED ORDER — INSULIN ASPART 100 UNIT/ML ~~LOC~~ SOLN: SUBCUTANEOUS | Status: DC

## 2013-12-17 MED ORDER — ALPRAZOLAM 2 MG PO TABS: 2.0000 mg | ORAL_TABLET | Freq: Every evening | ORAL | Status: AC | PRN

## 2013-12-17 NOTE — Discharge Summary (Addendum)
[place] TEAM 1 - Stepdown/ICU TEAM Discharge Summary  Ramonte Mena LTJ:[id number] DOB: [birth year] DOA: 12/02/2013 PCP: Renato Shin, MD    Admit date: 12/02/2013 Discharge date: 12/17/2013  Discharge Diagnoses:  Principal Problem:  Generalized weakness Active Problems:  Essential hypertension  Chronic diastolic HF (heart failure)  Ventilator dependence  Obesity hypoventilation syndrome  Diabetic gastroparesis  Physical deconditioning  Fall at home  Tracheostomy in place  Chronic respiratory failure with hypercapnia   Discharge Condition: Stable  Diet recommendation: Heart healthy/carb modified   Admit HPI / Brief Narrative: 68 y.o. 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 On previous discharge patient was to be placed in SNF.  Patient has complex hx of obesity hypoventilation syndrome with chronic respiratory failure tracheostomy dependent and ventilator dependent at night.  The patient is presenting with 3 falls in the last 3 days. Lives at home with his daughter. He was recently hospitalized in September with similar situation and at that point was recommended to be discharged at [facility] but he decided to go home instead and works with home health and home physical therapy. He has been working with home health and home physical therapy throughout the month of October without any significant improvement. Last week he started having recurrent fall in the fall he describes are due to generalized weakness of bilateral lower legs. Patient denies any focal deficit head injury and neck injury a pleasant episode. Patient denies any chest pain or tachycardia. Patient mentions he is taking all his  medications regularly and there is no recent change in his medication. Patient also denies any complaint of fever, chills, nausea, vomiting, abdominal pain, diarrhea, burning urination.  The patient is coming from home And at his baseline independent for most of his ADL.   HPI/Subjective: 12/1 A/O 4, sitting comfortably in chair, states there was still a problem with transportation to SNF, and understands that he will be going tomorrow to [place].   Assessment/Plan: Generalized weakness / deconditioned state -Recurring problem, agrees to [place] that PATIENT NOT BE READMITTED FOR FALL, unless evidence of acute treatable disease process. -Ambulated around ward with walker yesterday without weakness.  Blood cultures 1 of 2 with coag negative species - contaminant and does not require antibiotic treatment   CKD stage III  -baseline creatinine 1.3-2  - Cr at baseline    Chronic diastolic heart failure  -EF 50-55% via TTE April 2015, w/ grade 1 DD  - Admission weight 158.5 kg, ; base weight ~160.6 kg, 12/1 weight = 155.8 kg -continue strict in and out; since admission-19.6 L -Daily a.m. Weight -Patient currently at his base weight will continue Lasix 60 mg daily (home dose)  Morbid obesity - Body mass index is 43.52 kg/(m^2).   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 Chronic respiratory failure due to OSA / OSH Hx PE - on coumadin, INR subtherapeutic on admission Continue nocturnal vent support (per last discharge summary, prior settings were PRVC, Rate 14, VT 600, PEEP 5). Continue diurnal ATC, wean O2 for saturations of 88-92%. Currently 94% on RA  Pulmonary hygiene. Continue coumadin per  pharmacy. PRN bronchodilators. 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  -CBG reasonably controlled at present  - 9/10 hemoglobin A1c = 7.3 Cont lantus , premeal novolog    HTN - uncontrolled  -Continue amlodipine 10 mg daily  -Continue Coreg 6.25 mg BID  -Continue clonidine 0.1 mg BID  -Continue Lasix 60 mg daily -Continue losartan 50 mg daily   HLD  -Continue Zocor 20 mg daily   Severe B knee pain  -Chronic, no change from previous admission  - PT/OT; recommends SNF   - pain controlwith oxycodone IR 10 mg PO every 4 hours PRN moderate pain      Code Status: FULL  Family Communication: no family present at time of exam  Disposition Plan: Patient has agreed to SNF in [place]      Consultants: NA  Procedure/Significant Events: 4/12 echocardiogram;- Left ventricle: mildly dilated. mild LVH.- LVEF= 50% to 55%.  -(grade 1 diastolic dysfunction).   Culture   NA  Antibiotics: NA  DVT prophylaxis: Coumadin  Devices Chronic trach present  LINES / TUBES:     Continuous Infusions:   Objective: VITAL SIGNS: Temp: 98.3 F (36.8 C) (12/02 0748) Temp Source: Oral (12/02 0748) BP: 116/65 mmHg (12/02 0759) Pulse Rate: 62 (12/02 0555) SPO2; FIO2:   Intake/Output Summary (Last 24 hours) at 12/17/13 7035 Last data filed at 12/17/13 0093  Gross per 24 hour  Intake    120 ml  Output   1500 ml  Net  -1380 ml     Exam: General: A/O 4, NAD, sitting in chair on room air, No acute respiratory distress, tracheostomy in place negative sign of infection. Lungs: Clear to auscultation bilaterally without wheezes or crackles Cardiovascular: Regular rate and rhythm without murmur gallop or rub normal S1 and S2 Abdomen: Morbidly obese, Nontender, nondistended, soft, bowel sounds positive, no rebound, no ascites, no appreciable mass Extremities: Bilateral lower  extremity venous stasis ulcers which are healing well (eschar over both), No significant cyanosis, clubbing, or edema bilateral lower extremities  Data Reviewed: Basic Metabolic Panel:  Recent Labs Lab 12/11/13 0300 12/14/13 0235  NA 135* 138  K 4.5 4.5  CL 101 102  CO2 22 24  GLUCOSE 119* 154*  BUN 28* 31*  CREATININE 1.63* 1.59*  CALCIUM 8.9 8.9   Liver Function Tests: No results for input(s): AST, ALT, ALKPHOS, BILITOT, PROT, ALBUMIN in the last 168 hours. No results for input(s): LIPASE, AMYLASE in the last 168 hours. No results for input(s): AMMONIA in the last 168 hours. CBC:  Recent Labs Lab 12/11/13 0300 12/14/13 0235  WBC 4.9 5.0  HGB 10.7* 10.5*  HCT 32.4* 32.2*  MCV 83.5 82.6  PLT 178 158   Cardiac Enzymes: No results for input(s): CKTOTAL, CKMB, CKMBINDEX, TROPONINI in the last 168 hours. BNP (last 3 results)  Recent Labs  08/03/13 1600 09/24/13 1701 12/03/13 0121  PROBNP 844.9* 7.4 1519.0*   CBG:  Recent Labs Lab 12/15/13 2201 12/16/13 0811 12/16/13 1206 12/16/13 1701 12/16/13 2212  GLUCAP 127* 117* 141* 111* 154*    No results found for this or any previous visit (from the past 240 hour(s)).   Studies:  Recent x-ray studies have been reviewed in detail by the Attending Physician  Scheduled Meds:  Scheduled Meds: . amLODipine  10 mg Oral Daily  . antiseptic oral rinse  7 mL Mouth Rinse QID  . carvedilol  6.25 mg Oral BID WC  . chlorhexidine  15 mL Mouth Rinse BID  . citalopram  20 mg Oral Daily  . cloNIDine  0.1 mg Oral BID  . furosemide  60 mg Oral Daily  . insulin aspart  0-15 Units Subcutaneous TID WC  . insulin aspart  0-5 Units Subcutaneous QHS  . insulin glargine  28 Units Subcutaneous q morning - 10a  . losartan  50 mg Oral Daily  . metoCLOPramide  5 mg Oral TID  . polyethylene glycol  17 g Oral Daily  . potassium chloride SA  40 mEq Oral Daily  . senna-docusate  1 tablet Oral BID  . simvastatin  20 mg Oral q1800  .  sodium chloride  3 mL Intravenous Q12H  . warfarin  10 mg Oral q1800  . Warfarin - Pharmacist Dosing Inpatient   Does not apply q1800    Time spent on care of this patient: 67 mins    Reyne Dumas , MD   [facility] Office  [phone] Pager - [phone]  On-Call/Text Page:      [email or web address]      password [email or web address]  If 7PM-7AM, please contact night-coverage [email or web address] Password [email or web address] 12/17/2013, 8:28 AM   LOS: 15 days

## 2013-12-17 NOTE — Progress Notes (Signed)
Patient transferred to [facility] at 12 noon. Report called at 11am with all questions resolved.

## 2013-12-17 NOTE — Progress Notes (Signed)
   Name: Travis Chapman MRN: [id number] DOB: Nov 17, 1945    ADMISSION DATE:  12/02/2013 CONSULTATION DATE:  12/02/2013  REFERRING MD :  TRH  CHIEF COMPLAINT:  Mechanical fall, chronic respiratory failure s/p tracheostomy  BRIEF PATIENT DESCRIPTION: 67 yobm with morbid obesity complicated by OHS with chronic trach (diurnal ATC, noctural vent) for chronic resp failure brought to [facility] ED 11/17 after he suffered a few mechanical falls while at home.  Fully asymptomatic; however, admitted overnight by [facility] for observation.  PCCM consulted for vent management.  SIGNIFICANT EVENTS  11/17  Admitted after multiple falls 11/23  Awake, alert, appears to be at baseline.    STUDIES:  CT head 11/17 >>> negative  SUBJECTIVE: No complaints. Currently trach is open to RA. States he will not use PMV. Tolerating well.   VITAL SIGNS: Temp:  [98.3 F (36.8 C)-98.9 F (37.2 C)] 98.3 F (36.8 C) (12/02 0748) Pulse Rate:  [53-62] 62 (12/02 0555) Resp:  [14-20] 16 (12/02 0759) BP: (103-132)/(56-69) 116/65 mmHg (12/02 0759) SpO2:  [96 %-100 %] 98 % (12/02 0759) FiO2 (%):  [40 %] 40 % (12/02 0305) Weight:  [154.8 kg (341 lb 4.4 oz)] 154.8 kg (341 lb 4.4 oz) (12/02 0404)  PHYSICAL EXAMINATION: General: Morbidly obese male, in bed, in NAD. Neuro: A&O x 3, non-focal.  HEENT: [place]/AT. PERRL, sclerae anicteric. chronic tracheostomy. Cardiovascular: RRR, no M/R/G appreciated. Lungs: Respirations even and unlabored.  CTA bilaterally, No W/R/R.  Chronic trach. Abdomen: Obese, BS x 4, soft, NT/ND.  Musculoskeletal: Chronic venous stasis changes bilaterally, no edema.  Skin: Intact, warm, no rashes.  Recent Labs Lab 12/11/13 0300 12/14/13 0235  NA 135* 138  K 4.5 4.5  CL 101 102  CO2 22 24  BUN 28* 31*  CREATININE 1.63* 1.59*  GLUCOSE 119* 154*    Recent Labs Lab 12/11/13 0300 12/14/13 0235  HGB 10.7* 10.5*  HCT 32.4* 32.2*  WBC 4.9 5.0  PLT 178 158   No results found.   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 Chronic respiratory failure due to OSA / OSH Hx PE - on coumadin, INR subtherapeutic on admission  Recs: Continue nocturnal vent support (per last discharge summary, prior settings were PRVC, Rate 14, VT 600, PEEP 5). Continue diurnal ATC, wean O2 for saturations of 88-92%. Currently 94% on RA Pulmonary hygiene. Continue coumadin per pharmacy. PRN bronchodilators. Push mobilization as able  Generalized deconditioning  Recs: PT / OT  Needs SNF rehab where he can do vent qhs as this is his 5th admit in last 6 months  Maintain nocturnal vent, ATC during the day and continue bronchodilators, no needs for further weaning efforts.  Needs placement pending [facility] application.  Anticipate transfer there in the near future.  PCCM will sign off, little change in vent settings.  Would just send to vent SNF on current settings with nocturnal vent and ATC during the day.  Rush Farmer, M.D. [facility] Pulmonary/Critical Care Medicine. Pager: [phone]. After hours pager: [phone].

## 2014-01-16 DIAGNOSIS — M25562 Pain in left knee: Secondary | ICD-10-CM | POA: Diagnosis not present

## 2014-01-16 DIAGNOSIS — Z970 Presence of artificial eye: Secondary | ICD-10-CM | POA: Diagnosis not present

## 2014-01-16 DIAGNOSIS — H2512 Age-related nuclear cataract, left eye: Secondary | ICD-10-CM | POA: Diagnosis not present

## 2014-01-16 DIAGNOSIS — H25012 Cortical age-related cataract, left eye: Secondary | ICD-10-CM | POA: Diagnosis not present

## 2014-01-16 DIAGNOSIS — J9610 Chronic respiratory failure, unspecified whether with hypoxia or hypercapnia: Secondary | ICD-10-CM | POA: Diagnosis not present

## 2014-01-16 DIAGNOSIS — F419 Anxiety disorder, unspecified: Secondary | ICD-10-CM | POA: Diagnosis not present

## 2014-01-16 DIAGNOSIS — E108 Type 1 diabetes mellitus with unspecified complications: Secondary | ICD-10-CM | POA: Diagnosis not present

## 2014-01-16 DIAGNOSIS — Z7901 Long term (current) use of anticoagulants: Secondary | ICD-10-CM | POA: Diagnosis not present

## 2014-01-16 DIAGNOSIS — M25561 Pain in right knee: Secondary | ICD-10-CM | POA: Diagnosis not present

## 2014-01-16 DIAGNOSIS — I10 Essential (primary) hypertension: Secondary | ICD-10-CM | POA: Diagnosis not present

## 2014-01-16 DIAGNOSIS — I2699 Other pulmonary embolism without acute cor pulmonale: Secondary | ICD-10-CM | POA: Diagnosis not present

## 2014-01-16 DIAGNOSIS — I82409 Acute embolism and thrombosis of unspecified deep veins of unspecified lower extremity: Secondary | ICD-10-CM | POA: Diagnosis not present

## 2014-01-16 DIAGNOSIS — E662 Morbid (severe) obesity with alveolar hypoventilation: Secondary | ICD-10-CM | POA: Diagnosis not present

## 2014-01-16 DIAGNOSIS — J9622 Acute and chronic respiratory failure with hypercapnia: Secondary | ICD-10-CM | POA: Diagnosis not present

## 2014-01-16 DIAGNOSIS — M170 Bilateral primary osteoarthritis of knee: Secondary | ICD-10-CM | POA: Diagnosis not present

## 2014-01-16 DIAGNOSIS — I509 Heart failure, unspecified: Secondary | ICD-10-CM | POA: Diagnosis not present

## 2014-01-16 DIAGNOSIS — I87312 Chronic venous hypertension (idiopathic) with ulcer of left lower extremity: Secondary | ICD-10-CM | POA: Diagnosis not present

## 2014-01-16 DIAGNOSIS — E785 Hyperlipidemia, unspecified: Secondary | ICD-10-CM | POA: Diagnosis not present

## 2014-01-16 DIAGNOSIS — I4891 Unspecified atrial fibrillation: Secondary | ICD-10-CM | POA: Diagnosis not present

## 2014-01-16 DIAGNOSIS — Z5181 Encounter for therapeutic drug level monitoring: Secondary | ICD-10-CM | POA: Diagnosis not present

## 2014-01-16 DIAGNOSIS — E119 Type 2 diabetes mellitus without complications: Secondary | ICD-10-CM | POA: Diagnosis not present

## 2014-01-20 DIAGNOSIS — I10 Essential (primary) hypertension: Secondary | ICD-10-CM | POA: Diagnosis not present

## 2014-01-20 DIAGNOSIS — Z5181 Encounter for therapeutic drug level monitoring: Secondary | ICD-10-CM | POA: Diagnosis not present

## 2014-01-20 DIAGNOSIS — M170 Bilateral primary osteoarthritis of knee: Secondary | ICD-10-CM | POA: Diagnosis not present

## 2014-01-20 DIAGNOSIS — I2699 Other pulmonary embolism without acute cor pulmonale: Secondary | ICD-10-CM | POA: Diagnosis not present

## 2014-01-27 ENCOUNTER — Ambulatory Visit: Payer: Medicare Other | Admitting: Endocrinology

## 2014-01-28 ENCOUNTER — Telehealth: Payer: Self-pay | Admitting: Endocrinology

## 2014-01-28 DIAGNOSIS — E108 Type 1 diabetes mellitus with unspecified complications: Secondary | ICD-10-CM | POA: Diagnosis not present

## 2014-01-28 DIAGNOSIS — J9610 Chronic respiratory failure, unspecified whether with hypoxia or hypercapnia: Secondary | ICD-10-CM | POA: Diagnosis not present

## 2014-01-28 NOTE — Telephone Encounter (Signed)
Tried to reach pt. Spoke with pt's mother, she advised the pt is currently in a rehab facility. She stated she did not know the name of the facilit and she did not know how long he would be staying.

## 2014-01-28 NOTE — Telephone Encounter (Signed)
Patient no showed today's appt. Please advise on how to follow up. °A. No follow up necessary. °B. Follow up urgent. Contact patient immediately. °C. Follow up necessary. Contact patient and schedule visit in ___ days. °D. Follow up advised. Contact patient and schedule visit in ____weeks. ° °

## 2014-01-28 NOTE — Telephone Encounter (Signed)
Follow up advised. Contact patient and schedule visit in 2 weeks. 

## 2014-02-03 DIAGNOSIS — M170 Bilateral primary osteoarthritis of knee: Secondary | ICD-10-CM | POA: Diagnosis not present

## 2014-02-03 DIAGNOSIS — I2699 Other pulmonary embolism without acute cor pulmonale: Secondary | ICD-10-CM | POA: Diagnosis not present

## 2014-02-03 DIAGNOSIS — M25561 Pain in right knee: Secondary | ICD-10-CM | POA: Diagnosis not present

## 2014-02-03 DIAGNOSIS — Z7901 Long term (current) use of anticoagulants: Secondary | ICD-10-CM | POA: Diagnosis not present

## 2014-02-03 DIAGNOSIS — M25562 Pain in left knee: Secondary | ICD-10-CM | POA: Diagnosis not present

## 2014-02-04 DIAGNOSIS — J9610 Chronic respiratory failure, unspecified whether with hypoxia or hypercapnia: Secondary | ICD-10-CM | POA: Diagnosis not present

## 2014-02-11 DIAGNOSIS — J9610 Chronic respiratory failure, unspecified whether with hypoxia or hypercapnia: Secondary | ICD-10-CM | POA: Diagnosis not present

## 2014-02-11 DIAGNOSIS — I10 Essential (primary) hypertension: Secondary | ICD-10-CM | POA: Diagnosis not present

## 2014-02-11 DIAGNOSIS — M170 Bilateral primary osteoarthritis of knee: Secondary | ICD-10-CM | POA: Diagnosis not present

## 2014-02-11 DIAGNOSIS — I2699 Other pulmonary embolism without acute cor pulmonale: Secondary | ICD-10-CM | POA: Diagnosis not present

## 2014-02-12 DIAGNOSIS — E119 Type 2 diabetes mellitus without complications: Secondary | ICD-10-CM | POA: Diagnosis not present

## 2014-02-12 DIAGNOSIS — Z970 Presence of artificial eye: Secondary | ICD-10-CM | POA: Diagnosis not present

## 2014-02-12 DIAGNOSIS — H2512 Age-related nuclear cataract, left eye: Secondary | ICD-10-CM | POA: Diagnosis not present

## 2014-02-12 DIAGNOSIS — H25012 Cortical age-related cataract, left eye: Secondary | ICD-10-CM | POA: Diagnosis not present

## 2014-02-16 DIAGNOSIS — I2699 Other pulmonary embolism without acute cor pulmonale: Secondary | ICD-10-CM | POA: Diagnosis not present

## 2014-02-16 DIAGNOSIS — Z5181 Encounter for therapeutic drug level monitoring: Secondary | ICD-10-CM | POA: Diagnosis not present

## 2014-02-16 DIAGNOSIS — I87312 Chronic venous hypertension (idiopathic) with ulcer of left lower extremity: Secondary | ICD-10-CM | POA: Diagnosis not present

## 2014-02-17 DIAGNOSIS — I2699 Other pulmonary embolism without acute cor pulmonale: Secondary | ICD-10-CM | POA: Diagnosis not present

## 2014-02-17 DIAGNOSIS — M170 Bilateral primary osteoarthritis of knee: Secondary | ICD-10-CM | POA: Diagnosis not present

## 2014-02-17 DIAGNOSIS — Z5181 Encounter for therapeutic drug level monitoring: Secondary | ICD-10-CM | POA: Diagnosis not present

## 2014-02-17 DIAGNOSIS — E108 Type 1 diabetes mellitus with unspecified complications: Secondary | ICD-10-CM | POA: Diagnosis not present

## 2014-02-19 DIAGNOSIS — I10 Essential (primary) hypertension: Secondary | ICD-10-CM | POA: Diagnosis not present

## 2014-02-19 DIAGNOSIS — G9009 Other idiopathic peripheral autonomic neuropathy: Secondary | ICD-10-CM | POA: Diagnosis not present

## 2014-02-19 DIAGNOSIS — E785 Hyperlipidemia, unspecified: Secondary | ICD-10-CM | POA: Diagnosis not present

## 2014-02-19 DIAGNOSIS — M6281 Muscle weakness (generalized): Secondary | ICD-10-CM | POA: Diagnosis not present

## 2014-02-19 DIAGNOSIS — J45909 Unspecified asthma, uncomplicated: Secondary | ICD-10-CM | POA: Diagnosis not present

## 2014-02-19 DIAGNOSIS — J9622 Acute and chronic respiratory failure with hypercapnia: Secondary | ICD-10-CM | POA: Diagnosis not present

## 2014-02-19 DIAGNOSIS — E109 Type 1 diabetes mellitus without complications: Secondary | ICD-10-CM | POA: Diagnosis not present

## 2014-02-19 DIAGNOSIS — J9610 Chronic respiratory failure, unspecified whether with hypoxia or hypercapnia: Secondary | ICD-10-CM | POA: Diagnosis not present

## 2014-02-19 DIAGNOSIS — R296 Repeated falls: Secondary | ICD-10-CM | POA: Diagnosis not present

## 2014-02-19 DIAGNOSIS — F419 Anxiety disorder, unspecified: Secondary | ICD-10-CM | POA: Diagnosis not present

## 2014-02-19 DIAGNOSIS — Z9911 Dependence on respirator [ventilator] status: Secondary | ICD-10-CM | POA: Diagnosis not present

## 2014-02-19 DIAGNOSIS — E662 Morbid (severe) obesity with alveolar hypoventilation: Secondary | ICD-10-CM | POA: Diagnosis not present

## 2014-02-20 DIAGNOSIS — R600 Localized edema: Secondary | ICD-10-CM | POA: Diagnosis not present

## 2014-02-20 DIAGNOSIS — Z9911 Dependence on respirator [ventilator] status: Secondary | ICD-10-CM | POA: Diagnosis not present

## 2014-02-20 DIAGNOSIS — J45909 Unspecified asthma, uncomplicated: Secondary | ICD-10-CM | POA: Diagnosis not present

## 2014-02-20 DIAGNOSIS — F419 Anxiety disorder, unspecified: Secondary | ICD-10-CM | POA: Diagnosis not present

## 2014-02-20 DIAGNOSIS — M6281 Muscle weakness (generalized): Secondary | ICD-10-CM | POA: Diagnosis not present

## 2014-02-20 DIAGNOSIS — E662 Morbid (severe) obesity with alveolar hypoventilation: Secondary | ICD-10-CM | POA: Diagnosis not present

## 2014-02-20 DIAGNOSIS — G9009 Other idiopathic peripheral autonomic neuropathy: Secondary | ICD-10-CM | POA: Diagnosis not present

## 2014-02-20 DIAGNOSIS — E785 Hyperlipidemia, unspecified: Secondary | ICD-10-CM | POA: Diagnosis not present

## 2014-02-20 DIAGNOSIS — E109 Type 1 diabetes mellitus without complications: Secondary | ICD-10-CM | POA: Diagnosis not present

## 2014-02-20 DIAGNOSIS — J9610 Chronic respiratory failure, unspecified whether with hypoxia or hypercapnia: Secondary | ICD-10-CM | POA: Diagnosis not present

## 2014-02-20 DIAGNOSIS — I10 Essential (primary) hypertension: Secondary | ICD-10-CM | POA: Diagnosis not present

## 2014-02-20 DIAGNOSIS — R296 Repeated falls: Secondary | ICD-10-CM | POA: Diagnosis not present

## 2014-02-20 DIAGNOSIS — J9622 Acute and chronic respiratory failure with hypercapnia: Secondary | ICD-10-CM | POA: Diagnosis not present

## 2014-02-21 DIAGNOSIS — M6281 Muscle weakness (generalized): Secondary | ICD-10-CM | POA: Diagnosis not present

## 2014-02-21 DIAGNOSIS — F419 Anxiety disorder, unspecified: Secondary | ICD-10-CM | POA: Diagnosis not present

## 2014-02-21 DIAGNOSIS — E785 Hyperlipidemia, unspecified: Secondary | ICD-10-CM | POA: Diagnosis not present

## 2014-02-21 DIAGNOSIS — J45909 Unspecified asthma, uncomplicated: Secondary | ICD-10-CM | POA: Diagnosis not present

## 2014-02-21 DIAGNOSIS — Z9911 Dependence on respirator [ventilator] status: Secondary | ICD-10-CM | POA: Diagnosis not present

## 2014-02-21 DIAGNOSIS — J9610 Chronic respiratory failure, unspecified whether with hypoxia or hypercapnia: Secondary | ICD-10-CM | POA: Diagnosis not present

## 2014-02-21 DIAGNOSIS — I10 Essential (primary) hypertension: Secondary | ICD-10-CM | POA: Diagnosis not present

## 2014-02-21 DIAGNOSIS — E109 Type 1 diabetes mellitus without complications: Secondary | ICD-10-CM | POA: Diagnosis not present

## 2014-02-21 DIAGNOSIS — R296 Repeated falls: Secondary | ICD-10-CM | POA: Diagnosis not present

## 2014-02-21 DIAGNOSIS — J9622 Acute and chronic respiratory failure with hypercapnia: Secondary | ICD-10-CM | POA: Diagnosis not present

## 2014-02-21 DIAGNOSIS — G9009 Other idiopathic peripheral autonomic neuropathy: Secondary | ICD-10-CM | POA: Diagnosis not present

## 2014-02-21 DIAGNOSIS — E662 Morbid (severe) obesity with alveolar hypoventilation: Secondary | ICD-10-CM | POA: Diagnosis not present

## 2014-02-23 DIAGNOSIS — Z9911 Dependence on respirator [ventilator] status: Secondary | ICD-10-CM | POA: Diagnosis not present

## 2014-02-23 DIAGNOSIS — R296 Repeated falls: Secondary | ICD-10-CM | POA: Diagnosis not present

## 2014-02-23 DIAGNOSIS — E785 Hyperlipidemia, unspecified: Secondary | ICD-10-CM | POA: Diagnosis not present

## 2014-02-23 DIAGNOSIS — G9009 Other idiopathic peripheral autonomic neuropathy: Secondary | ICD-10-CM | POA: Diagnosis not present

## 2014-02-23 DIAGNOSIS — F329 Major depressive disorder, single episode, unspecified: Secondary | ICD-10-CM | POA: Diagnosis not present

## 2014-02-23 DIAGNOSIS — J45909 Unspecified asthma, uncomplicated: Secondary | ICD-10-CM | POA: Diagnosis not present

## 2014-02-23 DIAGNOSIS — J9622 Acute and chronic respiratory failure with hypercapnia: Secondary | ICD-10-CM | POA: Diagnosis not present

## 2014-02-23 DIAGNOSIS — I10 Essential (primary) hypertension: Secondary | ICD-10-CM | POA: Diagnosis not present

## 2014-02-23 DIAGNOSIS — M6281 Muscle weakness (generalized): Secondary | ICD-10-CM | POA: Diagnosis not present

## 2014-02-23 DIAGNOSIS — K3184 Gastroparesis: Secondary | ICD-10-CM | POA: Diagnosis not present

## 2014-02-23 DIAGNOSIS — E662 Morbid (severe) obesity with alveolar hypoventilation: Secondary | ICD-10-CM | POA: Diagnosis not present

## 2014-02-23 DIAGNOSIS — J9610 Chronic respiratory failure, unspecified whether with hypoxia or hypercapnia: Secondary | ICD-10-CM | POA: Diagnosis not present

## 2014-02-23 DIAGNOSIS — E109 Type 1 diabetes mellitus without complications: Secondary | ICD-10-CM | POA: Diagnosis not present

## 2014-02-23 DIAGNOSIS — F419 Anxiety disorder, unspecified: Secondary | ICD-10-CM | POA: Diagnosis not present

## 2014-02-24 DIAGNOSIS — J45909 Unspecified asthma, uncomplicated: Secondary | ICD-10-CM | POA: Diagnosis not present

## 2014-02-24 DIAGNOSIS — R296 Repeated falls: Secondary | ICD-10-CM | POA: Diagnosis not present

## 2014-02-24 DIAGNOSIS — E662 Morbid (severe) obesity with alveolar hypoventilation: Secondary | ICD-10-CM | POA: Diagnosis not present

## 2014-02-24 DIAGNOSIS — M6281 Muscle weakness (generalized): Secondary | ICD-10-CM | POA: Diagnosis not present

## 2014-02-24 DIAGNOSIS — E785 Hyperlipidemia, unspecified: Secondary | ICD-10-CM | POA: Diagnosis not present

## 2014-02-24 DIAGNOSIS — G9009 Other idiopathic peripheral autonomic neuropathy: Secondary | ICD-10-CM | POA: Diagnosis not present

## 2014-02-24 DIAGNOSIS — J9610 Chronic respiratory failure, unspecified whether with hypoxia or hypercapnia: Secondary | ICD-10-CM | POA: Diagnosis not present

## 2014-02-24 DIAGNOSIS — I10 Essential (primary) hypertension: Secondary | ICD-10-CM | POA: Diagnosis not present

## 2014-02-24 DIAGNOSIS — E109 Type 1 diabetes mellitus without complications: Secondary | ICD-10-CM | POA: Diagnosis not present

## 2014-02-24 DIAGNOSIS — J9622 Acute and chronic respiratory failure with hypercapnia: Secondary | ICD-10-CM | POA: Diagnosis not present

## 2014-02-24 DIAGNOSIS — F419 Anxiety disorder, unspecified: Secondary | ICD-10-CM | POA: Diagnosis not present

## 2014-02-24 DIAGNOSIS — I509 Heart failure, unspecified: Secondary | ICD-10-CM | POA: Diagnosis not present

## 2014-02-24 DIAGNOSIS — Z9911 Dependence on respirator [ventilator] status: Secondary | ICD-10-CM | POA: Diagnosis not present

## 2014-02-25 DIAGNOSIS — E662 Morbid (severe) obesity with alveolar hypoventilation: Secondary | ICD-10-CM | POA: Diagnosis not present

## 2014-02-25 DIAGNOSIS — R296 Repeated falls: Secondary | ICD-10-CM | POA: Diagnosis not present

## 2014-02-25 DIAGNOSIS — E109 Type 1 diabetes mellitus without complications: Secondary | ICD-10-CM | POA: Diagnosis not present

## 2014-02-25 DIAGNOSIS — M6281 Muscle weakness (generalized): Secondary | ICD-10-CM | POA: Diagnosis not present

## 2014-02-25 DIAGNOSIS — J9622 Acute and chronic respiratory failure with hypercapnia: Secondary | ICD-10-CM | POA: Diagnosis not present

## 2014-02-25 DIAGNOSIS — G9009 Other idiopathic peripheral autonomic neuropathy: Secondary | ICD-10-CM | POA: Diagnosis not present

## 2014-02-25 DIAGNOSIS — I10 Essential (primary) hypertension: Secondary | ICD-10-CM | POA: Diagnosis not present

## 2014-02-25 DIAGNOSIS — E785 Hyperlipidemia, unspecified: Secondary | ICD-10-CM | POA: Diagnosis not present

## 2014-02-25 DIAGNOSIS — J9610 Chronic respiratory failure, unspecified whether with hypoxia or hypercapnia: Secondary | ICD-10-CM | POA: Diagnosis not present

## 2014-02-25 DIAGNOSIS — F419 Anxiety disorder, unspecified: Secondary | ICD-10-CM | POA: Diagnosis not present

## 2014-02-25 DIAGNOSIS — J45909 Unspecified asthma, uncomplicated: Secondary | ICD-10-CM | POA: Diagnosis not present

## 2014-02-25 DIAGNOSIS — Z9911 Dependence on respirator [ventilator] status: Secondary | ICD-10-CM | POA: Diagnosis not present

## 2014-02-26 DIAGNOSIS — F419 Anxiety disorder, unspecified: Secondary | ICD-10-CM | POA: Diagnosis not present

## 2014-02-26 DIAGNOSIS — J9622 Acute and chronic respiratory failure with hypercapnia: Secondary | ICD-10-CM | POA: Diagnosis not present

## 2014-02-26 DIAGNOSIS — E785 Hyperlipidemia, unspecified: Secondary | ICD-10-CM | POA: Diagnosis not present

## 2014-02-26 DIAGNOSIS — G9009 Other idiopathic peripheral autonomic neuropathy: Secondary | ICD-10-CM | POA: Diagnosis not present

## 2014-02-26 DIAGNOSIS — M6281 Muscle weakness (generalized): Secondary | ICD-10-CM | POA: Diagnosis not present

## 2014-02-26 DIAGNOSIS — E109 Type 1 diabetes mellitus without complications: Secondary | ICD-10-CM | POA: Diagnosis not present

## 2014-02-26 DIAGNOSIS — R296 Repeated falls: Secondary | ICD-10-CM | POA: Diagnosis not present

## 2014-02-26 DIAGNOSIS — I10 Essential (primary) hypertension: Secondary | ICD-10-CM | POA: Diagnosis not present

## 2014-02-26 DIAGNOSIS — Z9911 Dependence on respirator [ventilator] status: Secondary | ICD-10-CM | POA: Diagnosis not present

## 2014-02-26 DIAGNOSIS — E662 Morbid (severe) obesity with alveolar hypoventilation: Secondary | ICD-10-CM | POA: Diagnosis not present

## 2014-02-26 DIAGNOSIS — J9610 Chronic respiratory failure, unspecified whether with hypoxia or hypercapnia: Secondary | ICD-10-CM | POA: Diagnosis not present

## 2014-02-26 DIAGNOSIS — J45909 Unspecified asthma, uncomplicated: Secondary | ICD-10-CM | POA: Diagnosis not present

## 2014-02-27 DIAGNOSIS — E662 Morbid (severe) obesity with alveolar hypoventilation: Secondary | ICD-10-CM | POA: Diagnosis not present

## 2014-02-27 DIAGNOSIS — J45909 Unspecified asthma, uncomplicated: Secondary | ICD-10-CM | POA: Diagnosis not present

## 2014-02-27 DIAGNOSIS — E109 Type 1 diabetes mellitus without complications: Secondary | ICD-10-CM | POA: Diagnosis not present

## 2014-02-27 DIAGNOSIS — R296 Repeated falls: Secondary | ICD-10-CM | POA: Diagnosis not present

## 2014-02-27 DIAGNOSIS — G9009 Other idiopathic peripheral autonomic neuropathy: Secondary | ICD-10-CM | POA: Diagnosis not present

## 2014-02-27 DIAGNOSIS — M6281 Muscle weakness (generalized): Secondary | ICD-10-CM | POA: Diagnosis not present

## 2014-02-27 DIAGNOSIS — J9622 Acute and chronic respiratory failure with hypercapnia: Secondary | ICD-10-CM | POA: Diagnosis not present

## 2014-02-27 DIAGNOSIS — F419 Anxiety disorder, unspecified: Secondary | ICD-10-CM | POA: Diagnosis not present

## 2014-02-27 DIAGNOSIS — J9610 Chronic respiratory failure, unspecified whether with hypoxia or hypercapnia: Secondary | ICD-10-CM | POA: Diagnosis not present

## 2014-02-27 DIAGNOSIS — Z9911 Dependence on respirator [ventilator] status: Secondary | ICD-10-CM | POA: Diagnosis not present

## 2014-02-27 DIAGNOSIS — E785 Hyperlipidemia, unspecified: Secondary | ICD-10-CM | POA: Diagnosis not present

## 2014-02-27 DIAGNOSIS — I10 Essential (primary) hypertension: Secondary | ICD-10-CM | POA: Diagnosis not present

## 2014-03-02 DIAGNOSIS — I10 Essential (primary) hypertension: Secondary | ICD-10-CM | POA: Diagnosis not present

## 2014-03-02 DIAGNOSIS — I482 Chronic atrial fibrillation: Secondary | ICD-10-CM | POA: Diagnosis not present

## 2014-03-03 DIAGNOSIS — I10 Essential (primary) hypertension: Secondary | ICD-10-CM | POA: Diagnosis not present

## 2014-03-03 DIAGNOSIS — M25561 Pain in right knee: Secondary | ICD-10-CM | POA: Diagnosis not present

## 2014-03-03 DIAGNOSIS — G9009 Other idiopathic peripheral autonomic neuropathy: Secondary | ICD-10-CM | POA: Diagnosis not present

## 2014-03-03 DIAGNOSIS — E785 Hyperlipidemia, unspecified: Secondary | ICD-10-CM | POA: Diagnosis not present

## 2014-03-03 DIAGNOSIS — E662 Morbid (severe) obesity with alveolar hypoventilation: Secondary | ICD-10-CM | POA: Diagnosis not present

## 2014-03-03 DIAGNOSIS — R296 Repeated falls: Secondary | ICD-10-CM | POA: Diagnosis not present

## 2014-03-03 DIAGNOSIS — E109 Type 1 diabetes mellitus without complications: Secondary | ICD-10-CM | POA: Diagnosis not present

## 2014-03-03 DIAGNOSIS — M6281 Muscle weakness (generalized): Secondary | ICD-10-CM | POA: Diagnosis not present

## 2014-03-03 DIAGNOSIS — J9610 Chronic respiratory failure, unspecified whether with hypoxia or hypercapnia: Secondary | ICD-10-CM | POA: Diagnosis not present

## 2014-03-03 DIAGNOSIS — F419 Anxiety disorder, unspecified: Secondary | ICD-10-CM | POA: Diagnosis not present

## 2014-03-03 DIAGNOSIS — J9622 Acute and chronic respiratory failure with hypercapnia: Secondary | ICD-10-CM | POA: Diagnosis not present

## 2014-03-03 DIAGNOSIS — Z9911 Dependence on respirator [ventilator] status: Secondary | ICD-10-CM | POA: Diagnosis not present

## 2014-03-03 DIAGNOSIS — I2699 Other pulmonary embolism without acute cor pulmonale: Secondary | ICD-10-CM | POA: Diagnosis not present

## 2014-03-03 DIAGNOSIS — J45909 Unspecified asthma, uncomplicated: Secondary | ICD-10-CM | POA: Diagnosis not present

## 2014-03-03 DIAGNOSIS — M170 Bilateral primary osteoarthritis of knee: Secondary | ICD-10-CM | POA: Diagnosis not present

## 2014-03-03 DIAGNOSIS — R791 Abnormal coagulation profile: Secondary | ICD-10-CM | POA: Diagnosis not present

## 2014-03-04 DIAGNOSIS — E662 Morbid (severe) obesity with alveolar hypoventilation: Secondary | ICD-10-CM | POA: Diagnosis not present

## 2014-03-04 DIAGNOSIS — Z9911 Dependence on respirator [ventilator] status: Secondary | ICD-10-CM | POA: Diagnosis not present

## 2014-03-04 DIAGNOSIS — G9009 Other idiopathic peripheral autonomic neuropathy: Secondary | ICD-10-CM | POA: Diagnosis not present

## 2014-03-04 DIAGNOSIS — F419 Anxiety disorder, unspecified: Secondary | ICD-10-CM | POA: Diagnosis not present

## 2014-03-04 DIAGNOSIS — E109 Type 1 diabetes mellitus without complications: Secondary | ICD-10-CM | POA: Diagnosis not present

## 2014-03-04 DIAGNOSIS — J9610 Chronic respiratory failure, unspecified whether with hypoxia or hypercapnia: Secondary | ICD-10-CM | POA: Diagnosis not present

## 2014-03-04 DIAGNOSIS — R296 Repeated falls: Secondary | ICD-10-CM | POA: Diagnosis not present

## 2014-03-04 DIAGNOSIS — I10 Essential (primary) hypertension: Secondary | ICD-10-CM | POA: Diagnosis not present

## 2014-03-04 DIAGNOSIS — E785 Hyperlipidemia, unspecified: Secondary | ICD-10-CM | POA: Diagnosis not present

## 2014-03-04 DIAGNOSIS — J9622 Acute and chronic respiratory failure with hypercapnia: Secondary | ICD-10-CM | POA: Diagnosis not present

## 2014-03-04 DIAGNOSIS — M6281 Muscle weakness (generalized): Secondary | ICD-10-CM | POA: Diagnosis not present

## 2014-03-04 DIAGNOSIS — J45909 Unspecified asthma, uncomplicated: Secondary | ICD-10-CM | POA: Diagnosis not present

## 2014-03-05 DIAGNOSIS — E785 Hyperlipidemia, unspecified: Secondary | ICD-10-CM | POA: Diagnosis not present

## 2014-03-05 DIAGNOSIS — J9610 Chronic respiratory failure, unspecified whether with hypoxia or hypercapnia: Secondary | ICD-10-CM | POA: Diagnosis not present

## 2014-03-05 DIAGNOSIS — M6281 Muscle weakness (generalized): Secondary | ICD-10-CM | POA: Diagnosis not present

## 2014-03-05 DIAGNOSIS — R296 Repeated falls: Secondary | ICD-10-CM | POA: Diagnosis not present

## 2014-03-05 DIAGNOSIS — E662 Morbid (severe) obesity with alveolar hypoventilation: Secondary | ICD-10-CM | POA: Diagnosis not present

## 2014-03-05 DIAGNOSIS — I10 Essential (primary) hypertension: Secondary | ICD-10-CM | POA: Diagnosis not present

## 2014-03-05 DIAGNOSIS — F419 Anxiety disorder, unspecified: Secondary | ICD-10-CM | POA: Diagnosis not present

## 2014-03-05 DIAGNOSIS — J9622 Acute and chronic respiratory failure with hypercapnia: Secondary | ICD-10-CM | POA: Diagnosis not present

## 2014-03-05 DIAGNOSIS — G9009 Other idiopathic peripheral autonomic neuropathy: Secondary | ICD-10-CM | POA: Diagnosis not present

## 2014-03-05 DIAGNOSIS — E109 Type 1 diabetes mellitus without complications: Secondary | ICD-10-CM | POA: Diagnosis not present

## 2014-03-05 DIAGNOSIS — Z9911 Dependence on respirator [ventilator] status: Secondary | ICD-10-CM | POA: Diagnosis not present

## 2014-03-05 DIAGNOSIS — J45909 Unspecified asthma, uncomplicated: Secondary | ICD-10-CM | POA: Diagnosis not present

## 2014-03-06 DIAGNOSIS — E662 Morbid (severe) obesity with alveolar hypoventilation: Secondary | ICD-10-CM | POA: Diagnosis not present

## 2014-03-06 DIAGNOSIS — I10 Essential (primary) hypertension: Secondary | ICD-10-CM | POA: Diagnosis not present

## 2014-03-06 DIAGNOSIS — M6281 Muscle weakness (generalized): Secondary | ICD-10-CM | POA: Diagnosis not present

## 2014-03-06 DIAGNOSIS — J9622 Acute and chronic respiratory failure with hypercapnia: Secondary | ICD-10-CM | POA: Diagnosis not present

## 2014-03-06 DIAGNOSIS — F419 Anxiety disorder, unspecified: Secondary | ICD-10-CM | POA: Diagnosis not present

## 2014-03-06 DIAGNOSIS — E785 Hyperlipidemia, unspecified: Secondary | ICD-10-CM | POA: Diagnosis not present

## 2014-03-06 DIAGNOSIS — J45909 Unspecified asthma, uncomplicated: Secondary | ICD-10-CM | POA: Diagnosis not present

## 2014-03-06 DIAGNOSIS — G9009 Other idiopathic peripheral autonomic neuropathy: Secondary | ICD-10-CM | POA: Diagnosis not present

## 2014-03-06 DIAGNOSIS — J9610 Chronic respiratory failure, unspecified whether with hypoxia or hypercapnia: Secondary | ICD-10-CM | POA: Diagnosis not present

## 2014-03-06 DIAGNOSIS — Z9911 Dependence on respirator [ventilator] status: Secondary | ICD-10-CM | POA: Diagnosis not present

## 2014-03-06 DIAGNOSIS — E109 Type 1 diabetes mellitus without complications: Secondary | ICD-10-CM | POA: Diagnosis not present

## 2014-03-06 DIAGNOSIS — R296 Repeated falls: Secondary | ICD-10-CM | POA: Diagnosis not present

## 2014-03-09 DIAGNOSIS — F419 Anxiety disorder, unspecified: Secondary | ICD-10-CM | POA: Diagnosis not present

## 2014-03-09 DIAGNOSIS — I4891 Unspecified atrial fibrillation: Secondary | ICD-10-CM | POA: Diagnosis not present

## 2014-03-09 DIAGNOSIS — G9009 Other idiopathic peripheral autonomic neuropathy: Secondary | ICD-10-CM | POA: Diagnosis not present

## 2014-03-09 DIAGNOSIS — Z9911 Dependence on respirator [ventilator] status: Secondary | ICD-10-CM | POA: Diagnosis not present

## 2014-03-09 DIAGNOSIS — E785 Hyperlipidemia, unspecified: Secondary | ICD-10-CM | POA: Diagnosis not present

## 2014-03-09 DIAGNOSIS — M6281 Muscle weakness (generalized): Secondary | ICD-10-CM | POA: Diagnosis not present

## 2014-03-09 DIAGNOSIS — J9610 Chronic respiratory failure, unspecified whether with hypoxia or hypercapnia: Secondary | ICD-10-CM | POA: Diagnosis not present

## 2014-03-09 DIAGNOSIS — J45909 Unspecified asthma, uncomplicated: Secondary | ICD-10-CM | POA: Diagnosis not present

## 2014-03-09 DIAGNOSIS — E109 Type 1 diabetes mellitus without complications: Secondary | ICD-10-CM | POA: Diagnosis not present

## 2014-03-09 DIAGNOSIS — E662 Morbid (severe) obesity with alveolar hypoventilation: Secondary | ICD-10-CM | POA: Diagnosis not present

## 2014-03-09 DIAGNOSIS — M170 Bilateral primary osteoarthritis of knee: Secondary | ICD-10-CM | POA: Diagnosis not present

## 2014-03-09 DIAGNOSIS — I10 Essential (primary) hypertension: Secondary | ICD-10-CM | POA: Diagnosis not present

## 2014-03-09 DIAGNOSIS — R296 Repeated falls: Secondary | ICD-10-CM | POA: Diagnosis not present

## 2014-03-09 DIAGNOSIS — J9622 Acute and chronic respiratory failure with hypercapnia: Secondary | ICD-10-CM | POA: Diagnosis not present

## 2014-03-10 DIAGNOSIS — G9009 Other idiopathic peripheral autonomic neuropathy: Secondary | ICD-10-CM | POA: Diagnosis not present

## 2014-03-10 DIAGNOSIS — E785 Hyperlipidemia, unspecified: Secondary | ICD-10-CM | POA: Diagnosis not present

## 2014-03-10 DIAGNOSIS — J45909 Unspecified asthma, uncomplicated: Secondary | ICD-10-CM | POA: Diagnosis not present

## 2014-03-10 DIAGNOSIS — R296 Repeated falls: Secondary | ICD-10-CM | POA: Diagnosis not present

## 2014-03-10 DIAGNOSIS — J9622 Acute and chronic respiratory failure with hypercapnia: Secondary | ICD-10-CM | POA: Diagnosis not present

## 2014-03-10 DIAGNOSIS — Z9911 Dependence on respirator [ventilator] status: Secondary | ICD-10-CM | POA: Diagnosis not present

## 2014-03-10 DIAGNOSIS — M6281 Muscle weakness (generalized): Secondary | ICD-10-CM | POA: Diagnosis not present

## 2014-03-10 DIAGNOSIS — I10 Essential (primary) hypertension: Secondary | ICD-10-CM | POA: Diagnosis not present

## 2014-03-10 DIAGNOSIS — J9610 Chronic respiratory failure, unspecified whether with hypoxia or hypercapnia: Secondary | ICD-10-CM | POA: Diagnosis not present

## 2014-03-10 DIAGNOSIS — E109 Type 1 diabetes mellitus without complications: Secondary | ICD-10-CM | POA: Diagnosis not present

## 2014-03-10 DIAGNOSIS — E662 Morbid (severe) obesity with alveolar hypoventilation: Secondary | ICD-10-CM | POA: Diagnosis not present

## 2014-03-10 DIAGNOSIS — F419 Anxiety disorder, unspecified: Secondary | ICD-10-CM | POA: Diagnosis not present

## 2014-03-11 DIAGNOSIS — Z86711 Personal history of pulmonary embolism: Secondary | ICD-10-CM | POA: Diagnosis not present

## 2014-03-11 DIAGNOSIS — M170 Bilateral primary osteoarthritis of knee: Secondary | ICD-10-CM | POA: Diagnosis not present

## 2014-03-11 DIAGNOSIS — J45909 Unspecified asthma, uncomplicated: Secondary | ICD-10-CM | POA: Diagnosis not present

## 2014-03-11 DIAGNOSIS — Z9911 Dependence on respirator [ventilator] status: Secondary | ICD-10-CM | POA: Diagnosis not present

## 2014-03-11 DIAGNOSIS — K5900 Constipation, unspecified: Secondary | ICD-10-CM | POA: Diagnosis not present

## 2014-03-11 DIAGNOSIS — I10 Essential (primary) hypertension: Secondary | ICD-10-CM | POA: Diagnosis not present

## 2014-03-11 DIAGNOSIS — R296 Repeated falls: Secondary | ICD-10-CM | POA: Diagnosis not present

## 2014-03-11 DIAGNOSIS — E662 Morbid (severe) obesity with alveolar hypoventilation: Secondary | ICD-10-CM | POA: Diagnosis not present

## 2014-03-11 DIAGNOSIS — E109 Type 1 diabetes mellitus without complications: Secondary | ICD-10-CM | POA: Diagnosis not present

## 2014-03-11 DIAGNOSIS — E785 Hyperlipidemia, unspecified: Secondary | ICD-10-CM | POA: Diagnosis not present

## 2014-03-11 DIAGNOSIS — G9009 Other idiopathic peripheral autonomic neuropathy: Secondary | ICD-10-CM | POA: Diagnosis not present

## 2014-03-11 DIAGNOSIS — F419 Anxiety disorder, unspecified: Secondary | ICD-10-CM | POA: Diagnosis not present

## 2014-03-11 DIAGNOSIS — M6281 Muscle weakness (generalized): Secondary | ICD-10-CM | POA: Diagnosis not present

## 2014-03-11 DIAGNOSIS — J9610 Chronic respiratory failure, unspecified whether with hypoxia or hypercapnia: Secondary | ICD-10-CM | POA: Diagnosis not present

## 2014-03-11 DIAGNOSIS — J9622 Acute and chronic respiratory failure with hypercapnia: Secondary | ICD-10-CM | POA: Diagnosis not present

## 2014-03-12 DIAGNOSIS — I10 Essential (primary) hypertension: Secondary | ICD-10-CM | POA: Diagnosis not present

## 2014-03-12 DIAGNOSIS — M6281 Muscle weakness (generalized): Secondary | ICD-10-CM | POA: Diagnosis not present

## 2014-03-12 DIAGNOSIS — E109 Type 1 diabetes mellitus without complications: Secondary | ICD-10-CM | POA: Diagnosis not present

## 2014-03-12 DIAGNOSIS — E662 Morbid (severe) obesity with alveolar hypoventilation: Secondary | ICD-10-CM | POA: Diagnosis not present

## 2014-03-12 DIAGNOSIS — G9009 Other idiopathic peripheral autonomic neuropathy: Secondary | ICD-10-CM | POA: Diagnosis not present

## 2014-03-12 DIAGNOSIS — E785 Hyperlipidemia, unspecified: Secondary | ICD-10-CM | POA: Diagnosis not present

## 2014-03-12 DIAGNOSIS — F419 Anxiety disorder, unspecified: Secondary | ICD-10-CM | POA: Diagnosis not present

## 2014-03-12 DIAGNOSIS — J9610 Chronic respiratory failure, unspecified whether with hypoxia or hypercapnia: Secondary | ICD-10-CM | POA: Diagnosis not present

## 2014-03-12 DIAGNOSIS — R296 Repeated falls: Secondary | ICD-10-CM | POA: Diagnosis not present

## 2014-03-12 DIAGNOSIS — Z9911 Dependence on respirator [ventilator] status: Secondary | ICD-10-CM | POA: Diagnosis not present

## 2014-03-12 DIAGNOSIS — J45909 Unspecified asthma, uncomplicated: Secondary | ICD-10-CM | POA: Diagnosis not present

## 2014-03-12 DIAGNOSIS — J9622 Acute and chronic respiratory failure with hypercapnia: Secondary | ICD-10-CM | POA: Diagnosis not present

## 2014-03-13 DIAGNOSIS — J9610 Chronic respiratory failure, unspecified whether with hypoxia or hypercapnia: Secondary | ICD-10-CM | POA: Diagnosis not present

## 2014-03-13 DIAGNOSIS — R296 Repeated falls: Secondary | ICD-10-CM | POA: Diagnosis not present

## 2014-03-13 DIAGNOSIS — G9009 Other idiopathic peripheral autonomic neuropathy: Secondary | ICD-10-CM | POA: Diagnosis not present

## 2014-03-13 DIAGNOSIS — E662 Morbid (severe) obesity with alveolar hypoventilation: Secondary | ICD-10-CM | POA: Diagnosis not present

## 2014-03-13 DIAGNOSIS — E109 Type 1 diabetes mellitus without complications: Secondary | ICD-10-CM | POA: Diagnosis not present

## 2014-03-13 DIAGNOSIS — F419 Anxiety disorder, unspecified: Secondary | ICD-10-CM | POA: Diagnosis not present

## 2014-03-13 DIAGNOSIS — Z9911 Dependence on respirator [ventilator] status: Secondary | ICD-10-CM | POA: Diagnosis not present

## 2014-03-13 DIAGNOSIS — M6281 Muscle weakness (generalized): Secondary | ICD-10-CM | POA: Diagnosis not present

## 2014-03-13 DIAGNOSIS — J9622 Acute and chronic respiratory failure with hypercapnia: Secondary | ICD-10-CM | POA: Diagnosis not present

## 2014-03-13 DIAGNOSIS — E785 Hyperlipidemia, unspecified: Secondary | ICD-10-CM | POA: Diagnosis not present

## 2014-03-13 DIAGNOSIS — J45909 Unspecified asthma, uncomplicated: Secondary | ICD-10-CM | POA: Diagnosis not present

## 2014-03-13 DIAGNOSIS — I10 Essential (primary) hypertension: Secondary | ICD-10-CM | POA: Diagnosis not present

## 2014-03-15 DIAGNOSIS — Z9911 Dependence on respirator [ventilator] status: Secondary | ICD-10-CM | POA: Diagnosis not present

## 2014-03-15 DIAGNOSIS — J9622 Acute and chronic respiratory failure with hypercapnia: Secondary | ICD-10-CM | POA: Diagnosis not present

## 2014-03-15 DIAGNOSIS — E785 Hyperlipidemia, unspecified: Secondary | ICD-10-CM | POA: Diagnosis not present

## 2014-03-15 DIAGNOSIS — M6281 Muscle weakness (generalized): Secondary | ICD-10-CM | POA: Diagnosis not present

## 2014-03-15 DIAGNOSIS — J9610 Chronic respiratory failure, unspecified whether with hypoxia or hypercapnia: Secondary | ICD-10-CM | POA: Diagnosis not present

## 2014-03-15 DIAGNOSIS — G9009 Other idiopathic peripheral autonomic neuropathy: Secondary | ICD-10-CM | POA: Diagnosis not present

## 2014-03-15 DIAGNOSIS — I10 Essential (primary) hypertension: Secondary | ICD-10-CM | POA: Diagnosis not present

## 2014-03-15 DIAGNOSIS — E109 Type 1 diabetes mellitus without complications: Secondary | ICD-10-CM | POA: Diagnosis not present

## 2014-03-15 DIAGNOSIS — E662 Morbid (severe) obesity with alveolar hypoventilation: Secondary | ICD-10-CM | POA: Diagnosis not present

## 2014-03-15 DIAGNOSIS — R296 Repeated falls: Secondary | ICD-10-CM | POA: Diagnosis not present

## 2014-03-15 DIAGNOSIS — J45909 Unspecified asthma, uncomplicated: Secondary | ICD-10-CM | POA: Diagnosis not present

## 2014-03-15 DIAGNOSIS — F419 Anxiety disorder, unspecified: Secondary | ICD-10-CM | POA: Diagnosis not present

## 2014-03-16 ENCOUNTER — Ambulatory Visit (INDEPENDENT_AMBULATORY_CARE_PROVIDER_SITE_OTHER): Payer: Self-pay | Admitting: General Practice

## 2014-03-16 DIAGNOSIS — I8291 Chronic embolism and thrombosis of unspecified vein: Secondary | ICD-10-CM | POA: Diagnosis not present

## 2014-03-16 DIAGNOSIS — J9610 Chronic respiratory failure, unspecified whether with hypoxia or hypercapnia: Secondary | ICD-10-CM | POA: Diagnosis not present

## 2014-03-16 DIAGNOSIS — F419 Anxiety disorder, unspecified: Secondary | ICD-10-CM | POA: Diagnosis not present

## 2014-03-16 DIAGNOSIS — G9009 Other idiopathic peripheral autonomic neuropathy: Secondary | ICD-10-CM | POA: Diagnosis not present

## 2014-03-16 DIAGNOSIS — J9622 Acute and chronic respiratory failure with hypercapnia: Secondary | ICD-10-CM | POA: Diagnosis not present

## 2014-03-16 DIAGNOSIS — M170 Bilateral primary osteoarthritis of knee: Secondary | ICD-10-CM | POA: Diagnosis not present

## 2014-03-16 DIAGNOSIS — M6281 Muscle weakness (generalized): Secondary | ICD-10-CM | POA: Diagnosis not present

## 2014-03-16 DIAGNOSIS — Z9911 Dependence on respirator [ventilator] status: Secondary | ICD-10-CM | POA: Diagnosis not present

## 2014-03-16 DIAGNOSIS — E109 Type 1 diabetes mellitus without complications: Secondary | ICD-10-CM | POA: Diagnosis not present

## 2014-03-16 DIAGNOSIS — I10 Essential (primary) hypertension: Secondary | ICD-10-CM | POA: Diagnosis not present

## 2014-03-16 DIAGNOSIS — E785 Hyperlipidemia, unspecified: Secondary | ICD-10-CM | POA: Diagnosis not present

## 2014-03-16 DIAGNOSIS — E662 Morbid (severe) obesity with alveolar hypoventilation: Secondary | ICD-10-CM | POA: Diagnosis not present

## 2014-03-16 DIAGNOSIS — R296 Repeated falls: Secondary | ICD-10-CM | POA: Diagnosis not present

## 2014-03-16 DIAGNOSIS — J45909 Unspecified asthma, uncomplicated: Secondary | ICD-10-CM | POA: Diagnosis not present

## 2014-03-17 DIAGNOSIS — G9009 Other idiopathic peripheral autonomic neuropathy: Secondary | ICD-10-CM | POA: Diagnosis not present

## 2014-03-17 DIAGNOSIS — F419 Anxiety disorder, unspecified: Secondary | ICD-10-CM | POA: Diagnosis not present

## 2014-03-17 DIAGNOSIS — E109 Type 1 diabetes mellitus without complications: Secondary | ICD-10-CM | POA: Diagnosis not present

## 2014-03-17 DIAGNOSIS — M6281 Muscle weakness (generalized): Secondary | ICD-10-CM | POA: Diagnosis not present

## 2014-03-17 DIAGNOSIS — J9622 Acute and chronic respiratory failure with hypercapnia: Secondary | ICD-10-CM | POA: Diagnosis not present

## 2014-03-17 DIAGNOSIS — M170 Bilateral primary osteoarthritis of knee: Secondary | ICD-10-CM | POA: Diagnosis not present

## 2014-03-17 DIAGNOSIS — E662 Morbid (severe) obesity with alveolar hypoventilation: Secondary | ICD-10-CM | POA: Diagnosis not present

## 2014-03-17 DIAGNOSIS — J45909 Unspecified asthma, uncomplicated: Secondary | ICD-10-CM | POA: Diagnosis not present

## 2014-03-17 DIAGNOSIS — I2699 Other pulmonary embolism without acute cor pulmonale: Secondary | ICD-10-CM | POA: Diagnosis not present

## 2014-03-17 DIAGNOSIS — E785 Hyperlipidemia, unspecified: Secondary | ICD-10-CM | POA: Diagnosis not present

## 2014-03-17 DIAGNOSIS — R296 Repeated falls: Secondary | ICD-10-CM | POA: Diagnosis not present

## 2014-03-17 DIAGNOSIS — J9610 Chronic respiratory failure, unspecified whether with hypoxia or hypercapnia: Secondary | ICD-10-CM | POA: Diagnosis not present

## 2014-03-17 DIAGNOSIS — Z9911 Dependence on respirator [ventilator] status: Secondary | ICD-10-CM | POA: Diagnosis not present

## 2014-03-17 DIAGNOSIS — I10 Essential (primary) hypertension: Secondary | ICD-10-CM | POA: Diagnosis not present

## 2014-03-17 DIAGNOSIS — Z5181 Encounter for therapeutic drug level monitoring: Secondary | ICD-10-CM | POA: Diagnosis not present

## 2014-03-18 DIAGNOSIS — J9610 Chronic respiratory failure, unspecified whether with hypoxia or hypercapnia: Secondary | ICD-10-CM | POA: Diagnosis not present

## 2014-03-18 DIAGNOSIS — E785 Hyperlipidemia, unspecified: Secondary | ICD-10-CM | POA: Diagnosis not present

## 2014-03-18 DIAGNOSIS — E109 Type 1 diabetes mellitus without complications: Secondary | ICD-10-CM | POA: Diagnosis not present

## 2014-03-18 DIAGNOSIS — M6281 Muscle weakness (generalized): Secondary | ICD-10-CM | POA: Diagnosis not present

## 2014-03-18 DIAGNOSIS — R296 Repeated falls: Secondary | ICD-10-CM | POA: Diagnosis not present

## 2014-03-18 DIAGNOSIS — F419 Anxiety disorder, unspecified: Secondary | ICD-10-CM | POA: Diagnosis not present

## 2014-03-18 DIAGNOSIS — J9622 Acute and chronic respiratory failure with hypercapnia: Secondary | ICD-10-CM | POA: Diagnosis not present

## 2014-03-18 DIAGNOSIS — E662 Morbid (severe) obesity with alveolar hypoventilation: Secondary | ICD-10-CM | POA: Diagnosis not present

## 2014-03-18 DIAGNOSIS — Z9911 Dependence on respirator [ventilator] status: Secondary | ICD-10-CM | POA: Diagnosis not present

## 2014-03-18 DIAGNOSIS — I10 Essential (primary) hypertension: Secondary | ICD-10-CM | POA: Diagnosis not present

## 2014-03-18 DIAGNOSIS — J45909 Unspecified asthma, uncomplicated: Secondary | ICD-10-CM | POA: Diagnosis not present

## 2014-03-18 DIAGNOSIS — G9009 Other idiopathic peripheral autonomic neuropathy: Secondary | ICD-10-CM | POA: Diagnosis not present

## 2014-03-19 DIAGNOSIS — M6281 Muscle weakness (generalized): Secondary | ICD-10-CM | POA: Diagnosis not present

## 2014-03-19 DIAGNOSIS — E785 Hyperlipidemia, unspecified: Secondary | ICD-10-CM | POA: Diagnosis not present

## 2014-03-19 DIAGNOSIS — E662 Morbid (severe) obesity with alveolar hypoventilation: Secondary | ICD-10-CM | POA: Diagnosis not present

## 2014-03-19 DIAGNOSIS — I10 Essential (primary) hypertension: Secondary | ICD-10-CM | POA: Diagnosis not present

## 2014-03-19 DIAGNOSIS — F419 Anxiety disorder, unspecified: Secondary | ICD-10-CM | POA: Diagnosis not present

## 2014-03-19 DIAGNOSIS — Z9911 Dependence on respirator [ventilator] status: Secondary | ICD-10-CM | POA: Diagnosis not present

## 2014-03-19 DIAGNOSIS — J9622 Acute and chronic respiratory failure with hypercapnia: Secondary | ICD-10-CM | POA: Diagnosis not present

## 2014-03-19 DIAGNOSIS — R296 Repeated falls: Secondary | ICD-10-CM | POA: Diagnosis not present

## 2014-03-19 DIAGNOSIS — G9009 Other idiopathic peripheral autonomic neuropathy: Secondary | ICD-10-CM | POA: Diagnosis not present

## 2014-03-19 DIAGNOSIS — E109 Type 1 diabetes mellitus without complications: Secondary | ICD-10-CM | POA: Diagnosis not present

## 2014-03-19 DIAGNOSIS — J9610 Chronic respiratory failure, unspecified whether with hypoxia or hypercapnia: Secondary | ICD-10-CM | POA: Diagnosis not present

## 2014-03-19 DIAGNOSIS — J45909 Unspecified asthma, uncomplicated: Secondary | ICD-10-CM | POA: Diagnosis not present

## 2014-03-20 DIAGNOSIS — I872 Venous insufficiency (chronic) (peripheral): Secondary | ICD-10-CM | POA: Diagnosis not present

## 2014-03-20 DIAGNOSIS — Z7901 Long term (current) use of anticoagulants: Secondary | ICD-10-CM | POA: Diagnosis not present

## 2014-03-20 DIAGNOSIS — F419 Anxiety disorder, unspecified: Secondary | ICD-10-CM | POA: Diagnosis not present

## 2014-03-20 DIAGNOSIS — E785 Hyperlipidemia, unspecified: Secondary | ICD-10-CM | POA: Diagnosis not present

## 2014-03-20 DIAGNOSIS — E109 Type 1 diabetes mellitus without complications: Secondary | ICD-10-CM | POA: Diagnosis not present

## 2014-03-20 DIAGNOSIS — J9622 Acute and chronic respiratory failure with hypercapnia: Secondary | ICD-10-CM | POA: Diagnosis not present

## 2014-03-20 DIAGNOSIS — E662 Morbid (severe) obesity with alveolar hypoventilation: Secondary | ICD-10-CM | POA: Diagnosis not present

## 2014-03-20 DIAGNOSIS — J45909 Unspecified asthma, uncomplicated: Secondary | ICD-10-CM | POA: Diagnosis not present

## 2014-03-20 DIAGNOSIS — M6281 Muscle weakness (generalized): Secondary | ICD-10-CM | POA: Diagnosis not present

## 2014-03-20 DIAGNOSIS — I10 Essential (primary) hypertension: Secondary | ICD-10-CM | POA: Diagnosis not present

## 2014-03-20 DIAGNOSIS — Z9911 Dependence on respirator [ventilator] status: Secondary | ICD-10-CM | POA: Diagnosis not present

## 2014-03-20 DIAGNOSIS — J9610 Chronic respiratory failure, unspecified whether with hypoxia or hypercapnia: Secondary | ICD-10-CM | POA: Diagnosis not present

## 2014-03-20 DIAGNOSIS — I2699 Other pulmonary embolism without acute cor pulmonale: Secondary | ICD-10-CM | POA: Diagnosis not present

## 2014-03-20 DIAGNOSIS — R296 Repeated falls: Secondary | ICD-10-CM | POA: Diagnosis not present

## 2014-03-20 DIAGNOSIS — G9009 Other idiopathic peripheral autonomic neuropathy: Secondary | ICD-10-CM | POA: Diagnosis not present

## 2014-03-23 DIAGNOSIS — I5032 Chronic diastolic (congestive) heart failure: Secondary | ICD-10-CM | POA: Diagnosis not present

## 2014-03-23 DIAGNOSIS — Z9911 Dependence on respirator [ventilator] status: Secondary | ICD-10-CM | POA: Diagnosis not present

## 2014-03-23 DIAGNOSIS — M6281 Muscle weakness (generalized): Secondary | ICD-10-CM | POA: Diagnosis not present

## 2014-03-23 DIAGNOSIS — J45909 Unspecified asthma, uncomplicated: Secondary | ICD-10-CM | POA: Diagnosis not present

## 2014-03-23 DIAGNOSIS — R278 Other lack of coordination: Secondary | ICD-10-CM | POA: Diagnosis not present

## 2014-03-23 DIAGNOSIS — R296 Repeated falls: Secondary | ICD-10-CM | POA: Diagnosis not present

## 2014-03-23 DIAGNOSIS — E109 Type 1 diabetes mellitus without complications: Secondary | ICD-10-CM | POA: Diagnosis not present

## 2014-03-23 DIAGNOSIS — J9622 Acute and chronic respiratory failure with hypercapnia: Secondary | ICD-10-CM | POA: Diagnosis not present

## 2014-03-23 DIAGNOSIS — I872 Venous insufficiency (chronic) (peripheral): Secondary | ICD-10-CM | POA: Diagnosis not present

## 2014-03-23 DIAGNOSIS — M170 Bilateral primary osteoarthritis of knee: Secondary | ICD-10-CM | POA: Diagnosis not present

## 2014-03-23 DIAGNOSIS — I10 Essential (primary) hypertension: Secondary | ICD-10-CM | POA: Diagnosis not present

## 2014-03-23 DIAGNOSIS — E662 Morbid (severe) obesity with alveolar hypoventilation: Secondary | ICD-10-CM | POA: Diagnosis not present

## 2014-03-23 DIAGNOSIS — G9009 Other idiopathic peripheral autonomic neuropathy: Secondary | ICD-10-CM | POA: Diagnosis not present

## 2014-03-23 DIAGNOSIS — J9610 Chronic respiratory failure, unspecified whether with hypoxia or hypercapnia: Secondary | ICD-10-CM | POA: Diagnosis not present

## 2014-03-23 DIAGNOSIS — E785 Hyperlipidemia, unspecified: Secondary | ICD-10-CM | POA: Diagnosis not present

## 2014-03-23 DIAGNOSIS — F419 Anxiety disorder, unspecified: Secondary | ICD-10-CM | POA: Diagnosis not present

## 2014-03-24 DIAGNOSIS — J45909 Unspecified asthma, uncomplicated: Secondary | ICD-10-CM | POA: Diagnosis not present

## 2014-03-24 DIAGNOSIS — E662 Morbid (severe) obesity with alveolar hypoventilation: Secondary | ICD-10-CM | POA: Diagnosis not present

## 2014-03-24 DIAGNOSIS — E109 Type 1 diabetes mellitus without complications: Secondary | ICD-10-CM | POA: Diagnosis not present

## 2014-03-24 DIAGNOSIS — E785 Hyperlipidemia, unspecified: Secondary | ICD-10-CM | POA: Diagnosis not present

## 2014-03-24 DIAGNOSIS — J9610 Chronic respiratory failure, unspecified whether with hypoxia or hypercapnia: Secondary | ICD-10-CM | POA: Diagnosis not present

## 2014-03-24 DIAGNOSIS — R296 Repeated falls: Secondary | ICD-10-CM | POA: Diagnosis not present

## 2014-03-24 DIAGNOSIS — G9009 Other idiopathic peripheral autonomic neuropathy: Secondary | ICD-10-CM | POA: Diagnosis not present

## 2014-03-24 DIAGNOSIS — M6281 Muscle weakness (generalized): Secondary | ICD-10-CM | POA: Diagnosis not present

## 2014-03-24 DIAGNOSIS — I10 Essential (primary) hypertension: Secondary | ICD-10-CM | POA: Diagnosis not present

## 2014-03-24 DIAGNOSIS — Z9911 Dependence on respirator [ventilator] status: Secondary | ICD-10-CM | POA: Diagnosis not present

## 2014-03-24 DIAGNOSIS — F419 Anxiety disorder, unspecified: Secondary | ICD-10-CM | POA: Diagnosis not present

## 2014-03-24 DIAGNOSIS — J9622 Acute and chronic respiratory failure with hypercapnia: Secondary | ICD-10-CM | POA: Diagnosis not present

## 2014-03-25 DIAGNOSIS — E109 Type 1 diabetes mellitus without complications: Secondary | ICD-10-CM | POA: Diagnosis not present

## 2014-03-25 DIAGNOSIS — I10 Essential (primary) hypertension: Secondary | ICD-10-CM | POA: Diagnosis not present

## 2014-03-25 DIAGNOSIS — G9009 Other idiopathic peripheral autonomic neuropathy: Secondary | ICD-10-CM | POA: Diagnosis not present

## 2014-03-25 DIAGNOSIS — J9622 Acute and chronic respiratory failure with hypercapnia: Secondary | ICD-10-CM | POA: Diagnosis not present

## 2014-03-25 DIAGNOSIS — J45909 Unspecified asthma, uncomplicated: Secondary | ICD-10-CM | POA: Diagnosis not present

## 2014-03-25 DIAGNOSIS — M6281 Muscle weakness (generalized): Secondary | ICD-10-CM | POA: Diagnosis not present

## 2014-03-25 DIAGNOSIS — F419 Anxiety disorder, unspecified: Secondary | ICD-10-CM | POA: Diagnosis not present

## 2014-03-25 DIAGNOSIS — E785 Hyperlipidemia, unspecified: Secondary | ICD-10-CM | POA: Diagnosis not present

## 2014-03-25 DIAGNOSIS — E662 Morbid (severe) obesity with alveolar hypoventilation: Secondary | ICD-10-CM | POA: Diagnosis not present

## 2014-03-25 DIAGNOSIS — R296 Repeated falls: Secondary | ICD-10-CM | POA: Diagnosis not present

## 2014-03-25 DIAGNOSIS — J9610 Chronic respiratory failure, unspecified whether with hypoxia or hypercapnia: Secondary | ICD-10-CM | POA: Diagnosis not present

## 2014-03-25 DIAGNOSIS — Z9911 Dependence on respirator [ventilator] status: Secondary | ICD-10-CM | POA: Diagnosis not present

## 2014-03-25 DIAGNOSIS — I509 Heart failure, unspecified: Secondary | ICD-10-CM | POA: Diagnosis not present

## 2014-03-26 DIAGNOSIS — E785 Hyperlipidemia, unspecified: Secondary | ICD-10-CM | POA: Diagnosis not present

## 2014-03-26 DIAGNOSIS — E662 Morbid (severe) obesity with alveolar hypoventilation: Secondary | ICD-10-CM | POA: Diagnosis not present

## 2014-03-26 DIAGNOSIS — I10 Essential (primary) hypertension: Secondary | ICD-10-CM | POA: Diagnosis not present

## 2014-03-26 DIAGNOSIS — M6281 Muscle weakness (generalized): Secondary | ICD-10-CM | POA: Diagnosis not present

## 2014-03-26 DIAGNOSIS — G9009 Other idiopathic peripheral autonomic neuropathy: Secondary | ICD-10-CM | POA: Diagnosis not present

## 2014-03-26 DIAGNOSIS — Z9911 Dependence on respirator [ventilator] status: Secondary | ICD-10-CM | POA: Diagnosis not present

## 2014-03-26 DIAGNOSIS — J9610 Chronic respiratory failure, unspecified whether with hypoxia or hypercapnia: Secondary | ICD-10-CM | POA: Diagnosis not present

## 2014-03-26 DIAGNOSIS — E109 Type 1 diabetes mellitus without complications: Secondary | ICD-10-CM | POA: Diagnosis not present

## 2014-03-26 DIAGNOSIS — R296 Repeated falls: Secondary | ICD-10-CM | POA: Diagnosis not present

## 2014-03-26 DIAGNOSIS — F419 Anxiety disorder, unspecified: Secondary | ICD-10-CM | POA: Diagnosis not present

## 2014-03-26 DIAGNOSIS — J45909 Unspecified asthma, uncomplicated: Secondary | ICD-10-CM | POA: Diagnosis not present

## 2014-03-26 DIAGNOSIS — J9622 Acute and chronic respiratory failure with hypercapnia: Secondary | ICD-10-CM | POA: Diagnosis not present

## 2014-03-27 DIAGNOSIS — J9622 Acute and chronic respiratory failure with hypercapnia: Secondary | ICD-10-CM | POA: Diagnosis not present

## 2014-03-27 DIAGNOSIS — G9009 Other idiopathic peripheral autonomic neuropathy: Secondary | ICD-10-CM | POA: Diagnosis not present

## 2014-03-27 DIAGNOSIS — R296 Repeated falls: Secondary | ICD-10-CM | POA: Diagnosis not present

## 2014-03-27 DIAGNOSIS — E109 Type 1 diabetes mellitus without complications: Secondary | ICD-10-CM | POA: Diagnosis not present

## 2014-03-27 DIAGNOSIS — M6281 Muscle weakness (generalized): Secondary | ICD-10-CM | POA: Diagnosis not present

## 2014-03-27 DIAGNOSIS — E785 Hyperlipidemia, unspecified: Secondary | ICD-10-CM | POA: Diagnosis not present

## 2014-03-27 DIAGNOSIS — J45909 Unspecified asthma, uncomplicated: Secondary | ICD-10-CM | POA: Diagnosis not present

## 2014-03-27 DIAGNOSIS — F419 Anxiety disorder, unspecified: Secondary | ICD-10-CM | POA: Diagnosis not present

## 2014-03-27 DIAGNOSIS — I10 Essential (primary) hypertension: Secondary | ICD-10-CM | POA: Diagnosis not present

## 2014-03-27 DIAGNOSIS — Z9911 Dependence on respirator [ventilator] status: Secondary | ICD-10-CM | POA: Diagnosis not present

## 2014-03-27 DIAGNOSIS — J9610 Chronic respiratory failure, unspecified whether with hypoxia or hypercapnia: Secondary | ICD-10-CM | POA: Diagnosis not present

## 2014-03-27 DIAGNOSIS — E662 Morbid (severe) obesity with alveolar hypoventilation: Secondary | ICD-10-CM | POA: Diagnosis not present

## 2014-03-30 DIAGNOSIS — E662 Morbid (severe) obesity with alveolar hypoventilation: Secondary | ICD-10-CM | POA: Diagnosis not present

## 2014-03-30 DIAGNOSIS — E109 Type 1 diabetes mellitus without complications: Secondary | ICD-10-CM | POA: Diagnosis not present

## 2014-03-30 DIAGNOSIS — J9610 Chronic respiratory failure, unspecified whether with hypoxia or hypercapnia: Secondary | ICD-10-CM | POA: Diagnosis not present

## 2014-03-30 DIAGNOSIS — R296 Repeated falls: Secondary | ICD-10-CM | POA: Diagnosis not present

## 2014-03-30 DIAGNOSIS — M6281 Muscle weakness (generalized): Secondary | ICD-10-CM | POA: Diagnosis not present

## 2014-03-30 DIAGNOSIS — I10 Essential (primary) hypertension: Secondary | ICD-10-CM | POA: Diagnosis not present

## 2014-03-30 DIAGNOSIS — F419 Anxiety disorder, unspecified: Secondary | ICD-10-CM | POA: Diagnosis not present

## 2014-03-30 DIAGNOSIS — E785 Hyperlipidemia, unspecified: Secondary | ICD-10-CM | POA: Diagnosis not present

## 2014-03-30 DIAGNOSIS — J9622 Acute and chronic respiratory failure with hypercapnia: Secondary | ICD-10-CM | POA: Diagnosis not present

## 2014-03-30 DIAGNOSIS — Z9911 Dependence on respirator [ventilator] status: Secondary | ICD-10-CM | POA: Diagnosis not present

## 2014-03-30 DIAGNOSIS — J45909 Unspecified asthma, uncomplicated: Secondary | ICD-10-CM | POA: Diagnosis not present

## 2014-03-30 DIAGNOSIS — G9009 Other idiopathic peripheral autonomic neuropathy: Secondary | ICD-10-CM | POA: Diagnosis not present

## 2014-03-31 DIAGNOSIS — Z86711 Personal history of pulmonary embolism: Secondary | ICD-10-CM | POA: Diagnosis not present

## 2014-03-31 DIAGNOSIS — E785 Hyperlipidemia, unspecified: Secondary | ICD-10-CM | POA: Diagnosis not present

## 2014-03-31 DIAGNOSIS — G9009 Other idiopathic peripheral autonomic neuropathy: Secondary | ICD-10-CM | POA: Diagnosis not present

## 2014-03-31 DIAGNOSIS — F419 Anxiety disorder, unspecified: Secondary | ICD-10-CM | POA: Diagnosis not present

## 2014-03-31 DIAGNOSIS — I10 Essential (primary) hypertension: Secondary | ICD-10-CM | POA: Diagnosis not present

## 2014-03-31 DIAGNOSIS — Z5181 Encounter for therapeutic drug level monitoring: Secondary | ICD-10-CM | POA: Diagnosis not present

## 2014-03-31 DIAGNOSIS — M6281 Muscle weakness (generalized): Secondary | ICD-10-CM | POA: Diagnosis not present

## 2014-03-31 DIAGNOSIS — J9622 Acute and chronic respiratory failure with hypercapnia: Secondary | ICD-10-CM | POA: Diagnosis not present

## 2014-03-31 DIAGNOSIS — Z9911 Dependence on respirator [ventilator] status: Secondary | ICD-10-CM | POA: Diagnosis not present

## 2014-03-31 DIAGNOSIS — J45909 Unspecified asthma, uncomplicated: Secondary | ICD-10-CM | POA: Diagnosis not present

## 2014-03-31 DIAGNOSIS — J9610 Chronic respiratory failure, unspecified whether with hypoxia or hypercapnia: Secondary | ICD-10-CM | POA: Diagnosis not present

## 2014-03-31 DIAGNOSIS — E662 Morbid (severe) obesity with alveolar hypoventilation: Secondary | ICD-10-CM | POA: Diagnosis not present

## 2014-03-31 DIAGNOSIS — E108 Type 1 diabetes mellitus with unspecified complications: Secondary | ICD-10-CM | POA: Diagnosis not present

## 2014-03-31 DIAGNOSIS — M170 Bilateral primary osteoarthritis of knee: Secondary | ICD-10-CM | POA: Diagnosis not present

## 2014-03-31 DIAGNOSIS — R296 Repeated falls: Secondary | ICD-10-CM | POA: Diagnosis not present

## 2014-03-31 DIAGNOSIS — E109 Type 1 diabetes mellitus without complications: Secondary | ICD-10-CM | POA: Diagnosis not present

## 2014-04-01 DIAGNOSIS — J45909 Unspecified asthma, uncomplicated: Secondary | ICD-10-CM | POA: Diagnosis not present

## 2014-04-01 DIAGNOSIS — F419 Anxiety disorder, unspecified: Secondary | ICD-10-CM | POA: Diagnosis not present

## 2014-04-01 DIAGNOSIS — J9622 Acute and chronic respiratory failure with hypercapnia: Secondary | ICD-10-CM | POA: Diagnosis not present

## 2014-04-01 DIAGNOSIS — G9009 Other idiopathic peripheral autonomic neuropathy: Secondary | ICD-10-CM | POA: Diagnosis not present

## 2014-04-01 DIAGNOSIS — R296 Repeated falls: Secondary | ICD-10-CM | POA: Diagnosis not present

## 2014-04-01 DIAGNOSIS — J9610 Chronic respiratory failure, unspecified whether with hypoxia or hypercapnia: Secondary | ICD-10-CM | POA: Diagnosis not present

## 2014-04-01 DIAGNOSIS — E662 Morbid (severe) obesity with alveolar hypoventilation: Secondary | ICD-10-CM | POA: Diagnosis not present

## 2014-04-01 DIAGNOSIS — E109 Type 1 diabetes mellitus without complications: Secondary | ICD-10-CM | POA: Diagnosis not present

## 2014-04-01 DIAGNOSIS — M6281 Muscle weakness (generalized): Secondary | ICD-10-CM | POA: Diagnosis not present

## 2014-04-01 DIAGNOSIS — E785 Hyperlipidemia, unspecified: Secondary | ICD-10-CM | POA: Diagnosis not present

## 2014-04-01 DIAGNOSIS — I10 Essential (primary) hypertension: Secondary | ICD-10-CM | POA: Diagnosis not present

## 2014-04-01 DIAGNOSIS — Z9911 Dependence on respirator [ventilator] status: Secondary | ICD-10-CM | POA: Diagnosis not present

## 2014-04-02 DIAGNOSIS — J9622 Acute and chronic respiratory failure with hypercapnia: Secondary | ICD-10-CM | POA: Diagnosis not present

## 2014-04-02 DIAGNOSIS — F419 Anxiety disorder, unspecified: Secondary | ICD-10-CM | POA: Diagnosis not present

## 2014-04-02 DIAGNOSIS — M6281 Muscle weakness (generalized): Secondary | ICD-10-CM | POA: Diagnosis not present

## 2014-04-02 DIAGNOSIS — E662 Morbid (severe) obesity with alveolar hypoventilation: Secondary | ICD-10-CM | POA: Diagnosis not present

## 2014-04-02 DIAGNOSIS — G9009 Other idiopathic peripheral autonomic neuropathy: Secondary | ICD-10-CM | POA: Diagnosis not present

## 2014-04-02 DIAGNOSIS — J9610 Chronic respiratory failure, unspecified whether with hypoxia or hypercapnia: Secondary | ICD-10-CM | POA: Diagnosis not present

## 2014-04-02 DIAGNOSIS — E109 Type 1 diabetes mellitus without complications: Secondary | ICD-10-CM | POA: Diagnosis not present

## 2014-04-02 DIAGNOSIS — I10 Essential (primary) hypertension: Secondary | ICD-10-CM | POA: Diagnosis not present

## 2014-04-02 DIAGNOSIS — R296 Repeated falls: Secondary | ICD-10-CM | POA: Diagnosis not present

## 2014-04-02 DIAGNOSIS — Z9911 Dependence on respirator [ventilator] status: Secondary | ICD-10-CM | POA: Diagnosis not present

## 2014-04-02 DIAGNOSIS — E785 Hyperlipidemia, unspecified: Secondary | ICD-10-CM | POA: Diagnosis not present

## 2014-04-02 DIAGNOSIS — J45909 Unspecified asthma, uncomplicated: Secondary | ICD-10-CM | POA: Diagnosis not present

## 2014-04-03 DIAGNOSIS — J9622 Acute and chronic respiratory failure with hypercapnia: Secondary | ICD-10-CM | POA: Diagnosis not present

## 2014-04-03 DIAGNOSIS — G9009 Other idiopathic peripheral autonomic neuropathy: Secondary | ICD-10-CM | POA: Diagnosis not present

## 2014-04-03 DIAGNOSIS — J45909 Unspecified asthma, uncomplicated: Secondary | ICD-10-CM | POA: Diagnosis not present

## 2014-04-03 DIAGNOSIS — E109 Type 1 diabetes mellitus without complications: Secondary | ICD-10-CM | POA: Diagnosis not present

## 2014-04-03 DIAGNOSIS — Z9911 Dependence on respirator [ventilator] status: Secondary | ICD-10-CM | POA: Diagnosis not present

## 2014-04-03 DIAGNOSIS — I10 Essential (primary) hypertension: Secondary | ICD-10-CM | POA: Diagnosis not present

## 2014-04-03 DIAGNOSIS — R296 Repeated falls: Secondary | ICD-10-CM | POA: Diagnosis not present

## 2014-04-03 DIAGNOSIS — E785 Hyperlipidemia, unspecified: Secondary | ICD-10-CM | POA: Diagnosis not present

## 2014-04-03 DIAGNOSIS — Z86711 Personal history of pulmonary embolism: Secondary | ICD-10-CM | POA: Diagnosis not present

## 2014-04-03 DIAGNOSIS — M170 Bilateral primary osteoarthritis of knee: Secondary | ICD-10-CM | POA: Diagnosis not present

## 2014-04-03 DIAGNOSIS — E662 Morbid (severe) obesity with alveolar hypoventilation: Secondary | ICD-10-CM | POA: Diagnosis not present

## 2014-04-03 DIAGNOSIS — F419 Anxiety disorder, unspecified: Secondary | ICD-10-CM | POA: Diagnosis not present

## 2014-04-03 DIAGNOSIS — M6281 Muscle weakness (generalized): Secondary | ICD-10-CM | POA: Diagnosis not present

## 2014-04-03 DIAGNOSIS — M25562 Pain in left knee: Secondary | ICD-10-CM | POA: Diagnosis not present

## 2014-04-03 DIAGNOSIS — J9610 Chronic respiratory failure, unspecified whether with hypoxia or hypercapnia: Secondary | ICD-10-CM | POA: Diagnosis not present

## 2014-04-03 DIAGNOSIS — Z7901 Long term (current) use of anticoagulants: Secondary | ICD-10-CM | POA: Diagnosis not present

## 2014-04-06 DIAGNOSIS — E662 Morbid (severe) obesity with alveolar hypoventilation: Secondary | ICD-10-CM | POA: Diagnosis not present

## 2014-04-06 DIAGNOSIS — R296 Repeated falls: Secondary | ICD-10-CM | POA: Diagnosis not present

## 2014-04-06 DIAGNOSIS — F419 Anxiety disorder, unspecified: Secondary | ICD-10-CM | POA: Diagnosis not present

## 2014-04-06 DIAGNOSIS — E785 Hyperlipidemia, unspecified: Secondary | ICD-10-CM | POA: Diagnosis not present

## 2014-04-06 DIAGNOSIS — J45909 Unspecified asthma, uncomplicated: Secondary | ICD-10-CM | POA: Diagnosis not present

## 2014-04-06 DIAGNOSIS — Z9911 Dependence on respirator [ventilator] status: Secondary | ICD-10-CM | POA: Diagnosis not present

## 2014-04-06 DIAGNOSIS — E109 Type 1 diabetes mellitus without complications: Secondary | ICD-10-CM | POA: Diagnosis not present

## 2014-04-06 DIAGNOSIS — J9610 Chronic respiratory failure, unspecified whether with hypoxia or hypercapnia: Secondary | ICD-10-CM | POA: Diagnosis not present

## 2014-04-06 DIAGNOSIS — I10 Essential (primary) hypertension: Secondary | ICD-10-CM | POA: Diagnosis not present

## 2014-04-06 DIAGNOSIS — J9622 Acute and chronic respiratory failure with hypercapnia: Secondary | ICD-10-CM | POA: Diagnosis not present

## 2014-04-06 DIAGNOSIS — M170 Bilateral primary osteoarthritis of knee: Secondary | ICD-10-CM | POA: Diagnosis not present

## 2014-04-06 DIAGNOSIS — M6281 Muscle weakness (generalized): Secondary | ICD-10-CM | POA: Diagnosis not present

## 2014-04-06 DIAGNOSIS — G9009 Other idiopathic peripheral autonomic neuropathy: Secondary | ICD-10-CM | POA: Diagnosis not present

## 2014-04-06 DIAGNOSIS — E108 Type 1 diabetes mellitus with unspecified complications: Secondary | ICD-10-CM | POA: Diagnosis not present

## 2014-04-07 DIAGNOSIS — M6281 Muscle weakness (generalized): Secondary | ICD-10-CM | POA: Diagnosis not present

## 2014-04-07 DIAGNOSIS — Z9911 Dependence on respirator [ventilator] status: Secondary | ICD-10-CM | POA: Diagnosis not present

## 2014-04-07 DIAGNOSIS — J45909 Unspecified asthma, uncomplicated: Secondary | ICD-10-CM | POA: Diagnosis not present

## 2014-04-07 DIAGNOSIS — E109 Type 1 diabetes mellitus without complications: Secondary | ICD-10-CM | POA: Diagnosis not present

## 2014-04-07 DIAGNOSIS — E785 Hyperlipidemia, unspecified: Secondary | ICD-10-CM | POA: Diagnosis not present

## 2014-04-07 DIAGNOSIS — R296 Repeated falls: Secondary | ICD-10-CM | POA: Diagnosis not present

## 2014-04-07 DIAGNOSIS — J9610 Chronic respiratory failure, unspecified whether with hypoxia or hypercapnia: Secondary | ICD-10-CM | POA: Diagnosis not present

## 2014-04-07 DIAGNOSIS — I10 Essential (primary) hypertension: Secondary | ICD-10-CM | POA: Diagnosis not present

## 2014-04-07 DIAGNOSIS — J9622 Acute and chronic respiratory failure with hypercapnia: Secondary | ICD-10-CM | POA: Diagnosis not present

## 2014-04-07 DIAGNOSIS — F419 Anxiety disorder, unspecified: Secondary | ICD-10-CM | POA: Diagnosis not present

## 2014-04-07 DIAGNOSIS — E662 Morbid (severe) obesity with alveolar hypoventilation: Secondary | ICD-10-CM | POA: Diagnosis not present

## 2014-04-07 DIAGNOSIS — G9009 Other idiopathic peripheral autonomic neuropathy: Secondary | ICD-10-CM | POA: Diagnosis not present

## 2014-04-08 DIAGNOSIS — J9622 Acute and chronic respiratory failure with hypercapnia: Secondary | ICD-10-CM | POA: Diagnosis not present

## 2014-04-08 DIAGNOSIS — J9610 Chronic respiratory failure, unspecified whether with hypoxia or hypercapnia: Secondary | ICD-10-CM | POA: Diagnosis not present

## 2014-04-08 DIAGNOSIS — G9009 Other idiopathic peripheral autonomic neuropathy: Secondary | ICD-10-CM | POA: Diagnosis not present

## 2014-04-08 DIAGNOSIS — J45909 Unspecified asthma, uncomplicated: Secondary | ICD-10-CM | POA: Diagnosis not present

## 2014-04-08 DIAGNOSIS — E109 Type 1 diabetes mellitus without complications: Secondary | ICD-10-CM | POA: Diagnosis not present

## 2014-04-08 DIAGNOSIS — Z9911 Dependence on respirator [ventilator] status: Secondary | ICD-10-CM | POA: Diagnosis not present

## 2014-04-08 DIAGNOSIS — I10 Essential (primary) hypertension: Secondary | ICD-10-CM | POA: Diagnosis not present

## 2014-04-08 DIAGNOSIS — R296 Repeated falls: Secondary | ICD-10-CM | POA: Diagnosis not present

## 2014-04-08 DIAGNOSIS — F419 Anxiety disorder, unspecified: Secondary | ICD-10-CM | POA: Diagnosis not present

## 2014-04-08 DIAGNOSIS — M6281 Muscle weakness (generalized): Secondary | ICD-10-CM | POA: Diagnosis not present

## 2014-04-08 DIAGNOSIS — E785 Hyperlipidemia, unspecified: Secondary | ICD-10-CM | POA: Diagnosis not present

## 2014-04-08 DIAGNOSIS — E662 Morbid (severe) obesity with alveolar hypoventilation: Secondary | ICD-10-CM | POA: Diagnosis not present

## 2014-04-09 DIAGNOSIS — J9622 Acute and chronic respiratory failure with hypercapnia: Secondary | ICD-10-CM | POA: Diagnosis not present

## 2014-04-09 DIAGNOSIS — J9610 Chronic respiratory failure, unspecified whether with hypoxia or hypercapnia: Secondary | ICD-10-CM | POA: Diagnosis not present

## 2014-04-09 DIAGNOSIS — R791 Abnormal coagulation profile: Secondary | ICD-10-CM | POA: Diagnosis not present

## 2014-04-09 DIAGNOSIS — J45909 Unspecified asthma, uncomplicated: Secondary | ICD-10-CM | POA: Diagnosis not present

## 2014-04-09 DIAGNOSIS — Z9911 Dependence on respirator [ventilator] status: Secondary | ICD-10-CM | POA: Diagnosis not present

## 2014-04-09 DIAGNOSIS — F419 Anxiety disorder, unspecified: Secondary | ICD-10-CM | POA: Diagnosis not present

## 2014-04-09 DIAGNOSIS — G9009 Other idiopathic peripheral autonomic neuropathy: Secondary | ICD-10-CM | POA: Diagnosis not present

## 2014-04-09 DIAGNOSIS — E785 Hyperlipidemia, unspecified: Secondary | ICD-10-CM | POA: Diagnosis not present

## 2014-04-09 DIAGNOSIS — M6281 Muscle weakness (generalized): Secondary | ICD-10-CM | POA: Diagnosis not present

## 2014-04-09 DIAGNOSIS — E109 Type 1 diabetes mellitus without complications: Secondary | ICD-10-CM | POA: Diagnosis not present

## 2014-04-09 DIAGNOSIS — R296 Repeated falls: Secondary | ICD-10-CM | POA: Diagnosis not present

## 2014-04-09 DIAGNOSIS — I10 Essential (primary) hypertension: Secondary | ICD-10-CM | POA: Diagnosis not present

## 2014-04-09 DIAGNOSIS — E662 Morbid (severe) obesity with alveolar hypoventilation: Secondary | ICD-10-CM | POA: Diagnosis not present

## 2014-04-10 DIAGNOSIS — J9622 Acute and chronic respiratory failure with hypercapnia: Secondary | ICD-10-CM | POA: Diagnosis not present

## 2014-04-10 DIAGNOSIS — M6281 Muscle weakness (generalized): Secondary | ICD-10-CM | POA: Diagnosis not present

## 2014-04-10 DIAGNOSIS — J45909 Unspecified asthma, uncomplicated: Secondary | ICD-10-CM | POA: Diagnosis not present

## 2014-04-10 DIAGNOSIS — E109 Type 1 diabetes mellitus without complications: Secondary | ICD-10-CM | POA: Diagnosis not present

## 2014-04-10 DIAGNOSIS — F419 Anxiety disorder, unspecified: Secondary | ICD-10-CM | POA: Diagnosis not present

## 2014-04-10 DIAGNOSIS — R296 Repeated falls: Secondary | ICD-10-CM | POA: Diagnosis not present

## 2014-04-10 DIAGNOSIS — Z9911 Dependence on respirator [ventilator] status: Secondary | ICD-10-CM | POA: Diagnosis not present

## 2014-04-10 DIAGNOSIS — G9009 Other idiopathic peripheral autonomic neuropathy: Secondary | ICD-10-CM | POA: Diagnosis not present

## 2014-04-10 DIAGNOSIS — E785 Hyperlipidemia, unspecified: Secondary | ICD-10-CM | POA: Diagnosis not present

## 2014-04-10 DIAGNOSIS — E662 Morbid (severe) obesity with alveolar hypoventilation: Secondary | ICD-10-CM | POA: Diagnosis not present

## 2014-04-10 DIAGNOSIS — J9610 Chronic respiratory failure, unspecified whether with hypoxia or hypercapnia: Secondary | ICD-10-CM | POA: Diagnosis not present

## 2014-04-10 DIAGNOSIS — I10 Essential (primary) hypertension: Secondary | ICD-10-CM | POA: Diagnosis not present

## 2014-04-13 DIAGNOSIS — J9622 Acute and chronic respiratory failure with hypercapnia: Secondary | ICD-10-CM | POA: Diagnosis not present

## 2014-04-13 DIAGNOSIS — G9009 Other idiopathic peripheral autonomic neuropathy: Secondary | ICD-10-CM | POA: Diagnosis not present

## 2014-04-13 DIAGNOSIS — Z9911 Dependence on respirator [ventilator] status: Secondary | ICD-10-CM | POA: Diagnosis not present

## 2014-04-13 DIAGNOSIS — E785 Hyperlipidemia, unspecified: Secondary | ICD-10-CM | POA: Diagnosis not present

## 2014-04-13 DIAGNOSIS — I2699 Other pulmonary embolism without acute cor pulmonale: Secondary | ICD-10-CM | POA: Diagnosis not present

## 2014-04-13 DIAGNOSIS — R296 Repeated falls: Secondary | ICD-10-CM | POA: Diagnosis not present

## 2014-04-13 DIAGNOSIS — M6281 Muscle weakness (generalized): Secondary | ICD-10-CM | POA: Diagnosis not present

## 2014-04-13 DIAGNOSIS — J45909 Unspecified asthma, uncomplicated: Secondary | ICD-10-CM | POA: Diagnosis not present

## 2014-04-13 DIAGNOSIS — I10 Essential (primary) hypertension: Secondary | ICD-10-CM | POA: Diagnosis not present

## 2014-04-13 DIAGNOSIS — J9610 Chronic respiratory failure, unspecified whether with hypoxia or hypercapnia: Secondary | ICD-10-CM | POA: Diagnosis not present

## 2014-04-13 DIAGNOSIS — E662 Morbid (severe) obesity with alveolar hypoventilation: Secondary | ICD-10-CM | POA: Diagnosis not present

## 2014-04-13 DIAGNOSIS — E109 Type 1 diabetes mellitus without complications: Secondary | ICD-10-CM | POA: Diagnosis not present

## 2014-04-13 DIAGNOSIS — F419 Anxiety disorder, unspecified: Secondary | ICD-10-CM | POA: Diagnosis not present

## 2014-04-14 DIAGNOSIS — J9610 Chronic respiratory failure, unspecified whether with hypoxia or hypercapnia: Secondary | ICD-10-CM | POA: Diagnosis not present

## 2014-04-14 DIAGNOSIS — E108 Type 1 diabetes mellitus with unspecified complications: Secondary | ICD-10-CM | POA: Diagnosis not present

## 2014-04-14 DIAGNOSIS — E662 Morbid (severe) obesity with alveolar hypoventilation: Secondary | ICD-10-CM | POA: Diagnosis not present

## 2014-04-14 DIAGNOSIS — Z9911 Dependence on respirator [ventilator] status: Secondary | ICD-10-CM | POA: Diagnosis not present

## 2014-04-14 DIAGNOSIS — I10 Essential (primary) hypertension: Secondary | ICD-10-CM | POA: Diagnosis not present

## 2014-04-14 DIAGNOSIS — G9009 Other idiopathic peripheral autonomic neuropathy: Secondary | ICD-10-CM | POA: Diagnosis not present

## 2014-04-14 DIAGNOSIS — J45909 Unspecified asthma, uncomplicated: Secondary | ICD-10-CM | POA: Diagnosis not present

## 2014-04-14 DIAGNOSIS — R296 Repeated falls: Secondary | ICD-10-CM | POA: Diagnosis not present

## 2014-04-14 DIAGNOSIS — M6281 Muscle weakness (generalized): Secondary | ICD-10-CM | POA: Diagnosis not present

## 2014-04-14 DIAGNOSIS — F419 Anxiety disorder, unspecified: Secondary | ICD-10-CM | POA: Diagnosis not present

## 2014-04-14 DIAGNOSIS — M25562 Pain in left knee: Secondary | ICD-10-CM | POA: Diagnosis not present

## 2014-04-14 DIAGNOSIS — E109 Type 1 diabetes mellitus without complications: Secondary | ICD-10-CM | POA: Diagnosis not present

## 2014-04-14 DIAGNOSIS — J9622 Acute and chronic respiratory failure with hypercapnia: Secondary | ICD-10-CM | POA: Diagnosis not present

## 2014-04-14 DIAGNOSIS — G8929 Other chronic pain: Secondary | ICD-10-CM | POA: Diagnosis not present

## 2014-04-14 DIAGNOSIS — M170 Bilateral primary osteoarthritis of knee: Secondary | ICD-10-CM | POA: Diagnosis not present

## 2014-04-14 DIAGNOSIS — E785 Hyperlipidemia, unspecified: Secondary | ICD-10-CM | POA: Diagnosis not present

## 2014-04-15 DIAGNOSIS — J9622 Acute and chronic respiratory failure with hypercapnia: Secondary | ICD-10-CM | POA: Diagnosis not present

## 2014-04-15 DIAGNOSIS — I10 Essential (primary) hypertension: Secondary | ICD-10-CM | POA: Diagnosis not present

## 2014-04-15 DIAGNOSIS — M6281 Muscle weakness (generalized): Secondary | ICD-10-CM | POA: Diagnosis not present

## 2014-04-15 DIAGNOSIS — J45909 Unspecified asthma, uncomplicated: Secondary | ICD-10-CM | POA: Diagnosis not present

## 2014-04-15 DIAGNOSIS — F419 Anxiety disorder, unspecified: Secondary | ICD-10-CM | POA: Diagnosis not present

## 2014-04-15 DIAGNOSIS — J9610 Chronic respiratory failure, unspecified whether with hypoxia or hypercapnia: Secondary | ICD-10-CM | POA: Diagnosis not present

## 2014-04-15 DIAGNOSIS — E785 Hyperlipidemia, unspecified: Secondary | ICD-10-CM | POA: Diagnosis not present

## 2014-04-15 DIAGNOSIS — R296 Repeated falls: Secondary | ICD-10-CM | POA: Diagnosis not present

## 2014-04-15 DIAGNOSIS — E109 Type 1 diabetes mellitus without complications: Secondary | ICD-10-CM | POA: Diagnosis not present

## 2014-04-15 DIAGNOSIS — E662 Morbid (severe) obesity with alveolar hypoventilation: Secondary | ICD-10-CM | POA: Diagnosis not present

## 2014-04-15 DIAGNOSIS — Z9911 Dependence on respirator [ventilator] status: Secondary | ICD-10-CM | POA: Diagnosis not present

## 2014-04-15 DIAGNOSIS — G9009 Other idiopathic peripheral autonomic neuropathy: Secondary | ICD-10-CM | POA: Diagnosis not present

## 2014-04-16 DIAGNOSIS — R296 Repeated falls: Secondary | ICD-10-CM | POA: Diagnosis not present

## 2014-04-16 DIAGNOSIS — J45909 Unspecified asthma, uncomplicated: Secondary | ICD-10-CM | POA: Diagnosis not present

## 2014-04-16 DIAGNOSIS — J9610 Chronic respiratory failure, unspecified whether with hypoxia or hypercapnia: Secondary | ICD-10-CM | POA: Diagnosis not present

## 2014-04-16 DIAGNOSIS — M6281 Muscle weakness (generalized): Secondary | ICD-10-CM | POA: Diagnosis not present

## 2014-04-16 DIAGNOSIS — J9622 Acute and chronic respiratory failure with hypercapnia: Secondary | ICD-10-CM | POA: Diagnosis not present

## 2014-04-16 DIAGNOSIS — G9009 Other idiopathic peripheral autonomic neuropathy: Secondary | ICD-10-CM | POA: Diagnosis not present

## 2014-04-16 DIAGNOSIS — Z9911 Dependence on respirator [ventilator] status: Secondary | ICD-10-CM | POA: Diagnosis not present

## 2014-04-16 DIAGNOSIS — E785 Hyperlipidemia, unspecified: Secondary | ICD-10-CM | POA: Diagnosis not present

## 2014-04-16 DIAGNOSIS — F419 Anxiety disorder, unspecified: Secondary | ICD-10-CM | POA: Diagnosis not present

## 2014-04-16 DIAGNOSIS — E109 Type 1 diabetes mellitus without complications: Secondary | ICD-10-CM | POA: Diagnosis not present

## 2014-04-16 DIAGNOSIS — I10 Essential (primary) hypertension: Secondary | ICD-10-CM | POA: Diagnosis not present

## 2014-04-16 DIAGNOSIS — E662 Morbid (severe) obesity with alveolar hypoventilation: Secondary | ICD-10-CM | POA: Diagnosis not present

## 2014-04-17 DIAGNOSIS — R05 Cough: Secondary | ICD-10-CM | POA: Diagnosis not present

## 2014-04-17 DIAGNOSIS — M6281 Muscle weakness (generalized): Secondary | ICD-10-CM | POA: Diagnosis not present

## 2014-04-17 DIAGNOSIS — G9009 Other idiopathic peripheral autonomic neuropathy: Secondary | ICD-10-CM | POA: Diagnosis not present

## 2014-04-17 DIAGNOSIS — R296 Repeated falls: Secondary | ICD-10-CM | POA: Diagnosis not present

## 2014-04-17 DIAGNOSIS — J45909 Unspecified asthma, uncomplicated: Secondary | ICD-10-CM | POA: Diagnosis not present

## 2014-04-17 DIAGNOSIS — E662 Morbid (severe) obesity with alveolar hypoventilation: Secondary | ICD-10-CM | POA: Diagnosis not present

## 2014-04-17 DIAGNOSIS — E109 Type 1 diabetes mellitus without complications: Secondary | ICD-10-CM | POA: Diagnosis not present

## 2014-04-17 DIAGNOSIS — F419 Anxiety disorder, unspecified: Secondary | ICD-10-CM | POA: Diagnosis not present

## 2014-04-17 DIAGNOSIS — I10 Essential (primary) hypertension: Secondary | ICD-10-CM | POA: Diagnosis not present

## 2014-04-17 DIAGNOSIS — J9622 Acute and chronic respiratory failure with hypercapnia: Secondary | ICD-10-CM | POA: Diagnosis not present

## 2014-04-17 DIAGNOSIS — J9610 Chronic respiratory failure, unspecified whether with hypoxia or hypercapnia: Secondary | ICD-10-CM | POA: Diagnosis not present

## 2014-04-17 DIAGNOSIS — R269 Unspecified abnormalities of gait and mobility: Secondary | ICD-10-CM | POA: Diagnosis not present

## 2014-04-17 DIAGNOSIS — Z9911 Dependence on respirator [ventilator] status: Secondary | ICD-10-CM | POA: Diagnosis not present

## 2014-04-17 DIAGNOSIS — E785 Hyperlipidemia, unspecified: Secondary | ICD-10-CM | POA: Diagnosis not present

## 2014-04-20 DIAGNOSIS — J9610 Chronic respiratory failure, unspecified whether with hypoxia or hypercapnia: Secondary | ICD-10-CM | POA: Diagnosis not present

## 2014-04-20 DIAGNOSIS — I10 Essential (primary) hypertension: Secondary | ICD-10-CM | POA: Diagnosis not present

## 2014-04-20 DIAGNOSIS — G9009 Other idiopathic peripheral autonomic neuropathy: Secondary | ICD-10-CM | POA: Diagnosis not present

## 2014-04-20 DIAGNOSIS — R296 Repeated falls: Secondary | ICD-10-CM | POA: Diagnosis not present

## 2014-04-20 DIAGNOSIS — E109 Type 1 diabetes mellitus without complications: Secondary | ICD-10-CM | POA: Diagnosis not present

## 2014-04-20 DIAGNOSIS — R269 Unspecified abnormalities of gait and mobility: Secondary | ICD-10-CM | POA: Diagnosis not present

## 2014-04-20 DIAGNOSIS — J9622 Acute and chronic respiratory failure with hypercapnia: Secondary | ICD-10-CM | POA: Diagnosis not present

## 2014-04-20 DIAGNOSIS — I4891 Unspecified atrial fibrillation: Secondary | ICD-10-CM | POA: Diagnosis not present

## 2014-04-20 DIAGNOSIS — Z9911 Dependence on respirator [ventilator] status: Secondary | ICD-10-CM | POA: Diagnosis not present

## 2014-04-20 DIAGNOSIS — F419 Anxiety disorder, unspecified: Secondary | ICD-10-CM | POA: Diagnosis not present

## 2014-04-20 DIAGNOSIS — E662 Morbid (severe) obesity with alveolar hypoventilation: Secondary | ICD-10-CM | POA: Diagnosis not present

## 2014-04-20 DIAGNOSIS — J45909 Unspecified asthma, uncomplicated: Secondary | ICD-10-CM | POA: Diagnosis not present

## 2014-04-20 DIAGNOSIS — M6281 Muscle weakness (generalized): Secondary | ICD-10-CM | POA: Diagnosis not present

## 2014-04-20 DIAGNOSIS — I2699 Other pulmonary embolism without acute cor pulmonale: Secondary | ICD-10-CM | POA: Diagnosis not present

## 2014-04-20 DIAGNOSIS — E785 Hyperlipidemia, unspecified: Secondary | ICD-10-CM | POA: Diagnosis not present

## 2014-04-20 DIAGNOSIS — M25561 Pain in right knee: Secondary | ICD-10-CM | POA: Diagnosis not present

## 2014-04-20 DIAGNOSIS — Z5181 Encounter for therapeutic drug level monitoring: Secondary | ICD-10-CM | POA: Diagnosis not present

## 2014-04-21 DIAGNOSIS — E662 Morbid (severe) obesity with alveolar hypoventilation: Secondary | ICD-10-CM | POA: Diagnosis not present

## 2014-04-21 DIAGNOSIS — G9009 Other idiopathic peripheral autonomic neuropathy: Secondary | ICD-10-CM | POA: Diagnosis not present

## 2014-04-21 DIAGNOSIS — E109 Type 1 diabetes mellitus without complications: Secondary | ICD-10-CM | POA: Diagnosis not present

## 2014-04-21 DIAGNOSIS — E785 Hyperlipidemia, unspecified: Secondary | ICD-10-CM | POA: Diagnosis not present

## 2014-04-21 DIAGNOSIS — R269 Unspecified abnormalities of gait and mobility: Secondary | ICD-10-CM | POA: Diagnosis not present

## 2014-04-21 DIAGNOSIS — Z9911 Dependence on respirator [ventilator] status: Secondary | ICD-10-CM | POA: Diagnosis not present

## 2014-04-21 DIAGNOSIS — R296 Repeated falls: Secondary | ICD-10-CM | POA: Diagnosis not present

## 2014-04-21 DIAGNOSIS — I10 Essential (primary) hypertension: Secondary | ICD-10-CM | POA: Diagnosis not present

## 2014-04-21 DIAGNOSIS — J45909 Unspecified asthma, uncomplicated: Secondary | ICD-10-CM | POA: Diagnosis not present

## 2014-04-21 DIAGNOSIS — F419 Anxiety disorder, unspecified: Secondary | ICD-10-CM | POA: Diagnosis not present

## 2014-04-21 DIAGNOSIS — J9622 Acute and chronic respiratory failure with hypercapnia: Secondary | ICD-10-CM | POA: Diagnosis not present

## 2014-04-21 DIAGNOSIS — M6281 Muscle weakness (generalized): Secondary | ICD-10-CM | POA: Diagnosis not present

## 2014-04-21 DIAGNOSIS — J9610 Chronic respiratory failure, unspecified whether with hypoxia or hypercapnia: Secondary | ICD-10-CM | POA: Diagnosis not present

## 2014-04-22 DIAGNOSIS — J45909 Unspecified asthma, uncomplicated: Secondary | ICD-10-CM | POA: Diagnosis not present

## 2014-04-22 DIAGNOSIS — F419 Anxiety disorder, unspecified: Secondary | ICD-10-CM | POA: Diagnosis not present

## 2014-04-22 DIAGNOSIS — E662 Morbid (severe) obesity with alveolar hypoventilation: Secondary | ICD-10-CM | POA: Diagnosis not present

## 2014-04-22 DIAGNOSIS — Z9911 Dependence on respirator [ventilator] status: Secondary | ICD-10-CM | POA: Diagnosis not present

## 2014-04-22 DIAGNOSIS — J9610 Chronic respiratory failure, unspecified whether with hypoxia or hypercapnia: Secondary | ICD-10-CM | POA: Diagnosis not present

## 2014-04-22 DIAGNOSIS — E109 Type 1 diabetes mellitus without complications: Secondary | ICD-10-CM | POA: Diagnosis not present

## 2014-04-22 DIAGNOSIS — G9009 Other idiopathic peripheral autonomic neuropathy: Secondary | ICD-10-CM | POA: Diagnosis not present

## 2014-04-22 DIAGNOSIS — R296 Repeated falls: Secondary | ICD-10-CM | POA: Diagnosis not present

## 2014-04-22 DIAGNOSIS — I10 Essential (primary) hypertension: Secondary | ICD-10-CM | POA: Diagnosis not present

## 2014-04-22 DIAGNOSIS — J9622 Acute and chronic respiratory failure with hypercapnia: Secondary | ICD-10-CM | POA: Diagnosis not present

## 2014-04-22 DIAGNOSIS — M6281 Muscle weakness (generalized): Secondary | ICD-10-CM | POA: Diagnosis not present

## 2014-04-22 DIAGNOSIS — E785 Hyperlipidemia, unspecified: Secondary | ICD-10-CM | POA: Diagnosis not present

## 2014-04-22 DIAGNOSIS — R269 Unspecified abnormalities of gait and mobility: Secondary | ICD-10-CM | POA: Diagnosis not present

## 2014-04-23 DIAGNOSIS — J9622 Acute and chronic respiratory failure with hypercapnia: Secondary | ICD-10-CM | POA: Diagnosis not present

## 2014-04-23 DIAGNOSIS — M6281 Muscle weakness (generalized): Secondary | ICD-10-CM | POA: Diagnosis not present

## 2014-04-23 DIAGNOSIS — R269 Unspecified abnormalities of gait and mobility: Secondary | ICD-10-CM | POA: Diagnosis not present

## 2014-04-23 DIAGNOSIS — J45909 Unspecified asthma, uncomplicated: Secondary | ICD-10-CM | POA: Diagnosis not present

## 2014-04-23 DIAGNOSIS — I10 Essential (primary) hypertension: Secondary | ICD-10-CM | POA: Diagnosis not present

## 2014-04-23 DIAGNOSIS — E109 Type 1 diabetes mellitus without complications: Secondary | ICD-10-CM | POA: Diagnosis not present

## 2014-04-23 DIAGNOSIS — J9610 Chronic respiratory failure, unspecified whether with hypoxia or hypercapnia: Secondary | ICD-10-CM | POA: Diagnosis not present

## 2014-04-23 DIAGNOSIS — G9009 Other idiopathic peripheral autonomic neuropathy: Secondary | ICD-10-CM | POA: Diagnosis not present

## 2014-04-23 DIAGNOSIS — E785 Hyperlipidemia, unspecified: Secondary | ICD-10-CM | POA: Diagnosis not present

## 2014-04-23 DIAGNOSIS — E662 Morbid (severe) obesity with alveolar hypoventilation: Secondary | ICD-10-CM | POA: Diagnosis not present

## 2014-04-23 DIAGNOSIS — Z9911 Dependence on respirator [ventilator] status: Secondary | ICD-10-CM | POA: Diagnosis not present

## 2014-04-23 DIAGNOSIS — F419 Anxiety disorder, unspecified: Secondary | ICD-10-CM | POA: Diagnosis not present

## 2014-04-23 DIAGNOSIS — R296 Repeated falls: Secondary | ICD-10-CM | POA: Diagnosis not present

## 2014-04-24 DIAGNOSIS — J9622 Acute and chronic respiratory failure with hypercapnia: Secondary | ICD-10-CM | POA: Diagnosis not present

## 2014-04-24 DIAGNOSIS — J45909 Unspecified asthma, uncomplicated: Secondary | ICD-10-CM | POA: Diagnosis not present

## 2014-04-24 DIAGNOSIS — E662 Morbid (severe) obesity with alveolar hypoventilation: Secondary | ICD-10-CM | POA: Diagnosis not present

## 2014-04-24 DIAGNOSIS — F419 Anxiety disorder, unspecified: Secondary | ICD-10-CM | POA: Diagnosis not present

## 2014-04-24 DIAGNOSIS — E109 Type 1 diabetes mellitus without complications: Secondary | ICD-10-CM | POA: Diagnosis not present

## 2014-04-24 DIAGNOSIS — I10 Essential (primary) hypertension: Secondary | ICD-10-CM | POA: Diagnosis not present

## 2014-04-24 DIAGNOSIS — R269 Unspecified abnormalities of gait and mobility: Secondary | ICD-10-CM | POA: Diagnosis not present

## 2014-04-24 DIAGNOSIS — Z9911 Dependence on respirator [ventilator] status: Secondary | ICD-10-CM | POA: Diagnosis not present

## 2014-04-24 DIAGNOSIS — R296 Repeated falls: Secondary | ICD-10-CM | POA: Diagnosis not present

## 2014-04-24 DIAGNOSIS — G9009 Other idiopathic peripheral autonomic neuropathy: Secondary | ICD-10-CM | POA: Diagnosis not present

## 2014-04-24 DIAGNOSIS — J9610 Chronic respiratory failure, unspecified whether with hypoxia or hypercapnia: Secondary | ICD-10-CM | POA: Diagnosis not present

## 2014-04-24 DIAGNOSIS — M6281 Muscle weakness (generalized): Secondary | ICD-10-CM | POA: Diagnosis not present

## 2014-04-24 DIAGNOSIS — E785 Hyperlipidemia, unspecified: Secondary | ICD-10-CM | POA: Diagnosis not present

## 2014-04-27 DIAGNOSIS — F419 Anxiety disorder, unspecified: Secondary | ICD-10-CM | POA: Diagnosis not present

## 2014-04-27 DIAGNOSIS — Z9911 Dependence on respirator [ventilator] status: Secondary | ICD-10-CM | POA: Diagnosis not present

## 2014-04-27 DIAGNOSIS — J9622 Acute and chronic respiratory failure with hypercapnia: Secondary | ICD-10-CM | POA: Diagnosis not present

## 2014-04-27 DIAGNOSIS — E662 Morbid (severe) obesity with alveolar hypoventilation: Secondary | ICD-10-CM | POA: Diagnosis not present

## 2014-04-27 DIAGNOSIS — J9610 Chronic respiratory failure, unspecified whether with hypoxia or hypercapnia: Secondary | ICD-10-CM | POA: Diagnosis not present

## 2014-04-27 DIAGNOSIS — M6281 Muscle weakness (generalized): Secondary | ICD-10-CM | POA: Diagnosis not present

## 2014-04-27 DIAGNOSIS — J45909 Unspecified asthma, uncomplicated: Secondary | ICD-10-CM | POA: Diagnosis not present

## 2014-04-27 DIAGNOSIS — G9009 Other idiopathic peripheral autonomic neuropathy: Secondary | ICD-10-CM | POA: Diagnosis not present

## 2014-04-27 DIAGNOSIS — E109 Type 1 diabetes mellitus without complications: Secondary | ICD-10-CM | POA: Diagnosis not present

## 2014-04-27 DIAGNOSIS — E785 Hyperlipidemia, unspecified: Secondary | ICD-10-CM | POA: Diagnosis not present

## 2014-04-27 DIAGNOSIS — R269 Unspecified abnormalities of gait and mobility: Secondary | ICD-10-CM | POA: Diagnosis not present

## 2014-04-27 DIAGNOSIS — I10 Essential (primary) hypertension: Secondary | ICD-10-CM | POA: Diagnosis not present

## 2014-04-27 DIAGNOSIS — R296 Repeated falls: Secondary | ICD-10-CM | POA: Diagnosis not present

## 2014-04-28 DIAGNOSIS — K5900 Constipation, unspecified: Secondary | ICD-10-CM | POA: Diagnosis not present

## 2014-04-28 DIAGNOSIS — E662 Morbid (severe) obesity with alveolar hypoventilation: Secondary | ICD-10-CM | POA: Diagnosis not present

## 2014-04-28 DIAGNOSIS — J9622 Acute and chronic respiratory failure with hypercapnia: Secondary | ICD-10-CM | POA: Diagnosis not present

## 2014-04-28 DIAGNOSIS — M6281 Muscle weakness (generalized): Secondary | ICD-10-CM | POA: Diagnosis not present

## 2014-04-28 DIAGNOSIS — I10 Essential (primary) hypertension: Secondary | ICD-10-CM | POA: Diagnosis not present

## 2014-04-28 DIAGNOSIS — R296 Repeated falls: Secondary | ICD-10-CM | POA: Diagnosis not present

## 2014-04-28 DIAGNOSIS — E109 Type 1 diabetes mellitus without complications: Secondary | ICD-10-CM | POA: Diagnosis not present

## 2014-04-28 DIAGNOSIS — F419 Anxiety disorder, unspecified: Secondary | ICD-10-CM | POA: Diagnosis not present

## 2014-04-28 DIAGNOSIS — J45909 Unspecified asthma, uncomplicated: Secondary | ICD-10-CM | POA: Diagnosis not present

## 2014-04-28 DIAGNOSIS — Z9911 Dependence on respirator [ventilator] status: Secondary | ICD-10-CM | POA: Diagnosis not present

## 2014-04-28 DIAGNOSIS — G9009 Other idiopathic peripheral autonomic neuropathy: Secondary | ICD-10-CM | POA: Diagnosis not present

## 2014-04-28 DIAGNOSIS — R269 Unspecified abnormalities of gait and mobility: Secondary | ICD-10-CM | POA: Diagnosis not present

## 2014-04-28 DIAGNOSIS — E785 Hyperlipidemia, unspecified: Secondary | ICD-10-CM | POA: Diagnosis not present

## 2014-04-28 DIAGNOSIS — J9610 Chronic respiratory failure, unspecified whether with hypoxia or hypercapnia: Secondary | ICD-10-CM | POA: Diagnosis not present

## 2014-04-29 DIAGNOSIS — I509 Heart failure, unspecified: Secondary | ICD-10-CM | POA: Diagnosis not present

## 2014-04-29 DIAGNOSIS — J449 Chronic obstructive pulmonary disease, unspecified: Secondary | ICD-10-CM | POA: Diagnosis not present

## 2014-04-29 DIAGNOSIS — J9610 Chronic respiratory failure, unspecified whether with hypoxia or hypercapnia: Secondary | ICD-10-CM | POA: Diagnosis not present

## 2014-04-29 DIAGNOSIS — Z9911 Dependence on respirator [ventilator] status: Secondary | ICD-10-CM | POA: Diagnosis not present

## 2014-04-29 DIAGNOSIS — E669 Obesity, unspecified: Secondary | ICD-10-CM | POA: Diagnosis not present

## 2014-04-29 DIAGNOSIS — Z930 Tracheostomy status: Secondary | ICD-10-CM | POA: Diagnosis not present

## 2014-04-30 DIAGNOSIS — E669 Obesity, unspecified: Secondary | ICD-10-CM | POA: Diagnosis not present

## 2014-04-30 DIAGNOSIS — I509 Heart failure, unspecified: Secondary | ICD-10-CM | POA: Diagnosis not present

## 2014-05-01 DIAGNOSIS — Z930 Tracheostomy status: Secondary | ICD-10-CM | POA: Diagnosis not present

## 2014-05-01 DIAGNOSIS — E10319 Type 1 diabetes mellitus with unspecified diabetic retinopathy without macular edema: Secondary | ICD-10-CM | POA: Diagnosis not present

## 2014-05-01 DIAGNOSIS — Z7901 Long term (current) use of anticoagulants: Secondary | ICD-10-CM | POA: Diagnosis not present

## 2014-05-01 DIAGNOSIS — Z9181 History of falling: Secondary | ICD-10-CM | POA: Diagnosis not present

## 2014-05-01 DIAGNOSIS — E1043 Type 1 diabetes mellitus with diabetic autonomic (poly)neuropathy: Secondary | ICD-10-CM | POA: Diagnosis not present

## 2014-05-01 DIAGNOSIS — J45909 Unspecified asthma, uncomplicated: Secondary | ICD-10-CM | POA: Diagnosis not present

## 2014-05-01 DIAGNOSIS — Z5181 Encounter for therapeutic drug level monitoring: Secondary | ICD-10-CM | POA: Diagnosis not present

## 2014-05-01 DIAGNOSIS — E785 Hyperlipidemia, unspecified: Secondary | ICD-10-CM | POA: Diagnosis not present

## 2014-05-01 DIAGNOSIS — I5032 Chronic diastolic (congestive) heart failure: Secondary | ICD-10-CM | POA: Diagnosis not present

## 2014-05-01 DIAGNOSIS — M170 Bilateral primary osteoarthritis of knee: Secondary | ICD-10-CM | POA: Diagnosis not present

## 2014-05-01 DIAGNOSIS — I10 Essential (primary) hypertension: Secondary | ICD-10-CM | POA: Diagnosis not present

## 2014-05-01 DIAGNOSIS — F17210 Nicotine dependence, cigarettes, uncomplicated: Secondary | ICD-10-CM | POA: Diagnosis not present

## 2014-05-01 DIAGNOSIS — I2699 Other pulmonary embolism without acute cor pulmonale: Secondary | ICD-10-CM | POA: Diagnosis not present

## 2014-05-01 DIAGNOSIS — J9612 Chronic respiratory failure with hypercapnia: Secondary | ICD-10-CM | POA: Diagnosis not present

## 2014-05-01 DIAGNOSIS — E662 Morbid (severe) obesity with alveolar hypoventilation: Secondary | ICD-10-CM | POA: Diagnosis not present

## 2014-05-01 DIAGNOSIS — D075 Carcinoma in situ of prostate: Secondary | ICD-10-CM | POA: Diagnosis not present

## 2014-05-01 DIAGNOSIS — F419 Anxiety disorder, unspecified: Secondary | ICD-10-CM | POA: Diagnosis not present

## 2014-05-01 DIAGNOSIS — M6281 Muscle weakness (generalized): Secondary | ICD-10-CM | POA: Diagnosis not present

## 2014-05-04 ENCOUNTER — Ambulatory Visit: Payer: Self-pay | Admitting: Endocrinology

## 2014-05-04 ENCOUNTER — Telehealth: Payer: Self-pay | Admitting: Endocrinology

## 2014-05-04 DIAGNOSIS — M170 Bilateral primary osteoarthritis of knee: Secondary | ICD-10-CM | POA: Diagnosis not present

## 2014-05-04 DIAGNOSIS — J9612 Chronic respiratory failure with hypercapnia: Secondary | ICD-10-CM | POA: Diagnosis not present

## 2014-05-04 DIAGNOSIS — E10319 Type 1 diabetes mellitus with unspecified diabetic retinopathy without macular edema: Secondary | ICD-10-CM | POA: Diagnosis not present

## 2014-05-04 DIAGNOSIS — I5032 Chronic diastolic (congestive) heart failure: Secondary | ICD-10-CM | POA: Diagnosis not present

## 2014-05-04 DIAGNOSIS — Z9181 History of falling: Secondary | ICD-10-CM | POA: Diagnosis not present

## 2014-05-04 DIAGNOSIS — J45909 Unspecified asthma, uncomplicated: Secondary | ICD-10-CM | POA: Diagnosis not present

## 2014-05-04 DIAGNOSIS — E662 Morbid (severe) obesity with alveolar hypoventilation: Secondary | ICD-10-CM | POA: Diagnosis not present

## 2014-05-04 DIAGNOSIS — F419 Anxiety disorder, unspecified: Secondary | ICD-10-CM | POA: Diagnosis not present

## 2014-05-04 DIAGNOSIS — Z5181 Encounter for therapeutic drug level monitoring: Secondary | ICD-10-CM | POA: Diagnosis not present

## 2014-05-04 DIAGNOSIS — F17210 Nicotine dependence, cigarettes, uncomplicated: Secondary | ICD-10-CM | POA: Diagnosis not present

## 2014-05-04 DIAGNOSIS — E1043 Type 1 diabetes mellitus with diabetic autonomic (poly)neuropathy: Secondary | ICD-10-CM | POA: Diagnosis not present

## 2014-05-04 DIAGNOSIS — D075 Carcinoma in situ of prostate: Secondary | ICD-10-CM | POA: Diagnosis not present

## 2014-05-04 DIAGNOSIS — E785 Hyperlipidemia, unspecified: Secondary | ICD-10-CM | POA: Diagnosis not present

## 2014-05-04 DIAGNOSIS — Z7901 Long term (current) use of anticoagulants: Secondary | ICD-10-CM | POA: Diagnosis not present

## 2014-05-04 DIAGNOSIS — I2699 Other pulmonary embolism without acute cor pulmonale: Secondary | ICD-10-CM | POA: Diagnosis not present

## 2014-05-04 DIAGNOSIS — I10 Essential (primary) hypertension: Secondary | ICD-10-CM | POA: Diagnosis not present

## 2014-05-04 DIAGNOSIS — Z930 Tracheostomy status: Secondary | ICD-10-CM | POA: Diagnosis not present

## 2014-05-04 DIAGNOSIS — M6281 Muscle weakness (generalized): Secondary | ICD-10-CM | POA: Diagnosis not present

## 2014-05-04 NOTE — Telephone Encounter (Signed)
I contacted Dedra and advised that Dr. Loanne Drilling is currently out of the office and he will return on 05/06/2014. I advised her to fax the orders for the pt and Dr. Loanne Drilling could review before his office visit on Friday 4/22. She voiced understanding and stated we would be receiving the orders.

## 2014-05-04 NOTE — Telephone Encounter (Signed)
Dedra from [facility] called asking if Dr Loanne Drilling would signed and order for PT go out to patient home, please advise [phone]

## 2014-05-07 ENCOUNTER — Telehealth: Payer: Self-pay

## 2014-05-07 ENCOUNTER — Telehealth: Payer: Self-pay | Admitting: General Practice

## 2014-05-07 NOTE — Telephone Encounter (Signed)
Contacted [facility] waiting on response.

## 2014-05-07 NOTE — Telephone Encounter (Signed)
please set pt up for visit with our coumadin clinic next week.

## 2014-05-07 NOTE — Telephone Encounter (Signed)
Spoke with Patient.  Patient states he cannot get into the office to have INR checked.  I have requested orders from Dr. Loanne Drilling to be sent to [facility] to have [facility] RN check INR.  Patient was just dc'd from SNF and is taking 11 mg of coumadin daily.  Last INR was 1.6 on 4/18.

## 2014-05-07 NOTE — Telephone Encounter (Signed)
Received a message back from the Coumadin clinic nurse Banks. She spoke with [facility] about the orders for the Coumadin check and she is requesting Dr. Loanne Drilling put in the orders for [facility] to check his PT and INR first and then have the results reported to her. Message fwd to Dr. Loanne Drilling. Waiting on response.

## 2014-05-07 NOTE — Telephone Encounter (Deleted)
Pt's daughter advised. Rx placed upfront.

## 2014-05-07 NOTE — Telephone Encounter (Signed)
Travis Chapman with [facility] called to report pt PT INR. Pt had these levels checked on Monday. PT was 19.2 and INR was 1.6. [name] will be faxing orders to our office to verify when the patient needs to have the levels checked again.

## 2014-05-08 ENCOUNTER — Ambulatory Visit: Payer: Self-pay | Admitting: Endocrinology

## 2014-05-08 NOTE — Telephone Encounter (Signed)
Patient is requesting home health come out and check his Coumadin. Pt would prefer not to go to [facility] and have this checked.Pt has appointment at 11:15 today, MD will address at office visit.

## 2014-05-11 ENCOUNTER — Telehealth: Payer: Self-pay | Admitting: Endocrinology

## 2014-05-13 ENCOUNTER — Ambulatory Visit: Payer: Self-pay

## 2014-05-13 ENCOUNTER — Ambulatory Visit: Payer: Self-pay | Admitting: Endocrinology

## 2014-05-13 DIAGNOSIS — E1043 Type 1 diabetes mellitus with diabetic autonomic (poly)neuropathy: Secondary | ICD-10-CM | POA: Diagnosis not present

## 2014-05-13 DIAGNOSIS — E785 Hyperlipidemia, unspecified: Secondary | ICD-10-CM | POA: Diagnosis not present

## 2014-05-13 DIAGNOSIS — J45909 Unspecified asthma, uncomplicated: Secondary | ICD-10-CM | POA: Diagnosis not present

## 2014-05-13 DIAGNOSIS — E10319 Type 1 diabetes mellitus with unspecified diabetic retinopathy without macular edema: Secondary | ICD-10-CM | POA: Diagnosis not present

## 2014-05-13 DIAGNOSIS — I2699 Other pulmonary embolism without acute cor pulmonale: Secondary | ICD-10-CM | POA: Diagnosis not present

## 2014-05-13 DIAGNOSIS — M6281 Muscle weakness (generalized): Secondary | ICD-10-CM | POA: Diagnosis not present

## 2014-05-13 DIAGNOSIS — D075 Carcinoma in situ of prostate: Secondary | ICD-10-CM | POA: Diagnosis not present

## 2014-05-13 DIAGNOSIS — F17210 Nicotine dependence, cigarettes, uncomplicated: Secondary | ICD-10-CM | POA: Diagnosis not present

## 2014-05-13 DIAGNOSIS — F419 Anxiety disorder, unspecified: Secondary | ICD-10-CM | POA: Diagnosis not present

## 2014-05-13 DIAGNOSIS — Z9181 History of falling: Secondary | ICD-10-CM | POA: Diagnosis not present

## 2014-05-13 DIAGNOSIS — I10 Essential (primary) hypertension: Secondary | ICD-10-CM | POA: Diagnosis not present

## 2014-05-13 DIAGNOSIS — I5032 Chronic diastolic (congestive) heart failure: Secondary | ICD-10-CM | POA: Diagnosis not present

## 2014-05-13 DIAGNOSIS — Z7901 Long term (current) use of anticoagulants: Secondary | ICD-10-CM | POA: Diagnosis not present

## 2014-05-13 DIAGNOSIS — M170 Bilateral primary osteoarthritis of knee: Secondary | ICD-10-CM | POA: Diagnosis not present

## 2014-05-13 DIAGNOSIS — J9612 Chronic respiratory failure with hypercapnia: Secondary | ICD-10-CM | POA: Diagnosis not present

## 2014-05-13 DIAGNOSIS — Z5181 Encounter for therapeutic drug level monitoring: Secondary | ICD-10-CM | POA: Diagnosis not present

## 2014-05-13 DIAGNOSIS — Z930 Tracheostomy status: Secondary | ICD-10-CM | POA: Diagnosis not present

## 2014-05-13 DIAGNOSIS — E662 Morbid (severe) obesity with alveolar hypoventilation: Secondary | ICD-10-CM | POA: Diagnosis not present

## 2014-05-14 ENCOUNTER — Encounter: Payer: Self-pay | Admitting: Endocrinology

## 2014-05-14 ENCOUNTER — Telehealth: Payer: Self-pay | Admitting: Endocrinology

## 2014-05-14 NOTE — Telephone Encounter (Signed)
PT Dedra advised to continue physical therapy.

## 2014-05-14 NOTE — Telephone Encounter (Signed)
Unable to reach home health at this time. Will try again later.

## 2014-05-14 NOTE — Telephone Encounter (Signed)
Ok to continue PT.

## 2014-05-14 NOTE — Telephone Encounter (Signed)
Please call home health.  Please do PT/INR, and bring to cynthia boyd at [facility]

## 2014-05-14 NOTE — Telephone Encounter (Signed)
See note below and please advise pt had fell on the 26th. Thanks!

## 2014-05-14 NOTE — Telephone Encounter (Signed)
Left voice mail for Patty, RN with [facility]. I advised Dr. Loanne Drilling has given the ok for the PT/INR check. Requested results to be called to coumadin clinic Nurse (cindy boyd).

## 2014-05-14 NOTE — Telephone Encounter (Signed)
°  They are waiting on verbal order for extension of physical therapy she stated she talk to patient hell the 26th that why he didn't come top his appt Diedra with [facility] # [phone]

## 2014-05-14 NOTE — Telephone Encounter (Signed)
I contacted patients brother and requested patient to call our office to discuss home health orders.

## 2014-05-14 NOTE — Telephone Encounter (Signed)
Diedra with [facility] # [phone]  They are waiting on verbal order for extension of physical therapy

## 2014-05-14 NOTE — Telephone Encounter (Signed)
Patty Phone Number [phone]

## 2014-05-15 ENCOUNTER — Telehealth: Payer: Self-pay

## 2014-05-15 ENCOUNTER — Telehealth: Payer: Self-pay | Admitting: Endocrinology

## 2014-05-15 DIAGNOSIS — E662 Morbid (severe) obesity with alveolar hypoventilation: Secondary | ICD-10-CM | POA: Diagnosis not present

## 2014-05-15 DIAGNOSIS — J45909 Unspecified asthma, uncomplicated: Secondary | ICD-10-CM | POA: Diagnosis not present

## 2014-05-15 DIAGNOSIS — M170 Bilateral primary osteoarthritis of knee: Secondary | ICD-10-CM | POA: Diagnosis not present

## 2014-05-15 DIAGNOSIS — Z930 Tracheostomy status: Secondary | ICD-10-CM | POA: Diagnosis not present

## 2014-05-15 DIAGNOSIS — E1043 Type 1 diabetes mellitus with diabetic autonomic (poly)neuropathy: Secondary | ICD-10-CM | POA: Diagnosis not present

## 2014-05-15 DIAGNOSIS — I5032 Chronic diastolic (congestive) heart failure: Secondary | ICD-10-CM | POA: Diagnosis not present

## 2014-05-15 DIAGNOSIS — I2699 Other pulmonary embolism without acute cor pulmonale: Secondary | ICD-10-CM | POA: Diagnosis not present

## 2014-05-15 DIAGNOSIS — Z9181 History of falling: Secondary | ICD-10-CM | POA: Diagnosis not present

## 2014-05-15 DIAGNOSIS — I10 Essential (primary) hypertension: Secondary | ICD-10-CM | POA: Diagnosis not present

## 2014-05-15 DIAGNOSIS — F17210 Nicotine dependence, cigarettes, uncomplicated: Secondary | ICD-10-CM | POA: Diagnosis not present

## 2014-05-15 DIAGNOSIS — M6281 Muscle weakness (generalized): Secondary | ICD-10-CM | POA: Diagnosis not present

## 2014-05-15 DIAGNOSIS — Z5181 Encounter for therapeutic drug level monitoring: Secondary | ICD-10-CM | POA: Diagnosis not present

## 2014-05-15 DIAGNOSIS — J9612 Chronic respiratory failure with hypercapnia: Secondary | ICD-10-CM | POA: Diagnosis not present

## 2014-05-15 DIAGNOSIS — D075 Carcinoma in situ of prostate: Secondary | ICD-10-CM | POA: Diagnosis not present

## 2014-05-15 DIAGNOSIS — E10319 Type 1 diabetes mellitus with unspecified diabetic retinopathy without macular edema: Secondary | ICD-10-CM | POA: Diagnosis not present

## 2014-05-15 DIAGNOSIS — E785 Hyperlipidemia, unspecified: Secondary | ICD-10-CM | POA: Diagnosis not present

## 2014-05-15 DIAGNOSIS — F419 Anxiety disorder, unspecified: Secondary | ICD-10-CM | POA: Diagnosis not present

## 2014-05-15 DIAGNOSIS — Z7901 Long term (current) use of anticoagulants: Secondary | ICD-10-CM | POA: Diagnosis not present

## 2014-05-15 NOTE — Telephone Encounter (Signed)
Patient dismissed from [facility] by Renato Shin MD , effective May 14, 2014. Dismissal letter sent out by certified / registered mail.  DAJ

## 2014-05-15 NOTE — Telephone Encounter (Signed)
Please continue the same medications for now

## 2014-05-15 NOTE — Telephone Encounter (Signed)
Terril called requesting verbal order for 02 monitoring due to the patient having a trache tube. Please advise, Thanks!

## 2014-05-15 NOTE — Telephone Encounter (Signed)
ok 

## 2014-05-15 NOTE — Telephone Encounter (Signed)
Physical Therapist Dedra notified.

## 2014-05-15 NOTE — Telephone Encounter (Signed)
See note to be advised. Dedra called from [place] stating during home visit today Pt's blood pressure was 170/100. Also wanted MD to be notified of possible medication interactions with tramadol.   Tramadol could possibly interact with the patients Coumadin, Clonidine, and Prilosec.

## 2014-05-18 ENCOUNTER — Ambulatory Visit (INDEPENDENT_AMBULATORY_CARE_PROVIDER_SITE_OTHER): Payer: Medicaid Other | Admitting: General Practice

## 2014-05-18 DIAGNOSIS — J9612 Chronic respiratory failure with hypercapnia: Secondary | ICD-10-CM | POA: Diagnosis not present

## 2014-05-18 DIAGNOSIS — J45909 Unspecified asthma, uncomplicated: Secondary | ICD-10-CM | POA: Diagnosis not present

## 2014-05-18 DIAGNOSIS — M170 Bilateral primary osteoarthritis of knee: Secondary | ICD-10-CM | POA: Diagnosis not present

## 2014-05-18 DIAGNOSIS — F419 Anxiety disorder, unspecified: Secondary | ICD-10-CM | POA: Diagnosis not present

## 2014-05-18 DIAGNOSIS — Z5181 Encounter for therapeutic drug level monitoring: Secondary | ICD-10-CM | POA: Diagnosis not present

## 2014-05-18 DIAGNOSIS — Z930 Tracheostomy status: Secondary | ICD-10-CM | POA: Diagnosis not present

## 2014-05-18 DIAGNOSIS — I5032 Chronic diastolic (congestive) heart failure: Secondary | ICD-10-CM | POA: Diagnosis not present

## 2014-05-18 DIAGNOSIS — Z7901 Long term (current) use of anticoagulants: Secondary | ICD-10-CM | POA: Diagnosis not present

## 2014-05-18 DIAGNOSIS — F17210 Nicotine dependence, cigarettes, uncomplicated: Secondary | ICD-10-CM | POA: Diagnosis not present

## 2014-05-18 DIAGNOSIS — I10 Essential (primary) hypertension: Secondary | ICD-10-CM | POA: Diagnosis not present

## 2014-05-18 DIAGNOSIS — E662 Morbid (severe) obesity with alveolar hypoventilation: Secondary | ICD-10-CM | POA: Diagnosis not present

## 2014-05-18 DIAGNOSIS — I2699 Other pulmonary embolism without acute cor pulmonale: Secondary | ICD-10-CM

## 2014-05-18 DIAGNOSIS — M6281 Muscle weakness (generalized): Secondary | ICD-10-CM | POA: Diagnosis not present

## 2014-05-18 DIAGNOSIS — E785 Hyperlipidemia, unspecified: Secondary | ICD-10-CM | POA: Diagnosis not present

## 2014-05-18 DIAGNOSIS — D075 Carcinoma in situ of prostate: Secondary | ICD-10-CM | POA: Diagnosis not present

## 2014-05-18 DIAGNOSIS — E10319 Type 1 diabetes mellitus with unspecified diabetic retinopathy without macular edema: Secondary | ICD-10-CM | POA: Diagnosis not present

## 2014-05-18 DIAGNOSIS — Z9181 History of falling: Secondary | ICD-10-CM | POA: Diagnosis not present

## 2014-05-18 DIAGNOSIS — E1043 Type 1 diabetes mellitus with diabetic autonomic (poly)neuropathy: Secondary | ICD-10-CM | POA: Diagnosis not present

## 2014-05-18 LAB — POCT INR: INR: 1.6

## 2014-05-18 NOTE — Progress Notes (Signed)
Pre visit review using our clinic review tool, if applicable. No additional management support is needed unless otherwise documented below in the visit note. 

## 2014-05-20 ENCOUNTER — Telehealth: Payer: Self-pay | Admitting: Endocrinology

## 2014-05-20 DIAGNOSIS — F17210 Nicotine dependence, cigarettes, uncomplicated: Secondary | ICD-10-CM | POA: Diagnosis not present

## 2014-05-20 DIAGNOSIS — J9612 Chronic respiratory failure with hypercapnia: Secondary | ICD-10-CM | POA: Diagnosis not present

## 2014-05-20 DIAGNOSIS — Z5181 Encounter for therapeutic drug level monitoring: Secondary | ICD-10-CM | POA: Diagnosis not present

## 2014-05-20 DIAGNOSIS — M6281 Muscle weakness (generalized): Secondary | ICD-10-CM | POA: Diagnosis not present

## 2014-05-20 DIAGNOSIS — I5032 Chronic diastolic (congestive) heart failure: Secondary | ICD-10-CM | POA: Diagnosis not present

## 2014-05-20 DIAGNOSIS — E1043 Type 1 diabetes mellitus with diabetic autonomic (poly)neuropathy: Secondary | ICD-10-CM | POA: Diagnosis not present

## 2014-05-20 DIAGNOSIS — Z9181 History of falling: Secondary | ICD-10-CM | POA: Diagnosis not present

## 2014-05-20 DIAGNOSIS — Z7901 Long term (current) use of anticoagulants: Secondary | ICD-10-CM | POA: Diagnosis not present

## 2014-05-20 DIAGNOSIS — I10 Essential (primary) hypertension: Secondary | ICD-10-CM | POA: Diagnosis not present

## 2014-05-20 DIAGNOSIS — D075 Carcinoma in situ of prostate: Secondary | ICD-10-CM | POA: Diagnosis not present

## 2014-05-20 DIAGNOSIS — E662 Morbid (severe) obesity with alveolar hypoventilation: Secondary | ICD-10-CM | POA: Diagnosis not present

## 2014-05-20 DIAGNOSIS — M170 Bilateral primary osteoarthritis of knee: Secondary | ICD-10-CM | POA: Diagnosis not present

## 2014-05-20 DIAGNOSIS — Z930 Tracheostomy status: Secondary | ICD-10-CM | POA: Diagnosis not present

## 2014-05-20 DIAGNOSIS — E785 Hyperlipidemia, unspecified: Secondary | ICD-10-CM | POA: Diagnosis not present

## 2014-05-20 DIAGNOSIS — E10319 Type 1 diabetes mellitus with unspecified diabetic retinopathy without macular edema: Secondary | ICD-10-CM | POA: Diagnosis not present

## 2014-05-20 DIAGNOSIS — F419 Anxiety disorder, unspecified: Secondary | ICD-10-CM | POA: Diagnosis not present

## 2014-05-20 DIAGNOSIS — I2699 Other pulmonary embolism without acute cor pulmonale: Secondary | ICD-10-CM | POA: Diagnosis not present

## 2014-05-20 DIAGNOSIS — J45909 Unspecified asthma, uncomplicated: Secondary | ICD-10-CM | POA: Diagnosis not present

## 2014-05-20 NOTE — Telephone Encounter (Signed)
Pt needs refills on meds please to carry him for the 30 day period

## 2014-05-21 MED ORDER — SIMVASTATIN 20 MG PO TABS: 20.0000 mg | ORAL_TABLET | Freq: Every day | ORAL | Status: AC

## 2014-05-21 MED ORDER — CARVEDILOL 6.25 MG PO TABS: 6.2500 mg | ORAL_TABLET | Freq: Two times a day (BID) | ORAL | Status: AC

## 2014-05-21 MED ORDER — POLYETHYLENE GLYCOL 3350 17 G PO PACK: 17.0000 g | PACK | Freq: Every day | ORAL | Status: AC

## 2014-05-21 MED ORDER — AMLODIPINE BESYLATE 10 MG PO TABS: 10.0000 mg | ORAL_TABLET | Freq: Every day | ORAL | Status: AC

## 2014-05-21 MED ORDER — FUROSEMIDE 20 MG PO TABS: 60.0000 mg | ORAL_TABLET | Freq: Every day | ORAL | Status: AC

## 2014-05-21 MED ORDER — METOCLOPRAMIDE HCL 5 MG PO TABS: 5.0000 mg | ORAL_TABLET | Freq: Three times a day (TID) | ORAL | Status: AC

## 2014-05-21 MED ORDER — CLONIDINE HCL 0.1 MG PO TABS: 0.1000 mg | ORAL_TABLET | Freq: Two times a day (BID) | ORAL | Status: AC

## 2014-05-21 MED ORDER — CITALOPRAM HYDROBROMIDE 20 MG PO TABS: 20.0000 mg | ORAL_TABLET | Freq: Every evening | ORAL | Status: AC

## 2014-05-21 MED ORDER — LOSARTAN POTASSIUM 100 MG PO TABS: 50.0000 mg | ORAL_TABLET | Freq: Every day | ORAL | Status: AC

## 2014-05-21 MED ORDER — WARFARIN SODIUM 10 MG PO TABS: ORAL_TABLET | ORAL | Status: AC

## 2014-05-21 MED ORDER — OXYCODONE HCL 10 MG PO TABS: 10.0000 mg | ORAL_TABLET | ORAL | Status: AC | PRN

## 2014-05-21 MED ORDER — INSULIN ASPART 100 UNIT/ML ~~LOC~~ SOLN: SUBCUTANEOUS | Status: AC

## 2014-05-21 MED ORDER — INSULIN GLARGINE 100 UNIT/ML ~~LOC~~ SOLN: 28.0000 [IU] | Freq: Every morning | SUBCUTANEOUS | Status: AC

## 2014-05-21 MED ORDER — POTASSIUM CHLORIDE CRYS ER 20 MEQ PO TBCR: 40.0000 meq | EXTENDED_RELEASE_TABLET | Freq: Every day | ORAL | Status: AC

## 2014-05-21 NOTE — Telephone Encounter (Signed)
i printed the oxycodone. Please refill the others x 1, via epic

## 2014-05-21 NOTE — Telephone Encounter (Signed)
See note below and please advise. Thanks! 

## 2014-05-21 NOTE — Telephone Encounter (Signed)
Pt is requesting a refill of his Celexa 20 mg, Clonidine 0.1 mg and Oxycodone 10 mg.

## 2014-05-21 NOTE — Telephone Encounter (Signed)
Patient advised that rx is ready for pick up. Rx placed up front.

## 2014-05-21 NOTE — Telephone Encounter (Signed)
Please refill meds x 1

## 2014-05-22 DIAGNOSIS — D075 Carcinoma in situ of prostate: Secondary | ICD-10-CM | POA: Diagnosis not present

## 2014-05-22 DIAGNOSIS — Z5181 Encounter for therapeutic drug level monitoring: Secondary | ICD-10-CM | POA: Diagnosis not present

## 2014-05-22 DIAGNOSIS — I10 Essential (primary) hypertension: Secondary | ICD-10-CM | POA: Diagnosis not present

## 2014-05-22 DIAGNOSIS — I2699 Other pulmonary embolism without acute cor pulmonale: Secondary | ICD-10-CM | POA: Diagnosis not present

## 2014-05-22 DIAGNOSIS — F419 Anxiety disorder, unspecified: Secondary | ICD-10-CM | POA: Diagnosis not present

## 2014-05-22 DIAGNOSIS — I5032 Chronic diastolic (congestive) heart failure: Secondary | ICD-10-CM | POA: Diagnosis not present

## 2014-05-22 DIAGNOSIS — M170 Bilateral primary osteoarthritis of knee: Secondary | ICD-10-CM | POA: Diagnosis not present

## 2014-05-22 DIAGNOSIS — Z7901 Long term (current) use of anticoagulants: Secondary | ICD-10-CM | POA: Diagnosis not present

## 2014-05-22 DIAGNOSIS — E662 Morbid (severe) obesity with alveolar hypoventilation: Secondary | ICD-10-CM | POA: Diagnosis not present

## 2014-05-22 DIAGNOSIS — E785 Hyperlipidemia, unspecified: Secondary | ICD-10-CM | POA: Diagnosis not present

## 2014-05-22 DIAGNOSIS — J45909 Unspecified asthma, uncomplicated: Secondary | ICD-10-CM | POA: Diagnosis not present

## 2014-05-22 DIAGNOSIS — F17210 Nicotine dependence, cigarettes, uncomplicated: Secondary | ICD-10-CM | POA: Diagnosis not present

## 2014-05-22 DIAGNOSIS — E1043 Type 1 diabetes mellitus with diabetic autonomic (poly)neuropathy: Secondary | ICD-10-CM | POA: Diagnosis not present

## 2014-05-22 DIAGNOSIS — J9612 Chronic respiratory failure with hypercapnia: Secondary | ICD-10-CM | POA: Diagnosis not present

## 2014-05-22 DIAGNOSIS — Z9181 History of falling: Secondary | ICD-10-CM | POA: Diagnosis not present

## 2014-05-22 DIAGNOSIS — M6281 Muscle weakness (generalized): Secondary | ICD-10-CM | POA: Diagnosis not present

## 2014-05-22 DIAGNOSIS — E10319 Type 1 diabetes mellitus with unspecified diabetic retinopathy without macular edema: Secondary | ICD-10-CM | POA: Diagnosis not present

## 2014-05-22 DIAGNOSIS — Z930 Tracheostomy status: Secondary | ICD-10-CM | POA: Diagnosis not present

## 2014-05-23 DIAGNOSIS — J45909 Unspecified asthma, uncomplicated: Secondary | ICD-10-CM | POA: Diagnosis not present

## 2014-05-23 DIAGNOSIS — E10319 Type 1 diabetes mellitus with unspecified diabetic retinopathy without macular edema: Secondary | ICD-10-CM | POA: Diagnosis not present

## 2014-05-23 DIAGNOSIS — J9612 Chronic respiratory failure with hypercapnia: Secondary | ICD-10-CM | POA: Diagnosis not present

## 2014-05-23 DIAGNOSIS — Z5181 Encounter for therapeutic drug level monitoring: Secondary | ICD-10-CM | POA: Diagnosis not present

## 2014-05-23 DIAGNOSIS — E785 Hyperlipidemia, unspecified: Secondary | ICD-10-CM | POA: Diagnosis not present

## 2014-05-23 DIAGNOSIS — I5032 Chronic diastolic (congestive) heart failure: Secondary | ICD-10-CM | POA: Diagnosis not present

## 2014-05-23 DIAGNOSIS — Z9181 History of falling: Secondary | ICD-10-CM | POA: Diagnosis not present

## 2014-05-23 DIAGNOSIS — E1043 Type 1 diabetes mellitus with diabetic autonomic (poly)neuropathy: Secondary | ICD-10-CM | POA: Diagnosis not present

## 2014-05-23 DIAGNOSIS — Z7901 Long term (current) use of anticoagulants: Secondary | ICD-10-CM | POA: Diagnosis not present

## 2014-05-23 DIAGNOSIS — I10 Essential (primary) hypertension: Secondary | ICD-10-CM | POA: Diagnosis not present

## 2014-05-23 DIAGNOSIS — M6281 Muscle weakness (generalized): Secondary | ICD-10-CM | POA: Diagnosis not present

## 2014-05-23 DIAGNOSIS — E662 Morbid (severe) obesity with alveolar hypoventilation: Secondary | ICD-10-CM | POA: Diagnosis not present

## 2014-05-23 DIAGNOSIS — I2699 Other pulmonary embolism without acute cor pulmonale: Secondary | ICD-10-CM | POA: Diagnosis not present

## 2014-05-23 DIAGNOSIS — M170 Bilateral primary osteoarthritis of knee: Secondary | ICD-10-CM | POA: Diagnosis not present

## 2014-05-23 DIAGNOSIS — D075 Carcinoma in situ of prostate: Secondary | ICD-10-CM | POA: Diagnosis not present

## 2014-05-23 DIAGNOSIS — F17210 Nicotine dependence, cigarettes, uncomplicated: Secondary | ICD-10-CM | POA: Diagnosis not present

## 2014-05-23 DIAGNOSIS — Z930 Tracheostomy status: Secondary | ICD-10-CM | POA: Diagnosis not present

## 2014-05-23 DIAGNOSIS — F419 Anxiety disorder, unspecified: Secondary | ICD-10-CM | POA: Diagnosis not present

## 2014-05-25 ENCOUNTER — Encounter: Payer: Self-pay | Admitting: Internal Medicine

## 2014-05-25 ENCOUNTER — Ambulatory Visit (INDEPENDENT_AMBULATORY_CARE_PROVIDER_SITE_OTHER): Payer: Medicaid Other | Admitting: General Practice

## 2014-05-25 DIAGNOSIS — J45909 Unspecified asthma, uncomplicated: Secondary | ICD-10-CM | POA: Diagnosis not present

## 2014-05-25 DIAGNOSIS — M6281 Muscle weakness (generalized): Secondary | ICD-10-CM | POA: Diagnosis not present

## 2014-05-25 DIAGNOSIS — E785 Hyperlipidemia, unspecified: Secondary | ICD-10-CM | POA: Diagnosis not present

## 2014-05-25 DIAGNOSIS — I10 Essential (primary) hypertension: Secondary | ICD-10-CM | POA: Diagnosis not present

## 2014-05-25 DIAGNOSIS — I2699 Other pulmonary embolism without acute cor pulmonale: Secondary | ICD-10-CM

## 2014-05-25 DIAGNOSIS — E10319 Type 1 diabetes mellitus with unspecified diabetic retinopathy without macular edema: Secondary | ICD-10-CM | POA: Diagnosis not present

## 2014-05-25 DIAGNOSIS — E1043 Type 1 diabetes mellitus with diabetic autonomic (poly)neuropathy: Secondary | ICD-10-CM | POA: Diagnosis not present

## 2014-05-25 DIAGNOSIS — Z930 Tracheostomy status: Secondary | ICD-10-CM | POA: Diagnosis not present

## 2014-05-25 DIAGNOSIS — Z5181 Encounter for therapeutic drug level monitoring: Secondary | ICD-10-CM

## 2014-05-25 DIAGNOSIS — E662 Morbid (severe) obesity with alveolar hypoventilation: Secondary | ICD-10-CM | POA: Diagnosis not present

## 2014-05-25 DIAGNOSIS — M170 Bilateral primary osteoarthritis of knee: Secondary | ICD-10-CM | POA: Diagnosis not present

## 2014-05-25 DIAGNOSIS — Z7901 Long term (current) use of anticoagulants: Secondary | ICD-10-CM | POA: Diagnosis not present

## 2014-05-25 DIAGNOSIS — F17210 Nicotine dependence, cigarettes, uncomplicated: Secondary | ICD-10-CM | POA: Diagnosis not present

## 2014-05-25 DIAGNOSIS — J9612 Chronic respiratory failure with hypercapnia: Secondary | ICD-10-CM | POA: Diagnosis not present

## 2014-05-25 DIAGNOSIS — F419 Anxiety disorder, unspecified: Secondary | ICD-10-CM | POA: Diagnosis not present

## 2014-05-25 DIAGNOSIS — D075 Carcinoma in situ of prostate: Secondary | ICD-10-CM | POA: Diagnosis not present

## 2014-05-25 DIAGNOSIS — I5032 Chronic diastolic (congestive) heart failure: Secondary | ICD-10-CM | POA: Diagnosis not present

## 2014-05-25 DIAGNOSIS — Z9181 History of falling: Secondary | ICD-10-CM | POA: Diagnosis not present

## 2014-05-25 LAB — POCT INR: INR: 1.6

## 2014-05-25 NOTE — Progress Notes (Signed)
Pre visit review using our clinic review tool, if applicable. No additional management support is needed unless otherwise documented below in the visit note. 

## 2014-05-26 ENCOUNTER — Ambulatory Visit: Payer: Self-pay | Admitting: Emergency Medicine

## 2014-05-26 ENCOUNTER — Encounter: Payer: Self-pay | Admitting: Internal Medicine

## 2014-05-27 DIAGNOSIS — D075 Carcinoma in situ of prostate: Secondary | ICD-10-CM | POA: Diagnosis not present

## 2014-05-27 DIAGNOSIS — F419 Anxiety disorder, unspecified: Secondary | ICD-10-CM | POA: Diagnosis not present

## 2014-05-27 DIAGNOSIS — F17210 Nicotine dependence, cigarettes, uncomplicated: Secondary | ICD-10-CM | POA: Diagnosis not present

## 2014-05-27 DIAGNOSIS — J9612 Chronic respiratory failure with hypercapnia: Secondary | ICD-10-CM | POA: Diagnosis not present

## 2014-05-27 DIAGNOSIS — Z5181 Encounter for therapeutic drug level monitoring: Secondary | ICD-10-CM | POA: Diagnosis not present

## 2014-05-27 DIAGNOSIS — M6281 Muscle weakness (generalized): Secondary | ICD-10-CM | POA: Diagnosis not present

## 2014-05-27 DIAGNOSIS — J45909 Unspecified asthma, uncomplicated: Secondary | ICD-10-CM | POA: Diagnosis not present

## 2014-05-27 DIAGNOSIS — E662 Morbid (severe) obesity with alveolar hypoventilation: Secondary | ICD-10-CM | POA: Diagnosis not present

## 2014-05-27 DIAGNOSIS — E10319 Type 1 diabetes mellitus with unspecified diabetic retinopathy without macular edema: Secondary | ICD-10-CM | POA: Diagnosis not present

## 2014-05-27 DIAGNOSIS — E785 Hyperlipidemia, unspecified: Secondary | ICD-10-CM | POA: Diagnosis not present

## 2014-05-27 DIAGNOSIS — I10 Essential (primary) hypertension: Secondary | ICD-10-CM | POA: Diagnosis not present

## 2014-05-27 DIAGNOSIS — E1043 Type 1 diabetes mellitus with diabetic autonomic (poly)neuropathy: Secondary | ICD-10-CM | POA: Diagnosis not present

## 2014-05-27 DIAGNOSIS — I2699 Other pulmonary embolism without acute cor pulmonale: Secondary | ICD-10-CM | POA: Diagnosis not present

## 2014-05-27 DIAGNOSIS — Z930 Tracheostomy status: Secondary | ICD-10-CM | POA: Diagnosis not present

## 2014-05-27 DIAGNOSIS — M170 Bilateral primary osteoarthritis of knee: Secondary | ICD-10-CM | POA: Diagnosis not present

## 2014-05-27 DIAGNOSIS — Z7901 Long term (current) use of anticoagulants: Secondary | ICD-10-CM | POA: Diagnosis not present

## 2014-05-27 DIAGNOSIS — I5032 Chronic diastolic (congestive) heart failure: Secondary | ICD-10-CM | POA: Diagnosis not present

## 2014-05-27 DIAGNOSIS — Z9181 History of falling: Secondary | ICD-10-CM | POA: Diagnosis not present

## 2014-05-29 DIAGNOSIS — I10 Essential (primary) hypertension: Secondary | ICD-10-CM | POA: Diagnosis not present

## 2014-05-29 DIAGNOSIS — F419 Anxiety disorder, unspecified: Secondary | ICD-10-CM | POA: Diagnosis not present

## 2014-05-29 DIAGNOSIS — E785 Hyperlipidemia, unspecified: Secondary | ICD-10-CM | POA: Diagnosis not present

## 2014-05-29 DIAGNOSIS — Z9181 History of falling: Secondary | ICD-10-CM | POA: Diagnosis not present

## 2014-05-29 DIAGNOSIS — I5032 Chronic diastolic (congestive) heart failure: Secondary | ICD-10-CM | POA: Diagnosis not present

## 2014-05-29 DIAGNOSIS — Z930 Tracheostomy status: Secondary | ICD-10-CM | POA: Diagnosis not present

## 2014-05-29 DIAGNOSIS — Z5181 Encounter for therapeutic drug level monitoring: Secondary | ICD-10-CM | POA: Diagnosis not present

## 2014-05-29 DIAGNOSIS — F17210 Nicotine dependence, cigarettes, uncomplicated: Secondary | ICD-10-CM | POA: Diagnosis not present

## 2014-05-29 DIAGNOSIS — M6281 Muscle weakness (generalized): Secondary | ICD-10-CM | POA: Diagnosis not present

## 2014-05-29 DIAGNOSIS — J9612 Chronic respiratory failure with hypercapnia: Secondary | ICD-10-CM | POA: Diagnosis not present

## 2014-05-29 DIAGNOSIS — E10319 Type 1 diabetes mellitus with unspecified diabetic retinopathy without macular edema: Secondary | ICD-10-CM | POA: Diagnosis not present

## 2014-05-29 DIAGNOSIS — J449 Chronic obstructive pulmonary disease, unspecified: Secondary | ICD-10-CM | POA: Diagnosis not present

## 2014-05-29 DIAGNOSIS — E662 Morbid (severe) obesity with alveolar hypoventilation: Secondary | ICD-10-CM | POA: Diagnosis not present

## 2014-05-29 DIAGNOSIS — J45909 Unspecified asthma, uncomplicated: Secondary | ICD-10-CM | POA: Diagnosis not present

## 2014-05-29 DIAGNOSIS — M170 Bilateral primary osteoarthritis of knee: Secondary | ICD-10-CM | POA: Diagnosis not present

## 2014-05-29 DIAGNOSIS — Z7901 Long term (current) use of anticoagulants: Secondary | ICD-10-CM | POA: Diagnosis not present

## 2014-05-29 DIAGNOSIS — E669 Obesity, unspecified: Secondary | ICD-10-CM | POA: Diagnosis not present

## 2014-05-29 DIAGNOSIS — D075 Carcinoma in situ of prostate: Secondary | ICD-10-CM | POA: Diagnosis not present

## 2014-05-29 DIAGNOSIS — I2699 Other pulmonary embolism without acute cor pulmonale: Secondary | ICD-10-CM | POA: Diagnosis not present

## 2014-05-29 DIAGNOSIS — I509 Heart failure, unspecified: Secondary | ICD-10-CM | POA: Diagnosis not present

## 2014-05-29 DIAGNOSIS — E1043 Type 1 diabetes mellitus with diabetic autonomic (poly)neuropathy: Secondary | ICD-10-CM | POA: Diagnosis not present

## 2014-05-30 DIAGNOSIS — I509 Heart failure, unspecified: Secondary | ICD-10-CM | POA: Diagnosis not present

## 2014-05-30 DIAGNOSIS — E669 Obesity, unspecified: Secondary | ICD-10-CM | POA: Diagnosis not present

## 2014-06-01 ENCOUNTER — Telehealth: Payer: Self-pay | Admitting: Endocrinology

## 2014-06-01 DIAGNOSIS — E785 Hyperlipidemia, unspecified: Secondary | ICD-10-CM | POA: Diagnosis not present

## 2014-06-01 DIAGNOSIS — I5032 Chronic diastolic (congestive) heart failure: Secondary | ICD-10-CM | POA: Diagnosis not present

## 2014-06-01 DIAGNOSIS — Z9181 History of falling: Secondary | ICD-10-CM | POA: Diagnosis not present

## 2014-06-01 DIAGNOSIS — J45909 Unspecified asthma, uncomplicated: Secondary | ICD-10-CM | POA: Diagnosis not present

## 2014-06-01 DIAGNOSIS — Z5181 Encounter for therapeutic drug level monitoring: Secondary | ICD-10-CM | POA: Diagnosis not present

## 2014-06-01 DIAGNOSIS — Z7901 Long term (current) use of anticoagulants: Secondary | ICD-10-CM | POA: Diagnosis not present

## 2014-06-01 DIAGNOSIS — F17210 Nicotine dependence, cigarettes, uncomplicated: Secondary | ICD-10-CM | POA: Diagnosis not present

## 2014-06-01 DIAGNOSIS — Z930 Tracheostomy status: Secondary | ICD-10-CM | POA: Diagnosis not present

## 2014-06-01 DIAGNOSIS — E1043 Type 1 diabetes mellitus with diabetic autonomic (poly)neuropathy: Secondary | ICD-10-CM | POA: Diagnosis not present

## 2014-06-01 DIAGNOSIS — F419 Anxiety disorder, unspecified: Secondary | ICD-10-CM | POA: Diagnosis not present

## 2014-06-01 DIAGNOSIS — E662 Morbid (severe) obesity with alveolar hypoventilation: Secondary | ICD-10-CM | POA: Diagnosis not present

## 2014-06-01 DIAGNOSIS — D075 Carcinoma in situ of prostate: Secondary | ICD-10-CM | POA: Diagnosis not present

## 2014-06-01 DIAGNOSIS — E10319 Type 1 diabetes mellitus with unspecified diabetic retinopathy without macular edema: Secondary | ICD-10-CM | POA: Diagnosis not present

## 2014-06-01 DIAGNOSIS — M170 Bilateral primary osteoarthritis of knee: Secondary | ICD-10-CM | POA: Diagnosis not present

## 2014-06-01 DIAGNOSIS — J9612 Chronic respiratory failure with hypercapnia: Secondary | ICD-10-CM | POA: Diagnosis not present

## 2014-06-01 DIAGNOSIS — M6281 Muscle weakness (generalized): Secondary | ICD-10-CM | POA: Diagnosis not present

## 2014-06-01 DIAGNOSIS — I10 Essential (primary) hypertension: Secondary | ICD-10-CM | POA: Diagnosis not present

## 2014-06-01 DIAGNOSIS — I2699 Other pulmonary embolism without acute cor pulmonale: Secondary | ICD-10-CM | POA: Diagnosis not present

## 2014-06-01 NOTE — Telephone Encounter (Signed)
Travis Chapman # [phone] Pt daughter wants to make an appt for the pt before his 30 days are up from discharge can we do this?

## 2014-06-02 ENCOUNTER — Ambulatory Visit (INDEPENDENT_AMBULATORY_CARE_PROVIDER_SITE_OTHER): Payer: Medicaid Other | Admitting: General Practice

## 2014-06-02 DIAGNOSIS — J9612 Chronic respiratory failure with hypercapnia: Secondary | ICD-10-CM | POA: Diagnosis not present

## 2014-06-02 DIAGNOSIS — I5032 Chronic diastolic (congestive) heart failure: Secondary | ICD-10-CM | POA: Diagnosis not present

## 2014-06-02 DIAGNOSIS — I10 Essential (primary) hypertension: Secondary | ICD-10-CM | POA: Diagnosis not present

## 2014-06-02 DIAGNOSIS — F419 Anxiety disorder, unspecified: Secondary | ICD-10-CM | POA: Diagnosis not present

## 2014-06-02 DIAGNOSIS — M6281 Muscle weakness (generalized): Secondary | ICD-10-CM | POA: Diagnosis not present

## 2014-06-02 DIAGNOSIS — Z9181 History of falling: Secondary | ICD-10-CM | POA: Diagnosis not present

## 2014-06-02 DIAGNOSIS — Z7901 Long term (current) use of anticoagulants: Secondary | ICD-10-CM | POA: Diagnosis not present

## 2014-06-02 DIAGNOSIS — E10319 Type 1 diabetes mellitus with unspecified diabetic retinopathy without macular edema: Secondary | ICD-10-CM | POA: Diagnosis not present

## 2014-06-02 DIAGNOSIS — F17210 Nicotine dependence, cigarettes, uncomplicated: Secondary | ICD-10-CM | POA: Diagnosis not present

## 2014-06-02 DIAGNOSIS — E1043 Type 1 diabetes mellitus with diabetic autonomic (poly)neuropathy: Secondary | ICD-10-CM | POA: Diagnosis not present

## 2014-06-02 DIAGNOSIS — E662 Morbid (severe) obesity with alveolar hypoventilation: Secondary | ICD-10-CM | POA: Diagnosis not present

## 2014-06-02 DIAGNOSIS — E785 Hyperlipidemia, unspecified: Secondary | ICD-10-CM | POA: Diagnosis not present

## 2014-06-02 DIAGNOSIS — I2699 Other pulmonary embolism without acute cor pulmonale: Secondary | ICD-10-CM

## 2014-06-02 DIAGNOSIS — M170 Bilateral primary osteoarthritis of knee: Secondary | ICD-10-CM | POA: Diagnosis not present

## 2014-06-02 DIAGNOSIS — Z5181 Encounter for therapeutic drug level monitoring: Secondary | ICD-10-CM | POA: Diagnosis not present

## 2014-06-02 DIAGNOSIS — J45909 Unspecified asthma, uncomplicated: Secondary | ICD-10-CM | POA: Diagnosis not present

## 2014-06-02 DIAGNOSIS — Z930 Tracheostomy status: Secondary | ICD-10-CM | POA: Diagnosis not present

## 2014-06-02 DIAGNOSIS — D075 Carcinoma in situ of prostate: Secondary | ICD-10-CM | POA: Diagnosis not present

## 2014-06-02 LAB — POCT INR: INR: 1.7

## 2014-06-02 NOTE — Progress Notes (Signed)
Pre visit review using our clinic review tool, if applicable. No additional management support is needed unless otherwise documented below in the visit note. 

## 2014-06-02 NOTE — Progress Notes (Signed)
Agree with plan 

## 2014-06-03 DIAGNOSIS — Z930 Tracheostomy status: Secondary | ICD-10-CM | POA: Diagnosis not present

## 2014-06-03 DIAGNOSIS — Z9181 History of falling: Secondary | ICD-10-CM | POA: Diagnosis not present

## 2014-06-03 DIAGNOSIS — I509 Heart failure, unspecified: Secondary | ICD-10-CM | POA: Diagnosis not present

## 2014-06-03 DIAGNOSIS — J449 Chronic obstructive pulmonary disease, unspecified: Secondary | ICD-10-CM | POA: Diagnosis not present

## 2014-06-03 DIAGNOSIS — F17210 Nicotine dependence, cigarettes, uncomplicated: Secondary | ICD-10-CM | POA: Diagnosis not present

## 2014-06-03 DIAGNOSIS — J9612 Chronic respiratory failure with hypercapnia: Secondary | ICD-10-CM | POA: Diagnosis not present

## 2014-06-03 DIAGNOSIS — E785 Hyperlipidemia, unspecified: Secondary | ICD-10-CM | POA: Diagnosis not present

## 2014-06-03 DIAGNOSIS — M170 Bilateral primary osteoarthritis of knee: Secondary | ICD-10-CM | POA: Diagnosis not present

## 2014-06-03 DIAGNOSIS — I5032 Chronic diastolic (congestive) heart failure: Secondary | ICD-10-CM | POA: Diagnosis not present

## 2014-06-03 DIAGNOSIS — J9610 Chronic respiratory failure, unspecified whether with hypoxia or hypercapnia: Secondary | ICD-10-CM | POA: Diagnosis not present

## 2014-06-03 DIAGNOSIS — J45909 Unspecified asthma, uncomplicated: Secondary | ICD-10-CM | POA: Diagnosis not present

## 2014-06-03 DIAGNOSIS — Z7901 Long term (current) use of anticoagulants: Secondary | ICD-10-CM | POA: Diagnosis not present

## 2014-06-03 DIAGNOSIS — E10319 Type 1 diabetes mellitus with unspecified diabetic retinopathy without macular edema: Secondary | ICD-10-CM | POA: Diagnosis not present

## 2014-06-03 DIAGNOSIS — F419 Anxiety disorder, unspecified: Secondary | ICD-10-CM | POA: Diagnosis not present

## 2014-06-03 DIAGNOSIS — E1043 Type 1 diabetes mellitus with diabetic autonomic (poly)neuropathy: Secondary | ICD-10-CM | POA: Diagnosis not present

## 2014-06-03 DIAGNOSIS — Z5181 Encounter for therapeutic drug level monitoring: Secondary | ICD-10-CM | POA: Diagnosis not present

## 2014-06-03 DIAGNOSIS — I2699 Other pulmonary embolism without acute cor pulmonale: Secondary | ICD-10-CM | POA: Diagnosis not present

## 2014-06-03 DIAGNOSIS — M6281 Muscle weakness (generalized): Secondary | ICD-10-CM | POA: Diagnosis not present

## 2014-06-03 DIAGNOSIS — E662 Morbid (severe) obesity with alveolar hypoventilation: Secondary | ICD-10-CM | POA: Diagnosis not present

## 2014-06-03 DIAGNOSIS — I10 Essential (primary) hypertension: Secondary | ICD-10-CM | POA: Diagnosis not present

## 2014-06-03 DIAGNOSIS — D075 Carcinoma in situ of prostate: Secondary | ICD-10-CM | POA: Diagnosis not present

## 2014-06-03 NOTE — Telephone Encounter (Signed)
Patient daughter called ask if patient could get enough medication to keep him until his next appt with his new doctor. [phone]

## 2014-06-04 NOTE — Telephone Encounter (Signed)
I contacted the patients daughter advised his medications have been sent for a 30 day supply to [place] on [address]. Patients daughter voiced understanding.

## 2014-06-05 DIAGNOSIS — Z930 Tracheostomy status: Secondary | ICD-10-CM | POA: Diagnosis not present

## 2014-06-05 DIAGNOSIS — I2699 Other pulmonary embolism without acute cor pulmonale: Secondary | ICD-10-CM | POA: Diagnosis not present

## 2014-06-05 DIAGNOSIS — Z5181 Encounter for therapeutic drug level monitoring: Secondary | ICD-10-CM | POA: Diagnosis not present

## 2014-06-05 DIAGNOSIS — I5032 Chronic diastolic (congestive) heart failure: Secondary | ICD-10-CM | POA: Diagnosis not present

## 2014-06-05 DIAGNOSIS — Z7901 Long term (current) use of anticoagulants: Secondary | ICD-10-CM | POA: Diagnosis not present

## 2014-06-05 DIAGNOSIS — E10319 Type 1 diabetes mellitus with unspecified diabetic retinopathy without macular edema: Secondary | ICD-10-CM | POA: Diagnosis not present

## 2014-06-05 DIAGNOSIS — Z9181 History of falling: Secondary | ICD-10-CM | POA: Diagnosis not present

## 2014-06-05 DIAGNOSIS — E785 Hyperlipidemia, unspecified: Secondary | ICD-10-CM | POA: Diagnosis not present

## 2014-06-05 DIAGNOSIS — E662 Morbid (severe) obesity with alveolar hypoventilation: Secondary | ICD-10-CM | POA: Diagnosis not present

## 2014-06-05 DIAGNOSIS — E1043 Type 1 diabetes mellitus with diabetic autonomic (poly)neuropathy: Secondary | ICD-10-CM | POA: Diagnosis not present

## 2014-06-05 DIAGNOSIS — F17210 Nicotine dependence, cigarettes, uncomplicated: Secondary | ICD-10-CM | POA: Diagnosis not present

## 2014-06-05 DIAGNOSIS — I10 Essential (primary) hypertension: Secondary | ICD-10-CM | POA: Diagnosis not present

## 2014-06-05 DIAGNOSIS — M170 Bilateral primary osteoarthritis of knee: Secondary | ICD-10-CM | POA: Diagnosis not present

## 2014-06-05 DIAGNOSIS — J9612 Chronic respiratory failure with hypercapnia: Secondary | ICD-10-CM | POA: Diagnosis not present

## 2014-06-05 DIAGNOSIS — J45909 Unspecified asthma, uncomplicated: Secondary | ICD-10-CM | POA: Diagnosis not present

## 2014-06-05 DIAGNOSIS — D075 Carcinoma in situ of prostate: Secondary | ICD-10-CM | POA: Diagnosis not present

## 2014-06-05 DIAGNOSIS — F419 Anxiety disorder, unspecified: Secondary | ICD-10-CM | POA: Diagnosis not present

## 2014-06-05 DIAGNOSIS — M6281 Muscle weakness (generalized): Secondary | ICD-10-CM | POA: Diagnosis not present

## 2014-06-08 ENCOUNTER — Ambulatory Visit (INDEPENDENT_AMBULATORY_CARE_PROVIDER_SITE_OTHER): Payer: Medicaid Other | Admitting: General Practice

## 2014-06-08 DIAGNOSIS — I5032 Chronic diastolic (congestive) heart failure: Secondary | ICD-10-CM | POA: Diagnosis not present

## 2014-06-08 DIAGNOSIS — E785 Hyperlipidemia, unspecified: Secondary | ICD-10-CM | POA: Diagnosis not present

## 2014-06-08 DIAGNOSIS — F17210 Nicotine dependence, cigarettes, uncomplicated: Secondary | ICD-10-CM | POA: Diagnosis not present

## 2014-06-08 DIAGNOSIS — J9612 Chronic respiratory failure with hypercapnia: Secondary | ICD-10-CM | POA: Diagnosis not present

## 2014-06-08 DIAGNOSIS — E1043 Type 1 diabetes mellitus with diabetic autonomic (poly)neuropathy: Secondary | ICD-10-CM | POA: Diagnosis not present

## 2014-06-08 DIAGNOSIS — M170 Bilateral primary osteoarthritis of knee: Secondary | ICD-10-CM | POA: Diagnosis not present

## 2014-06-08 DIAGNOSIS — Z9181 History of falling: Secondary | ICD-10-CM | POA: Diagnosis not present

## 2014-06-08 DIAGNOSIS — E10319 Type 1 diabetes mellitus with unspecified diabetic retinopathy without macular edema: Secondary | ICD-10-CM | POA: Diagnosis not present

## 2014-06-08 DIAGNOSIS — E662 Morbid (severe) obesity with alveolar hypoventilation: Secondary | ICD-10-CM | POA: Diagnosis not present

## 2014-06-08 DIAGNOSIS — Z930 Tracheostomy status: Secondary | ICD-10-CM | POA: Diagnosis not present

## 2014-06-08 DIAGNOSIS — J45909 Unspecified asthma, uncomplicated: Secondary | ICD-10-CM | POA: Diagnosis not present

## 2014-06-08 DIAGNOSIS — Z5181 Encounter for therapeutic drug level monitoring: Secondary | ICD-10-CM | POA: Diagnosis not present

## 2014-06-08 DIAGNOSIS — I10 Essential (primary) hypertension: Secondary | ICD-10-CM | POA: Diagnosis not present

## 2014-06-08 DIAGNOSIS — I2699 Other pulmonary embolism without acute cor pulmonale: Secondary | ICD-10-CM | POA: Diagnosis not present

## 2014-06-08 DIAGNOSIS — D075 Carcinoma in situ of prostate: Secondary | ICD-10-CM | POA: Diagnosis not present

## 2014-06-08 DIAGNOSIS — Z7901 Long term (current) use of anticoagulants: Secondary | ICD-10-CM | POA: Diagnosis not present

## 2014-06-08 DIAGNOSIS — F419 Anxiety disorder, unspecified: Secondary | ICD-10-CM | POA: Diagnosis not present

## 2014-06-08 DIAGNOSIS — M6281 Muscle weakness (generalized): Secondary | ICD-10-CM | POA: Diagnosis not present

## 2014-06-08 LAB — POCT INR: INR: 2.3

## 2014-06-10 DIAGNOSIS — Z5181 Encounter for therapeutic drug level monitoring: Secondary | ICD-10-CM | POA: Diagnosis not present

## 2014-06-10 DIAGNOSIS — M170 Bilateral primary osteoarthritis of knee: Secondary | ICD-10-CM | POA: Diagnosis not present

## 2014-06-10 DIAGNOSIS — F419 Anxiety disorder, unspecified: Secondary | ICD-10-CM | POA: Diagnosis not present

## 2014-06-10 DIAGNOSIS — Z9181 History of falling: Secondary | ICD-10-CM | POA: Diagnosis not present

## 2014-06-10 DIAGNOSIS — E10319 Type 1 diabetes mellitus with unspecified diabetic retinopathy without macular edema: Secondary | ICD-10-CM | POA: Diagnosis not present

## 2014-06-10 DIAGNOSIS — E1043 Type 1 diabetes mellitus with diabetic autonomic (poly)neuropathy: Secondary | ICD-10-CM | POA: Diagnosis not present

## 2014-06-10 DIAGNOSIS — J45909 Unspecified asthma, uncomplicated: Secondary | ICD-10-CM | POA: Diagnosis not present

## 2014-06-10 DIAGNOSIS — M6281 Muscle weakness (generalized): Secondary | ICD-10-CM | POA: Diagnosis not present

## 2014-06-10 DIAGNOSIS — Z7901 Long term (current) use of anticoagulants: Secondary | ICD-10-CM | POA: Diagnosis not present

## 2014-06-10 DIAGNOSIS — I10 Essential (primary) hypertension: Secondary | ICD-10-CM | POA: Diagnosis not present

## 2014-06-10 DIAGNOSIS — E785 Hyperlipidemia, unspecified: Secondary | ICD-10-CM | POA: Diagnosis not present

## 2014-06-10 DIAGNOSIS — D075 Carcinoma in situ of prostate: Secondary | ICD-10-CM | POA: Diagnosis not present

## 2014-06-10 DIAGNOSIS — I5032 Chronic diastolic (congestive) heart failure: Secondary | ICD-10-CM | POA: Diagnosis not present

## 2014-06-10 DIAGNOSIS — I2699 Other pulmonary embolism without acute cor pulmonale: Secondary | ICD-10-CM | POA: Diagnosis not present

## 2014-06-10 DIAGNOSIS — J9612 Chronic respiratory failure with hypercapnia: Secondary | ICD-10-CM | POA: Diagnosis not present

## 2014-06-10 DIAGNOSIS — Z930 Tracheostomy status: Secondary | ICD-10-CM | POA: Diagnosis not present

## 2014-06-10 DIAGNOSIS — F17210 Nicotine dependence, cigarettes, uncomplicated: Secondary | ICD-10-CM | POA: Diagnosis not present

## 2014-06-10 DIAGNOSIS — E662 Morbid (severe) obesity with alveolar hypoventilation: Secondary | ICD-10-CM | POA: Diagnosis not present

## 2014-06-12 ENCOUNTER — Telehealth: Payer: Self-pay

## 2014-06-12 DIAGNOSIS — F419 Anxiety disorder, unspecified: Secondary | ICD-10-CM | POA: Diagnosis not present

## 2014-06-12 DIAGNOSIS — E10319 Type 1 diabetes mellitus with unspecified diabetic retinopathy without macular edema: Secondary | ICD-10-CM | POA: Diagnosis not present

## 2014-06-12 DIAGNOSIS — F17210 Nicotine dependence, cigarettes, uncomplicated: Secondary | ICD-10-CM | POA: Diagnosis not present

## 2014-06-12 DIAGNOSIS — J9612 Chronic respiratory failure with hypercapnia: Secondary | ICD-10-CM | POA: Diagnosis not present

## 2014-06-12 DIAGNOSIS — Z9181 History of falling: Secondary | ICD-10-CM | POA: Diagnosis not present

## 2014-06-12 DIAGNOSIS — I10 Essential (primary) hypertension: Secondary | ICD-10-CM | POA: Diagnosis not present

## 2014-06-12 DIAGNOSIS — M6281 Muscle weakness (generalized): Secondary | ICD-10-CM | POA: Diagnosis not present

## 2014-06-12 DIAGNOSIS — E785 Hyperlipidemia, unspecified: Secondary | ICD-10-CM | POA: Diagnosis not present

## 2014-06-12 DIAGNOSIS — Z930 Tracheostomy status: Secondary | ICD-10-CM | POA: Diagnosis not present

## 2014-06-12 DIAGNOSIS — E1043 Type 1 diabetes mellitus with diabetic autonomic (poly)neuropathy: Secondary | ICD-10-CM | POA: Diagnosis not present

## 2014-06-12 DIAGNOSIS — E662 Morbid (severe) obesity with alveolar hypoventilation: Secondary | ICD-10-CM | POA: Diagnosis not present

## 2014-06-12 DIAGNOSIS — Z5181 Encounter for therapeutic drug level monitoring: Secondary | ICD-10-CM | POA: Diagnosis not present

## 2014-06-12 DIAGNOSIS — Z7901 Long term (current) use of anticoagulants: Secondary | ICD-10-CM | POA: Diagnosis not present

## 2014-06-12 DIAGNOSIS — J45909 Unspecified asthma, uncomplicated: Secondary | ICD-10-CM | POA: Diagnosis not present

## 2014-06-12 DIAGNOSIS — I5032 Chronic diastolic (congestive) heart failure: Secondary | ICD-10-CM | POA: Diagnosis not present

## 2014-06-12 DIAGNOSIS — M170 Bilateral primary osteoarthritis of knee: Secondary | ICD-10-CM | POA: Diagnosis not present

## 2014-06-12 DIAGNOSIS — D075 Carcinoma in situ of prostate: Secondary | ICD-10-CM | POA: Diagnosis not present

## 2014-06-12 DIAGNOSIS — I2699 Other pulmonary embolism without acute cor pulmonale: Secondary | ICD-10-CM | POA: Diagnosis not present

## 2014-06-12 NOTE — Telephone Encounter (Signed)
He will not be coming to his appt and does not want to reschedule.

## 2014-06-16 DIAGNOSIS — M6281 Muscle weakness (generalized): Secondary | ICD-10-CM | POA: Diagnosis not present

## 2014-06-16 DIAGNOSIS — Z930 Tracheostomy status: Secondary | ICD-10-CM | POA: Diagnosis not present

## 2014-06-16 DIAGNOSIS — E662 Morbid (severe) obesity with alveolar hypoventilation: Secondary | ICD-10-CM | POA: Diagnosis not present

## 2014-06-16 DIAGNOSIS — I2699 Other pulmonary embolism without acute cor pulmonale: Secondary | ICD-10-CM | POA: Diagnosis not present

## 2014-06-16 DIAGNOSIS — J45909 Unspecified asthma, uncomplicated: Secondary | ICD-10-CM | POA: Diagnosis not present

## 2014-06-16 DIAGNOSIS — I10 Essential (primary) hypertension: Secondary | ICD-10-CM | POA: Diagnosis not present

## 2014-06-16 DIAGNOSIS — I5032 Chronic diastolic (congestive) heart failure: Secondary | ICD-10-CM | POA: Diagnosis not present

## 2014-06-16 DIAGNOSIS — M170 Bilateral primary osteoarthritis of knee: Secondary | ICD-10-CM | POA: Diagnosis not present

## 2014-06-16 DIAGNOSIS — D075 Carcinoma in situ of prostate: Secondary | ICD-10-CM | POA: Diagnosis not present

## 2014-06-16 DIAGNOSIS — E1043 Type 1 diabetes mellitus with diabetic autonomic (poly)neuropathy: Secondary | ICD-10-CM | POA: Diagnosis not present

## 2014-06-16 DIAGNOSIS — F17210 Nicotine dependence, cigarettes, uncomplicated: Secondary | ICD-10-CM | POA: Diagnosis not present

## 2014-06-16 DIAGNOSIS — E785 Hyperlipidemia, unspecified: Secondary | ICD-10-CM | POA: Diagnosis not present

## 2014-06-16 DIAGNOSIS — E10319 Type 1 diabetes mellitus with unspecified diabetic retinopathy without macular edema: Secondary | ICD-10-CM | POA: Diagnosis not present

## 2014-06-16 DIAGNOSIS — Z5181 Encounter for therapeutic drug level monitoring: Secondary | ICD-10-CM | POA: Diagnosis not present

## 2014-06-16 DIAGNOSIS — Z9181 History of falling: Secondary | ICD-10-CM | POA: Diagnosis not present

## 2014-06-16 DIAGNOSIS — Z7901 Long term (current) use of anticoagulants: Secondary | ICD-10-CM | POA: Diagnosis not present

## 2014-06-16 DIAGNOSIS — J9612 Chronic respiratory failure with hypercapnia: Secondary | ICD-10-CM | POA: Diagnosis not present

## 2014-06-16 DIAGNOSIS — F419 Anxiety disorder, unspecified: Secondary | ICD-10-CM | POA: Diagnosis not present

## 2014-06-22 ENCOUNTER — Ambulatory Visit (INDEPENDENT_AMBULATORY_CARE_PROVIDER_SITE_OTHER): Payer: Medicaid Other | Admitting: General Practice

## 2014-06-22 DIAGNOSIS — I2699 Other pulmonary embolism without acute cor pulmonale: Secondary | ICD-10-CM

## 2014-06-22 DIAGNOSIS — Z5181 Encounter for therapeutic drug level monitoring: Secondary | ICD-10-CM

## 2014-06-22 LAB — POCT INR: INR: 2.7

## 2014-06-22 NOTE — Progress Notes (Signed)
Pre visit review using our clinic review tool, if applicable. No additional management support is needed unless otherwise documented below in the visit note. 

## 2014-06-23 ENCOUNTER — Telehealth: Payer: Self-pay

## 2014-06-23 NOTE — Telephone Encounter (Signed)
Needs a diff pain medication due to his pain in leg. Call via the physical therapist

## 2014-06-25 ENCOUNTER — Telehealth: Payer: Self-pay | Admitting: Endocrinology

## 2014-06-25 NOTE — Telephone Encounter (Signed)
Calling to let us know he has been discharged from PT and [facility] goals have been met

## 2014-06-25 NOTE — Telephone Encounter (Signed)
Please read message below.  

## 2014-06-25 NOTE — Telephone Encounter (Signed)
Pt is also still in pain even with the oxy

## 2014-06-25 NOTE — Telephone Encounter (Signed)
i need to know whom the caller is.

## 2014-06-29 NOTE — Telephone Encounter (Signed)
Travis Chapman with [facility] was calling to give the information listed below.

## 2014-06-29 NOTE — Telephone Encounter (Signed)
[facility] advised of note below.

## 2014-06-29 NOTE — Telephone Encounter (Signed)
Please advise nurse with [facility] that pt has been d/c'ed from the practice.

## 2014-07-06 ENCOUNTER — Ambulatory Visit (INDEPENDENT_AMBULATORY_CARE_PROVIDER_SITE_OTHER): Payer: Medicaid Other | Admitting: General Practice

## 2014-07-06 DIAGNOSIS — Z5181 Encounter for therapeutic drug level monitoring: Secondary | ICD-10-CM

## 2014-07-06 DIAGNOSIS — I2699 Other pulmonary embolism without acute cor pulmonale: Secondary | ICD-10-CM

## 2014-10-01 ENCOUNTER — Encounter: Payer: Self-pay | Admitting: Internal Medicine

## 2015-04-12 IMAGING — CR DG CHEST 2V
2 series · 2 of 2 positions shown · non-contrast
Comparison: 08/03/2013.

CLINICAL DATA: Fall.  Hypertension.

EXAM:
CHEST  2 VIEW

[w chest lat]
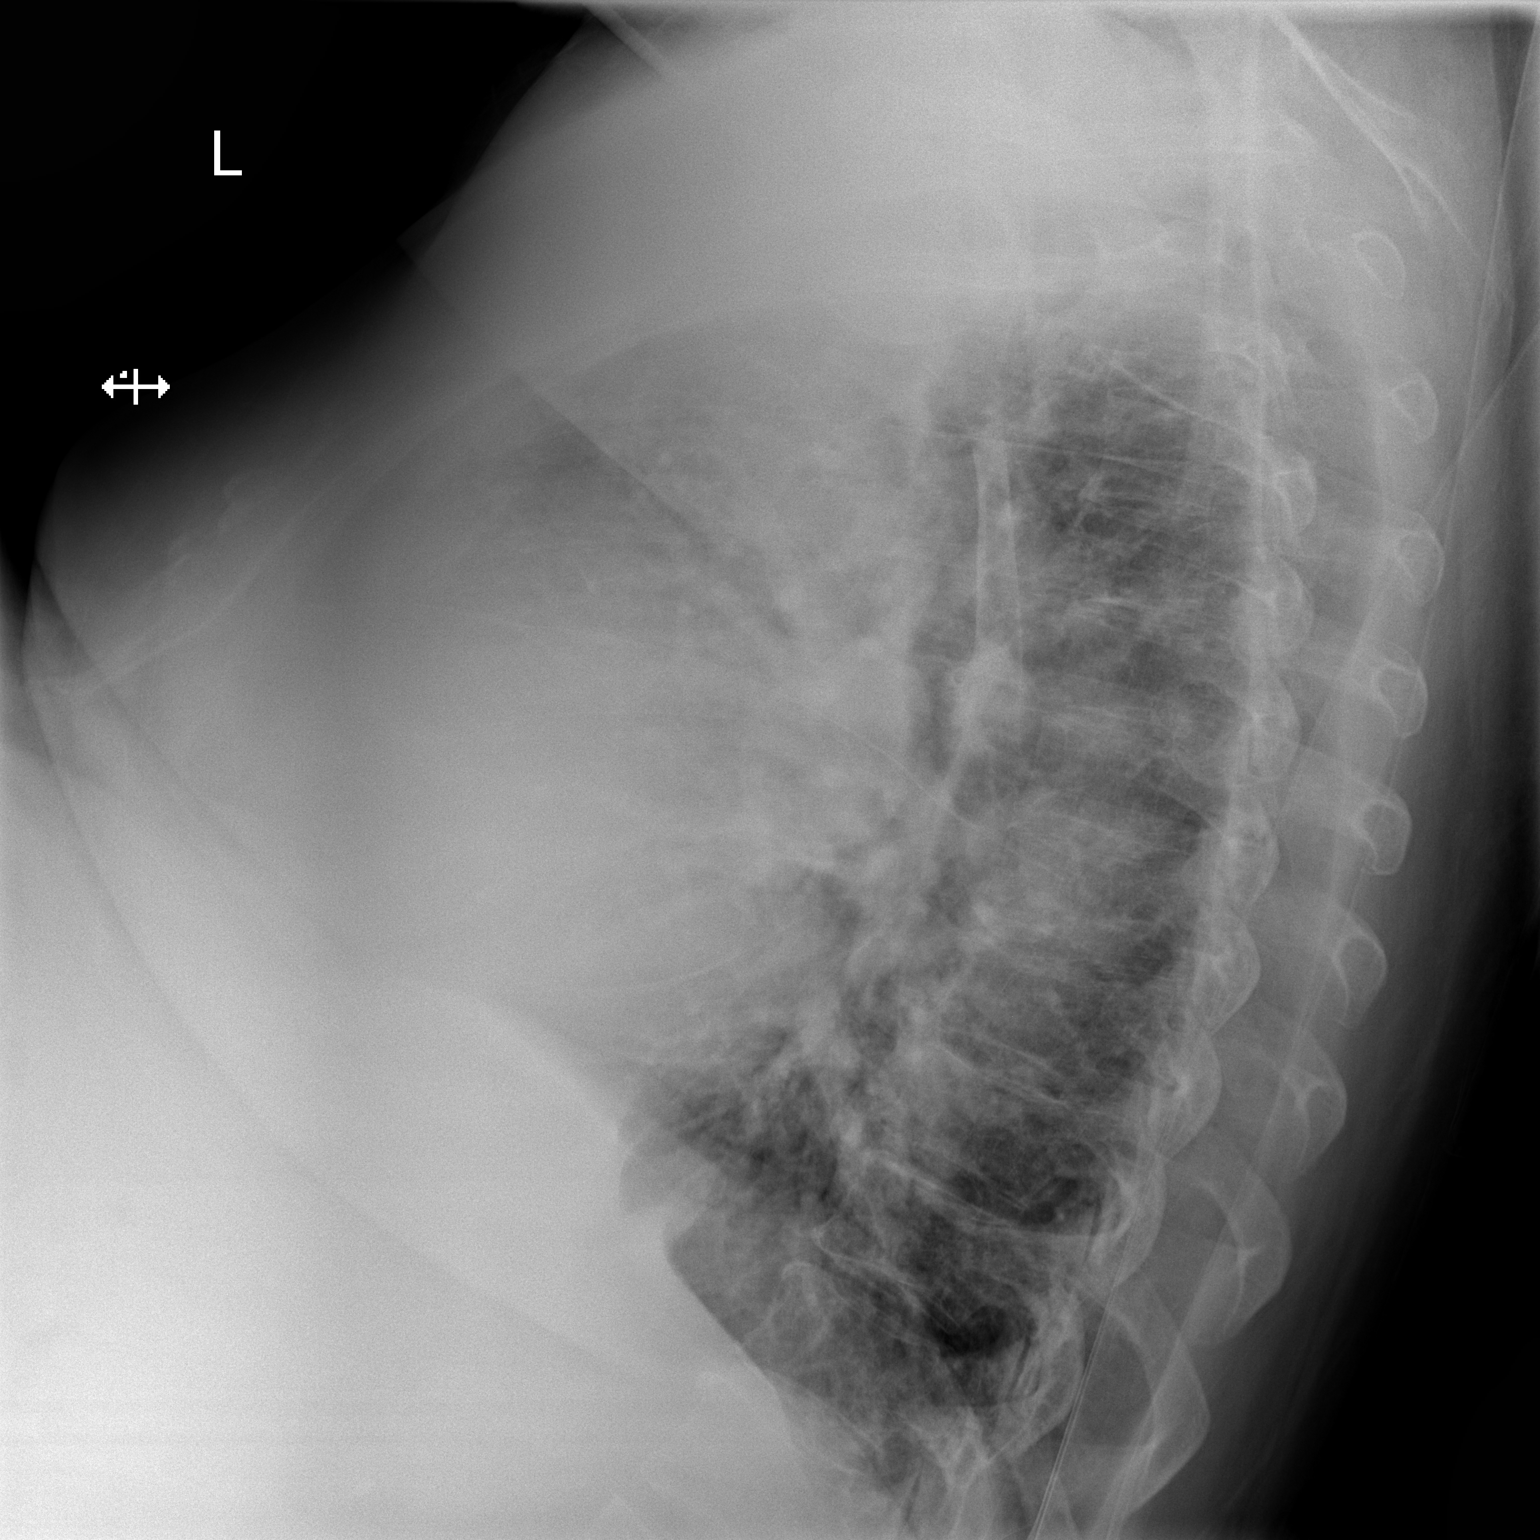

[x chest ap]
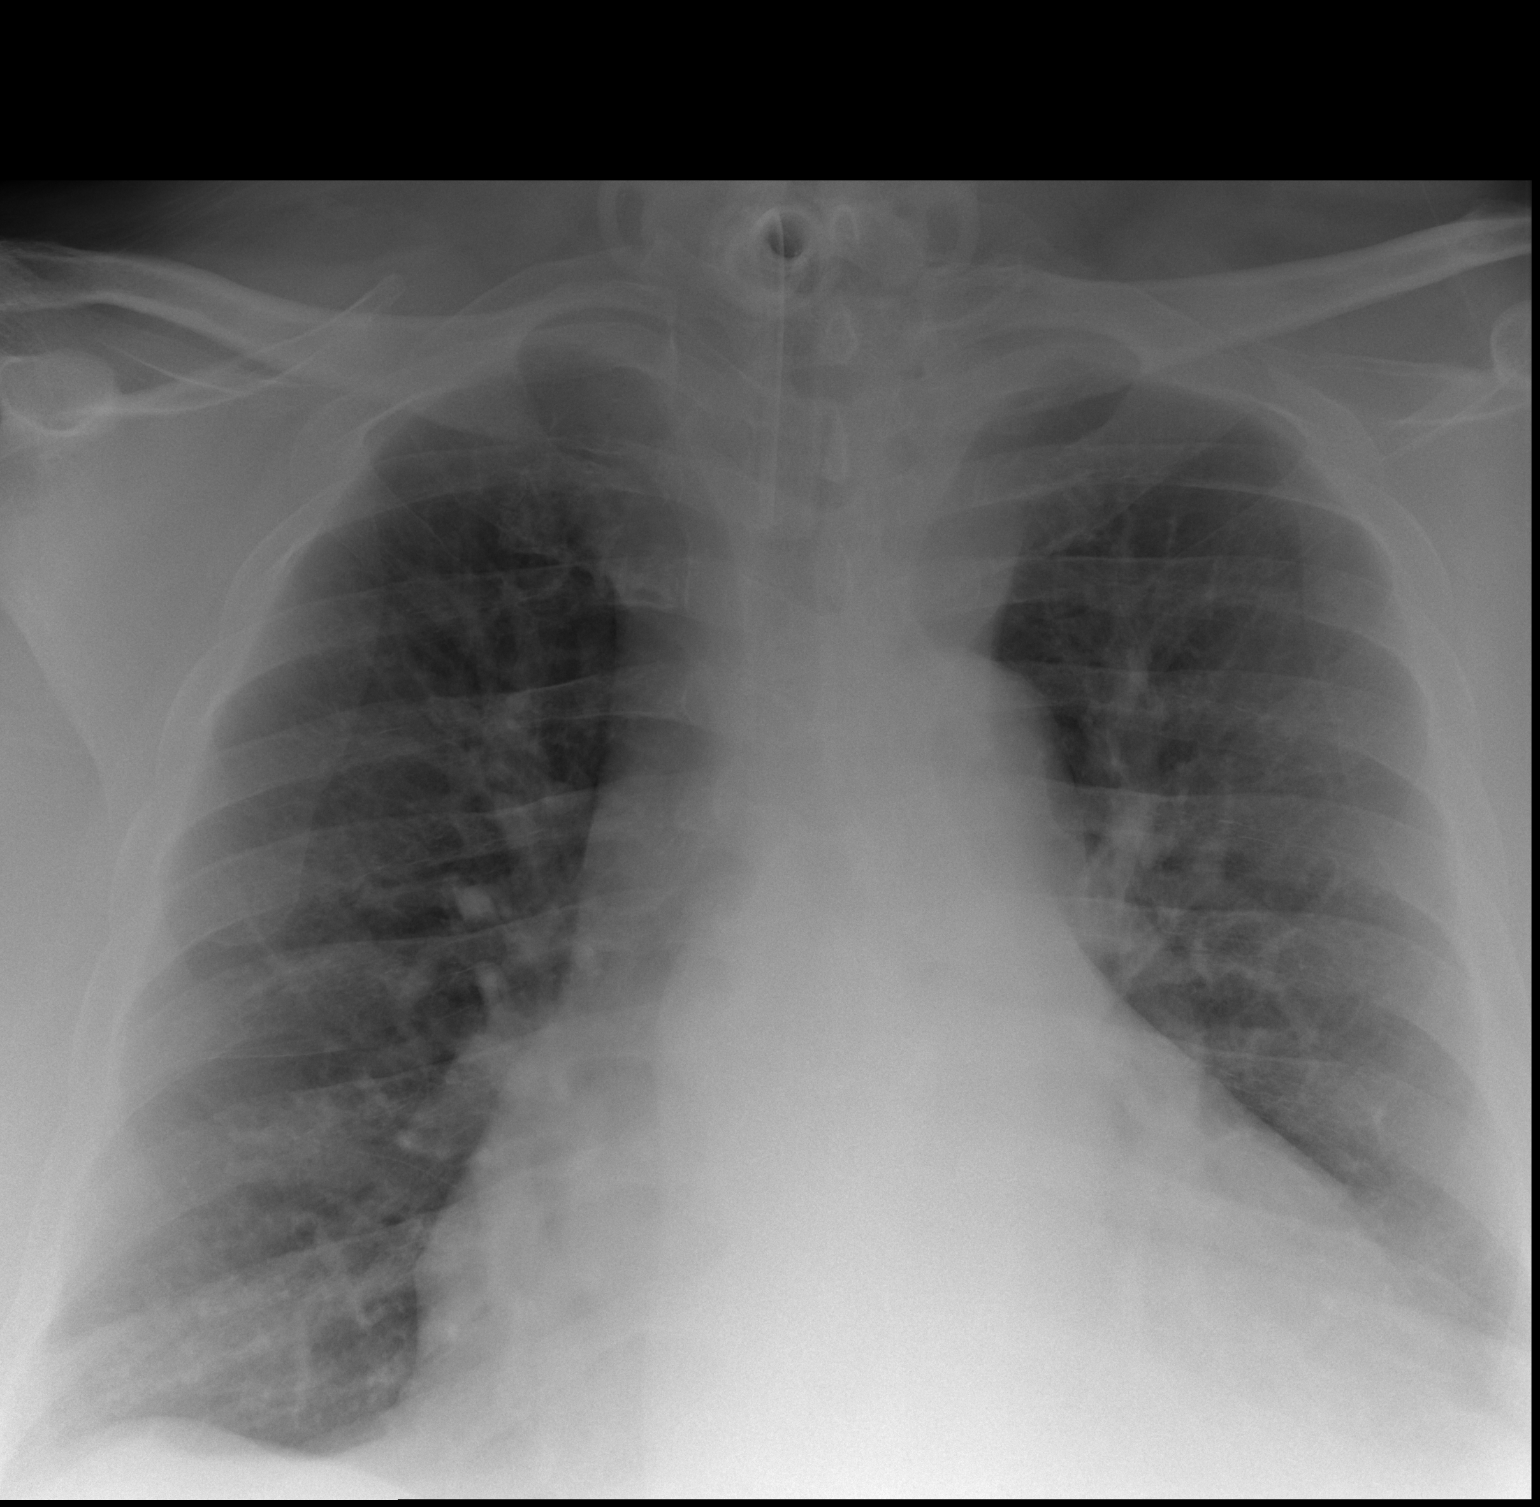

[2 of 2 positions shown; findings below may reference images not displayed]

FINDINGS: Tracheostomy in stable position. Stable cardiomegaly and pulmonary
vascular congestion. No focal infiltrates identified. Costophrenic
angles not imaged. No pleural effusion however noted. No
pneumothorax. No acute bony abnormality.
IMPRESSION: 1. Tracheostomy in stable position.
2. Stable cardiomegaly and pulmonary venous congestion.

## 2015-04-12 IMAGING — CR DG KNEE COMPLETE 4+V*R*
4 series · 4 of 4 positions shown · non-contrast
Comparison: March 24, 2013.

CLINICAL DATA: Pain post trauma

EXAM:
RIGHT KNEE - COMPLETE 4+ VIEW

[x knee lat right]
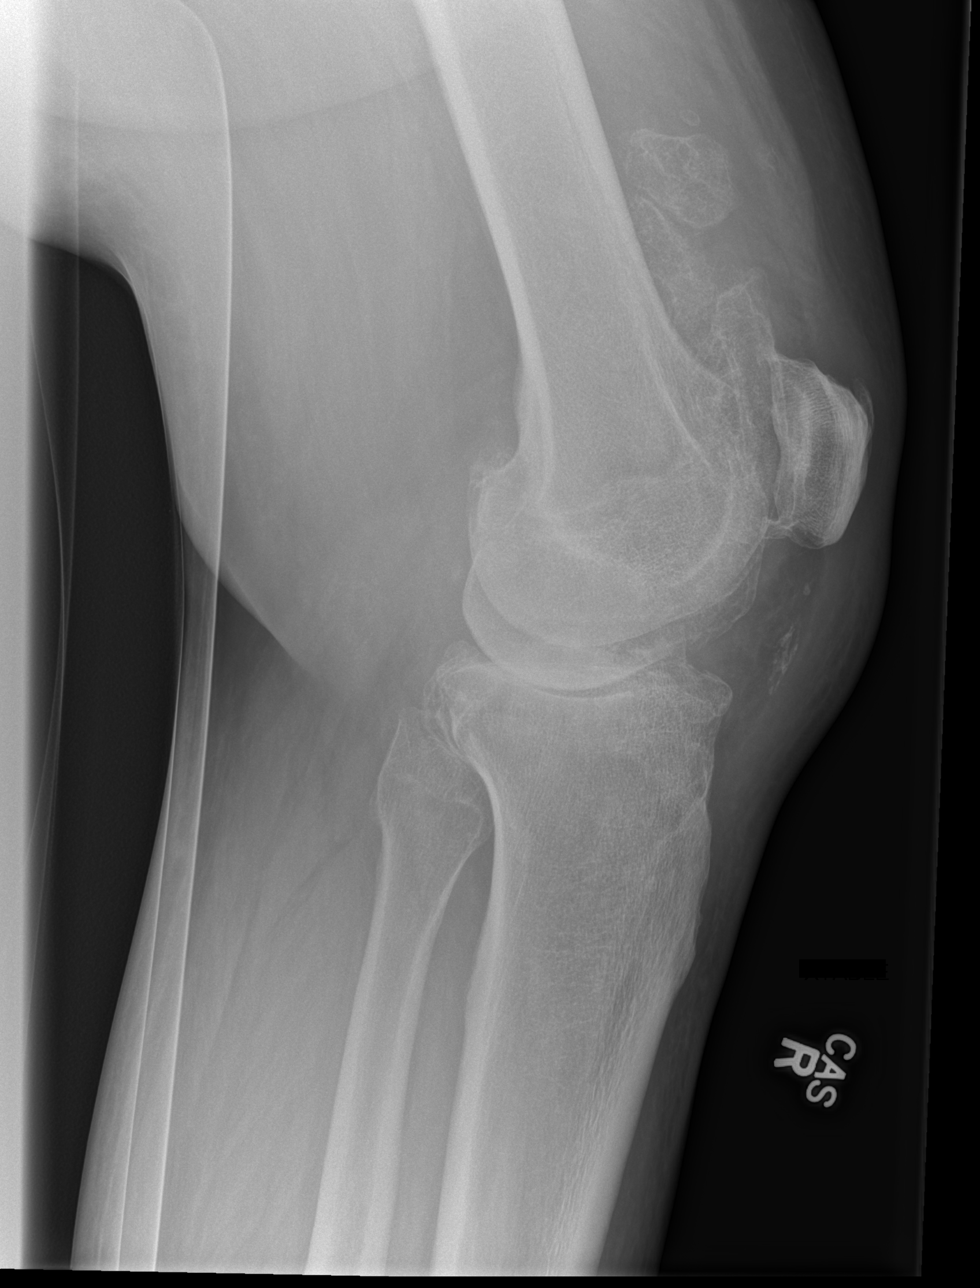

[x knee ap right]
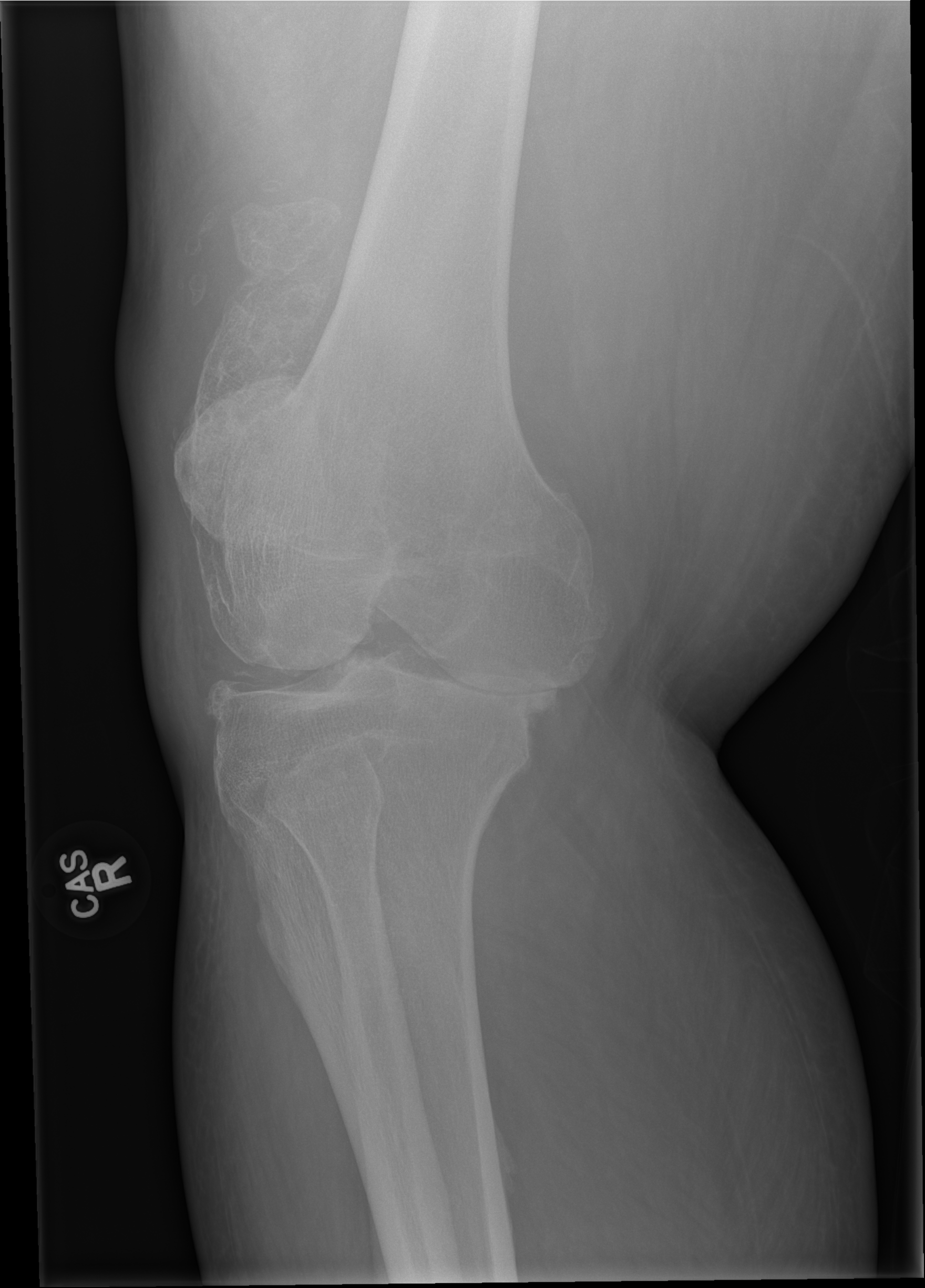

[x knee obl right (1 of 2)]
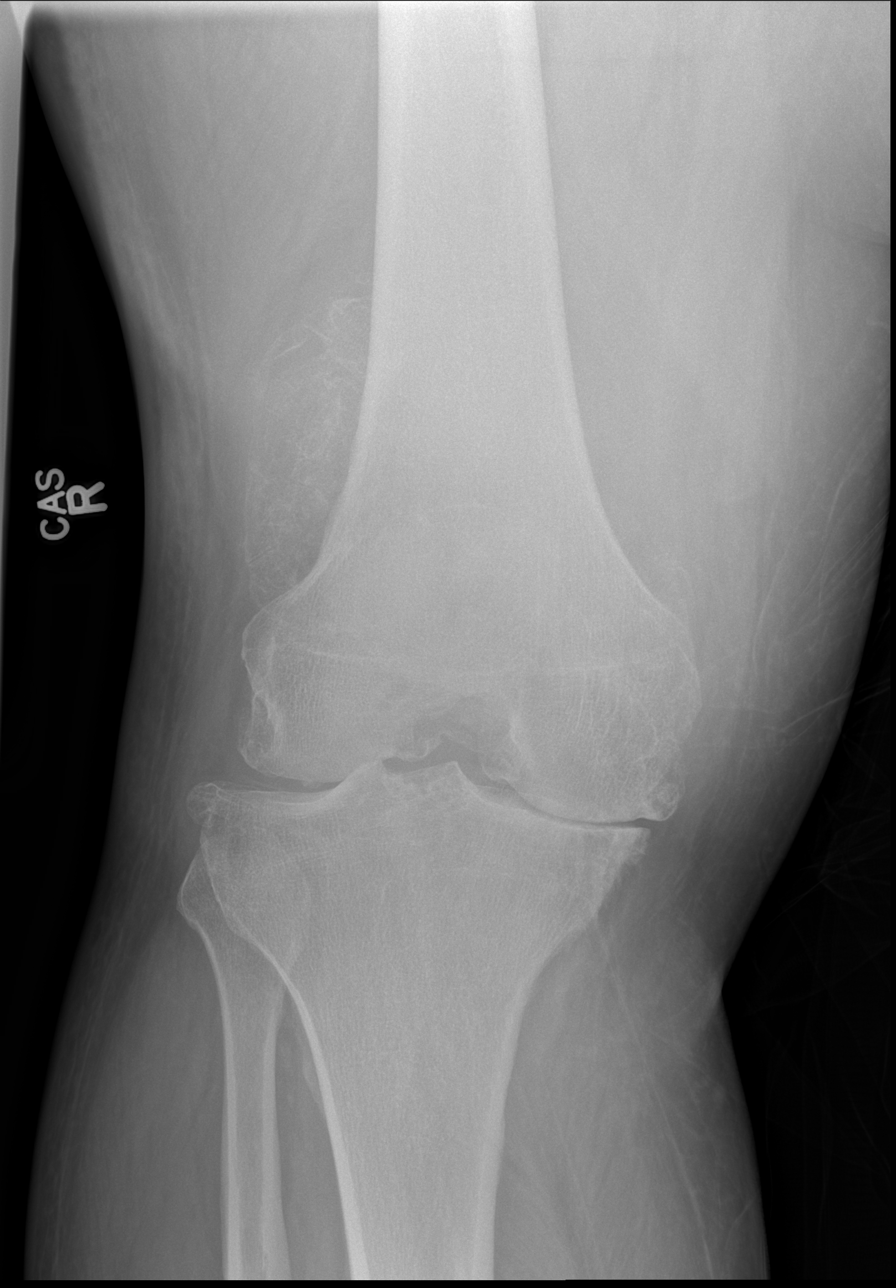

[x knee obl right (2 of 2)]
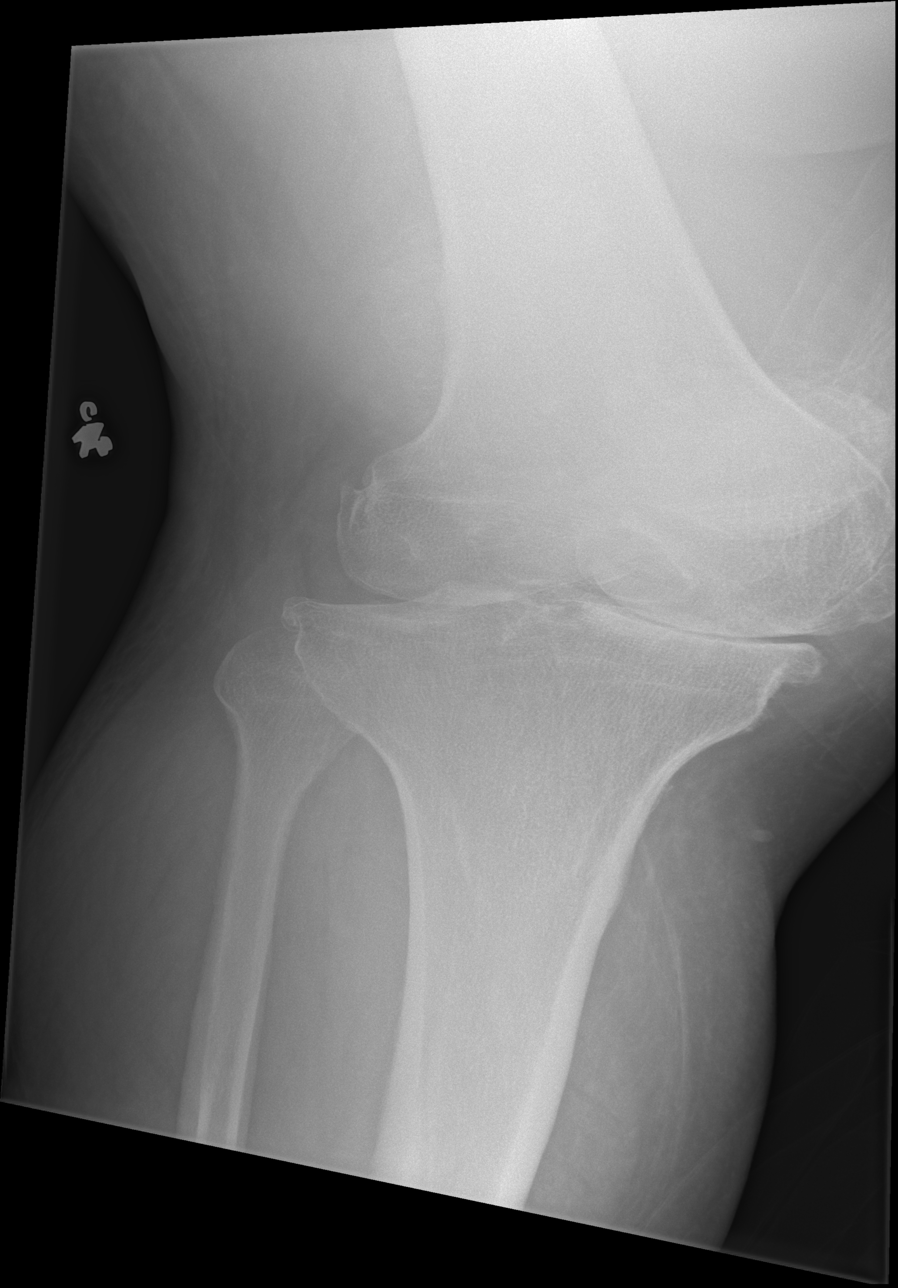

[4 of 4 positions shown; findings below may reference images not displayed]

FINDINGS: Frontal, lateral, and bilateral oblique views were obtained. There
is no fracture or dislocation. There are multiple calcifications in
the suprapatellar bursa. A frank joint effusion is not seen at this
time. There is advanced osteoarthritic change with narrowing in all
compartments, most marked medially. There is spurring in all
compartments.
IMPRESSION: Advanced osteoarthritis, most marked medially. Extensive
calcification in the suprapatellar bursa, probably indicative of
synovial chondromatosis. No acute fracture or dislocation.

## 2015-09-08 ENCOUNTER — Encounter: Payer: Self-pay | Admitting: Family Medicine

## 2016-02-11 ENCOUNTER — Other Ambulatory Visit: Payer: Self-pay | Admitting: Nephrology

## 2016-02-11 DIAGNOSIS — N179 Acute kidney failure, unspecified: Secondary | ICD-10-CM

## 2016-02-14 ENCOUNTER — Other Ambulatory Visit: Payer: Medicaid Other

## 2016-02-16 ENCOUNTER — Other Ambulatory Visit: Payer: Medicaid Other

## 2016-02-24 ENCOUNTER — Ambulatory Visit
Admission: RE | Admit: 2016-02-24 | Discharge: 2016-02-24 | Disposition: A | Discharge status: Home / Self Care | Payer: Medicare Other | Source: Ambulatory Visit | Attending: Nephrology | Admitting: Nephrology

## 2016-02-24 DIAGNOSIS — N179 Acute kidney failure, unspecified: Secondary | ICD-10-CM

## 2017-01-27 ENCOUNTER — Inpatient Hospital Stay (HOSPITAL_COMMUNITY): Payer: Medicare Other

## 2017-01-27 ENCOUNTER — Emergency Department (HOSPITAL_COMMUNITY): Payer: Medicare Other

## 2017-01-27 ENCOUNTER — Encounter (HOSPITAL_COMMUNITY): Payer: Self-pay | Admitting: Emergency Medicine

## 2017-01-27 ENCOUNTER — Other Ambulatory Visit: Payer: Self-pay

## 2017-01-27 ENCOUNTER — Inpatient Hospital Stay (HOSPITAL_COMMUNITY)
Admission: EM | Admit: 2017-01-27 | Discharge: 2017-02-16 | DRG: 208 | Disposition: E | Payer: Medicare Other | Attending: Critical Care Medicine | Admitting: Critical Care Medicine

## 2017-01-27 DIAGNOSIS — Z86711 Personal history of pulmonary embolism: Secondary | ICD-10-CM

## 2017-01-27 DIAGNOSIS — J45909 Unspecified asthma, uncomplicated: Secondary | ICD-10-CM | POA: Diagnosis present

## 2017-01-27 DIAGNOSIS — G931 Anoxic brain damage, not elsewhere classified: Secondary | ICD-10-CM | POA: Diagnosis not present

## 2017-01-27 DIAGNOSIS — E11319 Type 2 diabetes mellitus with unspecified diabetic retinopathy without macular edema: Secondary | ICD-10-CM | POA: Diagnosis present

## 2017-01-27 DIAGNOSIS — I5032 Chronic diastolic (congestive) heart failure: Secondary | ICD-10-CM | POA: Diagnosis present

## 2017-01-27 DIAGNOSIS — I10 Essential (primary) hypertension: Secondary | ICD-10-CM | POA: Diagnosis not present

## 2017-01-27 DIAGNOSIS — R402212 Coma scale, best verbal response, none, at arrival to emergency department: Secondary | ICD-10-CM | POA: Diagnosis present

## 2017-01-27 DIAGNOSIS — Z7901 Long term (current) use of anticoagulants: Secondary | ICD-10-CM

## 2017-01-27 DIAGNOSIS — Z8249 Family history of ischemic heart disease and other diseases of the circulatory system: Secondary | ICD-10-CM

## 2017-01-27 DIAGNOSIS — J9509 Other tracheostomy complication: Principal | ICD-10-CM | POA: Diagnosis present

## 2017-01-27 DIAGNOSIS — Z6841 Body Mass Index (BMI) 40.0 and over, adult: Secondary | ICD-10-CM | POA: Diagnosis not present

## 2017-01-27 DIAGNOSIS — J9602 Acute respiratory failure with hypercapnia: Secondary | ICD-10-CM

## 2017-01-27 DIAGNOSIS — H5462 Unqualified visual loss, left eye, normal vision right eye: Secondary | ICD-10-CM | POA: Diagnosis present

## 2017-01-27 DIAGNOSIS — E7800 Pure hypercholesterolemia, unspecified: Secondary | ICD-10-CM | POA: Diagnosis present

## 2017-01-27 DIAGNOSIS — Z4659 Encounter for fitting and adjustment of other gastrointestinal appliance and device: Secondary | ICD-10-CM

## 2017-01-27 DIAGNOSIS — R402312 Coma scale, best motor response, none, at arrival to emergency department: Secondary | ICD-10-CM | POA: Diagnosis present

## 2017-01-27 DIAGNOSIS — R569 Unspecified convulsions: Secondary | ICD-10-CM | POA: Diagnosis not present

## 2017-01-27 DIAGNOSIS — Z66 Do not resuscitate: Secondary | ICD-10-CM | POA: Diagnosis present

## 2017-01-27 DIAGNOSIS — E662 Morbid (severe) obesity with alveolar hypoventilation: Secondary | ICD-10-CM | POA: Diagnosis present

## 2017-01-27 DIAGNOSIS — I469 Cardiac arrest, cause unspecified: Secondary | ICD-10-CM | POA: Diagnosis present

## 2017-01-27 DIAGNOSIS — Z87891 Personal history of nicotine dependence: Secondary | ICD-10-CM

## 2017-01-27 DIAGNOSIS — E785 Hyperlipidemia, unspecified: Secondary | ICD-10-CM | POA: Diagnosis present

## 2017-01-27 DIAGNOSIS — N189 Chronic kidney disease, unspecified: Secondary | ICD-10-CM | POA: Diagnosis present

## 2017-01-27 DIAGNOSIS — I130 Hypertensive heart and chronic kidney disease with heart failure and stage 1 through stage 4 chronic kidney disease, or unspecified chronic kidney disease: Secondary | ICD-10-CM | POA: Diagnosis present

## 2017-01-27 DIAGNOSIS — E1122 Type 2 diabetes mellitus with diabetic chronic kidney disease: Secondary | ICD-10-CM | POA: Diagnosis present

## 2017-01-27 DIAGNOSIS — E872 Acidosis: Secondary | ICD-10-CM | POA: Diagnosis present

## 2017-01-27 DIAGNOSIS — N183 Chronic kidney disease, stage 3 (moderate): Secondary | ICD-10-CM | POA: Diagnosis not present

## 2017-01-27 DIAGNOSIS — G9341 Metabolic encephalopathy: Secondary | ICD-10-CM | POA: Diagnosis present

## 2017-01-27 DIAGNOSIS — Z794 Long term (current) use of insulin: Secondary | ICD-10-CM

## 2017-01-27 DIAGNOSIS — J690 Pneumonitis due to inhalation of food and vomit: Secondary | ICD-10-CM | POA: Diagnosis present

## 2017-01-27 DIAGNOSIS — Z9911 Dependence on respirator [ventilator] status: Secondary | ICD-10-CM

## 2017-01-27 DIAGNOSIS — N179 Acute kidney failure, unspecified: Secondary | ICD-10-CM | POA: Diagnosis present

## 2017-01-27 DIAGNOSIS — Z79899 Other long term (current) drug therapy: Secondary | ICD-10-CM

## 2017-01-27 DIAGNOSIS — Z9289 Personal history of other medical treatment: Secondary | ICD-10-CM

## 2017-01-27 DIAGNOSIS — R402112 Coma scale, eyes open, never, at arrival to emergency department: Secondary | ICD-10-CM | POA: Diagnosis present

## 2017-01-27 DIAGNOSIS — G40901 Epilepsy, unspecified, not intractable, with status epilepticus: Secondary | ICD-10-CM | POA: Diagnosis not present

## 2017-01-27 DIAGNOSIS — Z833 Family history of diabetes mellitus: Secondary | ICD-10-CM

## 2017-01-27 DIAGNOSIS — J9601 Acute respiratory failure with hypoxia: Secondary | ICD-10-CM | POA: Diagnosis present

## 2017-01-27 DIAGNOSIS — I4901 Ventricular fibrillation: Secondary | ICD-10-CM | POA: Diagnosis present

## 2017-01-27 DIAGNOSIS — F1011 Alcohol abuse, in remission: Secondary | ICD-10-CM | POA: Diagnosis present

## 2017-01-27 DIAGNOSIS — J9690 Respiratory failure, unspecified, unspecified whether with hypoxia or hypercapnia: Secondary | ICD-10-CM

## 2017-01-27 DIAGNOSIS — I5030 Unspecified diastolic (congestive) heart failure: Secondary | ICD-10-CM

## 2017-01-27 DIAGNOSIS — E1140 Type 2 diabetes mellitus with diabetic neuropathy, unspecified: Secondary | ICD-10-CM | POA: Diagnosis present

## 2017-01-27 DIAGNOSIS — E875 Hyperkalemia: Secondary | ICD-10-CM | POA: Diagnosis present

## 2017-01-27 DIAGNOSIS — Z0189 Encounter for other specified special examinations: Secondary | ICD-10-CM

## 2017-01-27 DIAGNOSIS — J9600 Acute respiratory failure, unspecified whether with hypoxia or hypercapnia: Secondary | ICD-10-CM | POA: Diagnosis not present

## 2017-01-27 LAB — I-STAT CHEM 8, ED
BUN: 50 mg/dL — AB (ref 6–20)
CALCIUM ION: 1.05 mmol/L — AB (ref 1.15–1.40)
CHLORIDE: 109 mmol/L (ref 101–111)
CREATININE: 4.3 mg/dL — AB (ref 0.61–1.24)
Glucose, Bld: 250 mg/dL — ABNORMAL HIGH (ref 65–99)
HCT: 33 % — ABNORMAL LOW (ref 39.0–52.0)
Hemoglobin: 11.2 g/dL — ABNORMAL LOW (ref 13.0–17.0)
Potassium: 5.2 mmol/L — ABNORMAL HIGH (ref 3.5–5.1)
SODIUM: 140 mmol/L (ref 135–145)
TCO2: 19 mmol/L — AB (ref 22–32)

## 2017-01-27 LAB — TROPONIN I
TROPONIN I: 0.08 ng/mL — AB (ref <0.03)
TROPONIN I: 0.08 ng/mL — AB (ref <0.03)
Troponin I: <0.03 ng/mL (ref <0.03)
Troponin I: 0.06 ng/mL (ref <0.03)

## 2017-01-27 LAB — COMPREHENSIVE METABOLIC PANEL
ALBUMIN: 3.2 g/dL — AB (ref 3.5–5.0)
ALK PHOS: 88 U/L (ref 38–126)
ALT: 37 U/L (ref 17–63)
ANION GAP: 14 (ref 5–15)
AST: 47 U/L — ABNORMAL HIGH (ref 15–41)
BUN: 50 mg/dL — ABNORMAL HIGH (ref 6–20)
CALCIUM: 8.1 mg/dL — AB (ref 8.9–10.3)
CHLORIDE: 107 mmol/L (ref 101–111)
CO2: 14 mmol/L — ABNORMAL LOW (ref 22–32)
Creatinine, Ser: 4.45 mg/dL — ABNORMAL HIGH (ref 0.61–1.24)
GFR calc non Af Amer: 12 mL/min — ABNORMAL LOW (ref >60)
GFR, EST AFRICAN AMERICAN: 14 mL/min — AB (ref >60)
GLUCOSE: 257 mg/dL — AB (ref 65–99)
Potassium: 5.1 mmol/L (ref 3.5–5.1)
SODIUM: 135 mmol/L (ref 135–145)
Total Bilirubin: 0.6 mg/dL (ref 0.3–1.2)
Total Protein: 6.9 g/dL (ref 6.5–8.1)

## 2017-01-27 LAB — CBC WITH DIFFERENTIAL/PLATELET
Basophils Absolute: 0 10*3/uL (ref 0.0–0.1)
Basophils Relative: 0 %
EOS ABS: 0.1 10*3/uL (ref 0.0–0.7)
EOS PCT: 1 %
HCT: 33.6 % — ABNORMAL LOW (ref 39.0–52.0)
HEMOGLOBIN: 10.4 g/dL — AB (ref 13.0–17.0)
Lymphocytes Relative: 27 %
Lymphs Abs: 1.7 10*3/uL (ref 0.7–4.0)
MCH: 27.4 pg (ref 26.0–34.0)
MCHC: 31 g/dL (ref 30.0–36.0)
MCV: 88.4 fL (ref 78.0–100.0)
Monocytes Absolute: 0.2 10*3/uL (ref 0.1–1.0)
Monocytes Relative: 3 %
NEUTROS PCT: 69 %
Neutro Abs: 4.2 10*3/uL (ref 1.7–7.7)
PLATELETS: 169 10*3/uL (ref 150–400)
RBC: 3.8 MIL/uL — AB (ref 4.22–5.81)
RDW: 15.4 % (ref 11.5–15.5)
WBC: 6.1 10*3/uL (ref 4.0–10.5)

## 2017-01-27 LAB — RESPIRATORY PANEL BY PCR
Adenovirus: NOT DETECTED
BORDETELLA PERTUSSIS-RVPCR: NOT DETECTED
CORONAVIRUS 229E-RVPPCR: NOT DETECTED
CORONAVIRUS HKU1-RVPPCR: NOT DETECTED
CORONAVIRUS NL63-RVPPCR: NOT DETECTED
CORONAVIRUS OC43-RVPPCR: NOT DETECTED
Chlamydophila pneumoniae: NOT DETECTED
Influenza A: NOT DETECTED
Influenza B: NOT DETECTED
METAPNEUMOVIRUS-RVPPCR: NOT DETECTED
Mycoplasma pneumoniae: NOT DETECTED
PARAINFLUENZA VIRUS 1-RVPPCR: NOT DETECTED
PARAINFLUENZA VIRUS 2-RVPPCR: NOT DETECTED
Parainfluenza Virus 3: NOT DETECTED
Parainfluenza Virus 4: NOT DETECTED
RHINOVIRUS / ENTEROVIRUS - RVPPCR: NOT DETECTED
Respiratory Syncytial Virus: NOT DETECTED

## 2017-01-27 LAB — BASIC METABOLIC PANEL
ANION GAP: 13 (ref 5–15)
ANION GAP: 13 (ref 5–15)
BUN: 54 mg/dL — ABNORMAL HIGH (ref 6–20)
BUN: 65 mg/dL — ABNORMAL HIGH (ref 6–20)
CALCIUM: 7.7 mg/dL — AB (ref 8.9–10.3)
CALCIUM: 7.4 mg/dL — AB (ref 8.9–10.3)
CO2: 15 mmol/L — ABNORMAL LOW (ref 22–32)
CO2: 16 mmol/L — ABNORMAL LOW (ref 22–32)
CREATININE: 4.67 mg/dL — AB (ref 0.61–1.24)
Chloride: 108 mmol/L (ref 101–111)
Chloride: 111 mmol/L (ref 101–111)
Creatinine, Ser: 4.48 mg/dL — ABNORMAL HIGH (ref 0.61–1.24)
GFR calc Af Amer: 14 mL/min — ABNORMAL LOW (ref >60)
GFR calc Af Amer: 13 mL/min — ABNORMAL LOW (ref >60)
GFR, EST NON AFRICAN AMERICAN: 12 mL/min — AB (ref >60)
GFR, EST NON AFRICAN AMERICAN: 12 mL/min — AB (ref >60)
GLUCOSE: 271 mg/dL — AB (ref 65–99)
GLUCOSE: 162 mg/dL — AB (ref 65–99)
Potassium: 6.4 mmol/L (ref 3.5–5.1)
Potassium: 4.6 mmol/L (ref 3.5–5.1)
Sodium: 136 mmol/L (ref 135–145)
Sodium: 140 mmol/L (ref 135–145)

## 2017-01-27 LAB — BLOOD GAS, ARTERIAL
Acid-base deficit: 10.5 mmol/L — ABNORMAL HIGH (ref 0.0–2.0)
Bicarbonate: 15.7 mmol/L — ABNORMAL LOW (ref 20.0–28.0)
DRAWN BY: 511471
FIO2: 60
LHR: 20 {breaths}/min
MECHVT: 600 mL
O2 Saturation: 98.9 %
PATIENT TEMPERATURE: 93.5
PEEP: 5 cmH2O
PO2 ART: 264 mmHg — AB (ref 83.0–108.0)
pCO2 arterial: 34.9 mmHg (ref 32.0–48.0)
pH, Arterial: 7.257 — ABNORMAL LOW (ref 7.350–7.450)

## 2017-01-27 LAB — ECHOCARDIOGRAM COMPLETE
Height: 75 in
Weight: 6342.19 oz

## 2017-01-27 LAB — URINALYSIS, ROUTINE W REFLEX MICROSCOPIC
BILIRUBIN URINE: NEGATIVE
Ketones, ur: 5 mg/dL — AB
LEUKOCYTES UA: NEGATIVE
NITRITE: NEGATIVE
Protein, ur: 100 mg/dL — AB
SPECIFIC GRAVITY, URINE: 1.01 (ref 1.005–1.030)
pH: 5 (ref 5.0–8.0)

## 2017-01-27 LAB — PROTIME-INR
INR: 1.2
INR: 1.49
PROTHROMBIN TIME: 17.9 s — AB (ref 11.4–15.2)
Prothrombin Time: 15.1 seconds (ref 11.4–15.2)

## 2017-01-27 LAB — MAGNESIUM: Magnesium: 2.2 mg/dL (ref 1.7–2.4)

## 2017-01-27 LAB — PROCALCITONIN: Procalcitonin: <0.1 ng/mL

## 2017-01-27 LAB — POCT I-STAT 4, (NA,K, GLUC, HGB,HCT)
Glucose, Bld: 299 mg/dL — ABNORMAL HIGH (ref 65–99)
HCT: 32 % — ABNORMAL LOW (ref 39.0–52.0)
Hemoglobin: 10.9 g/dL — ABNORMAL LOW (ref 13.0–17.0)
POTASSIUM: 5.4 mmol/L — AB (ref 3.5–5.1)
SODIUM: 141 mmol/L (ref 135–145)

## 2017-01-27 LAB — GLUCOSE, CAPILLARY
GLUCOSE-CAPILLARY: 282 mg/dL — AB (ref 65–99)
GLUCOSE-CAPILLARY: 237 mg/dL — AB (ref 65–99)
GLUCOSE-CAPILLARY: 151 mg/dL — AB (ref 65–99)
Glucose-Capillary: 156 mg/dL — ABNORMAL HIGH (ref 65–99)

## 2017-01-27 LAB — RAPID URINE DRUG SCREEN, HOSP PERFORMED
AMPHETAMINES: NOT DETECTED
BARBITURATES: NOT DETECTED
Benzodiazepines: POSITIVE — AB
Cocaine: NOT DETECTED
Opiates: NOT DETECTED
TETRAHYDROCANNABINOL: NOT DETECTED

## 2017-01-27 LAB — I-STAT TROPONIN, ED: Troponin i, poc: 0.03 ng/mL (ref 0.00–0.08)

## 2017-01-27 LAB — APTT
aPTT: 28 seconds (ref 24–36)
aPTT: 194 seconds (ref 24–36)

## 2017-01-27 LAB — I-STAT ARTERIAL BLOOD GAS, ED
ACID-BASE DEFICIT: 13 mmol/L — AB (ref 0.0–2.0)
BICARBONATE: 16.5 mmol/L — AB (ref 20.0–28.0)
O2 Saturation: 99 %
PO2 ART: 208 mmHg — AB (ref 83.0–108.0)
TCO2: 18 mmol/L — ABNORMAL LOW (ref 22–32)
pCO2 arterial: 51.8 mmHg — ABNORMAL HIGH (ref 32.0–48.0)
pH, Arterial: 7.113 — CL (ref 7.350–7.450)

## 2017-01-27 LAB — LACTIC ACID, PLASMA
Lactic Acid, Venous: 3 mmol/L (ref 0.5–1.9)
Lactic Acid, Venous: 2.2 mmol/L (ref 0.5–1.9)

## 2017-01-27 LAB — ETHANOL: Alcohol, Ethyl (B): <10 mg/dL (ref <10)

## 2017-01-27 LAB — TYPE AND SCREEN
ABO/RH(D): B POS
ANTIBODY SCREEN: NEGATIVE

## 2017-01-27 LAB — INFLUENZA PANEL BY PCR (TYPE A & B)
INFLAPCR: NEGATIVE
INFLBPCR: NEGATIVE

## 2017-01-27 LAB — I-STAT CG4 LACTIC ACID, ED
LACTIC ACID, VENOUS: 3.18 mmol/L — AB (ref 0.5–1.9)
Lactic Acid, Venous: 5.49 mmol/L (ref 0.5–1.9)

## 2017-01-27 LAB — ABO/RH: ABO/RH(D): B POS

## 2017-01-27 LAB — CBG MONITORING, ED: GLUCOSE-CAPILLARY: 193 mg/dL — AB (ref 65–99)

## 2017-01-27 LAB — MRSA PCR SCREENING: MRSA by PCR: INVALID — AB

## 2017-01-27 LAB — HEPARIN LEVEL (UNFRACTIONATED): Heparin Unfractionated: 0.53 IU/mL (ref 0.30–0.70)

## 2017-01-27 LAB — STREP PNEUMONIAE URINARY ANTIGEN: Strep Pneumo Urinary Antigen: NEGATIVE

## 2017-01-27 MED ORDER — CHLORHEXIDINE GLUCONATE CLOTH 2 % EX PADS: 6.0000 | MEDICATED_PAD | Freq: Every day | CUTANEOUS | Status: DC

## 2017-01-27 MED ORDER — HYDRALAZINE HCL 20 MG/ML IJ SOLN
5.0000 mg | Freq: Four times a day (QID) | INTRAMUSCULAR | Status: DC | PRN
  Administered 2017-01-27 – 2017-01-29 (×5): 10 mg via INTRAVENOUS
  Filled 2017-01-27 (×5): qty 1

## 2017-01-27 MED ORDER — PIPERACILLIN-TAZOBACTAM 3.375 G IVPB
3.3750 g | Freq: Three times a day (TID) | INTRAVENOUS | Status: DC
  Administered 2017-01-27 – 2017-01-29 (×7): 3.375 g via INTRAVENOUS
  Filled 2017-01-27 (×8): qty 50

## 2017-01-27 MED ORDER — SODIUM BICARBONATE 8.4 % IV SOLN
INTRAVENOUS | Status: AC | PRN
  Administered 2017-01-27: 50 meq via INTRAVENOUS

## 2017-01-27 MED ORDER — SODIUM CHLORIDE 0.9 % IV BOLUS (SEPSIS)
2000.0000 mL | Freq: Once | INTRAVENOUS | Status: AC
  Administered 2017-01-27: 2000 mL via INTRAVENOUS

## 2017-01-27 MED ORDER — STERILE WATER FOR INJECTION IV SOLN
INTRAVENOUS | Status: DC
  Administered 2017-01-27 – 2017-01-29 (×7): via INTRAVENOUS
  Filled 2017-01-27 (×12): qty 850

## 2017-01-27 MED ORDER — FENTANYL CITRATE (PF) 100 MCG/2ML IJ SOLN
50.0000 ug | INTRAMUSCULAR | Status: DC | PRN
  Administered 2017-01-27 – 2017-01-29 (×2): 50 ug via INTRAVENOUS
  Filled 2017-01-27 (×2): qty 2

## 2017-01-27 MED ORDER — PANTOPRAZOLE SODIUM 40 MG IV SOLR
40.0000 mg | Freq: Every day | INTRAVENOUS | Status: DC
  Administered 2017-01-27 – 2017-01-28 (×2): 40 mg via INTRAVENOUS
  Filled 2017-01-27 (×2): qty 40

## 2017-01-27 MED ORDER — FENTANYL CITRATE (PF) 100 MCG/2ML IJ SOLN
50.0000 ug | INTRAMUSCULAR | Status: AC | PRN
  Administered 2017-01-27 (×3): 50 ug via INTRAVENOUS
  Filled 2017-01-27: qty 2

## 2017-01-27 MED ORDER — MIDAZOLAM HCL 2 MG/2ML IJ SOLN
1.0000 mg | INTRAMUSCULAR | Status: DC | PRN
Start: 2017-01-27 — End: 2017-01-30
  Administered 2017-01-27: 1 mg via INTRAVENOUS
  Filled 2017-01-27 (×2): qty 2

## 2017-01-27 MED ORDER — PIPERACILLIN-TAZOBACTAM 3.375 G IVPB 30 MIN
3.3750 g | Freq: Once | INTRAVENOUS | Status: AC
  Administered 2017-01-27: 3.375 g via INTRAVENOUS
  Filled 2017-01-27: qty 50

## 2017-01-27 MED ORDER — PERFLUTREN LIPID MICROSPHERE
1.0000 mL | INTRAVENOUS | Status: AC | PRN
  Administered 2017-01-27: 2 mL via INTRAVENOUS
  Filled 2017-01-27: qty 10

## 2017-01-27 MED ORDER — ORAL CARE MOUTH RINSE
15.0000 mL | OROMUCOSAL | Status: DC
  Administered 2017-01-27 – 2017-01-29 (×23): 15 mL via OROMUCOSAL

## 2017-01-27 MED ORDER — INSULIN ASPART 100 UNIT/ML ~~LOC~~ SOLN
0.0000 [IU] | SUBCUTANEOUS | Status: DC
  Administered 2017-01-27: 3 [IU] via SUBCUTANEOUS
  Administered 2017-01-27: 5 [IU] via SUBCUTANEOUS
  Administered 2017-01-27: 3 [IU] via SUBCUTANEOUS
  Administered 2017-01-27: 8 [IU] via SUBCUTANEOUS
  Administered 2017-01-27 – 2017-01-28 (×4): 3 [IU] via SUBCUTANEOUS
  Administered 2017-01-28 (×2): 2 [IU] via SUBCUTANEOUS
  Administered 2017-01-29: 3 [IU] via SUBCUTANEOUS
  Administered 2017-01-29: 2 [IU] via SUBCUTANEOUS
  Administered 2017-01-29: 6 [IU] via SUBCUTANEOUS
  Administered 2017-01-29: 3 [IU] via SUBCUTANEOUS

## 2017-01-27 MED ORDER — FUROSEMIDE 10 MG/ML IJ SOLN
80.0000 mg | Freq: Once | INTRAMUSCULAR | Status: AC
  Administered 2017-01-27: 80 mg via INTRAVENOUS
  Filled 2017-01-27: qty 8

## 2017-01-27 MED ORDER — HEPARIN (PORCINE) IN NACL 100-0.45 UNIT/ML-% IJ SOLN
1500.0000 [IU]/h | INTRAMUSCULAR | Status: DC
  Administered 2017-01-27 (×2): 1700 [IU]/h via INTRAVENOUS
  Administered 2017-01-28 – 2017-01-29 (×2): 1400 [IU]/h via INTRAVENOUS
  Filled 2017-01-27 (×4): qty 250

## 2017-01-27 MED ORDER — CHLORHEXIDINE GLUCONATE CLOTH 2 % EX PADS
6.0000 | MEDICATED_PAD | Freq: Every day | CUTANEOUS | Status: DC
  Administered 2017-01-28 – 2017-01-29 (×2): 6 via TOPICAL

## 2017-01-27 MED ORDER — SODIUM CHLORIDE 0.9% FLUSH: 10.0000 mL | Freq: Two times a day (BID) | INTRAVENOUS | Status: DC

## 2017-01-27 MED ORDER — CHLORHEXIDINE GLUCONATE 0.12% ORAL RINSE (MEDLINE KIT)
15.0000 mL | Freq: Two times a day (BID) | OROMUCOSAL | Status: DC
  Administered 2017-01-27: 15 mL via OROMUCOSAL

## 2017-01-27 MED ORDER — EPINEPHRINE PF 1 MG/10ML IJ SOSY
PREFILLED_SYRINGE | INTRAMUSCULAR | Status: AC | PRN
  Administered 2017-01-27: 1 via INTRAVENOUS

## 2017-01-27 MED ORDER — CHLORHEXIDINE GLUCONATE 0.12% ORAL RINSE (MEDLINE KIT)
15.0000 mL | Freq: Two times a day (BID) | OROMUCOSAL | Status: DC
  Administered 2017-01-27 – 2017-01-29 (×4): 15 mL via OROMUCOSAL

## 2017-01-27 MED ORDER — SODIUM CHLORIDE 0.9 % IV SOLN
INTRAVENOUS | Status: DC
  Administered 2017-01-27: 06:00:00 via INTRAVENOUS

## 2017-01-27 MED ORDER — SODIUM CHLORIDE 0.9 % IV BOLUS (SEPSIS): 1000.0000 mL | Freq: Once | INTRAVENOUS | Status: DC

## 2017-01-27 MED ORDER — PRO-STAT SUGAR FREE PO LIQD
60.0000 mL | Freq: Three times a day (TID) | ORAL | Status: DC
  Administered 2017-01-27 – 2017-01-29 (×6): 60 mL
  Filled 2017-01-27 (×6): qty 60

## 2017-01-27 MED ORDER — MIDAZOLAM HCL 2 MG/2ML IJ SOLN
1.0000 mg | INTRAMUSCULAR | Status: AC | PRN
  Administered 2017-01-27 – 2017-01-29 (×3): 1 mg via INTRAVENOUS
  Filled 2017-01-27 (×2): qty 2

## 2017-01-27 MED ORDER — VITAL HIGH PROTEIN PO LIQD
1000.0000 mL | ORAL | Status: DC
  Administered 2017-01-27: 1000 mL
  Administered 2017-01-28 (×10): 65 mL
  Administered 2017-01-29: 1000 mL

## 2017-01-27 MED ORDER — ASPIRIN 300 MG RE SUPP
300.0000 mg | RECTAL | Status: AC
  Administered 2017-01-27: 300 mg via RECTAL
  Filled 2017-01-27: qty 1

## 2017-01-27 MED ORDER — HEPARIN BOLUS VIA INFUSION
5000.0000 [IU] | Freq: Once | INTRAVENOUS | Status: AC
  Administered 2017-01-27: 5000 [IU] via INTRAVENOUS
  Filled 2017-01-27: qty 5000

## 2017-01-27 MED ORDER — HEPARIN SODIUM (PORCINE) 5000 UNIT/ML IJ SOLN: 5000.0000 [IU] | Freq: Three times a day (TID) | INTRAMUSCULAR | Status: DC

## 2017-01-27 MED ORDER — VANCOMYCIN HCL 10 G IV SOLR
1500.0000 mg | Freq: Once | INTRAVENOUS | Status: AC
  Administered 2017-01-27: 1500 mg via INTRAVENOUS
  Filled 2017-01-27: qty 1500

## 2017-01-27 MED ORDER — ORAL CARE MOUTH RINSE: 15.0000 mL | OROMUCOSAL | Status: DC

## 2017-01-27 MED ORDER — SODIUM CHLORIDE 0.9% FLUSH: 10.0000 mL | INTRAVENOUS | Status: DC | PRN

## 2017-01-27 MED ORDER — SODIUM CHLORIDE 0.9 % IV BOLUS (SEPSIS)
1000.0000 mL | Freq: Once | INTRAVENOUS | Status: AC
  Administered 2017-01-27: 1000 mL via INTRAVENOUS

## 2017-01-27 MED ORDER — VANCOMYCIN HCL 10 G IV SOLR
1500.0000 mg | INTRAVENOUS | Status: DC
  Administered 2017-01-28: 1500 mg via INTRAVENOUS
  Filled 2017-01-27 (×2): qty 1500

## 2017-01-27 MED ORDER — NOREPINEPHRINE BITARTRATE 1 MG/ML IV SOLN
0.0000 ug/min | INTRAVENOUS | Status: DC
  Filled 2017-01-27: qty 4

## 2017-01-27 MED ORDER — SODIUM CHLORIDE 0.9% FLUSH
10.0000 mL | Freq: Two times a day (BID) | INTRAVENOUS | Status: DC
  Administered 2017-01-27 – 2017-01-28 (×2): 10 mL

## 2017-01-27 MED ORDER — INSULIN ASPART 100 UNIT/ML ~~LOC~~ SOLN: 2.0000 [IU] | SUBCUTANEOUS | Status: DC

## 2017-01-27 MED ORDER — VANCOMYCIN HCL IN DEXTROSE 1-5 GM/200ML-% IV SOLN
1000.0000 mg | Freq: Once | INTRAVENOUS | Status: AC
  Administered 2017-01-27: 1000 mg via INTRAVENOUS
  Filled 2017-01-27: qty 200

## 2017-01-27 NOTE — Progress Notes (Signed)
CRITICAL VALUE ALERT  Critical Value:  K 6.4 (slight hemolysis) noted  Date & Time Notied:  01/22/2017 0540  Provider Notified: Louretta Parma NP & Dr. Phylliss Bob  Orders Received/Actions taken: redrawn and run on istat, K 5.4

## 2017-01-27 NOTE — Code Documentation (Signed)
Pulse check with ROSC.  

## 2017-01-27 NOTE — ED Notes (Signed)
2nd set blood culture collected by edp Ward.

## 2017-01-27 NOTE — Progress Notes (Signed)
CRITICAL VALUE ALERT  Critical Value:  PTT 194  Date & Time Notied:  02/04/2017 2322  Provider Notified: none, did discuss with pharmacy, pt on Hep gtt, Hep level and INR as expected, will have heparin level in am  Orders Received/Actions taken: none at this time

## 2017-01-27 NOTE — Progress Notes (Signed)
ANTICOAGULATION CONSULT NOTE - Initial Consult  Pharmacy Consult for heparin Indication: history of VTE  No Known Allergies  Patient Measurements: Height: 6\' 3"  (190.5 cm) Weight: (!) 396 lb 6.2 oz (179.8 kg) IBW/kg (Calculated) : 84.5 Heparin Dosing Weight: 120 kg  Vital Signs: Temp: 97.9 F (36.6 C) (01/12 1500) Temp Source: Core (Comment) (01/12 1300) BP: 154/78 (01/12 1500) Pulse Rate: 73 (01/12 1500)  Labs: Recent Labs    02/15/2017 0248 01/25/2017 0321 01/26/2017 0422 02/03/2017 0559 01/26/2017 1054 01/31/2017 1159  HGB 10.4* 11.2*  --  10.9*  --   --   HCT 33.6* 33.0*  --  32.0*  --   --   PLT 169  --   --   --   --   --   APTT 28  --   --   --   --   --   LABPROT  --   --  15.1  --   --   --   INR  --   --  1.20  --   --   --   HEPARINUNFRC  --   --   --   --   --  0.53  CREATININE 4.45* 4.30* 4.48*  --   --   --   TROPONINI <0.03  --  <0.03  --  0.06*  --     Estimated Creatinine Clearance: 26.6 mL/min (A) (by C-G formula based on SCr of 4.48 mg/dL (H)).  Assessment: 72 yo male on warfarin pta for history of PE. Admitted with INR 1.2    heparin gtt as bridge  Cbc stable  Initial hep lvl within goal range  Goal of Therapy:  Heparin level 0.3-0.7 units/ml Monitor platelets by anticoagulation protocol: Yes   Plan:  Continue heparin gtt 1700 units/hr  Daily HL, CBC  Levester Fresh, PharmD, BCPS, BCCCP Clinical Pharmacist Clinical phone for 02/09/2017 from 7a-3:30p: [phone] If after 3:30p, please call main pharmacy at: [phone] 01/24/2017 3:41 PM

## 2017-01-27 NOTE — Progress Notes (Signed)
ANTICOAGULATION CONSULT NOTE - Initial Consult  Pharmacy Consult for heparin Indication: history of VTE  No Known Allergies  Patient Measurements: Height: 6\' 3"  (190.5 cm) Weight: (!) 341 lb (154.7 kg) IBW/kg (Calculated) : 84.5 Heparin Dosing Weight: 120 kg  Vital Signs: Temp: 93.6 F (34.2 C) (01/12 0550) Temp Source: Bladder (01/12 0550) BP: 167/87 (01/12 0545) Pulse Rate: 29 (01/12 0445)  Labs: Recent Labs    02/15/2017 0248 01/22/2017 0321 02/05/2017 0422  HGB 10.4* 11.2*  --   HCT 33.6* 33.0*  --   PLT 169  --   --   APTT 28  --   --   LABPROT  --   --  15.1  INR  --   --  1.20  CREATININE 4.45* 4.30* 4.48*  TROPONINI <0.03  --  <0.03    Estimated Creatinine Clearance: 24.4 mL/min (A) (by C-G formula based on SCr of 4.48 mg/dL (H)).   Medical History: Past Medical History:  Diagnosis Date  . ALCOHOL ABUSE, IN REMISSION, HX OF 08/26/2008  . ANEMIA 08/31/2008  . ANXIETY 07/23/2006  . ASTHMA 07/23/2006  . 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 CARCINOMA, PROSTATE 09/29/2009  . CHF (congestive heart failure) ([place])    With a preserved EF  . 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 DM 05/23/2007  . 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 DM retinopathy ([place])   . ED (erectile dysfunction)   . HYPERCHOLESTEROLEMIA 05/23/2007  . HYPERTENSION 07/23/2006  . Morbid obesity ([place])   . OSTEOARTHRITIS 07/23/2006  . Palpitations 02/10/2008  . Pulmonary embolism ([place]) 03/16/2010  . Shortness of breath   . Sleep apnea   . TOBACCO ABUSE, HX OF 08/26/2008    Assessment: 72 yo male on warfarin pta for history of PE. Admitted with INR 1.2  To start heparin gtt as bridge hgb 11.2, plts wnl  Goal of Therapy:  Heparin level 0.3-0.7 units/ml Monitor platelets by anticoagulation protocol: Yes   Plan:  -Heparin bolus 4000 units x1 then 1700 units/hr  -Daily HL, CBC -Check first level this  afternoon   [name] 01/17/2017,6:02 AM

## 2017-01-27 NOTE — Progress Notes (Signed)
PCCM Interval Note   Patient family came back and decided to proceed with DNR. EMR updated and bedside nurse notified.   Hayden Pedro, AGACNP-BC [facility]  Pgr: [phone]  PCCM Pgr: [phone]

## 2017-01-27 NOTE — Procedures (Signed)
Central Venous Catheter Insertion Procedure Note Travis Chapman [id number] [birth year]  Procedure: Insertion of Central Venous Catheter Indications: Assessment of intravascular volume, Drug and/or fluid administration and Frequent blood sampling  Procedure Details Consent: Unable to obtain consent because of emergent medical necessity. Time Out: Verified patient identification, verified procedure, site/side was marked, verified correct patient position, special equipment/implants available, medications/allergies/relevent history reviewed, required imaging and test results available.  Performed  Maximum sterile technique was used including antiseptics, cap, gloves, gown, hand hygiene, mask and sheet. Skin prep: Chlorhexidine; local anesthetic administered A antimicrobial bonded/coated triple lumen catheter was placed in the right femoral vein due to emergent situation using the Seldinger technique.  Evaluation Blood flow good Complications: No apparent complications Patient did tolerate procedure well. Chest X-ray ordered to verify placement.    Hayden Pedro, AGACNP-BC [facility]  Pgr: [phone]  PCCM Pgr: [phone]

## 2017-01-27 NOTE — Code Documentation (Signed)
Cool protocol activated by Dr. Leonides Schanz.

## 2017-01-27 NOTE — Progress Notes (Signed)
Initial Nutrition Assessment  DOCUMENTATION CODES:   Morbid obesity  INTERVENTION:   -Initiate TF via OGT with Vital High Protein at goal rate of 65 ml/h (1560 ml per day) and Prostat 60 ml TID to provide 2160 kcals, 226 gm protein, 1304 ml free water daily.  NUTRITION DIAGNOSIS:   Inadequate oral intake related to inability to eat as evidenced by NPO status.  GOAL:   Provide needs based on ASPEN/SCCM guidelines  MONITOR:   Vent status, Labs, Weight trends, Skin, I & O's  REASON FOR ASSESSMENT:   Ventilator, Consult Enteral/tube feeding initiation and management  ASSESSMENT:   72 year old male with PMH of ETOH/Tobacco Abuse, HTN, HLD, DM, OHS s/p Tracheostomy, Vent Dependent at HS, H/O PE, Diastolic HF, CKD s/p cardiac arrest with tracheostomy dislodged.   Patient is currently on ventilator support via trach. OGT in place.  MV: 16.2 L/min Temp (24hrs), Avg:96 F (35.6 C), Min:91.6 F (33.1 C), Max:97.9 F (36.6 C)  Case discussed with RN, who confirms that pt family desires full code and plan to start TF today. RN reports pt is normothermic.   Pt +4.5 L within the past 24 hours.   Labs reviewed: CBGS: 237 (inpatient orders for glycemic control are 0-15 units insulin aspart every 4 hours).   NUTRITION - FOCUSED PHYSICAL EXAM:    Most Recent Value  Orbital Region  No depletion  Upper Arm Region  No depletion  Thoracic and Lumbar Region  No depletion  Buccal Region  No depletion  Temple Region  No depletion  Clavicle Bone Region  No depletion  Clavicle and Acromion Bone Region  No depletion  Scapular Bone Region  No depletion  Dorsal Hand  No depletion  Patellar Region  No depletion  Anterior Thigh Region  No depletion  Posterior Calf Region  No depletion  Edema (RD Assessment)  Moderate  Hair  Reviewed  Eyes  Reviewed  Mouth  Reviewed  Skin  Reviewed  Nails  Reviewed       Diet Order:  No diet orders on file  EDUCATION NEEDS:   Not appropriate  for education at this time  Skin:  Skin Assessment: Reviewed RN Assessment  Last BM:  via rectal tube  Height:   Ht Readings from Last 1 Encounters:  01/20/2017 6\' 3"  (1.905 m)    Weight:   Wt Readings from Last 1 Encounters:  01/21/2017 (!) 396 lb 6.2 oz (179.8 kg)    Ideal Body Weight:  89.1 kg  BMI:  Body mass index is 49.54 kg/m.  Estimated Nutritional Needs:   Kcal:  0165-5374  Protein:  > 223 grams  Fluid:  > 2.0 L    Marcha Licklider A. Jimmye Norman, RD, LDN, CDE Pager: [phone] After hours Pager: [phone]

## 2017-01-27 NOTE — ED Triage Notes (Signed)
Pt brought to ED by [facility] from home after having a cardiac arrest at home CPR started by [facility] at 1:44 with ROSC at 2:03 total of 3 epis, 300 of Amiodarone and one shock given by EMS prior to [place], pt is thrac pt on a ventilator at home when EMS arrive to his house ventilator was off and looks like pt was trying to suction him self on the way to ED pt loose pulses again and CPR started 0322 with ROSC at 0325.  BP 110/72 Cabno 61 HR 66 CBG 229  Pt loose pulses again on ED arrival at 0238 CPR started on ED. See CODE documentation.

## 2017-01-27 NOTE — Progress Notes (Signed)
Pharmacy Antibiotic Note Travis Chapman is a 72 y.o. male admitted on 01/24/2017 after cardiac arrest with CPR and eventual ROSC. CXR showing opacity on L thorax. Planning to start empiric abx. SCr 1.5 up to 4.4 after CPR, eCrCl 20-30 ml/min. LA 5.4 > 1.9. Patient received SUBoptimal vancomycin load in ED - will give an extra 1.5 g now to equal appropriate load.   Plan: -Vancomycin 2500 mg IV x1 then 1750 mg IV q24h -Zosyn 2.25 g IV q8h -Monitor renal fx, cultures, VR as needed   Height: 6\' 3"  (190.5 cm) Weight: (!) 341 lb (154.7 kg) IBW/kg (Calculated) : 84.5  Temp (24hrs), Avg:96.7 F (35.9 C), Min:96.7 F (35.9 C), Max:96.7 F (35.9 C)  Recent Labs  Lab 01/24/2017 0248 01/28/2017 0321 01/26/2017 0422 02/14/2017 0423 02/07/2017 0430  WBC 6.1  --   --   --   --   CREATININE 4.45* 4.30* 4.48*  --   --   LATICACIDVEN  --  5.49*  --  3.0* 3.18*    Estimated Creatinine Clearance: 24.4 mL/min (A) (by C-G formula based on SCr of 4.48 mg/dL (H)).     Antimicrobials this admission: 1/12 vancomycin > 1/12 zosyn >  Dose adjustments this admission: N/A  Microbiology results: 1/12 blood cx: 1/12 resp cx:   [name] 01/18/2017 5:53 AM

## 2017-01-27 NOTE — Progress Notes (Signed)
Chronic trach patient arrived to the ED with 6.0 ett inserted into stoma.  Patient's trach size and type is unknown.  Attempted to place a #8 shiley, unsuccessfully.  #6 shiley cuffed placed on second attempt.  No bleeding, positive color change with ez-cap, bbs equal, cxr pending.  Dr. Leonides Schanz at bedside throughout, sats and HR stable throughout.

## 2017-01-27 NOTE — ED Provider Notes (Signed)
TIME SEEN: 3:17 AM  CHIEF COMPLAINT: Cardiac arrest  HPI: Patient is a 72 year old male with history of morbid obesity, CHF, obesity hypoventilation syndrome who has a tracheostomy in place and is vent dependent at night, diabetes, hypertension who presents to the emergency department as a cardiac arrest.  Per EMS patient activated his life alert which called them to the house.  States that they were there within 5 minutes of being called.  State upon arrival patient was pulseless, apneic in asystole.  He was sitting upright on the couch and it appears as if he had pulled his trach out and was trying to suction himself.  EMS states that this was around 1:44 AM.  Reports that they started CPR and at approximately 19 minutes of CPR before he had return of spontaneous circulation.  He did have one episode of ventricular fibrillation and was given 300 mg of amiodarone and was defibrillated once.  Patient then had another brief episode of cardiac arrest in the ambulance for 2 minutes.  With EMS he received approximately 3 epinephrines.  He had a blood glucose of 229.  On arrival patient has a sinus tachycardia.  He is hypertensive on manual blood pressure.  Has a normal blood glucose.  Patient had a brief episode of cardiac arrest in the emergency department where it appears he went into PEA.  Was given 1 epinephrine and 1 amp of bicarbonate and had return of spontaneous circulation again.  ROS: Level 5 caveat for cardiac arrest   PAST MEDICAL HISTORY/PAST SURGICAL HISTORY:  Past Medical History:  Diagnosis Date  . ALCOHOL ABUSE, IN REMISSION, HX OF 08/26/2008  . ANEMIA 08/31/2008  . ANXIETY 07/23/2006  . ASTHMA 07/23/2006  . 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 CARCINOMA, PROSTATE 09/29/2009  . CHF (congestive heart failure) ([place])    With a preserved EF  . 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 DM 05/23/2007  . 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 DM retinopathy ([place])   . ED (erectile dysfunction)   . HYPERCHOLESTEROLEMIA 05/23/2007  . HYPERTENSION 07/23/2006  . Morbid obesity ([place])   . OSTEOARTHRITIS 07/23/2006  . Palpitations 02/10/2008  . Pulmonary embolism ([place]) 03/16/2010  . Shortness of breath   . Sleep apnea   . TOBACCO ABUSE, HX OF 08/26/2008    MEDICATIONS:  Prior to Admission medications   Medication Sig Start Date End Date Taking? Authorizing Provider  acetaminophen (TYLENOL) 650 MG CR tablet Take 1,300 mg by mouth See admin instructions. Takes two tablets every night at bedtime. Can take daily as needed too.    [provider]  ALPRAZolam Duanne Moron) 2 MG tablet Take 1 tablet (2 mg total) by mouth at bedtime as needed for anxiety or sleep. 12/17/13   Reyne Dumas, MD  amLODipine (NORVASC) 10 MG tablet Take 1 tablet (10 mg total) by mouth daily. 05/21/14   Renato Shin, MD  carvedilol (COREG) 6.25 MG tablet Take 1 tablet (6.25 mg total) by mouth 2 (two) times daily with a meal. 05/21/14   Renato Shin, MD  citalopram (CELEXA) 20 MG tablet Take 1 tablet (20 mg total) by mouth every evening. 05/21/14   Renato Shin, MD  cloNIDine (CATAPRES) 0.1 MG tablet Take 1 tablet (0.1 mg total) by mouth 2 (two) times daily. 05/21/14   Renato Shin, MD  docusate sodium 100 MG CAPS Take 100  mg by mouth 2 (two) times daily. 09/29/13   Samella Parr, NP  furosemide (LASIX) 20 MG tablet Take 3 tablets (60 mg total) by mouth daily. 05/21/14   Renato Shin, MD  insulin aspart (NOVOLOG) 100 UNIT/ML injection CBG 70 - 120: 0 units, CBG 121 - 150:2 units, CBG 151 - 200:3 units, CBG 201 - 250: 5 units, CBG 251 - 300:8 units, CBG 301 - 350:11 units, CBG 351 - 400: 15 units CBG > 400 call MD and obtain STAT lab verification         05/21/14   Renato Shin, MD  insulin glargine (LANTUS) 100 UNIT/ML injection Inject 0.28 mLs (28 Units total) into the skin every morning. 05/21/14   Renato Shin, MD  losartan  (COZAAR) 100 MG tablet Take 0.5 tablets (50 mg total) by mouth daily. 05/21/14   Renato Shin, MD  metoCLOPramide (REGLAN) 5 MG tablet Take 1 tablet (5 mg total) by mouth 3 (three) times daily. 05/21/14   Renato Shin, MD  Multiple Vitamin (MULTIVITAMIN) tablet Take 1 tablet by mouth daily.     [provider]  Oxycodone HCl 10 MG TABS Take 1 tablet (10 mg total) by mouth every 4 (four) hours as needed. 05/21/14   Renato Shin, MD  polyethylene glycol [facility] / Floria Raveling) packet Take 17 g by mouth daily. 05/21/14   Renato Shin, MD  potassium chloride SA (KLOR-CON M20) 20 MEQ tablet Take 2 tablets (40 mEq total) by mouth daily. 05/21/14   Renato Shin, MD  simvastatin (ZOCOR) 20 MG tablet Take 1 tablet (20 mg total) by mouth daily at 6 PM. 05/21/14   Renato Shin, MD  warfarin (COUMADIN) 10 MG tablet Pt takes 10 mg mon, wed, fri and 5mg  all other days 05/21/14   Renato Shin, MD    ALLERGIES:  No Known Allergies  SOCIAL HISTORY:  Social History   Tobacco Use  . Smoking status: Former Smoker    Packs/day: 0.50    Years: 15.00    Pack years: 7.50    Types: Cigarettes    Last attempt to quit: 01/16/2005    Years since quitting: 12.0  Substance Use Topics  . Alcohol use: No    FAMILY HISTORY: Family History  Problem Relation Age of Onset  . Diabetes Mellitus II Father   . CAD Father   . Cancer Other     EXAM: BP 101/65   Pulse (!) 151   Temp (!) 96.7 F (35.9 C) (Temporal)   Resp 19   Ht 6\' 3"  (1.905 m)   Wt (!) 154.7 kg (341 lb)   SpO2 99%   BMI 42.62 kg/m  CONSTITUTIONAL: GCS 3, morbidly obese HEAD: Normocephalic EYES: Conjunctivae clear, patient's left pupil is 5 mm and sluggishly reactive, he is enucleated on the right side ENT: normal nose; moist mucous membranes NECK: Supple, tracheostomy in the center of his neck with small amount of clear secretions with ET tube in place CARD: Regular and tachycardic; S1 and S2 appreciated; no murmurs, no clicks, no rubs, no  gallops RESP: Spontaneous respirations, patient receiving ventilation with endotracheal tube in place through a tracheostomy, very diminished breath sounds bilaterally, no rhonchi or rales, no wheezing ABD/GI: Abdomen distended without tympany or fluid wave EXT: Chronic appearing peripheral edema and signs of venous stasis dermatitis to both lower extremities, extremities are cool to palpation    SKIN: No rash, cool to palpation NEURO: GCS 3  MEDICAL DECISION MAKING: Patient here after cardiac arrest.  He is right at the 1 hour mark of being down.  Will start cooling him.  He did have a brief episode of cardiac arrest here.  I think all of this is triggered from respiratory failure.  Doing much better after endotracheal tube replaced with 6 Shiley.  Patient on ventilator.  Initial chest x-ray showed a completely whited out left lung which may have been due to right mainstem bronchus intubation.  Once this endotracheal tube was replaced with a 6 Shiley and repeat chest x-ray obtained it appears aeration has improved but there are still possible bilateral infiltrates and effusion.  We are covering him broadly with antibiotics for sepsis.  He is receiving IV fluids.  Will obtain labs, urine, cultures.  Will discuss with critical care.  ED PROGRESS:   3:15 AM  D/w Dr. Gladys Damme with CCM.  They will see patient for admission.  3:35 AM  D/w patient's family.  They understand that patient was a DNR/DNI and a 2015.  At this time however they would like to keep him a full code.  They understand that he likely has poor prognosis given he could have been hypoxic for significant period of time.  He currently has a GCS of 3 and is not on any sedation crackle care at bedside.  Labs show lactic acidosis.  ABG shows pH of 7.1 with a bicarb of 16.5.  Patient has elevated creatinine at 4.3.  Is receiving IV fluids.  Troponin negative.   I reviewed all nursing notes, vitals, pertinent previous records, EKGs, lab and  urine results, imaging (as available).    Cardiopulmonary Resuscitation (CPR) Procedure Note Directed/Performed by: Pryor Curia I personally directed ancillary staff and/or performed CPR in an effort to regain return of spontaneous circulation and to maintain cardiac, neuro and systemic perfusion.     CRITICAL CARE Performed by: Pryor Curia   Total critical care time: 45 minutes  Critical care time was exclusive of separately billable procedures and treating other patients.  Critical care was necessary to treat or prevent imminent or life-threatening deterioration.  Critical care was time spent personally by me on the following activities: development of treatment plan with patient and/or surrogate as well as nursing, discussions with consultants, evaluation of patient's response to treatment, examination of patient, obtaining history from patient or surrogate, ordering and performing treatments and interventions, ordering and review of laboratory studies, ordering and review of radiographic studies, pulse oximetry and re-evaluation of patient's condition.      EKG Interpretation  Date/Time:  Saturday January 27 2017 02:44:23 EST Ventricular Rate:  97 PR Interval:    QRS Duration: 169 QT Interval:  396 QTC Calculation: 504 R Axis:   -81 Text Interpretation:  Age not entered, assumed to be  72 years old for purpose of ECG interpretation Sinus rhythm Short PR interval Right bundle branch block Confirmed by Pryor Curia [phone]) on 01/29/2017 3:41:31 AM          EKG Interpretation  Date/Time:  Saturday January 27 2017 02:58:05 EST Ventricular Rate:  52 PR Interval:    QRS Duration: 169 QT Interval:  479 QTC Calculation: 446 R Axis:   -74 Text Interpretation:  Atrial fibrillation IVCD, consider atypical RBBB Confirmed by Avory Mimbs, Cyril Mourning [phone]) on 01/26/2017 3:43:12 AM         Larita Deremer, Delice Bison, DO 01/18/2017 [id number]

## 2017-01-27 NOTE — Progress Notes (Signed)
PULMONARY / CRITICAL CARE MEDICINE   Name: Travis Chapman MRN: [id number] DOB: Feb 04, 1945    ADMISSION DATE:  02/09/2017 CONSULTATION DATE:  01/22/2017  REFERRING MD:  Dr. Leonides Schanz   CHIEF COMPLAINT:  Cardiac Arrest   HISTORY OF PRESENT ILLNESS:   72 year old male with PMH of ETOH/Tobacco Abuse, HTN, HLD, DM, OHS s/p Tracheostomy, Vent Dependent at HS, H/O PE, Diastolic HF, CKD   Presents to ED on 1/12 s/p Cardiac Arrest. Reportedly patient activated life alert. Upon arrival EMS reports that patient was sitting on couch with tracheostomy out, found to be asystole. CPR for 19 minutes before ROSC. During that time he had one episode of V.Fib, Defib x 1 and given 300 mg amiodarone. In route additional 3 minutes down. Upon arrival to ED patient had a brief PEA arrest for 3 minutes. Patient presented with 6.0 ETT inserted in stoma, exchanged for cuffed Shiley, unsuccessful placement of #8, #6 placed.   In ED patient remains unresponsive with myoclonic activity. Family confirms Full Code Status.    SUBJECTIVE:  Cardiac arrest overnight due to dislodgement of tracheostomy, no further events overnight  VITAL SIGNS: BP 136/72   Pulse 77   Temp (!) 96.3 F (35.7 C)   Resp (!) 28   Ht 6\' 3"  (1.905 m)   Wt (!) 179.8 kg (396 lb 6.2 oz)   SpO2 100%   BMI 49.54 kg/m   HEMODYNAMICS: CVP:  [18 mmHg-19 mmHg] 19 mmHg  VENTILATOR SETTINGS: Vent Mode: PRVC FiO2 (%):  [50 %-100 %] 50 % Set Rate:  [20 bmp] 20 bmp Vt Set:  [600 mL] 600 mL PEEP:  [5 cmH20] 5 cmH20 Plateau Pressure:  [21 cmH20] 21 cmH20  INTAKE / OUTPUT: I/O last 3 completed shifts: In: 4520.8 [I.V.:2270.8; IV Piggyback:2250] Out: -   PHYSICAL EXAMINATION: General:  Morbidly Obese Male, on vent  Neuro:  Unresponsive, not following commands HEENT: Previous removal of right eye, left pupil sluggish, does not withdrawal, +cough/gag  Cardiovascular:  RRR, Nl S1/S2 and -M/R/G. Lungs:  Coarse and distant BS Abdomen:  Soft, NT, ND and  +BS Musculoskeletal:  +2 BLE edema  Skin:  Dry, Intact   LABS:  BMET Recent Labs  Lab 02/05/2017 0248 01/31/2017 0321 02/01/2017 0422 02/11/2017 0559  NA 135 140 136 141  K 5.1 5.2* 6.4* 5.4*  CL 107 109 108  --   CO2 14*  --  15*  --   BUN 50* 50* 54*  --   CREATININE 4.45* 4.30* 4.48*  --   GLUCOSE 257* 250* 271* 299*   Electrolytes Recent Labs  Lab 02/02/2017 0248 01/20/2017 0307 01/23/2017 0422  CALCIUM 8.1*  --  7.7*  MG  --  2.2  --    CBC Recent Labs  Lab 02/02/2017 0248 02/06/2017 0321 02/07/2017 0559  WBC 6.1  --   --   HGB 10.4* 11.2* 10.9*  HCT 33.6* 33.0* 32.0*  PLT 169  --   --    Coag's Recent Labs  Lab 01/18/2017 0248 01/23/2017 0422  APTT 28  --   INR  --  1.20   Sepsis Markers Recent Labs  Lab 01/16/2017 0422 01/24/2017 0423 01/19/2017 0430 02/11/2017 0746  LATICACIDVEN  --  3.0* 3.18* 2.2*  PROCALCITON <0.10  --   --   --    ABG Recent Labs  Lab 01/22/2017 0320 02/02/2017 0605  PHART 7.113* 7.257*  PCO2ART 51.8* 34.9  PO2ART 208.0* 264*    Liver Enzymes Recent Labs  Lab 02/08/2017 0248  AST 47*  ALT 37  ALKPHOS 88  BILITOT 0.6  ALBUMIN 3.2*    Cardiac Enzymes Recent Labs  Lab 02/07/2017 0248 01/19/2017 0422  TROPONINI <0.03 <0.03    Glucose Recent Labs  Lab 02/02/2017 0241  GLUCAP 193*    Imaging Ct Head Wo Contrast  Result Date: 01/20/2017 CLINICAL DATA:  72 year old male with altered mental status. EXAM: CT HEAD WITHOUT CONTRAST TECHNIQUE: Contiguous axial images were obtained from the base of the skull through the vertex without intravenous contrast. COMPARISON:  Head CT dated 12/02/2013 FINDINGS: Brain: There is mild age-related atrophy and chronic microvascular ischemic changes no acute intracranial hemorrhage. No mass effect or midline shift. No extra-axial fluid collection. Vascular: No hyperdense vessel or unexpected calcification. Skull: Normal. Negative for fracture or focal lesion. Sinuses/Orbits: Mild mucoperiosteal thickening of  paranasal sinuses. Probable old fracture of the right lamina papyracea as seen on the prior CT. Other: None IMPRESSION: 1. No acute intracranial pathology. 2. Mild age-related atrophy and chronic microvascular ischemic changes. Electronically Signed   By: Anner Crete M.D.   On: 02/07/2017 05:14   Ct Chest Wo Contrast  Result Date: 02/14/2017 CLINICAL DATA:  72 year old male with acute respiratory illness. Status post cardiac arrest and CPR. EXAM: CT CHEST WITHOUT CONTRAST TECHNIQUE: Multidetector CT imaging of the chest was performed following the standard protocol without IV contrast. COMPARISON:  Chest radiograph dated 01/21/2017 FINDINGS: Evaluation of this exam is limited in the absence of intravenous contrast. Evaluation is also limited due to streak artifact caused by patient's arms. Cardiovascular: Mild cardiomegaly. No pericardial effusion. Mild atherosclerotic calcification of the aortic arch. The thoracic aorta is grossly unremarkable on this noncontrast CT. Mild prominence of the central pulmonary arteries suggestive of underlying pulmonary hypertension. Mediastinum/Nodes: No large hilar or mediastinal adenopathy. Esophagus is grossly unremarkable. No mediastinal fluid collection. Lungs/Pleura: There are patchy consolidative changes of the lung apices, left greater right which may represent pneumonia versus areas of pulmonary contusion. Bibasilar streaky densities consistent with atelectasis or scarring. There is no pleural effusion or pneumothorax. The central airways are patent partially visualized tracheostomy with tip above the carina at the level of C7 vertebra. Upper Abdomen: There is moderate gaseous distention of the stomach. Partially visualized or large fat content posterior to the right lobe of the liver may represent lipomatosis or a lipoma. This is only partially visualized and not well evaluated. Musculoskeletal: Minimally displaced fractures of the anterior left third-fifth ribs as  well as anterior right fourth-sixth ribs. Multiple bilateral old rib fractures. Degenerative changes of the spine. There is fracture of the sternal body. IMPRESSION: 1. Patchy consolidative changes of the apical region may represent pneumonia or pulmonary contusion. 2. Nondisplaced fracture of the sternal body as well as minimally displaced fractures of the anterior ribs bilaterally, possibly related to CPR. Electronically Signed   By: Anner Crete M.D.   On: 01/25/2017 05:25   Dg Chest Port 1 View  Result Date: 01/16/2017 CLINICAL DATA:  Tracheostomy tube follow-up EXAM: PORTABLE CHEST 1 VIEW COMPARISON:  January 27, 2017 FINDINGS: The tracheostomy tube is in good position. The NG tube terminates below today's film. Continued cardiomegaly and mild pulmonary venous congestion. No other changes. IMPRESSION: Support apparatus as above. Electronically Signed   By: Dorise Bullion III M.D   On: 02/07/2017 09:00   Dg Chest Portable 1 View  Result Date: 01/28/2017 CLINICAL DATA:  ETT placement EXAM: PORTABLE CHEST 1 VIEW COMPARISON:  02/06/2017, 12/02/2013 FINDINGS: Tracheostomy tube  tip projects at the thoracic inlet. Improved aeration of the left thorax. Cardiomegaly with possible small left effusion. Left lower lobe atelectasis or pneumonia. Mild opacity in the right upper lobe better seen. Aortic atherosclerosis. Enlarged mediastinal contour, augmented by portable technique. IMPRESSION: 1. Tip of tracheostomy tube projects over thoracic inlet 2. Improved aeration of the left upper lobe 3. Moderate to marked cardiomegaly, suspect slight increase in size allowing for portable technique, could be due to multi chamber enlargement versus pericardial effusion. 4. Probable small left effusion. Atelectasis or infiltrates at the left lung base and right upper lobe. 5. Enlarged mediastinal contour, this is augmented by portable technique. Electronically Signed   By: Donavan Foil M.D.   On: 02/15/2017 03:27   Dg  Chest Portable 1 View  Result Date: 01/20/2017 CLINICAL DATA:  Post CPR, respiratory failure EXAM: PORTABLE CHEST 1 VIEW COMPARISON:  12/02/2013 radiograph FINDINGS: Suspected tracheal tube but the tip is poorly visible, possibly in the region of carina or right mainstem bronchus. Diffuse opacity of the left thorax. Right lung grossly clear. Obscured cardiomediastinal silhouette IMPRESSION: 1. Diffuse opacity of the left thorax may be secondary to large effusion and/or underlying diffuse consolidation. 2. Suspect presence of a tracheal tube but the tip is poorly visible, possibly projecting over the carina or proximal right mainstem bronchus. Electronically Signed   By: Donavan Foil M.D.   On: 02/04/2017 03:02   Dg Abd Portable 1v  Result Date: 02/06/2017 CLINICAL DATA:  Evaluate NG tube placement. EXAM: PORTABLE ABDOMEN - 1 VIEW COMPARISON:  None. FINDINGS: Multiple lines and wires are projected over the upper abdomen which limits evaluation. However, the side port of the NG tube appears to be in the distal stomach or proximal duodenum. IMPRESSION: Evaluation is limited due to overlapping lines and wires. However, the side port of the NG tube appears to be in the region of the distal stomach or proximal duodenum. Electronically Signed   By: Dorise Bullion III M.D   On: 01/20/2017 08:59     STUDIES:  CXR 1/12 > . Diffuse opacity of the left thorax may be secondary to large effusion and/or underlying diffuse consolidation. 2. Suspect presence of a tracheal tube but the tip is poorly visible, possibly projecting over the carina or proximal right mainstem bronchus. CXR 1/12 > Tip of tracheostomy tube projects over thoracic inlet 2. Improved aeration of the left upper lobe 3. Moderate to marked cardiomegaly, suspect slight increase in size allowing for portable technique, could be due to multi chamber enlargement versus pericardial effusion. 4. Probable small left effusion. Atelectasis or  infiltrates at the left lung base and right upper lobe. 5. Enlarged mediastinal contour, this is augmented by portable Technique. CT Head 1/12 > No acute intracranial pathology. Mild age-related atrophy and chronic microvascular ischemic Changes. CT Chest 1/12 > Patchy consolidative changes of the apical region may represent pneumonia or pulmonary contusion. 2. Nondisplaced fracture of the sternal body as well as minimally displaced fractures of the anterior ribs bilaterally, possibly related to CPR.   CULTURES: Blood 1/12 > Sputum 1/12 > U/A 1/12 > RVP/Flu 1/12 >    ANTIBIOTICS: Vancomycin 1/12 >  Zosyn 1/12 >   SIGNIFICANT EVENTS: 1/12 > Presents to ED   LINES/TUBES: ETT 1/12 >> RFemoral CVC 1/12 >> RAline 1/12 >>   DISCUSSION: 72 year old male presents s/p Cardiac Arrest after being found with tracheostomy dislodged.   ASSESSMENT / PLAN:  PULMONARY A: Acute Hypercarbic/Hypoxic Respiratory Failure, concern for mucus  plugging in setting of CAP vs possible PE > on coumadin INR 1.20 vs tracheostomy malfunction  CXR > Diffuse opacity of left thorax  H/O OHS s/p Tracheostomy, Vent Dependent at HS   P:   Continue full vent support Trend ABG/CXR VAB Bundle  Adjust vent for ABG Titrate O2 for sat of 8-92%  CARDIOVASCULAR A:  Asystole Cardiac Arrest, estimated 25 minutes down  Diastolic HF (F8BO, EF 17-51 2015)  H/O HTN, HLD, PE on Coumadin   P:  Cardiac Monitoring Levophed to maintain MAP>70 Trend Troponin  ECHO pending Heparin gtt  36 Hypothermia Protocol   RENAL A:   Anion Gap Metabolic Acidosis in setting of Lactic Acidosis  LA 5.49 > 3.18 >  Acute on CKD (Last Known Crt 1.59 in 2015)  Crt 4.45 >  P:   Trend BMP BID  Replace electrolytes as indicated  Will give 1L NS and 80 mg IV lasix x1 for hyperkalemia with a recheck  GASTROINTESTINAL A:   SUP Morbid Obesity  P:   NPO  PPI  TF per nutrition  HEMATOLOGIC A:   On Chronic Anticoagulation   P:  Trend CBC  Heparin gtt as above  INR is 1.2, heparin drip  INFECTIOUS A:   Suspected CAP  CXR with diffuse opacity of left thorax  Afebrile, WBC 6.1  P:   Trend PCT and LA Trend WBC and Fever Curve Follow Culture Data  RVP/Flu pending  Continue Vancomycin and Zosyn  Step P. Pending  ENDOCRINE A:   DM   P:   Trend Glucose  SSI  NEUROLOGIC A:   Metabolic Encephalopathy vs Anoxic Injury  P:   RASS goal: 0/-1 PRN Fentanyl/Versed EEG pending   FAMILY  - Updates: No family bedside to update  - Inter-disciplinary family meet or Palliative Care meeting due by: 02/04/2016  The patient is critically ill with multiple organ systems failure and requires high complexity decision making for assessment and support, frequent evaluation and titration of therapies, application of advanced monitoring technologies and extensive interpretation of multiple databases.   Critical Care Time devoted to patient care services described in this note is  45  Minutes. This time reflects time of care of this signee Dr Jennet Maduro. This critical care time does not reflect procedure time, or teaching time or supervisory time of PA/NP/Med student/Med Resident etc but could involve care discussion time.  Rush Farmer, M.D. [facility]. Pager: [phone]. After hours pager: [phone].

## 2017-01-27 NOTE — Progress Notes (Signed)
PCCM Interval Note   Spoke with family (Son, Daughter x 3, Grand-daughter, and Girlfriend) at length regarding current medical condition. States that Dr. Leonides Schanz spoke with them in ED regarding condition/Goals of Care. Family understands severity of condition and would like to speak to other family members further before making decisions.   Hayden Pedro, AGACNP-BC [place] [facility]  Pgr: [phone]  PCCM Pgr: [phone]

## 2017-01-27 NOTE — Procedures (Signed)
Arterial Catheter Insertion Procedure Note Travis Chapman [id number] [birth year]  Procedure: Insertion of Arterial Catheter  Indications: Blood pressure monitoring and Frequent blood sampling  Procedure Details Consent: Unable to obtain consent because of emergent medical necessity. Time Out: Verified patient identification, verified procedure, site/side was marked, verified correct patient position, special equipment/implants available, medications/allergies/relevent history reviewed, required imaging and test results available.  Performed  Maximum sterile technique was used including antiseptics, cap, gloves, gown, hand hygiene, mask and sheet. Skin prep: Chlorhexidine; local anesthetic administered 22 gauge catheter was inserted into right femoral artery using the Seldinger technique.  Evaluation Blood flow good; BP tracing good. Complications: No apparent complications.  Hayden Pedro, AGACNP-BC [facility]  Pgr: [phone]  PCCM Pgr: [phone]

## 2017-01-27 NOTE — ED Notes (Signed)
This RN got a phone call from family member upset that no one update them about pt's condition or about pt been transferred to the unit, family oriented that we move pt directly from CT to the room and this RN notified ED CN to call chaplain to notified them and take them to the unit, daughter started arguing on the phone that she doesn't need any chaplain to notified them, she need it that the ICU doctor talk to them, this RN explained to her that I don't have control of any ICU MD decision or update they can give, ED provider talked to them and discussed the POC with them and I really can't let the pt along and got to the consultation room to update family, that is the reason we use Chaplain as a resource to help getting family the right attention and information. Family got very upset and hang up on me.

## 2017-01-27 NOTE — Progress Notes (Signed)
Chaplain spoke with family in waiting room of [facility]. Pt. Is supported by a relatively large family system that includes his children. I will brief the oncoming Chaplain on what is happening as of time of this note and offer recommendation for Pastoral Care follow-up / check-in.

## 2017-01-27 NOTE — H&P (Signed)
PULMONARY / CRITICAL CARE MEDICINE   Name: Travis Chapman MRN: [id number] DOB: [birth year]    ADMISSION DATE:  01/26/2017 CONSULTATION DATE:  01/17/2017  REFERRING MD:  Dr. Leonides Schanz   CHIEF COMPLAINT:  Cardiac Arrest   HISTORY OF PRESENT ILLNESS:   72 year old male with PMH of ETOH/Tobacco Abuse, HTN, HLD, DM, OHS s/p Tracheostomy, Vent Dependent at HS, H/O PE, Diastolic HF, CKD   Presents to ED on 1/12 s/p Cardiac Arrest. Reportedly patient activated life alert. Upon arrival EMS reports that patient was sitting on couch with tracheostomy out, found to be asystole. CPR for 19 minutes before ROSC. During that time he had one episode of V.Fib, Defib x 1 and given 300 mg amiodarone. In route additional 3 minutes down. Upon arrival to ED patient had a brief PEA arrest for 3 minutes. Patient presented with 6.0 ETT inserted in stoma, exchanged for cuffed Shiley, unsuccessful placement of #8, #6 placed.   In ED patient remains unresponsive with myoclonic activity. Family confirms Full Code Status.    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  PAST SURGICAL HISTORY: He  has a past surgical history that includes Back surgery; Tracheostomy; and Colonoscopy.  No Known Allergies  No current facility-administered medications on file prior to encounter.    Current Outpatient  Medications on File Prior to Encounter  Medication Sig  . acetaminophen (TYLENOL) 650 MG CR tablet Take 1,300 mg by mouth See admin instructions. Takes two tablets every night at bedtime. Can take daily as needed too.  Marland Kitchen ALPRAZolam (XANAX) 2 MG tablet Take 1 tablet (2 mg total) by mouth at bedtime as needed for anxiety or sleep.  Marland Kitchen amLODipine (NORVASC) 10 MG tablet Take 1 tablet (10 mg total) by mouth daily.  . carvedilol (COREG) 6.25 MG tablet Take 1 tablet (6.25 mg total) by mouth 2 (two) times daily with a meal.  . citalopram (CELEXA) 20 MG tablet Take 1 tablet (20 mg total) by mouth every evening.  . cloNIDine (CATAPRES) 0.1 MG tablet Take 1 tablet (0.1 mg total) by mouth 2 (two) times daily.  Marland Kitchen docusate sodium 100 MG CAPS Take 100 mg by mouth 2 (two) times daily.  . furosemide (LASIX) 20 MG tablet Take 3 tablets (60 mg total) by mouth daily.  . insulin aspart (NOVOLOG) 100 UNIT/ML injection CBG 70 - 120: 0 units, CBG 121 - 150:2 units, CBG 151 - 200:3 units, CBG 201 - 250: 5 units, CBG 251 - 300:8 units, CBG 301 - 350:11 units, CBG 351 - 400: 15 units CBG > 400 call MD and obtain STAT lab verification          . insulin glargine (LANTUS) 100 UNIT/ML injection Inject 0.28 mLs (28 Units total) into the skin every morning.  Marland Kitchen losartan (COZAAR) 100 MG tablet Take 0.5 tablets (50 mg total) by mouth daily.  . metoCLOPramide (REGLAN) 5 MG tablet Take 1 tablet (5 mg total) by mouth 3 (three) times daily.  . Multiple Vitamin (MULTIVITAMIN) tablet Take 1 tablet by mouth  daily.   . Oxycodone HCl 10 MG TABS Take 1 tablet (10 mg total) by mouth every 4 (four) hours as needed.  . polyethylene glycol (MIRALAX / GLYCOLAX) packet Take 17 g by mouth daily.  . potassium chloride SA (KLOR-CON M20) 20 MEQ tablet Take 2 tablets (40 mEq total) by mouth daily.  . simvastatin (ZOCOR) 20 MG tablet Take 1 tablet (20 mg total) by mouth daily at 6 PM.  . warfarin (COUMADIN) 10 MG tablet Pt takes 10 mg mon, wed,  fri and 5mg  all other days    FAMILY HISTORY:  His indicated that his mother is alive. He indicated that his father is deceased. He indicated that the status of his other is unknown.   SOCIAL HISTORY: He  reports that he quit smoking about 12 years ago. His smoking use included cigarettes. He has a 7.50 pack-year smoking history. He does not have any smokeless tobacco history on file. He reports that he does not drink alcohol or use drugs.  REVIEW OF SYSTEMS:   Unable to review as patient is unresponsive on vent.   SUBJECTIVE:   VITAL SIGNS: BP 107/64   Pulse (!) 104   Temp (!) 96.7 F (35.9 C) (Temporal)   Resp 17   Ht 6\' 3"  (1.905 m)   Wt (!) 154.7 kg (341 lb)   SpO2 100%   BMI 42.62 kg/m   HEMODYNAMICS:    VENTILATOR SETTINGS: Vent Mode: PRVC FiO2 (%):  [100 %] 100 % Set Rate:  [20 bmp] 20 bmp Vt Set:  [600 mL] 600 mL PEEP:  [5 cmH20] 5 cmH20 Plateau Pressure:  [21 cmH20] 21 cmH20  INTAKE / OUTPUT: No intake/output data recorded.  PHYSICAL EXAMINATION: General:  Morbidly Obese Male, on vent  Neuro:   HEENT: Previous removal of right eye, left pupil sluggish, does not withdrawal, +cough/gag  Cardiovascular:  Loletha Grayer, no MRG  Lungs:  Distant breath sounds, no wheeze/crackles  Abdomen:  Obese, active bowel sounds  Musculoskeletal:  +2 BLE edema  Skin:  Dry, Intact   LABS:  BMET Recent Labs  Lab 02/14/2017 0248 02/12/2017 0321  NA 135 140  K 5.1 5.2*  CL 107 109  CO2 14*  --   BUN 50* 50*  CREATININE 4.45* 4.30*  GLUCOSE 257* 250*    Electrolytes Recent Labs  Lab 02/13/2017 0248 02/06/2017 0307  CALCIUM 8.1*  --   MG  --  2.2    CBC Recent Labs  Lab 02/09/2017 0248 01/25/2017 0321  WBC 6.1  --   HGB 10.4* 11.2*  HCT 33.6* 33.0*  PLT 169  --     Coag's Recent Labs  Lab 01/16/2017 0248  APTT 28    Sepsis Markers Recent Labs  Lab 02/11/2017 0321 02/14/2017 0430  LATICACIDVEN 5.49* 3.18*    ABG Recent Labs  Lab 02/03/2017 0320  PHART  7.113*  PCO2ART 51.8*  PO2ART 208.0*    Liver Enzymes Recent Labs  Lab 01/23/2017 0248  AST 47*  ALT 37  ALKPHOS 88  BILITOT 0.6  ALBUMIN 3.2*    Cardiac Enzymes Recent Labs  Lab 01/23/2017 0248  TROPONINI <0.03    Glucose Recent Labs  Lab 02/02/2017 0241  GLUCAP 193*    Imaging Dg Chest Portable 1 View  Result Date: 01/26/2017 CLINICAL DATA:  ETT placement EXAM: PORTABLE CHEST 1 VIEW COMPARISON:  01/31/2017, 12/02/2013 FINDINGS: Tracheostomy tube tip projects at the thoracic inlet. Improved aeration of the left thorax. Cardiomegaly with possible small left  effusion. Left lower lobe atelectasis or pneumonia. Mild opacity in the right upper lobe better seen. Aortic atherosclerosis. Enlarged mediastinal contour, augmented by portable technique. IMPRESSION: 1. Tip of tracheostomy tube projects over thoracic inlet 2. Improved aeration of the left upper lobe 3. Moderate to marked cardiomegaly, suspect slight increase in size allowing for portable technique, could be due to multi chamber enlargement versus pericardial effusion. 4. Probable small left effusion. Atelectasis or infiltrates at the left lung base and right upper lobe. 5. Enlarged mediastinal contour, this is augmented by portable technique. Electronically Signed   By: Donavan Foil M.D.   On: 02/09/2017 03:27   Dg Chest Portable 1 View  Result Date: 01/24/2017 CLINICAL DATA:  Post CPR, respiratory failure EXAM: PORTABLE CHEST 1 VIEW COMPARISON:  12/02/2013 radiograph FINDINGS: Suspected tracheal tube but the tip is poorly visible, possibly in the region of carina or right mainstem bronchus. Diffuse opacity of the left thorax. Right lung grossly clear. Obscured cardiomediastinal silhouette IMPRESSION: 1. Diffuse opacity of the left thorax may be secondary to large effusion and/or underlying diffuse consolidation. 2. Suspect presence of a tracheal tube but the tip is poorly visible, possibly projecting over the carina or proximal  right mainstem bronchus. Electronically Signed   By: Donavan Foil M.D.   On: 01/29/2017 03:02     STUDIES:  CXR 1/12 > . Diffuse opacity of the left thorax may be secondary to large effusion and/or underlying diffuse consolidation. 2. Suspect presence of a tracheal tube but the tip is poorly visible, possibly projecting over the carina or proximal right mainstem bronchus. CXR 1/12 > Tip of tracheostomy tube projects over thoracic inlet 2. Improved aeration of the left upper lobe 3. Moderate to marked cardiomegaly, suspect slight increase in size allowing for portable technique, could be due to multi chamber enlargement versus pericardial effusion. 4. Probable small left effusion. Atelectasis or infiltrates at the left lung base and right upper lobe. 5. Enlarged mediastinal contour, this is augmented by portable Technique. CT Head 1/12 > No acute intracranial pathology. Mild age-related atrophy and chronic microvascular ischemic Changes. CT Chest 1/12 > Patchy consolidative changes of the apical region may represent pneumonia or pulmonary contusion. 2. Nondisplaced fracture of the sternal body as well as minimally displaced fractures of the anterior ribs bilaterally, possibly related to CPR.   CULTURES: Blood 1/12 > Sputum 1/12 > U/A 1/12 > RVP/Flu 1/12 >    ANTIBIOTICS: Vancomycin 1/12 >  Zosyn 1/12 >   SIGNIFICANT EVENTS: 1/12 > Presents to ED   LINES/TUBES: ETT 1/12 >> RFemoral CVC 1/12 >> RAline 1/12 >>   DISCUSSION: 72 year old male presents s/p Cardiac Arrest after being found with tracheostomy dislodged.   ASSESSMENT / PLAN:  PULMONARY A: Acute Hypercarbic/Hypoxic Respiratory Failure, concern for mucus plugging in setting of CAP vs possible PE > on coumadin INR 1.20 vs tracheostomy malfunction  CXR > Diffuse opacity of left thorax  H/O OHS s/p Tracheostomy, Vent Dependent at HS   P:   Vent Support Trend ABG/CXR VAB Bundle   CARDIOVASCULAR A:   Asystole Cardiac Arrest, estimated 25 minutes down  Diastolic HF (P3AS, EF 50-53 2015)  H/O HTN, HLD, PE on Coumadin   P:  Cardiac Monitoring Levophed to maintain MAP >70 (Currently not on pressors)  Trend Troponin  ECHO pending Heparin gtt  36 Hypothermia Protocol   RENAL A:   Anion Gap Metabolic Acidosis in setting of Lactic Acidosis  LA 5.49 > 3.18 >  Acute on  CKD (Last Known Crt 1.59 in 2015)  Crt 4.45 >  P:   Trend BMP BID  Replace electrolytes as indicated   GASTROINTESTINAL A:   SUP Morbid Obesity  P:   NPO  PPI   HEMATOLOGIC A:   On Chronic Anticoagulation  P:  Trend CBC  Trend INR  Heparin gtt as above   INFECTIOUS A:   Suspected CAP  CXR with diffuse opacity of left thorax  Afebrile, WBC 6.1  P:   Trend PCT and LA Trend WBC and Fever Curve Follow Culture Data  RVP/Flu pending  Continue Vancomycin and Zosyn  Step P. Pending  ENDOCRINE A:   DM   P:   Trend Glucose  SSI  NEUROLOGIC A:   Metabolic Encephalopathy vs Anoxic Injury  P:   RASS goal: 0/-1 PRN Fentanyl/Versed EEG pending   FAMILY  - Updates: Family updated in emergency department   - Inter-disciplinary family meet or Palliative Care meeting due by: 02/04/2016  CC Time: 15 minutes    Hayden Pedro, AGACNP-BC [facility]  Pgr: [phone]  PCCM Pgr: [phone]

## 2017-01-27 NOTE — ED Notes (Signed)
Reject x1,  Other phleb will try to collect.

## 2017-01-28 ENCOUNTER — Inpatient Hospital Stay (HOSPITAL_COMMUNITY): Payer: Medicare Other

## 2017-01-28 DIAGNOSIS — G931 Anoxic brain damage, not elsewhere classified: Secondary | ICD-10-CM

## 2017-01-28 DIAGNOSIS — I10 Essential (primary) hypertension: Secondary | ICD-10-CM

## 2017-01-28 LAB — GLUCOSE, CAPILLARY
GLUCOSE-CAPILLARY: 167 mg/dL — AB (ref 65–99)
GLUCOSE-CAPILLARY: 177 mg/dL — AB (ref 65–99)
Glucose-Capillary: 145 mg/dL — ABNORMAL HIGH (ref 65–99)
Glucose-Capillary: 163 mg/dL — ABNORMAL HIGH (ref 65–99)
Glucose-Capillary: 172 mg/dL — ABNORMAL HIGH (ref 65–99)
Glucose-Capillary: 173 mg/dL — ABNORMAL HIGH (ref 65–99)

## 2017-01-28 LAB — BLOOD CULTURE ID PANEL (REFLEXED)
Acinetobacter baumannii: NOT DETECTED
CANDIDA GLABRATA: NOT DETECTED
CANDIDA PARAPSILOSIS: NOT DETECTED
Candida albicans: NOT DETECTED
Candida krusei: NOT DETECTED
Candida tropicalis: NOT DETECTED
ENTEROBACTER CLOACAE COMPLEX: NOT DETECTED
ESCHERICHIA COLI: NOT DETECTED
Enterobacteriaceae species: NOT DETECTED
Enterococcus species: NOT DETECTED
Haemophilus influenzae: NOT DETECTED
KLEBSIELLA OXYTOCA: NOT DETECTED
Klebsiella pneumoniae: NOT DETECTED
Listeria monocytogenes: NOT DETECTED
Methicillin resistance: DETECTED — AB
Neisseria meningitidis: NOT DETECTED
PROTEUS SPECIES: NOT DETECTED
PSEUDOMONAS AERUGINOSA: NOT DETECTED
SERRATIA MARCESCENS: NOT DETECTED
STAPHYLOCOCCUS AUREUS BCID: NOT DETECTED
STREPTOCOCCUS AGALACTIAE: NOT DETECTED
STREPTOCOCCUS PNEUMONIAE: NOT DETECTED
Staphylococcus species: DETECTED — AB
Streptococcus pyogenes: NOT DETECTED
Streptococcus species: NOT DETECTED

## 2017-01-28 LAB — BASIC METABOLIC PANEL
ANION GAP: 13 (ref 5–15)
ANION GAP: 15 (ref 5–15)
BUN: 75 mg/dL — AB (ref 6–20)
BUN: 85 mg/dL — ABNORMAL HIGH (ref 6–20)
CALCIUM: 6.6 mg/dL — AB (ref 8.9–10.3)
CO2: 20 mmol/L — ABNORMAL LOW (ref 22–32)
CO2: 22 mmol/L (ref 22–32)
Calcium: 7 mg/dL — ABNORMAL LOW (ref 8.9–10.3)
Chloride: 108 mmol/L (ref 101–111)
Chloride: 106 mmol/L (ref 101–111)
Creatinine, Ser: 5.19 mg/dL — ABNORMAL HIGH (ref 0.61–1.24)
Creatinine, Ser: 6.01 mg/dL — ABNORMAL HIGH (ref 0.61–1.24)
GFR, EST AFRICAN AMERICAN: 12 mL/min — AB (ref >60)
GFR, EST AFRICAN AMERICAN: 10 mL/min — AB (ref >60)
GFR, EST NON AFRICAN AMERICAN: 10 mL/min — AB (ref >60)
GFR, EST NON AFRICAN AMERICAN: 8 mL/min — AB (ref >60)
Glucose, Bld: 178 mg/dL — ABNORMAL HIGH (ref 65–99)
Glucose, Bld: 165 mg/dL — ABNORMAL HIGH (ref 65–99)
POTASSIUM: 4.1 mmol/L (ref 3.5–5.1)
Potassium: 3.6 mmol/L (ref 3.5–5.1)
SODIUM: 141 mmol/L (ref 135–145)
Sodium: 143 mmol/L (ref 135–145)

## 2017-01-28 LAB — POCT I-STAT 3, ART BLOOD GAS (G3+)
ACID-BASE DEFICIT: 6 mmol/L — AB (ref 0.0–2.0)
Bicarbonate: 21 mmol/L (ref 20.0–28.0)
O2 Saturation: 99 %
TCO2: 22 mmol/L (ref 22–32)
pCO2 arterial: 43.7 mmHg (ref 32.0–48.0)
pH, Arterial: 7.283 — ABNORMAL LOW (ref 7.350–7.450)
pO2, Arterial: 183 mmHg — ABNORMAL HIGH (ref 83.0–108.0)

## 2017-01-28 LAB — MRSA CULTURE: Culture: NOT DETECTED

## 2017-01-28 LAB — CBC
HCT: 30.1 % — ABNORMAL LOW (ref 39.0–52.0)
Hemoglobin: 9.9 g/dL — ABNORMAL LOW (ref 13.0–17.0)
MCH: 27.9 pg (ref 26.0–34.0)
MCHC: 32.9 g/dL (ref 30.0–36.0)
MCV: 84.8 fL (ref 78.0–100.0)
PLATELETS: 123 10*3/uL — AB (ref 150–400)
RBC: 3.55 MIL/uL — AB (ref 4.22–5.81)
RDW: 15.9 % — ABNORMAL HIGH (ref 11.5–15.5)
WBC: 9.3 10*3/uL (ref 4.0–10.5)

## 2017-01-28 LAB — PROCALCITONIN: PROCALCITONIN: 16.35 ng/mL

## 2017-01-28 LAB — HEPARIN LEVEL (UNFRACTIONATED)
HEPARIN UNFRACTIONATED: 0.9 [IU]/mL — AB (ref 0.30–0.70)
Heparin Unfractionated: 0.66 IU/mL (ref 0.30–0.70)

## 2017-01-28 LAB — PHOSPHORUS: PHOSPHORUS: 3.7 mg/dL (ref 2.5–4.6)

## 2017-01-28 LAB — MAGNESIUM: MAGNESIUM: 1.8 mg/dL (ref 1.7–2.4)

## 2017-01-28 MED ORDER — LORAZEPAM 2 MG/ML IJ SOLN
2.0000 mg | Freq: Once | INTRAMUSCULAR | Status: AC
  Administered 2017-01-28: 2 mg via INTRAVENOUS

## 2017-01-28 MED ORDER — VALPROATE SODIUM 500 MG/5ML IV SOLN
750.0000 mg | Freq: Three times a day (TID) | INTRAVENOUS | Status: DC
  Administered 2017-01-28 – 2017-01-29 (×3): 750 mg via INTRAVENOUS
  Filled 2017-01-28 (×5): qty 7.5

## 2017-01-28 MED ORDER — LORAZEPAM 2 MG/ML IJ SOLN
INTRAMUSCULAR | Status: AC
  Filled 2017-01-28: qty 1

## 2017-01-28 MED ORDER — VALPROATE SODIUM 500 MG/5ML IV SOLN
2500.0000 mg | Freq: Once | INTRAVENOUS | Status: AC
  Administered 2017-01-28: 2500 mg via INTRAVENOUS
  Filled 2017-01-28: qty 25

## 2017-01-28 NOTE — Procedures (Signed)
History: 72 year old male status post cardiac arrest  Sedation: None  Technique: This is a 21 channel routine scalp EEG performed at the bedside with bipolar and monopolar montages arranged in accordance to the international 10/20 system of electrode placement. One channel was dedicated to EKG recording.    Background: There is prominent glossal kinetic artifact throughout the study.  There is generalized irregular delta and and to a lesser degree theta activity throughout the study as well, this is frontally predominant, possibly seen slightly better on the left than right but really generalized.  There are occasional brief less than 1 second periods of suppression.  There is no definite posterior dominant rhythm seen.  Photic stimulation: Physiologic driving is not performed  EEG Abnormalities: 1) generalized irregular slow activity 2) absent PDR  Clinical Interpretation: This EEG is consistent with a generalized nonspecific cerebral dysfunction (encephalopathy). There was no seizure or seizure predisposition recorded on this study. Please note that a normal EEG does not preclude the possibility of epilepsy.   Roland Rack, MD [facility] [phone]  If 7pm- 7am, please page neurology on call as listed in [place].

## 2017-01-28 NOTE — Progress Notes (Signed)
ANTICOAGULATION CONSULT NOTE  Pharmacy Consult for heparin Indication: history of VTE  No Known Allergies  Patient Measurements: Height: 6\' 3"  (190.5 cm) Weight: (!) 393 lb 4.8 oz (178.4 kg) IBW/kg (Calculated) : 84.5 Heparin Dosing Weight: 120 kg  Vital Signs: Temp: 96.6 F (35.9 C) (01/13 0500) Temp Source: Bladder (01/13 0400) BP: 136/67 (01/13 0500) Pulse Rate: 77 (01/13 0500)  Labs: Recent Labs    01/29/2017 0248 02/01/2017 0321 02/06/2017 0422 01/16/2017 0559 02/08/2017 1054 01/16/2017 1159 02/11/2017 1616 02/15/2017 2118 01/28/17 0416  HGB 10.4* 11.2*  --  10.9*  --   --   --   --  9.9*  HCT 33.6* 33.0*  --  32.0*  --   --   --   --  30.1*  PLT 169  --   --   --   --   --   --   --  123*  APTT 28  --   --   --   --   --   --  194*  --   LABPROT  --   --  15.1  --   --   --   --  17.9*  --   INR  --   --  1.20  --   --   --   --  1.49  --   HEPARINUNFRC  --   --   --   --   --  0.53  --   --  0.90*  CREATININE 4.45* 4.30* 4.48*  --   --   --  4.67*  --   --   TROPONINI <0.03  --  <0.03  --  0.06*  --  0.08* 0.08*  --     Estimated Creatinine Clearance: 25.4 mL/min (A) (by C-G formula based on SCr of 4.67 mg/dL (H)).  Assessment: 72 yo male on warfarin pta for history of PE. Admitted with INR 1.2  Remains on heparin gtt while acutely ill S/p cardiac arrest now on hypothermic protocol  Which is likely causing heparin levels to fluctuate. Plts 123  Goal of Therapy:  Heparin level 0.3-0.7 units/ml Monitor platelets by anticoagulation protocol: Yes   Plan:  Reduce heparin to 1400 units/hr Daily HL, CBC Level in 8 hours  [name]  01/28/2017 5:47 AM

## 2017-01-28 NOTE — Progress Notes (Signed)
ANTICOAGULATION CONSULT NOTE  Pharmacy Consult for heparin Indication: history of VTE  No Known Allergies  Patient Measurements: Height: 6\' 3"  (190.5 cm) Weight: (!) 393 lb 4.8 oz (178.4 kg) IBW/kg (Calculated) : 84.5 Heparin Dosing Weight: 120 kg  Vital Signs: Temp: 98.6 F (37 C) (01/13 1500) Temp Source: Bladder (01/13 1500) BP: 133/76 (01/13 1500) Pulse Rate: 79 (01/13 1500)  Labs: Recent Labs    02/14/2017 0248 02/07/2017 0321 01/29/2017 0422 01/22/2017 0559 02/04/2017 1054 02/12/2017 1159 01/28/2017 1616 02/07/2017 2118 01/28/17 0416 01/28/17 1405  HGB 10.4* 11.2*  --  10.9*  --   --   --   --  9.9*  --   HCT 33.6* 33.0*  --  32.0*  --   --   --   --  30.1*  --   PLT 169  --   --   --   --   --   --   --  123*  --   APTT 28  --   --   --   --   --   --  194*  --   --   LABPROT  --   --  15.1  --   --   --   --  17.9*  --   --   INR  --   --  1.20  --   --   --   --  1.49  --   --   HEPARINUNFRC  --   --   --   --   --  0.53  --   --  0.90* 0.66  CREATININE 4.45* 4.30* 4.48*  --   --   --  4.67*  --  5.19*  --   TROPONINI <0.03  --  <0.03  --  0.06*  --  0.08* 0.08*  --   --     Estimated Creatinine Clearance: 22.9 mL/min (A) (by C-G formula based on SCr of 5.19 mg/dL (H)).  Assessment: 72 yo male on warfarin pta for history of PE. Admitted with INR 1.2  Remains on heparin gtt while acutely ill S/p cardiac arrest now on hypothermic protocol  Which is likely causing heparin levels to fluctuate.  Heparin drip 1400 uts/hr HL 0.66 at goal.   Plts 123  Goal of Therapy:  Heparin level 0.3-0.7 units/ml Monitor platelets by anticoagulation protocol: Yes   Plan:  continue heparin  1400 units/hr Daily HL, CBC   Bonnita Nasuti Pharm.D. CPP, BCPS Clinical Pharmacist [phone] 01/28/2017 3:29 PM

## 2017-01-28 NOTE — Consult Note (Signed)
Neurology Consultation Reason for Consult: Concern for anoxic brain injury Referring Physician: Arelia Longest  CC: Altered Mental Status  History is obtained from: chart review  HPI: Zaydin Billey is a 72 y.o. male with a history of multiple medical problems who sufffered an out of hospital cardiac arrest with 19 minute downtime.   Myoclonic activity was reported in the ED.   Since admission, he has had frequent recurrent sterotyped mouth "puckering" movements. Neurology has been asked to evaluate these movements as well as assist with prognosis.    ROS:  Unable to obtain due to altered mental status.   Past Medical History:  Diagnosis Date  . ALCOHOL ABUSE, IN REMISSION, HX OF 08/26/2008  . ANEMIA 08/31/2008  . ANXIETY 07/23/2006  . ASTHMA 07/23/2006  . 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 CARCINOMA, PROSTATE 09/29/2009  . CHF (congestive heart failure) ([place])    With a preserved EF  . 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 DM 05/23/2007  . 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 DM retinopathy ([place])   . ED (erectile dysfunction)   . HYPERCHOLESTEROLEMIA 05/23/2007  . HYPERTENSION 07/23/2006  . Morbid obesity ([place])   . OSTEOARTHRITIS 07/23/2006  . Palpitations 02/10/2008  . Pulmonary embolism ([place]) 03/16/2010  . Shortness of breath   . Sleep apnea   . TOBACCO ABUSE, HX OF 08/26/2008     Family History  Problem Relation Age of Onset  . Diabetes Mellitus II Father   . CAD Father   . Cancer Other      Social History:  reports that he quit smoking about 12 years ago. His smoking use included cigarettes. He has a 7.50 pack-year smoking history. He does not have any smokeless tobacco history on file. He reports that he does not drink alcohol or use drugs.   Exam: Current vital signs: BP (!) 147/79   Pulse 79   Temp (!) 97.5 F (36.4 C) (Bladder)   Resp 20   Ht 6\' 3"  (1.905 m)   Wt (!)  178.4 kg (393 lb 4.8 oz)   SpO2 100%   BMI 49.16 kg/m  Vital signs in last 24 hours: Temp:  [96.3 F (35.7 C)-97.9 F (36.6 C)] 97.5 F (36.4 C) (01/13 1300) Pulse Rate:  [71-93] 79 (01/13 1300) Resp:  [16-28] 20 (01/13 1300) BP: (124-156)/(58-87) 147/79 (01/13 1300) SpO2:  [100 %] 100 % (01/13 1300) Arterial Line BP: (146-183)/(65-82) 169/73 (01/13 1300) FiO2 (%):  [40 %] 40 % (01/13 1207) Weight:  [178.4 kg (393 lb 4.8 oz)] 178.4 kg (393 lb 4.8 oz) (01/13 0400)   Physical Exam  Constitutional: Appears obese Psych: unresponsive Eyes: No scleral injection HENT: ET tube in place.  Head: Normocephalic.  Cardiovascular: Normal rate and regular rhythm.  Respiratory: ventilated GI: Soft.  No distension. There is no tenderness.  Skin: WDI  Neuro: Mental Status: Patient is unresponsive, does not open eyes or follow commands.  Cranial Nerves: II: Enucleated right eye, left eye with reaction. III,IV, VI: Difficult to test because of positioning V, VII: Corneal on the left intact Motor: Tone is flaccid, no reaction to noxious stimuli in any extremity Sensory: As above Deep Tendon Reflexes: 2+ and symmetric in the biceps and patellae.  Plantars: Toes are downgoing bilaterally.  Cerebellar: Does not perform   I have reviewed labs in epic and the results pertinent to this  consultation are: Elevated creatinine  I have reviewed the images obtained: CT head-unremarkable on admission  Impression: 72 year old male status post prolonged cardiac arrest with encephalopathy and abnormal movements.  I am uncertain of the exact nature of these movements. EEG without definite seizure.  I think I would try treating his myoclonus/seizure with Ativan and Depakote see if it calms it down.  Would not pursue aggressively given lack of epileptiform discharges on EEG.  Though I think that it is too early to speak to prognosis, I do have serious concerns.  Recommendations: 1) Ativan 2 mg  x 1,  may repeat x1 2) Depakote 2.5 g followed by 750 mg twice daily 3) repeat CT in the morning 4) neurology will continue to follow   Roland Rack, MD [facility] [phone]  If 7pm- 7am, please page neurology on call as listed in [place].

## 2017-01-28 NOTE — Plan of Care (Signed)
Monitoring labs and vitals closely. Heparin gtt for VTE prophylaxis. Q2hr turns and PROM. Tube feeds at goal for adequate nutrition.

## 2017-01-28 NOTE — Progress Notes (Signed)
PHARMACY - PHYSICIAN COMMUNICATION CRITICAL VALUE ALERT - BLOOD CULTURE IDENTIFICATION (BCID)  Travis Chapman is an 72 y.o. male who presented to [facility] on 02/07/2017   Name of physician (or Provider) Contacted: Nelda Marseille  Current antibiotics: Vanc zosyn  Results for orders placed or performed during the hospital encounter of 01/17/2017  Blood Culture ID Panel (Reflexed) (Collected: 02/13/2017  3:15 AM)  Result Value Ref Range   Enterococcus species NOT DETECTED NOT DETECTED   Listeria monocytogenes NOT DETECTED NOT DETECTED   Staphylococcus species DETECTED (A) NOT DETECTED   Staphylococcus aureus NOT DETECTED NOT DETECTED   Methicillin resistance DETECTED (A) NOT DETECTED   Streptococcus species NOT DETECTED NOT DETECTED   Streptococcus agalactiae NOT DETECTED NOT DETECTED   Streptococcus pneumoniae NOT DETECTED NOT DETECTED   Streptococcus pyogenes NOT DETECTED NOT DETECTED   Acinetobacter baumannii NOT DETECTED NOT DETECTED   Enterobacteriaceae species NOT DETECTED NOT DETECTED   Enterobacter cloacae complex NOT DETECTED NOT DETECTED   Escherichia coli NOT DETECTED NOT DETECTED   Klebsiella oxytoca NOT DETECTED NOT DETECTED   Klebsiella pneumoniae NOT DETECTED NOT DETECTED   Proteus species NOT DETECTED NOT DETECTED   Serratia marcescens NOT DETECTED NOT DETECTED   Haemophilus influenzae NOT DETECTED NOT DETECTED   Neisseria meningitidis NOT DETECTED NOT DETECTED   Pseudomonas aeruginosa NOT DETECTED NOT DETECTED   Candida albicans NOT DETECTED NOT DETECTED   Candida glabrata NOT DETECTED NOT DETECTED   Candida krusei NOT DETECTED NOT DETECTED   Candida parapsilosis NOT DETECTED NOT DETECTED   Candida tropicalis NOT DETECTED NOT DETECTED   Levester Fresh, PharmD, BCPS, BCCCP Clinical Pharmacist Clinical phone for 01/28/2017 from 7a-3:30p: [phone] If after 3:30p, please call main pharmacy at: [phone] 01/28/2017 2:29 PM

## 2017-01-28 NOTE — Progress Notes (Signed)
PULMONARY / CRITICAL CARE MEDICINE   Name: Travis Chapman MRN: [id number] DOB: [birth year]    ADMISSION DATE:  02/15/2017 CONSULTATION DATE:  01/31/2017  REFERRING MD:  Dr. Leonides Schanz   CHIEF COMPLAINT:  Cardiac Arrest   HISTORY OF PRESENT ILLNESS:   72 year old male with PMH of ETOH/Tobacco Abuse, HTN, HLD, DM, OHS s/p Tracheostomy, Vent Dependent at HS, H/O PE, Diastolic HF, CKD   Presents to ED on 1/12 s/p Cardiac Arrest. Reportedly patient activated life alert. Upon arrival EMS reports that patient was sitting on couch with tracheostomy out, found to be asystole. CPR for 19 minutes before ROSC. During that time he had one episode of V.Fib, Defib x 1 and given 300 mg amiodarone. In route additional 3 minutes down. Upon arrival to ED patient had a brief PEA arrest for 3 minutes. Patient presented with 6.0 ETT inserted in stoma, exchanged for cuffed Shiley, unsuccessful placement of #8, #6 placed.   In ED patient remains unresponsive with myoclonic activity. Family confirms Full Code Status.    SUBJECTIVE:  No events overnight, remains completely unresponsive  VITAL SIGNS: BP (!) 150/80   Pulse 77   Temp (!) 97 F (36.1 C) (Bladder)   Resp 20   Ht 6\' 3"  (1.905 m)   Wt (!) 178.4 kg (393 lb 4.8 oz)   SpO2 100%   BMI 49.16 kg/m   HEMODYNAMICS: CVP:  [8 mmHg-19 mmHg] 9 mmHg  VENTILATOR SETTINGS: Vent Mode: PRVC FiO2 (%):  [40 %-50 %] 40 % Set Rate:  [20 bmp] 20 bmp Vt Set:  [600 mL] 600 mL PEEP:  [5 cmH20] 5 cmH20 Plateau Pressure:  [20 cmH20-23 cmH20] 23 cmH20  INTAKE / OUTPUT: I/O last 3 completed shifts: In: 9658.2 [I.V.:5546.9; NG/GT:1211.3; IV Piggyback:2900] Out: 1245 [Urine:1505; Emesis/NG output:50; Other:400; Stool:500]  PHYSICAL EXAMINATION: General:  Morbidly obese male, on vent,  Neuro:  Unresponsive off sedation, not following commands HEENT: Previous removal of right eye, left pupil sluggish, does not withdrawal, no cough/gag, Cardiovascular:  RRR, Nl S1/S2 and  -M/R/G Lungs:  Coarse BS diffusely Abdomen:  Soft, NT, ND and +BS Musculoskeletal:  +2 BLE edema  Skin:  Dry, Intact   LABS:  BMET Recent Labs  Lab 01/29/2017 0422 02/05/2017 0559 02/01/2017 1616 01/28/17 0416  NA 136 141 140 141  K 6.4* 5.4* 4.6 4.1  CL 108  --  111 108  CO2 15*  --  16* 20*  BUN 54*  --  65* 75*  CREATININE 4.48*  --  4.67* 5.19*  GLUCOSE 271* 299* 162* 178*   Electrolytes Recent Labs  Lab 02/14/2017 0307 02/05/2017 0422 01/20/2017 1616 01/28/17 0416  CALCIUM  --  7.7* 7.4* 7.0*  MG 2.2  --   --  1.8  PHOS  --   --   --  3.7   CBC Recent Labs  Lab 01/26/2017 0248 02/11/2017 0321 02/05/2017 0559 01/28/17 0416  WBC 6.1  --   --  9.3  HGB 10.4* 11.2* 10.9* 9.9*  HCT 33.6* 33.0* 32.0* 30.1*  PLT 169  --   --  123*   Coag's Recent Labs  Lab 02/02/2017 0248 01/29/2017 0422 02/07/2017 2118  APTT 28  --  194*  INR  --  1.20 1.49   Sepsis Markers Recent Labs  Lab 02/04/2017 0422 01/24/2017 0423 02/07/2017 0430 02/04/2017 0746 01/28/17 0416  LATICACIDVEN  --  3.0* 3.18* 2.2*  --   PROCALCITON <0.10  --   --   --  16.35   ABG Recent Labs  Lab 02/01/2017 0320 01/31/2017 0605 01/28/17 0427  PHART 7.113* 7.257* 7.283*  PCO2ART 51.8* 34.9 43.7  PO2ART 208.0* 264* 183.0*    Liver Enzymes Recent Labs  Lab 01/24/2017 0248  AST 47*  ALT 37  ALKPHOS 88  BILITOT 0.6  ALBUMIN 3.2*    Cardiac Enzymes Recent Labs  Lab 01/23/2017 1054 02/06/2017 1616 01/20/2017 2118  TROPONINI 0.06* 0.08* 0.08*    Glucose Recent Labs  Lab 01/22/2017 0241 01/26/2017 0834 02/03/2017 1205 02/06/2017 1625 01/19/2017 1959 01/28/17 0822  GLUCAP 193* 282* 237* 151* 156* 177*    Imaging US Renal  Result Date: 02/12/2017 CLINICAL DATA:  Acute kidney injury EXAM: RENAL / URINARY TRACT ULTRASOUND COMPLETE COMPARISON:  02/24/2016 FINDINGS: Right Kidney: Length: 12.1 cm. Cortical thinning and increased echotexture. Lower pole not well visualized due to overlying bowel gas. 1.5 cm cyst. No  hydronephrosis. Left Kidney: Length: 12.1 cm. Diffuse cortical thinning and increased echotexture. No visible mass or hydronephrosis. Bladder: Not visualized IMPRESSION: No hydronephrosis. Increased echotexture and cortical thinning bilaterally compatible with chronic medical renal disease. Electronically Signed   By: Rolm Baptise M.D.   On: 01/19/2017 11:06   Dg Chest Port 1 View  Result Date: 01/28/2017 CLINICAL DATA:  Intubated EXAM: PORTABLE CHEST 1 VIEW COMPARISON:  02/06/2017 FINDINGS: Tracheostomy and NG tube are unchanged. Cardiomegaly with vascular congestion. Bibasilar atelectasis. Node definite visible significant effusions. Mild interstitial prominence could reflect interstitial edema. IMPRESSION: Cardiomegaly with vascular congestion and possible mild interstitial edema. Bibasilar atelectasis. Electronically Signed   By: Rolm Baptise M.D.   On: 01/28/2017 07:20     STUDIES:  CXR 1/12 > . Diffuse opacity of the left thorax may be secondary to large effusion and/or underlying diffuse consolidation. 2. Suspect presence of a tracheal tube but the tip is poorly visible, possibly projecting over the carina or proximal right mainstem bronchus. CXR 1/12 > Tip of tracheostomy tube projects over thoracic inlet 2. Improved aeration of the left upper lobe 3. Moderate to marked cardiomegaly, suspect slight increase in size allowing for portable technique, could be due to multi chamber enlargement versus pericardial effusion. 4. Probable small left effusion. Atelectasis or infiltrates at the left lung base and right upper lobe. 5. Enlarged mediastinal contour, this is augmented by portable Technique. CT Head 1/12 > No acute intracranial pathology. Mild age-related atrophy and chronic microvascular ischemic Changes. CT Chest 1/12 > Patchy consolidative changes of the apical region may represent pneumonia or pulmonary contusion. 2. Nondisplaced fracture of the sternal body as well as  minimally displaced fractures of the anterior ribs bilaterally, possibly related to CPR.   CULTURES: Blood 1/12 > Sputum 1/12 > U/A 1/12 > RVP/Flu 1/12 >    ANTIBIOTICS: Vancomycin 1/12 >  Zosyn 1/12 >   SIGNIFICANT EVENTS: 1/12 > Presents to ED   LINES/TUBES: ETT 1/12 >> RFemoral CVC 1/12 >> RAline 1/12 >>   DISCUSSION: 72 year old male presents s/p Cardiac Arrest after being found with tracheostomy dislodged.   ASSESSMENT / PLAN:  PULMONARY A: Acute Hypercarbic/Hypoxic Respiratory Failure, concern for mucus plugging in setting of CAP vs possible PE > on coumadin INR 1.20 vs tracheostomy malfunction  CXR > Diffuse opacity of left thorax  H/O OHS s/p Tracheostomy, Vent Dependent at HS   P:   Continue full vent support given neuro status Trend ABG/CXR VAB Bundle  Adjust vent for ABG Titrate O2 for sat of 88-92%  CARDIOVASCULAR A:  Asystole Cardiac Arrest,  estimated 25 minutes down  Diastolic HF (W0JW, EF 11-91 2015)  H/O HTN, HLD, PE on Coumadin   P:  Cardiac Monitoring Levophed to maintain MAP>70 Trend Troponin  ECHO 60-65% EF with grade I diastolic dysfunction Heparin gtt  36 Hypothermia Protocol   RENAL A:   Anion Gap Metabolic Acidosis in setting of Lactic Acidosis  LA 5.49 > 3.18 >  Acute on CKD (Last Known Crt 1.59 in 2015)  Crt 4.45 >  P:   Trend BMP BID  Replace electrolytes as indicated   GASTROINTESTINAL A:   SUP Morbid Obesity  P:   NPO  PPI  TF per nutrition  HEMATOLOGIC A:   On Chronic Anticoagulation  P:  Trend CBC  Heparin gtt as above  INR is 1.2, heparin drip  INFECTIOUS A:   Suspected CAP  CXR with diffuse opacity of left thorax  Afebrile, WBC 6.1  P:   Trend PCT and LA Trend WBC and Fever Curve Follow Culture Data  RVP/Flu pending  Continue Vancomycin and Zosyn   ENDOCRINE A:   DM   P:   Trend Glucose  SSI  NEUROLOGIC A:   Metabolic Encephalopathy vs Anoxic Injury  P:   RASS goal: 0/-1 PRN  Fentanyl/Versed EEG pending  Neurology consult called  FAMILY  - Updates: Family updated bedside  - Inter-disciplinary family meet or Palliative Care meeting due by: 02/04/2016  The patient is critically ill with multiple organ systems failure and requires high complexity decision making for assessment and support, frequent evaluation and titration of therapies, application of advanced monitoring technologies and extensive interpretation of multiple databases.   Critical Care Time devoted to patient care services described in this note is  45  Minutes. This time reflects time of care of this signee Dr Jennet Maduro. This critical care time does not reflect procedure time, or teaching time or supervisory time of PA/NP/Med student/Med Resident etc but could involve care discussion time.  Rush Farmer, M.D. [facility] Pulmonary/Critical Care Medicine. Pager: [phone]. After hours pager: [phone].

## 2017-01-28 NOTE — Progress Notes (Signed)
Bedside EEG completed, results pending. 

## 2017-01-29 ENCOUNTER — Inpatient Hospital Stay (HOSPITAL_COMMUNITY): Payer: Medicare Other

## 2017-01-29 DIAGNOSIS — R569 Unspecified convulsions: Secondary | ICD-10-CM

## 2017-01-29 DIAGNOSIS — J9600 Acute respiratory failure, unspecified whether with hypoxia or hypercapnia: Secondary | ICD-10-CM

## 2017-01-29 LAB — GLUCOSE, CAPILLARY
GLUCOSE-CAPILLARY: 151 mg/dL — AB (ref 65–99)
GLUCOSE-CAPILLARY: 139 mg/dL — AB (ref 65–99)
Glucose-Capillary: 146 mg/dL — ABNORMAL HIGH (ref 65–99)

## 2017-01-29 LAB — CBC
HCT: 27.5 % — ABNORMAL LOW (ref 39.0–52.0)
HEMOGLOBIN: 8.9 g/dL — AB (ref 13.0–17.0)
MCH: 27.1 pg (ref 26.0–34.0)
MCHC: 32.4 g/dL (ref 30.0–36.0)
MCV: 83.8 fL (ref 78.0–100.0)
Platelets: 116 10*3/uL — ABNORMAL LOW (ref 150–400)
RBC: 3.28 MIL/uL — AB (ref 4.22–5.81)
RDW: 15.6 % — ABNORMAL HIGH (ref 11.5–15.5)
WBC: 7.2 10*3/uL (ref 4.0–10.5)

## 2017-01-29 LAB — BASIC METABOLIC PANEL
Anion gap: 14 (ref 5–15)
BUN: 91 mg/dL — AB (ref 6–20)
CHLORIDE: 106 mmol/L (ref 101–111)
CO2: 24 mmol/L (ref 22–32)
Calcium: 6.6 mg/dL — ABNORMAL LOW (ref 8.9–10.3)
Creatinine, Ser: 6.16 mg/dL — ABNORMAL HIGH (ref 0.61–1.24)
GFR, EST AFRICAN AMERICAN: 10 mL/min — AB (ref >60)
GFR, EST NON AFRICAN AMERICAN: 8 mL/min — AB (ref >60)
Glucose, Bld: 162 mg/dL — ABNORMAL HIGH (ref 65–99)
POTASSIUM: 3.5 mmol/L (ref 3.5–5.1)
SODIUM: 144 mmol/L (ref 135–145)

## 2017-01-29 LAB — PHOSPHORUS: PHOSPHORUS: 4.3 mg/dL (ref 2.5–4.6)

## 2017-01-29 LAB — POCT I-STAT 3, ART BLOOD GAS (G3+)
Acid-Base Excess: 3 mmol/L — ABNORMAL HIGH (ref 0.0–2.0)
BICARBONATE: 28.6 mmol/L — AB (ref 20.0–28.0)
O2 Saturation: 99 %
TCO2: 30 mmol/L (ref 22–32)
pCO2 arterial: 49.4 mmHg — ABNORMAL HIGH (ref 32.0–48.0)
pH, Arterial: 7.369 (ref 7.350–7.450)
pO2, Arterial: 153 mmHg — ABNORMAL HIGH (ref 83.0–108.0)

## 2017-01-29 LAB — CULTURE, RESPIRATORY: CULTURE: NORMAL

## 2017-01-29 LAB — VANCOMYCIN, RANDOM: Vancomycin Rm: 30

## 2017-01-29 LAB — HEPARIN LEVEL (UNFRACTIONATED): HEPARIN UNFRACTIONATED: 0.3 [IU]/mL (ref 0.30–0.70)

## 2017-01-29 LAB — MAGNESIUM: MAGNESIUM: 1.8 mg/dL (ref 1.7–2.4)

## 2017-01-29 LAB — PROCALCITONIN: PROCALCITONIN: 17.2 ng/mL

## 2017-01-29 LAB — CULTURE, RESPIRATORY W GRAM STAIN

## 2017-01-29 MED ORDER — PIPERACILLIN-TAZOBACTAM IN DEX 2-0.25 GM/50ML IV SOLN
2.2500 g | Freq: Four times a day (QID) | INTRAVENOUS | Status: DC
  Filled 2017-01-29: qty 50

## 2017-01-29 MED ORDER — PIPERACILLIN-TAZOBACTAM IN DEX 2-0.25 GM/50ML IV SOLN
2.2500 g | Freq: Four times a day (QID) | INTRAVENOUS | Status: DC
  Filled 2017-01-29 (×2): qty 50

## 2017-01-29 MED ORDER — MORPHINE BOLUS VIA INFUSION
5.0000 mg | INTRAVENOUS | Status: DC | PRN
  Administered 2017-01-29 (×3): 10 mg via INTRAVENOUS
  Filled 2017-01-29: qty 20

## 2017-01-29 MED ORDER — SODIUM CHLORIDE 0.9 % IV SOLN
10.0000 mg/h | INTRAVENOUS | Status: DC
  Administered 2017-01-29: 10 mg/h via INTRAVENOUS
  Filled 2017-01-29: qty 10

## 2017-01-29 MED FILL — Medication: Qty: 1 | Status: AC

## 2017-01-29 NOTE — Progress Notes (Signed)
Pharmacy Antibiotic Note Travis Chapman is a 72 y.o. male admitted on 02/15/2017 after cardiac arrest with CPR and eventual ROSC. CXR showing opacity on L thorax and pharmacy dosing zosyn and vancomycin for PNA.  BCID shows MRSE. -WBC= 7.2, afebrile, SCr= 6.16 (trend up)   Plan: -Vancomycin random in am to determine need for re-dosing -Change zosyn to 3.375gm IV q8h -Monitor renal fx, cultures   Height: 6\' 3"  (190.5 cm) Weight: (!) 401 lb 3.8 oz (182 kg) IBW/kg (Calculated) : 84.5  Temp (24hrs), Avg:98.4 F (36.9 C), Min:97.5 F (36.4 C), Max:98.8 F (37.1 C)  Recent Labs  Lab 01/17/2017 0248 01/23/2017 0321 02/03/2017 0422 02/04/2017 0423 01/16/2017 0430 01/20/2017 0746 02/11/2017 1616 01/28/17 0416 01/28/17 1822 01/29/17 0350 01/29/17 0456  WBC 6.1  --   --   --   --   --   --  9.3  --  7.2  --   CREATININE 4.45* 4.30* 4.48*  --   --   --  4.67* 5.19* 6.01* 6.16*  --   LATICACIDVEN  --  5.49*  --  3.0* 3.18* 2.2*  --   --   --   --   --   VANCORANDOM  --   --   --   --   --   --   --   --   --   --  30    Estimated Creatinine Clearance: 19.5 mL/min (A) (by C-G formula based on SCr of 6.16 mg/dL (H)).     Antimicrobials this admission: 1/12 vancomycin > 1/12 zosyn >  Dose adjustments this admission: 1/14 VR: 30  Microbiology results: 1/12 blood cx: 2/2 GPC, BCID: MRSE 1/12 resp cx: pending  Hildred Laser, Pharm D 01/29/2017 12:28 PM

## 2017-01-29 NOTE — Progress Notes (Signed)
Pharmacy Antibiotic Note Travis Chapman is a 72 y.o. male admitted on 01/29/2017 after cardiac arrest with CPR and eventual ROSC. CXR showing opacity on L thorax. SCr continues to rise with eCrCl ~ 20 ml/min, still producing urine. Blood cultures are now showing 2/2 with GPC. The BCID returned as MRSE. A vancomycin random level was drawn this morning and returned at 30 - supratherapeutic. Difficult to interpret level given this was only after 2 doses.   Plan: -Hold vancomycin, re-enter maintenance doses based off of renal plans/additional level -Zosyn 2.25 g IV q8h -Monitor renal fx, cultures, VR as needed   Height: 6\' 3"  (190.5 cm) Weight: (!) 401 lb 3.8 oz (182 kg) IBW/kg (Calculated) : 84.5  Temp (24hrs), Avg:98 F (36.7 C), Min:96.8 F (36 C), Max:98.8 F (37.1 C)  Recent Labs  Lab 01/23/2017 0248 01/31/2017 0321 02/13/2017 0422 01/23/2017 0423 01/19/2017 0430 01/21/2017 0746 02/10/2017 1616 01/28/17 0416 01/28/17 1822 01/29/17 0350 01/29/17 0456  WBC 6.1  --   --   --   --   --   --  9.3  --  7.2  --   CREATININE 4.45* 4.30* 4.48*  --   --   --  4.67* 5.19* 6.01* 6.16*  --   LATICACIDVEN  --  5.49*  --  3.0* 3.18* 2.2*  --   --   --   --   --   VANCORANDOM  --   --   --   --   --   --   --   --   --   --  30    Estimated Creatinine Clearance: 19.5 mL/min (A) (by C-G formula based on SCr of 6.16 mg/dL (H)).     Antimicrobials this admission: 1/12 vancomycin > 1/12 zosyn >  Dose adjustments this admission: 1/14 VR: 30  Microbiology results: 1/12 blood cx: 2/2 GPC, BCID: MRSE 1/12 resp cx: pending   Travis Chapman 01/29/2017 5:55 AM

## 2017-01-29 NOTE — Progress Notes (Signed)
Spoke with immediate family at bedside. Requesting that pt be comfort care at this time and prepared for terminal extubation tonight at 2100. Comfort measures ongoing for family and pt.

## 2017-01-29 NOTE — Progress Notes (Signed)
ANTICOAGULATION CONSULT NOTE  Pharmacy Consult for heparin Indication: history of VTE  No Known Allergies  Patient Measurements: Height: 6\' 3"  (190.5 cm) Weight: (!) 401 lb 3.8 oz (182 kg) IBW/kg (Calculated) : 84.5 Heparin Dosing Weight: 120 kg  Vital Signs: Temp: 98.8 F (37.1 C) (01/14 0700) Temp Source: Bladder (01/14 0700) BP: 169/77 (01/14 0742) Pulse Rate: 82 (01/14 0742)  Labs: Recent Labs    01/25/2017 0248  01/18/2017 0422 02/01/2017 0559 01/25/2017 1054  02/07/2017 1616 02/04/2017 2118 01/28/17 0416 01/28/17 1405 01/28/17 1822 01/29/17 0350  HGB 10.4*   < >  --  10.9*  --   --   --   --  9.9*  --   --  8.9*  HCT 33.6*   < >  --  32.0*  --   --   --   --  30.1*  --   --  27.5*  PLT 169  --   --   --   --   --   --   --  123*  --   --  116*  APTT 28  --   --   --   --   --   --  194*  --   --   --   --   LABPROT  --   --  15.1  --   --   --   --  17.9*  --   --   --   --   INR  --   --  1.20  --   --   --   --  1.49  --   --   --   --   HEPARINUNFRC  --   --   --   --   --    < >  --   --  0.90* 0.66  --  0.30  CREATININE 4.45*   < > 4.48*  --   --   --  4.67*  --  5.19*  --  6.01* 6.16*  TROPONINI <0.03  --  <0.03  --  0.06*  --  0.08* 0.08*  --   --   --   --    < > = values in this interval not displayed.    Estimated Creatinine Clearance: 19.5 mL/min (A) (by C-G formula based on SCr of 6.16 mg/dL (H)).  Assessment: 72 yo male on warfarin pta for history of PE. He is s/p cardiac arrest and hypothermia protocol (completed re-warming). Pharmacy dosing heparin -Heparin level = 0.3  Goal of Therapy:  Heparin level 0.3-0.7 units/ml Monitor platelets by anticoagulation protocol: Yes   Plan:  -Increase heparin to 1500 units/hr to keep in range -Daily heparin level and CBC  Hildred Laser, Pharm D 01/29/2017 8:28 AM

## 2017-01-29 NOTE — Progress Notes (Signed)
preparing to withdraw care on patent .  MD aware, respiratory in rout.  Family informed of process and comfort med protocol

## 2017-01-29 NOTE — Procedures (Signed)
Extubation Procedure Note  Patient Details:   Name: Keywon Mestre DOB: 1945/12/14 MRN: [id number]   Airway Documentation:     Evaluation  O2 sats: currently acceptable Complications: No apparent complications Patient did not tolerate procedure well. Bilateral Breath Sounds: Diminished   No   PT terminally extubated per MD order. RN and family at bedside. No complications.   Darryl Nestle F 01/29/2017, 9:28 PM

## 2017-01-29 NOTE — Progress Notes (Signed)
PULMONARY / CRITICAL CARE MEDICINE   Name: Travis Chapman MRN: [id number] DOB: Sep 11, 1945    ADMISSION DATE:  02/15/2017 CONSULTATION DATE:  02/01/2017  REFERRING MD:  Dr. Leonides Schanz   CHIEF COMPLAINT:  Cardiac Arrest   HISTORY OF PRESENT ILLNESS:   72 year old male with PMH of ETOH/Tobacco Abuse, HTN, HLD, DM, OHS s/p Tracheostomy, Vent Dependent at HS, H/O PE, Diastolic HF, CKD   Presents to ED on 1/12 s/p Cardiac Arrest. Reportedly patient activated life alert. Upon arrival EMS reports that patient was sitting on couch with tracheostomy out, found to be asystole. CPR for 19 minutes before ROSC. During that time he had one episode of V.Fib, Defib x 1 and given 300 mg amiodarone. In route additional 3 minutes down. Upon arrival to ED patient had a brief PEA arrest for 3 minutes. Patient presented with 6.0 ETT inserted in stoma, exchanged for cuffed Shiley, unsuccessful placement of #8, #6 placed.   In ED patient remained unresponsive with myoclonic activity. Family confirms Full Code Status.    SUBJECTIVE:  No events overnight.  Some facial/eye jerking per RN. No definite seizure on EEG per neuro. CT head this am with worsening anoxic changes.   VITAL SIGNS: BP (!) 169/77   Pulse 82   Temp 98.8 F (37.1 C) (Bladder)   Resp 20   Ht 6\' 3"  (1.905 m)   Wt (!) 182 kg (401 lb 3.8 oz)   SpO2 100%   BMI 50.15 kg/m   HEMODYNAMICS: CVP:  [4 mmHg-16 mmHg] 16 mmHg  VENTILATOR SETTINGS: Vent Mode: PRVC FiO2 (%):  [40 %] 40 % Set Rate:  [20 bmp] 20 bmp Vt Set:  [600 mL] 600 mL PEEP:  [5 cmH20] 5 cmH20 Plateau Pressure:  [20 cmH20-25 cmH20] 25 cmH20  INTAKE / OUTPUT: I/O last 3 completed shifts: In: 8834.9 [I.V.:5054.9; Other:300; NG/GT:2665; IV Piggyback:815] Out: 76 [Urine:2130; Other:550; Stool:1600]  PHYSICAL EXAMINATION: General:  Morbidly obese male, on vent, unresponsive, mild facial twitching  Neuro:  Unresponsive off sedation, not following commands HEENT: Previous removal of  right eye, left pupil sluggish, does not withdrawal, no cough/gag, Cardiovascular:  RRR, Nl S1/S2 and -M/R/G Lungs:  resps even non labored on vent, not breathing over, Coarse BS diffusely Abdomen:  Soft, NT, ND and +BS Musculoskeletal:  +2 BLE edema  Skin:  Dry, Intact   LABS:  BMET Recent Labs  Lab 01/28/17 0416 01/28/17 1822 01/29/17 0350  NA 141 143 144  K 4.1 3.6 3.5  CL 108 106 106  CO2 20* 22 24  BUN 75* 85* 91*  CREATININE 5.19* 6.01* 6.16*  GLUCOSE 178* 165* 162*   Electrolytes Recent Labs  Lab 02/13/2017 0307  01/28/17 0416 01/28/17 1822 01/29/17 0350  CALCIUM  --    < > 7.0* 6.6* 6.6*  MG 2.2  --  1.8  --  1.8  PHOS  --   --  3.7  --  4.3   < > = values in this interval not displayed.   CBC Recent Labs  Lab 01/23/2017 0248  01/16/2017 0559 01/28/17 0416 01/29/17 0350  WBC 6.1  --   --  9.3 7.2  HGB 10.4*   < > 10.9* 9.9* 8.9*  HCT 33.6*   < > 32.0* 30.1* 27.5*  PLT 169  --   --  123* 116*   < > = values in this interval not displayed.   Coag's Recent Labs  Lab 02/01/2017 0248 01/21/2017 0422 01/29/2017 2118  APTT  28  --  194*  INR  --  1.20 1.49   Sepsis Markers Recent Labs  Lab 01/21/2017 0422 01/31/2017 0423 02/11/2017 0430 01/28/2017 0746 01/28/17 0416 01/29/17 0350  LATICACIDVEN  --  3.0* 3.18* 2.2*  --   --   PROCALCITON <0.10  --   --   --  16.35 17.20   ABG Recent Labs  Lab 02/13/2017 0605 01/28/17 0427 01/29/17 0400  PHART 7.257* 7.283* 7.369  PCO2ART 34.9 43.7 49.4*  PO2ART 264* 183.0* 153.0*    Liver Enzymes Recent Labs  Lab 01/20/2017 0248  AST 47*  ALT 37  ALKPHOS 88  BILITOT 0.6  ALBUMIN 3.2*    Cardiac Enzymes Recent Labs  Lab 01/19/2017 1054 02/12/2017 1616 01/22/2017 2118  TROPONINI 0.06* 0.08* 0.08*    Glucose Recent Labs  Lab 01/28/17 0425 01/28/17 0822 01/28/17 1202 01/28/17 1612 01/28/17 2348 01/29/17 0347  GLUCAP 172* 177* 163* 145* 173* 151*    Imaging Ct Head Wo Contrast  Result Date:  01/29/2017 CLINICAL DATA:  Altered level of consciousness. History of cardiac arrest. Suspected anoxic brain injury. EXAM: CT HEAD WITHOUT CONTRAST TECHNIQUE: Contiguous axial images were obtained from the base of the skull through the vertex without intravenous contrast. COMPARISON:  CT HEAD January 27, 2017 FINDINGS: BRAIN: Indistinct gray-white matter differentiation, new from prior CT. No intraparenchymal hemorrhage, mass effect, midline shift or acute large vascular territory infarcts. Mild white matter changes most compatible with chronic small vessel ischemic disease. Mild sulcal effacement and mass effect on ventricles without hydrocephalus. No abnormal extra-axial fluid collections. Pseudo subarachnoid sign. VASCULAR: Mild calcific atherosclerosis of the carotid siphons. SKULL: No skull fracture. No significant scalp soft tissue swelling. SINUSES/ORBITS: Moderate paranasal sinus mucosal thickening with frontal sinus air-fluid levels. Mastoid air cells are well aerated. Included ocular globes and orbital contents are non-suspicious. OTHER: Life-support lines in place. IMPRESSION: CT findings of hypoxic ischemic encephalopathy, new from prior CT. Acute findings text paged to Dr.Arora, Neurology via [employer] secure system on 01/29/2017 at 2:11 am. Electronically Signed   By: Elon Alas M.D.   On: 01/29/2017 02:11     STUDIES:  CXR 1/12 > . Diffuse opacity of the left thorax may be secondary to large effusion and/or underlying diffuse consolidation. 2. Suspect presence of a tracheal tube but the tip is poorly visible, possibly projecting over the carina or proximal right mainstem bronchus. CXR 1/12 > Tip of tracheostomy tube projects over thoracic inlet 2. Improved aeration of the left upper lobe 3. Moderate to marked cardiomegaly, suspect slight increase in size allowing for portable technique, could be due to multi chamber enlargement versus pericardial effusion. 4. Probable small left  effusion. Atelectasis or infiltrates at the left lung base and right upper lobe. 5. Enlarged mediastinal contour, this is augmented by portable Technique. CT Head 1/12 > No acute intracranial pathology. Mild age-related atrophy and chronic microvascular ischemic Changes. CT Chest 1/12 > Patchy consolidative changes of the apical region may represent pneumonia or pulmonary contusion. 2. Nondisplaced fracture of the sternal body as well as minimally displaced fractures of the anterior ribs bilaterally, possibly related to CPR.  CT head 1/14>>> CT findings of hypoxic ischemic encephalopathy, new from prior CT.  CULTURES: Blood 1/12 >GPC>>> Sputum 1/12 > U/A 1/12 > RVP/Flu 1/12 > NEG    ANTIBIOTICS: Vancomycin 1/12 >  Zosyn 1/12 >   SIGNIFICANT EVENTS: 1/12 > Presents to ED   LINES/TUBES: ETT 1/12 >> R Femoral CVC 1/12 >> R fem Aline  1/12 >>   DISCUSSION: 72 year old male presents s/p Cardiac Arrest after being found with tracheostomy dislodged.   ASSESSMENT / PLAN:  PULMONARY A: Acute Hypercarbic/Hypoxic Respiratory Failure, concern for mucus plugging in setting of CAP vs possible PE > on coumadin INR 1.20 vs tracheostomy malfunction  CXR > Diffuse opacity of left thorax  H/O OHS s/p Tracheostomy, Vent Dependent at HS   P:   Vent support - 8cc/kg  F/u CXR  F/u ABG VAB Bundle  Adjust vent for ABG Titrate O2 for sat of 88-92% No weaning given severe AMS  CARDIOVASCULAR A:  Asystole Cardiac Arrest, estimated 25 minutes down  Diastolic HF (Q5ZD, EF 63-87 2015)  H/O HTN, HLD, PE on Coumadin   P:  Cardiac Monitoring Levophed to maintain MAP>70 Trend Troponin  ECHO 60-65% EF with grade I diastolic dysfunction Heparin gtt per pharmacy  36 Hypothermia Protocol   RENAL A:   Anion Gap Metabolic Acidosis in setting of Lactic Acidosis  LA 5.49 > 3.18 >  Acute on CKD (Last Known Crt 1.59 in 2015)  Crt 4.45 >  P:   Trend BMP BID  Replace electrolytes as indicated    GASTROINTESTINAL A:   SUP Morbid Obesity  P:   NPO  PPI  TF per nutrition  HEMATOLOGIC A:   On Chronic Anticoagulation  P:  Trend CBC  Heparin gtt as above    INFECTIOUS A:   Suspected CAP  CXR with diffuse opacity of left thorax  Afebrile, WBC 6.1  P:   Trend PCT and LA Trend WBC and Fever Curve Follow Culture Data  RVP/Flu neg  Continue Vancomycin and Zosyn   ENDOCRINE A:   DM   P:   Trend Glucose  SSI  NEUROLOGIC A:   Metabolic Encephalopathy vs Anoxic Injury  P:   RASS goal: 0/-1 PRN Fentanyl/Versed EEG pending  Neurology following   FAMILY  - Updates: daughter and girlfriend updated at length bedside 1/14.  Want to complete 72h normothermia and await official neuro prognostication but will likely want to withdrawal in next day or so.   - Inter-disciplinary family meet or Palliative Care meeting due by: 02/04/2016   Nickolas Madrid, NP 01/29/2017  9:43 AM Pager: [phone] or [phone]

## 2017-01-29 NOTE — Progress Notes (Signed)
Note entered in error

## 2017-01-29 NOTE — Progress Notes (Signed)
Pt transported to CT without complications

## 2017-01-29 NOTE — Progress Notes (Signed)
[facility] responded to offer emotional and spiritual support for family at time of extubation; Family Doristine Bosworth is also present;CH will be available as needed. Gwynn Burly 9:11 PM

## 2017-01-29 NOTE — Progress Notes (Signed)
Call from [facility] family member and bedside RN They want to stop needless mediction and  Blood draw now They will do terminal wean at 9pm 01/29/2017 - woujld want sedaton gtt at that time  Plan superfluis orders written TW orders for 9pm done as sign and hold - RN to inform eMD before relasing them  Dr. Brand Males, M.D., [facility].C.P Pulmonary and Critical Care Medicine Staff Physician, [place] Director - Interstitial Lung Disease  Program  Pulmonary [place] at [place], [place], [address]  Pager: [phone], If no answer or between  15:00h - 7:00h: call [phone] Telephone: [phone]

## 2017-01-29 NOTE — Progress Notes (Signed)
Subjective:  Travis Chapman is a 72 y.o. male with a history of multiple medical problems who sufffered an out of hospital cardiac arrest with 19 minute downtime. Myoclonic activity was reported in the ED. Since admission, he has had frequent recurrent sterotyped mouth "puckering" movements. Neurology has been asked to evaluate these movements as well as assist with prognosis.   HE continues to have lip puckering and lid blinking. Intermittently he breaths over vent. Family has had long discussion about what they would prefer after 72 hours if prognosis is grave.   Objective: Current vital signs: BP (!) 147/73   Pulse 81   Temp 98.8 F (37.1 C) (Bladder)   Resp 20   Ht _0  (1.905 m)   Wt (!) 182 kg (401 lb 3.8 oz)   SpO2 100%   BMI 50.15 kg/m  Vital signs in last 24 hours: Temp:  [96.8 F (36 C)-98.8 F (37.1 C)] 98.8 F (37.1 C) (01/14 0700) Pulse Rate:  [75-81] 81 (01/14 0700) Resp:  [0-20] 20 (01/14 0700) BP: (130-154)/(66-87) 147/73 (01/14 0700) SpO2:  [100 %] 100 % (01/14 0700) Arterial Line BP: (143-176)/(62-82) 166/74 (01/14 0700) FiO2 (%):  [40 %] 40 % (01/14 0418) Weight:  [182 kg (401 lb 3.8 oz)] 182 kg (401 lb 3.8 oz) (01/14 0500)  Intake/Output from previous day: 01/13 0701 - 01/14 0700 In: 5807.9 [I.V.:3472.9; NG/GT:1770; IV Piggyback:265] Out: 2720 [Urine:1470; Stool:1100] Intake/Output this shift: No intake/output data recorded. Nutritional status: No diet orders on file  Neurologic Exam: Mental Status: Patient does not respond to verbal stimuli.  Intermittently breaths over vent. Does not respond to deep sternal rub.  Does not follow commands.  No verbalizations noted.  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  V,VII: corneal reflex absent on the left  VIII: patient does not respond to verbal stimuli IX,X: gag reflex present, XI: trapezius  strength unable to test bilaterally XII: tongue strength unable to test Motor: Extremities flaccid throughout.  No spontaneous movement noted.  No purposeful movements noted. Sensory: Does not respond to noxious stimuli in any extremity. Deep Tendon Reflexes:  Absent throughout. Plantars: absent bilaterally Cerebellar: Unable to perform    Lab Results: Results for orders placed or performed during the hospital encounter of 02/13/2017 (from the past 48 hour(s))  Glucose, capillary     Status: Abnormal   Collection Time: 02/01/2017  8:34 AM  Result Value Ref Range   Glucose-Capillary 282 (H) 65 - 99 mg/dL  Culture, respiratory (NON-Expectorated)     Status: None (Preliminary result)   Collection Time: 01/24/2017  8:58 AM  Result Value Ref Range   Specimen Description TRACHEAL ASPIRATE    Special Requests NONE    Gram Stain      MODERATE WBC PRESENT,BOTH PMN AND MONONUCLEAR NO SQUAMOUS EPITHELIAL CELLS SEEN RARE GRAM NEGATIVE RODS RARE GRAM POSITIVE COCCI IN PAIRS    Culture CULTURE REINCUBATED FOR BETTER GROWTH    Report Status PENDING   Troponin I     Status: Abnormal   Collection Time: 02/01/2017 10:54 AM  Result Value Ref Range   Troponin I 0.06 (HH) <0.03 ng/mL    Comment: CRITICAL RESULT CALLED TO, READ BACK BY AND VERIFIED WITH: MILFORD,J RN @ 1208 01/26/2017 LEONARD,A   Heparin level (unfractionated)     Status: None   Collection Time: 02/09/2017 11:59 AM  Result Value Ref Range   Heparin Unfractionated 0.53 0.30 -  0.70 IU/mL    Comment:        IF HEPARIN RESULTS ARE BELOW EXPECTED VALUES, AND PATIENT DOSAGE HAS BEEN CONFIRMED, SUGGEST FOLLOW UP TESTING OF ANTITHROMBIN III LEVELS.   Glucose, capillary     Status: Abnormal   Collection Time: 02/11/2017 12:05 PM  Result Value Ref Range   Glucose-Capillary 237 (H) 65 - 99 mg/dL  Troponin I     Status: Abnormal   Collection Time: 01/18/2017  4:16 PM  Result Value Ref Range   Troponin I 0.08 (HH) <0.03 ng/mL    Comment:  CRITICAL RESULT CALLED TO, READ BACK BY AND VERIFIED WITH: [place] TREY,RN 1754 02/10/2017 THOMPSON V   Basic metabolic panel     Status: Abnormal   Collection Time: 01/29/2017  4:16 PM  Result Value Ref Range   Sodium 140 135 - 145 mmol/L   Potassium 4.6 3.5 - 5.1 mmol/L   Chloride 111 101 - 111 mmol/L   CO2 16 (L) 22 - 32 mmol/L   Glucose, Bld 162 (H) 65 - 99 mg/dL   BUN 65 (H) 6 - 20 mg/dL   Creatinine, Ser 4.67 (H) 0.61 - 1.24 mg/dL   Calcium 7.4 (L) 8.9 - 10.3 mg/dL   GFR calc non Af Amer 12 (L) >60 mL/min   GFR calc Af Amer 13 (L) >60 mL/min    Comment: (NOTE) The eGFR has been calculated using the CKD EPI equation. This calculation has not been validated in all clinical situations. eGFR's persistently <60 mL/min signify possible Chronic Kidney Disease.    Anion gap 13 5 - 15  Glucose, capillary     Status: Abnormal   Collection Time: 02/02/2017  4:25 PM  Result Value Ref Range   Glucose-Capillary 151 (H) 65 - 99 mg/dL  Glucose, capillary     Status: Abnormal   Collection Time: 02/01/2017  7:59 PM  Result Value Ref Range   Glucose-Capillary 156 (H) 65 - 99 mg/dL   Comment 1 Arterial Specimen   Strep pneumoniae urinary antigen     Status: None   Collection Time: 02/09/2017  8:03 PM  Result Value Ref Range   Strep Pneumo Urinary Antigen NEGATIVE NEGATIVE    Comment:        Infection due to S. pneumoniae cannot be absolutely ruled out since the antigen present may be below the detection limit of the test.   Troponin I     Status: Abnormal   Collection Time: 02/11/2017  9:18 PM  Result Value Ref Range   Troponin I 0.08 (HH) <0.03 ng/mL    Comment: CRITICAL VALUE NOTED.  VALUE IS CONSISTENT WITH PREVIOUSLY REPORTED AND CALLED VALUE.  Protime-INR     Status: Abnormal   Collection Time: 02/05/2017  9:18 PM  Result Value Ref Range   Prothrombin Time 17.9 (H) 11.4 - 15.2 seconds   INR 1.49   APTT     Status: Abnormal   Collection Time: 02/03/2017  9:18 PM  Result Value Ref Range    aPTT 194 (HH) 24 - 36 seconds    Comment: REPEATED TO VERIFY SPECIMEN CHECKED FOR CLOTS CRITICAL RESULT CALLED TO, READ BACK BY AND VERIFIED WITHLorraine Lax RN [phone] [id number] SHORTT CHECKED VOLUME AND NO HEMOLYSIS  DELTA CHECK NOTED   Glucose, capillary     Status: Abnormal   Collection Time: 02/05/2017 11:12 PM  Result Value Ref Range   Glucose-Capillary 167 (H) 65 - 99 mg/dL   Comment 1 Arterial Specimen  Procalcitonin     Status: None   Collection Time: 01/28/17  4:16 AM  Result Value Ref Range   Procalcitonin 16.35 ng/mL    Comment:        Interpretation: PCT >= 10 ng/mL: Important systemic inflammatory response, almost exclusively due to severe bacterial sepsis or septic shock. (NOTE)       Sepsis PCT Algorithm           Lower Respiratory Tract                                      Infection PCT Algorithm    ----------------------------     ----------------------------         PCT < 0.25 ng/mL                PCT < 0.10 ng/mL         Strongly encourage             Strongly discourage   discontinuation of antibiotics    initiation of antibiotics    ----------------------------     -----------------------------       PCT 0.25 - 0.50 ng/mL            PCT 0.10 - 0.25 ng/mL               OR       >80% decrease in PCT            Discourage initiation of                                            antibiotics      Encourage discontinuation           of antibiotics    ----------------------------     -----------------------------         PCT >= 0.50 ng/mL              PCT 0.26 - 0.50 ng/mL                AND       <80% decrease in PCT             Encourage initiation of                                             antibiotics       Encourage continuation           of antibiotics    ----------------------------     -----------------------------        PCT >= 0.50 ng/mL                  PCT > 0.50 ng/mL               AND         increase in PCT                  Strongly encourage  initiation of antibiotics    Strongly encourage escalation           of antibiotics                                     -----------------------------                                           PCT <= 0.25 ng/mL                                                 OR                                        > 80% decrease in PCT                                     Discontinue / Do not initiate                                             antibiotics   Heparin level (unfractionated)     Status: Abnormal   Collection Time: 01/28/17  4:16 AM  Result Value Ref Range   Heparin Unfractionated 0.90 (H) 0.30 - 0.70 IU/mL    Comment:        IF HEPARIN RESULTS ARE BELOW EXPECTED VALUES, AND PATIENT DOSAGE HAS BEEN CONFIRMED, SUGGEST FOLLOW UP TESTING OF ANTITHROMBIN III LEVELS.   CBC     Status: Abnormal   Collection Time: 01/28/17  4:16 AM  Result Value Ref Range   WBC 9.3 4.0 - 10.5 K/uL   RBC 3.55 (L) 4.22 - 5.81 MIL/uL   Hemoglobin 9.9 (L) 13.0 - 17.0 g/dL   HCT 30.1 (L) 39.0 - 52.0 %   MCV 84.8 78.0 - 100.0 fL   MCH 27.9 26.0 - 34.0 pg   MCHC 32.9 30.0 - 36.0 g/dL   RDW 15.9 (H) 11.5 - 15.5 %   Platelets 123 (L) 150 - 400 K/uL  Basic metabolic panel     Status: Abnormal   Collection Time: 01/28/17  4:16 AM  Result Value Ref Range   Sodium 141 135 - 145 mmol/L   Potassium 4.1 3.5 - 5.1 mmol/L   Chloride 108 101 - 111 mmol/L   CO2 20 (L) 22 - 32 mmol/L   Glucose, Bld 178 (H) 65 - 99 mg/dL   BUN 75 (H) 6 - 20 mg/dL   Creatinine, Ser 5.19 (H) 0.61 - 1.24 mg/dL   Calcium 7.0 (L) 8.9 - 10.3 mg/dL   GFR calc non Af Amer 10 (L) >60 mL/min   GFR calc Af Amer 12 (L) >60 mL/min    Comment: (NOTE) The eGFR has been calculated using the CKD EPI equation. This calculation has not been validated in all clinical situations. eGFR's persistently <60 mL/min signify possible Chronic Kidney Disease.  Anion gap 13 5 - 15  Magnesium     Status: None   Collection  Time: 01/28/17  4:16 AM  Result Value Ref Range   Magnesium 1.8 1.7 - 2.4 mg/dL  Phosphorus     Status: None   Collection Time: 01/28/17  4:16 AM  Result Value Ref Range   Phosphorus 3.7 2.5 - 4.6 mg/dL  Glucose, capillary     Status: Abnormal   Collection Time: 01/28/17  4:25 AM  Result Value Ref Range   Glucose-Capillary 172 (H) 65 - 99 mg/dL   Comment 1 Arterial Specimen   I-STAT 3, arterial blood gas (G3+)     Status: Abnormal   Collection Time: 01/28/17  4:27 AM  Result Value Ref Range   pH, Arterial 7.283 (L) 7.350 - 7.450   pCO2 arterial 43.7 32.0 - 48.0 mmHg   pO2, Arterial 183.0 (H) 83.0 - 108.0 mmHg   Bicarbonate 21.0 20.0 - 28.0 mmol/L   TCO2 22 22 - 32 mmol/L   O2 Saturation 99.0 %   Acid-base deficit 6.0 (H) 0.0 - 2.0 mmol/L   Patient temperature 96.4 F    Collection site ARTERIAL LINE    Drawn by Operator    Sample type ARTERIAL   Glucose, capillary     Status: Abnormal   Collection Time: 01/28/17  8:22 AM  Result Value Ref Range   Glucose-Capillary 177 (H) 65 - 99 mg/dL   Comment 1 Notify RN   Glucose, capillary     Status: Abnormal   Collection Time: 01/28/17 12:02 PM  Result Value Ref Range   Glucose-Capillary 163 (H) 65 - 99 mg/dL   Comment 1 Document in Chart   Heparin level (unfractionated)     Status: None   Collection Time: 01/28/17  2:05 PM  Result Value Ref Range   Heparin Unfractionated 0.66 0.30 - 0.70 IU/mL    Comment:        IF HEPARIN RESULTS ARE BELOW EXPECTED VALUES, AND PATIENT DOSAGE HAS BEEN CONFIRMED, SUGGEST FOLLOW UP TESTING OF ANTITHROMBIN III LEVELS.   Glucose, capillary     Status: Abnormal   Collection Time: 01/28/17  4:12 PM  Result Value Ref Range   Glucose-Capillary 145 (H) 65 - 99 mg/dL   Comment 1 Capillary Specimen   Basic metabolic panel     Status: Abnormal   Collection Time: 01/28/17  6:22 PM  Result Value Ref Range   Sodium 143 135 - 145 mmol/L   Potassium 3.6 3.5 - 5.1 mmol/L   Chloride 106 101 - 111 mmol/L    CO2 22 22 - 32 mmol/L   Glucose, Bld 165 (H) 65 - 99 mg/dL   BUN 85 (H) 6 - 20 mg/dL   Creatinine, Ser 6.01 (H) 0.61 - 1.24 mg/dL   Calcium 6.6 (L) 8.9 - 10.3 mg/dL   GFR calc non Af Amer 8 (L) >60 mL/min   GFR calc Af Amer 10 (L) >60 mL/min    Comment: (NOTE) The eGFR has been calculated using the CKD EPI equation. This calculation has not been validated in all clinical situations. eGFR's persistently <60 mL/min signify possible Chronic Kidney Disease.    Anion gap 15 5 - 15  Glucose, capillary     Status: Abnormal   Collection Time: 01/28/17 11:48 PM  Result Value Ref Range   Glucose-Capillary 173 (H) 65 - 99 mg/dL   Comment 1 Capillary Specimen    Comment 2 Notify RN   Glucose, capillary  Status: Abnormal   Collection Time: 01/29/17  3:47 AM  Result Value Ref Range   Glucose-Capillary 151 (H) 65 - 99 mg/dL   Comment 1 Capillary Specimen    Comment 2 Notify RN   Procalcitonin     Status: None   Collection Time: 01/29/17  3:50 AM  Result Value Ref Range   Procalcitonin 17.20 ng/mL    Comment:        Interpretation: PCT >= 10 ng/mL: Important systemic inflammatory response, almost exclusively due to severe bacterial sepsis or septic shock. (NOTE)       Sepsis PCT Algorithm           Lower Respiratory Tract                                      Infection PCT Algorithm    ----------------------------     ----------------------------         PCT < 0.25 ng/mL                PCT < 0.10 ng/mL         Strongly encourage             Strongly discourage   discontinuation of antibiotics    initiation of antibiotics    ----------------------------     -----------------------------       PCT 0.25 - 0.50 ng/mL            PCT 0.10 - 0.25 ng/mL               OR       >80% decrease in PCT            Discourage initiation of                                            antibiotics      Encourage discontinuation           of antibiotics    ----------------------------      -----------------------------         PCT >= 0.50 ng/mL              PCT 0.26 - 0.50 ng/mL                AND       <80% decrease in PCT             Encourage initiation of                                             antibiotics       Encourage continuation           of antibiotics    ----------------------------     -----------------------------        PCT >= 0.50 ng/mL                  PCT > 0.50 ng/mL               AND         increase in PCT  Strongly encourage                                      initiation of antibiotics    Strongly encourage escalation           of antibiotics                                     -----------------------------                                           PCT <= 0.25 ng/mL                                                 OR                                        > 80% decrease in PCT                                     Discontinue / Do not initiate                                             antibiotics   Heparin level (unfractionated)     Status: None   Collection Time: 01/29/17  3:50 AM  Result Value Ref Range   Heparin Unfractionated 0.30 0.30 - 0.70 IU/mL    Comment:        IF HEPARIN RESULTS ARE BELOW EXPECTED VALUES, AND PATIENT DOSAGE HAS BEEN CONFIRMED, SUGGEST FOLLOW UP TESTING OF ANTITHROMBIN III LEVELS.   CBC     Status: Abnormal   Collection Time: 01/29/17  3:50 AM  Result Value Ref Range   WBC 7.2 4.0 - 10.5 K/uL   RBC 3.28 (L) 4.22 - 5.81 MIL/uL   Hemoglobin 8.9 (L) 13.0 - 17.0 g/dL   HCT 27.5 (L) 39.0 - 52.0 %   MCV 83.8 78.0 - 100.0 fL   MCH 27.1 26.0 - 34.0 pg   MCHC 32.4 30.0 - 36.0 g/dL   RDW 15.6 (H) 11.5 - 15.5 %   Platelets 116 (L) 150 - 400 K/uL    Comment: PLATELET COUNT CONFIRMED BY SMEAR  Basic metabolic panel     Status: Abnormal   Collection Time: 01/29/17  3:50 AM  Result Value Ref Range   Sodium 144 135 - 145 mmol/L   Potassium 3.5 3.5 - 5.1 mmol/L   Chloride 106 101 - 111 mmol/L   CO2  24 22 - 32 mmol/L   Glucose, Bld 162 (H) 65 - 99 mg/dL   BUN 91 (H) 6 - 20 mg/dL   Creatinine, Ser 6.16 (H) 0.61 - 1.24 mg/dL   Calcium 6.6 (L) 8.9 - 10.3 mg/dL   GFR calc non Af Amer 8 (L) >60 mL/min  GFR calc Af Amer 10 (L) >60 mL/min    Comment: (NOTE) The eGFR has been calculated using the CKD EPI equation. This calculation has not been validated in all clinical situations. eGFR's persistently <60 mL/min signify possible Chronic Kidney Disease.    Anion gap 14 5 - 15  Magnesium     Status: None   Collection Time: 01/29/17  3:50 AM  Result Value Ref Range   Magnesium 1.8 1.7 - 2.4 mg/dL  Phosphorus     Status: None   Collection Time: 01/29/17  3:50 AM  Result Value Ref Range   Phosphorus 4.3 2.5 - 4.6 mg/dL  I-STAT 3, arterial blood gas (G3+)     Status: Abnormal   Collection Time: 01/29/17  4:00 AM  Result Value Ref Range   pH, Arterial 7.369 7.350 - 7.450   pCO2 arterial 49.4 (H) 32.0 - 48.0 mmHg   pO2, Arterial 153.0 (H) 83.0 - 108.0 mmHg   Bicarbonate 28.6 (H) 20.0 - 28.0 mmol/L   TCO2 30 22 - 32 mmol/L   O2 Saturation 99.0 %   Acid-Base Excess 3.0 (H) 0.0 - 2.0 mmol/L   Patient temperature 36.6 C    Collection site ARTERIAL LINE    Drawn by Operator    Sample type ARTERIAL   Vancomycin, random     Status: None   Collection Time: 01/29/17  4:56 AM  Result Value Ref Range   Vancomycin Rm 30     Comment:        Random Vancomycin therapeutic range is dependent on dosage and time of specimen collection. A peak range is 20.0-40.0 ug/mL A trough range is 5.0-15.0 ug/mL            Recent Results (from the past 240 hour(s))  Blood culture (routine x 2)     Status: None (Preliminary result)   Collection Time: 01/23/2017  3:09 AM  Result Value Ref Range Status   Specimen Description BLOOD RIGHT ARM  Final   Special Requests IN PEDIATRIC BOTTLE Blood Culture adequate volume  Final   Culture  Setup Time   Final    GRAM POSITIVE COCCI IN CLUSTERS IN PEDIATRIC  BOTTLE CRITICAL VALUE NOTED.  VALUE IS CONSISTENT WITH PREVIOUSLY REPORTED AND CALLED VALUE.    Culture GRAM POSITIVE COCCI  Final   Report Status PENDING  Incomplete  Blood culture (routine x 2)     Status: None (Preliminary result)   Collection Time: 01/31/2017  3:15 AM  Result Value Ref Range Status   Specimen Description BLOOD RIGHT FEMORAL ARTERY  Final   Special Requests   Final    BOTTLES DRAWN AEROBIC AND ANAEROBIC Blood Culture adequate volume   Culture  Setup Time   Final    GRAM POSITIVE COCCI AEROBIC BOTTLE ONLY CRITICAL RESULT CALLED TO, READ BACK BY AND VERIFIED WITH: M MACCIA,PHARMD AT 1409 01/28/17 BY L BENFIELD    Culture GRAM POSITIVE COCCI  Final   Report Status PENDING  Incomplete  Blood Culture ID Panel (Reflexed)     Status: Abnormal   Collection Time: 01/28/2017  3:15 AM  Result Value Ref Range Status   Enterococcus species NOT DETECTED NOT DETECTED Final   Listeria monocytogenes NOT DETECTED NOT DETECTED Final   Staphylococcus species DETECTED (A) NOT DETECTED Final    Comment: Methicillin (oxacillin) resistant coagulase negative staphylococcus. Possible blood culture contaminant (unless isolated from more than one blood culture draw or clinical case suggests pathogenicity). No antibiotic treatment is indicated for  blood  culture contaminants. CRITICAL RESULT CALLED TO, READ BACK BY AND VERIFIED WITH: M MACCIA,PHARMD AT 1409 01/28/17 BY L BENFIELD    Staphylococcus aureus NOT DETECTED NOT DETECTED Final   Methicillin resistance DETECTED (A) NOT DETECTED Final    Comment: CRITICAL RESULT CALLED TO, READ BACK BY AND VERIFIED WITH: M MACCIA,PHARMD AT 1409 01/28/17 BY L BENFIELD    Streptococcus species NOT DETECTED NOT DETECTED Final   Streptococcus agalactiae NOT DETECTED NOT DETECTED Final   Streptococcus pneumoniae NOT DETECTED NOT DETECTED Final   Streptococcus pyogenes NOT DETECTED NOT DETECTED Final   Acinetobacter baumannii NOT DETECTED NOT DETECTED Final    Enterobacteriaceae species NOT DETECTED NOT DETECTED Final   Enterobacter cloacae complex NOT DETECTED NOT DETECTED Final   Escherichia coli NOT DETECTED NOT DETECTED Final   Klebsiella oxytoca NOT DETECTED NOT DETECTED Final   Klebsiella pneumoniae NOT DETECTED NOT DETECTED Final   Proteus species NOT DETECTED NOT DETECTED Final   Serratia marcescens NOT DETECTED NOT DETECTED Final   Haemophilus influenzae NOT DETECTED NOT DETECTED Final   Neisseria meningitidis NOT DETECTED NOT DETECTED Final   Pseudomonas aeruginosa NOT DETECTED NOT DETECTED Final   Candida albicans NOT DETECTED NOT DETECTED Final   Candida glabrata NOT DETECTED NOT DETECTED Final   Candida krusei NOT DETECTED NOT DETECTED Final   Candida parapsilosis NOT DETECTED NOT DETECTED Final   Candida tropicalis NOT DETECTED NOT DETECTED Final  MRSA PCR Screening     Status: Abnormal   Collection Time: 01/26/2017  6:39 AM  Result Value Ref Range Status   MRSA by PCR INVALID RESULTS, SPECIMEN SENT FOR CULTURE (A) NEGATIVE Final    Comment: RESULT CALLED TO, READ BACK BY AND VERIFIED WITH: J.MILFORD RN AT 1238 02/15/2017 BY A.DAVIS        The GeneXpert MRSA Assay (FDA approved for NASAL specimens only), is one component of a comprehensive MRSA colonization surveillance program. It is not intended to diagnose MRSA infection nor to guide or monitor treatment for MRSA infections.   MRSA culture     Status: None   Collection Time: 01/21/2017  6:39 AM  Result Value Ref Range Status   Specimen Description NASAL SWAB  Final   Special Requests NONE  Final   Culture NO MRSA DETECTED  Final   Report Status 01/28/2017 FINAL  Final  Respiratory Panel by PCR     Status: None   Collection Time: 02/15/2017  7:33 AM  Result Value Ref Range Status   Adenovirus NOT DETECTED NOT DETECTED Final   Coronavirus 229E NOT DETECTED NOT DETECTED Final   Coronavirus HKU1 NOT DETECTED NOT DETECTED Final   Coronavirus NL63 NOT DETECTED NOT DETECTED  Final   Coronavirus OC43 NOT DETECTED NOT DETECTED Final   Metapneumovirus NOT DETECTED NOT DETECTED Final   Rhinovirus / Enterovirus NOT DETECTED NOT DETECTED Final   Influenza A NOT DETECTED NOT DETECTED Final   Influenza B NOT DETECTED NOT DETECTED Final   Parainfluenza Virus 1 NOT DETECTED NOT DETECTED Final   Parainfluenza Virus 2 NOT DETECTED NOT DETECTED Final   Parainfluenza Virus 3 NOT DETECTED NOT DETECTED Final   Parainfluenza Virus 4 NOT DETECTED NOT DETECTED Final   Respiratory Syncytial Virus NOT DETECTED NOT DETECTED Final   Bordetella pertussis NOT DETECTED NOT DETECTED Final   Chlamydophila pneumoniae NOT DETECTED NOT DETECTED Final   Mycoplasma pneumoniae NOT DETECTED NOT DETECTED Final  Culture, respiratory (NON-Expectorated)     Status: None (Preliminary result)  Collection Time: 01/18/2017  8:58 AM  Result Value Ref Range Status   Specimen Description TRACHEAL ASPIRATE  Final   Special Requests NONE  Final   Gram Stain   Final    MODERATE WBC PRESENT,BOTH PMN AND MONONUCLEAR NO SQUAMOUS EPITHELIAL CELLS SEEN RARE GRAM NEGATIVE RODS RARE GRAM POSITIVE COCCI IN PAIRS    Culture CULTURE REINCUBATED FOR BETTER GROWTH  Final   Report Status PENDING  Incomplete    Lipid Panel No results for input(s): CHOL, TRIG, HDL, CHOLHDL, VLDL, LDLCALC in the last 72 hours.  Studies/Results: Ct Head Wo Contrast  Result Date: 01/29/2017 CLINICAL DATA:  Altered level of consciousness. History of cardiac arrest. Suspected anoxic brain injury. EXAM: CT HEAD WITHOUT CONTRAST TECHNIQUE: Contiguous axial images were obtained from the base of the skull through the vertex without intravenous contrast. COMPARISON:  CT HEAD January 27, 2017 FINDINGS: BRAIN: Indistinct gray-white matter differentiation, new from prior CT. No intraparenchymal hemorrhage, mass effect, midline shift or acute large vascular territory infarcts. Mild white matter changes most compatible with chronic small vessel  ischemic disease. Mild sulcal effacement and mass effect on ventricles without hydrocephalus. No abnormal extra-axial fluid collections. Pseudo subarachnoid sign. VASCULAR: Mild calcific atherosclerosis of the carotid siphons. SKULL: No skull fracture. No significant scalp soft tissue swelling. SINUSES/ORBITS: Moderate paranasal sinus mucosal thickening with frontal sinus air-fluid levels. Mastoid air cells are well aerated. Included ocular globes and orbital contents are non-suspicious. OTHER: Life-support lines in place. IMPRESSION: CT findings of hypoxic ischemic encephalopathy, new from prior CT. Acute findings text paged to Dr.Arora, Neurology via [employer] secure system on 01/29/2017 at 2:11 am. Electronically Signed   By: Elon Alas M.D.   On: 01/29/2017 02:11   US Renal  Result Date: 01/24/2017 CLINICAL DATA:  Acute kidney injury EXAM: RENAL / URINARY TRACT ULTRASOUND COMPLETE COMPARISON:  02/24/2016 FINDINGS: Right Kidney: Length: 12.1 cm. Cortical thinning and increased echotexture. Lower pole not well visualized due to overlying bowel gas. 1.5 cm cyst. No hydronephrosis. Left Kidney: Length: 12.1 cm. Diffuse cortical thinning and increased echotexture. No visible mass or hydronephrosis. Bladder: Not visualized IMPRESSION: No hydronephrosis. Increased echotexture and cortical thinning bilaterally compatible with chronic medical renal disease. Electronically Signed   By: Rolm Baptise M.D.   On: 01/22/2017 11:06   Dg Chest Port 1 View  Result Date: 01/28/2017 CLINICAL DATA:  Intubated EXAM: PORTABLE CHEST 1 VIEW COMPARISON:  02/10/2017 FINDINGS: Tracheostomy and NG tube are unchanged. Cardiomegaly with vascular congestion. Bibasilar atelectasis. Node definite visible significant effusions. Mild interstitial prominence could reflect interstitial edema. IMPRESSION: Cardiomegaly with vascular congestion and possible mild interstitial edema. Bibasilar atelectasis. Electronically Signed   By: Rolm Baptise M.D.   On: 01/28/2017 07:20   Dg Chest Port 1 View  Result Date: 02/07/2017 CLINICAL DATA:  Tracheostomy tube follow-up EXAM: PORTABLE CHEST 1 VIEW COMPARISON:  January 27, 2017 FINDINGS: The tracheostomy tube is in good position. The NG tube terminates below today's film. Continued cardiomegaly and mild pulmonary venous congestion. No other changes. IMPRESSION: Support apparatus as above. Electronically Signed   By: Dorise Bullion III M.D   On: 02/10/2017 09:00   Dg Abd Portable 1v  Result Date: 01/29/2017 CLINICAL DATA:  Evaluate NG tube placement. EXAM: PORTABLE ABDOMEN - 1 VIEW COMPARISON:  None. FINDINGS: Multiple lines and wires are projected over the upper abdomen which limits evaluation. However, the side port of the NG tube appears to be in the distal stomach or proximal duodenum. IMPRESSION: Evaluation is  limited due to overlapping lines and wires. However, the side port of the NG tube appears to be in the region of the distal stomach or proximal duodenum. Electronically Signed   By: Dorise Bullion III M.D   On: 01/17/2017 08:59    Medications:  Scheduled: . chlorhexidine gluconate (MEDLINE KIT)  15 mL Mouth Rinse BID  . Chlorhexidine Gluconate Cloth  6 each Topical Q0600  . feeding supplement (PRO-STAT SUGAR FREE 64)  60 mL Per Tube TID  . feeding supplement (VITAL HIGH PROTEIN)  1,000 mL Per Tube Q24H  . insulin aspart  0-15 Units Subcutaneous Q4H  . mouth rinse  15 mL Mouth Rinse 10 times per day  . pantoprazole (PROTONIX) IV  40 mg Intravenous QHS  . sodium chloride flush  10-40 mL Intracatheter Q12H  . sodium chloride flush  10-40 mL Intracatheter Q12H   Continuous: . heparin 1,400 Units/hr (01/29/17 0700)  . norepinephrine (LEVOPHED) Adult infusion    . piperacillin-tazobactam (ZOSYN)  IV Stopped (01/29/17 0518)  .  sodium bicarbonate (isotonic) infusion in sterile water 125 mL/hr at 01/29/17 0700  . valproate sodium Stopped (01/29/17 0630)    CT head: CT  findings of hypoxic ischemic encephalopathy, new from prior CT.  Assessment: 72 year old male status post prolonged cardiac arrest with encephalopathy and abnormal movements. EEG without definite seizure.  1. 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  2. 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 Would not pursue aggressively given lack of epileptiform discharges on EEG.   3. Prognosis now more likely to be poor given CT head findings.   Recommendations: 1) Continue Depakote 750 mg twice daily 2) MRI brain to confirm CT results 3) Neurology will continue to follow     LOS: 2 days   _0  signed: Dr. Kerney Elbe 01/29/2017  7:59 AM

## 2017-01-30 LAB — CULTURE, BLOOD (ROUTINE X 2)
Special Requests: ADEQUATE
Special Requests: ADEQUATE

## 2017-02-02 ENCOUNTER — Telehealth: Payer: Self-pay | Admitting: Internal Medicine

## 2017-02-02 NOTE — Telephone Encounter (Signed)
On 02/02/17 we received D/C  For this patient for burial from [employer] home. Sent to  Dr. Chase Caller on [address] for signature. PWR

## 2017-02-02 NOTE — Telephone Encounter (Signed)
Called [facility] to pick up this death certificate 3/72/90. PWR

## 2017-02-16 NOTE — Progress Notes (Signed)
Wasted morphine drip, 259ml with [place].

## 2017-02-16 NOTE — Discharge Summary (Signed)
DISCHARGE SUMMARY    Date of admit: 02/06/2017  2:38 AM Date of discharge: 2017/02/11 12:55 AM Length of Stay: 3 days  PCP is System, Pcp Not In   PROBLEM LIST Active Problems:   Respiratory failure ([place])   Cardiac arrest ([place])    SUMMARY Travis Chapman was 72 y.o. 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   has a past surgical history that includes Back surgery; Tracheostomy; and Colonoscopy.   Admitted on 01/17/2017 with above issuesPresents to ED on 1/12 s/p Cardiac Arrest. Reportedly patient activated life alert. Upon arrival EMS reports that patient was sitting on couch with tracheostomy out, found to be asystole. CPR for 19 minutes before ROSC. During that time he had one episode of V.Fib, Defib x 1 and given 300 mg amiodarone. In route additional 3 minutes down. Upon arrival to ED patient had a brief PEA arrest for 3 minutes. Patient presented with 6.0 ETT inserted in stoma, exchanged for cuffed [place], unsuccessful placement of #8, . #6 placed. In ED patient remains unresponsive with myoclonic activity. On 01/28/17 he remained completelyu unresponsive. On 01/29/17 still comatose and myoclonus in lips. Family due to poor prognosi opted for terminal wean. Patient expired 02/11/17   Dr. Brand Males, M.D., F.C.C.P Pulmonary and Critical Care Medicine Staff  Physician, [place] Director - Interstitial Lung Disease  Program  Pulmonary [place] [facility] [place], [place], [address]  Pager: [phone], If no answer or between  15:00h - 7:00h: call [phone] Telephone: [phone]

## 2017-02-16 NOTE — Progress Notes (Signed)
Patients dentures are with him to go to funeral home as requested by family.  Witnessed by [employer].

## 2017-02-16 DEATH — deceased
